# Patient Record
Sex: Female | Born: 1935 | Race: White | Hispanic: No | Marital: Married | Smoking: Never smoker
Health system: Southern US, Community
[De-identification: ages and names within clinical notes are randomized; demographics above are authoritative.]

## PROBLEM LIST (undated history)

## (undated) DIAGNOSIS — I4891 Unspecified atrial fibrillation: Secondary | ICD-10-CM

## (undated) DIAGNOSIS — H40059 Ocular hypertension, unspecified eye: Secondary | ICD-10-CM

## (undated) DIAGNOSIS — I809 Phlebitis and thrombophlebitis of unspecified site: Secondary | ICD-10-CM

## (undated) DIAGNOSIS — I82409 Acute embolism and thrombosis of unspecified deep veins of unspecified lower extremity: Secondary | ICD-10-CM

## (undated) DIAGNOSIS — K5790 Diverticulosis of intestine, part unspecified, without perforation or abscess without bleeding: Secondary | ICD-10-CM

## (undated) DIAGNOSIS — C4490 Unspecified malignant neoplasm of skin, unspecified: Secondary | ICD-10-CM

## (undated) DIAGNOSIS — K589 Irritable bowel syndrome without diarrhea: Secondary | ICD-10-CM

## (undated) DIAGNOSIS — S82892A Other fracture of left lower leg, initial encounter for closed fracture: Secondary | ICD-10-CM

## (undated) DIAGNOSIS — K2970 Gastritis, unspecified, without bleeding: Secondary | ICD-10-CM

## (undated) DIAGNOSIS — I10 Essential (primary) hypertension: Secondary | ICD-10-CM

## (undated) DIAGNOSIS — K219 Gastro-esophageal reflux disease without esophagitis: Secondary | ICD-10-CM

## (undated) DIAGNOSIS — K449 Diaphragmatic hernia without obstruction or gangrene: Secondary | ICD-10-CM

## (undated) DIAGNOSIS — R55 Syncope and collapse: Secondary | ICD-10-CM

## (undated) DIAGNOSIS — E785 Hyperlipidemia, unspecified: Secondary | ICD-10-CM

## (undated) DIAGNOSIS — D151 Benign neoplasm of heart: Secondary | ICD-10-CM

## (undated) DIAGNOSIS — I2510 Atherosclerotic heart disease of native coronary artery without angina pectoris: Secondary | ICD-10-CM

## (undated) DIAGNOSIS — K5792 Diverticulitis of intestine, part unspecified, without perforation or abscess without bleeding: Secondary | ICD-10-CM

## (undated) DIAGNOSIS — N390 Urinary tract infection, site not specified: Secondary | ICD-10-CM

## (undated) HISTORY — DX: Syncope and collapse: R55

## (undated) HISTORY — PX: COLONOSCOPY: SHX5424

## (undated) HISTORY — DX: Ocular hypertension, unspecified eye: H40.059

## (undated) HISTORY — DX: Essential (primary) hypertension: I10

## (undated) HISTORY — PX: CATARACT EXTRACTION W/ INTRAOCULAR LENS IMPLANT: SHX1309

## (undated) HISTORY — DX: Other fracture of left lower leg, initial encounter for closed fracture: S82.892A

## (undated) HISTORY — DX: Benign neoplasm of heart: D15.1

## (undated) HISTORY — DX: Unspecified atrial fibrillation: I48.91

## (undated) HISTORY — DX: Hyperlipidemia, unspecified: E78.5

## (undated) HISTORY — DX: Diaphragmatic hernia without obstruction or gangrene: K44.9

## (undated) HISTORY — DX: Diverticulosis of intestine, part unspecified, without perforation or abscess without bleeding: K57.90

## (undated) HISTORY — DX: Gastro-esophageal reflux disease without esophagitis: K21.9

## (undated) HISTORY — DX: Phlebitis and thrombophlebitis of unspecified site: I80.9

## (undated) HISTORY — DX: Gastritis, unspecified, without bleeding: K29.70

## (undated) HISTORY — PX: CATARACT EXTRACTION: SUR2

## (undated) HISTORY — DX: Unspecified malignant neoplasm of skin, unspecified: C44.90

## (undated) HISTORY — DX: Urinary tract infection, site not specified: N39.0

## (undated) HISTORY — PX: TONSILLECTOMY: SUR1361

## (undated) HISTORY — PX: VARICOSE VEIN SURGERY: SHX832

## (undated) HISTORY — DX: Atherosclerotic heart disease of native coronary artery without angina pectoris: I25.10

## (undated) HISTORY — DX: Diverticulitis of intestine, part unspecified, without perforation or abscess without bleeding: K57.92

## (undated) HISTORY — DX: Irritable bowel syndrome, unspecified: K58.9

---

## 1978-11-22 HISTORY — PX: TOTAL ABDOMINAL HYSTERECTOMY W/ BILATERAL SALPINGOOPHORECTOMY: SHX83

## 1998-09-22 ENCOUNTER — Encounter (INDEPENDENT_AMBULATORY_CARE_PROVIDER_SITE_OTHER): Payer: Self-pay | Admitting: *Deleted

## 1998-10-01 ENCOUNTER — Other Ambulatory Visit: Admission: RE | Admit: 1998-10-01 | Discharge: 1998-10-01 | Payer: Self-pay | Admitting: Obstetrics & Gynecology

## 1998-10-07 ENCOUNTER — Encounter: Payer: Self-pay | Admitting: Emergency Medicine

## 1998-10-07 ENCOUNTER — Emergency Department (HOSPITAL_COMMUNITY): Admission: EM | Admit: 1998-10-07 | Discharge: 1998-10-07 | Payer: Self-pay | Admitting: Emergency Medicine

## 1998-11-22 DIAGNOSIS — I82409 Acute embolism and thrombosis of unspecified deep veins of unspecified lower extremity: Secondary | ICD-10-CM

## 1998-11-22 HISTORY — DX: Acute embolism and thrombosis of unspecified deep veins of unspecified lower extremity: I82.409

## 1999-10-05 ENCOUNTER — Inpatient Hospital Stay (HOSPITAL_COMMUNITY): Admission: EM | Admit: 1999-10-05 | Discharge: 1999-10-08 | Payer: Self-pay | Admitting: Internal Medicine

## 1999-10-05 ENCOUNTER — Encounter: Payer: Self-pay | Admitting: Internal Medicine

## 1999-10-06 ENCOUNTER — Encounter: Payer: Self-pay | Admitting: Internal Medicine

## 1999-10-09 ENCOUNTER — Ambulatory Visit (HOSPITAL_COMMUNITY): Admission: RE | Admit: 1999-10-09 | Discharge: 1999-10-09 | Payer: Self-pay | Admitting: *Deleted

## 1999-10-09 ENCOUNTER — Encounter: Payer: Self-pay | Admitting: *Deleted

## 2000-08-08 ENCOUNTER — Other Ambulatory Visit: Admission: RE | Admit: 2000-08-08 | Discharge: 2000-08-08 | Payer: Self-pay | Admitting: Obstetrics and Gynecology

## 2000-11-22 DIAGNOSIS — K5792 Diverticulitis of intestine, part unspecified, without perforation or abscess without bleeding: Secondary | ICD-10-CM

## 2000-11-22 HISTORY — DX: Diverticulitis of intestine, part unspecified, without perforation or abscess without bleeding: K57.92

## 2001-01-09 ENCOUNTER — Ambulatory Visit (HOSPITAL_COMMUNITY): Admission: RE | Admit: 2001-01-09 | Discharge: 2001-01-09 | Payer: Self-pay | Admitting: Internal Medicine

## 2001-01-09 ENCOUNTER — Encounter: Payer: Self-pay | Admitting: Internal Medicine

## 2001-04-06 ENCOUNTER — Encounter: Payer: Self-pay | Admitting: Internal Medicine

## 2001-09-20 ENCOUNTER — Other Ambulatory Visit: Admission: RE | Admit: 2001-09-20 | Discharge: 2001-09-20 | Payer: Self-pay | Admitting: Obstetrics and Gynecology

## 2002-10-31 ENCOUNTER — Other Ambulatory Visit: Admission: RE | Admit: 2002-10-31 | Discharge: 2002-10-31 | Payer: Self-pay | Admitting: Obstetrics and Gynecology

## 2003-01-11 ENCOUNTER — Encounter: Admission: RE | Admit: 2003-01-11 | Discharge: 2003-01-11 | Payer: Self-pay | Admitting: Internal Medicine

## 2003-01-11 ENCOUNTER — Encounter: Payer: Self-pay | Admitting: Internal Medicine

## 2004-03-18 ENCOUNTER — Encounter: Admission: RE | Admit: 2004-03-18 | Discharge: 2004-03-18 | Payer: Self-pay | Admitting: Internal Medicine

## 2004-08-04 ENCOUNTER — Emergency Department (HOSPITAL_COMMUNITY): Admission: EM | Admit: 2004-08-04 | Discharge: 2004-08-04 | Payer: Self-pay | Admitting: Emergency Medicine

## 2004-10-02 ENCOUNTER — Ambulatory Visit: Payer: Self-pay | Admitting: Internal Medicine

## 2004-10-05 ENCOUNTER — Ambulatory Visit: Payer: Self-pay | Admitting: Cardiology

## 2004-11-02 ENCOUNTER — Ambulatory Visit: Payer: Self-pay | Admitting: Cardiovascular Disease

## 2004-11-22 DIAGNOSIS — K2970 Gastritis, unspecified, without bleeding: Secondary | ICD-10-CM

## 2004-11-22 HISTORY — DX: Gastritis, unspecified, without bleeding: K29.70

## 2004-11-24 ENCOUNTER — Ambulatory Visit: Payer: Self-pay | Admitting: Internal Medicine

## 2004-11-30 ENCOUNTER — Ambulatory Visit: Payer: Self-pay | Admitting: Cardiology

## 2004-11-30 ENCOUNTER — Ambulatory Visit: Payer: Self-pay | Admitting: Internal Medicine

## 2004-12-03 ENCOUNTER — Ambulatory Visit: Payer: Self-pay | Admitting: Internal Medicine

## 2004-12-30 ENCOUNTER — Ambulatory Visit: Payer: Self-pay | Admitting: Internal Medicine

## 2005-01-13 ENCOUNTER — Ambulatory Visit: Payer: Self-pay | Admitting: Internal Medicine

## 2005-01-14 ENCOUNTER — Ambulatory Visit: Payer: Self-pay | Admitting: Internal Medicine

## 2005-01-15 ENCOUNTER — Ambulatory Visit: Payer: Self-pay | Admitting: Internal Medicine

## 2005-01-27 ENCOUNTER — Ambulatory Visit: Payer: Self-pay | Admitting: Cardiology

## 2005-02-11 ENCOUNTER — Ambulatory Visit: Payer: Self-pay | Admitting: Internal Medicine

## 2005-02-24 ENCOUNTER — Ambulatory Visit: Payer: Self-pay | Admitting: Internal Medicine

## 2005-03-10 ENCOUNTER — Ambulatory Visit: Payer: Self-pay | Admitting: Internal Medicine

## 2005-04-07 ENCOUNTER — Ambulatory Visit: Payer: Self-pay | Admitting: Internal Medicine

## 2005-05-07 ENCOUNTER — Ambulatory Visit: Payer: Self-pay | Admitting: Internal Medicine

## 2005-05-31 ENCOUNTER — Ambulatory Visit: Payer: Self-pay | Admitting: Internal Medicine

## 2005-06-02 ENCOUNTER — Ambulatory Visit: Payer: Self-pay | Admitting: Internal Medicine

## 2005-06-30 ENCOUNTER — Ambulatory Visit: Payer: Self-pay | Admitting: Internal Medicine

## 2005-07-28 ENCOUNTER — Ambulatory Visit: Payer: Self-pay | Admitting: Internal Medicine

## 2005-08-25 ENCOUNTER — Ambulatory Visit: Payer: Self-pay | Admitting: Internal Medicine

## 2005-09-22 ENCOUNTER — Ambulatory Visit: Payer: Self-pay | Admitting: Internal Medicine

## 2005-10-06 ENCOUNTER — Ambulatory Visit: Payer: Self-pay | Admitting: Internal Medicine

## 2005-10-20 ENCOUNTER — Ambulatory Visit: Payer: Self-pay | Admitting: Internal Medicine

## 2005-11-17 ENCOUNTER — Ambulatory Visit: Payer: Self-pay | Admitting: Internal Medicine

## 2005-11-26 ENCOUNTER — Ambulatory Visit: Payer: Self-pay | Admitting: Internal Medicine

## 2005-12-15 ENCOUNTER — Ambulatory Visit: Payer: Self-pay | Admitting: Internal Medicine

## 2005-12-28 ENCOUNTER — Ambulatory Visit: Payer: Self-pay | Admitting: Internal Medicine

## 2006-01-25 ENCOUNTER — Ambulatory Visit: Payer: Self-pay | Admitting: Internal Medicine

## 2006-02-22 ENCOUNTER — Ambulatory Visit: Payer: Self-pay | Admitting: Internal Medicine

## 2006-03-18 ENCOUNTER — Ambulatory Visit: Payer: Self-pay | Admitting: Internal Medicine

## 2006-03-25 ENCOUNTER — Ambulatory Visit: Payer: Self-pay | Admitting: Internal Medicine

## 2006-04-11 ENCOUNTER — Ambulatory Visit: Payer: Self-pay | Admitting: Internal Medicine

## 2006-04-25 ENCOUNTER — Ambulatory Visit: Payer: Self-pay | Admitting: Internal Medicine

## 2006-04-27 ENCOUNTER — Ambulatory Visit: Payer: Self-pay | Admitting: Internal Medicine

## 2006-04-29 ENCOUNTER — Ambulatory Visit: Payer: Self-pay | Admitting: Internal Medicine

## 2006-05-05 ENCOUNTER — Ambulatory Visit: Payer: Self-pay | Admitting: Internal Medicine

## 2006-05-19 ENCOUNTER — Ambulatory Visit: Payer: Self-pay | Admitting: Internal Medicine

## 2006-06-17 ENCOUNTER — Ambulatory Visit: Payer: Self-pay | Admitting: Internal Medicine

## 2006-07-01 ENCOUNTER — Ambulatory Visit: Payer: Self-pay | Admitting: Internal Medicine

## 2006-08-01 ENCOUNTER — Ambulatory Visit: Payer: Self-pay | Admitting: Internal Medicine

## 2006-08-31 ENCOUNTER — Ambulatory Visit: Payer: Self-pay | Admitting: Internal Medicine

## 2006-09-14 ENCOUNTER — Ambulatory Visit: Payer: Self-pay | Admitting: Internal Medicine

## 2006-09-22 ENCOUNTER — Ambulatory Visit: Payer: Self-pay | Admitting: Internal Medicine

## 2006-09-30 ENCOUNTER — Ambulatory Visit: Payer: Self-pay | Admitting: Internal Medicine

## 2006-10-07 ENCOUNTER — Ambulatory Visit: Payer: Self-pay | Admitting: Internal Medicine

## 2006-10-21 ENCOUNTER — Ambulatory Visit: Payer: Self-pay | Admitting: Internal Medicine

## 2006-11-11 ENCOUNTER — Ambulatory Visit: Payer: Self-pay | Admitting: Internal Medicine

## 2006-12-01 ENCOUNTER — Ambulatory Visit: Payer: Self-pay | Admitting: Internal Medicine

## 2006-12-01 DIAGNOSIS — Z8719 Personal history of other diseases of the digestive system: Secondary | ICD-10-CM | POA: Insufficient documentation

## 2006-12-01 DIAGNOSIS — I8000 Phlebitis and thrombophlebitis of superficial vessels of unspecified lower extremity: Secondary | ICD-10-CM | POA: Insufficient documentation

## 2006-12-01 DIAGNOSIS — E7800 Pure hypercholesterolemia, unspecified: Secondary | ICD-10-CM | POA: Insufficient documentation

## 2006-12-01 DIAGNOSIS — I10 Essential (primary) hypertension: Secondary | ICD-10-CM | POA: Insufficient documentation

## 2006-12-01 DIAGNOSIS — Z86718 Personal history of other venous thrombosis and embolism: Secondary | ICD-10-CM | POA: Insufficient documentation

## 2006-12-01 DIAGNOSIS — E785 Hyperlipidemia, unspecified: Secondary | ICD-10-CM | POA: Insufficient documentation

## 2006-12-12 ENCOUNTER — Ambulatory Visit: Payer: Self-pay | Admitting: Internal Medicine

## 2006-12-28 ENCOUNTER — Ambulatory Visit: Payer: Self-pay | Admitting: Internal Medicine

## 2007-01-06 ENCOUNTER — Ambulatory Visit: Payer: Self-pay | Admitting: Internal Medicine

## 2007-01-20 ENCOUNTER — Ambulatory Visit: Payer: Self-pay | Admitting: Internal Medicine

## 2007-02-22 ENCOUNTER — Ambulatory Visit: Payer: Self-pay | Admitting: Internal Medicine

## 2007-02-23 ENCOUNTER — Ambulatory Visit: Payer: Self-pay | Admitting: Internal Medicine

## 2007-02-24 ENCOUNTER — Ambulatory Visit: Payer: Self-pay | Admitting: Internal Medicine

## 2007-02-27 ENCOUNTER — Ambulatory Visit: Payer: Self-pay | Admitting: Internal Medicine

## 2007-03-13 ENCOUNTER — Ambulatory Visit: Payer: Self-pay | Admitting: Internal Medicine

## 2007-04-14 ENCOUNTER — Ambulatory Visit: Payer: Self-pay | Admitting: Internal Medicine

## 2007-04-28 ENCOUNTER — Ambulatory Visit: Payer: Self-pay | Admitting: Internal Medicine

## 2007-04-28 LAB — CONVERTED CEMR LAB
INR range: 2
Prothrombin Time: 20.4 s

## 2007-05-22 ENCOUNTER — Ambulatory Visit: Payer: Self-pay | Admitting: Internal Medicine

## 2007-05-22 LAB — CONVERTED CEMR LAB
INR: 1.8
Prothrombin Time: 16.3 s

## 2007-06-05 ENCOUNTER — Ambulatory Visit: Payer: Self-pay | Admitting: Internal Medicine

## 2007-06-05 LAB — CONVERTED CEMR LAB: Prothrombin Time: 20.7 s

## 2007-06-08 ENCOUNTER — Encounter: Payer: Self-pay | Admitting: Internal Medicine

## 2007-06-13 ENCOUNTER — Ambulatory Visit: Payer: Self-pay | Admitting: Internal Medicine

## 2007-06-13 ENCOUNTER — Encounter: Payer: Self-pay | Admitting: Internal Medicine

## 2007-06-23 ENCOUNTER — Ambulatory Visit: Payer: Self-pay | Admitting: Internal Medicine

## 2007-07-03 ENCOUNTER — Encounter (INDEPENDENT_AMBULATORY_CARE_PROVIDER_SITE_OTHER): Payer: Self-pay | Admitting: *Deleted

## 2007-07-07 ENCOUNTER — Ambulatory Visit: Payer: Self-pay | Admitting: Internal Medicine

## 2007-07-07 LAB — CONVERTED CEMR LAB: Prothrombin Time: 18.5 s

## 2007-07-17 ENCOUNTER — Ambulatory Visit: Payer: Self-pay | Admitting: Internal Medicine

## 2007-07-19 ENCOUNTER — Ambulatory Visit: Payer: Self-pay | Admitting: Internal Medicine

## 2007-07-20 ENCOUNTER — Encounter (INDEPENDENT_AMBULATORY_CARE_PROVIDER_SITE_OTHER): Payer: Self-pay | Admitting: *Deleted

## 2007-07-27 ENCOUNTER — Ambulatory Visit: Payer: Self-pay | Admitting: Internal Medicine

## 2007-08-17 ENCOUNTER — Ambulatory Visit: Payer: Self-pay | Admitting: Internal Medicine

## 2007-08-31 ENCOUNTER — Ambulatory Visit: Payer: Self-pay | Admitting: Internal Medicine

## 2007-08-31 LAB — CONVERTED CEMR LAB: INR: 2.6

## 2007-09-08 ENCOUNTER — Ambulatory Visit: Payer: Self-pay | Admitting: Internal Medicine

## 2007-09-28 ENCOUNTER — Ambulatory Visit: Payer: Self-pay | Admitting: Internal Medicine

## 2007-10-26 ENCOUNTER — Encounter (INDEPENDENT_AMBULATORY_CARE_PROVIDER_SITE_OTHER): Payer: Self-pay | Admitting: *Deleted

## 2007-10-26 ENCOUNTER — Ambulatory Visit: Payer: Self-pay | Admitting: Internal Medicine

## 2007-11-21 ENCOUNTER — Ambulatory Visit: Payer: Self-pay | Admitting: Internal Medicine

## 2007-12-05 ENCOUNTER — Ambulatory Visit: Payer: Self-pay | Admitting: Internal Medicine

## 2007-12-19 ENCOUNTER — Ambulatory Visit: Payer: Self-pay | Admitting: Internal Medicine

## 2008-01-02 ENCOUNTER — Ambulatory Visit: Payer: Self-pay | Admitting: Internal Medicine

## 2008-01-02 LAB — CONVERTED CEMR LAB: INR: 3.4

## 2008-01-16 ENCOUNTER — Ambulatory Visit: Payer: Self-pay | Admitting: Internal Medicine

## 2008-02-02 ENCOUNTER — Ambulatory Visit: Payer: Self-pay | Admitting: Internal Medicine

## 2008-02-20 DIAGNOSIS — M1990 Unspecified osteoarthritis, unspecified site: Secondary | ICD-10-CM | POA: Insufficient documentation

## 2008-02-20 DIAGNOSIS — K219 Gastro-esophageal reflux disease without esophagitis: Secondary | ICD-10-CM | POA: Insufficient documentation

## 2008-02-20 DIAGNOSIS — K589 Irritable bowel syndrome without diarrhea: Secondary | ICD-10-CM | POA: Insufficient documentation

## 2008-02-28 ENCOUNTER — Ambulatory Visit: Payer: Self-pay | Admitting: Internal Medicine

## 2008-02-28 LAB — CONVERTED CEMR LAB: INR: 2.3

## 2008-03-05 ENCOUNTER — Telehealth (INDEPENDENT_AMBULATORY_CARE_PROVIDER_SITE_OTHER): Payer: Self-pay | Admitting: *Deleted

## 2008-03-13 ENCOUNTER — Ambulatory Visit: Payer: Self-pay | Admitting: Internal Medicine

## 2008-03-13 DIAGNOSIS — I8390 Asymptomatic varicose veins of unspecified lower extremity: Secondary | ICD-10-CM | POA: Insufficient documentation

## 2008-03-13 LAB — CONVERTED CEMR LAB: INR: 3.8

## 2008-04-04 ENCOUNTER — Ambulatory Visit: Payer: Self-pay | Admitting: Internal Medicine

## 2008-04-04 LAB — CONVERTED CEMR LAB
INR: 4.6
Prothrombin Time: 26.1 s

## 2008-04-09 ENCOUNTER — Encounter: Payer: Self-pay | Admitting: Internal Medicine

## 2008-04-09 ENCOUNTER — Ambulatory Visit: Payer: Self-pay | Admitting: Vascular Surgery

## 2008-04-17 ENCOUNTER — Ambulatory Visit: Payer: Self-pay | Admitting: Internal Medicine

## 2008-04-17 LAB — CONVERTED CEMR LAB
INR: 1.8
Prothrombin Time: 16.4 s

## 2008-05-15 ENCOUNTER — Ambulatory Visit: Payer: Self-pay | Admitting: Internal Medicine

## 2008-05-15 LAB — CONVERTED CEMR LAB: INR: 3.6

## 2008-06-05 ENCOUNTER — Ambulatory Visit: Payer: Self-pay | Admitting: Internal Medicine

## 2008-06-05 LAB — CONVERTED CEMR LAB: INR: 4.1

## 2008-06-14 ENCOUNTER — Ambulatory Visit: Payer: Self-pay | Admitting: Internal Medicine

## 2008-06-14 LAB — CONVERTED CEMR LAB
INR: 3.1
Rapid Strep: NEGATIVE

## 2008-06-25 ENCOUNTER — Ambulatory Visit: Payer: Self-pay | Admitting: Internal Medicine

## 2008-06-25 ENCOUNTER — Telehealth (INDEPENDENT_AMBULATORY_CARE_PROVIDER_SITE_OTHER): Payer: Self-pay | Admitting: *Deleted

## 2008-06-26 ENCOUNTER — Telehealth: Payer: Self-pay | Admitting: Internal Medicine

## 2008-06-27 ENCOUNTER — Encounter (INDEPENDENT_AMBULATORY_CARE_PROVIDER_SITE_OTHER): Payer: Self-pay | Admitting: *Deleted

## 2008-06-27 LAB — CONVERTED CEMR LAB
Basophils Relative: 0 % (ref 0.0–3.0)
Eosinophils Absolute: 0.1 10*3/uL (ref 0.0–0.7)
HCT: 43.3 % (ref 36.0–46.0)
Hemoglobin: 15.1 g/dL — ABNORMAL HIGH (ref 12.0–15.0)
Lymphocytes Relative: 8.5 % — ABNORMAL LOW (ref 12.0–46.0)
MCV: 87.9 fL (ref 78.0–100.0)
Monocytes Relative: 1.3 % — ABNORMAL LOW (ref 3.0–12.0)
Potassium: 3.9 meq/L (ref 3.5–5.1)
Prothrombin Time: 28.9 s — ABNORMAL HIGH (ref 10.9–13.3)
RBC: 4.93 M/uL (ref 3.87–5.11)

## 2008-07-02 ENCOUNTER — Telehealth: Payer: Self-pay | Admitting: Internal Medicine

## 2008-07-10 ENCOUNTER — Ambulatory Visit: Payer: Self-pay | Admitting: Internal Medicine

## 2008-07-10 LAB — CONVERTED CEMR LAB: INR: 2.9

## 2008-07-17 ENCOUNTER — Encounter: Payer: Self-pay | Admitting: Internal Medicine

## 2008-07-31 ENCOUNTER — Ambulatory Visit: Payer: Self-pay | Admitting: Internal Medicine

## 2008-07-31 LAB — CONVERTED CEMR LAB: INR: 2.7

## 2008-08-29 ENCOUNTER — Ambulatory Visit: Payer: Self-pay | Admitting: Internal Medicine

## 2008-08-29 LAB — CONVERTED CEMR LAB: INR: 2.3

## 2008-09-09 ENCOUNTER — Ambulatory Visit: Payer: Self-pay | Admitting: Internal Medicine

## 2008-09-24 ENCOUNTER — Ambulatory Visit: Payer: Self-pay | Admitting: Internal Medicine

## 2008-09-24 DIAGNOSIS — R011 Cardiac murmur, unspecified: Secondary | ICD-10-CM | POA: Insufficient documentation

## 2008-09-24 LAB — CONVERTED CEMR LAB
INR: 3.7
Prothrombin Time: 23.2 s

## 2008-09-27 ENCOUNTER — Ambulatory Visit: Payer: Self-pay | Admitting: Internal Medicine

## 2008-09-27 LAB — CONVERTED CEMR LAB: INR: 1.2

## 2008-09-30 ENCOUNTER — Telehealth (INDEPENDENT_AMBULATORY_CARE_PROVIDER_SITE_OTHER): Payer: Self-pay | Admitting: *Deleted

## 2008-09-30 ENCOUNTER — Ambulatory Visit: Payer: Self-pay | Admitting: Internal Medicine

## 2008-10-01 ENCOUNTER — Telehealth (INDEPENDENT_AMBULATORY_CARE_PROVIDER_SITE_OTHER): Payer: Self-pay | Admitting: *Deleted

## 2008-10-14 ENCOUNTER — Ambulatory Visit: Payer: Self-pay | Admitting: Vascular Surgery

## 2008-10-31 ENCOUNTER — Ambulatory Visit: Payer: Self-pay | Admitting: Internal Medicine

## 2008-10-31 DIAGNOSIS — K5730 Diverticulosis of large intestine without perforation or abscess without bleeding: Secondary | ICD-10-CM | POA: Insufficient documentation

## 2008-11-06 ENCOUNTER — Encounter (INDEPENDENT_AMBULATORY_CARE_PROVIDER_SITE_OTHER): Payer: Self-pay | Admitting: *Deleted

## 2008-11-25 ENCOUNTER — Ambulatory Visit: Payer: Self-pay | Admitting: Vascular Surgery

## 2008-12-02 ENCOUNTER — Ambulatory Visit: Payer: Self-pay | Admitting: Vascular Surgery

## 2008-12-09 ENCOUNTER — Ambulatory Visit: Payer: Self-pay | Admitting: Internal Medicine

## 2008-12-20 ENCOUNTER — Ambulatory Visit: Payer: Self-pay | Admitting: Family Medicine

## 2008-12-20 ENCOUNTER — Telehealth: Payer: Self-pay | Admitting: Family Medicine

## 2008-12-20 LAB — CONVERTED CEMR LAB: Rapid Strep: NEGATIVE

## 2008-12-24 ENCOUNTER — Telehealth: Payer: Self-pay | Admitting: Family Medicine

## 2009-01-06 ENCOUNTER — Ambulatory Visit: Payer: Self-pay | Admitting: Vascular Surgery

## 2009-01-13 ENCOUNTER — Ambulatory Visit: Payer: Self-pay | Admitting: Internal Medicine

## 2009-01-15 ENCOUNTER — Ambulatory Visit: Payer: Self-pay | Admitting: Vascular Surgery

## 2009-01-27 ENCOUNTER — Ambulatory Visit: Payer: Self-pay | Admitting: Vascular Surgery

## 2009-02-05 ENCOUNTER — Telehealth (INDEPENDENT_AMBULATORY_CARE_PROVIDER_SITE_OTHER): Payer: Self-pay | Admitting: *Deleted

## 2009-03-11 ENCOUNTER — Ambulatory Visit: Payer: Self-pay | Admitting: Internal Medicine

## 2009-03-11 LAB — CONVERTED CEMR LAB
Blood in Urine, dipstick: NEGATIVE
Glucose, Urine, Semiquant: NEGATIVE
Nitrite: POSITIVE
Specific Gravity, Urine: 1.01

## 2009-03-12 ENCOUNTER — Encounter: Payer: Self-pay | Admitting: Internal Medicine

## 2009-03-14 ENCOUNTER — Encounter (INDEPENDENT_AMBULATORY_CARE_PROVIDER_SITE_OTHER): Payer: Self-pay | Admitting: *Deleted

## 2009-04-28 ENCOUNTER — Ambulatory Visit: Payer: Self-pay | Admitting: Vascular Surgery

## 2009-05-27 ENCOUNTER — Ambulatory Visit: Payer: Self-pay | Admitting: Internal Medicine

## 2009-06-11 ENCOUNTER — Telehealth: Payer: Self-pay | Admitting: Internal Medicine

## 2009-06-12 ENCOUNTER — Ambulatory Visit: Payer: Self-pay | Admitting: Internal Medicine

## 2009-06-13 ENCOUNTER — Encounter (INDEPENDENT_AMBULATORY_CARE_PROVIDER_SITE_OTHER): Payer: Self-pay | Admitting: *Deleted

## 2009-08-06 ENCOUNTER — Ambulatory Visit: Payer: Self-pay | Admitting: Internal Medicine

## 2009-08-06 LAB — CONVERTED CEMR LAB
Specific Gravity, Urine: <1.005
pH: 7

## 2009-08-08 ENCOUNTER — Telehealth (INDEPENDENT_AMBULATORY_CARE_PROVIDER_SITE_OTHER): Payer: Self-pay | Admitting: *Deleted

## 2009-09-08 ENCOUNTER — Ambulatory Visit: Payer: Self-pay | Admitting: Internal Medicine

## 2009-09-18 ENCOUNTER — Encounter: Payer: Self-pay | Admitting: Internal Medicine

## 2009-09-18 ENCOUNTER — Telehealth (INDEPENDENT_AMBULATORY_CARE_PROVIDER_SITE_OTHER): Payer: Self-pay | Admitting: *Deleted

## 2009-09-26 ENCOUNTER — Encounter: Payer: Self-pay | Admitting: Internal Medicine

## 2009-09-26 ENCOUNTER — Ambulatory Visit: Payer: Self-pay | Admitting: Internal Medicine

## 2009-10-13 ENCOUNTER — Encounter (INDEPENDENT_AMBULATORY_CARE_PROVIDER_SITE_OTHER): Payer: Self-pay | Admitting: *Deleted

## 2009-11-12 ENCOUNTER — Ambulatory Visit: Payer: Self-pay | Admitting: Internal Medicine

## 2009-11-18 ENCOUNTER — Ambulatory Visit: Payer: Self-pay | Admitting: Internal Medicine

## 2009-11-18 DIAGNOSIS — Z85828 Personal history of other malignant neoplasm of skin: Secondary | ICD-10-CM | POA: Insufficient documentation

## 2009-11-19 ENCOUNTER — Telehealth (INDEPENDENT_AMBULATORY_CARE_PROVIDER_SITE_OTHER): Payer: Self-pay | Admitting: *Deleted

## 2009-11-19 LAB — CONVERTED CEMR LAB
ALT: 21 units/L (ref 0–35)
BUN: 22 mg/dL (ref 6–23)
Bilirubin, Direct: 0 mg/dL (ref 0.0–0.3)
CO2: 30 meq/L (ref 19–32)
Calcium: 10.2 mg/dL (ref 8.4–10.5)
Chloride: 105 meq/L (ref 96–112)
Creatinine, Ser: 0.9 mg/dL (ref 0.4–1.2)
Eosinophils Absolute: 0.1 10*3/uL (ref 0.0–0.7)
Glucose, Bld: 88 mg/dL (ref 70–99)
HCT: 41.7 % (ref 36.0–46.0)
LDL Cholesterol: 105 mg/dL — ABNORMAL HIGH (ref 0–99)
Lymphs Abs: 1.6 10*3/uL (ref 0.7–4.0)
MCV: 91.8 fL (ref 78.0–100.0)
Monocytes Relative: 7.7 % (ref 3.0–12.0)
Potassium: 5 meq/L (ref 3.5–5.1)
Sodium: 144 meq/L (ref 135–145)
TSH: 1.67 microintl units/mL (ref 0.35–5.50)
Total Bilirubin: 0.8 mg/dL (ref 0.3–1.2)
Triglycerides: 54 mg/dL (ref 0.0–149.0)

## 2009-11-20 ENCOUNTER — Encounter (INDEPENDENT_AMBULATORY_CARE_PROVIDER_SITE_OTHER): Payer: Self-pay | Admitting: *Deleted

## 2009-12-08 ENCOUNTER — Ambulatory Visit: Payer: Self-pay | Admitting: Internal Medicine

## 2009-12-11 ENCOUNTER — Ambulatory Visit: Payer: Self-pay | Admitting: Internal Medicine

## 2009-12-11 LAB — CONVERTED CEMR LAB
OCCULT 1: NEGATIVE
OCCULT 2: NEGATIVE

## 2009-12-12 ENCOUNTER — Encounter (INDEPENDENT_AMBULATORY_CARE_PROVIDER_SITE_OTHER): Payer: Self-pay | Admitting: *Deleted

## 2009-12-24 ENCOUNTER — Telehealth: Payer: Self-pay | Admitting: Internal Medicine

## 2009-12-26 ENCOUNTER — Encounter: Payer: Self-pay | Admitting: Internal Medicine

## 2010-02-13 ENCOUNTER — Ambulatory Visit: Payer: Self-pay | Admitting: Family Medicine

## 2010-02-23 ENCOUNTER — Ambulatory Visit: Payer: Self-pay | Admitting: Internal Medicine

## 2010-02-23 ENCOUNTER — Telehealth (INDEPENDENT_AMBULATORY_CARE_PROVIDER_SITE_OTHER): Payer: Self-pay | Admitting: *Deleted

## 2010-02-23 DIAGNOSIS — R490 Dysphonia: Secondary | ICD-10-CM | POA: Insufficient documentation

## 2010-03-02 LAB — CONVERTED CEMR LAB
Basophils Absolute: 0 10*3/uL (ref 0.0–0.1)
Eosinophils Absolute: 0 10*3/uL (ref 0.0–0.7)
Eosinophils Relative: 0.1 % (ref 0.0–5.0)
Hemoglobin: 13 g/dL (ref 12.0–15.0)
Monocytes Absolute: 0.2 10*3/uL (ref 0.1–1.0)
Monocytes Relative: 2.9 % — ABNORMAL LOW (ref 3.0–12.0)
Neutro Abs: 3.7 10*3/uL (ref 1.4–7.7)
RBC: 4.35 M/uL (ref 3.87–5.11)

## 2010-03-26 ENCOUNTER — Ambulatory Visit: Payer: Self-pay | Admitting: Internal Medicine

## 2010-03-26 ENCOUNTER — Encounter (INDEPENDENT_AMBULATORY_CARE_PROVIDER_SITE_OTHER): Payer: Self-pay | Admitting: *Deleted

## 2010-04-16 ENCOUNTER — Ambulatory Visit: Payer: Self-pay | Admitting: Internal Medicine

## 2010-04-17 ENCOUNTER — Encounter: Payer: Self-pay | Admitting: Internal Medicine

## 2010-06-04 ENCOUNTER — Telehealth (INDEPENDENT_AMBULATORY_CARE_PROVIDER_SITE_OTHER): Payer: Self-pay | Admitting: *Deleted

## 2010-06-19 ENCOUNTER — Telehealth (INDEPENDENT_AMBULATORY_CARE_PROVIDER_SITE_OTHER): Payer: Self-pay | Admitting: *Deleted

## 2010-08-14 ENCOUNTER — Ambulatory Visit: Payer: Self-pay | Admitting: Internal Medicine

## 2010-09-02 ENCOUNTER — Ambulatory Visit: Payer: Self-pay | Admitting: Internal Medicine

## 2010-09-02 DIAGNOSIS — M79609 Pain in unspecified limb: Secondary | ICD-10-CM | POA: Insufficient documentation

## 2010-09-04 LAB — CONVERTED CEMR LAB: Uric Acid, Serum: 5.6 mg/dL (ref 2.4–7.0)

## 2010-09-22 ENCOUNTER — Encounter: Payer: Self-pay | Admitting: Internal Medicine

## 2010-10-09 ENCOUNTER — Ambulatory Visit: Payer: Self-pay | Admitting: Internal Medicine

## 2010-10-09 DIAGNOSIS — L0390 Cellulitis, unspecified: Secondary | ICD-10-CM

## 2010-10-09 DIAGNOSIS — L0291 Cutaneous abscess, unspecified: Secondary | ICD-10-CM | POA: Insufficient documentation

## 2010-11-09 ENCOUNTER — Encounter: Payer: Self-pay | Admitting: Internal Medicine

## 2010-11-26 ENCOUNTER — Ambulatory Visit
Admission: RE | Admit: 2010-11-26 | Discharge: 2010-11-26 | Payer: Self-pay | Source: Home / Self Care | Attending: Internal Medicine | Admitting: Internal Medicine

## 2010-11-26 DIAGNOSIS — J069 Acute upper respiratory infection, unspecified: Secondary | ICD-10-CM | POA: Insufficient documentation

## 2010-11-26 LAB — CONVERTED CEMR LAB: Rapid Strep: NEGATIVE

## 2010-12-20 LAB — CONVERTED CEMR LAB
ALT: 20 units/L (ref 0–35)
Albumin: 4.2 g/dL (ref 3.5–5.2)
Alkaline Phosphatase: 62 units/L (ref 39–117)
BUN: 30 mg/dL — ABNORMAL HIGH (ref 6–23)
BUN: 31 mg/dL — ABNORMAL HIGH (ref 6–23)
Basophils Absolute: 0 10*3/uL (ref 0.0–0.1)
Basophils Relative: 0.2 % (ref 0.0–3.0)
CO2: 30 meq/L (ref 19–32)
CO2: 30 meq/L (ref 19–32)
Calcium: 9.6 mg/dL (ref 8.4–10.5)
Calcium: 9.8 mg/dL (ref 8.4–10.5)
Chloride: 100 meq/L (ref 96–112)
Cholesterol, target level: 200 mg/dL
Cholesterol: 228 mg/dL (ref 0–200)
Cholesterol: 233 mg/dL (ref 0–200)
Creatinine, Ser: 0.9 mg/dL (ref 0.4–1.2)
Creatinine, Ser: 1.1 mg/dL (ref 0.4–1.2)
Direct LDL: 122.2 mg/dL
Eosinophils Absolute: 0 10*3/uL (ref 0.0–0.6)
Eosinophils Relative: 1.8 % (ref 0.0–5.0)
GFR calc Af Amer: 80 mL/min
GFR calc non Af Amer: 66 mL/min
Glucose, Bld: 92 mg/dL (ref 70–99)
Glucose, Bld: 94 mg/dL (ref 70–99)
HCT: 40.6 % (ref 36.0–46.0)
LDL Goal: 150 mg/dL
Lymphocytes Relative: 24.2 % (ref 12.0–46.0)
Lymphocytes Relative: 32.1 % (ref 12.0–46.0)
MCHC: 34.4 g/dL (ref 30.0–36.0)
MCV: 88.3 fL (ref 78.0–100.0)
Monocytes Absolute: 0.4 10*3/uL (ref 0.1–1.0)
Monocytes Relative: 6.5 % (ref 3.0–11.0)
Monocytes Relative: 7.6 % (ref 3.0–12.0)
Neutro Abs: 3.2 10*3/uL (ref 1.4–7.7)
Neutro Abs: 3.7 10*3/uL (ref 1.4–7.7)
Neutrophils Relative %: 68.2 % (ref 43.0–77.0)
Platelets: 244 10*3/uL (ref 150–400)
Potassium: 3.5 meq/L (ref 3.5–5.1)
Potassium: 4.2 meq/L (ref 3.5–5.1)
RBC: 4.61 M/uL (ref 3.87–5.11)
Sodium: 139 meq/L (ref 135–145)
Sodium: 141 meq/L (ref 135–145)
TSH: 1.52 microintl units/mL (ref 0.35–5.50)
TSH: 2.18 microintl units/mL (ref 0.35–5.50)
Total Bilirubin: 0.9 mg/dL (ref 0.3–1.2)
Total Bilirubin: 1 mg/dL (ref 0.3–1.2)
Total Protein: 6.9 g/dL (ref 6.0–8.3)
Triglycerides: 66 mg/dL (ref 0–149)
Triglycerides: 75 mg/dL (ref 0–149)
VLDL: 13 mg/dL (ref 0–40)

## 2010-12-22 NOTE — Assessment & Plan Note (Signed)
Summary: head congestion//fd   Vital Signs:  Patient profile:   75 year old female Weight:      160.4 pounds Temp:     98.3 degrees F oral Pulse rate:   60 / minute Resp:     16 per minute BP sitting:   122 / 70  (left arm) Cuff size:   large  Vitals Entered By: Shonna Chock (December 08, 2009 3:29 PM) CC: Head cold off/ on x several weeks: cough,sore throat and drainage Comments REVIEWED MED LIST, PATIENT AGREED DOSE AND INSTRUCTION CORRECT    Primary Care Provider:  Marga Melnick, MD  CC:  Head cold off/ on x several weeks: cough and sore throat and drainage.  History of Present Illness: Chest congestion with N/P cough since generic Ceftin finished. Purulence resolved with that antibiotic. Rx: lozenges. Advair has helped cough in past with respiratory infections. RAD with infections.  Allergies: 1)  ! Fosamax 2)  ! * Altace 3)  ! Oxybutynin Chloride (Oxybutynin Chloride) 4)  ! Darvocet  Review of Systems General:  Denies fever and sweats. ENT:  Denies earache, nasal congestion, and sinus pressure; No facial pain, frontal headache, or purulence. Resp:  Complains of cough and wheezing; denies shortness of breath and sputum productive.  Physical Exam  General:  well-nourished,in no acute distress; alert,appropriate and cooperative throughout examination Ears:  External ear exam shows no significant lesions or deformities.  Otoscopic examination reveals clear canals, tympanic membranes are intact bilaterally without bulging, retraction, inflammation or discharge. Hearing is grossly normal bilaterally. Nose:  External nasal examination shows no deformity or inflammation. Nasal mucosa are pink and moist without lesions or exudates. Mouth:  Oral mucosa and oropharynx without lesions or exudates.  Teeth in good repair. Lungs:  Normal respiratory effort, chest expands symmetrically. Lungs : minor  wheezes. Cervical Nodes:  No lymphadenopathy noted Axillary Nodes:  No palpable  lymphadenopathy   Impression & Recommendations:  Problem # 1:  BRONCHITIS-ACUTE (ICD-466.0)  The following medications were removed from the medication list:    Cefuroxime Axetil 500 Mg Tabs (Cefuroxime axetil) .Marland Kitchen... 1 two times a day Her updated medication list for this problem includes:    Azithromycin 250 Mg Tabs (Azithromycin) .Marland Kitchen... As per pack  Complete Medication List: 1)  Triamterene-hctz 75-50 Mg Tabs (Triamterene-hctz) .... 1/2 once daily 2)  Travatan 0.004 % Soln (Travoprost) .Marland Kitchen.. 1 drop in each eye daily 3)  Fexofenadine Hcl 180 Mg Tabs (Fexofenadine hcl) .... Prn 4)  Omeprazole 20 Mg Tbec (Omeprazole) .Marland Kitchen.. 1 q am 5)  Bayer Aspirin 325 Mg Tabs (Aspirin) .Marland Kitchen.. 1 by mouth once daily 6)  Align Caps (Misc intestinal flora regulat) .Marland Kitchen.. 1 by mouth q d 7)  Levbid 0.375 Mg Xr12h-tab (Hyoscyamine sulfate) .Marland Kitchen.. 1 tablet by mouth two times a day as needed 8)  Losartan Potassium 100 Mg Tabs (Losartan potassium) .Marland Kitchen.. 1 once daily inplace of diovan 9)  Azithromycin 250 Mg Tabs (Azithromycin) .... As per pack  Patient Instructions: 1)  Advair inhalations every 12 hrs. 2)  Drink as much fluid as you can tolerate for the next few days. Prescriptions: AZITHROMYCIN 250 MG TABS (AZITHROMYCIN) as per pack  #1 x 0   Entered and Authorized by:   Marga Melnick MD   Signed by:   Marga Melnick MD on 12/08/2009   Method used:   Printed then faxed to ...       [facility] (retail)       [phone] [facility] [address]  [place], [place]  [address]       Ph: [phone]       Fax: [phone]   RxID:   [id number]

## 2010-12-22 NOTE — Progress Notes (Signed)
Summary: Triage: B/P med concerns-lm 7/14  Phone Note Call from Patient Call back at Home Phone [phone]   Caller: Patient Summary of Call: Message left on VM: Patient calling about B/P med, patient taking Losartan and not doing as well as she was when taking Diovan. Patient with fluid retention in her ankles. Patient is out of Losartan and wonders if Dr.Hopper would allow her to have samples of Diovan and see if it really makes a difference in her symptoms.  Dr.Hopper please advise, and forward to [facility] triage  [facility] CMA  June 04, 2010 11:45 AM   Follow-up for Phone Call        Give 2 weeks of Diovan 160 samples as trial Follow-up by: Marga Melnick MD,  June 04, 2010 1:24 PM  Additional Follow-up for Phone Call Additional follow up Details #1::        Left message on machine to call back to office. Lucious Groves CMA  June 04, 2010 2:27 PM     Additional Follow-up for Phone Call Additional follow up Details #2::    Spoke with patient, patient will pick up samples. Shonna Chock CMA  June 04, 2010 3:28 PM   New/Updated Medications: VITAMIN B-12 100 MCG TABS (CYANOCOBALAMIN) 1 by mouth once daily

## 2010-12-22 NOTE — Progress Notes (Signed)
Summary: med change   Phone Note Call from Patient Call back at Home Phone [phone]   Caller: Patient Call For: Dr. Marina Goodell Reason for Call: Talk to Nurse Summary of Call: Hyomax is no longer covered by pt's ins and she needs something else Initial call taken by: Vallarie Mare,  December 24, 2009 1:13 PM  Follow-up for Phone Call         Pt. will contact insurance to see what med. they will pay for and call back.    Teryl Lucy RN  December 25, 2009 9:27 AM  Med. Gilman Buttner. made by Dr.Perry and faxed to pharmacy per Timoteo Ace. Follow-up by: Teryl Lucy RN,  December 25, 2009 9:27 AM

## 2010-12-22 NOTE — Assessment & Plan Note (Signed)
Summary: bump near elbow/cbs   Vital Signs:  Patient profile:   75 year old female Weight:      174 pounds BMI:     29.06 Temp:     98.6 degrees F oral Pulse rate:   72 / minute BP sitting:   130 / 78  (left arm) Cuff size:   large  Vitals Entered By: Shonna Chock CMA (October 09, 2010 1:55 PM) CC: Red raised area on left elbow , Rash   Primary Care Provider:  Marga Melnick, MD  CC:  Red raised area on left elbow  and Rash.  History of Present Illness:      This is a 75 year old woman who presents with  a  pustule on the left forearm.  It began as a red area with increased temp 11/15. The patient denies the following symptoms: fever, chills or sweats.  The patient denies history of recent tick bite and other insect bite.  No PMH or FH of MRSA. Rx: none  Current Medications (verified): 1)  Latanoprost 0.005 % Soln (Latanoprost) .Marland Kitchen.. 1 Drop in Each Eye Daily 2)  Fexofenadine Hcl 180 Mg  Tabs (Fexofenadine Hcl) .... Prn 3)  Omeprazole 20 Mg Tbec (Omeprazole) .Marland Kitchen.. 1 Q Am 4)  Bayer Aspirin 325 Mg Tabs (Aspirin) .Marland Kitchen.. 1 By Mouth Once Daily 5)  Align  Caps (Misc Intestinal Flora Regulat) .Marland Kitchen.. 1 By Mouth Q D 6)  Losartan Potassium 100 Mg Tabs (Losartan Potassium) .Marland Kitchen.. 1 By Mouth Once Daily 7)  Dicyclomine Hcl 20 Mg Tabs (Dicyclomine Hcl) .Marland Kitchen.. 1 By Mouth Every 6 Hours As Needed 8)  Singulair 10 Mg Tabs (Montelukast Sodium) .Marland Kitchen.. 1 Once Daily 9)  Combivent 18-103 Mcg/act Aero (Ipratropium-Albuterol) .Marland Kitchen.. 1-2 Puffs Every 4-6 Hrs As Needed Cough/ Shortness of Breath 10)  Liquid Coq10 .... 2 Teaspoons Two Times A Day 11)  Furosemide 20 Mg Tabs (Furosemide) .Marland Kitchen.. 1 Once Daily As Needed Swelling  Allergies: 1)  ! Fosamax 2)  ! * Altace 3)  ! Oxybutynin Chloride (Oxybutynin Chloride) 4)  ! Darvocet  Review of Systems GI:  Denies abdominal pain, nausea, and vomiting. MS:  She still has pain @ base of R great toe in sole.  Physical Exam  General:  well-nourished,in no acute distress;  alert,appropriate and cooperative throughout examination Skin:  20 X 20 mm  cellulitis , 10X 7 papule with central minute pustule  distal to elbow Cervical Nodes:  No lymphadenopathy noted Axillary Nodes:  No palpable lymphadenopathy   Impression & Recommendations:  Problem # 1:  CELLULITIS AND ABSCESS OF UNSPECIFIED SITE (ICD-682.9)  Her updated medication list for this problem includes:    Cephalexin 500 Mg Caps (Cephalexin) .Marland Kitchen... 1 two times a day  Orders: Prescription Created Electronically [phone])  Problem # 2:  TOE PAIN (ICD-729.5)  Orders: Podiatry Referral (Podiatry)  Complete Medication List: 1)  Latanoprost 0.005 % Soln (Latanoprost) .Marland Kitchen.. 1 drop in each eye daily 2)  Fexofenadine Hcl 180 Mg Tabs (Fexofenadine hcl) .... Prn 3)  Omeprazole 20 Mg Tbec (Omeprazole) .Marland Kitchen.. 1 q am 4)  Bayer Aspirin 325 Mg Tabs (Aspirin) .Marland Kitchen.. 1 by mouth once daily 5)  Align Caps (Misc intestinal flora regulat) .Marland Kitchen.. 1 by mouth q d 6)  Losartan Potassium 100 Mg Tabs (Losartan potassium) .Marland Kitchen.. 1 by mouth once daily 7)  Dicyclomine Hcl 20 Mg Tabs (Dicyclomine hcl) .Marland Kitchen.. 1 by mouth every 6 hours as needed 8)  Singulair 10 Mg Tabs (Montelukast sodium) .Marland KitchenMarland KitchenMarland Kitchen 1  once daily 9)  Combivent 18-103 Mcg/act Aero (Ipratropium-albuterol) .Marland Kitchen.. 1-2 puffs every 4-6 hrs as needed cough/ shortness of breath 10)  Liquid Coq10  .... 2 teaspoons two times a day 11)  Furosemide 20 Mg Tabs (Furosemide) .Marland Kitchen.. 1 once daily as needed swelling 12)  Cephalexin 500 Mg Caps (Cephalexin) .Marland Kitchen.. 1 two times a day 13)  Mupirocin 2 % Oint (Mupirocin) .... Apply once daily - two times a day after cleansing  Patient Instructions: 1)  Report increasing pain , red streaks up arm & fever. Prescriptions: MUPIROCIN 2 % OINT (MUPIROCIN) apply once daily - two times a day after cleansing  #15 grams x 0   Entered and Authorized by:   Marga Melnick MD   Signed by:   Marga Melnick MD on 10/09/2010   Method used:   Faxed to ...       [facility] (retail)       [address]       [place], [place]  [address]       Ph: [phone]       Fax: [phone]   RxID:   [id number] CEPHALEXIN 500 MG CAPS (CEPHALEXIN) 1 two times a day  #14 x 0   Entered and Authorized by:   Marga Melnick MD   Signed by:   Marga Melnick MD on 10/09/2010   Method used:   Faxed to ...       [facility] (retail)       [address]       [place], [place]  [address]       Ph: [phone]       Fax: [phone]   RxID:   [id number]    Orders Added: 1)  Est. Patient Level III [[address]] 2)  Prescription Created Electronically [G8553] 3)  Podiatry Referral [Podiatry]

## 2010-12-22 NOTE — Assessment & Plan Note (Signed)
Summary: flu shot/cbs  Flu Vaccine Consent Questions     Do you have a history of severe allergic reactions to this vaccine? no    Any prior history of allergic reactions to egg and/or gelatin? no    Do you have a sensitivity to the preservative Thimersol? no    Do you have a past history of Guillan-Barre Syndrome? no    Do you currently have an acute febrile illness? no    Have you ever had a severe reaction to latex? no    Vaccine information given and explained to patient? yes    Are you currently pregnant? no    Lot Number:[id number]   Exp Date:05/22/2011   Site Given  Left Deltoid IM  Nurse Visit   Allergies: 1)  ! Fosamax 2)  ! * Altace 3)  ! Oxybutynin Chloride (Oxybutynin Chloride) 4)  ! Darvocet  Orders Added: 1)  Admin 1st Vaccine [90471] 2)  Flu Vaccine 35yrs + [[address]]

## 2010-12-22 NOTE — Progress Notes (Signed)
Summary: RX for Diovan  Phone Note Call from Patient Call back at Home Phone [phone]   Caller: Patient Summary of Call: Patient called to say she is seeing less swelling since switching back to Divoan(samples given), Patient would now like RX [facility]    New/Updated Medications: DIOVAN 160 MG TABS (VALSARTAN) 1 by mouth once daily Prescriptions: DIOVAN 160 MG TABS (VALSARTAN) 1 by mouth once daily  #90 x 2   Entered by:   Shonna Chock CMA   Authorized by:   Marga Melnick MD   Signed by:   Shonna Chock CMA on 06/19/2010   Method used:   Electronically to        [facility] ([facility]ail)       [address]       [place], [place]  [address]       Ph: [phone]       Fax: [phone]   RxID:   [id number]

## 2010-12-22 NOTE — Letter (Signed)
Summary: Primary Care Consult Scheduled Letter  [facility] [place]  [address] [place], [place] [address]   Phone: [phone]  Fax: [phone]      03/26/2010 MRN: [id number]  [facility] [address] [place], [place]  [address]    Dear Ms. Thurlow,    We have scheduled an appointment for you.  At the recommendation of Dr. Marga Melnick, we have scheduled you for Pulmonary Function Test (PFTs) with [facility] on 04-16-2010 at 11:00am.  Their address is [address], [address] floor, [place] [address]. The office phone number is [phone].  If this appointment day and time is not convenient for you, please feel free to call the office of the doctor you are being referred to at the number listed above and reschedule the appointment.    It is important for you to keep your scheduled appointments. We are here to make sure you are given good patient care.   Thank you,    Renee, Patient Care Coordinator  at [facility]

## 2010-12-22 NOTE — Progress Notes (Signed)
Summary: fyi forgot to mention at [facility] today  Phone Note Call from Patient   Caller: Patient Summary of Call: pt called left msg, was seen today and forgot to mention "just has had no energy since al of this is going on" Initial call taken by: Kandice Hams,  February 23, 2010 4:11 PM  Follow-up for Phone Call        thyroid & complete blood counts pending Follow-up by: Marga Melnick MD,  February 23, 2010 6:08 PM  Additional Follow-up for Phone Call Additional follow up Details #1::        Left msg for pt  Kandice Hams  February 24, 2010 5:02 PM

## 2010-12-22 NOTE — Assessment & Plan Note (Signed)
Summary: cough, productive/nta   Vital Signs:  Patient profile:   75 year old female Weight:      165 pounds Temp:     98.1 degrees F oral Pulse rate:   64 / minute Resp:     15 per minute BP sitting:   110 / 68  (left arm) Cuff size:   large  Vitals Entered By: Shonna Chock (Mar 26, 2010 12:13 PM) CC: Cough  Comments REVIEWED MED LIST, PATIENT AGREED DOSE AND INSTRUCTION CORRECT    Primary Care Provider:  Marga Melnick, MD  CC:  Cough .  History of Present Illness: Onset 1 week ago as ST followed by cough with green sputum. Her husband was ill first with similar symptoms.Rx: Neti pot  Allergies: 1)  ! Fosamax 2)  ! * Altace 3)  ! Oxybutynin Chloride (Oxybutynin Chloride) 4)  ! Darvocet  Review of Systems General:  Denies chills, fever, and sweats. ENT:  Denies nasal congestion and sinus pressure; No frontal headache, facial pain or purulence. Resp:  Complains of shortness of breath; denies chest pain with inspiration, coughing up blood, and wheezing. Allergy:  Denies itching eyes and sneezing.  Physical Exam  General:  in no acute distress; alert,appropriate and cooperative throughout examination Ears:  External ear exam shows no significant lesions or deformities.  Otoscopic examination reveals clear canals, tympanic membranes are intact bilaterally without bulging, retraction, inflammation or discharge. Hearing is grossly normal bilaterally. Nose:  External nasal examination shows no deformity or inflammation. Nasal mucosa are pink and moist without lesions or exudates. Mouth:  Oral mucosa and oropharynx without lesions or exudates.  Teeth in good repair. Lungs:  Normal respiratory effort, chest expands symmetrically. Lungs are clear to auscultation, no crackles or wheezes. Cervical Nodes:  No lymphadenopathy noted Axillary Nodes:  No palpable lymphadenopathy   Impression & Recommendations:  Problem # 1:  BRONCHITIS-ACUTE (ICD-466.0)  Her updated medication  list for this problem includes:    Singulair 10 Mg Tabs (Montelukast sodium) .Marland Kitchen... 1 once daily    Azithromycin 250 Mg Tabs (Azithromycin) .Marland Kitchen... As per pack    Benzonatate 200 Mg Caps (Benzonatate) .Marland Kitchen... 1 every 6-8  hrs as needed cough  Orders: Misc. Referral (Misc. Ref)  Complete Medication List: 1)  Triamterene-hctz 75-50 Mg Tabs (Triamterene-hctz) .... 1/2 once daily 2)  Travatan 0.004 % Soln (Travoprost) .Marland Kitchen.. 1 drop in each eye daily 3)  Fexofenadine Hcl 180 Mg Tabs (Fexofenadine hcl) .... Prn 4)  Omeprazole 20 Mg Tbec (Omeprazole) .Marland Kitchen.. 1 q am 5)  Bayer Aspirin 325 Mg Tabs (Aspirin) .Marland Kitchen.. 1 by mouth once daily 6)  Align Caps (Misc intestinal flora regulat) .Marland Kitchen.. 1 by mouth q d 7)  Losartan Potassium 100 Mg Tabs (Losartan potassium) .Marland Kitchen.. 1 once daily inplace of diovan 8)  Dicyclomine Hcl 20 Mg Tabs (Dicyclomine hcl) 9)  Singulair 10 Mg Tabs (Montelukast sodium) .Marland Kitchen.. 1 once daily 10)  Azithromycin 250 Mg Tabs (Azithromycin) .... As per pack 11)  Benzonatate 200 Mg Caps (Benzonatate) .Marland Kitchen.. 1 every 6-8  hrs as needed cough  Patient Instructions: 1)  Drink as much fluid as you can tolerate for the next few days. Prescriptions: BENZONATATE 200 MG CAPS (BENZONATATE) 1 every 6-8  hrs as needed cough  #15 x 0   Entered and Authorized by:   Marga Melnick MD   Signed by:   Marga Melnick MD on 03/26/2010   Method used:   Print then Give to Patient   RxID:  [id number] AZITHROMYCIN 250 MG TABS (AZITHROMYCIN) as per pack  #1 x 0   Entered and Authorized by:   Marga Melnick MD   Signed by:   Marga Melnick MD on 03/26/2010   Method used:   Print then Give to Patient   RxID:   [id number]

## 2010-12-22 NOTE — Assessment & Plan Note (Signed)
Summary: foot swollen & red/cbs   Vital Signs:  Patient profile:   75 year old female Weight:      168.6 pounds BMI:     28.16 Temp:     98.3 degrees F oral Pulse rate:   64 / minute Resp:     18 per minute BP sitting:   140 / 78  (left arm) Cuff size:   large  Vitals Entered By: Shonna Chock CMA (September 02, 2010 2:41 PM) CC: Redness and swelling with foot, Lower Extremity Joint pain   Primary Care Provider:  Marga Melnick, MD  CC:  Redness and swelling with foot and Lower Extremity Joint pain.  History of Present Illness: Lower Extremity Joint Pain      This is a 75 year old woman who presents with Lower Extremity Joint pain as of 08/31/2010.  The patient reports swelling and redness.  The pain is located in the right foot @ ball & great toe.  The pain began suddenly and with no injury.  The pain is described as ? and constant . It resolved with Aleve X1.  The patient denies the following symptoms: fever, rash, photosensitivity, eye symptoms, diarrhea, and dysuria.  She is on Triam/ HCTZ 1/2 -1 as neededfor edema. No PMH of gout , but her daughter had it. She has had some red wine recently.See BP; she has been undr stress recently  Current Medications (verified): 1)  Triamterene-Hctz 75-50 Mg Tabs (Triamterene-Hctz) .... 1/2 Once Daily 2)  Travatan 0.004 % Soln (Travoprost) .Marland Kitchen.. 1 Drop in Each Eye Daily 3)  Fexofenadine Hcl 180 Mg  Tabs (Fexofenadine Hcl) .... Prn 4)  Omeprazole 20 Mg Tbec (Omeprazole) .Marland Kitchen.. 1 Q Am 5)  Bayer Aspirin 325 Mg Tabs (Aspirin) .Marland Kitchen.. 1 By Mouth Once Daily 6)  Align  Caps (Misc Intestinal Flora Regulat) .Marland Kitchen.. 1 By Mouth Q D 7)  Losartan Potassium 100 Mg Tabs (Losartan Potassium) .Marland Kitchen.. 1 By Mouth Once Daily 8)  Dicyclomine Hcl 20 Mg Tabs (Dicyclomine Hcl) 9)  Singulair 10 Mg Tabs (Montelukast Sodium) .Marland Kitchen.. 1 Once Daily 10)  Combivent 18-103 Mcg/act Aero (Ipratropium-Albuterol) .Marland Kitchen.. 1-2 Puffs Every 4-6 Hrs As Needed Cough/ Shortness of Breath 11)  Liquid  Coq10 .... 2 Teaspoons Daily  Allergies: 1)  ! Fosamax 2)  ! * Altace 3)  ! Oxybutynin Chloride (Oxybutynin Chloride) 4)  ! Darvocet  Physical Exam  General:  in no acute distress; alert,appropriate and cooperative throughout examination Pulses:  R and L dorsalis pedis and posterior tibial pulses are full and equal bilaterally Extremities:  Lipedema ; varicose veins LLE > RLE. Toe with normal ROM & no tenderness. Skin:  Intact without suspicious lesions or rashes   Impression & Recommendations:  Problem # 1:  TOE PAIN (ICD-729.5)  HCTZ Rx; FH gout  Orders: Venipuncture ([address]) TLB-Uric Acid, Blood (84550-URIC)  Problem # 2:  HYPERTENSION (ICD-401.9)  The following medications were removed from the medication list:    Triamterene-hctz 75-50 Mg Tabs (Triamterene-hctz) .Marland Kitchen... 1/2 once daily Her updated medication list for this problem includes:    Losartan Potassium 100 Mg Tabs (Losartan potassium) .Marland Kitchen... 1 by mouth once daily    Furosemide 20 Mg Tabs (Furosemide) .Marland Kitchen... 1 once daily as needed swelling  Complete Medication List: 1)  Travatan 0.004 % Soln (Travoprost) .Marland Kitchen.. 1 drop in each eye daily 2)  Fexofenadine Hcl 180 Mg Tabs (Fexofenadine hcl) .... Prn 3)  Omeprazole 20 Mg Tbec (Omeprazole) .Marland Kitchen.. 1 q am 4)  Bayer  Aspirin 325 Mg Tabs (Aspirin) .Marland Kitchen.. 1 by mouth once daily 5)  Align Caps (Misc intestinal flora regulat) .Marland Kitchen.. 1 by mouth q d 6)  Losartan Potassium 100 Mg Tabs (Losartan potassium) .Marland Kitchen.. 1 by mouth once daily 7)  Dicyclomine Hcl 20 Mg Tabs (Dicyclomine hcl) 8)  Singulair 10 Mg Tabs (Montelukast sodium) .Marland Kitchen.. 1 once daily 9)  Combivent 18-103 Mcg/act Aero (Ipratropium-albuterol) .Marland Kitchen.. 1-2 puffs every 4-6 hrs as needed cough/ shortness of breath 10)  Liquid Coq10  .... 2 teaspoons daily 11)  Furosemide 20 Mg Tabs (Furosemide) .Marland Kitchen.. 1 once daily as needed swelling  Patient Instructions: 1)  Stop Triam/ HCTZ. 2)  Check your Blood Pressure regularly. If it is above:135/85  ON [place]AGE  you should make an appointment. Prescriptions: OMEPRAZOLE 20 MG TBEC (OMEPRAZOLE) 1 q am  #90 Capsule x 1   Entered and Authorized by:   Marga Melnick MD   Signed by:   Marga Melnick MD on 09/02/2010   Method used:   Faxed to ...       [facility]       [address]       [place], [place]  [address]       Ph: [phone]       Fax: [phone]   RxID:   [id number] FUROSEMIDE 20 MG TABS (FUROSEMIDE) 1 once daily as needed swelling  #90 x 1   Entered and Authorized by:   Marga Melnick MD   Signed by:   Marga Melnick MD on 09/02/2010   Method used:   Faxed to ...       [facility]retail)       [address]       [place], [place]  [address]       Ph: [phone]       Fax: [phone]   RxID:   [id number]   Appended Document: foot swollen & red/cbs

## 2010-12-22 NOTE — Letter (Signed)
Summary: Results Follow up Letter  [facility] [place]  [address] [place], [place] [address]   Phone: [phone]  Fax: [phone]    12/12/2009 MRN: [id number]  [facility] [address] [place], [place]  [address]  Dear Ms. Gapinski,  The following are the results of your recent test(s):  Test         Result    Pap Smear:        Normal _____  Not Normal _____ Comments: ______________________________________________________ Cholesterol: LDL(Bad cholesterol):         Your goal is less than:         HDL (Good cholesterol):       Your goal is more than: Comments:  ______________________________________________________ Mammogram:        Normal _____  Not Normal _____ Comments:  ___________________________________________________________________ Hemoccult:        Normal __X___  Not normal _______ Comments:    _____________________________________________________________________ Other Tests:    We routinely do not discuss normal results over the telephone.  If you desire a copy of the results, or you have any questions about this information we can discuss them at your next office visit.   Sincerely,

## 2010-12-22 NOTE — Miscellaneous (Signed)
Summary: Orders Update pft charges  Clinical Lists Changes  Orders: Added new Service order of Carbon Monoxide diffusing w/capacity (94720) - Signed Added new Service order of Lung Volumes (94240) - Signed Added new Service order of Spirometry (Pre & Post) (94060) - Signed 

## 2010-12-22 NOTE — Assessment & Plan Note (Signed)
Summary: cough/sore throat/kdc   Vital Signs:  Patient profile:   75 year old female Weight:      166 pounds Temp:     98.3 degrees F oral Pulse rate:   72 / minute Pulse rhythm:   regular BP sitting:   130 / 80  (left arm) Cuff size:   large  Vitals Entered By: Army Fossa CMA (February 13, 2010 11:42 AM) CC: Pt here c/o chest congestion, sore throat. , Cough   History of Present Illness:  Cough      This is a 75 year old woman who presents with Cough.  The symptoms began 1 week ago.  The patient reports productive cough, but denies non-productive cough, pleuritic chest pain, shortness of breath, wheezing, exertional dyspnea, fever, hemoptysis, and malaise.  Associated symtpoms include sore throat.  The patient denies the following symptoms: cold/URI symptoms, nasal congestion, chronic rhinitis, weight loss, acid reflux symptoms, and peripheral edema.  The cough is worse with activity.  Ineffective prior treatments have included OTC cough medication.    Current Medications (verified): 1)  Triamterene-Hctz 75-50 Mg Tabs (Triamterene-Hctz) .... 1/2 Once Daily 2)  Travatan 0.004 % Soln (Travoprost) .Marland Kitchen.. 1 Drop in Each Eye Daily 3)  Fexofenadine Hcl 180 Mg  Tabs (Fexofenadine Hcl) .... Prn 4)  Omeprazole 20 Mg Tbec (Omeprazole) .Marland Kitchen.. 1 Q Am 5)  Bayer Aspirin 325 Mg Tabs (Aspirin) .Marland Kitchen.. 1 By Mouth Once Daily 6)  Align  Caps (Misc Intestinal Flora Regulat) .Marland Kitchen.. 1 By Mouth Q D 7)  Losartan Potassium 100 Mg Tabs (Losartan Potassium) .Marland Kitchen.. 1 Once Daily Inplace of Diovan  Allergies: 1)  ! Fosamax 2)  ! * Altace 3)  ! Oxybutynin Chloride (Oxybutynin Chloride) 4)  ! Darvocet  Past History:  Past medical, surgical, family and social histories (including risk factors) reviewed for relevance to current acute and chronic problems.  Past Medical History: Reviewed history from 11/18/2009 and no changes required. Diverticulitis, hx of DVT, hx of 2000 Hyperlipidemia Hypertension Superficial  phlebitis X 2, 1999, 2000 Ocular Hypertension, Glaucoma suspect, Dr Dagoberto Ligas Diverticulosis, colon 2002 Skin cancer, hx of  Past Surgical History: Reviewed history from 11/18/2009 and no changes required. Hysterectomy &  BSO for dysfunctional menses, endometriosis 1980 Endoscopy  : gastritis Varicose Vein stripping 1960, 1965, & 2009 Tonsillectomy Cataract extraction & implant OS  Family History: Reviewed history from 11/18/2009 and no changes required. Father: MI @ 29 Mother: Alsheimer's Siblings:2 brothers high lipids, 1 bro HTN daughter : colectomy for diverticulitis; MGM  colon CA  Social History: Reviewed history from 11/18/2009 and no changes required. Retired Married Never Smoked Alcohol use-no Regular exercise-yes: CURVES 3X/ week  Review of Systems      See HPI  Physical Exam  General:  Well-developed,well-nourished,in no acute distress; alert,appropriate and cooperative throughout examination Ears:  External ear exam shows no significant lesions or deformities.  Otoscopic examination reveals clear canals, tympanic membranes are intact bilaterally without bulging, retraction, inflammation or discharge. Hearing is grossly normal bilaterally. Nose:  no external deformity, mucosal erythema, and mucosal edema.   Mouth:  postnasal drip.   Neck:  No deformities, masses, or tenderness noted. Lungs:  R wheezes and L wheezes.   Heart:  normal rate.   Psych:  Oriented X3 and normally interactive.     Impression & Recommendations:  Problem # 1:  BRONCHITIS- ACUTE (ICD-466.0)  The following medications were removed from the medication list:    Azithromycin 250 Mg Tabs (Azithromycin) .Marland Kitchen... As per  pack Her updated medication list for this problem includes:    Zithromax 1 Gm Pack (Azithromycin) .Marland Kitchen... As directed  Take antibiotics and other medications as directed. Encouraged to push clear liquids, get enough rest, and take acetaminophen as needed. To be seen in 5-7  days if no improvement, sooner if worse.  Complete Medication List: 1)  Triamterene-hctz 75-50 Mg Tabs (Triamterene-hctz) .... 1/2 once daily 2)  Travatan 0.004 % Soln (Travoprost) .Marland Kitchen.. 1 drop in each eye daily 3)  Fexofenadine Hcl 180 Mg Tabs (Fexofenadine hcl) .... Prn 4)  Omeprazole 20 Mg Tbec (Omeprazole) .Marland Kitchen.. 1 q am 5)  Bayer Aspirin 325 Mg Tabs (Aspirin) .Marland Kitchen.. 1 by mouth once daily 6)  Align Caps (Misc intestinal flora regulat) .Marland Kitchen.. 1 by mouth q d 7)  Losartan Potassium 100 Mg Tabs (Losartan potassium) .Marland Kitchen.. 1 once daily inplace of diovan 8)  Zithromax 1 Gm Pack (Azithromycin) .... As directed  Patient Instructions: 1)  use advair 1 inh two times a day 2)  finish antibiotics 3)  Please schedule a follow-up appointment as needed .  Prescriptions: ZITHROMAX 1 GM PACK (AZITHROMYCIN) as directed  #1 x 0   Entered and Authorized by:   Loreen Freud DO   Signed by:   Loreen Freud DO on 02/13/2010   Method used:   Electronically to        [facility]       [address]       [place], [place]  [address]       Ph: [phone]       Fax: [phone]   RxID:   [id number]

## 2010-12-22 NOTE — Medication Information (Signed)
Summary: [facility]  [facility]   Imported By: Lester  12/30/2009 08:38:11  _____________________________________________________________________  External Attachment:    Type:   Image     Comment:   External Document

## 2010-12-22 NOTE — Assessment & Plan Note (Signed)
Summary: Ongoing Symptoms-would like to see Dr.Suhaas Agena Only/scm   Vital Signs:  Patient profile:   75 year old female Weight:      156.6 pounds BMI:     26.15 O2 Sat:      99 % Temp:     98.1 degrees F oral Pulse rate:   60 / minute Resp:     15 per minute BP sitting:   140 / 78  (left arm) Cuff size:   regular  Vitals Entered By: Shonna Chock (February 23, 2010 2:15 PM) CC: 1.) Hoarse, no energy and SOB  2.) Refill Triamterene-HCTZ Comments REVIEWED MED LIST, PATIENT AGREED DOSE AND INSTRUCTION CORRECT    Primary Care Provider:  Marga Melnick, MD  CC:  1.) Hoarse and no energy and SOB  2.) Refill Triamterene-HCTZ.  History of Present Illness: Hoarseness recurs with each bronchitis; it usually it resolves but slight residual hoarseness since 02/13/2010 despite Zpack.  Allergies: 1)  ! Fosamax 2)  ! * Altace 3)  ! Oxybutynin Chloride (Oxybutynin Chloride) 4)  ! Darvocet  Review of Systems General:  Denies chills, fever, and sweats. ENT:  Denies difficulty swallowing. Resp:  Denies cough and sputum productive. Allergy:  Complains of itching eyes; denies sneezing; Itching eyes ?  from Blepharitis. Sneezes occasionally with meals.  Physical Exam  General:  well-nourished; alert,appropriate and cooperative throughout examination Ears:  External ear exam shows no significant lesions or deformities.  Otoscopic examination reveals clear canals, tympanic membranes are intact bilaterally without bulging, retraction, inflammation or discharge. Hearing is grossly normal bilaterally. Nose:  External nasal examination shows no deformity or inflammation. Nasal mucosa are pink and moist without lesions or exudates. Mouth:  Oral mucosa and oropharynx without lesions or exudates.  Teeth in good repair. Neck:  No deformities, masses, or tenderness noted. Lungs:  Normal respiratory effort, chest expands symmetrically. Lungs are clear to auscultation, no crackles or wheezes. Cervical Nodes:   No lymphadenopathy noted Axillary Nodes:  No palpable lymphadenopathy   Impression & Recommendations:  Problem # 1:  HOARSENESS (EAV-409.81)  in context of recurrent bronchitis, probable RAD  Orders: Venipuncture (19147) TLB-CBC Platelet - w/Differential (85025-CBCD) TLB-TSH (Thyroid Stimulating Hormone) (84443-TSH)  Complete Medication List: 1)  Triamterene-hctz 75-50 Mg Tabs (Triamterene-hctz) .... 1/2 once daily 2)  Travatan 0.004 % Soln (Travoprost) .Marland Kitchen.. 1 drop in each eye daily 3)  Fexofenadine Hcl 180 Mg Tabs (Fexofenadine hcl) .... Prn 4)  Omeprazole 20 Mg Tbec (Omeprazole) .Marland Kitchen.. 1 q am 5)  Bayer Aspirin 325 Mg Tabs (Aspirin) .Marland Kitchen.. 1 by mouth once daily 6)  Align Caps (Misc intestinal flora regulat) .Marland Kitchen.. 1 by mouth q d 7)  Losartan Potassium 100 Mg Tabs (Losartan potassium) .Marland Kitchen.. 1 once daily inplace of diovan 8)  Dicyclomine Hcl 20 Mg Tabs (Dicyclomine hcl) 9)  Singulair 10 Mg Tabs (Montelukast sodium) .Marland Kitchen.. 1 once daily  Patient Instructions: 1)  Gargle & spit after using Advair. Prescriptions: TRIAMTERENE-HCTZ 75-50 MG TABS (TRIAMTERENE-HCTZ) 1/2 once daily  #90 x 1   Entered and Authorized by:   Marga Melnick MD   Signed by:   Marga Melnick MD on 02/23/2010   Method used:   Print then Give to Patient   RxID:   [id number] SINGULAIR 10 MG TABS (MONTELUKAST SODIUM) 1 once daily  #30 x 5   Entered and Authorized by:   Marga Melnick MD   Signed by:   Marga Melnick MD on 02/23/2010   Method used:   Print then Give  to Patient   RxID:   [id number]

## 2010-12-24 NOTE — Consult Note (Signed)
Summary: [facility]   Imported By: Lanelle Bal 11/25/2010 11:10:36  _____________________________________________________________________  External Attachment:    Type:   Image     Comment:   External Document

## 2010-12-24 NOTE — Assessment & Plan Note (Signed)
Summary: SORE THROAT/CONGESTED/KN   Vital Signs:  Patient profile:   75 year old female Weight:      169.8 pounds BMI:     28.36 Temp:     98.0 degrees F oral Pulse rate:   64 / minute Resp:     15 per minute BP sitting:   130 / 80  (left arm) Cuff size:   large  Vitals Entered By: Shonna Chock CMA (November 26, 2010 4:10 PM) CC: Sore throat, congestion, sneezing, and cough since Monday , URI symptoms   Primary Care Provider:  Marga Melnick, MD  CC:  Sore throat, congestion, sneezing, and cough since Monday , and URI symptoms.  History of Present Illness:      This is a 75 year old woman who presents with URI symptoms.Onset 01/02 as ST.  The patient now reports nasal congestion,scant  purulent nasal discharge, and   productive cough, but denies sore throat and earache.  The patient denies fever, dyspnea, and wheezing.  The patient also reports headache.  The patient denies the following risk factors for Strep sinusitis:bilateral facial pain, tooth pain, and tender adenopathy.  Rx: Mucinex DM  Current Medications (verified): 1)  Latanoprost 0.005 % Soln (Latanoprost) .Marland Kitchen.. 1 Drop in Each Eye Daily 2)  Fexofenadine Hcl 180 Mg  Tabs (Fexofenadine Hcl) .... Prn 3)  Omeprazole 20 Mg Tbec (Omeprazole) .Marland Kitchen.. 1 Q Am 4)  Bayer Aspirin 325 Mg Tabs (Aspirin) .Marland Kitchen.. 1 By Mouth Once Daily 5)  Align  Caps (Misc Intestinal Flora Regulat) .Marland Kitchen.. 1 By Mouth Q D 6)  Losartan Potassium 100 Mg Tabs (Losartan Potassium) .Marland Kitchen.. 1 By Mouth Once Daily 7)  Dicyclomine Hcl 20 Mg Tabs (Dicyclomine Hcl) .Marland Kitchen.. 1 By Mouth Every 6 Hours As Needed 8)  Singulair 10 Mg Tabs (Montelukast Sodium) .Marland Kitchen.. 1 Once Daily 9)  Combivent 18-103 Mcg/act Aero (Ipratropium-Albuterol) .Marland Kitchen.. 1-2 Puffs Every 4-6 Hrs As Needed Cough/ Shortness of Breath 10)  Furosemide 20 Mg Tabs (Furosemide) .Marland Kitchen.. 1 Once Daily As Needed Swelling  Allergies: 1)  ! Fosamax 2)  ! * Altace 3)  ! Oxybutynin Chloride (Oxybutynin Chloride) 4)  !  Darvocet  Physical Exam  General:  in no acute distress; alert,appropriate and cooperative throughout examination Ears:  External ear exam shows no significant lesions or deformities.  Otoscopic examination reveals clear canals, tympanic membranes are intact bilaterally without bulging, retraction, inflammation or discharge. Hearing is grossly normal bilaterally. Nose:  External nasal examination shows no deformity or inflammation. Nasal mucosa are pink and moist without lesions or exudates. Slight hyponasal speech Mouth:  Oral mucosa and oropharynx without lesions or exudates.  Teeth in good repair. Lungs:  Normal respiratory effort, chest expands symmetrically. Lungs are clear to auscultation, no crackles or wheezes. Cervical Nodes:  No lymphadenopathy noted Axillary Nodes:  No palpable lymphadenopathy   Impression & Recommendations:  Problem # 1:  BRONCHITIS-ACUTE (ICD-466.0)  The following medications were removed from the medication list:    Cephalexin 500 Mg Caps (Cephalexin) .Marland Kitchen... 1 two times a day Her updated medication list for this problem includes:    Singulair 10 Mg Tabs (Montelukast sodium) .Marland Kitchen... 1 once daily    Combivent 18-103 Mcg/act Aero (Ipratropium-albuterol) .Marland Kitchen... 1-2 puffs every 4-6 hrs as needed cough/ shortness of breath    Azithromycin 250 Mg Tabs (Azithromycin) .Marland Kitchen... As per pack  Problem # 2:  URI (ICD-465.9)  Her updated medication list for this problem includes:    Fexofenadine Hcl 180 Mg Tabs (  Fexofenadine hcl) .Marland Kitchen... Prn    Bayer Aspirin 325 Mg Tabs (Aspirin) .Marland Kitchen... 1 by mouth once daily  Complete Medication List: 1)  Latanoprost 0.005 % Soln (Latanoprost) .Marland Kitchen.. 1 drop in each eye daily 2)  Fexofenadine Hcl 180 Mg Tabs (Fexofenadine hcl) .... Prn 3)  Omeprazole 20 Mg Tbec (Omeprazole) .Marland Kitchen.. 1 q am 4)  Bayer Aspirin 325 Mg Tabs (Aspirin) .Marland Kitchen.. 1 by mouth once daily 5)  Align Caps (Misc intestinal flora regulat) .Marland Kitchen.. 1 by mouth q d 6)  Losartan Potassium 100  Mg Tabs (Losartan potassium) .Marland Kitchen.. 1 by mouth once daily 7)  Dicyclomine Hcl 20 Mg Tabs (Dicyclomine hcl) .Marland Kitchen.. 1 by mouth every 6 hours as needed 8)  Singulair 10 Mg Tabs (Montelukast sodium) .Marland Kitchen.. 1 once daily 9)  Combivent 18-103 Mcg/act Aero (Ipratropium-albuterol) .Marland Kitchen.. 1-2 puffs every 4-6 hrs as needed cough/ shortness of breath 10)  Furosemide 20 Mg Tabs (Furosemide) .Marland Kitchen.. 1 once daily as needed swelling 11)  Azithromycin 250 Mg Tabs (Azithromycin) .... As per pack 12)  Fluticasone Propionate 50 Mcg/act Susp (Fluticasone propionate) .Marland Kitchen.. 1 spray two times a day as needed for congestion  Other Orders: Rapid Strep ([address]109)  Patient Instructions: 1)  Neti pot once daily - two times a day  as needed . 2)  Drink as much NON dairy  fluid as you can tolerate for the next few days. Prescriptions: FLUTICASONE PROPIONATE 50 MCG/ACT SUSP (FLUTICASONE PROPIONATE) 1 spray two times a day as needed for congestion  #1 x 5   Entered and Authorized by:   Marga Melnick MD   Signed by:   Marga Melnick MD on 11/26/2010   Method used:   Electronically to        [facility]       [address]       [place], [place]  [address]       Ph: [phone]       Fax: [phone]   RxID:   [id number] AZITHROMYCIN 250 MG TABS (AZITHROMYCIN) as per pack  #1 x 0   Entered and Authorized by:   Marga Melnick MD   Signed by:   Marga Melnick MD on 11/26/2010   Method used:   Electronically to        [facility]       [address]       [place], [place]  [address]       Ph: [phone]       Fax: [phone]   RxID:   [id number]    Orders Added: 1)  Rapid Strep [60630] 2)  Est. Patient Level III [16010]    Laboratory Results    Other Tests  Rapid Strep: negative

## 2011-02-02 ENCOUNTER — Encounter: Payer: Self-pay | Admitting: Internal Medicine

## 2011-02-10 ENCOUNTER — Other Ambulatory Visit: Payer: Self-pay | Admitting: Internal Medicine

## 2011-02-16 ENCOUNTER — Other Ambulatory Visit: Payer: Self-pay | Admitting: Internal Medicine

## 2011-02-16 ENCOUNTER — Telehealth: Payer: Self-pay

## 2011-02-16 MED ORDER — AMLODIPINE BESYLATE 2.5 MG PO TABS: 5.0000 mg | ORAL_TABLET | Freq: Every day | ORAL | Status: DC

## 2011-02-16 NOTE — Telephone Encounter (Signed)
See new Rx.

## 2011-02-16 NOTE — Telephone Encounter (Signed)
Patient left message on voicemail stating she is feeling very tired and is certain that her symptoms is related to Losartan and would like to know if Dr.Hopper would consider changing her medication. Patient is about to run out of Losartan and would prefer to not refill and just get rx for new med.  Dr.Hopper please advise (FYI-Pending CPX appointment 04/06/2011)

## 2011-02-16 NOTE — Telephone Encounter (Signed)
Spoke w/ pt aware of information but says she has a lot going on right now not able to come in for appt. Wanted to see if she could get change in meds and then f/u when she has her appt for CPX in May. Says that if Dr. Alwyn Ren doesn't want to change then we can just send refill and she will discuss when she comes in.

## 2011-02-16 NOTE — Telephone Encounter (Signed)
Left detailed msg new rx sent to pharmacy.

## 2011-02-16 NOTE — Telephone Encounter (Signed)
She needs to make appt with all meds; Arline Asp could see her this afternoon

## 2011-02-17 ENCOUNTER — Telehealth: Payer: Self-pay | Admitting: Internal Medicine

## 2011-02-17 NOTE — Telephone Encounter (Signed)
Left message on voicemail with Dr.Hopper's clarification: he indicated for patient to start out with 2.5mg  daily and monitor B/P, If B/P 135/85 or greater on average ok to increase to 2.5mg  twice daily.  Patient to call if any questions or concerns

## 2011-02-17 NOTE — Telephone Encounter (Signed)
Patient has a question about instruction for New rx  amlopidine

## 2011-02-18 ENCOUNTER — Other Ambulatory Visit: Payer: Self-pay | Admitting: Internal Medicine

## 2011-02-26 ENCOUNTER — Other Ambulatory Visit: Payer: Self-pay | Admitting: Internal Medicine

## 2011-03-17 ENCOUNTER — Encounter: Payer: Self-pay | Admitting: Internal Medicine

## 2011-03-17 ENCOUNTER — Ambulatory Visit (INDEPENDENT_AMBULATORY_CARE_PROVIDER_SITE_OTHER): Payer: Medicare Other | Admitting: Internal Medicine

## 2011-03-17 VITALS — BP 150/86 | HR 64 | Temp 98.6°F | Wt 173.0 lb

## 2011-03-17 DIAGNOSIS — I10 Essential (primary) hypertension: Secondary | ICD-10-CM

## 2011-03-17 MED ORDER — CARVEDILOL 12.5 MG PO TABS: 12.5000 mg | ORAL_TABLET | Freq: Two times a day (BID) | ORAL | Status: DC

## 2011-03-17 MED ORDER — FUROSEMIDE 20 MG PO TABS: 40.0000 mg | ORAL_TABLET | Freq: Every day | ORAL | Status: DC

## 2011-03-17 NOTE — Patient Instructions (Signed)
Discontinue amlodipine. Start carvedilol 12.5 mg  @ a dose of one half twice a day. After 5-7 days, if blood pressure is not less than 140/90, increase  dose to 12.5 mg twice a day. Take the furosemide 20 mg at a dose of 2 pills daily until completed. Then start 40 mg pill once daily.

## 2011-03-17 NOTE — Progress Notes (Signed)
  Subjective:    Patient ID: Valerie Rollins, female    DOB: 06-02-1936, 75 y.o.   MRN: [id number]  HPI  HYPERTENSION  Disease Monitoring  Blood pressure range: 145-165/75-90  Chest pain: no   Dyspnea: no   Claudication: no   Medication compliance: yes Medication Side Effects  Lightheadedness: no   Urinary frequency: no   Edema: yes, this began  after Amlodipine was initiated  Preventitive Healthcare:  Exercise: no   Salt Restriction: yes but eating out daily      Review of Systems     Objective:   Physical Exam Gen.: Healthy and well-nourished in appearance. Alert, appropriate and cooperative throughout exam. Neck: No deformities, masses, or tenderness noted.. Thyroid  Firm , small. Lungs: Normal respiratory effort; chest expands symmetrically. Lungs are clear to auscultation without rales, wheezes, or increased work of breathing. Heart: Normal rate and rhythm. Normal S1 and S2. No gallop, click, or rub. Grade 1.5-2 to & fro aortic  murmur. Abdomen: Bowel sounds normal; abdomen soft and nontender. No masses, organomegaly or hernias noted.No AAA Vascular: Carotid, radial artery, dorsalis pedis and dorsalis posterior tibial pulses are full and equal. No bruits present. Extremities:  No cyanosis  or deformity noted .Lipedema, varicosities of ankles.Neurologic: Alert and oriented x3. Skin: Intact without suspicious lesions or rashes. Lymph: No cervical, axillary lymphadenopathy present. Psych: Mood and affect are normal. Normally interactive                                                                                         Assessment & Plan:  #1 hypertension; suboptimal control  #2 edema on amlodipine  #3 aortic murmur.  Plan: #1 discontinue amlodipine  #2 increase furosemide to 40 mg daily  #3 titrate carvedilol to control blood pressure at less than 140/90 on average.

## 2011-03-31 ENCOUNTER — Encounter: Payer: Self-pay | Admitting: Internal Medicine

## 2011-04-03 ENCOUNTER — Encounter: Payer: Self-pay | Admitting: Internal Medicine

## 2011-04-06 ENCOUNTER — Encounter: Payer: Self-pay | Admitting: Internal Medicine

## 2011-04-06 NOTE — Procedures (Signed)
DUPLEX DEEP VENOUS EXAM - LOWER EXTREMITY   INDICATION:  Follow up left lesser saphenous vein ablation.   HISTORY:  Edema:  No.  Trauma/Surgery:  Yes.  Pain:  Mild.  PE:  No.  Previous DVT:  Yes.  Anticoagulants:  None.  Other:   DUPLEX EXAM:                CFV   SFV   PopV  PTV    GSV                R  L  R  L  R  L  R   L  R  L  Thrombosis    o  o     o     o      o     +  Spontaneous   +  +     +     +      +     +  Phasic        +  +     +     +      +     +  Augmentation  +  +     +     +      +     +  Compressible  +  +     +     +      +     +  Competent     +  +     +     +      +     +   Legend:  + - yes  o - no  p - partial  D - decreased   IMPRESSION:  1. No evidence of deep venous thrombosis noted in the left leg.  2. The left lesser saphenous vein appears ablated from the      saphenopopliteal junction to the distal calf.    _____________________________  Larina Earthly, M.D.   MG/MEDQ  D:  01/15/2009  T:  01/16/2009  Job:  [id number]

## 2011-04-06 NOTE — Consult Note (Signed)
VASCULAR SURGERY CONSULTATION   See, ASTRYD A  DOB:  [birth year]                                       04/09/2008  EAVWU#:[id number]   This patient is a 75 year old healthy female patient referred for  vascular consultation for venous insufficiency by Dr. Lona Kettle.  This  patient has a long history of symptomatic venous insufficiency having  undergone a procedure on her left leg in 1958 and on both lower  extremities in 1966 by Dr. Langston Masker in [place].  She thinks she has had  vein strippings performed in both legs and has had multiple varicosities  removed.  Since her procedure she has developed slowly recurrent  varicosities in both lower extremities, primarily in the right thigh and  calf and in the left leg primarily below the knee.  These have become  increasingly painful with heaviness, aching, burning, and itching with  prominent swelling in both ankles equal on the right and left sides.  She has a history of thrombophlebitis and one episode of deep venous  thrombosis in one of her legs in about 2000 and has been treated with  Coumadin since that time.  She has painful bulging in both legs which  affects her daily living and is not relieved by analgesics or elevation.  She has not been wearing elastic compression stockings.   PAST MEDICAL HISTORY:  1. Hypertension.  2. Negative for diabetes, coronary artery disease, hyperlipidemia,      COPD or stroke.   PAST SURGICAL HISTORY:  1. Hysterectomy.  2. Bilateral vein stripping with 2 procedures on in 1958 and 2 in      1966.   FAMILY HISTORY:  Positive for coronary artery disease in her father who  died of a myocardial infarction.  Negative for stroke and diabetes.   SOCIAL HISTORY:  She is married, has two children, is retired.  She does  not use tobacco or alcohol.   ALLERGIES OR ADVERSE REACTIONS:  To Fosamax, Altace, oxybutynin, and  some problem with Tropo-N-APAP 100/650 which caused her blood  become  dangerously thin.   MEDICATIONS:  Please see health history form.  She does take Coumadin  7.5 mg per day.   PHYSICAL EXAM:  Blood pressure is 110/64, heart rate 60, respirations  are 14.  Generally, she is a healthy-appearing female in no apparent  distress.  She is alert and oriented x3.  Neck:  Supple 3+ carotid  pulses palpable.  No bruits are audible.  Neurologic:  Normal.  No  palpable adenopathy in the neck.  Chest:  Clear to auscultation.  Cardiovascular:  Reveals a regular rhythm with no murmurs.  Abdomen is  soft, nontender with no masses.  She has 3+ femoral popliteal, dorsalis  pedis pulses palpable bilaterally.  Both feet have 1 to 2+ edema.  She  has diffuse severe varicose veins in both lower extremities in the right  leg beginning in the mid thigh extending down along the course the great  saphenous vein into the anterior and posterior calf region extending  down into the ankle with some hyperpigmentation but no active  ulceration.  On the left side, there are no varicosities noted in the  thigh but below the knee, she has severe bulging varicosities  particularly in the posterior calf up to the popliteal fossa with a 1 to  2+  edema and some early hyperpigmentation in reticular veins in the  ankle area.   Venous ultrasound study was performed today with some of the following  findings.  1. Left great saphenous vein is absent from the groin to the knee.  2. The left small saphenous vein has gross reflux feeding varicosities      in the left posterior calf.  3. The right lateral accessory great saphenous vein, which is a large      vessel, is opened from the saphenofemoral junction to the ankle      feeding varicosities in the right leg with gross reflux throughout.      There is no evidence of deep venous thrombosis in either lower      extremity.   She does have severe symptoms from these advanced varicose veins in both  legs.  We will initially treat her  with elastic compression stockings  which we fitted her for today (20-30 mm) as well as suggesting that she  try elevation and ibuprofen for pain relief.  She will return in 3  months for further evaluation.  If she has had no improvement, I think  she would be best treated by laser ablation of her right great saphenous  vein with multiple stab phlebectomies, which will require 2 sessions  because of the extensive number, and this should be followed by laser  ablation of her left small saphenous vein with stab phlebectomy in the  left posterior calf.   Quita Skye Hart Rochester, M.D.  Electronically Signed  JDL/MEDQ  D:  04/09/2008  T:  04/10/2008  Job:  [id number]   cc:   Titus Dubin. Alwyn Ren, MD,FACP,FCCP

## 2011-04-06 NOTE — Assessment & Plan Note (Signed)
[facility]                         GASTROENTEROLOGY OFFICE NOTE   Rollins, Valerie                         MRN:          [id number]  DATE:07/19/2007                            DOB:          26-Jun-1936    REFERRING PHYSICIAN:  Titus Dubin. Hopper, MD,FACP,FCCP   REASON FOR CONSULTATION:  Right-sided abdominal discomfort.  Question  the need for colonoscopy.   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  The patient also has  chronic irritable bowel syndrome as well as gastroesophageal reflux  disease, and minor intermittent fecal soilage due to pelvic floor  relaxation.  She was last evaluated in this office November 24, 2004.  For  her abdominal complaints, she has been using Imodium and Levbid, with  good results.  She did have an upper endoscopy which was negative.  For  reflux, she is maintained on AcipHex.  The patient was evaluated in Dr.  Frederik Pear office June 23, 2007.  At that time, she had mentioned a  several-month history of vague right lower quadrant abdominal  discomfort.  Blood work at that time revealed a normal CBC, with a  hemoglobin of 14.  Also, normal comprehensive metabolic panel, including  liver function tests.  Hemoccult cards were submitted and returned  negative x3.  Hemoccult studies were also negative earlier this year.  The patient's maternal grandmother has a history of colon cancer.  The  patient herself has had multiple prior colonoscopies in 1990, 1995, and  most recently in 2002.  She has only been noted to have left-sided  diverticulosis.  The patient reports that her right-sided abdominal  discomfort seemed to be positional in nature, not affected by meals or  defecation.  No fevers, bleeding, or other complaints.  She also9 tells  me that upon visiting her chiropractor  she has undergone realignment of  the pelvis which has resulted in complete resolution of her pain.  GI  review of systems is otherwise negative.   PAST MEDICAL HISTORY:  As above.   PAST SURGICAL HISTORY:  Hysterectomy.   ALLERGIES:  FOSAMAX.   CURRENT MEDICATIONS:  1. Diovan 160 mg daily.  2. Warfarin 7.5 mg daily.  3. Triamterene/hydrochlorothiazide 75/50 mg daily.  4. Fexofenadine 180 mg p.r.n.  5. AcipHex 20 mg daily.  6. Xalatan eye drops at night.   FAMILY HISTORY:  Maternal grandmother with colon cancer.  Daughter with  complicated diverticular disease, requiring segmental colectomy.   SOCIAL HISTORY:  The patient is married, with 2 children.  She is a  [employer] trader.  She does not smoke or use alcohol.   REVIEW OF SYSTEMS:  Noncontributory.   PHYSICAL EXAMINATION:  GENERAL:  Well-appearing female, in no acute  distress.  She is alert and oriented.  VITAL SIGNS:  Blood pressure is 120/68, heart rate 60, weight is 162.8  pounds.  HEENT:  Sclerae are anicteric.  Conjunctivae are pink.  Oral mucosa is  intact.  No adenopathy.  LUNGS:  Clear.  HEART:  Regular.  ABDOMEN:  Soft, without tenderness, mass, or hernia.  Good bowel sounds  heard.   IMPRESSION:  1. Transient right lower quadrant discomfort of uncertain etiology,      possibly musculoskeletal, now resolved.  In terms of follow-up      colonoscopy, I see no firm indication at this time.  The patient is      not anemic and has Hemoccult negative stool on mutiple occassions.      As well, she has had three negative colonoscopies in the previous      18 years, most recently in 2002.  2. History of irritable bowel.  3. Gastroesophageal reflux disease.   RECOMMENDATIONS:  1. Continue medical regimen for irritable bowel and reflux disease.  2. Keep planned followup for colonoscopy in 2012, sooner if clinically      indicated.  3. Resume general medical care with Dr. Alwyn Ren.     Wilhemina Bonito. Marina Goodell, MD   Electronically Signed    JNP/MedQ  DD: 07/19/2007  DT: 07/20/2007  Job #: [id number]   cc:   Titus Dubin. Alwyn Ren, MD,FACP,FCCP

## 2011-04-06 NOTE — Procedures (Signed)
DUPLEX DEEP VENOUS EXAM - LOWER EXTREMITY   INDICATION:  Postop greater saphenous vein ablation.   HISTORY:  Edema:  No.  Trauma/Surgery:  Right greater saphenous vein ablation on 11/25/08 by  Dr. Hart Rochester.  Pain:  No.  PE:  No.  Previous DVT:  No.  Anticoagulants:  No.  Other:   DUPLEX EXAM:                CFV   SFV   PopV  PTV    GSV                R  L  R  L  R  L  R   L  R  L  Thrombosis    o  o  o     o     o      +  Spontaneous   +  +  +     +     +      o  Phasic        +  +  +     +     +      o  Augmentation  +  +  +     +     +      o  Compressible  +  +  +     +     +      o  Competent     +  +  +     +     +   Legend:  + - yes  o - no  p - partial  D - decreased   IMPRESSION:  1. Sonographic evidence of ablated right greater saphenous vein.  2. No evidence of deep venous thrombosis.    _____________________________  Quita Skye Hart Rochester, M.D.   AC/MEDQ  D:  12/02/2008  T:  12/02/2008  Job:  [id number]

## 2011-04-06 NOTE — Procedures (Signed)
LOWER EXTREMITY VENOUS REFLUX EXAM   INDICATION:   EXAM:  Using color-flow imaging and pulse Doppler spectral analysis, the  right and left common femoral, superficial femoral, popliteal, posterior  tibial, greater and lesser saphenous veins are evaluated.  There is no  evidence suggesting deep venous insufficiency in the right and left  lower extremity.   The right saphenofemoral junction is not competent.  The right GSV is  not competent with the caliber as described below.   The left proximal short saphenous vein demonstrates incompetency.   GSV Diameter (used if found to be incompetent only)                                            Right    Left  Proximal Greater Saphenous Vein           1.04 cm  cm  Proximal-to-mid-thigh                     1.04 cm  cm  Mid thigh                                 0.67 cm  cm  Mid-distal thigh                          0.67 cm  cm  Distal thigh                              0.66 cm  cm  Knee                                      0.66 cm  cm    IMPRESSION:  1. Right greater saphenous vein reflux is identified with the caliber      ranging from 0.66 cm to 1.04 cm knee to groin.  2. The right greater saphenous vein is not aneurysmal.  3. The right greater saphenous vein is not tortuous.  4. The deep venous system is competent.  5. The left lesser saphenous vein is not competent.        ___________________________________________  Quita Skye. Hart Rochester, M.D.   PB/MEDQ  D:  04/10/2008  T:  04/10/2008  Job:  [id number]

## 2011-04-06 NOTE — Assessment & Plan Note (Signed)
OFFICE VISIT   Valerie Rollins, Valerie Rollins  DOB:  08/11/1936                                       10/14/2008  VWUJW#:[id number]   The patient returns today for further followup regarding her severe  symptomatic venous insufficiency of both lower extremities.  Her history  is well outlined from her visit in May of this year, having previously  had bilateral vein procedures in her great saphenous veins by Dr. Langston Masker  in 1966 and has followed with severe recurrent venous insufficiency and  diffuse varicosities in both thighs and calves.  She now has Rollins patent  great saphenous vein on the right with severe reflux at its junction and  throughout the right greater saphenous vein (I suspect she had Rollins  parallel system when her procedure was done in 1966 and only 1 of the  parallel trunks was removed).  She also has severe reflux in the left  small saphenous vein throughout and at the junction.  She has tried long  leg elastic compression stockings (20-30 mm graduated) for the last 6  months and has tried analgesics as well as elevation of the leg, but has  not obtained any relief of the discomfort, which she is suffering.  This  includes heaviness, aching, burning, itching, as well as increasing  swelling as the day progresses, despite wearing her stockings.  Her  Coumadin has been discontinued by Dr. Alwyn Ren and I do agree with this,  and she is being treated with daily aspirin.  Her symptoms are affecting  her daily living and I think she would best be treated with laser  ablation of the right greater saphenous vein with multiple stab  phlebectomies, and this will require 2 sessions.  She also needs laser  ablation of her left small saphenous vein with stab phlebectomies at the  same setting.  Hopefully, this will relieve this nice lady's symptoms of  venous insufficiency and we will proceed with precertification to have  that done in the near future.   Quita Skye Hart Rochester, M.D.  Electronically Signed   JDL/MEDQ  D:  10/14/2008  T:  10/15/2008  Job:  [id number]   cc:   Titus Dubin. Alwyn Ren, MD,FACP,FCCP

## 2011-04-06 NOTE — Assessment & Plan Note (Signed)
OFFICE VISIT   Valerie Rollins, Valerie Rollins  DOB:  [birth year]                                       01/15/2009  UVOZD#:[id number]   The patient presents today in followup of laser ablation of her left  small saphenous vein and stab phlebectomy of her left calf by Dr. Hart Rochester  on 01/06/2009.  She has done well since the procedure.  She has the  usual amount of post mild bruising and soreness.  She has been compliant  with her compression garments.  She underwent venous duplex today in our  office and this reveals no evidence of DVT in her left leg.  She does  have the small saphenous vein ablated from her saphenopopliteal junction  to the distal calf.  I have reassured the patient regarding this.  She  does have an appointment to see Dr. Hart Rochester on 03/08 for stab phlebectomy  under local in the office.   Larina Earthly, M.D.  Electronically Signed   TFE/MEDQ  D:  01/15/2009  T:  01/16/2009  Job:  [id number]

## 2011-04-06 NOTE — Assessment & Plan Note (Signed)
OFFICE VISIT   Rollins, Valerie A  DOB:  1936/03/21                                       12/02/2008  ZOXWR#:[id number]   The patient returns 1 week post laser ablation of her right great  saphenous vein with multiple stab phlebectomies in the distal thigh and  calf area.  She has done well following her procedure.  She had a mild  to moderate amount of aching in the thigh at the laser ablation site and  some mild to moderate ecchymosis in this area, but that has essentially  resolved.  She has had no tenderness, per se, at the stab phlebectomy  sites.  She still does have some bulging varicosities in the lower third  of the leg which will need further stab phlebectomies in the near  future.  She has no distal edema.  The venous duplex exam reveals total  closure of the right great saphenous vein with no evidence of deep  venous obstruction.   She was reassured regarding these findings and will be scheduled in the  near future for laser ablation of her left small saphenous vein with  stab phlebectomies to be followed by her final course of stab  phlebectomies of the right leg.   Valerie Rollins, M.D.  Electronically Signed   JDL/MEDQ  D:  12/02/2008  T:  12/03/2008  Job:  [id number]

## 2011-04-06 NOTE — Assessment & Plan Note (Signed)
OFFICE VISIT   Squillace, Anysha A.  DOB:  05/15/1936                                       04/28/2009  ZOXWR#:[id number]   Ms. Schuld returns for final followup for her treatment of severe venous  insufficiency both lower extremities.  Her most recent procedure was  March 8 when she had multiple stab phlebectomies of both right and left  calves, having previously undergone laser ablation of her left small  saphenous and parallel system of her right greater saphenous veins.  She  is very pleased with her result, stating that she is having decreased  swelling in both legs as well as decreased cramping and throbbing  discomfort.   She continues to have some hyperpigmentation particularly in the left  ankle which I explained to her would probably not completely resolve.  She has had no bleeding and her last venous duplex revealed closure of  the appropriate veins with no deep venous obstruction.   I reassured her regarding these findings.  She will return to see [place] on  a p.r.n. basis.   Quita Skye Hart Rochester, M.D.  Electronically Signed   JDL/MEDQ  D:  04/28/2009  T:  04/30/2009  Job:  [id number]

## 2011-04-09 NOTE — Assessment & Plan Note (Signed)
[facility]                        [place] OFFICE NOTE   EOLA, WALDREP                         MRN:          [id number]  DATE:01/20/2007                            DOB:          October 05, 1936    Mistee Soliman is a 75 year old white female who has degenerative joint  disease manifested by pain and a popping in the right knee. Initially  she noted it getting out of a car;but she has noted recurrent low  popping over several weeks. She has pain with certain movements. The  pain can last all day. She was concerned about a knot below the  patella anteriorly. When seen on February 15, Homans sign was negative;  she has profound varicose veins. She had crepitus of the knees greater  on the right than the left. There was subcutaneous lymphomatous tissue  in the right patellar area which transilluminated.   She was placed on glucosamine 1500 mg daily along with topical heat or  Zostrix, Arthritis-Strength Tylenol. Nonsteroidals were avoided because  of her Coumadin therapy for deep venous thrombosis.   PAST MEDICAL HISTORY:  Superficial phlebitis in 1995 &1999 and deep  venous thrombosis in 2000. She has had varicose vein stripping in 1960  and possibly bilaterally in 1965.   She returned 02/29 stating that the pain was getting worse despite these  interventions. Moderate relief is provided by Arthritis-Strength Tylenol  at bedtime.   No effusion was noted. Degenerative changes were suggested.   She will be referred to an orthopedist to evaluate the persistent knee  symptoms. Should orthoscopy or other intervention be necessary then  perioperative coverage with Lovenox or heparin would be necessary.   Stopping Coumadin is not an option because of the history of  coagulopathy as described.   SHE IS ALLERGIC OR INTOLERANT TO FOSAMAX, ALTACE, OXYBUTYNIN.   Presently she is on;  1. Coumadin.  2. Xalatan eye drops.  3. Vitamin D.  4.  Calcium.  5. Multivitamin.  6. Diovan 160.  7. AcipHex.  8. Hyoscyamine for irritable bowel.  9. Triamterene/hydrochlorothiazide.  10.Phexaphenadine as needed.   MEDICAL PROBLEMS:  Include hypertension, dyslipidemia, endometriosis,  diverticulosis at colonoscopy.     Titus Dubin. Alwyn Ren, MD,FACP,FCCP  Electronically Signed    WFH/MedQ  DD: 01/23/2007  DT: 01/23/2007  Job #: [id number]

## 2011-04-09 NOTE — Letter (Signed)
March 13, 2007    __________.   RE:  Valerie Rollins, Valerie Rollins  MRN:  [id number]  /  DOB:  May 27, 1936    Dear Rosanne Ashing,   Enzo Bi has had several weeks of pain in the anteromedial shin area.  She states that it improves overnight or with elevation but is worse  with ambulation. There is no radiation of the pain.   On exam, the most striking finding are the severe varicose veins. She  does have some tenderness over the upper tibia but her symptoms are  medial & somewhat superior to this area.   In reviewing the MRI you had performed February 06, 2007 there was  suggestion of a possible healing insufficiency fracture of the posterior  medial tibial plateau versus a cartilage defect.   Would you be kind enough to reevaluate Valerie Rollins in view of these new  symptoms and the MRI findings.    Sincerely,      Titus Dubin. Alwyn Ren, MD,FACP,FCCP  Electronically Signed    WFH/MedQ  DD: 03/13/2007  DT: 03/13/2007  Job #: [id number]

## 2011-05-01 ENCOUNTER — Encounter: Payer: Self-pay | Admitting: Internal Medicine

## 2011-05-05 ENCOUNTER — Encounter: Payer: Self-pay | Admitting: Internal Medicine

## 2011-05-05 ENCOUNTER — Ambulatory Visit (INDEPENDENT_AMBULATORY_CARE_PROVIDER_SITE_OTHER): Payer: Medicare Other | Admitting: Internal Medicine

## 2011-05-05 DIAGNOSIS — Z0000 Encounter for general adult medical examination without abnormal findings: Secondary | ICD-10-CM

## 2011-05-05 DIAGNOSIS — K5730 Diverticulosis of large intestine without perforation or abscess without bleeding: Secondary | ICD-10-CM

## 2011-05-05 DIAGNOSIS — E785 Hyperlipidemia, unspecified: Secondary | ICD-10-CM

## 2011-05-05 DIAGNOSIS — Z85828 Personal history of other malignant neoplasm of skin: Secondary | ICD-10-CM

## 2011-05-05 DIAGNOSIS — K589 Irritable bowel syndrome without diarrhea: Secondary | ICD-10-CM

## 2011-05-05 DIAGNOSIS — I10 Essential (primary) hypertension: Secondary | ICD-10-CM

## 2011-05-05 MED ORDER — IPRATROPIUM-ALBUTEROL 18-103 MCG/ACT IN AERO: 1.0000 | INHALATION_SPRAY | RESPIRATORY_TRACT | Status: DC

## 2011-05-05 NOTE — Progress Notes (Signed)
Subjective:    Patient ID: Valerie Rollins, female    DOB: 1935/12/27, 75 y.o.   MRN: [id number]  HPI Medicare Wellness Visit:  The following psychosocial & medical history were reviewed as required by Medicare.   Social history: caffeine: occasional , alcohol:  no ,  tobacco use : never  & exercise : walks 4-5X/ week for 30-60 min.   Home & personal  safety / fall risk: no issues, activities of daily living: no limitations , seatbelt use : yes , and smoke alarm employment : yes .  Power of Attorney/ Living Will status : in place  Vision ( as recorded per Nurse) & Hearing  evaluation :  Wall chart read @ 6 ft with lenses ; whisper heard @ 6 ft. Orientation :oriented x 3 , memory & recall :good , spelling or math testing: good,and mood & affect : normal . Depression / anxiety: denied Travel history : never , immunization status :Shingles needed , transfusion history:  no, and preventive health surveillance ( colonoscopies, BMD , etc as per protocol/ [facility]): she received recall for Colonoscopy from Dr Marina Goodell, Dental care:  Seen every 6 mos . Chart reviewed &  Updated. Active issues reviewed & addressed.       Review of Systems Patient reports no vision/ hearing  changes, adenopathy,fever, weight change,  persistant  hoarseness , swallowing issues, chest pain,palpitations,edema,persistant /recurrent cough, hemoptysis, dyspnea( rest/ exertional/paroxysmal nocturnal), gastrointestinal bleeding(melena, rectal bleeding),  significant heartburn, GU symptoms(dysuria, hematuria,pyuria, incontinence ), Gyn symptoms(abnormal  bleeding , pain),  syncope, focal weakness, memory loss,numbness & tingling, skin/hair /nail changes,abnormal bruising or bleeding.Intermittent pain & bowel changes  IBS treated by Dr Marina Goodell      Objective:   Physical Exam Gen.: Healthy and well-nourished in appearance. Alert, appropriate and cooperative throughout exam. Head: Normocephalic without obvious abnormalities Eyes: No corneal or  conjunctival inflammation noted. Pupils equal round reactive to light and accommodation. Fundal exam is benign without hemorrhages, exudate, papilledema. Extraocular motion intact.  Ears: External  ear exam reveals no significant lesions or deformities. Canals clear .TMs normal. Hearing is grossly normal bilaterally. Nose: External nasal exam reveals no deformity or inflammation. Nasal mucosa are pink and moist. No lesions or exudates noted. Mouth: Oral mucosa and oropharynx reveal no lesions or exudates. Teeth in good repair. Neck: No deformities, masses, or tenderness noted. Range of motion &. Thyroid normal. Lungs: Normal respiratory effort; chest expands symmetrically. Lungs are clear to auscultation without rales, wheezes, or increased work of breathing. Heart: Normal rate and rhythm. Normal S1 and S2. No gallop, click, or rub. Grade 1.5 / 6 Aortic  murmur. Abdomen: Bowel sounds normal; abdomen soft and nontender. No masses, organomegaly or hernias noted. Genitalia: S/P  TAH / BSO.                                                                                      Musculoskeletal/extremities: No deformity or scoliosis noted of  the thoracic or lumbar spine. No clubbing, cyanosis or deformity noted.Lipedema of ankles. Range of motion  normal .Tone & strength  normal.Joints normal. Nail health  good. Vascular: Carotid, radial artery, dorsalis pedis and dorsalis  posterior tibial pulses are full and equal. No bruits present. Neurologic: Alert and oriented x3. Deep tendon reflexes symmetrical and normal.          Skin: Intact without suspicious lesions or rashes. Stasis pigmentation LLE > RLE. VV LLE > RLE. Lymph: No cervical, axillary, or inguinal lymphadenopathy present. Psych: Mood and affect are normal. Normally interactive                                                                                         Assessment & Plan:  #1 Medicare Wellness Exam; criteria met ; data entered #2  Problem List reviewed ; Assessment/ Recommendations made Plan: see Orders

## 2011-05-05 NOTE — Patient Instructions (Signed)
Preventive Health Care: Exercise  30-45  minutes a day, 3-4 days a week. Walking is especially valuable in preventing Osteoporosis. Eat a low-fat diet with lots of fruits and vegetables, up to 7-9 servings per day. Consume less than 30 grams of sugar per day from foods & drinks with High Fructose Corn Syrup as #2,3 or #4 on label.  

## 2011-05-05 NOTE — Assessment & Plan Note (Signed)
LDL goal = < 160, ideally < 120

## 2011-05-05 NOTE — Assessment & Plan Note (Signed)
Dr Suan Halter

## 2011-05-06 LAB — CBC WITH DIFFERENTIAL/PLATELET
Basophils Absolute: 0 10*3/uL (ref 0.0–0.1)
Eosinophils Absolute: 0.1 10*3/uL (ref 0.0–0.7)
Hemoglobin: 14 g/dL (ref 12.0–15.0)
Lymphocytes Relative: 40 % (ref 12.0–46.0)
Lymphs Abs: 2 10*3/uL (ref 0.7–4.0)
MCHC: 34 g/dL (ref 30.0–36.0)
MCV: 88.2 fl (ref 78.0–100.0)
Monocytes Absolute: 0.3 10*3/uL (ref 0.1–1.0)
Neutro Abs: 2.5 10*3/uL (ref 1.4–7.7)
RDW: 13 % (ref 11.5–14.6)

## 2011-05-06 LAB — HEPATIC FUNCTION PANEL
ALT: 13 U/L (ref 0–35)
Albumin: 4.5 g/dL (ref 3.5–5.2)
Total Protein: 7.5 g/dL (ref 6.0–8.3)

## 2011-05-06 LAB — LIPID PANEL
Cholesterol: 250 mg/dL — ABNORMAL HIGH (ref 0–200)
HDL: 83.1 mg/dL (ref >39.00)
Total CHOL/HDL Ratio: 3
Triglycerides: 86 mg/dL (ref 0.0–149.0)
VLDL: 17.2 mg/dL (ref 0.0–40.0)

## 2011-05-06 LAB — BASIC METABOLIC PANEL
CO2: 31 mEq/L (ref 19–32)
Calcium: 9.5 mg/dL (ref 8.4–10.5)
Chloride: 105 mEq/L (ref 96–112)
Creatinine, Ser: 0.9 mg/dL (ref 0.4–1.2)
Glucose, Bld: 83 mg/dL (ref 70–99)
Sodium: 144 mEq/L (ref 135–145)

## 2011-05-06 LAB — LDL CHOLESTEROL, DIRECT: Direct LDL: 145.4 mg/dL

## 2011-05-09 ENCOUNTER — Encounter: Payer: Self-pay | Admitting: Internal Medicine

## 2011-05-27 ENCOUNTER — Telehealth: Payer: Self-pay | Admitting: Internal Medicine

## 2011-05-27 NOTE — Telephone Encounter (Signed)
Patient states that she has been having multiple loose incontinent stools daily.  She states that Dr Marina Goodell told her last year that she "can take as much imodium is needed to control it".  She took 4 imodium this am and 9 yesterday and still having symptoms.  She will come in and see Dr Marina Goodell tomorrow at 2:30

## 2011-05-28 ENCOUNTER — Encounter: Payer: Self-pay | Admitting: Internal Medicine

## 2011-05-28 ENCOUNTER — Ambulatory Visit (INDEPENDENT_AMBULATORY_CARE_PROVIDER_SITE_OTHER): Payer: Medicare Other | Admitting: Internal Medicine

## 2011-05-28 VITALS — BP 132/86 | HR 68 | Ht 64.75 in | Wt 164.0 lb

## 2011-05-28 DIAGNOSIS — K219 Gastro-esophageal reflux disease without esophagitis: Secondary | ICD-10-CM

## 2011-05-28 DIAGNOSIS — R1031 Right lower quadrant pain: Secondary | ICD-10-CM

## 2011-05-28 DIAGNOSIS — R159 Full incontinence of feces: Secondary | ICD-10-CM

## 2011-05-28 DIAGNOSIS — R197 Diarrhea, unspecified: Secondary | ICD-10-CM

## 2011-05-28 MED ORDER — COLESTIPOL HCL 1 G PO TABS: 1.0000 g | ORAL_TABLET | Freq: Two times a day (BID) | ORAL | Status: DC

## 2011-05-28 MED ORDER — PEG-KCL-NACL-NASULF-NA ASC-C 100 G PO SOLR: 1.0000 | Freq: Once | ORAL | Status: DC

## 2011-05-28 NOTE — Progress Notes (Signed)
HISTORY OF PRESENT ILLNESS:  Valerie Rollins is a 75 y.o. female with hypertension, prior DVT, GERD, IBS, and intermittent fecal soilage due to pelvic floor relaxation. She was last evaluated in February 2010 regarding change in bowel habits. She presents today regarding fecal incontinence and right-sided abdominal discomfort. She has had both of these problems previously. She reports occasional problems with fecal seepage sometime after her bowel movement. She takes antidiarrheals with variable success. Occasional constipation. She wears protective undergarments. No weight loss or rectal bleeding. Last colonoscopy 10 years ago. She recently received a recall letter and is scheduled for the first Friday in August. This week she has had worsening problems with incontinence. Several episodes per day. Somewhat better today. GI review of systems otherwise negative  REVIEW OF SYSTEMS:  All non-GI ROS negative except for ankle edema.  Past Medical History  Diagnosis Date  . Diverticulitis 2002    based on CT scan  . DVT (deep vein thrombosis) in pregnancy 2000  . Hyperlipidemia   . Hypertension   . Superficial phlebitis     x2, 1999, 2000  . Ocular hypertension     glaucoma suspect, Dr. Dagoberto Ligas  . Skin cancer   . Endometriosis   . Abnormal menses   . Gastritis 2006    @ Endo  . Hiatal hernia   . Diverticulosis   . GERD (gastroesophageal reflux disease)     Past Surgical History  Procedure Date  . Total abdominal hysterectomy w/ bilateral salpingoophorectomy 1980    dysfunctional menses, endometriosis  . Varicose vein surgery     1960, 1965, 2009  . Tonsillectomy   . Cataract extraction w/ intraocular lens implant     OS  . Colonoscopy 1995, 2002    diverticulosis    Social History Valerie Rollins  reports that she has never smoked. She has never used smokeless tobacco. She reports that she does not drink alcohol or use illicit drugs.  family history includes Alzheimer's disease  in her mother; Colon cancer in her maternal grandmother; Diverticulitis in her daughter; Heart attack (age of onset:80) in her father; Heart attack (age of onset:82) in her paternal grandmother; Hyperlipidemia in her brother; and Hypertension in her brother.  Allergies  Allergen Reactions  . Alendronate Sodium   . Oxybutynin Chloride   . Propoxyphene N-Acetaminophen     REACTION: made pt blood to thin  . Ramipril     REACTION: cough       PHYSICAL EXAMINATION: Vital signs: BP 132/86  Pulse 68  Ht 5' 4.75" (1.645 m)  Wt 164 lb (74.39 kg)  BMI 27.50 kg/m2  Constitutional: generally well-appearing, no acute distress Psychiatric: alert and oriented x3, cooperative Eyes: extraocular movements intact, anicteric, conjunctiva pink Mouth: oral pharynx moist, no lesions Neck: supple no lymphadenopathy Cardiovascular: heart regular rate and rhythm, no murmur Lungs: clear to auscultation bilaterally Abdomen: soft, nontender, nondistended, no obvious ascites, no peritoneal signs, normal bowel sounds, no organomegaly Rectal: Small external hemorrhoid. Nontender. Decreased rectal tone. Sensation intact. Hemoccult negative stool Extremities: no lower extremity edema bilaterally Skin: no lesions on visible extremities Neuro: No focal deficits.   ASSESSMENT:  #1. Intermittent fecal incontinence due to pelvic floor relaxation and decreased rectal tone #2. IBS #3. GERD #4. Screening colonoscopy. Due.   PLAN:  #1. Trial of Colestid 1 g twice a day. Do not take within 2 hours of other meds #2. Continue with protective undergarments #3. Colonoscopy as planned

## 2011-05-28 NOTE — Patient Instructions (Addendum)
Your prescription has been sent to your pharmacy. Follow-up as needed and call if symptoms do not improve. Pre-visit appointment was canceled and colonoscopy instructions given to you today for 06/25/11. Moviprep sent to your pharmacy. Colonoscopy brochure given to you for review.

## 2011-05-31 ENCOUNTER — Encounter: Payer: Self-pay | Admitting: Internal Medicine

## 2011-05-31 ENCOUNTER — Ambulatory Visit (INDEPENDENT_AMBULATORY_CARE_PROVIDER_SITE_OTHER): Payer: Medicare Other | Admitting: Internal Medicine

## 2011-05-31 VITALS — BP 140/86 | HR 54 | Temp 98.5°F | Wt 169.8 lb

## 2011-05-31 DIAGNOSIS — K5730 Diverticulosis of large intestine without perforation or abscess without bleeding: Secondary | ICD-10-CM

## 2011-05-31 DIAGNOSIS — N390 Urinary tract infection, site not specified: Secondary | ICD-10-CM

## 2011-05-31 DIAGNOSIS — R1031 Right lower quadrant pain: Secondary | ICD-10-CM

## 2011-05-31 DIAGNOSIS — K589 Irritable bowel syndrome without diarrhea: Secondary | ICD-10-CM

## 2011-05-31 LAB — POCT URINALYSIS DIPSTICK
Blood, UA: NEGATIVE
Ketones, UA: NEGATIVE
Spec Grav, UA: 1.015
Urobilinogen, UA: 0.2
pH, UA: 6

## 2011-05-31 MED ORDER — TRAMADOL HCL 50 MG PO TABS: 50.0000 mg | ORAL_TABLET | Freq: Four times a day (QID) | ORAL | Status: DC | PRN

## 2011-05-31 NOTE — Progress Notes (Signed)
  Subjective:    Patient ID: Valerie Rollins, female    DOB: [birth year], 75 y.o.   MRN: [id number]  HPI ABDOMINAL PAIN  Location: RLQ Onset: 7/3   Radiation: no  Severity: up to 5 Quality: "aggravating"  Duration: constant Progression: ? improving  Better with: position change  Worse with: no factor Symptoms: Nausea/Vomiting: no  Diarrhea: no, but leakage of loose stool;Dr Marina Goodell saw her 7/5 & Rxed  Colestipol  Constipation: no but  BM since  ? Melena/BRBPR: no  Anorexia: decreased appetite  Fever/Chills: no  Dysuria/hematuria/pyuria: no  Rash: no  Wt loss: no  Vaginal discharge/ bleeding: no  Past Surgeries: TAH & BSO for dysmenorrhea & Endometriosis; last colonoscopy 2002. F/U 08/12.PMH of Diverticulosis.  Dr. Atilano Median, her chiropractor states that she has a tilted pelvis and there is a discrepancy in the length of her legs of 1& 1/2 inches. FH: daughter :partial colectomy for diverticulitis       Review of Systems     Objective:   Physical Exam General appearance is one of good health and nourishment w/o distress.  Eyes: No conjunctival inflammation or scleral icterus is present.  Oral exam: Dental hygiene is good; lips and gums are healthy appearing.There is no oropharyngeal erythema or exudate noted.   Heart:  Normal rate and regular rhythm. S1 and S2 normal without gallop,  click, rub or other extra sounds  . Grade 1.5 / 6 systolic murmur   Lungs:Chest clear to auscultation; no wheezes, rhonchi,rales ,or rubs present.No increased work of breathing.   Abdomen: bowel sounds normal, soft and non-tender without masses, organomegaly or hernias noted.  No guarding or rebound   Skin:Warm & dry.  Intact without suspicious lesions or rashes ; no jaundice or tenting  Lymphatic: No lymphadenopathy is noted about the head, neck, axilla    Musculoskeletal: There is full range of motion at the right hip and negative straight leg raising . Deep tendon reflexes are equal in the  knees and strength of opposition is good              Assessment & Plan:  #1 right lower quadrant discomfort/pain. Clinically there is nothing to suggest appendicitis.  #2 abnormal urine (trace leukocytes)  #3 pelvic tilt and asymmetry in leg  length; clinically there is no evidence of radiculopathy  Plan: C&S of the urine. Tramadol as needed

## 2011-05-31 NOTE — Patient Instructions (Signed)
Stay on clear liquids for 48-72 hours or until bowels are normal.This would include  jello, sherbert (NOT ice cream), Lipton's chicken noodle soup(NOT cream based soups),Gatorade Lite, flat Ginger ale (without High Fructose Corn Syrup),dry toast or crackers, baked potato.No milk , dairy or grease until bowels are  normal

## 2011-06-04 ENCOUNTER — Other Ambulatory Visit: Payer: Self-pay

## 2011-06-04 MED ORDER — NITROFURANTOIN MACROCRYSTAL 100 MG PO CAPS: 100.0000 mg | ORAL_CAPSULE | Freq: Two times a day (BID) | ORAL | Status: DC

## 2011-06-04 NOTE — Telephone Encounter (Signed)
Spoke with patient's husband and informed him rx sent to pharmacy for UTI and patient is to pick up and begin today, information ok'd

## 2011-06-22 ENCOUNTER — Other Ambulatory Visit: Payer: Medicare Other | Admitting: Internal Medicine

## 2011-06-23 ENCOUNTER — Telehealth: Payer: Self-pay | Admitting: Internal Medicine

## 2011-06-23 NOTE — Telephone Encounter (Signed)
Pt. Advised it was not necessary to take antibiotic pro phylactically for colon.  Valerie Rollins

## 2011-06-23 NOTE — Telephone Encounter (Signed)
Patient is having colonoscopy Friday [id number] - she wanted to know if she would need to take amoxicillian before procedure  - she said she takes antibiotic  before a dental appt because she has been told she has a heart murmer - patient has 4 pills (amoxicillian)

## 2011-06-23 NOTE — Telephone Encounter (Signed)
pls advise

## 2011-06-23 NOTE — Telephone Encounter (Signed)
Left detailed message for pt that she did not need any ATBs before colonoscopy.

## 2011-06-23 NOTE — Telephone Encounter (Signed)
Not needed for colonoscopy

## 2011-06-24 ENCOUNTER — Encounter: Payer: Medicare Other | Admitting: Internal Medicine

## 2011-06-25 ENCOUNTER — Encounter: Payer: Self-pay | Admitting: Internal Medicine

## 2011-06-25 ENCOUNTER — Ambulatory Visit (AMBULATORY_SURGERY_CENTER): Payer: Medicare Other | Admitting: Internal Medicine

## 2011-06-25 ENCOUNTER — Other Ambulatory Visit: Payer: Medicare Other | Admitting: Internal Medicine

## 2011-06-25 VITALS — BP 148/73 | HR 50 | Temp 97.7°F | Resp 21 | Ht 64.0 in | Wt 160.0 lb

## 2011-06-25 DIAGNOSIS — Z1211 Encounter for screening for malignant neoplasm of colon: Secondary | ICD-10-CM

## 2011-06-25 DIAGNOSIS — R197 Diarrhea, unspecified: Secondary | ICD-10-CM

## 2011-06-25 DIAGNOSIS — R1031 Right lower quadrant pain: Secondary | ICD-10-CM

## 2011-06-25 DIAGNOSIS — K5730 Diverticulosis of large intestine without perforation or abscess without bleeding: Secondary | ICD-10-CM

## 2011-06-25 MED ORDER — SODIUM CHLORIDE 0.9 % IV SOLN: 500.0000 mL | Freq: Once | INTRAVENOUS | Status: DC

## 2011-06-25 NOTE — Progress Notes (Signed)
No complaints on discharge.  MAW 

## 2011-06-25 NOTE — Patient Instructions (Signed)
Refer to the picture page for the findings of the colonoscopy.  Please follow the green and blue discharge instruction sheets the rest of the day.  Resume your prior medications today.  Call if any questions or concerns.

## 2011-06-25 NOTE — Progress Notes (Signed)
PT IN SINUS BRADY WHEN ATTACHED TO MONITOR. AFTER TURNING ON LEFT SIDE PT IN SINUS BRADY WITH PVC'S. PT GOING IN AND OUT OF THESE TWO RHYTHMS AFTER SEDATION STARTED.  EWM

## 2011-06-28 ENCOUNTER — Telehealth: Payer: Self-pay | Admitting: *Deleted

## 2011-06-28 NOTE — Telephone Encounter (Signed)
No answer at number left and no identifiers on machine so no message left for pt. EWM

## 2011-07-14 ENCOUNTER — Other Ambulatory Visit: Payer: Self-pay | Admitting: Internal Medicine

## 2011-07-20 ENCOUNTER — Other Ambulatory Visit: Payer: Self-pay | Admitting: Internal Medicine

## 2011-08-23 ENCOUNTER — Encounter: Payer: Self-pay | Admitting: Internal Medicine

## 2011-08-23 ENCOUNTER — Ambulatory Visit (INDEPENDENT_AMBULATORY_CARE_PROVIDER_SITE_OTHER): Payer: Medicare Other | Admitting: Internal Medicine

## 2011-08-23 DIAGNOSIS — R300 Dysuria: Secondary | ICD-10-CM

## 2011-08-23 DIAGNOSIS — M549 Dorsalgia, unspecified: Secondary | ICD-10-CM

## 2011-08-23 LAB — POCT URINALYSIS DIPSTICK
Bilirubin, UA: NEGATIVE
Blood, UA: NEGATIVE
Ketones, UA: NEGATIVE
Nitrite, UA: NEGATIVE
pH, UA: 6

## 2011-08-23 MED ORDER — PHENAZOPYRIDINE HCL 200 MG PO TABS: 200.0000 mg | ORAL_TABLET | Freq: Three times a day (TID) | ORAL | Status: DC | PRN

## 2011-08-23 MED ORDER — MONTELUKAST SODIUM 10 MG PO TABS: 10.0000 mg | ORAL_TABLET | Freq: Every day | ORAL | Status: DC

## 2011-08-23 MED ORDER — OMEPRAZOLE 20 MG PO CPDR: 20.0000 mg | DELAYED_RELEASE_CAPSULE | Freq: Every day | ORAL | Status: DC

## 2011-08-23 NOTE — Progress Notes (Signed)
  Subjective:    Patient ID: Valerie Rollins, female    DOB: 26-Sep-1936, 75 y.o.   MRN: [id number]  HPI DYSURIA: Onset: several weeks    Worsening: stable  But intermittently worse w/o trigger  Symptoms Urgency: no  Frequency: yes,   Hesitancy: no  Hematuria: no  Flank Pain: no, Fever: no    Nausea/Vomiting: no  Discharge: no;no PMH of vaginitis Irritants: no Rash: no  Red Flags  : (Risk Factors for Complicated UTI) Recent Antibiotic Usage (last 30 days): no  Symptoms lasting more than seven (7) days: yes  More than 3 UTI's last 12 months: no  PMH of  1. DM: no 2. Renal Disease/Calculi: no 3. Urinary Tract Abnormality: no  4. Instrumentation/Trauma: yes, urethra stretched remotely @ least  once 5. Immunosuppression: no      Review of Systems   She is requesting refills for Singulair and omeprazole     Objective:   Physical Exam General appearance is one of good health and nourishment w/o distress.  Eyes: No conjunctival inflammation or scleral icterus is present.     Heart:  Normal rate and regular rhythm. S1 and S2 normal without gallop, murmur, click, rub .S 4 with slurring  Lungs:Chest clear to auscultation; no wheezes, rhonchi,rales ,or rubs present.No increased work of breathing.   Abdomen: bowel sounds normal, soft and non-tender without masses, organomegaly or hernias noted.  No guarding or rebound . No significant flank tenderness  Skin:Warm & dry.  Intact without suspicious lesions or rashes ; no jaundice or tenting  Lymphatic: No lymphadenopathy is noted about the head, neck, axilla  areas.              Assessment & Plan:  #1 dysuria, urinalysis is normal.  Plan: Pyridium pending return of the culture. If the culture is negative for urinary tract infection, urology follow up would be indicated.

## 2011-08-23 NOTE — Patient Instructions (Signed)
Force NON dairy fluids for next 48 hrs.  

## 2011-09-23 ENCOUNTER — Ambulatory Visit (INDEPENDENT_AMBULATORY_CARE_PROVIDER_SITE_OTHER): Payer: Medicare Other

## 2011-09-23 DIAGNOSIS — Z23 Encounter for immunization: Secondary | ICD-10-CM

## 2011-10-07 ENCOUNTER — Telehealth: Payer: Self-pay | Admitting: *Deleted

## 2011-10-07 ENCOUNTER — Emergency Department (HOSPITAL_COMMUNITY)
Admission: EM | Admit: 2011-10-07 | Discharge: 2011-10-07 | Disposition: A | Discharge status: Home / Self Care | Payer: Medicare Other | Source: Home / Self Care | Attending: Family Medicine | Admitting: Family Medicine

## 2011-10-07 ENCOUNTER — Encounter (HOSPITAL_COMMUNITY): Payer: Self-pay | Admitting: *Deleted

## 2011-10-07 DIAGNOSIS — K529 Noninfective gastroenteritis and colitis, unspecified: Secondary | ICD-10-CM

## 2011-10-07 DIAGNOSIS — K5289 Other specified noninfective gastroenteritis and colitis: Secondary | ICD-10-CM

## 2011-10-07 MED ORDER — ONDANSETRON 4 MG PO TBDP
8.0000 mg | ORAL_TABLET | Freq: Once | ORAL | Status: AC
  Administered 2011-10-07: 8 mg via ORAL

## 2011-10-07 MED ORDER — ONDANSETRON 4 MG PO TBDP
ORAL_TABLET | ORAL | Status: AC
  Filled 2011-10-07: qty 2

## 2011-10-07 MED ORDER — ONDANSETRON HCL 4 MG PO TABS: 4.0000 mg | ORAL_TABLET | Freq: Four times a day (QID) | ORAL | Status: DC

## 2011-10-07 NOTE — ED Provider Notes (Signed)
History     CSN: [id number] Arrival date & time: 10/07/2011  3:35 PM   First MD Initiated Contact with Patient 10/07/11 1544      Chief Complaint  Patient presents with  . Abdominal Pain    (Consider location/radiation/quality/duration/timing/severity/associated sxs/prior treatment) Patient is a 75 y.o. female presenting with abdominal pain. The history is provided by the patient and the spouse.  Abdominal Pain The primary symptoms of the illness include abdominal pain, nausea, vomiting and diarrhea. The current episode started 1 to 2 hours ago. The onset of the illness was sudden. The problem has been rapidly improving.  The patient states that she believes she is currently not pregnant. The patient has not had a change in bowel habit. Additional symptoms associated with the illness include diaphoresis. Symptoms associated with the illness do not include anorexia, constipation or urgency. Significant associated medical issues include diverticulitis.    Past Medical History  Diagnosis Date  . Diverticulitis 2002    based on CT scan  . DVT (deep vein thrombosis) in pregnancy 2000  . Hyperlipidemia   . Hypertension   . Superficial phlebitis     x2, 1999, 2000  . Ocular hypertension     glaucoma suspect, Dr. Dagoberto Ligas  . Skin cancer   . Endometriosis   . Abnormal menses   . Gastritis 2006    @ Endo  . Hiatal hernia   . Diverticulosis   . GERD (gastroesophageal reflux disease)     Past Surgical History  Procedure Date  . Total abdominal hysterectomy w/ bilateral salpingoophorectomy 1980    dysfunctional menses, endometriosis  . Varicose vein surgery     1960, 1965, 2009  . Tonsillectomy   . Cataract extraction w/ intraocular lens implant     OS  . Colonoscopy 1995, 2002    diverticulosis    Family History  Problem Relation Age of Onset  . Heart attack Father 4  . Heart disease Father 90    MI   . Alzheimer's disease Mother     TIAs  . Mental illness Mother      ALSHEIMERS  . Hyperlipidemia Brother   . Hypertension Brother   . Diverticulitis Daughter     S/P colectomy  . Colon cancer Maternal Grandmother   . Cancer Maternal Grandmother     COLON  . Heart attack Paternal Grandmother 42    History  Substance Use Topics  . Smoking status: Never Smoker   . Smokeless tobacco: Never Used  . Alcohol Use: No    OB History    Grav Para Term Preterm Abortions TAB SAB Ect Mult Living                  Review of Systems  Constitutional: Positive for diaphoresis.  Gastrointestinal: Positive for nausea, vomiting, abdominal pain and diarrhea. Negative for constipation and anorexia.  Genitourinary: Negative for urgency.    Allergies  Oxybutynin chloride; Propoxyphene n-acetaminophen; Alendronate sodium; and Ramipril  Home Medications   Current Outpatient Rx  Name Route Sig Dispense Refill  . IPRATROPIUM-ALBUTEROL 18-103 MCG/ACT IN AERO Inhalation Inhale 1 puff into the lungs as directed. 1-2 puffs every 4-6hrs prn cough/SOB 14.7 g 11  . ASPIRIN 325 MG PO TABS Oral Take 325 mg by mouth every other day.     Marland Kitchen CARVEDILOL 12.5 MG PO TABS  TAKE ONE TABLET BY MOUTH TWICE DAILY 60 tablet 5  . COLESTIPOL HCL 1 G PO TABS Oral Take 1 tablet (1 g total)  by mouth 2 (two) times daily. 60 tablet 3  . DICYCLOMINE HCL 10 MG PO CAPS Oral Take 10 mg by mouth every 6 (six) hours as needed.      Marland Kitchen FEXOFENADINE HCL 180 MG PO TABS Oral Take 180 mg by mouth as needed.      Marland Kitchen FLUTICASONE PROPIONATE 50 MCG/ACT NA SUSP Nasal Place 1 spray into the nose 2 (two) times daily as needed.     . FUROSEMIDE 20 MG PO TABS Oral Take 20 mg by mouth 2 (two) times daily.     Marland Kitchen LATANOPROST 0.005 % OP SOLN Both Eyes Place 1 drop into both eyes daily.      Marland Kitchen LOSARTAN POTASSIUM 100 MG PO TABS Oral Take 100 mg by mouth daily.      Marland Kitchen MONTELUKAST SODIUM 10 MG PO TABS Oral Take 1 tablet (10 mg total) by mouth at bedtime. 90 tablet 1  . OMEPRAZOLE 20 MG PO CPDR Oral Take 1 capsule (20  mg total) by mouth daily. 90 capsule 1  . PHENAZOPYRIDINE HCL 200 MG PO TABS Oral Take 1 tablet (200 mg total) by mouth 3 (three) times daily as needed for pain. 15 tablet 0  . TRAMADOL HCL 50 MG PO TABS Oral Take 1 tablet (50 mg total) by mouth every 6 (six) hours as needed for pain. 30 tablet 0    BP 137/68  Pulse 56  Temp(Src) 98 F (36.7 C) (Oral)  Resp 18  SpO2 100%  Physical Exam  Constitutional: She appears well-developed and well-nourished.  HENT:  Head: Normocephalic.  Right Ear: External ear normal.  Left Ear: External ear normal.  Eyes: Conjunctivae and EOM are normal. Pupils are equal, round, and reactive to light.  Neck: Normal range of motion. Neck supple.  Cardiovascular: Normal rate, regular rhythm, normal heart sounds and intact distal pulses.   Pulmonary/Chest: Effort normal and breath sounds normal.  Abdominal: Soft. Bowel sounds are normal. She exhibits no distension and no mass. There is no tenderness. There is no rebound and no guarding.  Skin: Skin is warm and dry.    ED Course  Procedures (including critical care time)  Labs Reviewed - No data to display No results found.   No diagnosis found.    MDM          Barkley Bruns, MD 10/07/11 [phone]

## 2011-10-07 NOTE — ED Notes (Signed)
PT  REPORTS  LOW  ABD  PAIN     WITH  SOME  NAUSEA     AND     VOMITED    X  3       DIARRHEA        AS  WELL           GOT   WEAK  AND  DIZZY  WHILE  AT  THE      HAIRDRESSORS      TODAY      GOT  PALE  AND  CLAMMY  TO  TOUCH      FEELS  BETTER

## 2011-10-07 NOTE — Telephone Encounter (Signed)
Received call from pt's daughter stating her mom just became sick with abdominal cramping, n/v, and weakness. Pt denies chest pain or difficulty breathing.  Advised pt's daughter that pt should be evaluated in the ER. She states she will call her dad and they will decide how to proceed.

## 2011-10-08 ENCOUNTER — Telehealth (HOSPITAL_COMMUNITY): Payer: Self-pay | Admitting: *Deleted

## 2011-10-08 NOTE — ED Notes (Signed)
Discussed pt.'s concerns with Dr. Artis Flock and he said to tell pt. It was probably a small tear. Continue clear liquids and Zofran.  F/U with Dr. Alwyn Ren on Mon. as planned.  Pt. given these instructions.  She said she does not have nausea now but dull constant pain in R and LLQ of abdomen 3/10.  Discussed with Dr. Artis Flock.  He said pt. can use Tylenol but no Ibuprofen.  Pt. should go to ED if any worsening symptoms.  Pt. Given these instructions. Not enough room to put this on my telephone encounter.  Valerie Rollins 10/08/2011

## 2011-10-11 ENCOUNTER — Encounter: Payer: Self-pay | Admitting: Internal Medicine

## 2011-10-11 ENCOUNTER — Ambulatory Visit (INDEPENDENT_AMBULATORY_CARE_PROVIDER_SITE_OTHER): Payer: Medicare Other | Admitting: Internal Medicine

## 2011-10-11 DIAGNOSIS — R102 Pelvic and perineal pain: Secondary | ICD-10-CM

## 2011-10-11 DIAGNOSIS — R110 Nausea: Secondary | ICD-10-CM

## 2011-10-11 DIAGNOSIS — R109 Unspecified abdominal pain: Secondary | ICD-10-CM

## 2011-10-11 DIAGNOSIS — K625 Hemorrhage of anus and rectum: Secondary | ICD-10-CM

## 2011-10-11 LAB — BASIC METABOLIC PANEL
Calcium: 9.2 mg/dL (ref 8.4–10.5)
GFR: 63.57 mL/min (ref >60.00)
Glucose, Bld: 92 mg/dL (ref 70–99)
Sodium: 140 mEq/L (ref 135–145)

## 2011-10-11 LAB — CBC WITH DIFFERENTIAL/PLATELET
Basophils Absolute: 0 10*3/uL (ref 0.0–0.1)
Hemoglobin: 13.6 g/dL (ref 12.0–15.0)
Lymphocytes Relative: 23.4 % (ref 12.0–46.0)
Monocytes Relative: 7.2 % (ref 3.0–12.0)
Neutro Abs: 5.1 10*3/uL (ref 1.4–7.7)
RBC: 4.48 Mil/uL (ref 3.87–5.11)
RDW: 13.1 % (ref 11.5–14.6)

## 2011-10-11 NOTE — Progress Notes (Signed)
  Subjective:    Patient ID: Valerie Rollins, female    DOB: 1935-11-28, 75 y.o.   MRN: [id number]  HPI ABDOMINAL PAIN: seen @ [facility] ; 11/15 records reviewed Location: suprapubic  Onset: 11/15   Radiation: no  Severity: up to 8; last 11/18 as "gas" Quality: sharp  Duration: seconds  Better with: time; nausea meds Rxed not taken Symptoms Nausea/Vomiting: yes, nausea w/o vomiting  Diarrhea: yes, watery X 1 on 11/15 & in UC  Constipation: no  Melena/BRBPR: yes, rectal bleeding with toilet  use since 11/15 ; no BM until 11/18 as small pellets. No blood X 2 days  Hematemesis: no  Anorexia: yes,   Fever/Chills: no but "couldn't get warm" Dysuria/ pyuria/ hematuria: no  Wt loss: yes, 7#  PMH : Diverticulitis 2002; colonoscopy 06/25/2011: negative except for "severe diverticulosis"  Family history: Her daughter had 16 inches of her bowel removed for severe diverticulitis; maternal grandmother had colon cancer     Review of Systems     Objective:   Physical Exam Gen.: Healthy and well-nourished in appearance. Alert, appropriate and cooperative throughout exam.  Eyes: No corneal or conjunctival inflammation noted. No scleral icterus Mouth: Oral mucosa and oropharynx reveal no lesions or exudates. Teeth in good repair. Tongue coated  Lungs: Normal respiratory effort; chest expands symmetrically. Lungs are clear to auscultation without rales, wheezes, or increased work of breathing. Heart: Normal rate and rhythm. Normal S1 and S2. No gallop, click, or rub. Grade 1/6  murmur. Abdomen: Bowel sounds normal; abdomen soft . Minimal epigastric tenderness. No masses, organomegaly or hernias noted.                                                            Musculoskeletal/extremities:  No clubbing, cyanosis, edema noted. Slight lipidedema .Joints normal. Nail health  good. Neurologic: Alert and oriented x3.  Skin: Intact without suspicious lesions or rashes. Some tenting  Lymph: No cervical, axillary  lymphadenopathy present. Psych: Mood and affect are normal. Normally interactive                                                                                         Assessment & Plan:  #1 suprapubic abdominal pain, essentially resolved  #2 rectal bleeding, resolved  #3 severe diverticulosis at colonoscopy 8/12  Plan: Labs will be checked; if symptoms recur or persist, definitive treatment for diverticulitis will be initiated

## 2011-10-11 NOTE — Patient Instructions (Signed)
Stay on clear liquids for 12 more  hours  or until bowels are normal.This would include  jello, sherbert (NOT ice cream), Lipton's chicken noodle soup(NOT cream based soups),Gatorade Lite, flat Ginger ale (without High Fructose Corn Syrup),dry toast or crackers, baked potato.No milk , dairy or grease until bowels are formed. Align , a [employer] , daily if stools are loose. Immodium AD for frankly watery stool. Report increasing pain, fever or rectal bleeding

## 2011-11-08 ENCOUNTER — Encounter: Payer: Self-pay | Admitting: Internal Medicine

## 2011-11-12 ENCOUNTER — Emergency Department (HOSPITAL_COMMUNITY): Payer: Medicare Other

## 2011-11-12 ENCOUNTER — Inpatient Hospital Stay (HOSPITAL_COMMUNITY)
Admission: EM | Admit: 2011-11-12 | Discharge: 2011-11-18 | DRG: 492 | Disposition: A | Discharge status: Skilled Nursing Facility | Payer: Medicare Other | Attending: Internal Medicine | Admitting: Internal Medicine

## 2011-11-12 ENCOUNTER — Other Ambulatory Visit: Payer: Self-pay

## 2011-11-12 ENCOUNTER — Encounter (HOSPITAL_COMMUNITY): Payer: Self-pay

## 2011-11-12 DIAGNOSIS — I10 Essential (primary) hypertension: Secondary | ICD-10-CM | POA: Diagnosis present

## 2011-11-12 DIAGNOSIS — J189 Pneumonia, unspecified organism: Secondary | ICD-10-CM

## 2011-11-12 DIAGNOSIS — K219 Gastro-esophageal reflux disease without esophagitis: Secondary | ICD-10-CM

## 2011-11-12 DIAGNOSIS — Z8719 Personal history of other diseases of the digestive system: Secondary | ICD-10-CM

## 2011-11-12 DIAGNOSIS — S82892A Other fracture of left lower leg, initial encounter for closed fracture: Secondary | ICD-10-CM

## 2011-11-12 DIAGNOSIS — R490 Dysphonia: Secondary | ICD-10-CM

## 2011-11-12 DIAGNOSIS — Y92009 Unspecified place in unspecified non-institutional (private) residence as the place of occurrence of the external cause: Secondary | ICD-10-CM

## 2011-11-12 DIAGNOSIS — I4510 Unspecified right bundle-branch block: Secondary | ICD-10-CM

## 2011-11-12 DIAGNOSIS — R197 Diarrhea, unspecified: Secondary | ICD-10-CM

## 2011-11-12 DIAGNOSIS — I4891 Unspecified atrial fibrillation: Secondary | ICD-10-CM

## 2011-11-12 DIAGNOSIS — S82853A Displaced trimalleolar fracture of unspecified lower leg, initial encounter for closed fracture: Principal | ICD-10-CM | POA: Diagnosis present

## 2011-11-12 DIAGNOSIS — I951 Orthostatic hypotension: Secondary | ICD-10-CM | POA: Diagnosis present

## 2011-11-12 DIAGNOSIS — R011 Cardiac murmur, unspecified: Secondary | ICD-10-CM

## 2011-11-12 DIAGNOSIS — K5730 Diverticulosis of large intestine without perforation or abscess without bleeding: Secondary | ICD-10-CM

## 2011-11-12 DIAGNOSIS — S82843A Displaced bimalleolar fracture of unspecified lower leg, initial encounter for closed fracture: Secondary | ICD-10-CM

## 2011-11-12 DIAGNOSIS — Z86718 Personal history of other venous thrombosis and embolism: Secondary | ICD-10-CM

## 2011-11-12 DIAGNOSIS — W010XXA Fall on same level from slipping, tripping and stumbling without subsequent striking against object, initial encounter: Secondary | ICD-10-CM | POA: Diagnosis present

## 2011-11-12 DIAGNOSIS — R55 Syncope and collapse: Secondary | ICD-10-CM

## 2011-11-12 DIAGNOSIS — Z85828 Personal history of other malignant neoplasm of skin: Secondary | ICD-10-CM

## 2011-11-12 DIAGNOSIS — D649 Anemia, unspecified: Secondary | ICD-10-CM | POA: Diagnosis present

## 2011-11-12 DIAGNOSIS — E876 Hypokalemia: Secondary | ICD-10-CM

## 2011-11-12 DIAGNOSIS — I8390 Asymptomatic varicose veins of unspecified lower extremity: Secondary | ICD-10-CM

## 2011-11-12 DIAGNOSIS — M1990 Unspecified osteoarthritis, unspecified site: Secondary | ICD-10-CM

## 2011-11-12 DIAGNOSIS — S93419A Sprain of calcaneofibular ligament of unspecified ankle, initial encounter: Secondary | ICD-10-CM | POA: Diagnosis present

## 2011-11-12 DIAGNOSIS — E785 Hyperlipidemia, unspecified: Secondary | ICD-10-CM

## 2011-11-12 DIAGNOSIS — K589 Irritable bowel syndrome without diarrhea: Secondary | ICD-10-CM

## 2011-11-12 DIAGNOSIS — I8000 Phlebitis and thrombophlebitis of superficial vessels of unspecified lower extremity: Secondary | ICD-10-CM

## 2011-11-12 HISTORY — DX: Other fracture of left lower leg, initial encounter for closed fracture: S82.892A

## 2011-11-12 HISTORY — DX: Syncope and collapse: R55

## 2011-11-12 HISTORY — DX: Acute embolism and thrombosis of unspecified deep veins of unspecified lower extremity: I82.409

## 2011-11-12 LAB — CBC
HCT: 36.2 % (ref 36.0–46.0)
Hemoglobin: 12.5 g/dL (ref 12.0–15.0)
MCHC: 34.5 g/dL (ref 30.0–36.0)

## 2011-11-12 LAB — COMPREHENSIVE METABOLIC PANEL
BUN: 12 mg/dL (ref 6–23)
CO2: 26 mEq/L (ref 19–32)
Chloride: 99 mEq/L (ref 96–112)
Creatinine, Ser: 0.73 mg/dL (ref 0.50–1.10)
GFR calc Af Amer: >90 mL/min (ref >90)
GFR calc non Af Amer: 83 mL/min — ABNORMAL LOW (ref >90)
Glucose, Bld: 127 mg/dL — ABNORMAL HIGH (ref 70–99)
Total Bilirubin: 0.7 mg/dL (ref 0.3–1.2)

## 2011-11-12 LAB — TROPONIN I: Troponin I: <0.3 ng/mL (ref <0.30)

## 2011-11-12 LAB — DIFFERENTIAL
Basophils Relative: 0 % (ref 0–1)
Lymphocytes Relative: 9 % — ABNORMAL LOW (ref 12–46)
Lymphs Abs: 1.1 10*3/uL (ref 0.7–4.0)
Monocytes Absolute: 0.8 10*3/uL (ref 0.1–1.0)
Monocytes Relative: 7 % (ref 3–12)
Neutro Abs: 10.5 10*3/uL — ABNORMAL HIGH (ref 1.7–7.7)

## 2011-11-12 LAB — CARDIAC PANEL(CRET KIN+CKTOT+MB+TROPI)
Relative Index: INVALID (ref 0.0–2.5)
Total CK: 59 U/L (ref 7–177)

## 2011-11-12 LAB — CK TOTAL AND CKMB (NOT AT ARMC): Total CK: 41 U/L (ref 7–177)

## 2011-11-12 LAB — PHOSPHORUS: Phosphorus: 2.5 mg/dL (ref 2.3–4.6)

## 2011-11-12 MED ORDER — COLESTIPOL HCL 1 G PO TABS
1.0000 g | ORAL_TABLET | Freq: Two times a day (BID) | ORAL | Status: DC
  Administered 2011-11-12 – 2011-11-18 (×10): 1 g via ORAL
  Filled 2011-11-12 (×14): qty 1

## 2011-11-12 MED ORDER — DOCUSATE SODIUM 100 MG PO CAPS
100.0000 mg | ORAL_CAPSULE | Freq: Two times a day (BID) | ORAL | Status: DC
  Administered 2011-11-12 – 2011-11-18 (×11): 100 mg via ORAL
  Filled 2011-11-12 (×13): qty 1

## 2011-11-12 MED ORDER — CARVEDILOL 12.5 MG PO TABS
12.5000 mg | ORAL_TABLET | Freq: Two times a day (BID) | ORAL | Status: DC
  Administered 2011-11-12 – 2011-11-15 (×6): 12.5 mg via ORAL
  Filled 2011-11-12 (×7): qty 1

## 2011-11-12 MED ORDER — HYDROMORPHONE HCL PF 1 MG/ML IJ SOLN
1.0000 mg | Freq: Once | INTRAMUSCULAR | Status: AC
  Administered 2011-11-12: 1 mg via INTRAVENOUS
  Filled 2011-11-12: qty 1

## 2011-11-12 MED ORDER — DEXTROSE 5 % IV SOLN
1.0000 g | INTRAVENOUS | Status: DC
  Administered 2011-11-13: 1 g via INTRAVENOUS
  Filled 2011-11-12 (×2): qty 10

## 2011-11-12 MED ORDER — LOSARTAN POTASSIUM 50 MG PO TABS
100.0000 mg | ORAL_TABLET | Freq: Every day | ORAL | Status: DC
  Administered 2011-11-12: 100 mg via ORAL
  Filled 2011-11-12 (×3): qty 2

## 2011-11-12 MED ORDER — OXYCODONE HCL 5 MG PO TABS
5.0000 mg | ORAL_TABLET | ORAL | Status: DC | PRN
  Administered 2011-11-13 – 2011-11-18 (×19): 5 mg via ORAL
  Filled 2011-11-12 (×20): qty 1

## 2011-11-12 MED ORDER — ZOLPIDEM TARTRATE 5 MG PO TABS: 5.0000 mg | ORAL_TABLET | Freq: Every evening | ORAL | Status: DC | PRN

## 2011-11-12 MED ORDER — ENOXAPARIN SODIUM 40 MG/0.4ML ~~LOC~~ SOLN
40.0000 mg | SUBCUTANEOUS | Status: DC
  Administered 2011-11-12: 40 mg via SUBCUTANEOUS
  Filled 2011-11-12 (×3): qty 0.4

## 2011-11-12 MED ORDER — SODIUM CHLORIDE 0.9 % IV BOLUS (SEPSIS)
500.0000 mL | Freq: Once | INTRAVENOUS | Status: AC
  Administered 2011-11-12: 1000 mL via INTRAVENOUS

## 2011-11-12 MED ORDER — HYDROMORPHONE HCL PF 1 MG/ML IJ SOLN
1.0000 mg | INTRAMUSCULAR | Status: DC | PRN
Start: 2011-11-12 — End: 2011-11-18
  Administered 2011-11-12 – 2011-11-17 (×17): 1 mg via INTRAVENOUS
  Filled 2011-11-12 (×17): qty 1

## 2011-11-12 MED ORDER — ONDANSETRON 4 MG PO TBDP
4.0000 mg | ORAL_TABLET | Freq: Once | ORAL | Status: AC
  Administered 2011-11-12: 4 mg via ORAL
  Filled 2011-11-12: qty 1

## 2011-11-12 MED ORDER — ALUM & MAG HYDROXIDE-SIMETH 200-200-20 MG/5ML PO SUSP: 30.0000 mL | Freq: Four times a day (QID) | ORAL | Status: DC | PRN

## 2011-11-12 MED ORDER — ONDANSETRON HCL 4 MG PO TABS: 4.0000 mg | ORAL_TABLET | Freq: Four times a day (QID) | ORAL | Status: DC | PRN

## 2011-11-12 MED ORDER — ADULT MULTIVITAMIN W/MINERALS CH
1.0000 | ORAL_TABLET | Freq: Every day | ORAL | Status: DC
  Administered 2011-11-12 – 2011-11-18 (×6): 1 via ORAL
  Filled 2011-11-12 (×7): qty 1

## 2011-11-12 MED ORDER — POTASSIUM CHLORIDE IN NACL 20-0.45 MEQ/L-% IV SOLN
INTRAVENOUS | Status: AC
  Administered 2011-11-12 – 2011-11-13 (×2): via INTRAVENOUS
  Filled 2011-11-12 (×4): qty 1000

## 2011-11-12 MED ORDER — DEXTROSE 5 % IV SOLN
1.0000 g | Freq: Once | INTRAVENOUS | Status: AC
  Administered 2011-11-12: 1 g via INTRAVENOUS
  Filled 2011-11-12: qty 10

## 2011-11-12 MED ORDER — SENNA 8.6 MG PO TABS
1.0000 | ORAL_TABLET | Freq: Two times a day (BID) | ORAL | Status: DC
  Administered 2011-11-12 – 2011-11-18 (×10): 8.6 mg via ORAL
  Filled 2011-11-12 (×8): qty 1

## 2011-11-12 MED ORDER — LORATADINE 10 MG PO TABS
10.0000 mg | ORAL_TABLET | Freq: Every day | ORAL | Status: DC
  Administered 2011-11-12 – 2011-11-18 (×5): 10 mg via ORAL
  Filled 2011-11-12 (×8): qty 1

## 2011-11-12 MED ORDER — IPRATROPIUM-ALBUTEROL 18-103 MCG/ACT IN AERO
1.0000 | INHALATION_SPRAY | RESPIRATORY_TRACT | Status: DC | PRN
  Filled 2011-11-12: qty 14.7

## 2011-11-12 MED ORDER — ETOMIDATE 2 MG/ML IV SOLN
INTRAVENOUS | Status: DC | PRN
  Administered 2011-11-12: 12 mg via INTRAVENOUS

## 2011-11-12 MED ORDER — PANTOPRAZOLE SODIUM 40 MG PO TBEC
40.0000 mg | DELAYED_RELEASE_TABLET | Freq: Every day | ORAL | Status: DC
  Administered 2011-11-12 – 2011-11-18 (×5): 40 mg via ORAL
  Filled 2011-11-12 (×7): qty 1

## 2011-11-12 MED ORDER — LATANOPROST 0.005 % OP SOLN
1.0000 [drp] | Freq: Every day | OPHTHALMIC | Status: DC
  Administered 2011-11-12 – 2011-11-17 (×6): 1 [drp] via OPHTHALMIC
  Filled 2011-11-12: qty 2.5

## 2011-11-12 MED ORDER — ASPIRIN EC 81 MG PO TBEC
81.0000 mg | DELAYED_RELEASE_TABLET | Freq: Every day | ORAL | Status: DC
  Administered 2011-11-12 – 2011-11-18 (×5): 81 mg via ORAL
  Filled 2011-11-12 (×7): qty 1

## 2011-11-12 MED ORDER — DICYCLOMINE HCL 10 MG PO CAPS
10.0000 mg | ORAL_CAPSULE | Freq: Four times a day (QID) | ORAL | Status: DC | PRN
  Filled 2011-11-12: qty 1

## 2011-11-12 MED ORDER — METOCLOPRAMIDE HCL 5 MG/ML IJ SOLN
10.0000 mg | Freq: Once | INTRAMUSCULAR | Status: AC
  Administered 2011-11-12: 10 mg via INTRAVENOUS
  Filled 2011-11-12: qty 2

## 2011-11-12 MED ORDER — GUAIFENESIN ER 600 MG PO TB12
1200.0000 mg | ORAL_TABLET | Freq: Two times a day (BID) | ORAL | Status: DC
  Administered 2011-11-12 – 2011-11-18 (×12): 1200 mg via ORAL
  Filled 2011-11-12 (×15): qty 2

## 2011-11-12 MED ORDER — DEXTROSE 5 % IV SOLN
500.0000 mg | Freq: Once | INTRAVENOUS | Status: AC
  Administered 2011-11-12: 500 mg via INTRAVENOUS
  Filled 2011-11-12: qty 500

## 2011-11-12 MED ORDER — DEXTROSE 5 % IV SOLN
500.0000 mg | INTRAVENOUS | Status: AC
  Administered 2011-11-13 – 2011-11-14 (×2): 500 mg via INTRAVENOUS
  Filled 2011-11-12 (×3): qty 500

## 2011-11-12 MED ORDER — ETOMIDATE 2 MG/ML IV SOLN
20.0000 mg | Freq: Once | INTRAVENOUS | Status: DC
  Filled 2011-11-12: qty 10

## 2011-11-12 MED ORDER — ONDANSETRON HCL 4 MG/2ML IJ SOLN
4.0000 mg | Freq: Four times a day (QID) | INTRAMUSCULAR | Status: DC | PRN
  Administered 2011-11-12 (×2): 4 mg via INTRAVENOUS
  Filled 2011-11-12 (×2): qty 2

## 2011-11-12 MED ORDER — FLUTICASONE PROPIONATE 50 MCG/ACT NA SUSP
1.0000 | Freq: Two times a day (BID) | NASAL | Status: DC | PRN
  Filled 2011-11-12: qty 16

## 2011-11-12 MED ORDER — POTASSIUM CHLORIDE CRYS ER 20 MEQ PO TBCR
40.0000 meq | EXTENDED_RELEASE_TABLET | Freq: Once | ORAL | Status: AC
  Administered 2011-11-12: 40 meq via ORAL
  Filled 2011-11-12: qty 2

## 2011-11-12 MED ORDER — MONTELUKAST SODIUM 10 MG PO TABS
10.0000 mg | ORAL_TABLET | Freq: Every day | ORAL | Status: DC
  Administered 2011-11-12 – 2011-11-17 (×6): 10 mg via ORAL
  Filled 2011-11-12 (×7): qty 1

## 2011-11-12 NOTE — H&P (Signed)
PCP:   Marga Melnick, MD, MD   Chief Complaint:  Passed out at home this morning.  HPI: Valerie Rollins is a pleasant 75 year old female who came in after she passed out at home. She remembers that she went to the bathroom and fell on her way back to the bedroom. Her husband found her on the floor about 40 minutes later, after she shouted for help. Patient does not recall how she ended up on the floor. Prior to today, she had had a cough productive of greenish colored phlegm for a week, followed by left sided chest discomfort, no hemoptysis. She also had diarrhea yesterday, no vomiting no dysuria. Her appetite has been poor for the last week. Patient was found to have Left mid lobe PNA, and to have left ankle fracture/dislocation. Orthopedics input appreciated. CT brain unremarkable, EKG RBBB. Patient has already been given ceftriaxone/zithromax/kcl, and referred to hospitalist service for admission.  Review of Systems:  Negative except as highlighted in HPI.  Past Medical History: Past Medical History  Diagnosis Date  . Diverticulitis 2002    based on CT scan  . Hyperlipidemia   . Hypertension   . Superficial phlebitis     x2, 1999, 2000  . Ocular hypertension     glaucoma suspect, Dr. Dagoberto Ligas  . Skin cancer   . Endometriosis   . Abnormal menses   . Gastritis 2006    @ Endo  . Hiatal hernia   . Diverticulosis   . GERD (gastroesophageal reflux disease)   . DVT (deep venous thrombosis)    Past Surgical History  Procedure Date  . Total abdominal hysterectomy w/ bilateral salpingoophorectomy 1980    dysfunctional menses, endometriosis  . Varicose vein surgery     1960, 1965, 2009  . Tonsillectomy   . Cataract extraction w/ intraocular lens implant     OS  . Colonoscopy 1995, 2002    diverticulosis    Medications: Prior to Admission medications   Medication Sig Start Date End Date Taking? Authorizing Provider  albuterol-ipratropium (COMBIVENT) 18-103 MCG/ACT inhaler Inhale  1 puff into the lungs as directed. 1-2 puffs every 4-6hrs prn cough/SOB 05/05/11  Yes Pecola Lawless, MD  carvedilol (COREG) 12.5 MG tablet TAKE ONE TABLET BY MOUTH TWICE DAILY 07/14/11  Yes Pecola Lawless, MD  colestipol (COLESTID) 1 G tablet Take 1 tablet (1 g total) by mouth 2 (two) times daily. 05/28/11 05/27/12 Yes Yancey Flemings, MD  dicyclomine (BENTYL) 10 MG capsule Take 10 mg by mouth every 6 (six) hours as needed.     Yes Historical Provider, MD  fexofenadine (ALLEGRA) 180 MG tablet Take 180 mg by mouth as needed.     Yes Historical Provider, MD  fluticasone (FLONASE) 50 MCG/ACT nasal spray Place 1 spray into the nose 2 (two) times daily as needed.    Yes Historical Provider, MD  latanoprost (XALATAN) 0.005 % ophthalmic solution Place 1 drop into both eyes daily.     Yes Historical Provider, MD  losartan (COZAAR) 100 MG tablet Take 100 mg by mouth daily.     Yes Historical Provider, MD  montelukast (SINGULAIR) 10 MG tablet Take 1 tablet (10 mg total) by mouth at bedtime. 08/23/11  Yes Pecola Lawless, MD  omeprazole (PRILOSEC) 20 MG capsule Take 1 capsule (20 mg total) by mouth daily. 08/23/11  Yes Pecola Lawless, MD  furosemide (LASIX) 20 MG tablet Take 20 mg by mouth 2 (two) times daily.     Historical  Provider, MD    Allergies:   Allergies  Allergen Reactions  . Oxybutynin Chloride     Tongue swelling; Rx from Dr Dannette Barbara  . Propoxyphene N-Acetaminophen     REACTION: made pt blood to thin  . Alendronate Sodium   . Ramipril     REACTION: cough    Social History:  reports that she has never smoked. She has never used smokeless tobacco. She reports that she does not drink alcohol or use illicit drugs.  Family History: Family History  Problem Relation Age of Onset  . Heart attack Father 66  . Heart disease Father 51    MI   . Alzheimer's disease Mother     TIAs  . Mental illness Mother     ALSHEIMERS  . Hyperlipidemia Brother   . Hypertension Brother   . Diverticulitis  Daughter     S/P colectomy  . Colon cancer Maternal Grandmother   . Cancer Maternal Grandmother     COLON  . Heart attack Paternal Grandmother 9    Physical Exam: Filed Vitals:   11/12/11 0650 11/12/11 0801 11/12/11 0826 11/12/11 0839  BP: 124/60 141/63 128/57 134/73  Pulse: 56 58 57 65  Temp: 99.3 F (37.4 C)     TempSrc: Oral     Resp: 20 20 16 19   Weight:      SpO2: 97% 99% 96% 98%   General appearance: alert and cooperative Eyes: conjunctivae/corneas clear. PERRL, EOM's intact. Fundi benign. Throat: lips, mucosa, and tongue normal; teeth and gums normal Resp: left sided coarse rhonchi. Cardio: regular rate and rhythm, S1, S2 normal, no murmur, click, rub or gallop GI: soft, non-tender; bowel sounds normal; no masses,  no organomegaly Extremities: extremities normal, atraumatic, no cyanosis or edema Pulses: 2+ and symmetric Neurologic: Grossly normal   Labs on Admission:   [facility] 11/12/11 0820  NA 136  K 2.9*  CL 99  CO2 26  GLUCOSE 127*  BUN 12  CREATININE 0.73  CALCIUM 9.1  MG --  PHOS --    Basename 11/12/11 0820  AST 20  ALT 11  ALKPHOS 74  BILITOT 0.7  PROT 6.8  ALBUMIN 3.5   No results found for this basename: LIPASE:2,AMYLASE:2 in the last 72 hours  Basename 11/12/11 0820  WBC 12.4*  NEUTROABS 10.5*  HGB 12.5  HCT 36.2  MCV 84.2  PLT 188    Basename 11/12/11 0820  CKTOTAL 41  CKMB 1.8  CKMBINDEX --  TROPONINI <0.30   No results found for this basename: TSH,T4TOTAL,FREET3,T3FREE,THYROIDAB in the last 72 hours No results found for this basename: VITAMINB12:2,FOLATE:2,FERRITIN:2,TIBC:2,IRON:2,RETICCTPCT:2 in the last 72 hours  Radiological Exams on Admission: Dg Ankle 2 Views Left  11/12/2011  *RADIOLOGY REPORT*  Clinical Data: Fall with pain and deformity  LEFT ANKLE - 2 VIEW  Comparison: None.  Findings: There is fracture dislocation of the ankle joint.  There is a transverse fracture of the medial malleolus.  There is an  oblique fracture of the distal fibula.  Talar dome is dislocated laterally and posterior relative to the tibial articular surface. There is medial and the anterior angulation of the fibular fracture.  No fracture of the talus or bones of the foot identified.  IMPRESSION: Fracture dislocation of the ankle joint as described.  Original Report Authenticated By: Thomasenia Sales, M.D.   Dg Ankle Complete Left  11/12/2011  *RADIOLOGY REPORT*  Clinical Data: Post reduction  LEFT ANKLE COMPLETE - 3+ VIEW  Comparison: 11/12/2011  Findings:  Fracture dislocation of the ankle with partial reduction. Alignment is significantly improved.  There is a fracture of the medial malleolus and distal fibula.  There remains widening of  the ankle joint anteriorly and medially.  Plaster has been placed obscuring bony detail.  IMPRESSION: Partial reduction of fracture dislocation of the ankle.  Original Report Authenticated By: Camelia Phenes, M.D.   Ct Head Wo Contrast  11/12/2011  *RADIOLOGY REPORT*  Clinical Data:  75 year old female with syncope.  Fall at 4:00 a.m. today.  CT HEAD WITHOUT CONTRAST CT CERVICAL SPINE WITHOUT CONTRAST  Technique:  Multidetector CT imaging of the head and cervical spine was performed following the standard protocol without intravenous contrast.  Multiplanar CT image reconstructions of the cervical spine were also generated.  Comparison:  None.  CT HEAD  Findings: Scattered paranasal sinus mucosal thickening.  No sinus fluid levels.  Mastoids are clear. No acute osseous abnormality identified.  Postoperative changes to the left globe.  Otherwise negative orbit and scalp soft tissues.  Cerebral volume is within normal limits for age.  No midline shift, ventriculomegaly, mass effect, evidence of mass lesion, intracranial hemorrhage or evidence of cortically based acute infarction.  Gray-white matter differentiation is within normal limits throughout the brain.  No suspicious intracranial vascular  hyperdensity. Calcified atherosclerosis at the skull base.  IMPRESSION:  Normal noncontrast CT appearance of the brain for age.  CT CERVICAL SPINE  Findings: Visualized skull base is intact.  No atlanto-occipital dissociation.  Straightening of cervical lordosis. Cervicothoracic junction alignment is within normal limits.  Bilateral posterior element alignment is within normal limits.  Multilevel advanced facet degeneration.  Less pronounced disc degeneration.  No acute cervical fracture. Visualized paraspinal soft tissues are within normal limits.  IMPRESSION: No acute fracture or listhesis identified in the cervical spine. Ligamentous injury is not excluded.  Advanced facet degeneration.  Original Report Authenticated By: Harley Hallmark, M.D.   Ct Cervical Spine Wo Contrast  11/12/2011  *RADIOLOGY REPORT*  Clinical Data:  75 year old female with syncope.  Fall at 4:00 a.m. today.  CT HEAD WITHOUT CONTRAST CT CERVICAL SPINE WITHOUT CONTRAST  Technique:  Multidetector CT imaging of the head and cervical spine was performed following the standard protocol without intravenous contrast.  Multiplanar CT image reconstructions of the cervical spine were also generated.  Comparison:  None.  CT HEAD  Findings: Scattered paranasal sinus mucosal thickening.  No sinus fluid levels.  Mastoids are clear. No acute osseous abnormality identified.  Postoperative changes to the left globe.  Otherwise negative orbit and scalp soft tissues.  Cerebral volume is within normal limits for age.  No midline shift, ventriculomegaly, mass effect, evidence of mass lesion, intracranial hemorrhage or evidence of cortically based acute infarction.  Gray-white matter differentiation is within normal limits throughout the brain.  No suspicious intracranial vascular hyperdensity. Calcified atherosclerosis at the skull base.  IMPRESSION:  Normal noncontrast CT appearance of the brain for age.  CT CERVICAL SPINE  Findings: Visualized skull base is  intact.  No atlanto-occipital dissociation.  Straightening of cervical lordosis. Cervicothoracic junction alignment is within normal limits.  Bilateral posterior element alignment is within normal limits.  Multilevel advanced facet degeneration.  Less pronounced disc degeneration.  No acute cervical fracture. Visualized paraspinal soft tissues are within normal limits.  IMPRESSION: No acute fracture or listhesis identified in the cervical spine. Ligamentous injury is not excluded.  Advanced facet degeneration.  Original Report Authenticated By: Harley Hallmark, M.D.   Dg Chest Port 1  View  11/12/2011  *RADIOLOGY REPORT*  Clinical Data: Patient fell today, cough, congestion  PORTABLE CHEST - 1 VIEW  Comparison: Chest x-ray of 06/25/2008  Findings: There is vague opacity in the left mid lung field. This may represent pneumonia, but a neoplasm cannot be excluded and follow-up chest x-ray is recommended to ensure clearing.  Otherwise the lungs are clear.  Mediastinal contours appear stable.  There is mild cardiomegaly present.  There are degenerative changes throughout the thoracic spine.  IMPRESSION:  1.  Opacity in the left midlung may represent pneumonia, but recommend follow-up chest x-ray to exclude neoplasm. 2.  Stable cardiomegaly.  Original Report Authenticated By: Juline Patch, M.D.   Dg Knee Complete 4 Views Left  11/12/2011  *RADIOLOGY REPORT*  Clinical Data: Fall  LEFT KNEE - COMPLETE 4+ VIEW  Comparison: None.  Findings: Normal alignment.  No fracture.  Mild degenerative change in the knee joint with spurring of the medial tibial spine and mild spurring of the patella.  No joint effusion.  IMPRESSION: Negative for fracture.  Original Report Authenticated By: Camelia Phenes, M.D.    Assessment  Left sided community acquired pneumonia triggering poor appetite/dehydration /diarrhea, hence hypokalemia. This is likely the cause of syncopal episode. Likely her BP was low. However, need to exclude  other possibilities. Has ankle fracture being evaluated by Dr Berton Lan.  Plan  .Pneumonia- admit for iv abx/ivf. Follow septic w/u. Marland KitchenSyncope- cardiac enzymes/tsh,telemetry monitoring for arrhythmias/2decho/carotid duplex. Consider mri/eeg if concern despite screening tests. .Hypokalemia- likely due to gi loss. Replenish. Check magnesium. .RBBB (right bundle branch block)- tele/cardiac enzymes, likely old. Marland KitchenHTN (hypertension), malignant- resume home meds. Marland KitchenGERD (gastroesophageal reflux disease)- on ppi. Marland KitchenAnkle fracture, left- defer mx to Ortho. Condition guarded. Discussed plan of care with husband.  Valerie Rollins 11/12/2011, 11:45 AM

## 2011-11-12 NOTE — Progress Notes (Addendum)
Orthopedic Tech Progress Note Patient Details:  Valerie Rollins [birth year] [id number]                     Patient ID: Valerie Rollins, female   DOB: 08-15-1936, 75 y.o.   MRN: [id number]   Valerie Rollins 11/12/2011, 9:10 AM Short leg posterior splint ordered prior to reduction of ankle.  Patient to have post reduction xrays taken then additional stirrup splint to be applied.  Posterior splint was applied after initial reduction.  Awaiting results prior to adding stirrup for stability.

## 2011-11-12 NOTE — ED Notes (Signed)
Left ankle propped with pillow, ice bag placed on ankle.

## 2011-11-12 NOTE — ED Notes (Signed)
Pt reports waking up at 0400 this am to get some water and was awakened by her husband approx 0445. Pt does not remember falling, hitting her head, or how long she was unconscious. Pt has obvious deformity to L ankle. Pt reports feeling sick w/nasal and chest congestion x1 wk, was scheduled to see her pcp today

## 2011-11-12 NOTE — Consult Note (Signed)
Reason for Consult: Ankle pain following syncopal episoode.  Referring Physician: Hospitalist Service  Consulting Physician:  Dr. Ollen Gross  Valerie Rollins is an 75 y.o. female.  HPI: Patient is a 75 year old female seen in [facility] ED for ankle injury sustained during a fall around 4 am earlier this morning.  She was at home and had gotten up to go to the bathroom when she blacked out and was found on the floor.  She was brought in for evaluation and is being admitted to the medicine service. She was found to have an obvious deformity to her left ankle.  X-rays showed that she had sustained a displaced bimalleolar ankle fracture.  It has been splinted and obtained a fair reduction compared to the initial films on admission to the ED.  She is in a posterior splint at this time.  She is accompanied by her family in the ED. Her history is significant for recent illness spanning the past two weeks.  She was seen approximately two weeks ago at [facility] for symptoms similar to a GI virus.  The staff at Urgent Care thought she made have consumed something that cause GI upset.  Shortly after this visit, she found bright red blood per rectum and was seen by Dr. Alwyn Ren.  She had just been diagnosed with colonic diverticular disease by colonscope earlier this year in August and she thinks it may have been a diverticular flare.  Over the past week or so since then, she has had continued symptoms including diarrhea, and low grade fever.  It initially started out with sneezing and a bad cough.  She has had very poor appetite over this time frame also. She will be admitted by Medicine and worked up. I have explained to the patient and family about the fracture and that it very well may require surgical intervention but does not have to be rushed off emergently and that it could wait until she has been evaluated and cleared following her syncopal episode.  She and her family understand.  They know Dr. Lequita Halt will be  over later today to follow up with her and finalized treatment plan based on her medical condition and workup.  Past Medical History  Diagnosis Date  . Diverticulitis 2002    based on CT scan  . Hyperlipidemia   . Hypertension   . Superficial phlebitis     x2, 1999, 2000  . Ocular hypertension     glaucoma suspect, Dr. Dagoberto Ligas  . Skin cancer   . Endometriosis   . Abnormal menses   . Gastritis 2006    @ Endo  . Hiatal hernia   . Diverticulosis   . GERD (gastroesophageal reflux disease)   . DVT (deep venous thrombosis)     Past Surgical History  Procedure Date  . Total abdominal hysterectomy w/ bilateral salpingoophorectomy 1980    dysfunctional menses, endometriosis  . Varicose vein surgery     1960, 1965, 2009  . Tonsillectomy   . Cataract extraction w/ intraocular lens implant     OS  . Colonoscopy 1995, 2002    diverticulosis    Family History  Problem Relation Age of Onset  . Heart attack Father 21  . Heart disease Father 40    MI   . Alzheimer's disease Mother     TIAs  . Mental illness Mother     ALSHEIMERS  . Hyperlipidemia Brother   . Hypertension Brother   . Diverticulitis Daughter  S/P colectomy  . Colon cancer Maternal Grandmother   . Cancer Maternal Grandmother     COLON  . Heart attack Paternal Grandmother 7    Social History:  reports that she has never smoked. She has never used smokeless tobacco. She reports that she does not drink alcohol or use illicit drugs.  Allergies:  Allergies  Allergen Reactions  . Oxybutynin Chloride     Tongue swelling; Rx from Dr Dannette Barbara  . Propoxyphene N-Acetaminophen     REACTION: made pt blood to thin  . Alendronate Sodium   . Ramipril     REACTION: cough   Medications:  Combivent, coreg, colestid, bentyl, allegra, flonase, xalatan, cozaar, singulair, prilosec, and lasix  Results for orders placed during the hospital encounter of 11/12/11 (from the past 48 hour(s))  CBC     Status: Abnormal    Collection Time   11/12/11  8:20 AM      Component Value Range Comment   WBC 12.4 (*) 4.0 - 10.5 (K/uL)    RBC 4.30  3.87 - 5.11 (MIL/uL)    Hemoglobin 12.5  12.0 - 15.0 (g/dL)    HCT 16.1  09.6 - 04.5 (%)    MCV 84.2  78.0 - 100.0 (fL)    MCH 29.1  26.0 - 34.0 (pg)    MCHC 34.5  30.0 - 36.0 (g/dL)    RDW 40.9  81.1 - 91.4 (%)    Platelets 188  150 - 400 (K/uL)   DIFFERENTIAL     Status: Abnormal   Collection Time   11/12/11  8:20 AM      Component Value Range Comment   Neutrophils Relative 84 (*) 43 - 77 (%)    Neutro Abs 10.5 (*) 1.7 - 7.7 (K/uL)    Lymphocytes Relative 9 (*) 12 - 46 (%)    Lymphs Abs 1.1  0.7 - 4.0 (K/uL)    Monocytes Relative 7  3 - 12 (%)    Monocytes Absolute 0.8  0.1 - 1.0 (K/uL)    Eosinophils Relative 0  0 - 5 (%)    Eosinophils Absolute 0.0  0.0 - 0.7 (K/uL)    Basophils Relative 0  0 - 1 (%)    Basophils Absolute 0.0  0.0 - 0.1 (K/uL)   COMPREHENSIVE METABOLIC PANEL     Status: Abnormal   Collection Time   11/12/11  8:20 AM      Component Value Range Comment   Sodium 136  135 - 145 (mEq/L)    Potassium 2.9 (*) 3.5 - 5.1 (mEq/L)    Chloride 99  96 - 112 (mEq/L)    CO2 26  19 - 32 (mEq/L)    Glucose, Bld 127 (*) 70 - 99 (mg/dL)    BUN 12  6 - 23 (mg/dL)    Creatinine, Ser 7.82  0.50 - 1.10 (mg/dL)    Calcium 9.1  8.4 - 10.5 (mg/dL)    Total Protein 6.8  6.0 - 8.3 (g/dL)    Albumin 3.5  3.5 - 5.2 (g/dL)    AST 20  0 - 37 (U/L)    ALT 11  0 - 35 (U/L)    Alkaline Phosphatase 74  39 - 117 (U/L)    Total Bilirubin 0.7  0.3 - 1.2 (mg/dL)    GFR calc non Af Amer 83 (*) >90 (mL/min)    GFR calc Af Amer >90  >90 (mL/min)   TROPONIN I     Status: Normal  Collection Time   11/12/11  8:20 AM      Component Value Range Comment   Troponin I <0.30  <0.30 (ng/mL)   CK TOTAL AND CKMB     Status: Normal   Collection Time   11/12/11  8:20 AM      Component Value Range Comment   Total CK 41  7 - 177 (U/L)    CK, MB 1.8  0.3 - 4.0 (ng/mL)    Relative  Index RELATIVE INDEX IS INVALID  0.0 - 2.5    PROTIME-INR     Status: Normal   Collection Time   11/12/11  8:20 AM      Component Value Range Comment   Prothrombin Time 14.4  11.6 - 15.2 (seconds)    INR 1.10  0.00 - 1.49    APTT     Status: Normal   Collection Time   11/12/11  8:20 AM      Component Value Range Comment   aPTT 30  24 - 37 (seconds)     Dg Ankle 2 Views Left  11/12/2011  *RADIOLOGY REPORT*  Clinical Data: Fall with pain and deformity  LEFT ANKLE - 2 VIEW  Comparison: None.  Findings: There is fracture dislocation of the ankle joint.  There is a transverse fracture of the medial malleolus.  There is an oblique fracture of the distal fibula.  Talar dome is dislocated laterally and posterior relative to the tibial articular surface. There is medial and the anterior angulation of the fibular fracture.  No fracture of the talus or bones of the foot identified.  IMPRESSION: Fracture dislocation of the ankle joint as described.  Original Report Authenticated By: Thomasenia Sales, M.D.   Dg Ankle Complete Left  11/12/2011  *RADIOLOGY REPORT*  Clinical Data: Post reduction  LEFT ANKLE COMPLETE - 3+ VIEW  Comparison: 11/12/2011  Findings: Fracture dislocation of the ankle with partial reduction. Alignment is significantly improved.  There is a fracture of the medial malleolus and distal fibula.  There remains widening of  the ankle joint anteriorly and medially.  Plaster has been placed obscuring bony detail.  IMPRESSION: Partial reduction of fracture dislocation of the ankle.  Original Report Authenticated By: Camelia Phenes, M.D.   Ct Head Wo Contrast  11/12/2011  *RADIOLOGY REPORT*  Clinical Data:  75 year old female with syncope.  Fall at 4:00 a.m. today.  CT HEAD WITHOUT CONTRAST CT CERVICAL SPINE WITHOUT CONTRAST  Technique:  Multidetector CT imaging of the head and cervical spine was performed following the standard protocol without intravenous contrast.  Multiplanar CT image  reconstructions of the cervical spine were also generated.  Comparison:  None.  CT HEAD  Findings: Scattered paranasal sinus mucosal thickening.  No sinus fluid levels.  Mastoids are clear. No acute osseous abnormality identified.  Postoperative changes to the left globe.  Otherwise negative orbit and scalp soft tissues.  Cerebral volume is within normal limits for age.  No midline shift, ventriculomegaly, mass effect, evidence of mass lesion, intracranial hemorrhage or evidence of cortically based acute infarction.  Gray-white matter differentiation is within normal limits throughout the brain.  No suspicious intracranial vascular hyperdensity. Calcified atherosclerosis at the skull base.  IMPRESSION:  Normal noncontrast CT appearance of the brain for age.  CT CERVICAL SPINE  Findings: Visualized skull base is intact.  No atlanto-occipital dissociation.  Straightening of cervical lordosis. Cervicothoracic junction alignment is within normal limits.  Bilateral posterior element alignment is within normal limits.  Multilevel advanced  facet degeneration.  Less pronounced disc degeneration.  No acute cervical fracture. Visualized paraspinal soft tissues are within normal limits.  IMPRESSION: No acute fracture or listhesis identified in the cervical spine. Ligamentous injury is not excluded.  Advanced facet degeneration.  Original Report Authenticated By: Harley Hallmark, M.D.   Ct Cervical Spine Wo Contrast  11/12/2011  *RADIOLOGY REPORT*  Clinical Data:  75 year old female with syncope.  Fall at 4:00 a.m. today.  CT HEAD WITHOUT CONTRAST CT CERVICAL SPINE WITHOUT CONTRAST  Technique:  Multidetector CT imaging of the head and cervical spine was performed following the standard protocol without intravenous contrast.  Multiplanar CT image reconstructions of the cervical spine were also generated.  Comparison:  None.  CT HEAD  Findings: Scattered paranasal sinus mucosal thickening.  No sinus fluid levels.  Mastoids are  clear. No acute osseous abnormality identified.  Postoperative changes to the left globe.  Otherwise negative orbit and scalp soft tissues.  Cerebral volume is within normal limits for age.  No midline shift, ventriculomegaly, mass effect, evidence of mass lesion, intracranial hemorrhage or evidence of cortically based acute infarction.  Gray-white matter differentiation is within normal limits throughout the brain.  No suspicious intracranial vascular hyperdensity. Calcified atherosclerosis at the skull base.  IMPRESSION:  Normal noncontrast CT appearance of the brain for age.  CT CERVICAL SPINE  Findings: Visualized skull base is intact.  No atlanto-occipital dissociation.  Straightening of cervical lordosis. Cervicothoracic junction alignment is within normal limits.  Bilateral posterior element alignment is within normal limits.  Multilevel advanced facet degeneration.  Less pronounced disc degeneration.  No acute cervical fracture. Visualized paraspinal soft tissues are within normal limits.  IMPRESSION: No acute fracture or listhesis identified in the cervical spine. Ligamentous injury is not excluded.  Advanced facet degeneration.  Original Report Authenticated By: Harley Hallmark, M.D.   Dg Chest Port 1 View  11/12/2011  *RADIOLOGY REPORT*  Clinical Data: Patient fell today, cough, congestion  PORTABLE CHEST - 1 VIEW  Comparison: Chest x-ray of 06/25/2008  Findings: There is vague opacity in the left mid lung field. This may represent pneumonia, but a neoplasm cannot be excluded and follow-up chest x-ray is recommended to ensure clearing.  Otherwise the lungs are clear.  Mediastinal contours appear stable.  There is mild cardiomegaly present.  There are degenerative changes throughout the thoracic spine.  IMPRESSION:  1.  Opacity in the left midlung may represent pneumonia, but recommend follow-up chest x-ray to exclude neoplasm. 2.  Stable cardiomegaly.  Original Report Authenticated By: Juline Patch,  M.D.   Dg Knee Complete 4 Views Left  11/12/2011  *RADIOLOGY REPORT*  Clinical Data: Fall  LEFT KNEE - COMPLETE 4+ VIEW  Comparison: None.  Findings: Normal alignment.  No fracture.  Mild degenerative change in the knee joint with spurring of the medial tibial spine and mild spurring of the patella.  No joint effusion.  IMPRESSION: Negative for fracture.  Original Report Authenticated By: Camelia Phenes, M.D.    Review of Systems  Constitutional: Positive for fever and malaise/fatigue.  Gastrointestinal: Positive for nausea, abdominal pain and blood in stool.  Neurological: Positive for loss of consciousness and weakness.   Blood pressure 134/73, pulse 65, temperature 99.3 F (37.4 C), temperature source Oral, resp. rate 19, weight 79.379 kg (175 lb), SpO2 98.00%. Physical Exam  Constitutional: She appears well-developed and well-nourished. She is cooperative.  HENT:  Head: Normocephalic and atraumatic.  Eyes: Pupils are equal, round, and reactive to light.  Neck: Normal range of motion. Neck supple.  GI: Soft.  Musculoskeletal:       Left ankle: tenderness. Lateral malleolus and medial malleolus tenderness found.       Legs:      Feet:  Neurological: She is alert.  Skin:       Assessment/Plan: Left Ankle Bimalleolar Fracture and Dislocation  Patient states she is fairly comfortable at this time. Family in room.  Continue bedrest with foot elevated on pillows.  Dr. Lequita Halt to evaluate patient later today.  I have explained to the patient and family about the fracture and that it very well may require surgical intervention but does not have to be rushed off emergently and that it could wait until she has been evaluated and cleared following her syncopal episode.  She and her family understand.   Patrica Duel 11/12/2011, 11:20 AM   I have seen and examined this patient and agree with above. She has a bimalleolar ankle fx/dislocation which has been reduced in the ED by  Dr. Rosalia Hammers and splinted. Reduction is acceptable but patient will require an ORIF given the nature of this injury. Discussed in detail with patient and family. She needs to be cleared medically prior to proceeding with surgery. I have contacted my partner on call this weekend and she will be treated surgically once cleared.  Gus Rankin Aamori Mcmasters, MD    11/12/2011, 4:47 PM

## 2011-11-12 NOTE — ED Notes (Signed)
Per [facility] pt from home, pt reports falling approx 0400 this am, unsure of mechanism of fall, obvious deformity to L ankle, good PMS, ankle splinted PTA, 18 g RAC, Fentanyl 150 mcg given en route

## 2011-11-12 NOTE — ED Notes (Signed)
Attempted to give report to floor.  Nurse requests 5 minutes and will return call.

## 2011-11-12 NOTE — ED Notes (Signed)
Have attempted to give report to Shanda Bumps, RN for the 2nd time and she remains unavailable. Have asked for the charge nurse and was placed on hold for more than 5 min.  Will inform my charge nurse.

## 2011-11-12 NOTE — Progress Notes (Signed)
Orthopedic Tech Progress Note Patient Details:  Valerie Rollins 1936-11-04 [id number]                     Patient ID: Valerie Rollins, female   DOB: September 20, 1936, 75 y.o.   MRN: [id number]   Valerie Rollins 11/12/2011, 9:58 AM Stirrup not to be applied, pending review of reduction films by Orthopedic Dr. Posterior splint in place at the time of the post reduction films.

## 2011-11-12 NOTE — ED Notes (Signed)
X-ray at bedside

## 2011-11-12 NOTE — ED Notes (Signed)
[id number]<AV> Expected date:11/12/11<BR> Expected time: 6:24 AM<BR> Means of arrival:Ambulance<BR> Comments:<BR> Fall, obvious deformity

## 2011-11-12 NOTE — ED Notes (Signed)
Returned from radiology. 

## 2011-11-12 NOTE — ED Provider Notes (Signed)
History     CSN: [id number]  Arrival date & time 11/12/11  7829   First MD Initiated Contact with Patient 11/12/11 0703      Chief Complaint  Patient presents with  . Ankle Pain    (Consider location/radiation/quality/duration/timing/severity/associated sxs/prior treatment) HPI Patient with uri symptoms for a week.  Patient had syncopal episode this a.m. And awoke on floor with pain in left ankle and left ankle deformity.  Patient has had temperature to 101.4 and started with sneezing f/b cough productive of small amount of green sputum.  No dyspnea pain under left breastbegan yesterday, constant, increases with laying on left side.  Decreased po intake.   Past Medical History  Diagnosis Date  . Diverticulitis 2002    based on CT scan  . DVT (deep vein thrombosis) in pregnancy 2000  . Hyperlipidemia   . Hypertension   . Superficial phlebitis     x2, 1999, 2000  . Ocular hypertension     glaucoma suspect, Dr. Dagoberto Ligas  . Skin cancer   . Endometriosis   . Abnormal menses   . Gastritis 2006    @ Endo  . Hiatal hernia   . Diverticulosis   . GERD (gastroesophageal reflux disease)     Past Surgical History  Procedure Date  . Total abdominal hysterectomy w/ bilateral salpingoophorectomy 1980    dysfunctional menses, endometriosis  . Varicose vein surgery     1960, 1965, 2009  . Tonsillectomy   . Cataract extraction w/ intraocular lens implant     OS  . Colonoscopy 1995, 2002    diverticulosis    Family History  Problem Relation Age of Onset  . Heart attack Father 55  . Heart disease Father 21    MI   . Alzheimer's disease Mother     TIAs  . Mental illness Mother     ALSHEIMERS  . Hyperlipidemia Brother   . Hypertension Brother   . Diverticulitis Daughter     S/P colectomy  . Colon cancer Maternal Grandmother   . Cancer Maternal Grandmother     COLON  . Heart attack Paternal Grandmother 70    History  Substance Use Topics  . Smoking status: Never  Smoker   . Smokeless tobacco: Never Used  . Alcohol Use: No    OB History    Grav Para Term Preterm Abortions TAB SAB Ect Mult Living                  Review of Systems  All other systems reviewed and are negative.    Allergies  Oxybutynin chloride; Propoxyphene n-acetaminophen; Alendronate sodium; and Ramipril  Home Medications   Current Outpatient Rx  Name Route Sig Dispense Refill  . IPRATROPIUM-ALBUTEROL 18-103 MCG/ACT IN AERO Inhalation Inhale 1 puff into the lungs as directed. 1-2 puffs every 4-6hrs prn cough/SOB 14.7 g 11  . CARVEDILOL 12.5 MG PO TABS  TAKE ONE TABLET BY MOUTH TWICE DAILY 60 tablet 5  . COLESTIPOL HCL 1 G PO TABS Oral Take 1 tablet (1 g total) by mouth 2 (two) times daily. 60 tablet 3  . DICYCLOMINE HCL 10 MG PO CAPS Oral Take 10 mg by mouth every 6 (six) hours as needed.      Marland Kitchen FEXOFENADINE HCL 180 MG PO TABS Oral Take 180 mg by mouth as needed.      Marland Kitchen FLUTICASONE PROPIONATE 50 MCG/ACT NA SUSP Nasal Place 1 spray into the nose 2 (two) times daily as needed.     Marland Kitchen  LATANOPROST 0.005 % OP SOLN Both Eyes Place 1 drop into both eyes daily.      Marland Kitchen LOSARTAN POTASSIUM 100 MG PO TABS Oral Take 100 mg by mouth daily.      Marland Kitchen MONTELUKAST SODIUM 10 MG PO TABS Oral Take 1 tablet (10 mg total) by mouth at bedtime. 90 tablet 1  . OMEPRAZOLE 20 MG PO CPDR Oral Take 1 capsule (20 mg total) by mouth daily. 90 capsule 1  . FUROSEMIDE 20 MG PO TABS Oral Take 20 mg by mouth 2 (two) times daily.       BP 124/60  Pulse 56  Temp(Src) 99.3 F (37.4 C) (Oral)  Resp 20  Wt 175 lb (79.379 kg)  SpO2 97%  Physical Exam  Nursing note and vitals reviewed. Constitutional: She appears well-developed and well-nourished.    ED Course  ORTHOPEDIC INJURY TREATMENT Date/Time: 11/12/2011 8:34 AM Performed by: Hilario Quarry Authorized by: Hilario Quarry Consent: Written consent obtained. Risks and benefits: risks, benefits and alternatives were discussed Consent given by:  patient and spouse Patient understanding: patient states understanding of the procedure being performed Patient consent: the patient's understanding of the procedure matches consent given Patient identity confirmed: verbally with patient, arm band and hospital-assigned identification number Injury location: ankle Location details: left ankle Injury type: fracture-dislocation Fracture type: bimalleolar Pre-procedure neurovascular assessment: neurovascularly intact Pre-procedure range of motion: reduced Local anesthesia used: no Patient sedated: yes Sedatives: etomidate Analgesia: hydromorphone Sedation start date/time: 11/12/2011 8:20 AM Sedation end date/time: 11/12/2011 8:40 AM Vitals: Vital signs were monitored during sedation. Manipulation performed: yes Immobilization: splint Splint type: short leg Supplies used: Ortho-Glass Post-procedure neurovascular assessment: post-procedure neurovascularly intact Post-procedure distal perfusion: normal Post-procedure neurological function: normal Post-procedure range of motion: unchanged Patient tolerance: Patient tolerated the procedure well with no immediate complications.  Procedural sedation Date/Time: 11/12/2011 8:40 AM Performed by: Hilario Quarry Authorized by: Hilario Quarry Consent: Written consent obtained. Risks and benefits: risks, benefits and alternatives were discussed Consent given by: patient and spouse Patient understanding: patient states understanding of the procedure being performed Patient consent: the patient's understanding of the procedure matches consent given Procedure consent: procedure consent matches procedure scheduled Patient identity confirmed: verbally with patient, arm band and hospital-assigned identification number Time out: Immediately prior to procedure a "time out" was called to verify the correct patient, procedure, equipment, support staff and site/side marked as required. Local anesthesia  used: no Patient sedated: yes Sedation type: moderate (conscious) sedation Sedatives: etomidate Analgesia: hydromorphone Sedation start date/time: 11/12/2011 8:20 AM Sedation end date/time: 11/12/2011 8:41 AM Vitals: Vital signs were monitored during sedation. Patient tolerance: Patient tolerated the procedure well with no immediate complications.   (including critical care time)  Labs Reviewed - No data to display No results found.   No diagnosis found.    MDM  Dg Ankle 2 Views Left  11/12/2011  *RADIOLOGY REPORT*  Clinical Data: Fall with pain and deformity  LEFT ANKLE - 2 VIEW  Comparison: None.  Findings: There is fracture dislocation of the ankle joint.  There is a transverse fracture of the medial malleolus.  There is an oblique fracture of the distal fibula.  Talar dome is dislocated laterally and posterior relative to the tibial articular surface. There is medial and the anterior angulation of the fibular fracture.  No fracture of the talus or bones of the foot identified.  IMPRESSION: Fracture dislocation of the ankle joint as described.  Original Report Authenticated By: Thomasenia Sales, M.D.  Dg Chest Port 1 View  11/12/2011  *RADIOLOGY REPORT*  Clinical Data: Patient fell today, cough, congestion  PORTABLE CHEST - 1 VIEW  Comparison: Chest x-[place] [facility] of 06/25/2008  Findings: There is vague opacity in the left mid lung field. This may represent pneumonia, but a neoplasm cannot be excluded and follow-up chest x-Mennie Spiller is recommended to ensure clearing.  Otherwise the lungs are clear.  Mediastinal contours appear stable.  There is mild cardiomegaly present.  There are degenerative changes throughout the thoracic spine.  IMPRESSION:  1.  Opacity in the left midlung may represent pneumonia, but recommend follow-up chest x-Kaylem Gidney to exclude neoplasm. 2.  Stable cardiomegaly.  Original Report Authenticated By: Juline Patch, M.D.    Date: 11/12/2011  Rate: 57  Rhythm: normal sinus rhythm   QRS Axis: normal  Intervals: normal  ST/T Wave abnormalities: nonspecific ST/T changes  Conduction Disutrbances:right bundle branch block  Narrative Interpretation:   Old EKG Reviewed: none available  Consulted Dr. Lequita Halt and he will see patient today.  Awaiting post reduction x-rays.  Results for orders placed during the hospital encounter of 11/12/11  CBC      Component Value Range   WBC 12.4 (*) 4.0 - 10.5 (K/uL)   RBC 4.30  3.87 - 5.11 (MIL/uL)   Hemoglobin 12.5  12.0 - 15.0 (g/dL)   HCT 16.1  09.6 - 04.5 (%)   MCV 84.2  78.0 - 100.0 (fL)   MCH 29.1  26.0 - 34.0 (pg)   MCHC 34.5  30.0 - 36.0 (g/dL)   RDW 40.9  81.1 - 91.4 (%)   Platelets 188  150 - 400 (K/uL)  DIFFERENTIAL      Component Value Range   Neutrophils Relative 84 (*) 43 - 77 (%)   Neutro Abs 10.5 (*) 1.7 - 7.7 (K/uL)   Lymphocytes Relative 9 (*) 12 - 46 (%)   Lymphs Abs 1.1  0.7 - 4.0 (K/uL)   Monocytes Relative 7  3 - 12 (%)   Monocytes Absolute 0.8  0.1 - 1.0 (K/uL)   Eosinophils Relative 0  0 - 5 (%)   Eosinophils Absolute 0.0  0.0 - 0.7 (K/uL)   Basophils Relative 0  0 - 1 (%)   Basophils Absolute 0.0  0.0 - 0.1 (K/uL)  COMPREHENSIVE METABOLIC PANEL      Component Value Range   Sodium 136  135 - 145 (mEq/L)   Potassium 2.9 (*) 3.5 - 5.1 (mEq/L)   Chloride 99  96 - 112 (mEq/L)   CO2 26  19 - 32 (mEq/L)   Glucose, Bld 127 (*) 70 - 99 (mg/dL)   BUN 12  6 - 23 (mg/dL)   Creatinine, Ser 7.82  0.50 - 1.10 (mg/dL)   Calcium 9.1  8.4 - 95.6 (mg/dL)   Total Protein 6.8  6.0 - 8.3 (g/dL)   Albumin 3.5  3.5 - 5.2 (g/dL)   AST 20  0 - 37 (U/L)   ALT 11  0 - 35 (U/L)   Alkaline Phosphatase 74  39 - 117 (U/L)   Total Bilirubin 0.7  0.3 - 1.2 (mg/dL)   GFR calc non Af Amer 83 (*) >90 (mL/min)   GFR calc Af Amer >90  >90 (mL/min)  TROPONIN I      Component Value Range   Troponin I <0.30  <0.30 (ng/mL)  CK TOTAL AND CKMB      Component Value Range   Total CK 41  7 - 177 (U/L)  CK, MB 1.8  0.3 - 4.0  (ng/mL)   Relative Index RELATIVE INDEX IS INVALID  0.0 - 2.5   PROTIME-INR      Component Value Range   Prothrombin Time 14.4  11.6 - 15.2 (seconds)   INR 1.10  0.00 - 1.49   APTT      Component Value Range   aPTT 30  24 - 37 (seconds)   Dg Ankle 2 Views Left  11/12/2011  *RADIOLOGY REPORT*  Clinical Data: Fall with pain and deformity  LEFT ANKLE - 2 VIEW  Comparison: None.  Findings: There is fracture dislocation of the ankle joint.  There is a transverse fracture of the medial malleolus.  There is an oblique fracture of the distal fibula.  Talar dome is dislocated laterally and posterior relative to the tibial articular surface. There is medial and the anterior angulation of the fibular fracture.  No fracture of the talus or bones of the foot identified.  IMPRESSION: Fracture dislocation of the ankle joint as described.  Original Report Authenticated By: Thomasenia Sales, M.D.   Dg Ankle Complete Left  11/12/2011  *RADIOLOGY REPORT*  Clinical Data: Post reduction  LEFT ANKLE COMPLETE - 3+ VIEW  Comparison: 11/12/2011  Findings: Fracture dislocation of the ankle with partial reduction. Alignment is significantly improved.  There is a fracture of the medial malleolus and distal fibula.  There remains widening of  the ankle joint anteriorly and medially.  Plaster has been placed obscuring bony detail.  IMPRESSION: Partial reduction of fracture dislocation of the ankle.  Original Report Authenticated By: Camelia Phenes, M.D.   Dg Chest Port 1 View  11/12/2011  *RADIOLOGY REPORT*  Clinical Data: Patient fell today, cough, congestion  PORTABLE CHEST - 1 VIEW  Comparison: Chest x-Antoneo Ghrist of 06/25/2008  Findings: There is vague opacity in the left mid lung field. This may represent pneumonia, but a neoplasm cannot be excluded and follow-up chest x-Treshaun Carrico is recommended to ensure clearing.  Otherwise the lungs are clear.  Mediastinal contours appear stable.  There is mild cardiomegaly present.  There are  degenerative changes throughout the thoracic spine.  IMPRESSION:  1.  Opacity in the left midlung may represent pneumonia, but recommend follow-up chest x-Carrina Schoenberger to exclude neoplasm. 2.  Stable cardiomegaly.  Original Report Authenticated By: Juline Patch, M.D.   Dg Knee Complete 4 Views Left  11/12/2011  *RADIOLOGY REPORT*  Clinical Data: Fall  LEFT KNEE - COMPLETE 4+ VIEW  Comparison: None.  Findings: Normal alignment.  No fracture.  Mild degenerative change in the knee joint with spurring of the medial tibial spine and mild spurring of the patella.  No joint effusion.  IMPRESSION: Negative for fracture.  Original Report Authenticated By: Camelia Phenes, M.D.    Hilario Quarry, MD 11/13/11 [phone]

## 2011-11-13 ENCOUNTER — Inpatient Hospital Stay (HOSPITAL_COMMUNITY): Payer: Medicare Other

## 2011-11-13 ENCOUNTER — Encounter (HOSPITAL_COMMUNITY): Payer: Self-pay | Admitting: Anesthesiology

## 2011-11-13 DIAGNOSIS — R55 Syncope and collapse: Secondary | ICD-10-CM

## 2011-11-13 LAB — COMPREHENSIVE METABOLIC PANEL
Alkaline Phosphatase: 59 U/L (ref 39–117)
BUN: 10 mg/dL (ref 6–23)
Calcium: 8.8 mg/dL (ref 8.4–10.5)
GFR calc Af Amer: >90 mL/min (ref >90)
Glucose, Bld: 93 mg/dL (ref 70–99)
Potassium: 3.3 mEq/L — ABNORMAL LOW (ref 3.5–5.1)
Total Protein: 5.7 g/dL — ABNORMAL LOW (ref 6.0–8.3)

## 2011-11-13 LAB — CARDIAC PANEL(CRET KIN+CKTOT+MB+TROPI)
CK, MB: 3.2 ng/mL (ref 0.3–4.0)
Relative Index: 3 — ABNORMAL HIGH (ref 0.0–2.5)
Total CK: 107 U/L (ref 7–177)
Troponin I: <0.3 ng/mL (ref <0.30)

## 2011-11-13 LAB — APTT: aPTT: 38 seconds — ABNORMAL HIGH (ref 24–37)

## 2011-11-13 LAB — CBC
HCT: 31.9 % — ABNORMAL LOW (ref 36.0–46.0)
Platelets: 154 10*3/uL (ref 150–400)
RDW: 12.7 % (ref 11.5–15.5)
WBC: 9.4 10*3/uL (ref 4.0–10.5)

## 2011-11-13 LAB — PROTIME-INR: INR: 1.17 (ref 0.00–1.49)

## 2011-11-13 LAB — TSH: TSH: 1.262 u[IU]/mL (ref 0.350–4.500)

## 2011-11-13 MED ORDER — HYDROMORPHONE HCL PF 1 MG/ML IJ SOLN
1.0000 mg | INTRAMUSCULAR | Status: AC
  Administered 2011-11-13: 1 mg via INTRAVENOUS
  Filled 2011-11-13: qty 1

## 2011-11-13 MED ORDER — ACETAMINOPHEN 325 MG PO TABS
650.0000 mg | ORAL_TABLET | ORAL | Status: DC | PRN
  Administered 2011-11-13 – 2011-11-16 (×8): 650 mg via ORAL
  Filled 2011-11-13 (×6): qty 2
  Filled 2011-11-13: qty 1
  Filled 2011-11-13: qty 2
  Filled 2011-11-13: qty 1

## 2011-11-13 MED ORDER — SODIUM CHLORIDE 0.9 % IV BOLUS (SEPSIS)
500.0000 mL | Freq: Once | INTRAVENOUS | Status: AC
  Administered 2011-11-13: 500 mL via INTRAVENOUS

## 2011-11-13 MED ORDER — MAGNESIUM SULFATE 40 MG/ML IJ SOLN
2.0000 g | Freq: Once | INTRAMUSCULAR | Status: AC
  Administered 2011-11-13: 2 g via INTRAVENOUS
  Filled 2011-11-13: qty 50

## 2011-11-13 MED ORDER — POTASSIUM CHLORIDE CRYS ER 20 MEQ PO TBCR
40.0000 meq | EXTENDED_RELEASE_TABLET | Freq: Two times a day (BID) | ORAL | Status: DC
  Administered 2011-11-13 – 2011-11-18 (×11): 40 meq via ORAL
  Filled 2011-11-13 (×12): qty 2

## 2011-11-13 NOTE — Progress Notes (Signed)
Physical Therapy Cancel/Discharge Note Patient currently under bedrest due to ankle fracture awaiting orthopedic intervention.  Please re-consult physical therapy after surgical fixation completed.  Thanks.  Kennedy, [place] [phone] 11/13/2011

## 2011-11-13 NOTE — Progress Notes (Signed)
Orthopedics input greatly appreciated.  Patient is a medium risk candidate for an average risk procedure. However, the risks outweigh the benefits of emergent surgical intervention. Patient likely had syncopal event due to orthostasis. She has not had an arrhythmias on telemetry and her cardiac work up so far is unremarkable. Would proceed with the needed acute surgical intervention. I have explained this to patient and her daughter who is at bedside.  SUBJECTIVE Feels ok, except for the unbearable pain with passive movement of leg.   1. Syncope   2. Community acquired pneumonia   3. Ankle fracture, bimalleolar, closed   4. Diarrhea     Past Medical History  Diagnosis Date  . Diverticulitis 2002    based on CT scan  . Hyperlipidemia   . Hypertension   . Superficial phlebitis     x2, 1999, 2000  . Ocular hypertension     glaucoma suspect, Dr. Dagoberto Ligas  . Skin cancer   . Endometriosis   . Abnormal menses   . Gastritis 2006    @ Endo  . Hiatal hernia   . Diverticulosis   . GERD (gastroesophageal reflux disease)   . DVT (deep venous thrombosis)    Current Facility-Administered Medications  Medication Dose Route Frequency Provider Last Rate Last Dose  . 0.45 % NaCl with KCl 20 mEq / L infusion   Intravenous Continuous Brennley Curtice 75 mL/hr at 11/13/11 0631    . acetaminophen (TYLENOL) tablet 650 mg  650 mg Oral Q4H PRN Rolan Lipa   650 mg at 11/13/11 0437  . albuterol-ipratropium (COMBIVENT) inhaler 1-2 puff  1-2 puff Inhalation Q4H PRN Odarius Dines      . alum & mag hydroxide-simeth (MAALOX/MYLANTA) 200-200-20 MG/5ML suspension 30 mL  30 mL Oral Q6H PRN Ammie Warrick      . aspirin EC tablet 81 mg  81 mg Oral Daily Delcia Spitzley   81 mg at 11/12/11 2152  . azithromycin (ZITHROMAX) 500 mg in dextrose 5 % 250 mL IVPB  500 mg Intravenous Q24H Jonise Weightman   500 mg at 11/13/11 1021  . carvedilol (COREG) tablet 12.5 mg  12.5 mg Oral BID WC Laylamarie Meuser   12.5 mg at  11/13/11 1703  . cefTRIAXone (ROCEPHIN) 1 g in dextrose 5 % 50 mL IVPB  1 g Intravenous Q24H Nicodemus Denk   1 g at 11/13/11 1305  . colestipol (COLESTID) tablet 1 g  1 g Oral BID Kelden Lavallee   1 g at 11/12/11 2348  . dicyclomine (BENTYL) capsule 10 mg  10 mg Oral Q6H PRN Carleen Rhue      . docusate sodium (COLACE) capsule 100 mg  100 mg Oral BID [place] Throckmorton   100 mg at 11/13/11 1000  . etomidate (AMIDATE) injection   Intravenous PRN Hilario Quarry, MD   12 mg at 11/12/11 1610  . fluticasone (FLONASE) 50 MCG/ACT nasal spray 1 spray  1 spray Each Nare BID PRN Beatric Fulop      . guaiFENesin (MUCINEX) 12 hr tablet 1,200 mg  1,200 mg Oral BID Burke Terry   1,200 mg at 11/13/11 1021  . HYDROmorphone (DILAUDID) injection 1 mg  1 mg Intravenous Q4H PRN Yulonda Wheeling   1 mg at 11/13/11 0744  . HYDROmorphone (DILAUDID) injection 1 mg  1 mg Intravenous NOW Almedia Balls Norris   1 mg at 11/13/11 1045  . latanoprost (XALATAN) 0.005 % ophthalmic solution 1 drop  1 drop Both Eyes QHS Estes Lehner  1 drop at 11/12/11 2310  . loratadine (CLARITIN) tablet 10 mg  10 mg Oral Daily Alexie Lanni   10 mg at 11/12/11 2151  . magnesium sulfate IVPB 2 g 50 mL  2 g Intravenous Once Alany Borman   2 g at 11/13/11 1529  . montelukast (SINGULAIR) tablet 10 mg  10 mg Oral QHS Lenore Moyano   10 mg at 11/12/11 2307  . mulitivitamin with minerals tablet 1 tablet  1 tablet Oral Daily Anaaya Fuster   1 tablet at 11/13/11 1021  . ondansetron (ZOFRAN) tablet 4 mg  4 mg Oral Q6H PRN Amairani Shuey       Or  . ondansetron (ZOFRAN) injection 4 mg  4 mg Intravenous Q6H PRN Angelina Venard   4 mg at 11/12/11 2306  . oxyCODONE (Oxy IR/ROXICODONE) immediate release tablet 5 mg  5 mg Oral Q4H PRN Bunyan Brier   5 mg at 11/13/11 1754  . pantoprazole (PROTONIX) EC tablet 40 mg  40 mg Oral Q1200 Ailyne Pawley   40 mg at 11/12/11 2151  . potassium chloride SA (K-DUR,KLOR-CON) CR tablet 40 mEq  40 mEq Oral BID Misha Vanoverbeke   40 mEq at  11/13/11 1528  . senna (SENOKOT) tablet 8.6 mg  1 tablet Oral BID Zion Lint   8.6 mg at 11/13/11 1021  . sodium chloride 0.9 % bolus 500 mL  500 mL Intravenous Once Ali Lowe Callahan   500 mL at 11/13/11 0438  . zolpidem (AMBIEN) tablet 5 mg  5 mg Oral QHS PRN Josy Peaden      . DISCONTD: enoxaparin (LOVENOX) injection 40 mg  40 mg Subcutaneous Q24H Levie Wages   40 mg at 11/12/11 2153  . DISCONTD: losartan (COZAAR) tablet 100 mg  100 mg Oral Daily Laylani Pudwill   100 mg at 11/12/11 2152   Allergies  Allergen Reactions  . Oxybutynin Chloride     Tongue swelling; Rx from Dr Dannette Barbara  . Propoxyphene N-Acetaminophen     REACTION: made pt blood to thin  . Alendronate Sodium   . Ramipril     REACTION: cough   Principal Problem:  *Pneumonia Active Problems:  Ankle fracture, left  Syncope  Hypokalemia  RBBB (right bundle branch block)  HTN (hypertension), malignant  GERD (gastroesophageal reflux disease)   Vital signs in last 24 hours: Temp:  [98.2 F (36.8 C)-100.8 F (38.2 C)] 98.2 F (36.8 C) (12/22 1315) Pulse Rate:  [64-73] 70  (12/22 1315) Resp:  [18-19] 19  (12/22 1315) BP: (94-136)/(57-73) 94/57 mmHg (12/22 1315) SpO2:  [94 %-96 %] 94 % (12/22 1315) Weight change: -2.879 kg (-6 lb 5.6 oz) Last BM Date: 11/12/11  Intake/Output from previous day: 12/21 0701 - 12/22 0700 In: 800 [I.V.:800] Out: 285 [Urine:285] Intake/Output this shift:    Lab Results:  Basename 11/13/11 0700 11/12/11 0820  WBC 9.4 12.4*  HGB 10.7* 12.5  HCT 31.9* 36.2  PLT 154 188   BMET  Basename 11/13/11 0700 11/12/11 0820  NA 135 136  K 3.3* 2.9*  CL 103 99  CO2 23 26  GLUCOSE 93 127*  BUN 10 12  CREATININE 0.69 0.73  CALCIUM 8.8 9.1    Studies/Results: Dg Ankle 2 Views Left  11/13/2011  *RADIOLOGY REPORT*  Clinical Data: Post reduction  LEFT ANKLE - 2 VIEW  Comparison: The left ankle films from 11/12/2011  Findings:  There is little change in the tibiotalar  subluxation with the talus lateral to the distal tibia  on the frontal view. The medial and lateral malleolar fractures appear better positioned.  IMPRESSION: Little change in tibiotalar subluxation with medial and lateral malleolar fractures in better position compared to the initial images.  Original Report Authenticated By: Juline Patch, M.D.   Dg Ankle 2 Views Left  11/12/2011  *RADIOLOGY REPORT*  Clinical Data: Fall with pain and deformity  LEFT ANKLE - 2 VIEW  Comparison: None.  Findings: There is fracture dislocation of the ankle joint.  There is a transverse fracture of the medial malleolus.  There is an oblique fracture of the distal fibula.  Talar dome is dislocated laterally and posterior relative to the tibial articular surface. There is medial and the anterior angulation of the fibular fracture.  No fracture of the talus or bones of the foot identified.  IMPRESSION: Fracture dislocation of the ankle joint as described.  Original Report Authenticated By: Thomasenia Sales, M.D.   Dg Ankle Complete Left  11/12/2011  *RADIOLOGY REPORT*  Clinical Data: Post reduction  LEFT ANKLE COMPLETE - 3+ VIEW  Comparison: 11/12/2011  Findings: Fracture dislocation of the ankle with partial reduction. Alignment is significantly improved.  There is a fracture of the medial malleolus and distal fibula.  There remains widening of  the ankle joint anteriorly and medially.  Plaster has been placed obscuring bony detail.  IMPRESSION: Partial reduction of fracture dislocation of the ankle.  Original Report Authenticated By: Camelia Phenes, M.D.   Ct Head Wo Contrast  11/12/2011  *RADIOLOGY REPORT*  Clinical Data:  75 year old female with syncope.  Fall at 4:00 a.m. today.  CT HEAD WITHOUT CONTRAST CT CERVICAL SPINE WITHOUT CONTRAST  Technique:  Multidetector CT imaging of the head and cervical spine was performed following the standard protocol without intravenous contrast.  Multiplanar CT image reconstructions of the  cervical spine were also generated.  Comparison:  None.  CT HEAD  Findings: Scattered paranasal sinus mucosal thickening.  No sinus fluid levels.  Mastoids are clear. No acute osseous abnormality identified.  Postoperative changes to the left globe.  Otherwise negative orbit and scalp soft tissues.  Cerebral volume is within normal limits for age.  No midline shift, ventriculomegaly, mass effect, evidence of mass lesion, intracranial hemorrhage or evidence of cortically based acute infarction.  Gray-white matter differentiation is within normal limits throughout the brain.  No suspicious intracranial vascular hyperdensity. Calcified atherosclerosis at the skull base.  IMPRESSION:  Normal noncontrast CT appearance of the brain for age.  CT CERVICAL SPINE  Findings: Visualized skull base is intact.  No atlanto-occipital dissociation.  Straightening of cervical lordosis. Cervicothoracic junction alignment is within normal limits.  Bilateral posterior element alignment is within normal limits.  Multilevel advanced facet degeneration.  Less pronounced disc degeneration.  No acute cervical fracture. Visualized paraspinal soft tissues are within normal limits.  IMPRESSION: No acute fracture or listhesis identified in the cervical spine. Ligamentous injury is not excluded.  Advanced facet degeneration.  Original Report Authenticated By: Harley Hallmark, M.D.   Ct Cervical Spine Wo Contrast  11/12/2011  *RADIOLOGY REPORT*  Clinical Data:  75 year old female with syncope.  Fall at 4:00 a.m. today.  CT HEAD WITHOUT CONTRAST CT CERVICAL SPINE WITHOUT CONTRAST  Technique:  Multidetector CT imaging of the head and cervical spine was performed following the standard protocol without intravenous contrast.  Multiplanar CT image reconstructions of the cervical spine were also generated.  Comparison:  None.  CT HEAD  Findings: Scattered paranasal sinus mucosal thickening.  No sinus  fluid levels.  Mastoids are clear. No acute  osseous abnormality identified.  Postoperative changes to the left globe.  Otherwise negative orbit and scalp soft tissues.  Cerebral volume is within normal limits for age.  No midline shift, ventriculomegaly, mass effect, evidence of mass lesion, intracranial hemorrhage or evidence of cortically based acute infarction.  Gray-white matter differentiation is within normal limits throughout the brain.  No suspicious intracranial vascular hyperdensity. Calcified atherosclerosis at the skull base.  IMPRESSION:  Normal noncontrast CT appearance of the brain for age.  CT CERVICAL SPINE  Findings: Visualized skull base is intact.  No atlanto-occipital dissociation.  Straightening of cervical lordosis. Cervicothoracic junction alignment is within normal limits.  Bilateral posterior element alignment is within normal limits.  Multilevel advanced facet degeneration.  Less pronounced disc degeneration.  No acute cervical fracture. Visualized paraspinal soft tissues are within normal limits.  IMPRESSION: No acute fracture or listhesis identified in the cervical spine. Ligamentous injury is not excluded.  Advanced facet degeneration.  Original Report Authenticated By: Harley Hallmark, M.D.   Dg Chest Port 1 View  11/12/2011  *RADIOLOGY REPORT*  Clinical Data: Patient fell today, cough, congestion  PORTABLE CHEST - 1 VIEW  Comparison: Chest x-ray of 06/25/2008  Findings: There is vague opacity in the left mid lung field. This may represent pneumonia, but a neoplasm cannot be excluded and follow-up chest x-ray is recommended to ensure clearing.  Otherwise the lungs are clear.  Mediastinal contours appear stable.  There is mild cardiomegaly present.  There are degenerative changes throughout the thoracic spine.  IMPRESSION:  1.  Opacity in the left midlung may represent pneumonia, but recommend follow-up chest x-ray to exclude neoplasm. 2.  Stable cardiomegaly.  Original Report Authenticated By: Juline Patch, M.D.   Dg Knee  Complete 4 Views Left  11/12/2011  *RADIOLOGY REPORT*  Clinical Data: Fall  LEFT KNEE - COMPLETE 4+ VIEW  Comparison: None.  Findings: Normal alignment.  No fracture.  Mild degenerative change in the knee joint with spurring of the medial tibial spine and mild spurring of the patella.  No joint effusion.  IMPRESSION: Negative for fracture.  Original Report Authenticated By: Camelia Phenes, M.D.    Medications: I have reviewed the patient's current medications.   Physical exam GENERAL- alert HEAD- normal atraumatic, no neck masses, normal thyroid, no jvd RESPIRATORY- appears well, vitals normal, no respiratory distress, acyanotic, normal RR, ear and throat exam is normal, neck free of mass or lymphadenopathy, chest clear, no wheezing, crepitations, rhonchi, normal symmetric air entry CVS- regular rate and rhythm, S1, S2 normal, no murmur, click, rub or gallop ABDOMEN- abdomen is soft without significant tenderness, masses, organomegaly or guarding NEURO- Grossly normal EXTREMITIES- left leg in cast.  Plan   .Pneumonia- better, wbc normal. Continue abx to complete 7 days. .Syncope- likely orthostasis. Follow 2decho. .Hypokalemia- likely due to gi loss. Replenish. Replenish magnesium. .RBBB (right bundle branch block)- tele/cardiac enzymes, likely old. Marland KitchenHTN (hypertension), malignant- BP low normal, hold arb in prep for surgery. Marland KitchenGERD (gastroesophageal reflux disease)- on ppi. Marland KitchenAnkle fracture, left- defer mx to Ortho.  Condition guarded. Discussed plan of care with husband.        Skyeler Smola 11/13/2011 7:31 PM Pager: [phone].

## 2011-11-13 NOTE — Progress Notes (Signed)
Pre med for procedure with Dr. Ranell Patrick to replace splint.

## 2011-11-13 NOTE — Progress Notes (Signed)
Orthopedics Progress Note  Subjective: Pt states the ankle is stable currently and pain is under control as long as she does not move the leg. Pt is currently being worked up for syncope and having treatment for pneumonia.   Objective:  Filed Vitals:   11/13/11 0400  BP: 124/73  Pulse: 64  Temp: 100.8 F (38.2 C)  Resp: 18    General: Awake and alert  Musculoskeletal: Left lower extremity nv intact distally in splint and stable currently Neurovascularly intact  Lab Results  Component Value Date   WBC 9.4 11/13/2011   HGB 10.7* 11/13/2011   HCT 31.9* 11/13/2011   MCV 85.5 11/13/2011   PLT 154 11/13/2011       Component Value Date/Time   NA 135 11/13/2011 0700   K 3.3* 11/13/2011 0700   CL 103 11/13/2011 0700   CO2 23 11/13/2011 0700   GLUCOSE 93 11/13/2011 0700   BUN 10 11/13/2011 0700   CREATININE 0.69 11/13/2011 0700   CALCIUM 8.8 11/13/2011 0700   GFRNONAA 84* 11/13/2011 0700   GFRAA >90 11/13/2011 0700    Lab Results  Component Value Date   INR 1.17 11/13/2011   INR 1.10 11/12/2011   INR 1.0 09/30/2008    Assessment/Plan: Left ankle fx/dislocation in the setting of syncope and pneumonia  Plan: Will await medical clearance before proceeding with any surgery for her ankle. Ankle is stable currently in the splint. Recommend non weight bearing to left lower leg  Almedia Balls. Ranell Patrick, MD 11/13/2011 8:59 AM

## 2011-11-13 NOTE — Progress Notes (Signed)
VASCULAR LAB PRELIMINARY  PRELIMINARY  PRELIMINARY  PRELIMINARY  Carotid duplex completed.    Preliminary report:  Bilateral:  No evidence of hemodynamically significant internal carotid artery stenosis.   Vertebral artery flow is antegrade.     Valerie Rollins, 11/13/2011, 8:36 AM

## 2011-11-14 ENCOUNTER — Encounter (HOSPITAL_COMMUNITY): Payer: Self-pay | Admitting: Anesthesiology

## 2011-11-14 ENCOUNTER — Encounter (HOSPITAL_COMMUNITY)
Admission: EM | Disposition: A | Discharge status: Skilled Nursing Facility | Payer: Self-pay | Source: Home / Self Care | Attending: Internal Medicine

## 2011-11-14 ENCOUNTER — Inpatient Hospital Stay (HOSPITAL_COMMUNITY): Payer: Medicare Other | Admitting: Anesthesiology

## 2011-11-14 DIAGNOSIS — I369 Nonrheumatic tricuspid valve disorder, unspecified: Secondary | ICD-10-CM

## 2011-11-14 HISTORY — PX: ORIF ANKLE FRACTURE: SHX5408

## 2011-11-14 LAB — CBC
Hemoglobin: 10.9 g/dL — ABNORMAL LOW (ref 12.0–15.0)
MCHC: 33.6 g/dL (ref 30.0–36.0)
RDW: 12.3 % (ref 11.5–15.5)
WBC: 8.7 10*3/uL (ref 4.0–10.5)

## 2011-11-14 LAB — URINE CULTURE
Colony Count: NO GROWTH
Culture  Setup Time: 201212221134

## 2011-11-14 LAB — BASIC METABOLIC PANEL
Chloride: 106 mEq/L (ref 96–112)
GFR calc Af Amer: >90 mL/min (ref >90)
GFR calc non Af Amer: 84 mL/min — ABNORMAL LOW (ref >90)
Glucose, Bld: 104 mg/dL — ABNORMAL HIGH (ref 70–99)
Potassium: 4.1 mEq/L (ref 3.5–5.1)
Sodium: 138 mEq/L (ref 135–145)

## 2011-11-14 SURGERY — OPEN REDUCTION INTERNAL FIXATION (ORIF) ANKLE FRACTURE
Anesthesia: General | Site: Ankle | Laterality: Left | Wound class: Clean

## 2011-11-14 MED ORDER — FENTANYL CITRATE 0.05 MG/ML IJ SOLN
INTRAMUSCULAR | Status: DC | PRN
  Administered 2011-11-14: 100 ug via INTRAVENOUS
  Administered 2011-11-14: 50 ug via INTRAVENOUS
  Administered 2011-11-14: 100 ug via INTRAVENOUS

## 2011-11-14 MED ORDER — EPHEDRINE SULFATE 50 MG/ML IJ SOLN
INTRAMUSCULAR | Status: DC | PRN
  Administered 2011-11-14: 10 mg via INTRAVENOUS

## 2011-11-14 MED ORDER — RIVAROXABAN 10 MG PO TABS
10.0000 mg | ORAL_TABLET | Freq: Every day | ORAL | Status: DC
  Administered 2011-11-15 – 2011-11-18 (×4): 10 mg via ORAL
  Filled 2011-11-14 (×5): qty 1

## 2011-11-14 MED ORDER — METHOCARBAMOL 500 MG PO TABS
500.0000 mg | ORAL_TABLET | Freq: Three times a day (TID) | ORAL | Status: DC | PRN
  Administered 2011-11-14 – 2011-11-16 (×5): 500 mg via ORAL
  Filled 2011-11-14 (×5): qty 1

## 2011-11-14 MED ORDER — MEPERIDINE HCL 25 MG/ML IJ SOLN: 6.2500 mg | INTRAMUSCULAR | Status: DC | PRN

## 2011-11-14 MED ORDER — FLUCONAZOLE 200 MG PO TABS
200.0000 mg | ORAL_TABLET | Freq: Once | ORAL | Status: DC
  Filled 2011-11-14: qty 1

## 2011-11-14 MED ORDER — PROMETHAZINE HCL 25 MG/ML IJ SOLN: 6.2500 mg | INTRAMUSCULAR | Status: DC | PRN

## 2011-11-14 MED ORDER — LEVOFLOXACIN IN D5W 750 MG/150ML IV SOLN
750.0000 mg | INTRAVENOUS | Status: DC
  Administered 2011-11-14 – 2011-11-17 (×4): 750 mg via INTRAVENOUS
  Filled 2011-11-14 (×5): qty 150

## 2011-11-14 MED ORDER — RIVAROXABAN 10 MG PO TABS: 10.0000 mg | ORAL_TABLET | Freq: Every day | ORAL | Status: DC

## 2011-11-14 MED ORDER — LACTATED RINGERS IV SOLN
INTRAVENOUS | Status: DC | PRN
  Administered 2011-11-14 (×2): via INTRAVENOUS

## 2011-11-14 MED ORDER — METHOCARBAMOL 100 MG/ML IJ SOLN
500.0000 mg | Freq: Three times a day (TID) | INTRAMUSCULAR | Status: DC | PRN
  Filled 2011-11-14 (×2): qty 5

## 2011-11-14 MED ORDER — SUCCINYLCHOLINE CHLORIDE 20 MG/ML IJ SOLN
INTRAMUSCULAR | Status: DC | PRN
  Administered 2011-11-14: 100 mg via INTRAVENOUS

## 2011-11-14 MED ORDER — HYDROMORPHONE HCL PF 1 MG/ML IJ SOLN: 0.2500 mg | INTRAMUSCULAR | Status: DC | PRN

## 2011-11-14 MED ORDER — PROPOFOL 10 MG/ML IV BOLUS
INTRAVENOUS | Status: DC | PRN
  Administered 2011-11-14: 50 mg via INTRAVENOUS
  Administered 2011-11-14: 150 mg via INTRAVENOUS

## 2011-11-14 MED ORDER — FLUCONAZOLE 100 MG PO TABS: 100.0000 mg | ORAL_TABLET | Freq: Every day | ORAL | Status: DC

## 2011-11-14 MED ORDER — 0.9 % SODIUM CHLORIDE (POUR BTL) OPTIME
TOPICAL | Status: DC | PRN
  Administered 2011-11-14: 1000 mL

## 2011-11-14 MED ORDER — LACTATED RINGERS IV SOLN
INTRAVENOUS | Status: DC
  Administered 2011-11-14: 13:00:00 via INTRAVENOUS

## 2011-11-14 SURGICAL SUPPLY — 53 items
BAG ZIPLOCK 12X15 (MISCELLANEOUS) ×2 IMPLANT
BANDAGE ACE 4 STERILE (GAUZE/BANDAGES/DRESSINGS) ×2 IMPLANT
BANDAGE ELASTIC 6 VELCRO ST LF (GAUZE/BANDAGES/DRESSINGS) ×2 IMPLANT
BIT DRILL 2.5X2.75 QC CALB (BIT) ×2 IMPLANT
BIT DRILL 3.5X5.5 QC CALB (BIT) ×2 IMPLANT
CLOTH BEACON ORANGE TIMEOUT ST (SAFETY) ×2 IMPLANT
CUFF TOURN SGL QUICK 34 (TOURNIQUET CUFF) ×1
CUFF TRNQT CYL 34X4X40X1 (TOURNIQUET CUFF) ×1 IMPLANT
DECANTER SPIKE VIAL GLASS SM (MISCELLANEOUS) ×2 IMPLANT
DRAPE C-ARM 42X72 X-RAY (DRAPES) ×2 IMPLANT
DRAPE U-SHAPE 47X51 STRL (DRAPES) IMPLANT
DRESSING XEROFORM 5X9 (GAUZE/BANDAGES/DRESSINGS) ×2 IMPLANT
DRSG PAD ABDOMINAL 8X10 ST (GAUZE/BANDAGES/DRESSINGS) ×4 IMPLANT
ELECT REM PT RETURN 9FT ADLT (ELECTROSURGICAL) ×2
ELECTRODE REM PT RTRN 9FT ADLT (ELECTROSURGICAL) ×1 IMPLANT
GAUZE SPONGE 4X4 12PLY STRL LF (GAUZE/BANDAGES/DRESSINGS) ×2 IMPLANT
GAUZE XEROFORM 1X8 LF (GAUZE/BANDAGES/DRESSINGS) IMPLANT
GLOVE BIOGEL PI IND STRL 8 (GLOVE) ×1 IMPLANT
GLOVE BIOGEL PI INDICATOR 8 (GLOVE) ×1
GLOVE ECLIPSE 8.0 STRL XLNG CF (GLOVE) ×2 IMPLANT
GLOVE SURG SS PI 8.0 STRL IVOR (GLOVE) ×2 IMPLANT
GOWN STRL REIN XL XLG (GOWN DISPOSABLE) ×2 IMPLANT
K-WIRE ACE 1.6X6 (WIRE) ×2
KWIRE ACE 1.6X6 (WIRE) ×1 IMPLANT
MANIFOLD NEPTUNE II (INSTRUMENTS) ×2 IMPLANT
NEEDLE HYPO 22GX1.5 SAFETY (NEEDLE) IMPLANT
PACK LOWER EXTREMITY WL (CUSTOM PROCEDURE TRAY) ×2 IMPLANT
PAD CAST 4YDX4 CTTN HI CHSV (CAST SUPPLIES) ×2 IMPLANT
PADDING CAST COTTON 4X4 STRL (CAST SUPPLIES) ×2
PADDING WEBRIL 4 STERILE (GAUZE/BANDAGES/DRESSINGS) ×2 IMPLANT
PLATE ACE 100DEG 6HOLE (Plate) ×2 IMPLANT
POSITIONER SURGICAL ARM (MISCELLANEOUS) ×2 IMPLANT
SCREW CORTICAL 3.5MM  20MM (Screw) ×2 IMPLANT
SCREW CORTICAL 3.5MM  28MM (Screw) ×1 IMPLANT
SCREW CORTICAL 3.5MM 14MM (Screw) ×4 IMPLANT
SCREW CORTICAL 3.5MM 18MM (Screw) ×2 IMPLANT
SCREW CORTICAL 3.5MM 20MM (Screw) ×2 IMPLANT
SCREW CORTICAL 3.5MM 22MM (Screw) ×2 IMPLANT
SCREW CORTICAL 3.5MM 28MM (Screw) ×1 IMPLANT
SCREW CORTICAL 3.5MM 38MM (Screw) ×2 IMPLANT
SCREW CORTICAL 3.5MM 40MM (Screw) ×2 IMPLANT
SOL PREP POV-IOD 16OZ 10% (MISCELLANEOUS) ×2 IMPLANT
SOL PREP PROV IODINE SCRUB 4OZ (MISCELLANEOUS) ×2 IMPLANT
SPLINT PLASTER CAST XFAST 5X30 (CAST SUPPLIES) ×1 IMPLANT
SPLINT PLASTER XFAST SET 5X30 (CAST SUPPLIES) ×1
SPONGE GAUZE 4X4 12PLY (GAUZE/BANDAGES/DRESSINGS) ×2 IMPLANT
SUT ETHILON 4 0 PS 2 18 (SUTURE) ×8 IMPLANT
SUT VIC AB 2-0 CT1 27 (SUTURE)
SUT VIC AB 2-0 CT1 TAPERPNT 27 (SUTURE) IMPLANT
SUT VIC AB 3-0 SH 27 (SUTURE) ×2
SUT VIC AB 3-0 SH 27X BRD (SUTURE) ×2 IMPLANT
SYR CONTROL 10ML LL (SYRINGE) IMPLANT
TOWEL OR 17X26 10 PK STRL BLUE (TOWEL DISPOSABLE) ×2 IMPLANT

## 2011-11-14 NOTE — Progress Notes (Signed)
Pt received from PACU. No change from AM assessment other than slightly sleepy. Alert and able to hold conversation.  Smiling.  Left leg knee distally to toes in splint covered with ace wrap.  Toes pink and warm.  VSS. Denies pain. Family at bedside.

## 2011-11-14 NOTE — Op Note (Signed)
NAMEKACIA, HALLEY NO.:  [id number]  MEDICAL RECORD NO.:  [id number]  LOCATION:  1443                         FACILITY:  [facility]  PHYSICIAN:  Leonides Grills, M.D.     DATE OF BIRTH:  [birth year]  DATE OF PROCEDURE:  11/14/2011 DATE OF DISCHARGE:                              OPERATIVE REPORT   PREOPERATIVE DIAGNOSIS:  Left trimalleolar ankle fracture.  POSTOPERATIVE DIAGNOSES: 1. Left trimalleolar ankle fracture. 2. Anterolateral ligament rupture ankle.  OPERATION: 1. Open reduction and fixation of left trimalleolar ankle fracture     without posterior malleolar fixation. 2. Left Brostrom procedure  SURGEON:  Leonides Grills, M.D.  ASSISTANT:  Richardean Canal, PA-C.  ANESTHESIA:  General.  ESTIMATED BLOOD LOSS:  Minimal.  TOURNIQUET TIME:  Approximately an hour and 50 minutes.  COMPLICATIONS:  None.  DISPOSITION:  Stable to PAR.  IMPLANTS:  Biomet small frag fixation titanium.  INDICATION:  This is a 75 year old female, who yesterday slipped and fell, sustained the above injury.  She was consented to the above procedure.  All risks of infection, vessel injury, nonunion, malunion, hardware irritation, hardware failure, persistent pain, worse pain, prolonged recovery, stiffness, arthritis, wound healing problems, DVT, and PE were all explained.  Questions were encouraged and answered.  OPERATION IN DETAIL:  The patient was brought to the operating room and placed in supine position.  After adequate general anesthesia was administered as well as Ancef 1 g IV piggyback.  The left lower extremity was then prepped and draped in sterile manner over proximally thigh tourniquet.  Limb was gravity exsanguinated.  Tourniquet was elevated to 290 mmHg.  A longitudinal incision over the left lateral malleolus was then made.  Dissection was carried down through skin. Hemostasis was obtained.  Fracture site was highly comminuted.  There was an avulsion fracture in  two separate areas.  There was a free fragment as well.  We removed the free fragment and irrigated the joint area and copiously irrigated with normal saline.  The anterolateral ligaments were also injured as well.  Once the area was copiously irrigated with normal saline, we assessed the ligament damage.  The entire anterolateral ligament complex was ruptured.  The accessory tib- fib ligament was also ruptured in one area and it was actually of free piece within the joint, this was excised.  We then reduced the lateral malleolus.  Once this was reduced with a two-point reduction clamp, we then placed a 3.5 mm fully-threaded cortical lag screw using a 3.5, 2.5 mm drill hole respectively.  This had excellent purchase and maintenance of the desired position.  We then placed a 3 bend 6 hole 1/3 tubular plate on the lateral portion of the lateral malleolus.  This was in an effort to capture the avulsed fracture fragment off the lateral malleolus that had still ligamentous attachment of the syndesmotic ligament.  First, we had provisionally fix the 2 separate bony fragments with K-wires.  Once this was done, we then applied the 6 hole prebent plate.  We then placed a 3 more proximal 3.5 mm fully-threaded cortical set screws using a 2.5 mm drill hole respectively.  This had good purchase.  Bone was osteopenic.  We then removed the K-wire and then placed a 3.5 mm fully-threaded cortical set screw using a 2.5 mm drill hole respectively.  This had excellent purchase and maintenance of the fragment in the desired position.  We then placed an independent screw to hold the free fragment that was more anterior distal.  Again, we maintained the attachments of the syndesmotic ligament and a portion of the lateral ligamentous complex.  Once this was provisionally fixed, two point reduction clamp was used to maintain the reduction.  A 3.5 mm fully-threaded cortical lag screw was in place using a 3.5, 2.5  mm drill hole respectively.  This had good purchase and maintenance of the desired position, and we placed one more 3.5 mm screw through second from the most distal screw hole.  Again, this was done under C-arm guidance to verify that this was obviously out of the joint and maintained the anatomic position.  X-rays were obtained in the AP, lateral, and oblique planes and showed no gross motion fixation, proposition, and excellent alignment as well.  We then reconstructed the anterolateral ligament complex with 2-0 Vicryl stitch by advancing this into a cup ligament that was maintained on the lateral malleolus.  This entire ligament complex was reconstructed and was nice and strong at the end of the procedure.  Once this was done, we then made a longitudinal incision over medial malleolus, dissection was carried down through the skin.  Hemostasis was obtained.  Fracture site was then encountered. Hematoma as well as periosteum within the fracture site was then removed.  We then opened the fracture site and irrigated the joint copiously, removed any free fragments or hematoma within this area.  We then anatomically reduced the fracture with a two-point reduction clamp and then placed two 3.5 mm fully-threaded cortical lag screw using a 3.5, 2.5 mm drill hole respectively.  This had excellent purchase and maintenance of the desired anatomic position.  We then obtained stress x- rays in AP, lateral, and mortise views showed no gross motion fixation, proposition, and excellent alignment as well.  The posterior malleolar fragment was maintained there was no posterior instability of the ankle whatsoever and because of this, we did not fix the posterior malleolar fragment.  Once this was done, the tourniquet was deflated.  Hemostasis was obtained.  There was no pulsatile bleeding.  Again, the area was copiously irrigated with normal.  Subcu was closed with 2-0 Vicryl. Skin was closed with 4-0  nylon over all wounds.  Sterile dressing was applied.  Modified Jones dressing was applied with the ankle in neutral dorsiflexion.  The patient was stable to the PAR.     Leonides Grills, M.D.     PB/MEDQ  D:  11/14/2011  T:  11/14/2011  Job:  [id number]

## 2011-11-14 NOTE — Progress Notes (Signed)
Transported to OR with family at side.  No ADN.

## 2011-11-14 NOTE — Progress Notes (Signed)
  Echocardiogram 2D Echocardiogram has been performed.  Valerie Rollins 11/14/2011, 9:37 AM

## 2011-11-14 NOTE — Brief Op Note (Signed)
11/12/2011 - 11/14/2011  12:18 PM  PATIENT:  Valerie Rollins  75 y.o. female  PRE-OPERATIVE DIAGNOSIS:  left trimalleolar ankle fracture  POST-OPERATIVE DIAGNOSIS:  left trimalleolar ankle fracture plus anterolateral ligament complex rupture  PROCEDURE:  Procedure(s): 1.  Left OPEN REDUCTION INTERNAL FIXATION (ORIF) Trimalleolar ANKLE FRACTURE without Posterior Malleolar Fixation 2.  Left Brostrom Procedure 3.  Stress xrays left ankle  SURGEON:  Surgeon(s): Sherri Rad, MD  PHYSICIAN ASSISTANT: Rexene Edison, PAC  ASSISTANTS: above   ANESTHESIA:   general  EBL:  Total I/O In: 0  Out: 400 [Urine:400]  BLOOD ADMINISTERED:none  DRAINS: none   LOCAL MEDICATIONS USED:  NONE  SPECIMEN:  No Specimen  DISPOSITION OF SPECIMEN:  N/A  COUNTS:  YES  TOURNIQUET:  * Missing tourniquet times found for documented tourniquets in log:  [id number] *  DICTATION: .Other Dictation: Dictation Number [id number]  PLAN OF CARE: Admit to inpatient   PATIENT DISPOSITION:  PACU - hemodynamically stable.   Delay start of Pharmacological VTE agent (>24hrs) due to surgical blood loss or risk of bleeding:  {YES/NO/NOT APPLICABLE:20182

## 2011-11-14 NOTE — Progress Notes (Signed)
Pt voiding frequently . States does not feel like she is emptying her bladder and c/o pain   Voided 450 ml.  Foley catheter placed per order with 1000 ml urine return.  16 Fr placed without difficulty.

## 2011-11-14 NOTE — Consult Note (Signed)
NAMEISELLA, SLATTEN NO.:  [id number]  MEDICAL RECORD NO.:  [id number]  LOCATION:  1443                         FACILITY:  [facility]  PHYSICIAN:  Leonides Grills, M.D.     DATE OF BIRTH:  09-20-1936  DATE OF CONSULTATION:  11/14/2011 DATE OF DISCHARGE:                                CONSULTATION   CHIEF COMPLAINT:  Left ankle pain since 21st.  HISTORY:  Ms. Eskridge is a 75 year old pleasant female who sustained a left ankle fracture on December 21.  She was initially evaluated by Dr. Lequita Halt who placed her in a posterior mold splint.  Dr. Ranell Patrick saw her yesterday and saw that the patient was in a lot of pain.  I looked at the x-rays and saw that the ankle was subluxed.  He then performed a closed reduction on the floor with posterior U splint application, and then I was called for further evaluation and treatment for surgical fixation.  She does not report any excessive pain.  She is actually comfortable in bed.  ALLERGIES:  OXYBUTYNIN, PROPOXYPHENE, ACETAMINOPHEN, RAMIPRIL, ALENDRONATE.  PAST MEDICAL HISTORY:  Significant for hypertension, GERD, pneumonia, DVT, right bundle-branch block, hyperlipidemia.  MEDICATIONS: 1. Tylenol. 2. Albuterol. 3. Aspirin. 4. Zithromax. 5. Coreg. 6. Rocephin. 7. Colestid. 8. Colace. 9. Bentyl. 10.Flonase. 11.Amidate. 12.Dilaudid. 13.Xalatan ophthalmic drops. 14.Protonix.  SOCIAL HISTORY:  Does not smoke.  No alcohol.  FAMILY HISTORY:  Noncontributory.  PHYSICAL EXAMINATION:  VITAL SIGNS:  She is 99.6, blood pressure is 146/81, pulse is 73, respirations 18. GENERAL:  Well-nourished, well-developed, in no apparent distress.  Very pleasant female. HEENT:  Normocephalic, atraumatic.  Extraocular motions are intact. CHEST:  Equal bilateral expansion and contraction. NEUROLOGIC:  Alert and oriented x3. EXTREMITIES:  Active and passive range of motion.  Toes did not elicit any pain.  Sensation intact to light touch over  her toes.  Compartments are soft in foot and leg through the splint.  No impinging areas of the splint.  Toes are warm.  X-RAYS:  Three views of her foot show a trimalleolar ankle fracture, left ankle, with posterolateral subluxation.  IMPRESSION:  Left trimalleolar ankle fracture, unstable.  PLAN:  I explained to Ms. Moisan as well as her family that due to the instability of her fracture and increased risk of osteoarthritis, I would recommend surgery.  She wished to proceed.  The surgical acquire is open reduction and internal fixation of her left trimalleolar ankle fracture.  We went over the risks which include infection, vessel injury, nonunion, malunion, hardware irritation, hardware failure, persistent pain, worsening pain, prolonged recovery, stiffness, arthritis, wound healing problems, DVT, PE were all explained. Questions were encouraged and answered.  We will place her on likely Xarelto postoperatively for DVT prophylaxis, while she is nonweightbearing which is for 1 month likely and then she will weight bear as tolerated in the CAM walker boot for the second month.  At 2 months' postop, she will likely go on AFO brace.  Elevation and active range of motion of toes are encouraged.  She is n.p.o. and we will take her back to the OR today.     Leonides Grills, M.D.  PB/MEDQ  D:  11/14/2011  T:  11/14/2011  Job:  [id number]

## 2011-11-14 NOTE — Progress Notes (Signed)
Patient ID: Valerie Rollins, female   DOB: May 13, 1936, 75 y.o.   MRN: [id number] Pt seen. Full consult done. Dictation [phone]

## 2011-11-14 NOTE — Progress Notes (Signed)
SUBJECTIVE Feels hot. Couldn't pee.   1. Syncope   2. Community acquired pneumonia   3. Ankle fracture, bimalleolar, closed   4. Diarrhea     Past Medical History  Diagnosis Date  . Diverticulitis 2002    based on CT scan  . Hyperlipidemia   . Hypertension   . Superficial phlebitis     x2, 1999, 2000  . Ocular hypertension     glaucoma suspect, Dr. Dagoberto Ligas  . Skin cancer   . Endometriosis   . Abnormal menses   . Gastritis 2006    @ Endo  . Hiatal hernia   . Diverticulosis   . GERD (gastroesophageal reflux disease)   . DVT (deep venous thrombosis)    Current Facility-Administered Medications  Medication Dose Route Frequency Provider Last Rate Last Dose  . 0.45 % NaCl with KCl 20 mEq / L infusion   Intravenous Continuous Jenesys Casseus 75 mL/hr at 11/13/11 0631    . acetaminophen (TYLENOL) tablet 650 mg  650 mg Oral Q4H PRN Rolan Lipa   650 mg at 11/14/11 1906  . albuterol-ipratropium (COMBIVENT) inhaler 1-2 puff  1-2 puff Inhalation Q4H PRN Mireille Lacombe      . alum & mag hydroxide-simeth (MAALOX/MYLANTA) 200-200-20 MG/5ML suspension 30 mL  30 mL Oral Q6H PRN Marylynne Keelin      . aspirin EC tablet 81 mg  81 mg Oral Daily Mal Asher   81 mg at 11/12/11 2152  . azithromycin (ZITHROMAX) 500 mg in dextrose 5 % 250 mL IVPB  500 mg Intravenous Q24H Riku Buttery   500 mg at 11/14/11 1102  . carvedilol (COREG) tablet 12.5 mg  12.5 mg Oral BID WC Bintou Lafata   12.5 mg at 11/14/11 1744  . colestipol (COLESTID) tablet 1 g  1 g Oral BID Soma Bachand   1 g at 11/13/11 2209  . dicyclomine (BENTYL) capsule 10 mg  10 mg Oral Q6H PRN Levii Hairfield      . docusate sodium (COLACE) capsule 100 mg  100 mg Oral BID Shawneequa Baldridge   100 mg at 11/13/11 2209  . etomidate (AMIDATE) injection   Intravenous PRN Hilario Quarry, MD   12 mg at 11/12/11 1610  . fluticasone (FLONASE) 50 MCG/ACT nasal spray 1 spray  1 spray Each Nare BID PRN Ricketta Colantonio      . guaiFENesin  (MUCINEX) 12 hr tablet 1,200 mg  1,200 mg Oral BID Achilles Neville   1,200 mg at 11/14/11 0940  . HYDROmorphone (DILAUDID) injection 1 mg  1 mg Intravenous Q4H PRN Ashleymarie Granderson   1 mg at 11/14/11 1819  . latanoprost (XALATAN) 0.005 % ophthalmic solution 1 drop  1 drop Both Eyes QHS Shawnya Mayor   1 drop at 11/13/11 2208  . Levofloxacin (LEVAQUIN) IVPB 750 mg  750 mg Intravenous Q24H Caterra Ostroff      . loratadine (CLARITIN) tablet 10 mg  10 mg Oral Daily Camrin Lapre   10 mg at 11/12/11 2151  . methocarbamol (ROBAXIN) 500 mg in dextrose 5 % 50 mL IVPB  500 mg Intravenous Q8H PRN Kirtland Bouchard, PA      . methocarbamol (ROBAXIN) tablet 500 mg  500 mg Oral Q8H PRN Kirtland Bouchard, PA   500 mg at 11/14/11 1818  . montelukast (SINGULAIR) tablet 10 mg  10 mg Oral QHS Nesa Distel   10 mg at 11/13/11 2209  . mulitivitamin with minerals tablet 1 tablet  1 tablet Oral Daily  Everest Hacking   1 tablet at 11/13/11 1021  . ondansetron (ZOFRAN) tablet 4 mg  4 mg Oral Q6H PRN Shawanna Zanders       Or  . ondansetron (ZOFRAN) injection 4 mg  4 mg Intravenous Q6H PRN Broly Hatfield   4 mg at 11/12/11 2306  . oxyCODONE (Oxy IR/ROXICODONE) immediate release tablet 5 mg  5 mg Oral Q4H PRN Lemont Sitzmann   5 mg at 11/14/11 4540  . pantoprazole (PROTONIX) EC tablet 40 mg  40 mg Oral Q1200 Keyari Kleeman   40 mg at 11/12/11 2151  . potassium chloride SA (K-DUR,KLOR-CON) CR tablet 40 mEq  40 mEq Oral BID Emmalyne Giacomo   40 mEq at 11/14/11 0940  . rivaroxaban (XARELTO) tablet 10 mg  10 mg Oral Daily Kirtland Bouchard, PA      . senna [facility]) tablet 8.6 mg  1 tablet Oral BID Symphoni Helbling   8.6 mg at 11/13/11 2209  . zolpidem (AMBIEN) tablet 5 mg  5 mg Oral QHS PRN Sinai Illingworth      . DISCONTD: 0.9 % irrigation (POUR BTL)    PRN Worthy Rancher Bednarz   1,000 mL at 11/14/11 1124  . DISCONTD: cefTRIAXone (ROCEPHIN) 1 g in dextrose 5 % 50 mL IVPB  1 g Intravenous Q24H Dinah Lupa   1 g at 11/13/11 1305  . DISCONTD: enoxaparin  (LOVENOX) injection 40 mg  40 mg Subcutaneous Q24H Jimy Gates   40 mg at 11/12/11 2153  . DISCONTD: fluconazole (DIFLUCAN) tablet 100 mg  100 mg Oral Daily Andrika Peraza      . DISCONTD: fluconazole (DIFLUCAN) tablet 200 mg  200 mg Oral Once Jamirra Curnow      . DISCONTD: HYDROmorphone (DILAUDID) injection 0.25-0.5 mg  0.25-0.5 mg Intravenous Q5 min PRN Phillips Grout, MD      . DISCONTD: lactated ringers infusion   Intravenous Continuous Phillips Grout, MD 125 mL/hr at 11/14/11 1317    . DISCONTD: losartan (COZAAR) tablet 100 mg  100 mg Oral Daily Cordai Rodrigue   100 mg at 11/12/11 2152  . DISCONTD: meperidine (DEMEROL) injection 6.25-12.5 mg  6.25-12.5 mg Intravenous Q5 min PRN Phillips Grout, MD      . DISCONTD: promethazine (PHENERGAN) injection 6.25-12.5 mg  6.25-12.5 mg Intravenous Q15 min PRN Phillips Grout, MD      . DISCONTD: rivaroxaban (XARELTO) tablet 10 mg  10 mg Oral Daily Kirtland Bouchard, PA       Facility-Administered Medications Ordered in Other Encounters  Medication Dose Route Frequency Provider Last Rate Last Dose  . DISCONTD: ePHEDrine injection    PRN Clydene Pugh [place]   10 mg at 11/14/11 1102  . DISCONTD: fentaNYL (SUBLIMAZE) injection    PRN Clydene Pugh [place]   50 mcg at 11/14/11 1200  . DISCONTD: lactated ringers infusion    Continuous PRN Clydene Pugh [place]      . DISCONTD: propofol (DIPRIVAN) 10 mg/mL bolus    PRN Clydene Pugh [place]   50 mg at 11/14/11 1146  . DISCONTD: succinylcholine (ANECTINE) injection    PRN Clydene Pugh [place]   100 mg at 11/14/11 1052   Allergies  Allergen Reactions  . Oxybutynin Chloride     Tongue swelling; Rx from Dr Dannette Barbara  . Propoxyphene N-Acetaminophen     REACTION: made pt blood to thin  . Alendronate Sodium   . Ramipril     REACTION: cough   Principal Problem:  *Pneumonia Active Problems:  Ankle fracture,  left  Syncope  Hypokalemia  RBBB (right bundle branch block)  HTN (hypertension), malignant  GERD  (gastroesophageal reflux disease)   Vital signs in last 24 hours: Temp:  [96.9 F (36.1 C)-102.8 F (39.3 C)] 102.8 F (39.3 C) (12/23 1900) Pulse Rate:  [64-86] 86  (12/23 1900) Resp:  [16-19] 19  (12/23 1900) BP: (90-166)/(53-88) 137/65 mmHg (12/23 1900) SpO2:  [92 %-100 %] 92 % (12/23 1900) Weight change:  Last BM Date: 11/12/11  Intake/Output from previous day: 12/22 0701 - 12/23 0700 In: 2380 [P.O.:1480; I.V.:900] Out: 2250 [Urine:2250] Intake/Output this shift:    Lab Results:  Basename 11/14/11 0540 11/13/11 0700  WBC 8.7 9.4  HGB 10.9* 10.7*  HCT 32.4* 31.9*  PLT 181 154   BMET  Basename 11/14/11 0540 11/13/11 0700  NA 138 135  K 4.1 3.3*  CL 106 103  CO2 26 23  GLUCOSE 104* 93  BUN 7 10  CREATININE 0.70 0.69  CALCIUM 9.1 8.8    Studies/Results: Dg Ankle 2 Views Left  11/13/2011  *RADIOLOGY REPORT*  Clinical Data: Post reduction  LEFT ANKLE - 2 VIEW  Comparison: The left ankle films from 11/12/2011  Findings:  There is little change in the tibiotalar subluxation with the talus lateral to the distal tibia on the frontal view. The medial and lateral malleolar fractures appear better positioned.  IMPRESSION: Little change in tibiotalar subluxation with medial and lateral malleolar fractures in better position compared to the initial images.  Original Report Authenticated By: Juline Patch, M.D.    Medications: I have reviewed the patient's current medications.   Physical exam GENERAL- alert HEAD- normal atraumatic, no neck masses, normal thyroid, no jvd RESPIRATORY- some cough. Now wheezing. CVS- regular rate and rhythm, S1, S2 normal, no murmur, click, rub or gallop ABDOMEN- abdomen is soft without significant tenderness, masses, organomegaly or guarding NEURO- Grossly normal EXTREMITIES- s/p left ankle surgery, no overt bleeding. Foot cold.  Plan   .Pneumonia- better, wbc normal, Not sure of cause of fever. Change abx to levaquin, repeat blood  cultures.Marland Kitchen Marland KitchenSyncope- likely orthostasis. 2decho unremarkable.  Marland KitchenRBBB (right bundle branch block)- tele/cardiac enzymes, likely old. Marland KitchenHTN (hypertension), malignant- BP ok. Continue coreg. Marland KitchenGERD (gastroesophageal reflux disease)- on ppi. Marland KitchenAnkle fracture, left-s/p orif. Appreciate Dr Lestine Box input. Fever- post op. Send blood cultures. If continues to spike/cxr/uc. Condition guarded. Discussed plan of care with daughter and husband.         Aquiles Ruffini 11/14/2011 7:13 PM Pager: [phone].

## 2011-11-14 NOTE — Transfer of Care (Signed)
Immediate Anesthesia Transfer of Care Note  Patient: Valerie Rollins  Procedure(s) Performed:  OPEN REDUCTION INTERNAL FIXATION (ORIF) ANKLE FRACTURE - open reduction internal fixation trimalleolar ankle fracture  Patient Location: PACU  Anesthesia Type: General  Level of Consciousness: awake and sedated  Airway & Oxygen Therapy: Patient Spontanous Breathing and Patient connected to face mask oxygen  Post-op Assessment: Report given to PACU RN and Post -op Vital signs reviewed and stable  Post vital signs: Reviewed and stable  Complications: No apparent anesthesia complications

## 2011-11-14 NOTE — Anesthesia Preprocedure Evaluation (Addendum)
Anesthesia Evaluation  Patient identified by MRN, date of birth, ID band Patient awake    Reviewed: Allergy & Precautions, H&P , NPO status , Patient's Chart, lab work & pertinent test results  Airway Mallampati: II TM Distance: >3 FB Neck ROM: Full    Dental No notable dental hx.    Pulmonary neg pulmonary ROS, asthma ,  clear to auscultation  Pulmonary exam normal       Cardiovascular hypertension, Pt. on medications neg cardio ROS - dysrhythmias - Valvular Problems/MurmursRegular Normal    Neuro/Psych Negative Neurological ROS  Negative Psych ROS   GI/Hepatic negative GI ROS, Neg liver ROS, hiatal hernia, GERD-  Medicated and Controlled,  Endo/Other  Negative Endocrine ROS  Renal/GU negative Renal ROS  Genitourinary negative   Musculoskeletal negative musculoskeletal ROS (+)   Abdominal   Peds negative pediatric ROS (+)  Hematology negative hematology ROS (+)   Anesthesia Other Findings   Reproductive/Obstetrics negative OB ROS                         Anesthesia Physical Anesthesia Plan  ASA: II  Anesthesia Plan: General   Post-op Pain Management:    Induction: Intravenous  Airway Management Planned: LMA  Additional Equipment:   Intra-op Plan:   Post-operative Plan: Extubation in OR  Informed Consent: I have reviewed the patients History and Physical, chart, labs and discussed the procedure including the risks, benefits and alternatives for the proposed anesthesia with the patient or authorized representative who has indicated his/her understanding and acceptance.   Dental advisory given  Plan Discussed with: CRNA  Anesthesia Plan Comments:        Anesthesia Quick Evaluation

## 2011-11-14 NOTE — Anesthesia Postprocedure Evaluation (Signed)
  Anesthesia Post-op Note  Patient: Valerie Rollins  Procedure(s) Performed:  OPEN REDUCTION INTERNAL FIXATION (ORIF) ANKLE FRACTURE - open reduction internal fixation trimalleolar ankle fracture  Patient Location: PACU  Anesthesia Type: General  Level of Consciousness: awake and alert   Airway and Oxygen Therapy: Patient Spontanous Breathing  Post-op Pain: mild  Post-op Assessment: Post-op Vital signs reviewed, Patient's Cardiovascular Status Stable, Respiratory Function Stable, Patent Airway and No signs of Nausea or vomiting  Post-op Vital Signs: stable  Complications: No apparent anesthesia complications

## 2011-11-14 NOTE — Anesthesia Procedure Notes (Signed)
Procedure Name: Intubation Date/Time: 11/14/2011 10:54 AM Performed by: [place], Aaryav Hopfensperger C Pre-anesthesia Checklist: Patient identified, Timeout performed, Emergency Drugs available, Suction available and Patient being monitored Patient Re-evaluated:Patient Re-evaluated prior to inductionOxygen Delivery Method: Circle System Utilized Preoxygenation: Pre-oxygenation with 100% oxygen Intubation Type: IV induction Ventilation: Mask ventilation without difficulty Laryngoscope Size: Mac and 3 Grade View: Grade I Tube type: Oral Tube size: 7.0 mm Number of attempts: 1 Airway Equipment and Method: stylet Placement Confirmation: ETT inserted through vocal cords under direct vision,  breath sounds checked- equal and bilateral,  positive ETCO2 and CO2 detector Secured at: 20 cm Tube secured with: Tape Dental Injury: Teeth and Oropharynx as per pre-operative assessment

## 2011-11-15 ENCOUNTER — Other Ambulatory Visit: Payer: Self-pay

## 2011-11-15 ENCOUNTER — Encounter (HOSPITAL_COMMUNITY): Payer: Self-pay | Admitting: Cardiology

## 2011-11-15 ENCOUNTER — Inpatient Hospital Stay (HOSPITAL_COMMUNITY): Payer: Medicare Other

## 2011-11-15 DIAGNOSIS — I4891 Unspecified atrial fibrillation: Secondary | ICD-10-CM

## 2011-11-15 LAB — COMPREHENSIVE METABOLIC PANEL
AST: 19 U/L (ref 0–37)
Albumin: 2.5 g/dL — ABNORMAL LOW (ref 3.5–5.2)
Calcium: 9.2 mg/dL (ref 8.4–10.5)
Creatinine, Ser: 0.7 mg/dL (ref 0.50–1.10)
GFR calc non Af Amer: 84 mL/min — ABNORMAL LOW (ref >90)
Total Protein: 6 g/dL (ref 6.0–8.3)

## 2011-11-15 LAB — CBC
MCH: 29 pg (ref 26.0–34.0)
MCHC: 34.3 g/dL (ref 30.0–36.0)
MCV: 84.7 fL (ref 78.0–100.0)
Platelets: 223 10*3/uL (ref 150–400)
RDW: 12.3 % (ref 11.5–15.5)

## 2011-11-15 LAB — CARDIAC PANEL(CRET KIN+CKTOT+MB+TROPI): Total CK: 128 U/L (ref 7–177)

## 2011-11-15 MED ORDER — DILTIAZEM HCL 25 MG/5ML IV SOLN
5.0000 mg | Freq: Once | INTRAVENOUS | Status: DC
  Filled 2011-11-15: qty 5

## 2011-11-15 MED ORDER — DILTIAZEM HCL 25 MG/5ML IV SOLN
5.0000 mg | Freq: Once | INTRAVENOUS | Status: AC
  Administered 2011-11-15: 5 mg via INTRAVENOUS
  Filled 2011-11-15: qty 5

## 2011-11-15 MED ORDER — ALPRAZOLAM 0.25 MG PO TABS
0.2500 mg | ORAL_TABLET | Freq: Two times a day (BID) | ORAL | Status: DC | PRN
  Administered 2011-11-15 – 2011-11-17 (×3): 0.25 mg via ORAL
  Filled 2011-11-15 (×3): qty 1

## 2011-11-15 MED ORDER — CARVEDILOL 6.25 MG PO TABS
18.7500 mg | ORAL_TABLET | Freq: Two times a day (BID) | ORAL | Status: DC
  Administered 2011-11-15 – 2011-11-18 (×6): 18.75 mg via ORAL
  Filled 2011-11-15 (×7): qty 1

## 2011-11-15 MED ORDER — PIPERACILLIN-TAZOBACTAM 3.375 G IVPB
3.3750 g | Freq: Three times a day (TID) | INTRAVENOUS | Status: DC
  Administered 2011-11-15 – 2011-11-18 (×10): 3.375 g via INTRAVENOUS
  Filled 2011-11-15 (×12): qty 50

## 2011-11-15 MED ORDER — SODIUM CHLORIDE 0.45 % IV SOLN
INTRAVENOUS | Status: DC
  Administered 2011-11-15: 13:00:00 via INTRAVENOUS

## 2011-11-15 MED ORDER — VANCOMYCIN HCL IN DEXTROSE 1-5 GM/200ML-% IV SOLN
1000.0000 mg | Freq: Two times a day (BID) | INTRAVENOUS | Status: DC
  Administered 2011-11-15 – 2011-11-16 (×3): 1000 mg via INTRAVENOUS
  Filled 2011-11-15 (×5): qty 200

## 2011-11-15 MED ORDER — SODIUM CHLORIDE 0.9 % IV SOLN
Freq: Once | INTRAVENOUS | Status: AC
  Administered 2011-11-15: 08:00:00 via INTRAVENOUS

## 2011-11-15 NOTE — Progress Notes (Signed)
ANTIBIOTIC CONSULT NOTE - INITIAL  Pharmacy Consult for Vancomycin Indication: Pneumonia  Allergies  Allergen Reactions  . Oxybutynin Chloride     Tongue swelling; Rx from Dr Dannette Barbara  . Propoxyphene N-Acetaminophen     REACTION: made pt blood to thin  . Alendronate Sodium   . Ramipril     REACTION: cough    Patient Measurements: Height: 5' 5.5" (166.4 cm) Weight: 168 lb 10.4 oz (76.5 kg) IBW/kg (Calculated) : 58.15   Vital Signs: Temp: 100 F (37.8 C) (12/24 0740) Temp src: Oral (12/24 0740) BP: 127/69 mmHg (12/24 0740) Pulse Rate: 154  (12/24 0740) Intake/Output from previous day: 12/23 0701 - 12/24 0700 In: 2870 [P.O.:720; I.V.:2000; IV Piggyback:150] Out: 3725 [Urine:3700; Blood:25] Intake/Output from this shift:    Labs:  Basename 11/15/11 0514 11/14/11 0540 11/13/11 0700  WBC 8.4 8.7 9.4  HGB 11.0* 10.9* 10.7*  PLT 223 181 154  LABCREA -- -- --  CREATININE 0.70 0.70 0.69   Estimated Creatinine Clearance: 64.8 ml/min (by C-G formula based on Cr of 0.7). No results found for this basename: VANCOTROUGH:2,VANCOPEAK:2,VANCORANDOM:2,GENTTROUGH:2,GENTPEAK:2,GENTRANDOM:2,TOBRATROUGH:2,TOBRAPEAK:2,TOBRARND:2,AMIKACINPEAK:2,AMIKACINTROU:2,AMIKACIN:2, in the last 72 hours   Microbiology: Recent Results (from the past 720 hour(s))  URINE CULTURE     Status: Normal   Collection Time   11/13/11  4:00 AM      Component Value Range Status Comment   Specimen Description URINE, CLEAN CATCH   Final    Special Requests NONE   Final    Setup Time [id number]   Final    Colony Count NO GROWTH   Final    Culture NO GROWTH   Final    Report Status 11/14/2011 FINAL   Final   CULTURE, BLOOD (ROUTINE X 2)     Status: Normal (Preliminary result)   Collection Time   11/13/11  7:00 AM      Component Value Range Status Comment   Specimen Description BLOOD LEFT ARM   Final    Special Requests Normal BOTTLES DRAWN AEROBIC AND ANAEROBIC 5CC   Final    Setup Time [id number]    Final    Culture     Final    Value:        BLOOD CULTURE RECEIVED NO GROWTH TO DATE CULTURE WILL BE HELD FOR 5 DAYS BEFORE ISSUING A FINAL NEGATIVE REPORT   Report Status PENDING   Incomplete   CULTURE, BLOOD (ROUTINE X 2)     Status: Normal (Preliminary result)   Collection Time   11/13/11  7:12 AM      Component Value Range Status Comment   Specimen Description BLOOD LEFT HAND   Final    Special Requests Normal BOTTLES DRAWN AEROBIC AND ANAEROBIC 5CC   Final    Setup Time [id number]   Final    Culture     Final    Value:        BLOOD CULTURE RECEIVED NO GROWTH TO DATE CULTURE WILL BE HELD FOR 5 DAYS BEFORE ISSUING A FINAL NEGATIVE REPORT   Report Status PENDING   Incomplete     Medical History: Past Medical History  Diagnosis Date  . Diverticulitis 2002    based on CT scan  . Hyperlipidemia   . Hypertension   . Superficial phlebitis     x2, 1999, 2000  . Ocular hypertension     glaucoma suspect, Dr. Dagoberto Ligas  . Skin cancer   . Endometriosis   . Abnormal menses   . Gastritis 2006    @  Endo  . Hiatal hernia   . Diverticulosis   . GERD (gastroesophageal reflux disease)   . DVT (deep venous thrombosis)     Medications:  Scheduled:    . sodium chloride   Intravenous Once  . aspirin EC  81 mg Oral Daily  . azithromycin  500 mg Intravenous Q24H  . carvedilol  12.5 mg Oral BID WC  . colestipol  1 g Oral BID  . diltiazem  5 mg Intravenous Once  . docusate sodium  100 mg Oral BID  . guaiFENesin  1,200 mg Oral BID  . latanoprost  1 drop Both Eyes QHS  . levofloxacin (LEVAQUIN) IV  750 mg Intravenous Q24H  . loratadine  10 mg Oral Daily  . montelukast  10 mg Oral QHS  . mulitivitamin with minerals  1 tablet Oral Daily  . pantoprazole  40 mg Oral Q1200  . potassium chloride  40 mEq Oral BID  . rivaroxaban  10 mg Oral Daily  . senna  1 tablet Oral BID  . DISCONTD: cefTRIAXone (ROCEPHIN)  IV  1 g Intravenous Q24H  . DISCONTD: fluconazole  100 mg Oral Daily  .  DISCONTD: fluconazole  200 mg Oral Once  . DISCONTD: rivaroxaban  10 mg Oral Daily   Infusions:    . 0.45 % NaCl with KCl 20 mEq / L 75 mL/hr at 11/13/11 0631  . DISCONTD: lactated ringers 125 mL/hr at 11/14/11 1317   PRN: acetaminophen, albuterol-ipratropium, alum & mag hydroxide-simeth, dicyclomine, etomidate, fluticasone, HYDROmorphone, methocarbamol(ROBAXIN) IV, methocarbamol, ondansetron (ZOFRAN) IV, ondansetron, oxyCODONE, zolpidem, DISCONTD: 0.9 % irrigation (POUR BTL), DISCONTD: HYDROmorphone, DISCONTD: meperidine, DISCONTD: promethazine  Assessment: 75 y/o F with CAP, was started on Ceftriaxone /Azithromycin but still spiking fevers.  Antibiotics were changed to Levaquin last night.  Vancomycin now being added empirically.  Goal of Therapy:  Vancomycin trough 15-20  Plan:  Begin Vancomycin 1000mg  IV q12h Check Vancomycin trough at steady-state Await culture results  Elie Goody, PharmD  [phone] 11/15/2011 8:06 AM

## 2011-11-15 NOTE — Progress Notes (Signed)
Orthopedics Progress Note  Subjective: Pt states she feels better today. Mild pain to left leg s/p surgery. Pt currently in a-fib which is being handled by the medical team  Objective:  Filed Vitals:   11/15/11 0740  BP: 127/69  Pulse: 154  Temp: 100 F (37.8 C)  Resp: 20    General: Awake and alert  Musculoskeletal: left lower leg in splint, nv intact distally, pt non weight bearing Neurovascularly intact  Lab Results  Component Value Date   WBC 8.4 11/15/2011   HGB 11.0* 11/15/2011   HCT 32.1* 11/15/2011   MCV 84.7 11/15/2011   PLT 223 11/15/2011       Component Value Date/Time   NA 135 11/15/2011 0514   K 4.1 11/15/2011 0514   CL 99 11/15/2011 0514   CO2 26 11/15/2011 0514   GLUCOSE 92 11/15/2011 0514   BUN 5* 11/15/2011 0514   CREATININE 0.70 11/15/2011 0514   CALCIUM 9.2 11/15/2011 0514   GFRNONAA 84* 11/15/2011 0514   GFRAA >90 11/15/2011 0514    Lab Results  Component Value Date   INR 1.17 11/13/2011   INR 1.10 11/12/2011   INR 1.0 09/30/2008    Assessment/Plan: POD #1 s/p Procedure(s): OPEN REDUCTION INTERNAL FIXATION (ORIF) ANKLE FRACTURE with a-fib and pneumonia   Plan: non weight bearing to left lower leg Pt continues to have work up and treatment for pneumonia and a-fib. Will continue to monitor progress PT/OT when medically stable to undergo  Almedia Balls. Ranell Patrick, MD 11/15/2011 8:33 AM

## 2011-11-15 NOTE — Progress Notes (Signed)
Appreciate cardiology and ortho input. Patient went into afib with rvr this morning, she is back in sinus. She also developed fever. Have broadened abx.  SUBJECTIVE Anxious.   1. Syncope   2. Community acquired pneumonia   3. Ankle fracture, bimalleolar, closed   4. Diarrhea   5. Atrial fibrillation     Past Medical History  Diagnosis Date  . Diverticulitis 2002    based on CT scan  . Hyperlipidemia   . Hypertension   . Superficial phlebitis     x2, 1999, 2000  . Ocular hypertension     glaucoma suspect, Dr. Dagoberto Ligas  . Skin cancer   . Endometriosis   . Gastritis 2006    @ [facility]  . Hiatal hernia   . Diverticulosis   . GERD (gastroesophageal reflux disease)   . DVT (deep venous thrombosis)    Current Facility-Administered Medications  Medication Dose Route Frequency Provider Last Rate Last Dose  . 0.45 % sodium chloride infusion   Intravenous Continuous Jazyiah Yiu 75 mL/hr at 11/15/11 1239    . 0.9 %  sodium chloride infusion   Intravenous Once Kalissa Grays 500 mL/hr at 11/15/11 0745    . acetaminophen (TYLENOL) tablet 650 mg  650 mg Oral Q4H PRN Rolan Lipa   650 mg at 11/15/11 0454  . albuterol-ipratropium (COMBIVENT) inhaler 1-2 puff  1-2 puff Inhalation Q4H PRN Harly Pipkins      . ALPRAZolam (XANAX) tablet 0.25 mg  0.25 mg Oral BID PRN Franciscojavier Wronski   0.25 mg at 11/15/11 1239  . alum & mag hydroxide-simeth (MAALOX/MYLANTA) 200-200-20 MG/5ML suspension 30 mL  30 mL Oral Q6H PRN Daveda Larock      . aspirin EC tablet 81 mg  81 mg Oral Daily Riyanshi Wahab   81 mg at 11/15/11 0948  . carvedilol (COREG) tablet 18.75 mg  18.75 mg Oral BID WC Lewayne Bunting, MD   18.75 mg at 11/15/11 1732  . colestipol (COLESTID) tablet 1 g  1 g Oral BID Goldie Dimmer   1 g at 11/15/11 0948  . dicyclomine (BENTYL) capsule 10 mg  10 mg Oral Q6H PRN Latonda Larrivee      . diltiazem (CARDIZEM) injection 5 mg  5 mg Intravenous Once Geral Tuch   5 mg at 11/15/11 0804  .  diltiazem (CARDIZEM) injection 5 mg  5 mg Intravenous Once Moishe Schellenberg      . docusate sodium (COLACE) capsule 100 mg  100 mg Oral BID Beckey Polkowski   100 mg at 11/15/11 0948  . etomidate (AMIDATE) injection   Intravenous PRN Hilario Quarry, MD   12 mg at 11/12/11 0981  . fluticasone (FLONASE) 50 MCG/ACT nasal spray 1 spray  1 spray Each Nare BID PRN Eurydice Calixto      . guaiFENesin (MUCINEX) 12 hr tablet 1,200 mg  1,200 mg Oral BID Rosanne Wohlfarth   1,200 mg at 11/15/11 0948  . HYDROmorphone (DILAUDID) injection 1 mg  1 mg Intravenous Q4H PRN Milik Gilreath   1 mg at 11/15/11 1124  . latanoprost (XALATAN) 0.005 % ophthalmic solution 1 drop  1 drop Both Eyes QHS Dalton Molesworth   1 drop at 11/14/11 2238  . Levofloxacin (LEVAQUIN) IVPB 750 mg  750 mg Intravenous Q24H Samiyah Stupka   750 mg at 11/14/11 2036  . loratadine (CLARITIN) tablet 10 mg  10 mg Oral Daily Chrisean Kloth   10 mg at 11/15/11 0948  . methocarbamol (ROBAXIN) 500 mg in  dextrose 5 % 50 mL IVPB  500 mg Intravenous Q8H PRN Kirtland Bouchard, PA      . methocarbamol (ROBAXIN) tablet 500 mg  500 mg Oral Q8H PRN Kirtland Bouchard, PA   500 mg at 11/15/11 1731  . montelukast (SINGULAIR) tablet 10 mg  10 mg Oral QHS Donterius Filley   10 mg at 11/14/11 2238  . mulitivitamin with minerals tablet 1 tablet  1 tablet Oral Daily Enrico Eaddy   1 tablet at 11/15/11 0947  . ondansetron (ZOFRAN) tablet 4 mg  4 mg Oral Q6H PRN Jaiceon Collister       Or  . ondansetron (ZOFRAN) injection 4 mg  4 mg Intravenous Q6H PRN Jahking Lesser   4 mg at 11/12/11 2306  . oxyCODONE (Oxy IR/ROXICODONE) immediate release tablet 5 mg  5 mg Oral Q4H PRN Darlean Warmoth   5 mg at 11/15/11 1732  . pantoprazole (PROTONIX) EC tablet 40 mg  40 mg Oral Q1200 Brance Dartt   40 mg at 11/15/11 1116  . piperacillin-tazobactam (ZOSYN) IVPB 3.375 g  3.375 g Intravenous Q8H Leonela Kivi   3.375 g at 11/15/11 1732  . potassium chloride SA (K-DUR,KLOR-CON) CR tablet 40 mEq  40 mEq Oral BID  Kelyn Ponciano   40 mEq at 11/15/11 0947  . rivaroxaban (XARELTO) tablet 10 mg  10 mg Oral Daily Kirtland Bouchard, PA   10 mg at 11/15/11 0948  . senna (SENOKOT) tablet 8.6 mg  1 tablet Oral BID Euell Schiff   8.6 mg at 11/14/11 2237  . vancomycin (VANCOCIN) IVPB 1000 mg/200 mL premix  1,000 mg Intravenous Q12H Randall K Absher, PHARMD   1,000 mg at 11/15/11 0948  . zolpidem (AMBIEN) tablet 5 mg  5 mg Oral QHS PRN Jaeden Westbay      . DISCONTD: carvedilol (COREG) tablet 12.5 mg  12.5 mg Oral BID WC Dann Galicia   12.5 mg at 11/15/11 0806  . DISCONTD: cefTRIAXone (ROCEPHIN) 1 g in dextrose 5 % 50 mL IVPB  1 g Intravenous Q24H Deiontae Rabel   1 g at 11/13/11 1305   Allergies  Allergen Reactions  . Oxybutynin Chloride     Tongue swelling; Rx from Dr Dannette Barbara  . Propoxyphene N-Acetaminophen     REACTION: made pt blood to thin  . Alendronate Sodium   . Ramipril     REACTION: cough   Principal Problem:  *Pneumonia Active Problems:  Ankle fracture, left  Syncope  Hypokalemia  RBBB (right bundle branch block)  HTN (hypertension), malignant  GERD (gastroesophageal reflux disease)  Atrial fibrillation   Vital signs in last 24 hours: Temp:  [98.4 F (36.9 C)-102.5 F (39.2 C)] 98.9 F (37.2 C) (12/24 1400) Pulse Rate:  [78-154] 78  (12/24 1400) Resp:  [19-20] 19  (12/24 1400) BP: (98-167)/(61-94) 98/61 mmHg (12/24 1400) SpO2:  [90 %-94 %] 93 % (12/24 1400) Weight change:  Last BM Date: 11/12/11  Intake/Output from previous day: 12/23 0701 - 12/24 0700 In: 2870 [P.O.:720; I.V.:2000; IV Piggyback:150] Out: 3725 [Urine:3700; Blood:25] Intake/Output this shift:    Lab Results:  Basename 11/15/11 0514 11/14/11 0540  WBC 8.4 8.7  HGB 11.0* 10.9*  HCT 32.1* 32.4*  PLT 223 181   BMET  Basename 11/15/11 0514 11/14/11 0540  NA 135 138  K 4.1 4.1  CL 99 106  CO2 26 26  GLUCOSE 92 104*  BUN 5* 7  CREATININE 0.70 0.70  CALCIUM 9.2 9.1    Studies/Results:  Dg Chest Port 1  View  11/15/2011  *RADIOLOGY REPORT*  Clinical Data: Cough, asthma, fever  PORTABLE CHEST - 1 VIEW  Comparison: 11/12/2011  Findings: Interval worsening of focal airspace consolidation in the left upper lobe.  Patchy infiltrate or atelectasis at the left lung base is now evident.  Right lung remains clear.  Heart size upper limits normal.  No definite effusion.  IMPRESSION:  1.  Worsening left lung airspace disease.  Original Report Authenticated By: Osa Craver, M.D.    Medications: I have reviewed the patient's current medications.   Physical exam GENERAL- alert  HEAD- normal atraumatic, no neck masses, normal thyroid, no jvd  RESPIRATORY- some cough. Now wheezing.  CVS- regular rate and rhythm, S1, S2 normal, no murmur, click, rub or gallop  ABDOMEN- abdomen is soft without significant tenderness, masses, organomegaly or guarding  NEURO- Grossly normal  EXTREMITIES- s/p left ankle surgery, no overt bleeding. Foot cold.      Plan   .Pneumonia- some worsening, may have aspirated. Broaden abx coverage fort now till cultures back. .AFIB with rvr, new- cardiology appreciated. Back in sinus. Marland KitchenHTN (hypertension), malignant- BP ok. Continue coreg. Marland KitchenGERD (gastroesophageal reflux disease)- on ppi. Marland KitchenAnkle fracture, left-s/p orif. Appreciate ortho input. For snf.  Condition guarded. Discussed plan of care with daughter and husband.     Matika Bartell 11/15/2011 7:06 PM Pager: [phone].

## 2011-11-15 NOTE — Progress Notes (Signed)
CSW spoke with patient's husband, Chrissie Noa (cell#: [phone] home#: [phone]) re: SNF. Patient had not been to SNF in the past but would really prefer to go to [facility]. CSW completed FL2, faxed out to [facility] and called Janie @ [facility] to give her a heads up. Anticipating discharge Wednesday or Thursday.   Unice Bailey, [place] [phone]  See shadow chart for yellow clinical social work assessment & placement note.

## 2011-11-15 NOTE — Consults (Signed)
HPI: 75 year old female for evaluation of atrial fibrillation. No prior cardiac history. Approximately 2 weeks ago the patient developed upper respiratory symptoms. She described sneezing, cough and then developed fevers and chills. One week ago after having poor by mouth intake and diarrhea she walked to the kitchen to get water. Her husband found her approximately 30-45 minutes later moaning on the ground. She has no recollection of the events. She is unclear if she had syncope. She had no preceding palpitations, chest pain or dyspnea. She did suffer an ankle fracture. She had open reduction and fixation of her left ankle fracture on December 23. The patient developed atrial fibrillation today and cardiology was asked to evaluate. She denies dyspnea, chest pain, palpitations. Her URI symptoms are improving.  Medications Prior to Admission  Medication Dose Route Frequency Provider Last Rate Last Dose  . 0.45 % NaCl with KCl 20 mEq / L infusion   Intravenous Continuous Simbiso Ranga 75 mL/hr at 11/13/11 0631    . 0.45 % sodium chloride infusion   Intravenous Continuous Simbiso Ranga 75 mL/hr at 11/15/11 1239    . 0.9 %  sodium chloride infusion   Intravenous Once Simbiso Ranga 500 mL/hr at 11/15/11 0745    . acetaminophen (TYLENOL) tablet 650 mg  650 mg Oral Q4H PRN Rolan Lipa   650 mg at 11/15/11 1610  . albuterol-ipratropium (COMBIVENT) inhaler 1-2 puff  1-2 puff Inhalation Q4H PRN Simbiso Ranga      . ALPRAZolam (XANAX) tablet 0.25 mg  0.25 mg Oral BID PRN Simbiso Ranga   0.25 mg at 11/15/11 1239  . alum & mag hydroxide-simeth (MAALOX/MYLANTA) 200-200-20 MG/5ML suspension 30 mL  30 mL Oral Q6H PRN Simbiso Ranga      . aspirin EC tablet 81 mg  81 mg Oral Daily Simbiso Ranga   81 mg at 11/15/11 0948  . azithromycin (ZITHROMAX) 500 mg in dextrose 5 % 250 mL IVPB  500 mg Intravenous Once Hilario Quarry, MD   500 mg at 11/12/11 1005  . azithromycin (ZITHROMAX) 500 mg in dextrose 5 % 250 mL  IVPB  500 mg Intravenous Q24H Simbiso Ranga   500 mg at 11/14/11 1102  . carvedilol (COREG) tablet 12.5 mg  12.5 mg Oral BID WC Simbiso Ranga   12.5 mg at 11/15/11 0806  . cefTRIAXone (ROCEPHIN) 1 g in dextrose 5 % 50 mL IVPB  1 g Intravenous Once Hilario Quarry, MD   1 g at 11/12/11 1342  . colestipol (COLESTID) tablet 1 g  1 g Oral BID Simbiso Ranga   1 g at 11/15/11 0948  . dicyclomine (BENTYL) capsule 10 mg  10 mg Oral Q6H PRN Simbiso Ranga      . diltiazem (CARDIZEM) injection 5 mg  5 mg Intravenous Once Simbiso Ranga   5 mg at 11/15/11 0804  . diltiazem (CARDIZEM) injection 5 mg  5 mg Intravenous Once Simbiso Ranga      . docusate sodium (COLACE) capsule 100 mg  100 mg Oral BID Simbiso Ranga   100 mg at 11/15/11 0948  . etomidate (AMIDATE) injection   Intravenous PRN Hilario Quarry, MD   12 mg at 11/12/11 9604  . fluticasone (FLONASE) 50 MCG/ACT nasal spray 1 spray  1 spray Each Nare BID PRN Simbiso Ranga      . guaiFENesin (MUCINEX) 12 hr tablet 1,200 mg  1,200 mg Oral BID Simbiso Ranga   1,200 mg at 11/15/11 0948  . HYDROmorphone (DILAUDID) injection 1  mg  1 mg Intravenous Once Hilario Quarry, MD   1 mg at 11/12/11 0757  . HYDROmorphone (DILAUDID) injection 1 mg  1 mg Intravenous Once Hilario Quarry, MD   1 mg at 11/12/11 1005  . HYDROmorphone (DILAUDID) injection 1 mg  1 mg Intravenous Q4H PRN Simbiso Ranga   1 mg at 11/15/11 1124  . HYDROmorphone (DILAUDID) injection 1 mg  1 mg Intravenous NOW Almedia Balls Norris   1 mg at 11/13/11 1045  . latanoprost (XALATAN) 0.005 % ophthalmic solution 1 drop  1 drop Both Eyes QHS Simbiso Ranga   1 drop at 11/14/11 2238  . Levofloxacin (LEVAQUIN) IVPB 750 mg  750 mg Intravenous Q24H Simbiso Ranga   750 mg at 11/14/11 2036  . loratadine (CLARITIN) tablet 10 mg  10 mg Oral Daily Simbiso Ranga   10 mg at 11/15/11 0948  . magnesium sulfate IVPB 2 g 50 mL  2 g Intravenous Once Simbiso Ranga   2 g at 11/13/11 1529  . methocarbamol (ROBAXIN) 500 mg in dextrose 5  % 50 mL IVPB  500 mg Intravenous Q8H PRN Kirtland Bouchard, PA      . methocarbamol (ROBAXIN) tablet 500 mg  500 mg Oral Q8H PRN Kirtland Bouchard, PA   500 mg at 11/15/11 0947  . metoCLOPramide (REGLAN) injection 10 mg  10 mg Intravenous Once Simbiso Ranga   10 mg at 11/12/11 1205  . montelukast (SINGULAIR) tablet 10 mg  10 mg Oral QHS Simbiso Ranga   10 mg at 11/14/11 2238  . mulitivitamin with minerals tablet 1 tablet  1 tablet Oral Daily Simbiso Ranga   1 tablet at 11/15/11 0947  . ondansetron (ZOFRAN) tablet 4 mg  4 mg Oral Q6H PRN Simbiso Ranga       Or  . ondansetron (ZOFRAN) injection 4 mg  4 mg Intravenous Q6H PRN Simbiso Ranga   4 mg at 11/12/11 2306  . ondansetron (ZOFRAN-ODT) disintegrating tablet 4 mg  4 mg Oral Once Hilario Quarry, MD   4 mg at 11/12/11 0755  . oxyCODONE (Oxy IR/ROXICODONE) immediate release tablet 5 mg  5 mg Oral Q4H PRN Simbiso Ranga   5 mg at 11/15/11 0948  . pantoprazole (PROTONIX) EC tablet 40 mg  40 mg Oral Q1200 Simbiso Ranga   40 mg at 11/15/11 1116  . piperacillin-tazobactam (ZOSYN) IVPB 3.375 g  3.375 g Intravenous Q8H Simbiso Ranga   3.375 g at 11/15/11 1115  . potassium chloride SA (K-DUR,KLOR-CON) CR tablet 40 mEq  40 mEq Oral Once Hilario Quarry, MD   40 mEq at 11/12/11 0951  . potassium chloride SA (K-DUR,KLOR-CON) CR tablet 40 mEq  40 mEq Oral BID Simbiso Ranga   40 mEq at 11/15/11 0947  . rivaroxaban (XARELTO) tablet 10 mg  10 mg Oral Daily Kirtland Bouchard, PA   10 mg at 11/15/11 0948  . senna (SENOKOT) tablet 8.6 mg  1 tablet Oral BID Simbiso Ranga   8.6 mg at 11/14/11 2237  . sodium chloride 0.9 % bolus 500 mL  500 mL Intravenous Once Hilario Quarry, MD   1,000 mL at 11/12/11 0757  . sodium chloride 0.9 % bolus 500 mL  500 mL Intravenous Once Ali Lowe Callahan   500 mL at 11/13/11 0438  . vancomycin (VANCOCIN) IVPB 1000 mg/200 mL premix  1,000 mg Intravenous Q12H Randall K Absher, PHARMD   1,000 mg at 11/15/11 0948  . zolpidem (AMBIEN) tablet  5 mg  5 mg Oral QHS PRN Simbiso Ranga      . DISCONTD: 0.9 % irrigation (POUR BTL)    PRN Worthy Rancher Bednarz   1,000 mL at 11/14/11 1124  . DISCONTD: cefTRIAXone (ROCEPHIN) 1 g in dextrose 5 % 50 mL IVPB  1 g Intravenous Q24H Simbiso Ranga   1 g at 11/13/11 1305  . DISCONTD: enoxaparin (LOVENOX) injection 40 mg  40 mg Subcutaneous Q24H Simbiso Ranga   40 mg at 11/12/11 2153  . DISCONTD: etomidate (AMIDATE) injection 20 mg  20 mg Intravenous Once Hilario Quarry, MD      . DISCONTD: fluconazole (DIFLUCAN) tablet 100 mg  100 mg Oral Daily Simbiso Ranga      . DISCONTD: fluconazole (DIFLUCAN) tablet 200 mg  200 mg Oral Once Simbiso Ranga      . DISCONTD: HYDROmorphone (DILAUDID) injection 0.25-0.5 mg  0.25-0.5 mg Intravenous Q5 min PRN Phillips Grout, MD      . DISCONTD: lactated ringers infusion   Intravenous Continuous Phillips Grout, MD 125 mL/hr at 11/14/11 1317    . DISCONTD: losartan (COZAAR) tablet 100 mg  100 mg Oral Daily Simbiso Ranga   100 mg at 11/12/11 2152  . DISCONTD: meperidine (DEMEROL) injection 6.25-12.5 mg  6.25-12.5 mg Intravenous Q5 min PRN Phillips Grout, MD      . DISCONTD: promethazine (PHENERGAN) injection 6.25-12.5 mg  6.25-12.5 mg Intravenous Q15 min PRN Phillips Grout, MD      . DISCONTD: rivaroxaban (XARELTO) tablet 10 mg  10 mg Oral Daily Kirtland Bouchard, PA       Medications Prior to Admission  Medication Sig Dispense Refill  . albuterol-ipratropium (COMBIVENT) 18-103 MCG/ACT inhaler Inhale 1 puff into the lungs as directed. 1-2 puffs every 4-6hrs prn cough/SOB  14.7 g  11  . carvedilol (COREG) 12.5 MG tablet TAKE ONE TABLET BY MOUTH TWICE DAILY  60 tablet  5  . colestipol (COLESTID) 1 G tablet Take 1 tablet (1 g total) by mouth 2 (two) times daily.  60 tablet  3  . dicyclomine (BENTYL) 10 MG capsule Take 10 mg by mouth every 6 (six) hours as needed.        . fexofenadine (ALLEGRA) 180 MG tablet Take 180 mg by mouth as needed.        . fluticasone (FLONASE) 50 MCG/ACT  nasal spray Place 1 spray into the nose 2 (two) times daily as needed.       . latanoprost (XALATAN) 0.005 % ophthalmic solution Place 1 drop into both eyes daily.        Marland Kitchen losartan (COZAAR) 100 MG tablet Take 100 mg by mouth daily.        . montelukast (SINGULAIR) 10 MG tablet Take 1 tablet (10 mg total) by mouth at bedtime.  90 tablet  1  . omeprazole (PRILOSEC) 20 MG capsule Take 1 capsule (20 mg total) by mouth daily.  90 capsule  1  . furosemide (LASIX) 20 MG tablet Take 20 mg by mouth 2 (two) times daily.         Allergies  Allergen Reactions  . Oxybutynin Chloride     Tongue swelling; Rx from Dr Dannette Barbara  . Propoxyphene N-Acetaminophen     REACTION: made pt blood to thin  . Alendronate Sodium   . Ramipril     REACTION: cough    Past Medical History  Diagnosis Date  . Diverticulitis 2002    based on CT scan  . Hyperlipidemia   .  Hypertension   . Superficial phlebitis     x2, 1999, 2000  . Ocular hypertension     glaucoma suspect, Dr. Dagoberto Ligas  . Skin cancer   . Endometriosis   . Gastritis 2006    @ [facility]  . Hiatal hernia   . Diverticulosis   . GERD (gastroesophageal reflux disease)   . DVT (deep venous thrombosis)     Past Surgical History  Procedure Date  . Total abdominal hysterectomy w/ bilateral salpingoophorectomy 1980    dysfunctional menses, endometriosis  . Varicose vein surgery     1960, 1965, 2009  . Tonsillectomy   . Cataract extraction w/ intraocular lens implant     OS  . Colonoscopy 1995, 2002    diverticulosis    History   Social History  . Marital Status: Married    Spouse Name: N/A    Number of Children: 2  . Years of Education: N/A   Occupational History  . Retired    Social History Main Topics  . Smoking status: Never Smoker   . Smokeless tobacco: Never Used  . Alcohol Use: No  . Drug Use: No  . Sexually Active: Not on file   Other Topics Concern  . Not on file   Social History Narrative   REG EXERCISEHAD COLONOSCOPY  AND ENDOSCOPY DONE DATES UNKNOWN    Family History  Problem Relation Age of Onset  . Heart attack Father 67  . Heart disease Father 33    MI   . Alzheimer's disease Mother     TIAs  . Mental illness Mother     ALSHEIMERS  . Hyperlipidemia Brother   . Hypertension Brother   . Diverticulitis Daughter     S/P colectomy  . Colon cancer Maternal Grandmother   . Cancer Maternal Grandmother     COLON  . Heart attack Paternal Grandmother 82    ROS:  URI symptoms and left ankle pain; recent hematocheezia that she attributes to diverticulosis; no hemoptysis, dysphasia, odynophagia, melena, dysuria, hematuria, rash, seizure activity, orthopnea, PND, pedal edema, claudication. Remaining systems are negative.  Physical Exam:   Blood pressure 127/69, pulse 154, temperature 100 F (37.8 C), temperature source Oral, resp. rate 20, height 5' 5.5" (1.664 m), weight 168 lb 10.4 oz (76.5 kg), SpO2 94.00%.  General:  Well developed/well nourished in NAD Skin warm/dry Patient not depressed No peripheral clubbing Back-normal HEENT-normal/normal eyelids Neck supple/normal carotid upstroke bilaterally; no bruits; no JVD; no thyromegaly chest - CTA/ normal expansion CV - RRR/normal S1 and S2; no rubs or gallops;  PMI nondisplaced; 2/6 systolic murmur apex Abdomen -NT/ND, no HSM, no mass, + bowel sounds, no bruit 2+ femoral pulses, no bruits Ext-no edema, chords, 2+ DP on the right; left lower ext in cast Neuro-grossly nonfocal  ECG Atrial flutter with RVR; RBBB  Echo - - Left ventricle: The cavity size was normal. Wall thickness was normal. Systolic function was normal. The estimated ejection fraction was in the range of 60% to 65%. Wall motion was normal; there were no regional wall motion abnormalities. Doppler parameters are consistent with abnormal left ventricular relaxation (grade 1 diastolic dysfunction). - Mitral valve: Calcified annulus. - Left atrium: The atrium was mildly  dilated. - Right ventricle: The cavity size was mildly dilated. - Right atrium: The atrium was mildly to moderately dilated. - Pulmonary arteries: Systolic pressure was moderately increased. PA peak pressure: 52mm Hg (S).    Results for orders placed during the hospital encounter of 11/12/11 (from  the past 48 hour(s))  CBC     Status: Abnormal   Collection Time   11/14/11  5:40 AM      Component Value Range Comment   WBC 8.7  4.0 - 10.5 (K/uL)    RBC 3.83 (*) 3.87 - 5.11 (MIL/uL)    Hemoglobin 10.9 (*) 12.0 - 15.0 (g/dL)    HCT 16.1 (*) 09.6 - 46.0 (%)    MCV 84.6  78.0 - 100.0 (fL)    MCH 28.5  26.0 - 34.0 (pg)    MCHC 33.6  30.0 - 36.0 (g/dL)    RDW 04.5  40.9 - 81.1 (%)    Platelets 181  150 - 400 (K/uL)   BASIC METABOLIC PANEL     Status: Abnormal   Collection Time   11/14/11  5:40 AM      Component Value Range Comment   Sodium 138  135 - 145 (mEq/L)    Potassium 4.1  3.5 - 5.1 (mEq/L)    Chloride 106  96 - 112 (mEq/L)    CO2 26  19 - 32 (mEq/L)    Glucose, Bld 104 (*) 70 - 99 (mg/dL)    BUN 7  6 - 23 (mg/dL)    Creatinine, Ser 9.14  0.50 - 1.10 (mg/dL)    Calcium 9.1  8.4 - 10.5 (mg/dL)    GFR calc non Af Amer 84 (*) >90 (mL/min)    GFR calc Af Amer >90  >90 (mL/min)   CBC     Status: Abnormal   Collection Time   11/15/11  5:14 AM      Component Value Range Comment   WBC 8.4  4.0 - 10.5 (K/uL)    RBC 3.79 (*) 3.87 - 5.11 (MIL/uL)    Hemoglobin 11.0 (*) 12.0 - 15.0 (g/dL)    HCT 78.2 (*) 95.6 - 46.0 (%)    MCV 84.7  78.0 - 100.0 (fL)    MCH 29.0  26.0 - 34.0 (pg)    MCHC 34.3  30.0 - 36.0 (g/dL)    RDW 21.3  08.6 - 57.8 (%)    Platelets 223  150 - 400 (K/uL)   COMPREHENSIVE METABOLIC PANEL     Status: Abnormal   Collection Time   11/15/11  5:14 AM      Component Value Range Comment   Sodium 135  135 - 145 (mEq/L)    Potassium 4.1  3.5 - 5.1 (mEq/L)    Chloride 99  96 - 112 (mEq/L)    CO2 26  19 - 32 (mEq/L)    Glucose, Bld 92  70 - 99 (mg/dL)    BUN 5 (*)  6 - 23 (mg/dL)    Creatinine, Ser 4.69  0.50 - 1.10 (mg/dL)    Calcium 9.2  8.4 - 10.5 (mg/dL)    Total Protein 6.0  6.0 - 8.3 (g/dL)    Albumin 2.5 (*) 3.5 - 5.2 (g/dL)    AST 19  0 - 37 (U/L)    ALT 12  0 - 35 (U/L)    Alkaline Phosphatase 80  39 - 117 (U/L)    Total Bilirubin 0.6  0.3 - 1.2 (mg/dL)    GFR calc non Af Amer 84 (*) >90 (mL/min)    GFR calc Af Amer >90  >90 (mL/min)   CARDIAC PANEL(CRET KIN+CKTOT+MB+TROPI)     Status: Normal   Collection Time   11/15/11  8:45 AM      Component Value Range Comment  Total CK 128  7 - 177 (U/L)    CK, MB 3.0  0.3 - 4.0 (ng/mL)    Troponin I <0.30  <0.30 (ng/mL)    Relative Index 2.3  0.0 - 2.5      Dg Chest Port 1 View  11/15/2011  *RADIOLOGY REPORT*  Clinical Data: Cough, asthma, fever  PORTABLE CHEST - 1 VIEW  Comparison: 11/12/2011  Findings: Interval worsening of focal airspace consolidation in the left upper lobe.  Patchy infiltrate or atelectasis at the left lung base is now evident.  Right lung remains clear.  Heart size upper limits normal.  No definite effusion.  IMPRESSION:  1.  Worsening left lung airspace disease.  Original Report Authenticated By: Osa Craver, M.D.    Assessment/Plan #1-atrial fibrillation-the patient most likely has postoperative atrial fibrillation from her recent repair of ankle fracture. She is back in sinus rhythm. Her echo shows normal LV function. Recent TSH normal. I will increase Coreg to 18.75 mg by mouth twice a day. She is on xeralto at DVT prophylaxis doses. I do not think she needs therapeutic doses at this point unless her atrial fibrillation becomes recurrent. After discharge I will see her back in 6-8 weeks. If she remains in sinus then she will not require further cardiac evaluation. #2-pneumonia-continue antibiotics per primary care. #3-status post repair of ankle fracture-management per orthopedics. #4-hypertension-continue to monitor and increase medications as needed.   Olga Millers MD 11/15/2011, 1:45 PM

## 2011-11-15 NOTE — Progress Notes (Signed)
UR completed 

## 2011-11-16 ENCOUNTER — Other Ambulatory Visit: Payer: Self-pay

## 2011-11-16 DIAGNOSIS — R55 Syncope and collapse: Secondary | ICD-10-CM

## 2011-11-16 LAB — URINE CULTURE
Colony Count: NO GROWTH
Culture  Setup Time: 201212241225

## 2011-11-16 NOTE — Progress Notes (Signed)
Subjective: 2 Days Post-Op Procedure(s) (LRB): OPEN REDUCTION INTERNAL FIXATION (ORIF) ANKLE FRACTURE (Left) Patient reports pain as mild.    Objective: Vital signs in last 24 hours: Temp:  [98.6 F (37 C)-99.5 F (37.5 C)] 98.6 F (37 C) (12/25 0502) Pulse Rate:  [73-78] 73  (12/25 0502) Resp:  [18-19] 18  (12/25 0502) BP: (98-110)/(61-67) 110/65 mmHg (12/25 0502) SpO2:  [92 %-93 %] 93 % (12/25 0502)  Intake/Output from previous day: 12/24 0701 - 12/25 0700 In: 1970 [P.O.:720; I.V.:750; IV Piggyback:500] Out: 3950 [Urine:3950] Intake/Output this shift:     Basename 11/15/11 0514 11/14/11 0540  HGB 11.0* 10.9*    Basename 11/15/11 0514 11/14/11 0540  WBC 8.4 8.7  RBC 3.79* 3.83*  HCT 32.1* 32.4*  PLT 223 181    Basename 11/15/11 0514 11/14/11 0540  NA 135 138  K 4.1 4.1  CL 99 106  CO2 26 26  BUN 5* 7  CREATININE 0.70 0.70  GLUCOSE 92 104*  CALCIUM 9.2 9.1   No results found for this basename: LABPT:2,INR:2 in the last 72 hours  Neurologically intact Dressing clean, dry, intact. Passive and active ROM toes no discomfort   Assessment/Plan: 2 Days Post-Op Procedure(s) (LRB): OPEN REDUCTION INTERNAL FIXATION (ORIF) ANKLE FRACTURE (Left) Discharge to SNF when medically stable and bed available F/U Dr Lestine Box in 2 weeks call [phone] for appointment Elevation above level of heart and wiggle toes periodically throughout the day Xarelto DVT prophylaxis x 6 weeks while non weight bearing Keep splint clean dry intact PT/OT Gait training and ADLs for non weight bearing Please call if any questions.  Valerie Rollins A 11/16/2011, 10:29 AM

## 2011-11-16 NOTE — Progress Notes (Signed)
Valerie Rollins is a pleasant  75 y.o. female admitted after she passed out in the night at home. Upon admission she was found to have left sided community acquired p[place] wbc [place] [address]. She was also found to have fracture dislocation of left ankle. Patient had workup for syncope, including ct brain/carotid duplex/2decho, which were unremarkable. She likely had orthostaitic hypotension leading to fall. Telemetry picked up afib on 11/14/11, prompting cardiology input. Appreciate ortho and cards input. Patient converted back to sinus and she is to continue coreg, long term anticoagulation not recommended by cardiology as afib felt to be transient and related to ongoing medical/surgical issues including fever. Patient had orif on 11/14/11 by Dr Lestine Box, and she should follow with him outpatient in 2 weeks. She developed post op fever, prompting broadening of abx coverage. CXR suggested worsening of left sided pna. Chances are patient aspirated as this happened same day after surgery. She has done ok on vanc/zosyn/levaquin, but will start deescalating abx today- d/c vanc.   SUBJECTIVE Feels ok, left ankle pain better, less cough.   1. Syncope   2. Community acquired pneumonia   3. Ankle fracture, bimalleolar, closed   4. Diarrhea   5. Atrial fibrillation     Past Medical History  Diagnosis Date  . Diverticulitis 2002    based on CT scan  . Hyperlipidemia   . Hypertension   . Superficial phlebitis     x2, 1999, 2000  . Ocular hypertension     glaucoma suspect, Dr. Dagoberto Ligas  . Skin cancer   . Endometriosis   . Gastritis 2006    @ End[facility]  . Hiatal hernia   . Diverticulosis   . GERD (gastroesophageal reflux disease)   . DVT (deep venous thrombosis)    Current Facility-Administered Medications  Medication Dose Route Frequency Provider Last Rate Last Dose  . acetaminophen (TYLENOL) tablet 650 mg  650 mg Oral Q4H PRN Rolan Lipa   650 mg at 11/16/11 1036  . albuterol-ipratropium  (COMBIVENT) inhaler 1-2 puff  1-2 puff Inhalation Q4H PRN Antanasia Kaczynski      . ALPRAZolam (XANAX) tablet 0.25 mg  0.25 mg Oral BID PRN Lainy Wrobleski   0.25 mg at 11/15/11 1239  . alum & mag hydroxide-simeth (MAALOX/MYLANTA) 200-200-20 MG/5ML suspension 30 mL  30 mL Oral Q6H PRN Tell Rozelle      . aspirin EC tablet 81 mg  81 mg Oral Daily Danner Paulding   81 mg at 11/16/11 1029  . carvedilol (COREG) tablet 18.75 mg  18.75 mg Oral BID WC Lewayne Bunting, MD   18.75 mg at 11/16/11 0847  . colestipol (COLESTID) tablet 1 g  1 g Oral BID Latrecia Capito   1 g at 11/16/11 1029  . dicyclomine (BENTYL) capsule 10 mg  10 mg Oral Q6H PRN Kessler Kopinski      . diltiazem (CARDIZEM) injection 5 mg  5 mg Intravenous Once Victorious Cosio      . docusate sodium (COLACE) capsule 100 mg  100 mg Oral BID Mauri Tolen   100 mg at 11/16/11 1026  . etomidate (AMIDATE) injection   Intravenous PRN Hilario Quarry, MD   12 mg at 11/12/11 1914  . fluticasone (FLONASE) 50 MCG/ACT nasal spray 1 spray  1 spray Each Nare BID PRN Adara Kittle      . guaiFENesin (MUCINEX) 12 hr tablet 1,200 mg  1,200 mg Oral BID Christpher Stogsdill   1,200 mg at 11/16/11 1027  .  HYDROmorphone (DILAUDID) injection 1 mg  1 mg Intravenous Q4H PRN Aunna Snooks   1 mg at 11/15/11 1124  . latanoprost (XALATAN) 0.005 % ophthalmic solution 1 drop  1 drop Both Eyes QHS Awais Cobarrubias   1 drop at 11/15/11 2147  . Levofloxacin (LEVAQUIN) IVPB 750 mg  750 mg Intravenous Q24H Lillyrose Reitan   750 mg at 11/15/11 2148  . loratadine (CLARITIN) tablet 10 mg  10 mg Oral Daily Kameo Bains   10 mg at 11/16/11 1030  . methocarbamol (ROBAXIN) 500 mg in dextrose 5 % 50 mL IVPB  500 mg Intravenous Q8H PRN Kirtland Bouchard, PA      . methocarbamol (ROBAXIN) tablet 500 mg  500 mg Oral Q8H PRN Kirtland Bouchard, PA   500 mg at 11/15/11 1731  . montelukast (SINGULAIR) tablet 10 mg  10 mg Oral QHS Stratton Villwock   10 mg at 11/15/11 2200  . mulitivitamin with minerals tablet 1 tablet   1 tablet Oral Daily Lewin Pellow   1 tablet at 11/16/11 1030  . ondansetron (ZOFRAN) tablet 4 mg  4 mg Oral Q6H PRN Michell Kader       Or  . ondansetron (ZOFRAN) injection 4 mg  4 mg Intravenous Q6H PRN Dontrelle Mazon   4 mg at 11/12/11 2306  . oxyCODONE (Oxy IR/ROXICODONE) immediate release tablet 5 mg  5 mg Oral Q4H PRN Yousaf Sainato   5 mg at 11/16/11 1244  . pantoprazole (PROTONIX) EC tablet 40 mg  40 mg Oral Q1200 Berenice Oehlert   40 mg at 11/16/11 1200  . piperacillin-tazobactam (ZOSYN) IVPB 3.375 g  3.375 g Intravenous Q8H Callie Bunyard   3.375 g at 11/16/11 0848  . potassium chloride SA (K-DUR,KLOR-CON) CR tablet 40 mEq  40 mEq Oral BID Michi Herrmann   40 mEq at 11/16/11 1025  . rivaroxaban (XARELTO) tablet 10 mg  10 mg Oral Daily Kirtland Bouchard, PA   10 mg at 11/16/11 1026  . senna (SENOKOT) tablet 8.6 mg  1 tablet Oral BID Atul Delucia   8.6 mg at 11/16/11 1036  . zolpidem (AMBIEN) tablet 5 mg  5 mg Oral QHS PRN Matie Dimaano      . DISCONTD: 0.45 % sodium chloride infusion   Intravenous Continuous Isacc Turney 75 mL/hr at 11/15/11 1239    . DISCONTD: carvedilol (COREG) tablet 12.5 mg  12.5 mg Oral BID WC Amarria Andreasen   12.5 mg at 11/15/11 0806  . DISCONTD: vancomycin (VANCOCIN) IVPB 1000 mg/200 mL premix  1,000 mg Intravenous Q12H Randall K Absher, PHARMD   1,000 mg at 11/16/11 1036   Allergies  Allergen Reactions  . Oxybutynin Chloride     Tongue swelling; Rx from Dr Dannette Barbara  . Propoxyphene N-Acetaminophen     REACTION: made pt blood to thin  . Alendronate Sodium   . Ramipril     REACTION: cough   Principal Problem:  *Pneumonia Active Problems:  Ankle fracture, left  Syncope  Hypokalemia  RBBB (right bundle branch block)  HTN (hypertension), malignant  GERD (gastroesophageal reflux disease)  Atrial fibrillation   Vital signs in last 24 hours: Temp:  [98.6 F (37 C)-99.5 F (37.5 C)] 98.6 F (37 C) (12/25 0502) Pulse Rate:  [73-78] 73  (12/25 0502) Resp:   [18-19] 18  (12/25 0502) BP: (98-110)/(61-67) 110/65 mmHg (12/25 0502) SpO2:  [92 %-93 %] 93 % (12/25 0502) Weight change:  Last BM Date: 11/12/11  Intake/Output from previous day: 12/24  0701 - 12/25 0700 In: 1970 [P.O.:720; I.V.:750; IV Piggyback:500] Out: 3950 [Urine:3950] Intake/Output this shift: Total I/O In: -  Out: 3200 [Urine:3200]  Lab Results:  [facility] 11/15/11 0514 11/14/11 0540  WBC 8.4 8.7  HGB 11.0* 10.9*  HCT 32.1* 32.4*  PLT 223 181   BMET  Basename 11/15/11 0514 11/14/11 0540  NA 135 138  K 4.1 4.1  CL 99 106  CO2 26 26  GLUCOSE 92 104*  BUN 5* 7  CREATININE 0.70 0.70  CALCIUM 9.2 9.1    Studies/Results: Dg Chest Port 1 View  11/15/2011  *RADIOLOGY REPORT*  Clinical Data: Cough, asthma, fever  PORTABLE CHEST - 1 VIEW  Comparison: 11/12/2011  Findings: Interval worsening of focal airspace consolidation in the left upper lobe.  Patchy infiltrate or atelectasis at the left lung base is now evident.  Right lung remains clear.  Heart size upper limits normal.  No definite effusion.  IMPRESSION:  1.  Worsening left lung airspace disease.  Original Report Authenticated By: Osa Craver, M.D.    Medications: I have reviewed the patient's current medications.   Physical exam GENERAL- alert  HEAD- normal atraumatic, no neck masses, normal thyroid, no jvd  RESPIRATORY- some cough. Now wheezing.  CVS- regular rate and rhythm, S1, S2 normal, no murmur, click, rub or gallop  ABDOMEN- abdomen is soft without significant tenderness, masses, organomegaly or guarding  NEURO- Grossly normal  EXTREMITIES- s/p left ankle surgery, no overt bleeding. Foot cold.    Plan   .Pneumonia- better today. Defervesced. Start deescalate abx, may have aspirated.  Marland KitchenAFIB with rvr, new- cardiology appreciated. Back in sinus.  Continue coreg. On Asa. No plans for long term anticoagulation. Marland KitchenHTN (hypertension), malignant- BP ok. Continue coreg. Marland KitchenGERD (gastroesophageal  reflux disease)- on ppi. Marland KitchenAnkle fracture, left-s/p orif. Appreciate ortho input. For snf.  Condition guarded. Discussed plan of care with daughter and husband at the bedside.          Reyhan Moronta 11/16/2011 1:35 PM Pager: [phone].

## 2011-11-16 NOTE — Progress Notes (Signed)
Subjective:  No new cardiac complaints.  Remaining in NSR. No dizziness or syncope.  Has not been out of bed yet. Still has nonproductive cough.  Objective:  Vital Signs in the last 24 hours: Temp:  [98.6 F (37 C)-99.5 F (37.5 C)] 98.6 F (37 C) (12/25 0502) Pulse Rate:  [73-78] 73  (12/25 0502) Resp:  [18-19] 18  (12/25 0502) BP: (98-110)/(61-67) 110/65 mmHg (12/25 0502) SpO2:  [92 %-93 %] 93 % (12/25 0502)  Intake/Output from previous day: 12/24 0701 - 12/25 0700 In: 1970 [P.O.:720; I.V.:750; IV Piggyback:500] Out: 3950 [Urine:3950] Intake/Output from this shift:       . aspirin EC  81 mg Oral Daily  . carvedilol  18.75 mg Oral BID WC  . colestipol  1 g Oral BID  . diltiazem  5 mg Intravenous Once  . docusate sodium  100 mg Oral BID  . guaiFENesin  1,200 mg Oral BID  . latanoprost  1 drop Both Eyes QHS  . levofloxacin (LEVAQUIN) IV  750 mg Intravenous Q24H  . loratadine  10 mg Oral Daily  . montelukast  10 mg Oral QHS  . mulitivitamin with minerals  1 tablet Oral Daily  . pantoprazole  40 mg Oral Q1200  . piperacillin-tazobactam (ZOSYN)  IV  3.375 g Intravenous Q8H  . potassium chloride  40 mEq Oral BID  . rivaroxaban  10 mg Oral Daily  . senna  1 tablet Oral BID  . vancomycin  1,000 mg Intravenous Q12H  . DISCONTD: carvedilol  12.5 mg Oral BID WC      . sodium chloride 75 mL/hr at 11/15/11 1239    Physical Exam: The patient appears to be in no distress.  Head and neck exam reveals that the pupils are equal and reactive.  The extraocular movements are full.  There is no scleral icterus.  Mouth and pharynx are benign.  No lymphadenopathy.  No carotid bruits.  The jugular venous pressure is normal.  Thyroid is not enlarged or tender.  Chest Few basilar rhonchi.  Heart reveals no abnormal lift or heave.  First and second heart sounds are normal.  There is soft systolic murmur at base. No gallop. No rub.  The abdomen is soft and nontender.  Bowel sounds are  normoactive.  There is no hepatosplenomegaly or mass.  There are no abdominal bruits.  Extremities: No phlebitis. Left ankle immobilized in boot.  Neurologic exam is normal strength and no lateralizing weakness.  No sensory deficits.  Integument reveals no rash  Lab Results:  [facility] 11/15/11 0514 11/14/11 0540  WBC 8.4 8.7  HGB 11.0* 10.9*  PLT 223 181    Basename 11/15/11 0514 11/14/11 0540  NA 135 138  K 4.1 4.1  CL 99 106  CO2 26 26  GLUCOSE 92 104*  BUN 5* 7  CREATININE 0.70 0.70    Basename 11/15/11 0845  TROPONINI <0.30   Hepatic Function Panel  Basename 11/15/11 0514  PROT 6.0  ALBUMIN 2.5*  AST 19  ALT 12  ALKPHOS 80  BILITOT 0.6  BILIDIR --  IBILI --   No results found for this basename: CHOL in the last 72 hours No results found for this basename: PROTIME in the last 72 hours  Imaging: [facility]* *[facility]* [address] [place], [place] [address] [phone]  ------------------------------------------------------------ Transthoracic Echocardiography  Patient: Valerie Rollins, Valerie Rollins MR #: [id number] Study Date: 11/14/2011 Gender: F Age: 75 Height: 165.1cm Weight: 76.2kg BSA:  1.9m^2 Pt. Status: Room: 1443  SONOGRAPHER Ellin Goodie, RDCS PERFORMING [place], [facility] ADMITTING Holcomb, Simbiso ATTENDING Ranga, Simbiso ORDERING Ranga, Simbiso REFERRING Ranga, Simbiso SONOGRAPHER Georgian Co, RDCS, CCT cc:  ------------------------------------------------------------ LV EF: 60% - 65%  ------------------------------------------------------------ Indications: 780.2 Syncope.  ------------------------------------------------------------ History: PMH: Right bundle branch block on electrocardiogram. Systolic heart murmur. Syncope which caused a fall and left ankle fracture. Pneumonia. Syncope and murmur. Risk factors: Hypertension.  Dyslipidemia.  ------------------------------------------------------------ Study Conclusions  - Left ventricle: The cavity size was normal. Wall thickness was normal. Systolic function was normal. The estimated ejection fraction was in the range of 60% to 65%. Wall motion was normal; there were no regional wall motion abnormalities. Doppler parameters are consistent with abnormal left ventricular relaxation (grade 1 diastolic dysfunction). - Mitral valve: Calcified annulus. - Left atrium: The atrium was mildly dilated. - Right ventricle: The cavity size was mildly dilated. - Right atrium: The atrium was mildly to moderately dilated. - Pulmonary arteries: Systolic pressure was moderately increased. PA peak pressure: 52mm Hg (S).    Cardiac Studies: Telemetry shows NSR. Assessment/Plan:  * Atrial fibrillation (11/15/2011)   Assessment: Remains in NSR   Plan: Continue present carvedilol.            EKG today.   LOS: 4 days    Cassell Clement 11/16/2011, 8:15 AM

## 2011-11-17 LAB — CBC
HCT: 34.1 % — ABNORMAL LOW (ref 36.0–46.0)
MCV: 86.3 fL (ref 78.0–100.0)
RBC: 3.95 MIL/uL (ref 3.87–5.11)
RDW: 12.4 % (ref 11.5–15.5)
WBC: 7.8 10*3/uL (ref 4.0–10.5)

## 2011-11-17 LAB — BASIC METABOLIC PANEL
BUN: 12 mg/dL (ref 6–23)
CO2: 23 mEq/L (ref 19–32)
Chloride: 102 mEq/L (ref 96–112)
Creatinine, Ser: 0.85 mg/dL (ref 0.50–1.10)

## 2011-11-17 NOTE — Progress Notes (Signed)
Summary:  Valerie Rollins is a pleasant 75 y.o. female with history of hypertension, hyperlipidemia who admitted after she passed out at home. Upon admission she was found to have left sided community acquired pna and fracture dislocation of left ankle. Patient had workup for syncope, including ct brain/carotid duplex/2decho, which were unremarkable. She likely had orthostaitic hypotension leading to fall. Telemetry picked up afib on 11/14/11, prompting cardiology input. Patient converted back to sinus and she is to continue coreg and long term anticoagulation was not recommended by cardiology as afib felt to be transient and related to ongoing medical/surgical issues including fever. Patient had orif on 11/14/11 by Dr Lestine Box, and she should follow with him outpatient in 2 weeks. She developed post op fever, prompting broadening of abx coverage. CXR suggested worsening of left sided pna. Chances are patient aspirated as this happened same day after surgery. She has done ok on vanc/zosyn/levaquin, but will start deescalating abx today- d/c vanc.     Subjective: Chart reviewed. Patient complains of minimal nonproductive cough but denies dyspnea or chest pain. She does not complain of significant left ankle pain. She indicates that overall she is improving.  Objective: Blood pressure 142/82, pulse 79, temperature 98.4 F (36.9 C), temperature source Oral, resp. rate 18, height 5' 5.5" (1.664 m), weight 76.5 kg (168 lb 10.4 oz), SpO2 95.00%.  Intake/Output Summary (Last 24 hours) at 11/17/11 1052 Last data filed at 11/17/11 0900  Gross per 24 hour  Intake   1760 ml  Output   4725 ml  Net  -2965 ml   General exam: Patient lying comfortably supine in bed and in no obvious distress. Respiratory system: Clear. No increased work of breathing. Cardiovascular system: First and second heart sounds heard, regular. No JVD or murmur. Patient is not on telemetry. Gastrointestinal system: Abdomen is  nondistended, soft and normal bowel sounds heard. Central nervous system: alert and oriented. No focal neurological deficits Extremities: Left leg is in splint. Patient able to wiggle toes and the color looked normal.  Lab Results: Basic Metabolic Panel:  Basename 11/17/11 0440 11/15/11 0514  NA 136 135  K 4.5 4.1  CL 102 99  CO2 23 26  GLUCOSE 86 92  BUN 12 5*  CREATININE 0.85 0.70  CALCIUM 9.5 9.2  MG -- --  PHOS -- --   Liver Function Tests:  [facility] 11/15/11 0514  AST 19  ALT 12  ALKPHOS 80  BILITOT 0.6  PROT 6.0  ALBUMIN 2.5*   No results found for this basename: LIPASE:2,AMYLASE:2 in the last 72 hours No results found for this basename: AMMONIA:2 in the last 72 hours CBC:  Basename 11/17/11 0440 11/15/11 0514  WBC 7.8 8.4  NEUTROABS -- --  HGB 11.5* 11.0*  HCT 34.1* 32.1*  MCV 86.3 84.7  PLT 291 223   Cardiac Enzymes:  Basename 11/15/11 0845  CKTOTAL 128  CKMB 3.0  CKMBINDEX --  TROPONINI <0.30    Micro Results: Recent Results (from the past 240 hour(s))  URINE CULTURE     Status: Normal   Collection Time   11/13/11  4:00 AM      Component Value Range Status Comment   Specimen Description URINE, CLEAN CATCH   Final    Special Requests NONE   Final    Setup Time [id number]   Final    Colony Count NO GROWTH   Final    Culture NO GROWTH   Final    Report Status 11/14/2011 FINAL  Final   CULTURE, BLOOD (ROUTINE X 2)     Status: Normal (Preliminary result)   Collection Time   11/13/11  7:00 AM      Component Value Range Status Comment   Specimen Description BLOOD LEFT ARM   Final    Special Requests Normal BOTTLES DRAWN AEROBIC AND ANAEROBIC 5CC   Final    Setup Time [id number]   Final    Culture     Final    Value:        BLOOD CULTURE RECEIVED NO GROWTH TO DATE CULTURE WILL BE HELD FOR 5 DAYS BEFORE ISSUING A FINAL NEGATIVE REPORT   Report Status PENDING   Incomplete   CULTURE, BLOOD (ROUTINE X 2)     Status: Normal (Preliminary result)    Collection Time   11/13/11  7:12 AM      Component Value Range Status Comment   Specimen Description BLOOD LEFT HAND   Final    Special Requests Normal BOTTLES DRAWN AEROBIC AND ANAEROBIC 5CC   Final    Setup Time [id number]   Final    Culture     Final    Value:        BLOOD CULTURE RECEIVED NO GROWTH TO DATE CULTURE WILL BE HELD FOR 5 DAYS BEFORE ISSUING A FINAL NEGATIVE REPORT   Report Status PENDING   Incomplete   CULTURE, BLOOD (ROUTINE X 2)     Status: Normal (Preliminary result)   Collection Time   11/14/11  7:35 PM      Component Value Range Status Comment   Specimen Description BLOOD LEFT AC   Final    Special Requests BOTTLES DRAWN AEROBIC AND ANAEROBIC 5 CC EACH   Final    Setup Time [id number]   Final    Culture     Final    Value:        BLOOD CULTURE RECEIVED NO GROWTH TO DATE CULTURE WILL BE HELD FOR 5 DAYS BEFORE ISSUING A FINAL NEGATIVE REPORT   Report Status PENDING   Incomplete   CULTURE, BLOOD (ROUTINE X 2)     Status: Normal (Preliminary result)   Collection Time   11/14/11  7:40 PM      Component Value Range Status Comment   Specimen Description BLOOD LEFT HAND   Final    Special Requests BOTTLES DRAWN AEROBIC AND ANAEROBIC 5 CC EACH   Final    Setup Time [id number]   Final    Culture     Final    Value:        BLOOD CULTURE RECEIVED NO GROWTH TO DATE CULTURE WILL BE HELD FOR 5 DAYS BEFORE ISSUING A FINAL NEGATIVE REPORT   Report Status PENDING   Incomplete   URINE CULTURE     Status: Normal   Collection Time   11/15/11  8:22 AM      Component Value Range Status Comment   Specimen Description URINE, CATHETERIZED   Final    Special Requests NONE   Final    Setup Time [id number]   Final    Colony Count NO GROWTH   Final    Culture NO GROWTH   Final    Report Status 11/16/2011 FINAL   Final     Studies/Results: Dg Ankle 2 Views Left  11/13/2011  *RADIOLOGY REPORT*  Clinical Data: Post reduction  LEFT ANKLE - 2 VIEW  Comparison: The left ankle  films from 11/12/2011  Findings:  There is little change  in the tibiotalar subluxation with the talus lateral to the distal tibia on the frontal view. The medial and lateral malleolar fractures appear better positioned.  IMPRESSION: Little change in tibiotalar subluxation with medial and lateral malleolar fractures in better position compared to the initial images.  Original Report Authenticated By: Juline Patch, M.D.   Dg Ankle 2 Views Left  11/12/2011  *RADIOLOGY REPORT*  Clinical Data: Fall with pain and deformity  LEFT ANKLE - 2 VIEW  Comparison: None.  Findings: There is fracture dislocation of the ankle joint.  There is a transverse fracture of the medial malleolus.  There is an oblique fracture of the distal fibula.  Talar dome is dislocated laterally and posterior relative to the tibial articular surface. There is medial and the anterior angulation of the fibular fracture.  No fracture of the talus or bones of the foot identified.  IMPRESSION: Fracture dislocation of the ankle joint as described.  Original Report Authenticated By: Thomasenia Sales, M.D.   Dg Ankle Complete Left  11/12/2011  *RADIOLOGY REPORT*  Clinical Data: Post reduction  LEFT ANKLE COMPLETE - 3+ VIEW  Comparison: 11/12/2011  Findings: Fracture dislocation of the ankle with partial reduction. Alignment is significantly improved.  There is a fracture of the medial malleolus and distal fibula.  There remains widening of  the ankle joint anteriorly and medially.  Plaster has been placed obscuring bony detail.  IMPRESSION: Partial reduction of fracture dislocation of the ankle.  Original Report Authenticated By: Camelia Phenes, M.D.   Ct Head Wo Contrast  11/12/2011  *RADIOLOGY REPORT*  Clinical Data:  75 year old female with syncope.  Fall at 4:00 a.m. today.  CT HEAD WITHOUT CONTRAST CT CERVICAL SPINE WITHOUT CONTRAST  Technique:  Multidetector CT imaging of the head and cervical spine was performed following the standard protocol  without intravenous contrast.  Multiplanar CT image reconstructions of the cervical spine were also generated.  Comparison:  None.  CT HEAD  Findings: Scattered paranasal sinus mucosal thickening.  No sinus fluid levels.  Mastoids are clear. No acute osseous abnormality identified.  Postoperative changes to the left globe.  Otherwise negative orbit and scalp soft tissues.  Cerebral volume is within normal limits for age.  No midline shift, ventriculomegaly, mass effect, evidence of mass lesion, intracranial hemorrhage or evidence of cortically based acute infarction.  Gray-white matter differentiation is within normal limits throughout the brain.  No suspicious intracranial vascular hyperdensity. Calcified atherosclerosis at the skull base.  IMPRESSION:  Normal noncontrast CT appearance of the brain for age.  CT CERVICAL SPINE  Findings: Visualized skull base is intact.  No atlanto-occipital dissociation.  Straightening of cervical lordosis. Cervicothoracic junction alignment is within normal limits.  Bilateral posterior element alignment is within normal limits.  Multilevel advanced facet degeneration.  Less pronounced disc degeneration.  No acute cervical fracture. Visualized paraspinal soft tissues are within normal limits.  IMPRESSION: No acute fracture or listhesis identified in the cervical spine. Ligamentous injury is not excluded.  Advanced facet degeneration.  Original Report Authenticated By: Harley Hallmark, M.D.   Ct Cervical Spine Wo Contrast  11/12/2011  *RADIOLOGY REPORT*  Clinical Data:  75 year old female with syncope.  Fall at 4:00 a.m. today.  CT HEAD WITHOUT CONTRAST CT CERVICAL SPINE WITHOUT CONTRAST  Technique:  Multidetector CT imaging of the head and cervical spine was performed following the standard protocol without intravenous contrast.  Multiplanar CT image reconstructions of the cervical spine were also generated.  Comparison:  None.  CT HEAD  Findings: Scattered paranasal sinus  mucosal thickening.  No sinus fluid levels.  Mastoids are clear. No acute osseous abnormality identified.  Postoperative changes to the left globe.  Otherwise negative orbit and scalp soft tissues.  Cerebral volume is within normal limits for age.  No midline shift, ventriculomegaly, mass effect, evidence of mass lesion, intracranial hemorrhage or evidence of cortically based acute infarction.  Gray-white matter differentiation is within normal limits throughout the brain.  No suspicious intracranial vascular hyperdensity. Calcified atherosclerosis at the skull base.  IMPRESSION:  Normal noncontrast CT appearance of the brain for age.  CT CERVICAL SPINE  Findings: Visualized skull base is intact.  No atlanto-occipital dissociation.  Straightening of cervical lordosis. Cervicothoracic junction alignment is within normal limits.  Bilateral posterior element alignment is within normal limits.  Multilevel advanced facet degeneration.  Less pronounced disc degeneration.  No acute cervical fracture. Visualized paraspinal soft tissues are within normal limits.  IMPRESSION: No acute fracture or listhesis identified in the cervical spine. Ligamentous injury is not excluded.  Advanced facet degeneration.  Original Report Authenticated By: Harley Hallmark, M.D.   Dg Chest Port 1 View  11/15/2011  *RADIOLOGY REPORT*  Clinical Data: Cough, asthma, fever  PORTABLE CHEST - 1 VIEW  Comparison: 11/12/2011  Findings: Interval worsening of focal airspace consolidation in the left upper lobe.  Patchy infiltrate or atelectasis at the left lung base is now evident.  Right lung remains clear.  Heart size upper limits normal.  No definite effusion.  IMPRESSION:  1.  Worsening left lung airspace disease.  Original Report Authenticated By: Osa Craver, M.D.   Dg Chest Port 1 View  11/12/2011  *RADIOLOGY REPORT*  Clinical Data: Patient fell today, cough, congestion  PORTABLE CHEST - 1 VIEW  Comparison: Chest x-ray of  06/25/2008  Findings: There is vague opacity in the left mid lung field. This may represent pneumonia, but a neoplasm cannot be excluded and follow-up chest x-ray is recommended to ensure clearing.  Otherwise the lungs are clear.  Mediastinal contours appear stable.  There is mild cardiomegaly present.  There are degenerative changes throughout the thoracic spine.  IMPRESSION:  1.  Opacity in the left midlung may represent pneumonia, but recommend follow-up chest x-ray to exclude neoplasm. 2.  Stable cardiomegaly.  Original Report Authenticated By: Juline Patch, M.D.   Dg Knee Complete 4 Views Left  11/12/2011  *RADIOLOGY REPORT*  Clinical Data: Fall  LEFT KNEE - COMPLETE 4+ VIEW  Comparison: None.  Findings: Normal alignment.  No fracture.  Mild degenerative change in the knee joint with spurring of the medial tibial spine and mild spurring of the patella.  No joint effusion.  IMPRESSION: Negative for fracture.  Original Report Authenticated By: Camelia Phenes, M.D.    Medications: Scheduled Meds:   . aspirin EC  81 mg Oral Daily  . carvedilol  18.75 mg Oral BID WC  . colestipol  1 g Oral BID  . diltiazem  5 mg Intravenous Once  . docusate sodium  100 mg Oral BID  . guaiFENesin  1,200 mg Oral BID  . latanoprost  1 drop Both Eyes QHS  . levofloxacin (LEVAQUIN) IV  750 mg Intravenous Q24H  . loratadine  10 mg Oral Daily  . montelukast  10 mg Oral QHS  . mulitivitamin with minerals  1 tablet Oral Daily  . pantoprazole  40 mg Oral Q1200  . piperacillin-tazobactam (ZOSYN)  IV  3.375 g Intravenous Q8H  .  potassium chloride  40 mEq Oral BID  . rivaroxaban  10 mg Oral Daily  . senna  1 tablet Oral BID  . DISCONTD: vancomycin  1,000 mg Intravenous Q12H   Continuous Infusions:   . DISCONTD: sodium chloride 75 mL/hr at 11/15/11 1239   PRN Meds:.acetaminophen, albuterol-ipratropium, ALPRAZolam, alum & mag hydroxide-simeth, dicyclomine, etomidate, fluticasone, HYDROmorphone,  methocarbamol(ROBAXIN) IV, methocarbamol, ondansetron (ZOFRAN) IV, ondansetron, oxyCODONE, zolpidem  Assessment/Plan: 1. Pneumonia: Improving. Continue intravenous antibiotics. Encouraged incentive spirometer use. 2. Transient A. fib: Reverted to sinus rhythm. Continue Coreg and aspirin. No anticoagulation as per cardiology. 3. Left ankle fracture, status post open reduction internal fixation, postoperative day #3: Outpatient followup with orthopedics. Continue Xarelto DVT prophylaxis for 6 weeks while nonweightbearing. 4. Anemia: Stable 5. Hypertension: Reasonably controlled  Disposition: Possible discharge in the next 24 hours, to skilled nursing facility when bed is available. Mobilize per physical therapy.     Sheamus Hasting 11/17/2011, 10:52 AM

## 2011-11-17 NOTE — Progress Notes (Signed)
Subjective:  Doing well this am. No chest pain or palpitations.  Cough is improved.  Objective:  Vital Signs in the last 24 hours: Temp:  [98.1 F (36.7 C)-98.5 F (36.9 C)] 98.4 F (36.9 C) (12/26 0640) Pulse Rate:  [66-79] 79  (12/26 0640) Resp:  [18-20] 18  (12/26 0640) BP: (112-142)/(66-82) 142/82 mmHg (12/26 0640) SpO2:  [93 %-95 %] 95 % (12/26 0640)  Intake/Output from previous day: 12/25 0701 - 12/26 0700 In: 1760 [P.O.:1560; IV Piggyback:200] Out: 4975 [Urine:4975] Intake/Output from this shift:       . aspirin EC  81 mg Oral Daily  . carvedilol  18.75 mg Oral BID WC  . colestipol  1 g Oral BID  . diltiazem  5 mg Intravenous Once  . docusate sodium  100 mg Oral BID  . guaiFENesin  1,200 mg Oral BID  . latanoprost  1 drop Both Eyes QHS  . levofloxacin (LEVAQUIN) IV  750 mg Intravenous Q24H  . loratadine  10 mg Oral Daily  . montelukast  10 mg Oral QHS  . mulitivitamin with minerals  1 tablet Oral Daily  . pantoprazole  40 mg Oral Q1200  . piperacillin-tazobactam (ZOSYN)  IV  3.375 g Intravenous Q8H  . potassium chloride  40 mEq Oral BID  . rivaroxaban  10 mg Oral Daily  . senna  1 tablet Oral BID  . DISCONTD: vancomycin  1,000 mg Intravenous Q12H      . DISCONTD: sodium chloride 75 mL/hr at 11/15/11 1239    Physical Exam: The patient appears to be in no distress.  Head and neck exam reveals that the pupils are equal and reactive.  The extraocular movements are full.  There is no scleral icterus.  Mouth and pharynx are benign.  No lymphadenopathy.  No carotid bruits.  The jugular venous pressure is normal.  Thyroid is not enlarged or tender.  Chest Few basilar rhonchi.  Heart reveals no abnormal lift or heave.  First and second heart sounds are normal.  There is soft systolic murmur at base. No gallop. No rub.  The abdomen is soft and nontender.  Bowel sounds are normoactive.  There is no hepatosplenomegaly or mass.  There are no abdominal  bruits.    Neurologic exam is normal strength and no lateralizing weakness.  No sensory deficits.  Integument reveals no rash  Lab Results:  Basename 11/17/11 0440 11/15/11 0514  WBC 7.8 8.4  HGB 11.5* 11.0*  PLT 291 223    Basename 11/17/11 0440 11/15/11 0514  NA 136 135  K 4.5 4.1  CL 102 99  CO2 23 26  GLUCOSE 86 92  BUN 12 5*  CREATININE 0.85 0.70    Basename 11/15/11 0845  TROPONINI <0.30   Hepatic Function Panel  Basename 11/15/11 0514  PROT 6.0  ALBUMIN 2.5*  AST 19  ALT 12  ALKPHOS 80  BILITOT 0.6  BILIDIR --  IBILI --   No results found for this basename: CHOL in the last 72 hours No results found for this basename: PROTIME in the last 72 hours  Imaging: [facility]* *[facility]* [address] [place], [place] [address] [phone]  ------------------------------------------------------------ Transthoracic Echocardiography  Patient: Valerie, Rollins MR #: [id number] Study Date: 11/14/2011 Gender: F Age: 75 Height: 165.1cm Weight: 76.2kg BSA: 1.91m^2 Pt. Status: Room: 1443  SONOGRAPHER Ellin Goodie, RDCS PERFORMING [facility] ADMITTING [place], [place] ATTENDING Ranga, Simbiso ORDERING Ranga, [place] REFERRING [place], [place] SONOGRAPHER Leotis Shames  Williams, RDCS, CCT cc:  ------------------------------------------------------------ LV EF: 60% - 65%  ------------------------------------------------------------ Indications: 780.2 Syncope.  ------------------------------------------------------------ History: PMH: Right bundle branch block on electrocardiogram. Systolic heart murmur. Syncope which caused a fall and left ankle fracture. Pneumonia. Syncope and murmur. Risk factors: Hypertension. Dyslipidemia.  ------------------------------------------------------------ Study Conclusions  - Left ventricle: The cavity size was normal. Wall thickness was normal. Systolic function was normal. The  estimated ejection fraction was in the range of 60% to 65%. Wall motion was normal; there were no regional wall motion abnormalities. Doppler parameters are consistent with abnormal left ventricular relaxation (grade 1 diastolic dysfunction). - Mitral valve: Calcified annulus. - Left atrium: The atrium was mildly dilated. - Right ventricle: The cavity size was mildly dilated. - Right atrium: The atrium was mildly to moderately dilated. - Pulmonary arteries: Systolic pressure was moderately increased. PA peak pressure: 52mm Hg (S).    Cardiac Studies: EKG yesterday NSR.  RBBB stable. Assessment/Plan:  * Atrial fibrillation (11/15/2011)   Assessment: Remains in NSR   Plan: Continue present carvedilol.             LOS: 5 days    Cassell Clement 11/17/2011, 7:25 AM

## 2011-11-18 ENCOUNTER — Encounter (HOSPITAL_COMMUNITY): Payer: Self-pay | Admitting: Orthopedic Surgery

## 2011-11-18 MED ORDER — SENNA 8.6 MG PO TABS: 2.0000 | ORAL_TABLET | Freq: Every day | ORAL | Status: DC

## 2011-11-18 MED ORDER — POLYETHYLENE GLYCOL 3350 17 G PO PACK: 17.0000 g | PACK | Freq: Every day | ORAL | Status: AC

## 2011-11-18 MED ORDER — ADULT MULTIVITAMIN W/MINERALS CH: 1.0000 | ORAL_TABLET | Freq: Every day | ORAL | Status: DC

## 2011-11-18 MED ORDER — ACETAMINOPHEN 325 MG PO TABS: 650.0000 mg | ORAL_TABLET | ORAL | Status: AC | PRN

## 2011-11-18 MED ORDER — LEVOFLOXACIN 750 MG PO TABS: 750.0000 mg | ORAL_TABLET | Freq: Every day | ORAL | Status: AC

## 2011-11-18 MED ORDER — RIVAROXABAN 10 MG PO TABS: 10.0000 mg | ORAL_TABLET | Freq: Every day | ORAL | Status: DC

## 2011-11-18 MED ORDER — GUAIFENESIN ER 600 MG PO TB12: 1200.0000 mg | ORAL_TABLET | Freq: Two times a day (BID) | ORAL | Status: DC

## 2011-11-18 MED ORDER — ASPIRIN 81 MG PO TBEC: 81.0000 mg | DELAYED_RELEASE_TABLET | Freq: Every day | ORAL | Status: AC

## 2011-11-18 MED ORDER — POTASSIUM CHLORIDE CRYS ER 20 MEQ PO TBCR: 40.0000 meq | EXTENDED_RELEASE_TABLET | Freq: Two times a day (BID) | ORAL | Status: DC

## 2011-11-18 MED ORDER — DSS 100 MG PO CAPS: 100.0000 mg | ORAL_CAPSULE | Freq: Two times a day (BID) | ORAL | Status: AC

## 2011-11-18 MED ORDER — CARVEDILOL 6.25 MG PO TABS: 18.7500 mg | ORAL_TABLET | Freq: Two times a day (BID) | ORAL | Status: DC

## 2011-11-18 MED ORDER — METHOCARBAMOL 500 MG PO TABS: 500.0000 mg | ORAL_TABLET | Freq: Three times a day (TID) | ORAL | Status: AC | PRN

## 2011-11-18 MED ORDER — OXYCODONE HCL 5 MG PO TABS: 5.0000 mg | ORAL_TABLET | ORAL | Status: AC | PRN

## 2011-11-18 NOTE — Progress Notes (Signed)
Physical Therapy Evaluation Patient Details Name: Valerie Rollins MRN: [id number] DOB: 16-Jan-1936 Today's Date: 11/18/2011 Time: 4098-1191 Charge: EVII  Problem List:  Patient Active Problem List  Diagnoses  . HYPERLIPIDEMIA  . HYPERTENSION  . SUPERFICIAL THROMBOPHLEBITIS  . VARICOSE VEINS, LOWER EXTREMITIES  . GERD  . DIVERTICULOSIS, COLON  . IBS  . OSTEOARTHRITIS  . HOARSENESS  . HEART MURMUR, SYSTOLIC  . SKIN CANCER, HX OF  . DVT, HX OF  . Personal History of Other Diseases of Digestive Disease  . Pneumonia  . Syncope  . Hypokalemia  . RBBB (right bundle branch block)  . HTN (hypertension), malignant  . GERD (gastroesophageal reflux disease)  . Ankle fracture, left  . Atrial fibrillation    Past Medical History:  Past Medical History  Diagnosis Date  . Diverticulitis 2002    based on CT scan  . Hyperlipidemia   . Hypertension   . Superficial phlebitis     x2, 1999, 2000  . Ocular hypertension     glaucoma suspect, Dr. Dagoberto Ligas  . Skin cancer   . Endometriosis   . Gastritis 2006    @ Endo  . Hiatal hernia   . Diverticulosis   . GERD (gastroesophageal reflux disease)   . DVT (deep venous thrombosis)    Past Surgical History:  Past Surgical History  Procedure Date  . Total abdominal hysterectomy w/ bilateral salpingoophorectomy 1980    dysfunctional menses, endometriosis  . Varicose vein surgery     1960, 1965, 2009  . Tonsillectomy   . Cataract extraction w/ intraocular lens implant     OS  . Colonoscopy 1995, 2002    diverticulosis    PT Assessment/Plan/Recommendation PT Assessment Clinical Impression Statement: Pt would benefit from skilled acute PT services in order to improve transfers and ambulation with NWB L LE to prepare for D/C to SNF for further rehab.  Pt did well with NWB status. PT Recommendation/Assessment: Patient will need skilled PT in the acute care venue PT Problem List: Decreased activity tolerance;Decreased  mobility;Decreased knowledge of use of DME;Pain PT Therapy Diagnosis : Difficulty walking;Acute pain PT Plan PT Frequency: Min 4X/week PT Treatment/Interventions: Gait training;DME instruction;Functional mobility training;Therapeutic activities;Therapeutic exercise;Balance training;Neuromuscular re-education;Patient/family education PT Recommendation Follow Up Recommendations: Skilled nursing facility Equipment Recommended: Defer to next venue PT Goals  Acute Rehab PT Goals PT Goal Formulation: With patient Time For Goal Achievement: 7 days Pt will go Supine/Side to Sit: with modified independence;with HOB 0 degrees PT Goal: Supine/Side to Sit - Progress: Not met Pt will go Sit to Stand: with supervision PT Goal: Sit to Stand - Progress: Not met Pt will go Stand to Sit: with supervision PT Goal: Stand to Sit - Progress: Not met Pt will Ambulate: 16 - 50 feet;with supervision;with rolling walker PT Goal: Ambulate - Progress: Not met  PT Evaluation Precautions/Restrictions  Precautions Precautions: Fall Restrictions Weight Bearing Restrictions: Yes LLE Weight Bearing: Non weight bearing Prior Functioning  Home Living Lives With: Spouse Type of Home: House Home Layout: One level Home Access: Stairs to enter Entrance Stairs-Rails: Right Entrance Stairs-Number of Steps: 6 Home Adaptive Equipment: Walker - standard Prior Function Level of Independence: Independent with basic ADLs;Independent with gait;Independent with transfers Cognition Cognition Arousal/Alertness: Awake/alert Overall Cognitive Status: Appears within functional limits for tasks assessed Sensation/Coordination   Extremity Assessment RLE Assessment RLE Assessment: Within Functional Limits (per functional observation) LLE Assessment LLE Assessment: Not tested (NT 2* ankle splint s/p ORIF, able to wiggle toes) Mobility (including Balance) Bed  Mobility Bed Mobility: Yes Supine to Sit: 5: Supervision;HOB  elevated (Comment degrees) Supine to Sit Details (indicate cue type and reason): increased time, verbal cues for technique Transfers Transfers: Yes Sit to Stand: 3: Mod assist;From bed;With upper extremity assist Sit to Stand Details (indicate cue type and reason): assist 2* weakness and NWB status, verbal cue for hand placement Stand to Sit: 3: Mod assist;To chair/3-in-1;With armrests Stand to Sit Details: verbal cues for armrests Stand Pivot Transfers: 3: Mod assist Stand Pivot Transfer Details (indicate cue type and reason): with RW, assist 2* weakness and NWB status, verbal cues to turn R heel 2* pt unable to hop today Ambulation/Gait Ambulation/Gait: No (pt unable today)    Exercise    End of Session PT - End of Session Equipment Utilized During Treatment: Gait belt Activity Tolerance: Patient limited by pain Patient left: in chair;with call bell in reach;with family/visitor present Nurse Communication: Mobility status for transfers;Need for lift equipment;Weight bearing status General Behavior During Session: [facility] for tasks performed Cognition: [facility] for tasks performed  Anjeli Casad,KATHrine E 11/18/2011, 11:33 AM Pager: [phone]

## 2011-11-18 NOTE — Discharge Summary (Signed)
Discharge Summary  Valerie Rollins MR#: [id number]  DOB:May 16, 1936  Date of Admission: 11/12/2011 Date of Discharge: 11/18/2011  Patient's PCP: Marga Melnick, MD, MD  Attending Physician:Hazley Dezeeuw  Consults: 1. Cardiology: Dr. Olga Millers 2. Orthopedics: Dr. Leonides Grills   Discharge Diagnoses: 1. Syncope 2. Left mid lobe Community acquired Pneumonia 3. Diarrhea, resolved 4. Left ankle fracture, status post open reduction and internal fixation 5. Hypertension 6. Transient atrial fibrillation 7. Hypokalemia 8. Right bundle branch block     Brief Admitting History and Physical Valerie Rollins is a pleasant 75 y.o. female with history of hypertension, hyperlipidemia who was admitted after she passed out at home. Upon admission she was found to have left sided community acquired pna and fracture dislocation of left ankle. Patient remembered that she went to the bathroom and fell on her way back to the bedroom. She had cough which is productive of green sputum for a week prior to admission. She also had diarrhea prior to admission and had not been eating much. Vital signs on admission were stable except a temperature of 99.3. She was hypokalemic with a potassium of 2.9. White blood cell was 12.4. CT of the head was normal. CT of the cervical spine showed no acute fracture.   Discharge Medications Current Discharge Medication List    START taking these medications   Details  acetaminophen (TYLENOL) 325 MG tablet Take 2 tablets (650 mg total) by mouth every 4 (four) hours as needed for pain or fever (Mild pain.).    aspirin EC 81 MG EC tablet Take 1 tablet (81 mg total) by mouth daily.    docusate sodium 100 MG CAPS Take 100 mg by mouth 2 (two) times daily.    guaiFENesin (MUCINEX) 600 MG 12 hr tablet Take 2 tablets (1,200 mg total) by mouth 2 (two) times daily.    levofloxacin (LEVAQUIN) 750 MG tablet Take 1 tablet (750 mg total) by mouth daily.   Comments: Discontinue  after 29th of December 2012 dose.    methocarbamol (ROBAXIN) 500 MG tablet Take 1 tablet (500 mg total) by mouth every 8 (eight) hours as needed (Muscle spasms.).    Multiple Vitamin (MULITIVITAMIN WITH MINERALS) TABS Take 1 tablet by mouth daily.    oxyCODONE (OXY IR/ROXICODONE) 5 MG immediate release tablet Take 1 tablet (5 mg total) by mouth every 4 (four) hours as needed. for moderate pain. Qty: 20 tablet, Refills: 0    polyethylene glycol (MIRALAX / GLYCOLAX) packet Take 17 g by mouth daily.    potassium chloride SA (K-DUR,KLOR-CON) 20 MEQ tablet Take 2 tablets (40 mEq total) by mouth 2 (two) times daily.    rivaroxaban (XARELTO) 10 MG TABS tablet Take 1 tablet (10 mg total) by mouth daily.    senna (SENOKOT) 8.6 MG TABS Take 2 tablets (17.2 mg total) by mouth at bedtime.      CONTINUE these medications which have CHANGED   Details  carvedilol (COREG) 6.25 MG tablet Take 3 tablets (18.75 mg total) by mouth 2 (two) times daily with a meal.      CONTINUE these medications which have NOT CHANGED   Details  albuterol-ipratropium (COMBIVENT) 18-103 MCG/ACT inhaler Inhale 1 puff into the lungs as directed. 1-2 puffs every 4-6hrs prn cough/SOB Qty: 14.7 g, Refills: 11    colestipol (COLESTID) 1 G tablet Take 1 tablet (1 g total) by mouth 2 (two) times daily. Qty: 60 tablet, Refills: 3   Associated Diagnoses: Diarrhea    dicyclomine (BENTYL) 10  MG capsule Take 10 mg by mouth every 6 (six) hours as needed.      fexofenadine (ALLEGRA) 180 MG tablet Take 180 mg by mouth as needed.      fluticasone (FLONASE) 50 MCG/ACT nasal spray Place 1 spray into the nose 2 (two) times daily as needed.     latanoprost (XALATAN) 0.005 % ophthalmic solution Place 1 drop into both eyes daily.      montelukast (SINGULAIR) 10 MG tablet Take 1 tablet (10 mg total) by mouth at bedtime. Qty: 90 tablet, Refills: 1    omeprazole (PRILOSEC) 20 MG capsule Take 1 capsule (20 mg total) by mouth daily. Qty:  90 capsule, Refills: 1    furosemide (LASIX) 20 MG tablet Take 20 mg by mouth 2 (two) times daily.       STOP taking these medications     losartan (COZAAR) 100 MG tablet Comments:  Reason for Stopping:          Hospital Course: 1. Syncope: This was likely secondary to pneumonia and diarrhea with associated poor oral intake resulting in generalized weakness and? Hypotension.Patient had workup for syncope, including ct brain/carotid duplex/2decho, which were unremarkable. 2. Community acquired left mid lobe pneumonia: Patient was treated with IV antibiotics. Postoperatively she seemed to spike temperature and may have had a aspirating episode intraoperative her antibiotic spectrum was broadened to include vancomycin and Zosyn. She has done well. She's been afebrile he did she has minimal dry cough but no dyspnea. Will complete a total one-week course of Levaquin. Recommend followup chest x-ray in a couple of weeks to insure resolution of chest x-ray findings. 3. Left ankle fracture, status post syncope and fall. Orthopedics was consulted. The performed open reduction and internal fixation on 11/14/2011. She has a splint on the left lower extremity. She is able to wiggle her left toes and the color looks normal. Orthopedic M.D. recommends followup with Dr. Lestine Box in 2 weeks from hospital discharge. Elevate left lower extremity above level of the heart and wiggle toes periodically throughout the day. Xarelto DVT prophylaxis for 6 weeks while non-weightbearing and then discontinue Xarelto. Keep splint clean, dry and intact. Nonweightbearing on the left lower extremity for 6 weeks postop. 4. Transient atrial fibrillation: Patient reverted back to sinus rhythm. Cardiology recommended continuing Coreg and long-term anticoagulation was not recommended by cardiology as they felt that the A. fib was transient and related to ongoing medical and surgical issues including fever. She is to followup with Dr.  Jens Som after she is discharged from the skilled nursing facility. 5. Constipation: Likely multifactorial. Will add MiraLAX to the Colace and Senokot. 6. Anemia: Stable 7. Hypertension: Reasonable control.   Day of Discharge BP 148/83  Pulse 71  Temp(Src) 97.5 F (36.4 C) (Oral)  Resp 20  Ht 5' 5.5" (1.664 m)  Wt 76.5 kg (168 lb 10.4 oz)  BMI 27.64 kg/m2  SpO2 94%  General exam: Patient lying comfortably supine in bed and in no obvious distress.  Respiratory system: Clear. No increased work of breathing.  Cardiovascular system: First and second heart sounds heard, regular. No JVD or murmur. Patient is not on telemetry.  Gastrointestinal system: Abdomen is nondistended, soft and normal bowel sounds heard.  Central nervous system: alert and oriented. No focal neurological deficits  Extremities: Left leg is in splint. Patient able to wiggle toes and the color looked normal Skin: Discrete a few reddish colored pinpoint rashes on the back, possibly fungal etiology.   Results for orders placed  during the hospital encounter of 11/12/11 (from the past 48 hour(s))  CBC     Status: Abnormal   Collection Time   11/17/11  4:40 AM      Component Value Range Comment   WBC 7.8  4.0 - 10.5 (K/uL)    RBC 3.95  3.87 - 5.11 (MIL/uL)    Hemoglobin 11.5 (*) 12.0 - 15.0 (g/dL)    HCT 11.9 (*) 14.7 - 46.0 (%)    MCV 86.3  78.0 - 100.0 (fL)    MCH 29.1  26.0 - 34.0 (pg)    MCHC 33.7  30.0 - 36.0 (g/dL)    RDW 82.9  56.2 - 13.0 (%)    Platelets 291  150 - 400 (K/uL)   BASIC METABOLIC PANEL     Status: Abnormal   Collection Time   11/17/11  4:40 AM      Component Value Range Comment   Sodium 136  135 - 145 (mEq/L)    Potassium 4.5  3.5 - 5.1 (mEq/L)    Chloride 102  96 - 112 (mEq/L)    CO2 23  19 - 32 (mEq/L)    Glucose, Bld 86  70 - 99 (mg/dL)    BUN 12  6 - 23 (mg/dL)    Creatinine, Ser 8.65  0.50 - 1.10 (mg/dL)    Calcium 9.5  8.4 - 10.5 (mg/dL)    GFR calc non Af Amer 66 (*) >90  (mL/min)    GFR calc Af Amer 77 (*) >90 (mL/min)    1. TSH normal 2. Cardiac panel: Cycled and normal 3. Urine culture: Negative 4. Blood cultures: Negative to date 5. 2-D echocardiogram: Showed left ventricle ejection fraction 60-65%. Wall motion was normal and there were no regional wall motion abnormalities. 6. Carotid Dopplers:Summary:No significant extracranial carotid artery stenosis demonstrated. Veterbrals are patent with antegrade flow.   Dg Ankle 2 Views Left  11/13/2011  *RADIOLOGY REPORT*  Clinical Data: Post reduction  LEFT ANKLE - 2 VIEW  Comparison: The left ankle films from 11/12/2011  IMPRESSION: Little change in tibiotalar subluxation with medial and lateral malleolar fractures in better position compared to the initial images.  Original Report Authenticated By: Juline Patch, M.D.   Dg Ankle 2 Views Left  11/12/2011  *RADIOLOGY REPORT*  Clinical Data: Fall with pain and deformity  LEFT ANKLE - 2 VIEW   IMPRESSION: Fracture dislocation of the ankle joint as described.  Original Report Authenticated By: Thomasenia Sales, M.D.   Dg Ankle Complete Left  11/12/2011  *RADIOLOGY REPORT*  Clinical Data: Post reduction  LEFT ANKLE COMPLETE - 3+ VIEW  IMPRESSION: Partial reduction of fracture dislocation of the ankle.  Original Report Authenticated By: Camelia Phenes, M.D.   Ct Head Wo Contrast  11/12/2011  *RADIOLOGY REPORT*  Clinical Data:  75 year old female with syncope.  Fall at 4:00 a.m. today.  CT HEAD WITHOUT CONTRAST CT CERVICAL SPINE WITHOUT CONTRAST  .CT HEAD  IMPRESSION:  Normal noncontrast CT appearance of the brain for age.  CT CERVICAL SPINE  IMPRESSION: No acute fracture or listhesis identified in the cervical spine. Ligamentous injury is not excluded.  Advanced facet degeneration.  Original Report Authenticated By: Harley Hallmark, M.D.    Dg Chest Port 1 View  11/15/2011  *RADIOLOGY REPORT*  Clinical Data: Cough, asthma, fever  PORTABLE CHEST - 1 VIEW   Comparison: 11/12/2011  IMPRESSION:  1.  Worsening left lung airspace disease.  Original Report Authenticated By: Thora Lance  III, M.D.   Dg Chest Port 1 View  11/12/2011  *RADIOLOGY REPORT*  Clinical Data: Patient fell today, cough, congestion  PORTABLE CHEST - IMPRESSION:  1.  Opacity in the left midlung may represent pneumonia, but recommend follow-up chest x-ray to exclude neoplasm. 2.  Stable cardiomegaly.  Original Report Authenticated By: Juline Patch, M.D.   Dg Knee Complete 4 Views Left  11/12/2011  *RADIOLOGY REPORT*  Clinical Data: Fall  LEFT KNEE - COMPLETE 4+ VIEW  Comparison: None.  IMPRESSION: Negative for fracture.  Original Report Authenticated By: Camelia Phenes, M.D.     Disposition: Discharge to skilled nursing facility in stable condition  Diet: Heart healthy  Activity: Nonweightbearing on left lower extremity for 6 weeks postop.  Follow-up Appts: Discharge Orders    Future Orders Please Complete By Expires   Diet - low sodium heart healthy      Increase activity slowly      Discharge instructions      Comments:   Please see orthopedic MDs note for left lower extremity weightbearing and activity status. Patient is supposed to be nonweightbearing on the left lower extremity for 6 weeks postop.   Call MD for:  temperature >100.4      Call MD for:  severe uncontrolled pain      Call MD for:  redness, tenderness, or signs of infection (pain, swelling, redness, odor or green/yellow discharge around incision site)      Call MD for:  difficulty breathing, headache or visual disturbances         TESTS THAT NEED FOLLOW-UP Chest x-ray in a few weeks, to insure resolution of left mid lobe pneumonia the  Time spent on discharge, talking to the patient, and coordinating care: 60 mins.   SignedMarcellus Scott, MD 11/18/2011, 11:59 AM

## 2011-11-18 NOTE — Progress Notes (Signed)
Patient set to discharge to [facility] (SNF) today. Facility & family aware. [facility] called for transportation.   Unice Bailey, LCSWA [phone]

## 2011-11-18 NOTE — Plan of Care (Signed)
Problem: Phase III Progression Outcomes Goal: Activity at appropriate level-compared to baseline (UP IN CHAIR FOR HEMODIALYSIS)  Outcome: Progressing Pt is NWB to LLE per MD orders. Pt will be d/ced to SNF for rehab.

## 2011-11-18 NOTE — Progress Notes (Signed)
Subjective:  Doing well this am. No chest pain or palpitations.  Cough is improved.  Objective:  Vital Signs in the last 24 hours: Temp:  [97.5 F (36.4 C)-99.5 F (37.5 C)] 97.5 F (36.4 C) (12/27 0625) Pulse Rate:  [67-72] 71  (12/27 0625) Resp:  [18-20] 20  (12/27 0625) BP: (96-148)/(69-83) 148/83 mmHg (12/27 0625) SpO2:  [94 %-97 %] 94 % (12/27 0625)  Intake/Output from previous day: 12/26 0701 - 12/27 0700 In: 650 [P.O.:600; IV Piggyback:50] Out: 3450 [Urine:3450] Intake/Output from this shift:       . aspirin EC  81 mg Oral Daily  . carvedilol  18.75 mg Oral BID WC  . colestipol  1 g Oral BID  . diltiazem  5 mg Intravenous Once  . docusate sodium  100 mg Oral BID  . guaiFENesin  1,200 mg Oral BID  . latanoprost  1 drop Both Eyes QHS  . levofloxacin (LEVAQUIN) IV  750 mg Intravenous Q24H  . loratadine  10 mg Oral Daily  . montelukast  10 mg Oral QHS  . mulitivitamin with minerals  1 tablet Oral Daily  . pantoprazole  40 mg Oral Q1200  . piperacillin-tazobactam (ZOSYN)  IV  3.375 g Intravenous Q8H  . potassium chloride  40 mEq Oral BID  . rivaroxaban  10 mg Oral Daily  . senna  1 tablet Oral BID      Physical Exam: The patient appears to be in no distress.  Head and neck exam reveals that the pupils are equal and reactive.  The extraocular movements are full.  There is no scleral icterus.  Mouth and pharynx are benign.  No lymphadenopathy.  No carotid bruits.  The jugular venous pressure is normal.  Thyroid is not enlarged or tender.  Chest Few basilar rhonchi.  Heart reveals no abnormal lift or heave.  First and second heart sounds are normal.  There is soft systolic murmur at base. No gallop. No rub.  The abdomen is soft and nontender.  Bowel sounds are normoactive.  There is no hepatosplenomegaly or mass.  There are no abdominal bruits.    Neurologic exam is normal strength and no lateralizing weakness.  No sensory deficits.  Integument reveals no  rash  Lab Results:  [facility] 11/17/11 0440  WBC 7.8  HGB 11.5*  PLT 291    Basename 11/17/11 0440  NA 136  K 4.5  CL 102  CO2 23  GLUCOSE 86  BUN 12  CREATININE 0.85    Basename 11/15/11 0845  TROPONINI <0.30   Hepatic Function Panel No results found for this basename: PROT,ALBUMIN,AST,ALT,ALKPHOS,BILITOT,BILIDIR,IBILI in the last 72 hours No results found for this basename: CHOL in the last 72 hours No results found for this basename: PROTIME in the last 72 hours  Imaging: [facility]* *[facility]* [address] [place], [place] [address] [phone]  ------------------------------------------------------------ Transthoracic Echocardiography  Patient: Valerie Rollins, Valerie Rollins MR #: [id number] Study Date: 11/14/2011 Gender: F Age: 75 Height: 165.1cm Weight: 76.2kg BSA: 1.79m^2 Pt. Status: Room: 1443  SONOGRAPHER Ellin Goodie, RDCS PERFORMING [facility] ADMITTING Bonnie, Simbiso ATTENDING Ranga, Simbiso ORDERING Ranga, Simbiso REFERRING Ranga, Simbiso SONOGRAPHER Georgian Co, RDCS, CCT cc:  ------------------------------------------------------------ LV EF: 60% - 65%  ------------------------------------------------------------ Indications: 780.2 Syncope.  ------------------------------------------------------------ History: PMH: Right bundle branch block on electrocardiogram. Systolic heart murmur. Syncope which caused a fall and left ankle fracture. Pneumonia. Syncope and murmur. Risk factors: Hypertension. Dyslipidemia.  ------------------------------------------------------------ Study Conclusions  - Left  ventricle: The cavity size was normal. Wall thickness was normal. Systolic function was normal. The estimated ejection fraction was in the range of 60% to 65%. Wall motion was normal; there were no regional wall motion abnormalities. Doppler parameters are consistent with abnormal left ventricular  relaxation (grade 1 diastolic dysfunction). - Mitral valve: Calcified annulus. - Left atrium: The atrium was mildly dilated. - Right ventricle: The cavity size was mildly dilated. - Right atrium: The atrium was mildly to moderately dilated. - Pulmonary arteries: Systolic pressure was moderately increased. PA peak pressure: 52mm Hg (S).    Cardiac Studies: No new studies. Assessment/Plan:  * Atrial fibrillation (11/15/2011)   Assessment: Remains in NSR   Plan: Continue present carvedilol.    Probable transfer to SNF today. Will sign off now.    She will followup with Dr. Jens Som for cardiolgy after she gets home from SNF.             LOS: 6 days    Cassell Clement 11/18/2011, 8:14 AM

## 2011-11-19 LAB — CULTURE, BLOOD (ROUTINE X 2)
Culture  Setup Time: 201212221133
Culture  Setup Time: 201212221133
Culture: NO GROWTH
Special Requests: NORMAL

## 2011-11-21 LAB — CULTURE, BLOOD (ROUTINE X 2): Culture  Setup Time: 201212240230

## 2011-12-02 ENCOUNTER — Telehealth: Payer: Self-pay | Admitting: Internal Medicine

## 2011-12-02 NOTE — Telephone Encounter (Signed)
Patients daughter Waynetta Sandy called and would like to know patients drug allergies? Daughter is with patient in case she is not on DPI.

## 2011-12-02 NOTE — Telephone Encounter (Signed)
LMOM to inform patient that I am mailing copy of drug Allergies and request call back if needing earlier.

## 2011-12-15 ENCOUNTER — Encounter: Payer: Self-pay | Admitting: Internal Medicine

## 2011-12-15 ENCOUNTER — Ambulatory Visit (INDEPENDENT_AMBULATORY_CARE_PROVIDER_SITE_OTHER): Payer: Medicare Other | Admitting: Internal Medicine

## 2011-12-15 VITALS — BP 116/70 | HR 58 | Temp 98.5°F

## 2011-12-15 DIAGNOSIS — R9389 Abnormal findings on diagnostic imaging of other specified body structures: Secondary | ICD-10-CM

## 2011-12-15 DIAGNOSIS — D649 Anemia, unspecified: Secondary | ICD-10-CM

## 2011-12-15 DIAGNOSIS — R931 Abnormal findings on diagnostic imaging of heart and coronary circulation: Secondary | ICD-10-CM

## 2011-12-15 DIAGNOSIS — R55 Syncope and collapse: Secondary | ICD-10-CM

## 2011-12-15 LAB — CBC WITH DIFFERENTIAL/PLATELET
Eosinophils Relative: 1.4 % (ref 0.0–5.0)
HCT: 40.7 % (ref 36.0–46.0)
Hemoglobin: 13.9 g/dL (ref 12.0–15.0)
Lymphs Abs: 1.5 10*3/uL (ref 0.7–4.0)
MCV: 89.5 fl (ref 78.0–100.0)
Monocytes Absolute: 0.4 10*3/uL (ref 0.1–1.0)
Neutro Abs: 4.1 10*3/uL (ref 1.4–7.7)
Platelets: 218 10*3/uL (ref 150.0–400.0)
RDW: 14.9 % — ABNORMAL HIGH (ref 11.5–14.6)
WBC: 6.1 10*3/uL (ref 4.5–10.5)

## 2011-12-15 LAB — BASIC METABOLIC PANEL
Calcium: 10 mg/dL (ref 8.4–10.5)
Creatinine, Ser: 0.7 mg/dL (ref 0.4–1.2)
GFR: 90.06 mL/min (ref >60.00)
Glucose, Bld: 104 mg/dL — ABNORMAL HIGH (ref 70–99)
Sodium: 143 mEq/L (ref 135–145)

## 2011-12-15 MED ORDER — CARVEDILOL 12.5 MG PO TABS: ORAL_TABLET | ORAL | Status: DC

## 2011-12-15 NOTE — Patient Instructions (Signed)
.  Share results with MDs seen

## 2011-12-15 NOTE — Progress Notes (Signed)
Subjective:    Patient ID: Valerie Rollins, female    DOB: 05/11/1936, 76 y.o.   MRN: [id number]  HPI Her complicated admission records from December were reviewed. She experienced syncope in the context of community acquired pneumonia with dehydration & poor intake due to anorexia. She had been coughing and had some diarrhea prior to the event. She denies any cardiac or neurologic prodrome prior to the syncope. She specifically denied chest pain or change in rhythm and rate. She also denied headache,asymmetric weakness, or numbness and tingling.  She also sustained  ankle fracture involving multiple bones.Surgical correction was completed 12/23 by  Dr. Lestine Box. Carotid Dopplers were negative. Echocardiogram suggested moderate increase in pulmonary artery pressures with left atrial mild enlargement & mild - mod RA dilation.Grade 1 diastolic dysfunction. At the time of discharge significant values included a GFR of 66 and hematocrit of 34.1. Serial cardiac enzymes had been negative.  She has a past medical history deep venous thrombosis in 2000 with no predisposition. She is presently on Xarelto to 10 mg daily    Review of Systems   She states she is asymptomatic at present; only symptoms relate to the ankle fractures with residual pain     Objective:   Physical Exam Gen.:  well-nourished in appearance. Alert, appropriate and cooperative throughout exam. Head: Normocephalic without obvious abnormalities  Eyes: No corneal or conjunctival inflammation noted.  Extraocular motion intact. Vision grossly normal. Neck: No deformities, masses, or tenderness noted. Range of motion  normal Lungs: Normal respiratory effort; chest expands symmetrically. Lungs are clear to auscultation without rales, wheezes, or increased work of breathing. Heart: Normal rate and rhythm. Normal S1 and S2. No gallop, click, or rub. Grade 1/6 systolic murmur R base. Abdomen: Bowel sounds normal; abdomen soft and nontender. No  masses, organomegaly or hernias noted.                                                                                   Musculoskeletal/extremities:  No clubbing, cyanosis, edema. LLE in walking boot.  Nail health  good. Vascular: Carotid, radial artery, R dorsalis pedis and  R posterior tibial pulses are full and equal. No bruits present. Neurologic: Alert and oriented x3. Deep tendon reflexes symmetrical and normal. LLE not tested        Skin: Intact without suspicious lesions or rashes. Lymph: No cervical, axillary lymphadenopathy present. Psych: Mood and affect are normal. Normally interactive                                                                                         Assessment & Plan:  #1 syncope in the context of community acquired pneumonia with dehydration and poor oral intake  #2 systolic murmur right base suggestive of aortic stenosis. This is not supported by the echocardiogram report. Left ventricular  function is normal. She does have grade 1 diastolic dysfunction. She has mild dilation of the left atrium; mild dilation of the right ventricle; mild to moderate dilation of the right atrium. There is moderate increase in the systolic pressure in the pulmonary arteries.  Plan: CBC and BMET will be checked. Followup with Dr. Jens Som will be scheduled to reassess the echocardiogram  findings.    A coagulopathy profile could be considered once she is off the Xarelto

## 2011-12-21 ENCOUNTER — Telehealth: Payer: Self-pay

## 2011-12-21 NOTE — Telephone Encounter (Signed)
Discuss with patient  

## 2011-12-21 NOTE — Telephone Encounter (Signed)
Stop the 50,000 unit dose. Take vitamin D 3 over-the-counter 1000 international units 2 pills every day. Check vitamin D level in 4 months (code 268.9)

## 2011-12-21 NOTE — Telephone Encounter (Signed)
Message left on voicemail: Patient would like to know if she is to continue Vit D 50,000 twice weekly as prescribed while she was in nursing home, if yes needs rx sent to local pharmacy  Patient would also like to know if she is to continue taking Robaxin   Dr.Hopper please advise

## 2011-12-21 NOTE — Telephone Encounter (Signed)
Please advise Patient would also like to know if she is to continue taking Robaxin

## 2011-12-22 NOTE — Telephone Encounter (Signed)
Discuss with patient  

## 2011-12-22 NOTE — Telephone Encounter (Signed)
Robaxin should be taken as infrequently as possible; it can affect alertness & balance

## 2011-12-31 ENCOUNTER — Institutional Professional Consult (permissible substitution): Payer: Medicare Other | Admitting: Cardiovascular Disease

## 2012-01-17 ENCOUNTER — Ambulatory Visit (INDEPENDENT_AMBULATORY_CARE_PROVIDER_SITE_OTHER): Payer: Medicare Other | Admitting: Cardiology

## 2012-01-17 ENCOUNTER — Encounter: Payer: Self-pay | Admitting: Cardiology

## 2012-01-17 DIAGNOSIS — R55 Syncope and collapse: Secondary | ICD-10-CM

## 2012-01-17 DIAGNOSIS — I4891 Unspecified atrial fibrillation: Secondary | ICD-10-CM

## 2012-01-17 DIAGNOSIS — I10 Essential (primary) hypertension: Secondary | ICD-10-CM

## 2012-01-17 DIAGNOSIS — R011 Cardiac murmur, unspecified: Secondary | ICD-10-CM

## 2012-01-17 NOTE — Progress Notes (Signed)
HPI: 76 year old female for followup of atrial fibrillation. Patient was admitted on December 21 after a syncopal episode in the setting of pneumonia. She suffered a fracture of her left ankle. She underwent repair on December 23. She developed postoperative atrial fibrillation on December 24 and we were consulted. An echocardiogram revealed normal LV function, grade 1 diastolic dysfunction, mild left atrial enlargement, mild right ventricular enlargement and mild to moderate right atrial enlargement. TSH normal. Patient has improved since discharge. She still has a cast on her left lower extremity. However she denies dyspnea, chest pain, palpitations or recurrent syncope.  Current Outpatient Prescriptions  Medication Sig Dispense Refill  . albuterol-ipratropium (COMBIVENT) 18-103 MCG/ACT inhaler Inhale 1 puff into the lungs as directed. 1-2 puffs every 4-6hrs prn cough/SOB  14.7 g  11  . aspirin EC 81 MG EC tablet Take 1 tablet (81 mg total) by mouth daily.      . carvedilol (COREG) 12.5 MG tablet 1& 1/2 bid  90 tablet  1  . colestipol (COLESTID) 1 G tablet Take 1 tablet (1 g total) by mouth 2 (two) times daily.  60 tablet  3  . dicyclomine (BENTYL) 10 MG capsule Take 10 mg by mouth every 6 (six) hours as needed.        . fexofenadine (ALLEGRA) 180 MG tablet Take 180 mg by mouth as needed.       . fluticasone (FLONASE) 50 MCG/ACT nasal spray Place 1 spray into the nose 2 (two) times daily as needed.       . furosemide (LASIX) 20 MG tablet Take 20 mg by mouth 2 (two) times daily.       Marland Kitchen latanoprost (XALATAN) 0.005 % ophthalmic solution Place 1 drop into both eyes daily.        . methocarbamol (ROBAXIN) 500 MG tablet Take 500 mg by mouth every 8 (eight) hours as needed.      . montelukast (SINGULAIR) 10 MG tablet Take 1 tablet (10 mg total) by mouth at bedtime.  90 tablet  1  . Multiple Vitamin (MULITIVITAMIN WITH MINERALS) TABS Take 1 tablet by mouth daily.      Marland Kitchen omeprazole (PRILOSEC) 20 MG  capsule Take 1 capsule (20 mg total) by mouth daily.  90 capsule  1  . oxyCODONE (OXY IR/ROXICODONE) 5 MG immediate release tablet Take 5 mg by mouth every 4 (four) hours as needed.      . Polyethylene Glycol 3350 (MIRALAX PO) Take by mouth as needed.      . potassium chloride SA (K-DUR,KLOR-CON) 20 MEQ tablet Take 2 tablets (40 mEq total) by mouth 2 (two) times daily.      . rivaroxaban (XARELTO) 10 MG TABS tablet Take 1 tablet (10 mg total) by mouth daily.      Marland Kitchen senna (SENOKOT) 8.6 MG TABS Take 2 tablets (17.2 mg total) by mouth at bedtime.      . Vitamin D, Ergocalciferol, (DRISDOL) 50000 UNITS CAPS Take 50,000 Units by mouth. 1 by mouth 2 x weekly until Dec 22, 2011, then 1 by mouth once weekly         Past Medical History  Diagnosis Date  . Diverticulitis 2002    based on CT scan  . Hyperlipidemia   . Hypertension   . Superficial phlebitis     x2, 1999, 2000  . Ocular hypertension     glaucoma suspect, Dr. Dagoberto Ligas  . Skin cancer   . Endometriosis   . Gastritis 2006    @  Endo  . Hiatal hernia   . Diverticulosis   . GERD (gastroesophageal reflux disease)   . DVT (deep venous thrombosis) 2000    no trigger  . Syncope 11/12/11    in context CAP . Carotid Doppler exam was negative. 2D echocardiogram revealed mild dilation of the left atrium and moderate increase in the systolic pressureof  the pulmonary artery  . Atrial fibrillation     Post op after repair of ankle fracture    Past Surgical History  Procedure Date  . Total abdominal hysterectomy w/ bilateral salpingoophorectomy 1980    dysfunctional menses, endometriosis  . Varicose vein surgery     1960, 1965, 2009  . Tonsillectomy   . Cataract extraction w/ intraocular lens implant     OS  . Colonoscopy 1995, 2002    diverticulosis  . Orif ankle fracture 11/14/2011    Procedure: OPEN REDUCTION INTERNAL FIXATION (ORIF) ANKLE FRACTURE;  Surgeon: Sherri Rad;  Location: [facility];  Service: Orthopedics;   Laterality: Left;  open reduction internal fixation trimalleolar ankle fracture    History   Social History  . Marital Status: Married    Spouse Name: N/A    Number of Children: 2  . Years of Education: N/A   Occupational History  . Retired    Social History Main Topics  . Smoking status: Never Smoker   . Smokeless tobacco: Never Used  . Alcohol Use: No  . Drug Use: No  . Sexually Active: Not on file   Other Topics Concern  . Not on file   Social History Narrative   REG EXERCISEHAD COLONOSCOPY AND ENDOSCOPY DONE DATES UNKNOWN    ROS: no fevers or chills, productive cough, hemoptysis, dysphasia, odynophagia, melena, hematochezia, dysuria, hematuria, rash, seizure activity, orthopnea, PND, pedal edema, claudication. Remaining systems are negative.  Physical Exam: Well-developed well-nourished in no acute distress.  Skin is warm and dry.  HEENT is normal.  Neck is supple. No thyromegaly.  Chest is clear to auscultation with normal expansion.  Cardiovascular exam is regular rate and rhythm. 2/6 systolic murmur left sternal border. S2 is not diminished. Abdominal exam nontender or distended. No masses palpated. Extremities show left lower extremity in cast neuro grossly intact  ECG sinus rhythm at a rate of 55. Occasional PAC. Right bundle branch block.

## 2012-01-17 NOTE — Assessment & Plan Note (Signed)
Recent echocardiogram showed no significant valve disease.

## 2012-01-17 NOTE — Assessment & Plan Note (Signed)
Blood pressure controlled. Continue present medications. 

## 2012-01-17 NOTE — Assessment & Plan Note (Signed)
This occurred in the setting of pneumonia and decreased by mouth intake. She has had no further episodes and LV function normal. Most likely secondary to dehydration.

## 2012-01-17 NOTE — Assessment & Plan Note (Signed)
Patient had post operative atrial fibrillation transiently. She converted spontaneously to sinus rhythm. Echocardiogram showed normal LV function and normal TSH. She remains in sinus. Continue present medications. I do not think she requires long-term Coumadin atrial fibrillation occurred postoperatively.

## 2012-01-24 NOTE — Progress Notes (Signed)
Addended by: Judithe Modest D on: 01/24/2012 10:08 AM   Modules accepted: Orders

## 2012-02-01 ENCOUNTER — Other Ambulatory Visit: Payer: Self-pay | Admitting: Internal Medicine

## 2012-02-01 NOTE — Telephone Encounter (Signed)
Prescription sent to pharmacy.

## 2012-02-24 ENCOUNTER — Other Ambulatory Visit: Payer: Self-pay | Admitting: Internal Medicine

## 2012-03-03 ENCOUNTER — Ambulatory Visit (INDEPENDENT_AMBULATORY_CARE_PROVIDER_SITE_OTHER): Payer: Medicare Other | Admitting: Internal Medicine

## 2012-03-03 ENCOUNTER — Encounter: Payer: Self-pay | Admitting: Internal Medicine

## 2012-03-03 VITALS — BP 148/86 | HR 59 | Temp 98.1°F | Wt 171.0 lb

## 2012-03-03 DIAGNOSIS — I10 Essential (primary) hypertension: Secondary | ICD-10-CM

## 2012-03-03 MED ORDER — CARVEDILOL 25 MG PO TABS: ORAL_TABLET | ORAL | Status: DC

## 2012-03-03 NOTE — Assessment & Plan Note (Signed)
Carvedilol will be titrated for blood pressure control. Maximum dose with 25 mg twice a day

## 2012-03-03 NOTE — Progress Notes (Signed)
  Subjective:    Patient ID: Valerie Rollins, female    DOB: 1936/11/11, 76 y.o.   MRN: [id number]  HPI Blood pressure was markedly elevated and her orthopedic appointment yesterday. Initial blood pressure was 167/73; recheck was 180/80.  Yesterday afternoon after resting at home her blood pressure was 114/68 with her cuff.  Her cuff was checked against ours here in the office and actually was slightly higher.    Review of Systems Disease Monitoring  Blood pressure range: not monitored  Chest pain: no  Dyspnea: no  Claudication: no  Medication compliance:yes  Medication Side Effects  Lightheadedness: no  Urinary frequency: from diuretic   Edema: Her orthopedist states that he expects edema the left lower extremity for up to 6 months  Preventitive Healthcare:  Exercise: minimal  Diet Pattern: no plan  Salt Restriction: modified but eating restaurant food       Objective:   Physical Exam She appears healthy and well-nourished; she is in no acute distress  No carotid bruits are present.  Heart rhythm and rate are normal with grade 1-1.5 R base systolic  significant murmurs . No gallops.  Chest is clear with no increased work of breathing  There is no evidence of aortic aneurysm or renal artery bruits  She has no clubbing . Trace edema on the right; 1/2+ on the left. Left pleural of the left ankle and foot. Some lipedema as well.   Pedal pulses are present but decreased   No ischemic skin changes are present         Assessment & Plan:

## 2012-03-03 NOTE — Patient Instructions (Signed)
Your BP goal = AVERAGE < 135/85. Avoid ingestion of  excess salt/sodium.Cook with pepper & other spices . Use the salt substitute "No Salt"(unless your potassium has been elevated) OR the Mrs Dash products to season food @ the table. Avoid foods which taste salty or "vinegary" as their sodium contentet will be high.  

## 2012-03-21 ENCOUNTER — Other Ambulatory Visit: Payer: Self-pay | Admitting: Internal Medicine

## 2012-03-21 NOTE — Telephone Encounter (Signed)
Refill done.  

## 2012-04-04 ENCOUNTER — Other Ambulatory Visit: Payer: Self-pay | Admitting: Internal Medicine

## 2012-04-19 ENCOUNTER — Other Ambulatory Visit: Payer: Self-pay | Admitting: Internal Medicine

## 2012-04-19 ENCOUNTER — Telehealth: Payer: Self-pay | Admitting: Internal Medicine

## 2012-04-19 DIAGNOSIS — M8580 Other specified disorders of bone density and structure, unspecified site: Secondary | ICD-10-CM

## 2012-04-19 NOTE — Telephone Encounter (Signed)
Patient states she is to have her Vitamin D levels monitored and is due to have it checked. Can you review and advise and put orders in & I will call patient back to schedule labs.  Patient ph# [phone]/cell [phone]

## 2012-04-19 NOTE — Telephone Encounter (Signed)
Patient scheduled for 10:15am 5.30.13

## 2012-04-19 NOTE — Telephone Encounter (Signed)
Vit D level; Osteopenia

## 2012-04-19 NOTE — Telephone Encounter (Signed)
Dr.Hopper please advise on DX

## 2012-04-20 ENCOUNTER — Other Ambulatory Visit: Payer: Medicare Other

## 2012-04-20 ENCOUNTER — Other Ambulatory Visit (INDEPENDENT_AMBULATORY_CARE_PROVIDER_SITE_OTHER): Payer: Medicare Other

## 2012-04-20 DIAGNOSIS — M949 Disorder of cartilage, unspecified: Secondary | ICD-10-CM

## 2012-04-20 DIAGNOSIS — M899 Disorder of bone, unspecified: Secondary | ICD-10-CM

## 2012-04-20 DIAGNOSIS — M8580 Other specified disorders of bone density and structure, unspecified site: Secondary | ICD-10-CM

## 2012-04-20 NOTE — Progress Notes (Signed)
Labs only

## 2012-05-01 ENCOUNTER — Encounter: Payer: Self-pay | Admitting: Internal Medicine

## 2012-05-01 ENCOUNTER — Ambulatory Visit (INDEPENDENT_AMBULATORY_CARE_PROVIDER_SITE_OTHER): Payer: Medicare Other | Admitting: Internal Medicine

## 2012-05-01 VITALS — BP 138/82 | HR 55 | Temp 98.6°F | Wt 174.0 lb

## 2012-05-01 DIAGNOSIS — M545 Low back pain, unspecified: Secondary | ICD-10-CM

## 2012-05-01 DIAGNOSIS — J45909 Unspecified asthma, uncomplicated: Secondary | ICD-10-CM

## 2012-05-01 MED ORDER — BECLOMETHASONE DIPROPIONATE 80 MCG/ACT IN AERS: 1.0000 | INHALATION_SPRAY | Freq: Two times a day (BID) | RESPIRATORY_TRACT | Status: DC

## 2012-05-01 MED ORDER — TRAMADOL HCL 50 MG PO TABS: 50.0000 mg | ORAL_TABLET | Freq: Four times a day (QID) | ORAL | Status: AC | PRN

## 2012-05-01 NOTE — Progress Notes (Signed)
Subjective:    Patient ID: Valerie Rollins, female    DOB: 1936-02-15, 76 y.o.   MRN: [id number]  HPI  Her back pain began approximately 2 weeks ago manifested as a sharp "catching" phenomena lasting seconds in the lower thoracic spine area bilaterally. There was no trauma or injury and she has no past history of significant low  back issues. She saw her chiropractor 3 times last week. Exercises and cold packs did not help.  Also of she states her daughter has noted increased wheezing over phone    Review of Systems Fecal/urinary incontinence: no Numbness/Weakness: no Fever/chills/sweats: sweaty this am Night pain: no,relieved after sleep  Unexplained weight loss: no h/o cancer/immunosuppression: only skin cancer PMH of osteoporosis or chronic steroid use: no  She is not had dysuria, hematuria, or pyuria   Cough:croupy , intermittent Sputum production:no Hemoptysis:no Dyspnea (rest/exertional/PND):no Chest pain, edema, palpitations:edema related to fracture Treatment/efficacy: Use of rescue inhaler:Combivent helps Use of maintenance inhaler:no Smoking:never Past medical history: Asthma:no but seasonal  Allergies Family history pulmonary disease: daughter  She is not had signs of sinusitis such as frontal sinus pain, facial pain, nasal purulence, persistent sore throat, earache or otic discharge.       Objective:   Physical Exam Gen.: Healthy and well-nourished in appearance. Alert, appropriate and cooperative throughout exam.  Eyes: No corneal or conjunctival inflammation noted.  Ears: External  ear exam reveals no significant lesions or deformities. Canals clear .TMs normal. Hearing is grossly normal bilaterally. Nose: External nasal exam reveals no deformity or inflammation. Nasal mucosa are pink and moist. No lesions or exudates noted.  Mouth: Oral mucosa and oropharynx reveal no lesions or exudates. Teeth in good repair. Neck: No deformities, masses, or tenderness noted.  Range of motion normal but some discomfort with left lateral neck rotation  Lungs: Normal respiratory effort; chest expands symmetrically. Lungs are clear to auscultation without rales, wheezes, or increased work of breathing. Heart: Normal rate and rhythm. Normal S1 and S2. No gallop, click, or rub. Grade 1/6 systolic murmur  Abdomen: Bowel sounds normal; abdomen soft and nontender. No masses, organomegaly or hernias noted. No flank discomfort to percussion                                                                                  Musculoskeletal/extremities: No deformity or scoliosis noted of  the thoracic or lumbar spine. No clubbing, cyanosis,  deformity noted. Range of motion  normal .Tone & strength  normal.Joints normal. Nail health  good. Asymmetric ankle edematous changes with varicose veins. Negative straight leg raising bilaterally. She lies back and sits up slowly without help Vascular: Carotid, radial artery, dorsalis pedis and  posterior tibial pulses are full and equal. No bruits present. Neurologic: Alert and oriented x3. Deep tendon reflexes symmetrical and normal.  She is limping on the left        Skin: Intact without suspicious lesions or rashes. Lymph: No cervical, axillary lymphadenopathy present. Psych: Mood and affect are normal. Normally interactive  Urinalysis was negative for neutrophils glucose or blood. The pH was  6; specific gravity 1.020.          Assessment & Plan:  #1 acute low back syndrome; no evidence of disc.  #2 subjective report of wheezing; no acute process this time. A prophylactic, maintenance agent would be indicated  Plan: See orders and recommendations

## 2012-05-01 NOTE — Patient Instructions (Addendum)
Use an anti-inflammatory cream such as Aspercreme or Zostrix cream twice a day to the back as needed. In lieu of this warm moist compresses or  hot water bottle can be used. Leave off  Ice. QVAR 1-2  inhalations every 12 hours; gargle and spit after use

## 2012-05-02 ENCOUNTER — Telehealth: Payer: Self-pay | Admitting: Internal Medicine

## 2012-05-02 MED ORDER — HYDROCODONE-ACETAMINOPHEN 5-500 MG PO TABS: 1.0000 | ORAL_TABLET | Freq: Four times a day (QID) | ORAL | Status: AC | PRN

## 2012-05-02 NOTE — Telephone Encounter (Signed)
Vicodin 5/500 mg 1 every 6 hrs prn #30

## 2012-05-02 NOTE — Telephone Encounter (Signed)
RX called to [facility] @ [facility].  Pt is notified.

## 2012-05-02 NOTE — Telephone Encounter (Signed)
Caller: Valerie Rollins/Patient; PCP: Marga Melnick; CB#: [phone];  Call regarding Dr. Alwyn Ren Prescribed  Tramadol HCL 50 Mg for lower R sided Back Pain and It Isn't Helping. Asking If Something Different Could Be Called in To [facility]- [place].Triage refused for back at this time d/t time constraints. She Has Two Appts Today.  Medication Questions Protocol.

## 2012-05-17 ENCOUNTER — Ambulatory Visit (INDEPENDENT_AMBULATORY_CARE_PROVIDER_SITE_OTHER)
Admission: RE | Admit: 2012-05-17 | Discharge: 2012-05-17 | Disposition: A | Discharge status: Home / Self Care | Payer: Medicare Other | Source: Ambulatory Visit | Attending: Internal Medicine | Admitting: Internal Medicine

## 2012-05-17 ENCOUNTER — Encounter: Payer: Self-pay | Admitting: Internal Medicine

## 2012-05-17 ENCOUNTER — Ambulatory Visit (INDEPENDENT_AMBULATORY_CARE_PROVIDER_SITE_OTHER): Payer: Medicare Other | Admitting: Internal Medicine

## 2012-05-17 VITALS — BP 130/84 | HR 56 | Temp 98.0°F | Wt 174.0 lb

## 2012-05-17 DIAGNOSIS — M545 Low back pain, unspecified: Secondary | ICD-10-CM

## 2012-05-17 MED ORDER — CYCLOBENZAPRINE HCL 5 MG PO TABS: ORAL_TABLET | ORAL | Status: DC

## 2012-05-17 NOTE — Patient Instructions (Addendum)
Order for x-rays entered into  the computer; these will be performed at [address] across from [facility]. No appointment is necessary. Please try to go on My Chart within the next 24 hours to allow me to release the results directly to you.  

## 2012-05-17 NOTE — Progress Notes (Signed)
  Subjective:    Patient ID: Valerie Rollins, female    DOB: Mar 25, 1936, 75 y.o.   MRN: [id number]  HPI Initially she was having sharp stabbing pain with movements; for this reason she called in for a stronger medicine than the tramadol. Since then that pain has improved and she is not having to stay on the Vicodin.  She continues to have localized pain in the right lumbosacral area with certain movements. It will radiate superiorly slightly. Fractured L ankle 11/11/12    Review of Systems Fecal/urinary incontinence: no Numbness/Weakness: no Fever/chills/sweats: no  Unexplained weight loss: no Relief with bedrest: yes   PMH of osteoporosis or chronic steroid use: no       Objective:   Physical Exam  She appears healthy and well-nourished in no acute distress  Deep tendon reflexes are all normal   Range of motion; stability; muscle strength; and  muscle tone are normal. Nail health is good. There are  no significant deformities of the digits. No cyanosis, clubbing or edema are present.  She is able to lie flat and sit up without help but she had some pain with sitting up. Straight leg raising is normal bilaterally to 90 degrees. Gait is normal including tiptoe and heel walking. She has some difficulty tiptoeing on the left foot; she has had a fractured left ankle         Assessment & Plan:  #1 low back syndrome, clinically improved. No neuromuscular deficit noted. There is no evidence of disc. Because of the persistence of symptoms; imaging will be pursued. At this time she should return to the chiropractor or consider physical therapy if  images are negative

## 2012-05-19 ENCOUNTER — Telehealth: Payer: Self-pay | Admitting: *Deleted

## 2012-05-19 DIAGNOSIS — M545 Low back pain, unspecified: Secondary | ICD-10-CM

## 2012-05-19 NOTE — Telephone Encounter (Signed)
Schedule MRI of LS spine

## 2012-05-19 NOTE — Telephone Encounter (Signed)
Pt states that the cyclobenzaprine (FLEXERIL) 5 MG  Is not helping with the pain. Pt notes that she has tried it with 1 tab and 2 tab and still has not had any relief from pain. Pt is also using a heating pad with little results..Please advise

## 2012-05-22 NOTE — Telephone Encounter (Signed)
Discuss with patient  

## 2012-05-22 NOTE — Telephone Encounter (Signed)
WO

## 2012-05-22 NOTE — Telephone Encounter (Signed)
Please advise with or without contrast?

## 2012-05-24 ENCOUNTER — Inpatient Hospital Stay (HOSPITAL_COMMUNITY): Admission: RE | Admit: 2012-05-24 | Payer: Medicare Other | Source: Ambulatory Visit

## 2012-05-24 ENCOUNTER — Ambulatory Visit (HOSPITAL_COMMUNITY)
Admission: RE | Admit: 2012-05-24 | Discharge: 2012-05-24 | Disposition: A | Discharge status: Home / Self Care | Payer: Medicare Other | Source: Ambulatory Visit | Attending: Internal Medicine | Admitting: Internal Medicine

## 2012-05-24 ENCOUNTER — Other Ambulatory Visit: Payer: Self-pay | Admitting: Internal Medicine

## 2012-05-24 DIAGNOSIS — M545 Low back pain, unspecified: Secondary | ICD-10-CM

## 2012-05-24 DIAGNOSIS — M25559 Pain in unspecified hip: Secondary | ICD-10-CM | POA: Insufficient documentation

## 2012-05-24 DIAGNOSIS — M48061 Spinal stenosis, lumbar region without neurogenic claudication: Secondary | ICD-10-CM | POA: Insufficient documentation

## 2012-05-24 NOTE — Addendum Note (Signed)
Addended byMarga Melnick F on: 05/24/2012 12:47 PM   Modules accepted: Orders

## 2012-05-24 NOTE — Telephone Encounter (Signed)
MRI facility called and indicated order to be re-entered with Dr.Hopper's electronic sig.

## 2012-05-24 NOTE — Addendum Note (Signed)
Addended by: Maurice Small on: 05/24/2012 12:10 PM   Modules accepted: Orders

## 2012-06-23 ENCOUNTER — Other Ambulatory Visit: Payer: Self-pay | Admitting: Internal Medicine

## 2012-07-20 ENCOUNTER — Encounter: Payer: Self-pay | Admitting: Internal Medicine

## 2012-07-20 ENCOUNTER — Ambulatory Visit (INDEPENDENT_AMBULATORY_CARE_PROVIDER_SITE_OTHER): Payer: Medicare Other | Admitting: Internal Medicine

## 2012-07-20 VITALS — BP 122/80 | HR 49 | Temp 98.5°F | Resp 12 | Ht 65.0 in | Wt 177.4 lb

## 2012-07-20 DIAGNOSIS — Z0000 Encounter for general adult medical examination without abnormal findings: Secondary | ICD-10-CM

## 2012-07-20 DIAGNOSIS — M5136 Other intervertebral disc degeneration, lumbar region: Secondary | ICD-10-CM

## 2012-07-20 DIAGNOSIS — E785 Hyperlipidemia, unspecified: Secondary | ICD-10-CM

## 2012-07-20 DIAGNOSIS — M5137 Other intervertebral disc degeneration, lumbosacral region: Secondary | ICD-10-CM

## 2012-07-20 DIAGNOSIS — I10 Essential (primary) hypertension: Secondary | ICD-10-CM

## 2012-07-20 LAB — HEPATIC FUNCTION PANEL
AST: 24 U/L (ref 0–37)
Albumin: 4.2 g/dL (ref 3.5–5.2)
Total Protein: 7.1 g/dL (ref 6.0–8.3)

## 2012-07-20 LAB — LIPID PANEL
Cholesterol: 211 mg/dL — ABNORMAL HIGH (ref 0–200)
HDL: 77.3 mg/dL (ref >39.00)
Total CHOL/HDL Ratio: 3
VLDL: 34 mg/dL (ref 0.0–40.0)

## 2012-07-20 MED ORDER — MONTELUKAST SODIUM 10 MG PO TABS: ORAL_TABLET | ORAL | Status: DC

## 2012-07-20 MED ORDER — FUROSEMIDE 20 MG PO TABS: ORAL_TABLET | ORAL | Status: DC

## 2012-07-20 NOTE — Patient Instructions (Addendum)
Review and correct the record as indicated. Please share record with all medical staff seen.  

## 2012-07-20 NOTE — Progress Notes (Signed)
Subjective:    Patient ID: Valerie Rollins, female    DOB: Apr 24, 1936, 76 y.o.   MRN: [id number]  HPI Medicare Wellness Visit:  The following psychosocial & medical history were reviewed as required by Medicare.   Social history: caffeine: minimal , alcohol: no ,  tobacco use : never  & exercise : in therapy for back   Home & personal  safety / fall risk: no issues; activities of daily living: no limitation , seatbelt use : yes , and smoke alarm employment :yes.  Power of Attorney/Living Will status :in place  Vision ( as recorded per Nurse) & Hearing  evaluation : Appt with Dr Dagoberto Ligas pending. No hearing evaluation. Orientation :oriented X 3 , memory & recall :good,  math testing: good,and mood & affect : normal . Depression / anxiety: denied Travel history : never , immunization status : Shingles needed , transfusion history:  no, and preventive health surveillance ( colonoscopies, BMD , etc as per protocol/ SOC): no colonoscopy needed in future, Dental care:  Every 6 mos . Chart reviewed &  Updated. Active issues reviewed & addressed.       Review of Systems Colestipol was prescribed for bowel leakage. This has controlled the situation. She has a history of dyslipidemia but no current  Lipids or TSH on record Dr. Ethelene Hal is treating her for degenerative disc disease at multiple levels associated with severe spinal stenosis. His notes were reviewed     Objective:   Physical Exam Gen.: Healthy and well-nourished in appearance. Alert, appropriate and cooperative throughout exam. Head: Normocephalic without obvious abnormalities; hair fine Eyes: No corneal or conjunctival inflammation noted. Pupils equal round reactive to light and accommodation.  Extraocular motion intact. Vision grossly normal with lenses. Ears: External  ear exam reveals no significant lesions or deformities. Canals clear .TMs normal. Hearing is grossly normal bilaterally. Nose: External nasal exam reveals no deformity or  inflammation. Nasal mucosa are pink and moist. No lesions or exudates noted.  Mouth: Oral mucosa and oropharynx reveal no lesions or exudates. Teeth in good repair. Neck: No deformities, masses, or tenderness noted. Range of motion &  Thyroid normal Lungs: Normal respiratory effort; chest expands symmetrically. Lungs are clear to auscultation without rales, wheezes, or increased work of breathing.  Breast exam: Mild fibrocystic changes; no significant nodules Heart: Normal rate and rhythm. Normal S1 and S2. No gallop, click, or rub. Grade 1.5-2 /6 systolic murmur  R base Abdomen: Bowel sounds normal; abdomen soft and nontender. No masses, organomegaly or hernias noted. Genitalia: Dr Senaida Ores Musculoskeletal/extremities: Minimal lordosis  noted of  the thoracic  spine.There is some asymmetry of the posterior thoracic musculature suggesting occult scoliosis.  No clubbing or cyanosis.Fusiform L ankle deformity noted. Tone & strength  normal.Joints normal. Nail health  good. Vascular: Carotid, radial artery,  and  posterior tibial pulses are full and equal. No bruits present.DPP slightly decreased. Tortuous varicose veins left lower extremity greater than right. These empty completely when supine. Neurologic: Alert and oriented x3. Deep tendon reflexes symmetrical and normal.          Skin: Intact without suspicious lesions or rashes. Lymph: No cervical, axillary lymphadenopathy present. Psych: Mood and affect are normal. Normally interactive  Assessment & Plan:  #1 Medicare Wellness Exam; criteria met ; data entered #2 Problem List reviewed ; Assessment/ Recommendations made Plan: see Orders

## 2012-08-31 ENCOUNTER — Ambulatory Visit (INDEPENDENT_AMBULATORY_CARE_PROVIDER_SITE_OTHER): Payer: Medicare Other

## 2012-08-31 ENCOUNTER — Other Ambulatory Visit: Payer: Self-pay

## 2012-08-31 DIAGNOSIS — Z23 Encounter for immunization: Secondary | ICD-10-CM

## 2012-08-31 MED ORDER — ZOSTER VACCINE LIVE 19400 UNT/0.65ML ~~LOC~~ SOLR: 0.6500 mL | Freq: Once | SUBCUTANEOUS | Status: DC

## 2012-09-07 ENCOUNTER — Other Ambulatory Visit: Payer: Self-pay | Admitting: Internal Medicine

## 2012-09-15 ENCOUNTER — Other Ambulatory Visit: Payer: Self-pay | Admitting: Internal Medicine

## 2012-11-23 ENCOUNTER — Encounter: Payer: Self-pay | Admitting: Internal Medicine

## 2012-12-21 ENCOUNTER — Other Ambulatory Visit: Payer: Self-pay | Admitting: Internal Medicine

## 2012-12-29 ENCOUNTER — Other Ambulatory Visit: Payer: Self-pay | Admitting: Internal Medicine

## 2013-01-01 ENCOUNTER — Telehealth: Payer: Self-pay | Admitting: Internal Medicine

## 2013-01-01 MED ORDER — IPRATROPIUM-ALBUTEROL 20-100 MCG/ACT IN AERS: 1.0000 | INHALATION_SPRAY | Freq: Four times a day (QID) | RESPIRATORY_TRACT | Status: DC

## 2013-01-01 NOTE — Telephone Encounter (Signed)
per hand wrt note from Pharmacy - can they get a new rx for Respimat & new signature?

## 2013-01-01 NOTE — Telephone Encounter (Signed)
Hopp please advise on requested rx (Respimat), this is not on patient's medication list

## 2013-01-01 NOTE — Telephone Encounter (Signed)
This replaces Combivent ; OK as per package insert

## 2013-01-20 DIAGNOSIS — N390 Urinary tract infection, site not specified: Secondary | ICD-10-CM

## 2013-01-20 HISTORY — DX: Urinary tract infection, site not specified: N39.0

## 2013-01-25 ENCOUNTER — Other Ambulatory Visit: Payer: Self-pay | Admitting: Internal Medicine

## 2013-02-15 ENCOUNTER — Encounter: Payer: Self-pay | Admitting: Internal Medicine

## 2013-02-15 ENCOUNTER — Ambulatory Visit (INDEPENDENT_AMBULATORY_CARE_PROVIDER_SITE_OTHER): Payer: Medicare Other | Admitting: Internal Medicine

## 2013-02-15 VITALS — BP 130/84 | HR 62 | Temp 97.8°F | Wt 182.6 lb

## 2013-02-15 DIAGNOSIS — R300 Dysuria: Secondary | ICD-10-CM

## 2013-02-15 DIAGNOSIS — N390 Urinary tract infection, site not specified: Secondary | ICD-10-CM

## 2013-02-15 DIAGNOSIS — R82998 Other abnormal findings in urine: Secondary | ICD-10-CM

## 2013-02-15 DIAGNOSIS — R8290 Unspecified abnormal findings in urine: Secondary | ICD-10-CM

## 2013-02-15 LAB — POCT URINALYSIS DIPSTICK
Bilirubin, UA: NEGATIVE
Blood, UA: NEGATIVE
Ketones, UA: NEGATIVE
Spec Grav, UA: 1.015
pH, UA: 6

## 2013-02-15 MED ORDER — PHENAZOPYRIDINE HCL 200 MG PO TABS: 200.0000 mg | ORAL_TABLET | Freq: Three times a day (TID) | ORAL | Status: DC | PRN

## 2013-02-15 NOTE — Progress Notes (Signed)
  Subjective:    Patient ID: Valerie Rollins, female    DOB: [birth year], 77 y.o.   MRN: [id number]  HPI Symptoms began 2 weeks ago with dysuria and progressed to bilateral flank pain.  Patient reports occasional odor to urine.  No complaints of flank pain today   Past history is negative for diabetes or immunosuppression.  Patient never smoked          Review of Systems There was no associated  fever, chills, sweats, nausea/vomiting,rash, radicular pain.  No treatment to date.  No antibiotic administration in the past 30 day.; No history of recurrent urinary tract infections in the past year.  There is no past history of genitourinary disease such as renal calculi; urinary tract abnormality; instrumentation or trauma       Objective:   Physical Exam Eyes: No conjunctival inflammation or scleral icterus is present.  Oral exam: Dental hygiene is good; lips and gums are healthy appearing.There is no oropharyngeal erythema or exudate noted.   Heart:  Normal rate and regular rhythm. S1 and S2 normal without gallop,  click, rub or other extra sounds  .Grade 1/6 systolic murmur   Lungs:Chest clear to auscultation; no wheezes, rhonchi,rales ,or rubs present.No increased work of breathing.   Abdomen: bowel sounds normal, soft and non-tender without masses, organomegaly or hernias noted.  No guarding or rebound   Musculoskeletal/Extremities: Lipidema of the ankles. She is able to lie flat and sit up without help. There is no pain to percussion of the flanks. There is negative straight leg raising to 90  Skin:Warm & dry.  Intact without suspicious lesions or rashes ; no jaundice or tenting  Lymphatic: No lymphadenopathy is noted about the head, neck, axilla         Assessment & Plan:  #1 bladder symptoms manifested as some dysuria, malodorous urine, and flank pain. The symptoms have improved subjectively.  Plan: Pyridium prn pending urine culture. She may need evaluation for  vaginal dryness if  symptoms persist & culture negative

## 2013-02-15 NOTE — Patient Instructions (Addendum)
Drink as much nondairy fluids as possible. Avoid spicy foods or alcohol as  these may aggravate the bladder. Do not take decongestants. Avoid narcotics if possible. 

## 2013-02-19 ENCOUNTER — Encounter: Payer: Self-pay | Admitting: Internal Medicine

## 2013-02-19 ENCOUNTER — Other Ambulatory Visit: Payer: Self-pay | Admitting: Internal Medicine

## 2013-02-19 DIAGNOSIS — N390 Urinary tract infection, site not specified: Secondary | ICD-10-CM

## 2013-02-19 LAB — URINE CULTURE: Colony Count: >=100000

## 2013-02-19 MED ORDER — AMOXICILLIN 500 MG PO CAPS: 500.0000 mg | ORAL_CAPSULE | Freq: Three times a day (TID) | ORAL | Status: DC

## 2013-03-10 ENCOUNTER — Other Ambulatory Visit: Payer: Self-pay | Admitting: Internal Medicine

## 2013-06-07 ENCOUNTER — Ambulatory Visit (INDEPENDENT_AMBULATORY_CARE_PROVIDER_SITE_OTHER): Payer: Medicare Other | Admitting: Internal Medicine

## 2013-06-07 ENCOUNTER — Encounter: Payer: Self-pay | Admitting: Internal Medicine

## 2013-06-07 VITALS — BP 120/70 | HR 56 | Temp 97.7°F | Wt 175.0 lb

## 2013-06-07 DIAGNOSIS — I10 Essential (primary) hypertension: Secondary | ICD-10-CM

## 2013-06-07 DIAGNOSIS — R42 Dizziness and giddiness: Secondary | ICD-10-CM

## 2013-06-07 DIAGNOSIS — R55 Syncope and collapse: Secondary | ICD-10-CM

## 2013-06-07 DIAGNOSIS — I951 Orthostatic hypotension: Secondary | ICD-10-CM

## 2013-06-07 NOTE — Progress Notes (Signed)
Subjective:    Patient ID: Valerie Rollins, female    DOB: December 16, 1935, 77 y.o.   MRN: [id number]  HPI  She has had dizziness or lightheadedness for one week without specific trigger. It has been persistent whenever she is sitting or standing. It does seem to mitigate when in bed. Her symptoms have been definitely worse when arising from bed or chair.  She denies frank vertigo or presyncope except today.   Today she felt as if she might faint. Her husband states that she exhibited pallor. Her blood pressure was recorded as low as 93/73 at home.  She did extensive frontal headache approximately 1 week ago in the context of starting prednisone for tendinitis. As the prednisone dose has decreased her symptoms have improved.   The symptoms are not related to position change in bed; neck rotation or extension; straining;or  pain.  She denies any cardiac prodrome of palpitations; regular heart rate; or rhythm change. She also has no neurologic prodrome for the  lightheadedness other than the isolated headache which is improved. She had no associated weakness or numbness or tingling in her extremities.  There is no definite mitigating factor other than being supine for the lightheadedness.  She does have history of frank syncope in the setting of community acquired pneumonia in December 2000 well. It was that time she sustained an ankle fracture following falling. No PMH seizures         Review of Systems   She denies symptoms of fever, chills, or weight change. She denies frank diaphoresis but feels "sticky".  She has had no frontal or facial sinus pressure or nasal purulence.  She has no hearing loss or ringing in the ears. There's been no associated earache or discharge from ears.  She did have some blurred vision today but otherwise no double vision or loss of vision  There's been no associated shortness of breath. She has had an intermittent dry cough.  2-3 days ago she had some  nonexertional upper chest discomfort which was self-limited.  She denies any significant anxiety, panic, or depression.     Objective:   Physical Exam Gen.:  well-nourished in appearance. Alert, appropriate and cooperative throughout exam.  Head: Normocephalic without obvious abnormalities Eyes: No corneal or conjunctival inflammation noted.  Extraocular motion intact. Vision grossly normal with lenses. No nystagmus present. Field of vision normal Ears: External  ear exam reveals no significant lesions or deformities. Canals clear .TMs normal. Hearing is grossly normal bilaterally. Tuning fork exam normal Nose: External nasal exam reveals no deformity or inflammation. Nasal mucosa are pink and moist. No lesions or exudates noted.  Mouth: Oral mucosa and oropharynx reveal no lesions or exudates. Teeth in good repair. Neck: No deformities, masses, or tenderness noted. Range of motion excellent Lungs: Normal respiratory effort; chest expands symmetrically. Lungs are clear to auscultation without rales, wheezes, or increased work of breathing. Heart: Normal rate and rhythm. Normal S1 and S2. No gallop, click, or rub. S4 ; grade 1 systolic murmur right base. Abdomen: Bowel sounds normal; abdomen soft and nontender. No masses, organomegaly or hernias noted.                                 Musculoskeletal/extremities: No clubbing, cyanosis, edema, or significant extremity  deformity noted. Range of motion normal .Tone & strength  Normal. Joints normal. Nail health good. Able to lie down & sit up w/o help.  Vascular: Carotid, radial artery pulses are full and equal. Pedal pulses decreased but intact. No bruits present. Some varicosities of the lower extremities Neurologic: Alert and oriented x3. Deep tendon reflexes symmetrical and normal.  Gait normal ; heel and toe walking could not be tested because of her history of ankle fracture. Romberg testing and finger to nose testing normal. No cranial  nerve deficit        Skin: Intact without suspicious lesions or rashes. Lymph: No cervical, axillary lymphadenopathy present. Psych: Mood and affect are normal. Normally interactive            Supine blood pressure 140/90; standing blood pressure 118/78. The latter was associated with slight symptoms of lightheadedness                                                                              Assessment & Plan:  #1 postural lightheadedness with documented blood pressure drop #2 headache resolved #3 intermittent cough #4 atypical chest pain resolved See Orders: CBC and  BMET. Support hose would be indicated when upright. Isometrics prior to standing

## 2013-06-07 NOTE — Patient Instructions (Addendum)
Please wear the over the calf support hose when standing or ambulating. Repeat the isometric exercises discussed 4- 5 times prior to standing if you've been seated for a period of time.

## 2013-06-08 ENCOUNTER — Telehealth: Payer: Self-pay | Admitting: Internal Medicine

## 2013-06-08 LAB — CBC WITH DIFFERENTIAL/PLATELET
Basophils Absolute: 0 10*3/uL (ref 0.0–0.1)
Eosinophils Absolute: 0.2 10*3/uL (ref 0.0–0.7)
Hemoglobin: 15.4 g/dL — ABNORMAL HIGH (ref 12.0–15.0)
Lymphs Abs: 2.4 10*3/uL (ref 0.7–4.0)
Monocytes Absolute: 0.7 10*3/uL (ref 0.1–1.0)
RBC: 5.07 Mil/uL (ref 3.87–5.11)

## 2013-06-08 LAB — BASIC METABOLIC PANEL
Calcium: 9.8 mg/dL (ref 8.4–10.5)
GFR: 51.49 mL/min — ABNORMAL LOW (ref >60.00)
Glucose, Bld: 86 mg/dL (ref 70–99)
Potassium: 4 mEq/L (ref 3.5–5.1)
Sodium: 139 mEq/L (ref 135–145)

## 2013-06-08 NOTE — Telephone Encounter (Signed)
Patient says she was seen yesterday and was told to call back with her Shingles Vaccine information. She had the injection on 09/18/2012. Manufacturer was [employer].

## 2013-06-08 NOTE — Telephone Encounter (Signed)
Noted under historical immunizations

## 2013-06-19 ENCOUNTER — Other Ambulatory Visit: Payer: Self-pay | Admitting: Internal Medicine

## 2013-06-27 ENCOUNTER — Other Ambulatory Visit: Payer: Self-pay

## 2013-07-31 ENCOUNTER — Ambulatory Visit (INDEPENDENT_AMBULATORY_CARE_PROVIDER_SITE_OTHER): Payer: Medicare Other | Admitting: Internal Medicine

## 2013-07-31 ENCOUNTER — Encounter: Payer: Self-pay | Admitting: Internal Medicine

## 2013-07-31 VITALS — BP 110/67 | HR 64 | Temp 98.5°F | Ht 65.25 in | Wt 172.6 lb

## 2013-07-31 DIAGNOSIS — Z0000 Encounter for general adult medical examination without abnormal findings: Secondary | ICD-10-CM

## 2013-07-31 DIAGNOSIS — R609 Edema, unspecified: Secondary | ICD-10-CM

## 2013-07-31 DIAGNOSIS — I10 Essential (primary) hypertension: Secondary | ICD-10-CM

## 2013-07-31 DIAGNOSIS — E785 Hyperlipidemia, unspecified: Secondary | ICD-10-CM

## 2013-07-31 LAB — TSH: TSH: 3.28 u[IU]/mL (ref 0.35–5.50)

## 2013-07-31 LAB — LDL CHOLESTEROL, DIRECT: Direct LDL: 152.6 mg/dL

## 2013-07-31 LAB — HEPATIC FUNCTION PANEL
Alkaline Phosphatase: 65 U/L (ref 39–117)
Bilirubin, Direct: 0 mg/dL (ref 0.0–0.3)
Total Protein: 7.4 g/dL (ref 6.0–8.3)

## 2013-07-31 LAB — LIPID PANEL
HDL: 70.7 mg/dL (ref >39.00)
Triglycerides: 154 mg/dL — ABNORMAL HIGH (ref 0.0–149.0)
VLDL: 30.8 mg/dL (ref 0.0–40.0)

## 2013-07-31 MED ORDER — FUROSEMIDE 20 MG PO TABS: 20.0000 mg | ORAL_TABLET | Freq: Every day | ORAL | Status: DC

## 2013-07-31 MED ORDER — CARVEDILOL 25 MG PO TABS: 25.0000 mg | ORAL_TABLET | Freq: Two times a day (BID) | ORAL | Status: DC

## 2013-07-31 MED ORDER — MONTELUKAST SODIUM 10 MG PO TABS: ORAL_TABLET | ORAL | Status: DC

## 2013-07-31 NOTE — Patient Instructions (Addendum)

## 2013-07-31 NOTE — Progress Notes (Signed)
Subjective:    Patient ID: [name], female    DOB: 03-Jul-1936, 77 y.o.   MRN: [id number]  HPI Medicare Wellness Visit:  Psychosocial & medical history were reviewed as required by Medicare (abuse,antisocial behavioral risks,firearm risk).  Social history: caffeine: 4 glasses tea/ day , alcohol:no   ,  tobacco use: no   Exercise : see below  Home & personal  safety / fall risk:no Limitations of activities of daily living:no Seatbelt  and smoke alarm use:yes Power of Attorney/Living Will status : in place Ophthalmology exam status :every 6 mos Hearing evaluation status:not  current Orientation :oriented X 3 Memory & recall :good Math testing:good Active depression / anxiety:denied Foreign travel history : never Immunization status for Shingles /Flu/ PNA/ tetanus :current Transfusion history:  no Preventive health surveillance status of colonoscopy, BMD , mammograms,PAP as per protocol/ SOC:Gyn F/U due Dental care:  Every 6 mos Chart reviewed &  Updated. Active issues reviewed & addressed.      Review of Systems CHRONIC HYPERTENSION follow-up:  Home blood pressure range/ average not monitored  Patient is compliant with medications  No adverse effects noted from medication  No exercise program due to tendonitis in R foot  On low-salt diet  No chest pain, palpitations, dyspnea, claudication, or paroxysmal nocturnal dyspnea described. Edema is chronic bilaterally  No significant lightheadedness, headache, epistaxis, or syncope        Objective:   Physical Exam  Gen.: Healthy and well-nourished in appearance. Alert, appropriate and cooperative throughout exam.Appears younger than stated age  Head: Normocephalic without obvious abnormalities Eyes: No corneal or conjunctival inflammation noted. Pupils equal round reactive to light and accommodation. Fundal exam is benign without hemorrhages, exudate, papilledema. Extraocular motion intact. Vision grossly normal with  lenses Ears: External  ear exam reveals no significant lesions or deformities. Canals clear .TMs normal. Hearing is grossly normal bilaterally. Nose: External nasal exam reveals no deformity or inflammation. Nasal mucosa are pink and moist. No lesions or exudates noted.   Mouth: Oral mucosa and oropharynx reveal no lesions or exudates. Teeth in good repair. Neck: No deformities, masses, or tenderness noted. Range of motion decreased. Thyroid normal. Lungs: Normal respiratory effort; chest expands symmetrically. Lungs are clear to auscultation without rales, wheezes, or increased work of breathing. Heart: Normal rate and rhythm. Normal S1 and S2. No gallop, click, or rub. Grade 1/6 systolic murmur. Abdomen: Bowel sounds normal; abdomen soft and nontender. No masses, organomegaly or hernias noted. Genitalia: As per Gyn                                  Musculoskeletal/extremities: No deformity or scoliosis noted of  the thoracic or lumbar spine.   No clubbing, cyanosis,  or significant extremity  deformity noted. Range of motion normal .Tone & strength  Normal. Lipedema Joints normal . Nail health good. Able to lie down & sit up w/o help. Negative SLR bilaterally Vascular: Carotid, radial artery, dorsalis pedis and  posterior tibial pulses are  Equal. Decreased DPP. No bruits present. Neurologic: Alert and oriented x3. Deep tendon reflexes symmetrical and normal.         Skin: Intact without suspicious lesions or rashes. Lymph: No cervical, axillary lymphadenopathy present. Psych: Mood and affect are normal. Normally interactive  Assessment & Plan:  #1 Medicare Wellness Exam; criteria met ; data entered #2 Problem List/Diagnoses reviewed Plan:  Assessments made/ Orders entered  

## 2013-08-14 ENCOUNTER — Other Ambulatory Visit: Payer: Self-pay | Admitting: Internal Medicine

## 2013-09-14 ENCOUNTER — Telehealth: Payer: Self-pay | Admitting: Internal Medicine

## 2013-09-14 DIAGNOSIS — E2839 Other primary ovarian failure: Secondary | ICD-10-CM

## 2013-09-14 NOTE — Telephone Encounter (Signed)
Can you place order for bone density please? Patient is already scheduled (she was due in 2012)

## 2013-09-14 NOTE — Telephone Encounter (Signed)
Orders placed.

## 2013-09-20 ENCOUNTER — Ambulatory Visit (INDEPENDENT_AMBULATORY_CARE_PROVIDER_SITE_OTHER): Payer: Medicare Other

## 2013-09-20 DIAGNOSIS — Z23 Encounter for immunization: Secondary | ICD-10-CM

## 2013-09-25 ENCOUNTER — Ambulatory Visit (INDEPENDENT_AMBULATORY_CARE_PROVIDER_SITE_OTHER)
Admission: RE | Admit: 2013-09-25 | Discharge: 2013-09-25 | Disposition: A | Discharge status: Home / Self Care | Payer: Medicare Other | Source: Ambulatory Visit | Attending: Internal Medicine | Admitting: Internal Medicine

## 2013-09-25 DIAGNOSIS — E2839 Other primary ovarian failure: Secondary | ICD-10-CM

## 2013-10-25 ENCOUNTER — Telehealth: Payer: Self-pay | Admitting: Internal Medicine

## 2013-10-25 ENCOUNTER — Telehealth: Payer: Self-pay | Admitting: *Deleted

## 2013-10-25 NOTE — Telephone Encounter (Signed)
This would not be necessary

## 2013-10-25 NOTE — Telephone Encounter (Signed)
Called and left message for patient to inform her that taking the amoxicillin is ok and that I am sending her a copy of the bone density report in the mail. JG//CMA

## 2013-10-25 NOTE — Telephone Encounter (Signed)
Patient called and wanted a copay of her bone density report sent to her house. Also patient had a question regarding her dentist prescribe her amoxicillin and she is about to schedule an apt to have some skin tags removed, wanted to make sure it was okay to take the medication.

## 2013-10-25 NOTE — Telephone Encounter (Signed)
Patient would like to know if she should take amoxicillin before her appt with dermatology to have skin tags removed. She stated that she was given a card stating she should take it before procedures. Please advise.

## 2013-10-26 NOTE — Telephone Encounter (Signed)
Called and left message for patient that taking amoxicillin before her dermatology procedure is not necessary per Dr. Alwyn Ren.

## 2013-11-27 ENCOUNTER — Ambulatory Visit: Payer: Medicare Other | Admitting: Internal Medicine

## 2013-11-30 ENCOUNTER — Ambulatory Visit (INDEPENDENT_AMBULATORY_CARE_PROVIDER_SITE_OTHER): Payer: Managed Care, Other (non HMO) | Admitting: Internal Medicine

## 2013-11-30 ENCOUNTER — Encounter: Payer: Self-pay | Admitting: Internal Medicine

## 2013-11-30 VITALS — BP 134/76 | HR 58 | Ht 65.25 in | Wt 179.0 lb

## 2013-11-30 DIAGNOSIS — K219 Gastro-esophageal reflux disease without esophagitis: Secondary | ICD-10-CM

## 2013-11-30 DIAGNOSIS — K589 Irritable bowel syndrome without diarrhea: Secondary | ICD-10-CM

## 2013-11-30 DIAGNOSIS — R159 Full incontinence of feces: Secondary | ICD-10-CM

## 2013-11-30 MED ORDER — DICYCLOMINE HCL 20 MG PO TABS: 20.0000 mg | ORAL_TABLET | Freq: Four times a day (QID) | ORAL | Status: DC | PRN

## 2013-11-30 MED ORDER — COLESTIPOL HCL 1 G PO TABS: 1.0000 g | ORAL_TABLET | Freq: Two times a day (BID) | ORAL | Status: DC

## 2013-11-30 NOTE — Patient Instructions (Signed)
We have sent the following medications to your pharmacy for you to pick up at your convenience:  Colestipol, Bentyl

## 2013-11-30 NOTE — Progress Notes (Signed)
HISTORY OF PRESENT ILLNESS:  Valerie Rollins is a 78 y.o. female with hypertension, prior DVT, IBS, GERD, and intermittent fecal soilage due to pelvic floor relaxation. She was last evaluated 05/28/2011 regarding her gastrointestinal conditions and repeat screening colonoscopy. Colonoscopy was indeed performed 06/25/2011. The examination revealed severe diverticulosis with sigmoid stenosis. Otherwise normal. She presents today for routine followup regarding management of her chronic GI disorders. Also, medication refill. For GERD she takes omeprazole 20 mg daily. No reflux symptoms on medication. No dysphagia. For her IBS and incontinence she uses a combination of Colestid and dicyclomine. The Colestid has firmed up her bowels and resulted in less incontinent episodes. The dicyclomine as used on demand for abdominal pain. This works nicely. No new complaints. Overall general health has been good.  REVIEW OF SYSTEMS:  All non-GI ROS negative except for back pain, sinus and allergy troubles  Past Medical History  Diagnosis Date  . Diverticulitis 2002    based on CT scan  . Hyperlipidemia   . Hypertension   . Superficial phlebitis      X 2, 1999, 2000  . Ocular hypertension     glaucoma suspect, Dr. Kathrin Penner  . Skin cancer   . Endometriosis   . Gastritis 2006    @ Endo  . Hiatal hernia   . Diverticulosis     Dr Henrene Pastor  . GERD (gastroesophageal reflux disease)   . DVT (deep venous thrombosis) 2000    no trigger  . Syncope 11/12/11    in context CAP . Carotid Doppler exam was negative. 2D echocardiogram revealed mild dilation of the left atrium and moderate increase in the systolic pressureof  the pulmonary artery  . Atrial fibrillation     Post op after repair of ankle fracture  . UTI (lower urinary tract infection) 01/2013    Strep bovis ; PCN sensitive  . IBS (irritable bowel syndrome)     Past Surgical History  Procedure Laterality Date  . Total abdominal hysterectomy w/ bilateral  salpingoophorectomy  1980    dysfunctional menses, endometriosis  . Varicose vein surgery      1960, 1965, 2009  . Tonsillectomy    . Cataract extraction w/ intraocular lens implant      [facility]; Dr Kathrin Penner  . Colonoscopy  1995, 2002, 2012    diverticulosis; Dr Henrene Pastor  . Orif ankle fracture  11/14/2011    Procedure: OPEN REDUCTION INTERNAL FIXATION (ORIF) ANKLE FRACTURE;  Surgeon: Colin Rhein;  Location: [facility];  Service: Orthopedics;  Laterality: Left;  open reduction internal fixation trimalleolar ankle fracture    Social History Valerie Rollins  reports that she has never smoked. She has never used smokeless tobacco. She reports that she does not drink alcohol or use illicit drugs.  family history includes Alzheimer's disease in her mother; Colon cancer in her maternal grandmother; Diverticulitis in her daughter; Heart attack (age of onset: 31) in her father; Heart attack (age of onset: 65) in her paternal grandmother; Hyperlipidemia in her brother; Hypertension in her brother. There is no history of Diabetes or Stroke.  Allergies  Allergen Reactions  . Oxybutynin Chloride     Tongue swelling; Rx from Dr Hessie Diener  . Propoxyphene N-Acetaminophen     REACTION: made pt blood to thin  . Alendronate Sodium     ? reaction  . Ramipril     REACTION: cough       PHYSICAL EXAMINATION: Vital signs: BP 134/76  Pulse 58  Ht 5' 5.25" (  1.657 m)  Wt 179 lb (81.194 kg)  BMI 29.57 kg/m2 General: Well-developed, well-nourished, no acute distress HEENT: Sclerae are anicteric, conjunctiva pink. Oral mucosa intact Lungs: Clear Heart: Regular Abdomen: soft, nontender, nondistended, no obvious ascites, no peritoneal signs, normal bowel sounds. No organomegaly. Extremities: No edema Psychiatric: alert and oriented x3. Cooperative     ASSESSMENT:  #1. GERD. Symptoms controlled with PPI #2. IBS. Dicyclomine is helping the pain #3. Intermittent incontinence. Improved with firmer bowels with  Colestid. Currently taking once daily #4. Colonoscopy 2012 with diverticulosis only    PLAN:  #1. Refill dicyclomine #2. Refill Colestid #3. Continue omeprazole and reflux precautions #4. Routine GI followup in 2 years. Sooner if needed. Ongoing general medical care with Dr. Linna Darner

## 2013-12-16 ENCOUNTER — Other Ambulatory Visit: Payer: Self-pay | Admitting: Internal Medicine

## 2013-12-17 NOTE — Telephone Encounter (Signed)
Omeprazole refilled per protocol. JG//CMA 

## 2013-12-20 ENCOUNTER — Encounter: Payer: Self-pay | Admitting: Internal Medicine

## 2013-12-20 ENCOUNTER — Ambulatory Visit (INDEPENDENT_AMBULATORY_CARE_PROVIDER_SITE_OTHER): Payer: Managed Care, Other (non HMO) | Admitting: Internal Medicine

## 2013-12-20 ENCOUNTER — Ambulatory Visit (INDEPENDENT_AMBULATORY_CARE_PROVIDER_SITE_OTHER)
Admission: RE | Admit: 2013-12-20 | Discharge: 2013-12-20 | Disposition: A | Discharge status: Home / Self Care | Payer: Managed Care, Other (non HMO) | Source: Ambulatory Visit | Attending: Internal Medicine | Admitting: Internal Medicine

## 2013-12-20 VITALS — BP 125/78 | HR 63 | Temp 99.0°F | Ht 65.0 in | Wt 177.6 lb

## 2013-12-20 DIAGNOSIS — J209 Acute bronchitis, unspecified: Secondary | ICD-10-CM

## 2013-12-20 DIAGNOSIS — J029 Acute pharyngitis, unspecified: Secondary | ICD-10-CM

## 2013-12-20 MED ORDER — AZITHROMYCIN 250 MG PO TABS: ORAL_TABLET | ORAL | Status: DC

## 2013-12-20 MED ORDER — HYDROCODONE-HOMATROPINE 5-1.5 MG/5ML PO SYRP: 5.0000 mL | ORAL_SOLUTION | Freq: Four times a day (QID) | ORAL | Status: DC | PRN

## 2013-12-20 NOTE — Progress Notes (Signed)
   Subjective:    Patient ID: Valerie Rollins, female    DOB: 1936-11-09, 78 y.o.   MRN: [id number]  HPI  Since yesterday she's had sore throat as well as nonproductive cough and clear  rhinitis  Present had similar illness last week. She's been using Zicam for the sore throat.  She's also had some pressure above her eyes.  When she brushed her ankle she was found to have community-acquired pneumonia on preop x-ray. She was unaware of this at that time   Review of Systems  Absent or facial pain, dental pain, otic pain, or otic discharge  She has no fever, chills, or sweats.     Objective:   Physical Exam General appearance:good health ;well nourished; no acute distress or increased work of breathing is present.  No  lymphadenopathy about the head, neck, or axilla noted.   Eyes: No conjunctival inflammation or lid edema is present.  Ears:  External ear exam shows no significant lesions or deformities.  Otoscopic examination reveals clear canals, tympanic membranes are intact bilaterally without bulging, retraction, inflammation or discharge.  Nose:  External nasal examination shows no deformity or inflammation. Nasal mucosa are pink and moist without lesions or exudates. No septal dislocation or deviation.No obstruction to airflow.   Oral exam: Dental hygiene is good; lips and gums are healthy appearing.There is minimal pharyngeal erythema ;no exudate noted.   Neck:  No deformities,  masses, or tenderness noted.    Heart:  Normal rate and regular rhythm. S1 and S2 normal without gallop,  click, rub or other extra sounds. Grade 6.2-2 /6  holosystolic murmur  Lungs: no wheezes but juicy rales  LL>RL lung fiels  Extremities:  No cyanosis, edema, or clubbing  noted    Skin: Warm & dry          Assessment & Plan:  #1 pharyngitis. The Centor criteria for pharyngitis which include fever, pharyngeal exudate, tender cervical  lymphadenopathy , and absence of cough were  assessed. #2 cough with abnormal breath sounds. Past history of asymptomatic community acquired pneumonia  See orders

## 2013-12-20 NOTE — Patient Instructions (Signed)
Order for x-rays entered into  the computer; these will be performed at [place]. across from [facility]. No appointment is necessary.

## 2013-12-20 NOTE — Progress Notes (Signed)
Pre visit review using our clinic review tool, if applicable. No additional management support is needed unless otherwise documented below in the visit note. 

## 2013-12-21 ENCOUNTER — Telehealth: Payer: Self-pay | Admitting: *Deleted

## 2013-12-21 NOTE — Telephone Encounter (Signed)
Called and informed patient that her CXR results were normal per Dr. Linna Darner. JG//CMA

## 2013-12-24 ENCOUNTER — Encounter: Payer: Self-pay | Admitting: *Deleted

## 2014-02-21 ENCOUNTER — Encounter: Payer: Self-pay | Admitting: Internal Medicine

## 2014-06-07 ENCOUNTER — Other Ambulatory Visit: Payer: Self-pay

## 2014-06-07 MED ORDER — OMEPRAZOLE 20 MG PO CPDR: DELAYED_RELEASE_CAPSULE | ORAL | Status: DC

## 2014-08-23 ENCOUNTER — Other Ambulatory Visit: Payer: Self-pay

## 2014-08-23 DIAGNOSIS — I10 Essential (primary) hypertension: Secondary | ICD-10-CM

## 2014-08-23 MED ORDER — CARVEDILOL 25 MG PO TABS: 25.0000 mg | ORAL_TABLET | Freq: Two times a day (BID) | ORAL | Status: DC

## 2014-09-03 ENCOUNTER — Other Ambulatory Visit: Payer: Self-pay

## 2014-09-03 MED ORDER — IPRATROPIUM-ALBUTEROL 20-100 MCG/ACT IN AERS: 1.0000 | INHALATION_SPRAY | Freq: Four times a day (QID) | RESPIRATORY_TRACT | Status: DC

## 2014-09-13 ENCOUNTER — Ambulatory Visit (INDEPENDENT_AMBULATORY_CARE_PROVIDER_SITE_OTHER): Payer: Managed Care, Other (non HMO)

## 2014-09-13 DIAGNOSIS — Z23 Encounter for immunization: Secondary | ICD-10-CM

## 2014-09-13 MED ORDER — PNEUMOCOCCAL 13-VAL CONJ VACC IM SUSP: 0.5000 mL | INTRAMUSCULAR | Status: DC

## 2014-09-13 NOTE — Addendum Note (Signed)
Addended by: Kelly Splinter on: 09/13/2014 03:13 PM   Modules accepted: Orders

## 2014-10-04 ENCOUNTER — Telehealth: Payer: Self-pay | Admitting: Internal Medicine

## 2014-10-04 NOTE — Telephone Encounter (Signed)
OK X 3 mos 

## 2014-10-04 NOTE — Telephone Encounter (Signed)
Pt calling for refill / [facility] - will run out before Tuesday 11/17 - montelukast (SINGULAIR).

## 2014-10-07 MED ORDER — MONTELUKAST SODIUM 10 MG PO TABS: ORAL_TABLET | ORAL | Status: DC

## 2014-10-07 NOTE — Telephone Encounter (Signed)
Prescription has been sent.

## 2014-11-13 ENCOUNTER — Encounter: Payer: Self-pay | Admitting: Internal Medicine

## 2014-11-13 ENCOUNTER — Ambulatory Visit (INDEPENDENT_AMBULATORY_CARE_PROVIDER_SITE_OTHER): Payer: Managed Care, Other (non HMO) | Admitting: Internal Medicine

## 2014-11-13 VITALS — BP 148/86 | HR 60 | Temp 98.7°F | Wt 175.5 lb

## 2014-11-13 DIAGNOSIS — K589 Irritable bowel syndrome without diarrhea: Secondary | ICD-10-CM

## 2014-11-13 DIAGNOSIS — R0600 Dyspnea, unspecified: Secondary | ICD-10-CM

## 2014-11-13 MED ORDER — PREDNISONE 20 MG PO TABS: 20.0000 mg | ORAL_TABLET | Freq: Two times a day (BID) | ORAL | Status: DC

## 2014-11-13 NOTE — Progress Notes (Signed)
Pre visit review using our clinic review tool, if applicable. No additional management support is needed unless otherwise documented below in the visit note. 

## 2014-11-13 NOTE — Patient Instructions (Signed)
Fill the  prescription for Prednisone it there is not  improvement of the shortness of breath in the next 48 hours. Please take a probiotic , Florastor OR Align, every day if the bowels are loose. This will replace the normal bacteria which  are necessary for formation of normal stool and processing of food.

## 2014-11-13 NOTE — Progress Notes (Signed)
   Subjective:    Patient ID: Valerie Rollins, female    DOB: Aug 28, 1936, 78 y.o.   MRN: [id number]  HPI  She has had chronic intermittent loose stool for 16 years. She's had exacerbation the last week without any specific trigger. The stool is loose but not diarrheal.  She did have two formed bowel movements yesterday and today.  She had started turmeric a month ago as a natural supplement after listening to Dr Irena Cords on tv. She stopped this a week ago does not feel this has effected the bowels.   She's also has had some shortness of breath and has been using the Combivent twice a day for the last week with some benefit. There was no specific trigger for this. She denies any signs of respiratory tract infections. She has no extrinsic symptoms either. She is concerned as she had similar symptoms prior to CAP episode.  Review of Systems Frontal headache, facial pain , nasal purulence, dental pain, sore throat , otic pain or otic discharge denied. No fever , chills or sweats.Extrinsic symptoms of itchy, watery eyes, sneezing, or angioedema are denied. There is no significant cough, sputum production, wheezing,or  paroxysmal nocturnal dyspnea.     Objective:   Physical Exam  Pertinent positive findings include: She has a grade 1/2 systolic murmur Pedal pulses are decreased. She has trace edema She has chronic stasis changes of skin of the lower extremities related to venous insufficiency.  General appearance:good health ;well nourished; no acute distress or increased work of breathing is present.  No  lymphadenopathy about the head, neck, or axilla noted.  Eyes: No conjunctival inflammation or lid edema is present. There is no scleral icterus. Ears:  External ear exam shows no significant lesions or deformities.  Otoscopic examination reveals clear canals, tympanic membranes are intact bilaterally without bulging, retraction, inflammation or discharge. Nose:  External nasal examination shows no  deformity or inflammation. Nasal mucosa are pink and moist without lesions or exudates. No septal dislocation or deviation.No obstruction to airflow.  Oral exam: Dental hygiene is good; lips and gums are healthy appearing.There is no oropharyngeal erythema or exudate noted.  Neck:  No deformities, thyromegaly, masses, or tenderness noted.   Supple with full range of motion without pain.  Heart:  Normal rate and regular rhythm. S1 and S2 normal without gallop,  click, rub or other extra sounds.  Lungs:Chest clear to auscultation; no wheezes, rhonchi,rales ,or rubs present.No increased work of breathing.   Extremities:  No cyanosis or clubbing  noted  Skin: Warm & dry w/o jaundice or tenting.       Assessment & Plan:  #1 IBS  #2 dyspnea w/o specific trigger or significant findings on exam  Plan: See orders & recommendations

## 2014-11-20 ENCOUNTER — Other Ambulatory Visit: Payer: Self-pay | Admitting: Internal Medicine

## 2014-11-27 ENCOUNTER — Encounter: Payer: Managed Care, Other (non HMO) | Admitting: Internal Medicine

## 2015-01-03 ENCOUNTER — Other Ambulatory Visit: Payer: Self-pay

## 2015-01-03 MED ORDER — MONTELUKAST SODIUM 10 MG PO TABS: ORAL_TABLET | ORAL | Status: DC

## 2015-01-31 ENCOUNTER — Other Ambulatory Visit: Payer: Self-pay | Admitting: *Deleted

## 2015-01-31 MED ORDER — FUROSEMIDE 20 MG PO TABS: 20.0000 mg | ORAL_TABLET | Freq: Every day | ORAL | Status: DC

## 2015-02-13 ENCOUNTER — Ambulatory Visit (INDEPENDENT_AMBULATORY_CARE_PROVIDER_SITE_OTHER): Payer: PPO | Admitting: Internal Medicine

## 2015-02-13 ENCOUNTER — Encounter: Payer: Self-pay | Admitting: Internal Medicine

## 2015-02-13 ENCOUNTER — Ambulatory Visit (INDEPENDENT_AMBULATORY_CARE_PROVIDER_SITE_OTHER)
Admission: RE | Admit: 2015-02-13 | Discharge: 2015-02-13 | Disposition: A | Discharge status: Home / Self Care | Payer: PPO | Source: Ambulatory Visit | Attending: Internal Medicine | Admitting: Internal Medicine

## 2015-02-13 VITALS — BP 142/84 | HR 61 | Temp 98.8°F | Ht 65.0 in | Wt 180.0 lb

## 2015-02-13 DIAGNOSIS — J4521 Mild intermittent asthma with (acute) exacerbation: Secondary | ICD-10-CM

## 2015-02-13 MED ORDER — PREDNISONE 20 MG PO TABS: 20.0000 mg | ORAL_TABLET | Freq: Two times a day (BID) | ORAL | Status: DC

## 2015-02-13 MED ORDER — AMOXICILLIN 500 MG PO CAPS: 500.0000 mg | ORAL_CAPSULE | Freq: Three times a day (TID) | ORAL | Status: DC

## 2015-02-13 NOTE — Progress Notes (Signed)
Pre visit review using our clinic review tool, if applicable. No additional management support is needed unless otherwise documented below in the visit note. 

## 2015-02-13 NOTE — Progress Notes (Signed)
   Subjective:    Patient ID: Valerie Rollins, female    DOB: Aug 22, 1936, 79 y.o.   MRN: [id number]  HPI  Her symptoms began 02/09/15 as a sore throat associated with some frontal headache/congestion. She also developed a cough which has been productive of light green sputum. Her husband had been ill with respiratory tract infection & was recently treated. She's been using her dual agent inhaler as well as Zicam and Cepacol with only partial response  Her temperatures has been as high as 100.2. All nasal secretions clear. She has noted some wheezing with the cough.  She denies a history of asthma although she has had evidence of reactive airways disease in the past. Her daughter does have asthma.  Review of Systems  She denies any itchy, watery eyes. The fever is not associated with chills or sweats. She denies any nasal purulence. She also has no facial pain, dental pain, otic pain, otic discharge. Despite the wheezing she's not had shortness of breath.      Objective:   Physical Exam Pertinent or positive findings include: There is erythema of the left nasal septum.  She has a grade 9-1.9 systolic murmur.  She has diffuse low-grade rhonchi and wheezing in all lung fields. This tends to be homogenous.  General appearance:Adequately nourished; no acute distress or increased work of breathing is present.   Lymphatic: No  lymphadenopathy about the head, neck, or axilla . Eyes: No conjunctival inflammation or lid edema is present. There is no scleral icterus. Ears:  External ear exam shows no significant lesions or deformities.  Otoscopic examination reveals clear canals, tympanic membranes are intact bilaterally without bulging, retraction, inflammation or discharge. Nose:  External nasal examination shows no deformity or inflammation. No septal dislocation or deviation.No obstruction to airflow.  Oral exam: Dental hygiene is good; lips and gums are healthy appearing.There is no oropharyngeal  erythema or exudate . Neck:  No deformities, thyromegaly, masses, or tenderness noted.   Supple with full range of motion without pain.  Heart:  Normal rate and regular rhythm. S1 and S2 normal without gallop, click, rub or other extra sounds.  Extremities:  No cyanosis, edema, or clubbing  noted  Skin: Warm & dry w/o tenting or jaundice. No significant lesions or rash.        Assessment & Plan:  #1 asthmatic bronchitis; rule out community-acquired pneumonia  Plan: See orders and after visit summary.

## 2015-02-13 NOTE — Patient Instructions (Signed)
Breo one inhalation daily; gargle and spit after use. Lot #:[id number];PGF:84/21  Plain Mucinex (NOT D) for thick secretions ;force NON dairy fluids .   Nasal cleansing in the shower as discussed with lather of mild shampoo.After 10 seconds wash off lather while  exhaling through nostrils. Make sure that all residual soap is removed to prevent irritation.  Flonase OR Nasacort AQ 1 spray in each nostril twice a day as needed. Use the "crossover" technique into opposite nostril spraying toward opposite ear @ 45 degree angle, not straight up into nostril.  Plain Allegra (NOT D )  160 daily , Loratidine 10 mg , OR Zyrtec 10 mg @ bedtime  as needed for itchy eyes & sneezing. Your next office appointment will be determined based upon review of your pending labs 7/or xrays  Those instructions will be transmitted to you by  mail for your records.  Critical results will be called.   Followup as needed for any active or acute issue. Please report any significant change in your symptoms.

## 2015-02-19 ENCOUNTER — Telehealth: Payer: Self-pay | Admitting: Internal Medicine

## 2015-02-20 ENCOUNTER — Ambulatory Visit (INDEPENDENT_AMBULATORY_CARE_PROVIDER_SITE_OTHER): Payer: PPO | Admitting: Internal Medicine

## 2015-02-20 ENCOUNTER — Encounter: Payer: Self-pay | Admitting: Internal Medicine

## 2015-02-20 VITALS — BP 138/80 | HR 58 | Temp 98.1°F | Ht 65.0 in | Wt 177.0 lb

## 2015-02-20 DIAGNOSIS — I517 Cardiomegaly: Secondary | ICD-10-CM | POA: Diagnosis not present

## 2015-02-20 DIAGNOSIS — I519 Heart disease, unspecified: Secondary | ICD-10-CM

## 2015-02-20 DIAGNOSIS — J209 Acute bronchitis, unspecified: Secondary | ICD-10-CM

## 2015-02-20 DIAGNOSIS — I5189 Other ill-defined heart diseases: Secondary | ICD-10-CM

## 2015-02-20 MED ORDER — LEVOFLOXACIN 500 MG PO TABS: 500.0000 mg | ORAL_TABLET | Freq: Every day | ORAL | Status: DC

## 2015-02-20 NOTE — Progress Notes (Signed)
   Subjective:    Patient ID: Valerie Rollins, female    DOB: 03/23/1936, 79 y.o.   MRN: [id number]  HPI Her cough persists and is productive of light green sputum. She has been sleeping in a recliner at night. She has completed the prednisone and is currently completing a course of amoxicillin. She takes montelukast at night but is unsure whether this has been of benefit. She does have Breo, an maintenance bronchodilator.  She has no significant active upper respiratory tract infection symptoms at this time. She does have some fatigue, frontal headache, & some hoarseness.  The 02/13/15 chest x-ray revealed significant cardiomegaly. Compared to a film 12/20/13 there's been suggestion of progressive cardiac enlargement. Films were reviewed with her. She was evaluated in February 2013 by Dr Phineas Semen for postoperative A fibrillation. Grade 1 diastolic dysfunction was diagnosed. At that time she had normal left ventricular function. She had mild left atrial enlargement; mild right ventricular enlargement; and mild to moderate right atrial enlargement.  Review of Systems Facial pain , nasal purulence, dental pain, sore throat , otic pain or otic discharge denied. No fever , chills or sweats.     Objective:   Physical Exam  Pertinent or positive findings include: She has a grade 1 systolic murmur at the right base.  She has no neck vein distention at 10.  Her chest is clear with no wheezing or rhonchi.  This is a dramatic improvement from 3/24.  She has varicose veins of the lower extremities particularly the left lower extremity.  The left ankle is deformed from previous trauma.  Pedal pulses are decreased.  Homans sign is negative.  General appearance :adequately nourished; in no distress. Eyes: No conjunctival inflammation or scleral icterus is present. Oral exam:  Lips and gums are healthy appearing.There is no oropharyngeal erythema or exudate noted. Dental hygiene is  good. Heart:  Normal rate and regular rhythm. S1 and S2 normal without gallop, click, rub or other extra sounds   Lungs:No increased work of breathing.  Abdomen: bowel sounds normal, soft and non-tender without masses, organomegaly or hernias noted.  No guarding or rebound. No HJR. Vascular : all pulses equal ; no bruits present. Skin:Warm & dry.  Intact without suspicious lesions or rashes ; no tenting or jaundice  Lymphatic: No lymphadenopathy is noted about the head, neck, axilla Neuro: Strength, tone & DTRs normal.        Assessment & Plan:  #1 asthmatic bronchitis; dramatic improvement clinically  #2 continuing bronchitis symptoms. The amoxicillin will be changed to Levaquin.  #3 progressive cardiac enlargement suggested on serial x-rays. Echocardiogram will be performed.  Cardiology evaluation for possible diastolic dysfunction will most likely be needed.

## 2015-02-20 NOTE — Progress Notes (Signed)
Pre visit review using our clinic review tool, if applicable. No additional management support is needed unless otherwise documented below in the visit note. 

## 2015-02-20 NOTE — Patient Instructions (Signed)
The referral for the ECHO will be scheduled and you'll be notified of the time.Please call the Referral Co-Ordinator @ [phone] if you have not been notified of appointment time within 7-10 days.

## 2015-02-26 ENCOUNTER — Ambulatory Visit (INDEPENDENT_AMBULATORY_CARE_PROVIDER_SITE_OTHER): Payer: PPO | Admitting: Internal Medicine

## 2015-02-26 ENCOUNTER — Other Ambulatory Visit: Payer: Self-pay | Admitting: Internal Medicine

## 2015-02-26 ENCOUNTER — Other Ambulatory Visit (INDEPENDENT_AMBULATORY_CARE_PROVIDER_SITE_OTHER): Payer: PPO

## 2015-02-26 ENCOUNTER — Encounter: Payer: Self-pay | Admitting: Internal Medicine

## 2015-02-26 VITALS — BP 120/90 | HR 59 | Temp 97.7°F | Resp 15 | Ht 65.0 in | Wt 177.0 lb

## 2015-02-26 DIAGNOSIS — K5730 Diverticulosis of large intestine without perforation or abscess without bleeding: Secondary | ICD-10-CM | POA: Diagnosis not present

## 2015-02-26 DIAGNOSIS — I10 Essential (primary) hypertension: Secondary | ICD-10-CM

## 2015-02-26 DIAGNOSIS — I517 Cardiomegaly: Secondary | ICD-10-CM

## 2015-02-26 DIAGNOSIS — E785 Hyperlipidemia, unspecified: Secondary | ICD-10-CM | POA: Diagnosis not present

## 2015-02-26 LAB — CBC WITH DIFFERENTIAL/PLATELET
BASOS PCT: 0.4 % (ref 0.0–3.0)
Basophils Absolute: 0 10*3/uL (ref 0.0–0.1)
EOS ABS: 0.1 10*3/uL (ref 0.0–0.7)
Eosinophils Relative: 1.3 % (ref 0.0–5.0)
HEMATOCRIT: 39.4 % (ref 36.0–46.0)
Hemoglobin: 13.4 g/dL (ref 12.0–15.0)
LYMPHS ABS: 1.8 10*3/uL (ref 0.7–4.0)
LYMPHS PCT: 20 % (ref 12.0–46.0)
MCHC: 34 g/dL (ref 30.0–36.0)
MCV: 84.3 fl (ref 78.0–100.0)
Monocytes Absolute: 0.6 10*3/uL (ref 0.1–1.0)
Monocytes Relative: 6.7 % (ref 3.0–12.0)
NEUTROS ABS: 6.3 10*3/uL (ref 1.4–7.7)
Neutrophils Relative %: 71.6 % (ref 43.0–77.0)
PLATELETS: 268 10*3/uL (ref 150.0–400.0)
RBC: 4.68 Mil/uL (ref 3.87–5.11)
RDW: 13.3 % (ref 11.5–15.5)
WBC: 8.8 10*3/uL (ref 4.0–10.5)

## 2015-02-26 LAB — HEPATIC FUNCTION PANEL
ALT: 15 U/L (ref 0–35)
AST: 17 U/L (ref 0–37)
Albumin: 3.9 g/dL (ref 3.5–5.2)
Alkaline Phosphatase: 77 U/L (ref 39–117)
BILIRUBIN DIRECT: 0.1 mg/dL (ref 0.0–0.3)
BILIRUBIN TOTAL: 0.6 mg/dL (ref 0.2–1.2)
Total Protein: 7.1 g/dL (ref 6.0–8.3)

## 2015-02-26 LAB — BASIC METABOLIC PANEL
BUN: 23 mg/dL (ref 6–23)
CHLORIDE: 104 meq/L (ref 96–112)
CO2: 29 meq/L (ref 19–32)
Calcium: 9.8 mg/dL (ref 8.4–10.5)
Creatinine, Ser: 1.04 mg/dL (ref 0.40–1.20)
GFR: 54.68 mL/min — ABNORMAL LOW (ref >60.00)
Glucose, Bld: 91 mg/dL (ref 70–99)
Potassium: 4.3 mEq/L (ref 3.5–5.1)
SODIUM: 140 meq/L (ref 135–145)

## 2015-02-26 LAB — TSH: TSH: 5.32 u[IU]/mL — ABNORMAL HIGH (ref 0.35–4.50)

## 2015-02-26 MED ORDER — CARVEDILOL 25 MG PO TABS: 25.0000 mg | ORAL_TABLET | Freq: Two times a day (BID) | ORAL | Status: DC

## 2015-02-26 MED ORDER — FUROSEMIDE 20 MG PO TABS: 20.0000 mg | ORAL_TABLET | Freq: Every day | ORAL | Status: DC

## 2015-02-26 NOTE — Progress Notes (Signed)
Pre visit review using our clinic review tool, if applicable. No additional management support is needed unless otherwise documented below in the visit note. 

## 2015-02-26 NOTE — Patient Instructions (Signed)
  Your next office appointment will be determined based upon review of your pending labs Those instructions will be transmitted to you by mail for your records.  Critical results will be called.   Followup as needed for any active or acute issue. Please report any significant change in your symptoms. 

## 2015-02-26 NOTE — Assessment & Plan Note (Signed)
Blood pressure goals reviewed. BMET 

## 2015-02-26 NOTE — Assessment & Plan Note (Signed)
NMR Lipoprofile, LFT, TSH

## 2015-02-26 NOTE — Assessment & Plan Note (Signed)
2 D ECHO pending

## 2015-02-26 NOTE — Assessment & Plan Note (Signed)
CBC

## 2015-02-26 NOTE — Progress Notes (Signed)
Subjective:    Patient ID: Valerie Rollins, female    DOB: [birth year], 79 y.o.   MRN: [id number]  HPI The patient is here to assess status of active health conditions.  PMH, FH, & Social History reviewed & updated.  She's been compliant with her medications without adverse effects.  She has not been monitoring her blood pressures. She does have some chronic ankle edema. She has not received confirmation of the scheduling of the echocardiogram to assess cardiomegaly as yet  She had elevated LDLs with protective HDLs in the past. Prior advanced testing suggests that her LDL goal is less than 160. She's not been on a statin. Her father had a heart attack before the age 82 but was a smoker. Paternal grandmother had a heart attack in her 16s.    She has 2 pills of Levaquin left as treatment of her asthmatic bronchitis. She has some residual hoarseness. She has minimal white sputum at this time without other pulmonary symptoms.  According to her gastrologist she has aged out of colonoscopies. She has no active GI symptoms.    Review of Systems  There is no significant cough, sputum production,hemoptysis, wheezing,or  paroxysmal nocturnal dyspnea. Unexplained weight loss, abdominal pain, significant dyspepsia, dysphagia, melena, rectal bleeding, or persistently small caliber stools are not present. Dysuria, pyuria, hematuria, frequency, nocturia or polyuria are denied.     Objective:   Physical Exam Pertinent or positive findings include: She has a grade 6-8.1 systolic murmur @ the right base.  She has minimal flexion contraction of the right fifth digit. Lipedema is noted of the ankles.  There is trace left ankle edema.  She has varicosities of the lower extremities mainly of the left ankle and foot.  She has crepitus of her knees.  Gen.: Adequately nourished in appearance. Alert, appropriate and cooperative throughout exam. Appears younger than stated age  Head: Normocephalic without  obvious abnormalities  Eyes: No corneal or conjunctival inflammation noted. Pupils equal round reactive to light and accommodation. Extraocular motion intact.  Ears: External  ear exam reveals no significant lesions or deformities. Canals clear .TMs normal. Hearing is grossly normal bilaterally. Nose: External nasal exam reveals no deformity or inflammation. Nasal mucosa are pink and moist. No lesions or exudates noted.   Mouth: Oral mucosa and oropharynx reveal no lesions or exudates. Teeth in good repair. Neck: No deformities, masses, or tenderness noted. Range of motion & Thyroid normal. Lungs: Normal respiratory effort; chest expands symmetrically. Lungs are clear to auscultation without rales, wheezes, or increased work of breathing. Heart: Normal rate and rhythm. Normal S1 and S2. No gallop, click, or rub.  Abdomen: Bowel sounds normal; abdomen soft and nontender. No masses, organomegaly or hernias noted. Genitalia: as per Gyn                                  Musculoskeletal/extremities: No deformity or scoliosis noted of  the thoracic or lumbar spine.  No clubbing, cyanosis,  or significant extremity  deformity noted.  Range of motion normal . Tone & strength normal. Fingernail  health good. Able to lie down & sit up w/o help.  Negative SLR bilaterally Vascular: Carotid, radial artery, dorsalis pedis and  posterior tibial pulses are full and equal. No bruits present. Neurologic: Alert and oriented x3. Deep tendon reflexes symmetrical and normal.  Gait normal      Skin: Intact without suspicious lesions or rashes.  Lymph: No cervical, axillary lymphadenopathy present. Psych: Mood and affect are normal. Normally interactive                                                                                       Assessment & Plan:  See Current Assessment & Plan in Problem List under specific Diagnosis

## 2015-02-28 LAB — NMR LIPOPROFILE WITH LIPIDS
Cholesterol, Total: 205 mg/dL — ABNORMAL HIGH (ref 100–199)
HDL PARTICLE NUMBER: 38 umol/L (ref >=30.5)
HDL SIZE: 9.5 nm (ref >=9.2)
HDL-C: 65 mg/dL (ref >39)
LDL (calc): 120 mg/dL — ABNORMAL HIGH (ref 0–99)
LDL PARTICLE NUMBER: 1172 nmol/L — AB (ref <1000)
LDL Size: 21.7 nm (ref >=20.8)
LP-IR Score: 34 (ref <=45)
Large HDL-P: 9.9 umol/L (ref >=4.8)
Large VLDL-P: 3.8 nmol/L — ABNORMAL HIGH (ref <=2.7)
SMALL LDL PARTICLE NUMBER: 240 nmol/L (ref <=527)
Triglycerides: 102 mg/dL (ref 0–149)
VLDL Size: 46.5 nm (ref <=46.6)

## 2015-03-02 ENCOUNTER — Other Ambulatory Visit: Payer: Self-pay | Admitting: Internal Medicine

## 2015-03-02 DIAGNOSIS — R7989 Other specified abnormal findings of blood chemistry: Secondary | ICD-10-CM

## 2015-03-03 NOTE — Progress Notes (Signed)
Mailed results to pt.

## 2015-03-17 ENCOUNTER — Telehealth: Payer: Self-pay | Admitting: Internal Medicine

## 2015-03-17 NOTE — Telephone Encounter (Signed)
Error

## 2015-03-20 ENCOUNTER — Ambulatory Visit (HOSPITAL_COMMUNITY): Payer: PPO | Attending: Internal Medicine | Admitting: Radiology

## 2015-03-20 DIAGNOSIS — I517 Cardiomegaly: Secondary | ICD-10-CM | POA: Diagnosis not present

## 2015-03-20 NOTE — Progress Notes (Signed)
Echocardiogram performed.  

## 2015-03-21 ENCOUNTER — Telehealth: Payer: Self-pay | Admitting: Cardiology

## 2015-03-21 ENCOUNTER — Other Ambulatory Visit: Payer: Self-pay | Admitting: Internal Medicine

## 2015-03-21 DIAGNOSIS — D151 Benign neoplasm of heart: Secondary | ICD-10-CM

## 2015-03-21 NOTE — Telephone Encounter (Signed)
Unable to reach pt or leave a message  

## 2015-03-21 NOTE — Telephone Encounter (Signed)
Pt is calling in stating that Dr. Linna Darner is wanting her to follow up with Dr. Stanford Breed as soon as possible because she now has an enlarged heart and she had an abnormal echo. Please f/u with her   Thanks

## 2015-03-21 NOTE — Telephone Encounter (Signed)
Spoke with pt, Follow up scheduled  

## 2015-03-22 NOTE — Progress Notes (Signed)
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 previously seen in this office but not since 2/13. Had postoperative atrial fibrillation 12/12. An echocardiogram revealed normal LV function, grade 1 diastolic dysfunction, mild left atrial enlargement, mild right ventricular enlargement and mild to moderate right atrial enlargement. Had repeat echo 4/16 orderded by primary care for cardiomegaly; this revealed normal LV function, left atrial mass; mild MR. Patient has mild dyspnea with more extreme activities but no orthopnea, PND, palpitations, syncope or exertional chest pain. No fevers or chills. Chronic mild pedal edema.  Current Outpatient Prescriptions  Medication Sig Dispense Refill  . aspirin EC 81 MG tablet Take 81 mg by mouth daily.    . calcium citrate-vitamin D (CITRACAL+D) 315-200 MG-UNIT per tablet Take 2 tablets by mouth 2 (two) times daily.    . carvedilol (COREG) 25 MG tablet Take 1 tablet (25 mg total) by mouth 2 (two) times daily with a meal. 180 tablet 3  . Cholecalciferol (VITAMIN D3) 1000 UNITS CAPS Take 1,000 Units by mouth daily.     . colestipol (MICRONIZED COLESTIPOL HCL) 1 G tablet Take 1 tablet (1 g total) by mouth 2 (two) times daily. 60 tablet 11  . dicyclomine (BENTYL) 20 MG tablet Take 1 tablet (20 mg total) by mouth every 6 (six) hours as needed for spasms. 180 tablet 3  . fexofenadine (ALLEGRA) 180 MG tablet Take 180 mg by mouth as needed.     . furosemide (LASIX) 20 MG tablet Take 1 tablet (20 mg total) by mouth daily. 90 tablet 3  . Ipratropium-Albuterol (COMBIVENT RESPIMAT) 20-100 MCG/ACT AERS respimat Inhale 1 puff into the lungs every 6 (six) hours. 4 g 5  . latanoprost (XALATAN) 0.005 % ophthalmic solution Place 1 drop into both eyes daily.      Marland Kitchen levofloxacin (LEVAQUIN) 500 MG tablet Take 1 tablet (500 mg total) by mouth daily. 7 tablet 0  . montelukast (SINGULAIR) 10 MG tablet TAKE  ONE TABLET BY MOUTH NIGHTLY AT BEDTIME 90 tablet 1  . Multiple Vitamin  (MULITIVITAMIN WITH MINERALS) TABS Take 1 tablet by mouth daily.    Marland Kitchen omeprazole (PRILOSEC) 20 MG capsule TAKE ONE CAPSULE BY MOUTH ONE TIME DAILY 90 capsule 3  . traMADol (ULTRAM) 50 MG tablet Take 50 mg by mouth every 6 (six) hours as needed for pain. Dr.Ramos     Current Facility-Administered Medications  Medication Dose Route Frequency Provider Last Rate Last Dose  . pneumococcal 13-valent conjugate vaccine (PREVNAR 13) injection 0.5 mL  0.5 mL Intramuscular Tomorrow-1000 Hendricks Limes, MD        Allergies  Allergen Reactions  . Oxybutynin Chloride     Tongue swelling; Rx from Dr Hessie Diener  . Propoxyphene N-Acetaminophen     REACTION: made pt blood to thin  . Alendronate Sodium     ? reaction  . Ramipril     REACTION: cough    Past Medical History  Diagnosis Date  . Diverticulitis 2002    based on CT scan  . Hyperlipidemia   . Hypertension   . Superficial phlebitis      X 2, 1999, 2000  . Ocular hypertension     glaucoma suspect, Dr. Kathrin Penner  . Skin cancer   . Endometriosis   . Gastritis 2006    @ Endo  . Hiatal hernia   . Diverticulosis     Dr Henrene Pastor  . GERD (gastroesophageal reflux disease)   . DVT (deep venous thrombosis) 2000    no  trigger  . Syncope 11/12/11    in context CAP . Carotid Doppler exam was negative. 2D echocardiogram revealed mild dilation of the left atrium and moderate increase in the systolic pressureof  the pulmonary artery  . Atrial fibrillation     Post op after repair of ankle fracture  . UTI (lower urinary tract infection) 01/2013    Strep bovis ; PCN sensitive  . IBS (irritable bowel syndrome)     Past Surgical History  Procedure Laterality Date  . Total abdominal hysterectomy w/ bilateral salpingoophorectomy  1980    dysfunctional menses, endometriosis  . Varicose vein surgery      1960, 1965, 2009  . Tonsillectomy    . Cataract extraction w/ intraocular lens implant      [facility]; Dr Kathrin Penner  . Colonoscopy  1995, 2002, 2012      diverticulosis; Dr Henrene Pastor  . Orif ankle fracture  11/14/2011    Procedure: OPEN REDUCTION INTERNAL FIXATION (ORIF) ANKLE FRACTURE;  Surgeon: Colin Rhein;  Location: [facility];  Service: Orthopedics;  Laterality: Left;  open reduction internal fixation trimalleolar ankle fracture    History   Social History  . Marital Status: Married    Spouse Name: N/A  . Number of Children: 2  . Years of Education: N/A   Occupational History  . Retired    Social History Main Topics  . Smoking status: Never Smoker   . Smokeless tobacco: Never Used  . Alcohol Use: No  . Drug Use: No  . Sexual Activity: Not on file   Other Topics Concern  . Not on file   Social History Narrative   REG EXERCISE   HAD COLONOSCOPY AND ENDOSCOPY DONE DATES UNKNOWN    Family History  Problem Relation Age of Onset  . Heart attack Father 48  . Alzheimer's disease Mother     TIAs  . Hyperlipidemia Brother   . Hypertension Brother   . Diverticulitis Daughter     S/P colectomy  . Colon cancer Maternal Grandmother   . Heart attack Paternal Grandmother 82  . Diabetes Neg Hx   . Stroke Neg Hx     ROS: no fevers or chills, productive cough, hemoptysis, dysphasia, odynophagia, melena, hematochezia, dysuria, hematuria, rash, seizure activity, orthopnea, PND, claudication. Remaining systems are negative.  Physical Exam:   There were no vitals taken for this visit.  General:  Well developed/well nourished in NAD Skin warm/dry Patient not depressed No peripheral clubbing Back-normal HEENT-normal/normal eyelids Neck supple/normal carotid upstroke bilaterally; no bruits; no JVD; no thyromegaly chest - CTA/ normal expansion CV - RRR/normal S1 and S2; no rubs or gallops;  PMI nondisplaced, 2/6 systolic murmur left sternal border. Abdomen -NT/ND, no HSM, no mass, + bowel sounds, no bruit 2+ femoral pulses, no bruits Ext-trace edema, no chords, chronic varicosities Neuro-grossly nonfocal  ECG sinus  bradycardia at a rate of 55. Right bundle branch block.

## 2015-03-23 DIAGNOSIS — I2510 Atherosclerotic heart disease of native coronary artery without angina pectoris: Secondary | ICD-10-CM

## 2015-03-23 DIAGNOSIS — D151 Benign neoplasm of heart: Secondary | ICD-10-CM

## 2015-03-23 HISTORY — DX: Benign neoplasm of heart: D15.1

## 2015-03-23 HISTORY — DX: Atherosclerotic heart disease of native coronary artery without angina pectoris: I25.10

## 2015-03-25 ENCOUNTER — Encounter: Payer: Self-pay | Admitting: *Deleted

## 2015-03-25 ENCOUNTER — Ambulatory Visit (INDEPENDENT_AMBULATORY_CARE_PROVIDER_SITE_OTHER): Payer: PPO | Admitting: Cardiology

## 2015-03-25 ENCOUNTER — Other Ambulatory Visit: Payer: Self-pay | Admitting: Cardiology

## 2015-03-25 ENCOUNTER — Encounter: Payer: Self-pay | Admitting: Cardiology

## 2015-03-25 VITALS — BP 152/84 | HR 55 | Ht 65.0 in | Wt 178.7 lb

## 2015-03-25 DIAGNOSIS — I10 Essential (primary) hypertension: Secondary | ICD-10-CM | POA: Diagnosis not present

## 2015-03-25 DIAGNOSIS — D151 Benign neoplasm of heart: Secondary | ICD-10-CM

## 2015-03-25 DIAGNOSIS — D219 Benign neoplasm of connective and other soft tissue, unspecified: Secondary | ICD-10-CM

## 2015-03-25 DIAGNOSIS — I480 Paroxysmal atrial fibrillation: Secondary | ICD-10-CM | POA: Diagnosis not present

## 2015-03-25 NOTE — Assessment & Plan Note (Signed)
I have reviewed the patient's echocardiogram. There is a mass in the left atrium attached to the atrial septum. 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 I discussed this in detail with she and her family. I explained the risk of embolic event including stroke. I also explained that progressive growth could lead to hemodynamic compromise. I explained that this will need to be surgically removed. We will plan to proceed with cardiac catheterization preoperatively on Friday of this week. This has been scheduled with Dr Haroldine Laws. The risks and benefits were discussed and the patient agrees to proceed. I will also arrange for the patient to be seen by cardiothoracic surgery this week. Hopefully she can then be scheduled for resection early next week.

## 2015-03-25 NOTE — Patient Instructions (Addendum)
Your physician has requested that you have a cardiac catheterization. Cardiac catheterization is used to diagnose and/or treat various heart conditions. Doctors may recommend this procedure for a number of different reasons. The most common reason is to evaluate chest pain. Chest pain can be a symptom of coronary artery disease (CAD), and cardiac catheterization can show whether plaque is narrowing or blocking your heart's arteries. This procedure is also used to evaluate the valves, as well as measure the blood flow and oxygen levels in different parts of your heart. For further information please visit [email or web address]. Please follow instruction sheet, as given.   REFERRAL TO [facility] ASAP FOR MYXOMA

## 2015-03-25 NOTE — Assessment & Plan Note (Signed)
Patient had one episode that occurred following surgery for ankle fracture. No recurrences to date.

## 2015-03-25 NOTE — Assessment & Plan Note (Signed)
Blood pressure mildly elevated. Continue carvedilol and follow. Adjust medications based on follow-up readings.

## 2015-03-26 LAB — BASIC METABOLIC PANEL WITH GFR
BUN: 19 mg/dL (ref 6–23)
CO2: 27 mEq/L (ref 19–32)
Calcium: 9.7 mg/dL (ref 8.4–10.5)
Chloride: 102 mEq/L (ref 96–112)
Creat: 0.82 mg/dL (ref 0.50–1.10)
GFR, EST AFRICAN AMERICAN: 80 mL/min
GFR, EST NON AFRICAN AMERICAN: 70 mL/min
Glucose, Bld: 85 mg/dL (ref 70–99)
POTASSIUM: 3.8 meq/L (ref 3.5–5.3)
SODIUM: 140 meq/L (ref 135–145)

## 2015-03-26 LAB — CBC
HCT: 41 % (ref 36.0–46.0)
Hemoglobin: 13.7 g/dL (ref 12.0–15.0)
MCH: 28.2 pg (ref 26.0–34.0)
MCHC: 33.4 g/dL (ref 30.0–36.0)
MCV: 84.4 fL (ref 78.0–100.0)
MPV: 9.9 fL (ref 8.6–12.4)
PLATELETS: 328 10*3/uL (ref 150–400)
RBC: 4.86 MIL/uL (ref 3.87–5.11)
RDW: 13.7 % (ref 11.5–15.5)
WBC: 7 10*3/uL (ref 4.0–10.5)

## 2015-03-26 LAB — PROTIME-INR
INR: 1.11 (ref <1.50)
Prothrombin Time: 14.3 seconds (ref 11.6–15.2)

## 2015-03-28 ENCOUNTER — Encounter (HOSPITAL_COMMUNITY): Payer: Self-pay | Admitting: Internal Medicine

## 2015-03-28 ENCOUNTER — Encounter (HOSPITAL_COMMUNITY)
Admission: RE | Disposition: A | Discharge status: Home / Self Care | Payer: PPO | Source: Ambulatory Visit | Attending: Internal Medicine

## 2015-03-28 ENCOUNTER — Institutional Professional Consult (permissible substitution) (INDEPENDENT_AMBULATORY_CARE_PROVIDER_SITE_OTHER): Payer: PPO | Admitting: Cardiothoracic Surgery

## 2015-03-28 ENCOUNTER — Ambulatory Visit (HOSPITAL_COMMUNITY)
Admission: RE | Admit: 2015-03-28 | Discharge: 2015-03-28 | Disposition: A | Discharge status: Home / Self Care | Payer: PPO | Source: Ambulatory Visit | Attending: Internal Medicine | Admitting: Internal Medicine

## 2015-03-28 ENCOUNTER — Other Ambulatory Visit: Payer: Self-pay | Admitting: *Deleted

## 2015-03-28 VITALS — BP 132/82 | HR 86 | Resp 20 | Ht 65.5 in | Wt 178.0 lb

## 2015-03-28 DIAGNOSIS — D219 Benign neoplasm of connective and other soft tissue, unspecified: Secondary | ICD-10-CM

## 2015-03-28 DIAGNOSIS — I2510 Atherosclerotic heart disease of native coronary artery without angina pectoris: Secondary | ICD-10-CM | POA: Diagnosis not present

## 2015-03-28 DIAGNOSIS — Z7982 Long term (current) use of aspirin: Secondary | ICD-10-CM | POA: Insufficient documentation

## 2015-03-28 DIAGNOSIS — D151 Benign neoplasm of heart: Secondary | ICD-10-CM

## 2015-03-28 DIAGNOSIS — I10 Essential (primary) hypertension: Secondary | ICD-10-CM | POA: Insufficient documentation

## 2015-03-28 DIAGNOSIS — E785 Hyperlipidemia, unspecified: Secondary | ICD-10-CM | POA: Insufficient documentation

## 2015-03-28 DIAGNOSIS — Z23 Encounter for immunization: Secondary | ICD-10-CM | POA: Diagnosis not present

## 2015-03-28 HISTORY — PX: CARDIAC CATHETERIZATION: SHX172

## 2015-03-28 SURGERY — LEFT HEART CATH AND CORONARY ANGIOGRAPHY
Anesthesia: LOCAL

## 2015-03-28 MED ORDER — HEPARIN SODIUM (PORCINE) 1000 UNIT/ML IJ SOLN
INTRAMUSCULAR | Status: DC | PRN
  Administered 2015-03-28: 4000 [IU] via INTRAVENOUS

## 2015-03-28 MED ORDER — SODIUM CHLORIDE 0.9 % IJ SOLN: 3.0000 mL | Freq: Two times a day (BID) | INTRAMUSCULAR | Status: DC

## 2015-03-28 MED ORDER — SODIUM CHLORIDE 0.9 % IJ SOLN: 3.0000 mL | INTRAMUSCULAR | Status: DC | PRN

## 2015-03-28 MED ORDER — VERAPAMIL HCL 2.5 MG/ML IV SOLN
INTRAVENOUS | Status: AC
  Filled 2015-03-28: qty 2

## 2015-03-28 MED ORDER — HEPARIN SODIUM (PORCINE) 1000 UNIT/ML IJ SOLN
INTRAMUSCULAR | Status: AC
  Filled 2015-03-28: qty 1

## 2015-03-28 MED ORDER — MIDAZOLAM HCL 2 MG/2ML IJ SOLN
INTRAMUSCULAR | Status: AC
  Filled 2015-03-28: qty 2

## 2015-03-28 MED ORDER — ACETAMINOPHEN 325 MG PO TABS: 650.0000 mg | ORAL_TABLET | ORAL | Status: DC | PRN

## 2015-03-28 MED ORDER — SODIUM CHLORIDE 0.9 % IV SOLN
INTRAVENOUS | Status: DC | PRN
  Administered 2015-03-28: 250 mL via INTRAVENOUS

## 2015-03-28 MED ORDER — NITROGLYCERIN 1 MG/10 ML FOR IR/CATH LAB
INTRA_ARTERIAL | Status: AC
  Filled 2015-03-28: qty 10

## 2015-03-28 MED ORDER — SODIUM CHLORIDE 0.9 % IV SOLN: 250.0000 mL | INTRAVENOUS | Status: DC | PRN

## 2015-03-28 MED ORDER — ONDANSETRON HCL 4 MG/2ML IJ SOLN: 4.0000 mg | Freq: Four times a day (QID) | INTRAMUSCULAR | Status: DC | PRN

## 2015-03-28 MED ORDER — SODIUM CHLORIDE 0.9 % WEIGHT BASED INFUSION
3.0000 mL/kg/h | INTRAVENOUS | Status: DC
  Administered 2015-03-28: 3 mL/kg/h via INTRAVENOUS

## 2015-03-28 MED ORDER — HEPARIN (PORCINE) IN NACL 2-0.9 UNIT/ML-% IJ SOLN
INTRAMUSCULAR | Status: AC
  Filled 2015-03-28: qty 1000

## 2015-03-28 MED ORDER — ASPIRIN 81 MG PO CHEW: 81.0000 mg | CHEWABLE_TABLET | ORAL | Status: DC

## 2015-03-28 MED ORDER — SODIUM CHLORIDE 0.9 % IV SOLN: INTRAVENOUS | Status: DC

## 2015-03-28 MED ORDER — HEPARIN (PORCINE) IN NACL 2-0.9 UNIT/ML-% IJ SOLN
INTRAMUSCULAR | Status: AC
  Filled 2015-03-28: qty 500

## 2015-03-28 MED ORDER — FENTANYL CITRATE (PF) 100 MCG/2ML IJ SOLN
INTRAMUSCULAR | Status: AC
  Filled 2015-03-28: qty 2

## 2015-03-28 MED ORDER — MIDAZOLAM HCL 2 MG/2ML IJ SOLN
INTRAMUSCULAR | Status: DC | PRN
  Administered 2015-03-28: 2 mg via INTRAVENOUS

## 2015-03-28 MED ORDER — LIDOCAINE HCL (PF) 1 % IJ SOLN
INTRAMUSCULAR | Status: AC
  Filled 2015-03-28: qty 30

## 2015-03-28 MED ORDER — VERAPAMIL HCL 2.5 MG/ML IV SOLN
INTRAVENOUS | Status: DC | PRN
  Administered 2015-03-28: 10:00:00 via INTRA_ARTERIAL

## 2015-03-28 MED ORDER — FENTANYL CITRATE (PF) 100 MCG/2ML IJ SOLN
INTRAMUSCULAR | Status: DC | PRN
  Administered 2015-03-28: 25 ug via INTRAVENOUS

## 2015-03-28 MED ORDER — SODIUM CHLORIDE 0.9 % WEIGHT BASED INFUSION: 1.0000 mL/kg/h | INTRAVENOUS | Status: DC

## 2015-03-28 MED ORDER — IOHEXOL 350 MG/ML SOLN
INTRAVENOUS | Status: DC | PRN
  Administered 2015-03-28: 100 mL via INTRAVENOUS

## 2015-03-28 SURGICAL SUPPLY — 18 items
CATH INFINITI 5 FR AR2 MOD (CATHETERS) ×2 IMPLANT
CATH INFINITI 5 FR JL3.5 (CATHETERS) ×2 IMPLANT
CATH INFINITI 5FR AL1 (CATHETERS) ×2 IMPLANT
CATH INFINITI 5FR ANG PIGTAIL (CATHETERS) ×2 IMPLANT
CATH INFINITI 5FR MULTPACK ANG (CATHETERS) IMPLANT
CATH INFINITI JR4 5F (CATHETERS) ×2 IMPLANT
CATH SITESEER 5F NTR (CATHETERS) ×2 IMPLANT
DEVICE RAD COMP TR BAND LRG (VASCULAR PRODUCTS) ×2 IMPLANT
GLIDESHEATH SLEND SS 6F .021 (SHEATH) ×2 IMPLANT
KIT HEART LEFT (KITS) ×2 IMPLANT
PACK CARDIAC CATHETERIZATION (CUSTOM PROCEDURE TRAY) ×2 IMPLANT
SHEATH PINNACLE 5F 10CM (SHEATH) IMPLANT
SYR MEDRAD MARK V 150ML (SYRINGE) ×2 IMPLANT
TRANSDUCER W/STOPCOCK (MISCELLANEOUS) ×2 IMPLANT
TUBING CIL FLEX 10 FLL-RA (TUBING) ×2 IMPLANT
WIRE EMERALD 3MM-J .035X150CM (WIRE) IMPLANT
WIRE HI TORQ VERSACORE-J 145CM (WIRE) ×2 IMPLANT
WIRE SAFE-T 1.5MM-J .035X260CM (WIRE) ×2 IMPLANT

## 2015-03-28 NOTE — Discharge Instructions (Signed)
Radial Site Care °Refer to this sheet in the next few weeks. These instructions provide you with information on caring for yourself after your procedure. Your caregiver may also give you more specific instructions. Your treatment has been planned according to current medical practices, but problems sometimes occur. Call your caregiver if you have any problems or questions after your procedure. °HOME CARE INSTRUCTIONS °· You may shower the day after the procedure. Remove the bandage (dressing) and gently wash the site with plain soap and water. Gently pat the site dry. °· Do not apply powder or lotion to the site. °· Do not submerge the affected site in water for 3 to 5 days. °· Inspect the site at least twice daily. °· Do not flex or bend the affected arm for 24 hours. °· No lifting over 5 pounds (2.3 kg) for 5 days after your procedure. °· Do not drive home if you are discharged the same day of the procedure. Have someone else drive you. °· You may drive 24 hours after the procedure unless otherwise instructed by your caregiver. °· Do not operate machinery or power tools for 24 hours. °· A responsible adult should be with you for the first 24 hours after you arrive home. °What to expect: °· Any bruising will usually fade within 1 to 2 weeks. °· Blood that collects in the tissue (hematoma) may be painful to the touch. It should usually decrease in size and tenderness within 1 to 2 weeks. °SEEK IMMEDIATE MEDICAL CARE IF: °· You have unusual pain at the radial site. °· You have redness, warmth, swelling, or pain at the radial site. °· You have drainage (other than a small amount of blood on the dressing). °· You have chills. °· You have a fever or persistent symptoms for more than 72 hours. °· You have a fever and your symptoms suddenly get worse. °· Your arm becomes pale, cool, tingly, or numb. °· You have heavy bleeding from the site. Hold pressure on the site. °Document Released: 12/11/2010 Document Revised:  01/31/2012 Document Reviewed: 12/11/2010 °[employer]® Patient Information ©2015 [employer]. This information is not intended to replace advice given to you by your health care provider. Make sure you discuss any questions you have with your health care provider. ° °

## 2015-03-28 NOTE — H&P (View-Only) (Signed)
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 previously seen in this office but not since 2/13. Had postoperative atrial fibrillation 12/12. An echocardiogram revealed normal LV function, grade 1 diastolic dysfunction, mild left atrial enlargement, mild right ventricular enlargement and mild to moderate right atrial enlargement. Had repeat echo 4/16 orderded by primary care for cardiomegaly; this revealed normal LV function, left atrial mass; mild MR. Patient has mild dyspnea with more extreme activities but no orthopnea, PND, palpitations, syncope or exertional chest pain. No fevers or chills. Chronic mild pedal edema.  Current Outpatient Prescriptions  Medication Sig Dispense Refill  . aspirin EC 81 MG tablet Take 81 mg by mouth daily.    . calcium citrate-vitamin D (CITRACAL+D) 315-200 MG-UNIT per tablet Take 2 tablets by mouth 2 (two) times daily.    . carvedilol (COREG) 25 MG tablet Take 1 tablet (25 mg total) by mouth 2 (two) times daily with a meal. 180 tablet 3  . Cholecalciferol (VITAMIN D3) 1000 UNITS CAPS Take 1,000 Units by mouth daily.     . colestipol (MICRONIZED COLESTIPOL HCL) 1 G tablet Take 1 tablet (1 g total) by mouth 2 (two) times daily. 60 tablet 11  . dicyclomine (BENTYL) 20 MG tablet Take 1 tablet (20 mg total) by mouth every 6 (six) hours as needed for spasms. 180 tablet 3  . fexofenadine (ALLEGRA) 180 MG tablet Take 180 mg by mouth as needed.     . furosemide (LASIX) 20 MG tablet Take 1 tablet (20 mg total) by mouth daily. 90 tablet 3  . Ipratropium-Albuterol (COMBIVENT RESPIMAT) 20-100 MCG/ACT AERS respimat Inhale 1 puff into the lungs every 6 (six) hours. 4 g 5  . latanoprost (XALATAN) 0.005 % ophthalmic solution Place 1 drop into both eyes daily.      Marland Kitchen levofloxacin (LEVAQUIN) 500 MG tablet Take 1 tablet (500 mg total) by mouth daily. 7 tablet 0  . montelukast (SINGULAIR) 10 MG tablet TAKE  ONE TABLET BY MOUTH NIGHTLY AT BEDTIME 90 tablet 1  . Multiple Vitamin  (MULITIVITAMIN WITH MINERALS) TABS Take 1 tablet by mouth daily.    Marland Kitchen omeprazole (PRILOSEC) 20 MG capsule TAKE ONE CAPSULE BY MOUTH ONE TIME DAILY 90 capsule 3  . traMADol (ULTRAM) 50 MG tablet Take 50 mg by mouth every 6 (six) hours as needed for pain. Dr.Ramos     Current Facility-Administered Medications  Medication Dose Route Frequency Provider Last Rate Last Dose  . pneumococcal 13-valent conjugate vaccine (PREVNAR 13) injection 0.5 mL  0.5 mL Intramuscular Tomorrow-1000 Hendricks Limes, MD        Allergies  Allergen Reactions  . Oxybutynin Chloride     Tongue swelling; Rx from Dr Hessie Diener  . Propoxyphene N-Acetaminophen     REACTION: made pt blood to thin  . Alendronate Sodium     ? reaction  . Ramipril     REACTION: cough    Past Medical History  Diagnosis Date  . Diverticulitis 2002    based on CT scan  . Hyperlipidemia   . Hypertension   . Superficial phlebitis      X 2, 1999, 2000  . Ocular hypertension     glaucoma suspect, Dr. Kathrin Penner  . Skin cancer   . Endometriosis   . Gastritis 2006    @ Endo  . Hiatal hernia   . Diverticulosis     Dr Henrene Pastor  . GERD (gastroesophageal reflux disease)   . DVT (deep venous thrombosis) 2000    no  trigger  . Syncope 11/12/11    in context CAP . Carotid Doppler exam was negative. 2D echocardiogram revealed mild dilation of the left atrium and moderate increase in the systolic pressureof  the pulmonary artery  . Atrial fibrillation     Post op after repair of ankle fracture  . UTI (lower urinary tract infection) 01/2013    Strep bovis ; PCN sensitive  . IBS (irritable bowel syndrome)     Past Surgical History  Procedure Laterality Date  . Total abdominal hysterectomy w/ bilateral salpingoophorectomy  1980    dysfunctional menses, endometriosis  . Varicose vein surgery      1960, 1965, 2009  . Tonsillectomy    . Cataract extraction w/ intraocular lens implant      OS; Dr Kathrin Penner  . Colonoscopy  1995, 2002, 2012      diverticulosis; Dr Henrene Pastor  . Orif ankle fracture  11/14/2011    Procedure: OPEN REDUCTION INTERNAL FIXATION (ORIF) ANKLE FRACTURE;  Surgeon: Colin Rhein;  Location: [facility];  Service: Orthopedics;  Laterality: Left;  open reduction internal fixation trimalleolar ankle fracture    History   Social History  . Marital Status: Married    Spouse Name: N/A  . Number of Children: 2  . Years of Education: N/A   Occupational History  . Retired    Social History Main Topics  . Smoking status: Never Smoker   . Smokeless tobacco: Never Used  . Alcohol Use: No  . Drug Use: No  . Sexual Activity: Not on file   Other Topics Concern  . Not on file   Social History Narrative   REG EXERCISE   HAD COLONOSCOPY AND ENDOSCOPY DONE DATES UNKNOWN    Family History  Problem Relation Age of Onset  . Heart attack Father 64  . Alzheimer's disease Mother     TIAs  . Hyperlipidemia Brother   . Hypertension Brother   . Diverticulitis Daughter     S/P colectomy  . Colon cancer Maternal Grandmother   . Heart attack Paternal Grandmother 82  . Diabetes Neg Hx   . Stroke Neg Hx     ROS: no fevers or chills, productive cough, hemoptysis, dysphasia, odynophagia, melena, hematochezia, dysuria, hematuria, rash, seizure activity, orthopnea, PND, claudication. Remaining systems are negative.  Physical Exam:   There were no vitals taken for this visit.  General:  Well developed/well nourished in NAD Skin warm/dry Patient not depressed No peripheral clubbing Back-normal HEENT-normal/normal eyelids Neck supple/normal carotid upstroke bilaterally; no bruits; no JVD; no thyromegaly chest - CTA/ normal expansion CV - RRR/normal S1 and S2; no rubs or gallops;  PMI nondisplaced, 2/6 systolic murmur left sternal border. Abdomen -NT/ND, no HSM, no mass, + bowel sounds, no bruit 2+ femoral pulses, no bruits Ext-trace edema, no chords, chronic varicosities Neuro-grossly nonfocal  ECG sinus  bradycardia at a rate of 55. Right bundle branch block.

## 2015-03-28 NOTE — Interval H&P Note (Signed)
History and Physical Interval Note:  03/28/2015 10:18 AM  Valerie Rollins  has presented today for surgery, with the diagnosis of myxoma  The various methods of treatment have been discussed with the patient and family. After consideration of risks, benefits and other options for treatment, the patient has consented to  Procedure(s): Left Heart Cath and Coronary Angiography (N/A) as a surgical intervention .  The patient's history has been reviewed, patient examined, no change in status, stable for surgery.  I have reviewed the patient's chart and labs.  Questions were answered to the patient's satisfaction.     Audre Cenci, Quillian Quince

## 2015-03-28 NOTE — Progress Notes (Signed)
PCP is Unice Cobble, MD Referring Provider is Lelon Perla, MD  Chief Complaint  Patient presents with  . Coronary Artery Disease    discuss CABG scheduled for 04/01/15, Cardiac Cath 5/6/216, 2D ECHO 03/20/15  patient examined, 2-D echocardiogram and coronary angiogram personally reviewed  HPI: 79 year old Caucasian female recently diagnosed with a 3 cm left atrial myxoma. This was asymptomatic but echocardiogram was performed because of increased cardiac size on routine chest x-ray. LV function is normal. There is mild MR. 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 Patient with history of atrial fibrillation after surgery for left ankle fracture 2014. Patient has history of cardiac murmur for several years. Patient underwent left heart cath today demonstrating 80% proximal tubular LAD stenosis without significant other coronary disease.  Patient has been asymptomatic for chest pain, presyncope, dyspnea exertion, orthopnea, or PND. No family history of atrial myxoma.  Patient has history of severe bilateral varicose veins and status post vein stripping several years ago. Patient had atrial fibrillation following surgery for a left ankle fracture 2 years ago and took Noac for a few months but is now off. Patient has no symptoms of TIA or stroke. Carotid duplex in the past has been negative.  Past Medical History  Diagnosis Date  . Diverticulitis 2002    based on CT scan  . Hyperlipidemia   . Hypertension   . Superficial phlebitis      X 2, 1999, 2000  . Ocular hypertension     glaucoma suspect, Dr. Kathrin Penner  . Skin cancer   . Endometriosis   . Gastritis 2006    @ Endo  . Hiatal hernia   . Diverticulosis     Dr Henrene Pastor  . GERD (gastroesophageal reflux disease)   . DVT (deep venous thrombosis) 2000    no trigger  . Syncope 11/12/11    in context CAP . Carotid Doppler exam was negative. 2D echocardiogram revealed mild dilation of the left atrium and moderate  increase in the systolic pressureof  the pulmonary artery  . Atrial fibrillation     Post op after repair of ankle fracture  . UTI (lower urinary tract infection) 01/2013    Strep bovis ; PCN sensitive  . IBS (irritable bowel syndrome)     Past Surgical History  Procedure Laterality Date  . Total abdominal hysterectomy w/ bilateral salpingoophorectomy  1980    dysfunctional menses, endometriosis  . Varicose vein surgery      1960, 1965, 2009  . Tonsillectomy    . Cataract extraction w/ intraocular lens implant      OS; Dr Kathrin Penner  . Colonoscopy  1995, 2002, 2012    diverticulosis; Dr Henrene Pastor  . Orif ankle fracture  11/14/2011    Procedure: OPEN REDUCTION INTERNAL FIXATION (ORIF) ANKLE FRACTURE;  Surgeon: Colin Rhein;  Location: [facility];  Service: Orthopedics;  Laterality: Left;  open reduction internal fixation trimalleolar ankle fracture  . Cardiac catheterization N/A 03/28/2015    Procedure: Left Heart Cath and Coronary Angiography;  Surgeon: Jolaine Artist, MD;  Location: [facility];  Service: Cardiovascular;  Laterality: N/A;    Family History  Problem Relation Age of Onset  . Heart attack Father 79  . Alzheimer's disease Mother     TIAs  . Hyperlipidemia Brother   . Hypertension Brother   . Diverticulitis Daughter     S/P colectomy  . Colon cancer Maternal Grandmother   . Heart attack Paternal Grandmother 82  .  Diabetes Neg Hx   . Stroke Neg Hx     Social History History  Substance Use Topics  . Smoking status: Never Smoker   . Smokeless tobacco: Never Used  . Alcohol Use: No    Current Outpatient Prescriptions  Medication Sig Dispense Refill  . aspirin EC 81 MG tablet Take 81 mg by mouth daily.    . calcium citrate-vitamin D (CITRACAL+D) 315-200 MG-UNIT per tablet Take 2 tablets by mouth 2 (two) times daily.    . carvedilol (COREG) 25 MG tablet Take 1 tablet (25 mg total) by mouth 2 (two) times daily with a meal. 180 tablet 3  .  Cholecalciferol (VITAMIN D3) 1000 UNITS CAPS Take 1,000 Units by mouth daily.     . colestipol (MICRONIZED COLESTIPOL HCL) 1 G tablet Take 1 tablet (1 g total) by mouth 2 (two) times daily. (Patient taking differently: Take 1 g by mouth as needed. ) 60 tablet 11  . dicyclomine (BENTYL) 20 MG tablet Take 1 tablet (20 mg total) by mouth every 6 (six) hours as needed for spasms. 180 tablet 3  . fexofenadine (ALLEGRA) 180 MG tablet Take 180 mg by mouth as needed.     . furosemide (LASIX) 20 MG tablet Take 1 tablet (20 mg total) by mouth daily. 90 tablet 3  . Ipratropium-Albuterol (COMBIVENT RESPIMAT) 20-100 MCG/ACT AERS respimat Inhale 1 puff into the lungs every 6 (six) hours. 4 g 5  . latanoprost (XALATAN) 0.005 % ophthalmic solution Place 1 drop into both eyes daily.      . montelukast (SINGULAIR) 10 MG tablet TAKE  ONE TABLET BY MOUTH NIGHTLY AT BEDTIME 90 tablet 1  . Multiple Vitamin (MULITIVITAMIN WITH MINERALS) TABS Take 1 tablet by mouth daily.    Marland Kitchen omeprazole (PRILOSEC) 20 MG capsule TAKE ONE CAPSULE BY MOUTH ONE TIME DAILY 90 capsule 3  . traMADol (ULTRAM) 50 MG tablet Take 50 mg by mouth every 6 (six) hours as needed for pain. Dr.Ramos     Current Facility-Administered Medications  Medication Dose Route Frequency Provider Last Rate Last Dose  . pneumococcal 13-valent conjugate vaccine (PREVNAR 13) injection 0.5 mL  0.5 mL Intramuscular Tomorrow-1000 Hendricks Limes, MD        Allergies  Allergen Reactions  . Oxybutynin Chloride     Tongue swelling; Rx from Dr Hessie Diener  . Propoxyphene N-Acetaminophen     REACTION: made pt blood to thin  . Alendronate Sodium     ? reaction  . Ramipril     REACTION: cough    Review of Systems    Review of Systems       General:  No weight loss   no fever   no decreased energy  no night sweats Cardiac: - Chest pain with exertion - resting chest pain  - SOB with exertion   -Orthopnea                  -  +PND  ankle edema   -syncope Pulmonary:  no dyspnea,no cough, no productive cough no home oxygen no hemoptysis GI: no difficulty swallowing  no GERD no jaundice  no melena  no hematemesis no        abdominal pain GU:  no dysuria  no hematuria  no frequent UTI no BPH Vascular:  no claudication  No TIA +aricose veins + DVT Neuro:  no sroke no seizures no TIA no head trauma no vision changes Musculoskeletal:  normal mobility no arthritis  no gout  no joint swelling Skin: no rash  no skin ulceration  no skin cancer Endocrine: diabetes  no thyroid didease Hematologic: no easy bruising  no blood transfusions  no frequent epistaxis ENT : no painful teeth no dentures no loose teeth Psych : no anxiety  no depression  o psych hospitalizations     The patient has no problems with general anesthesia or bleeding diathesis with surgery.   BP 132/82 mmHg  Pulse 86  Resp 20  Ht 5' 5.5" (1.664 m)  Wt 178 lb (80.74 kg)  BMI 29.16 kg/m2  SpO2 98% Physical Exam      Physical Exam  General: Pleasant overweight 78 row Caucasian female no acute distress. HEENT: Normocephalic pupils equal , dentition adequate Neck: Supple without JVD, adenopathy, or bruit Chest: Clear to auscultation, symmetrical breath sounds, no rhonchi, no tenderness             or deformity Cardiovascular: Regular rate and rhythm, + holosystolic murmur, no gallop, peripheral pulses             palpable in all extremities, severe varicose veins in both lower extremities Abdomen:  Soft, nontender, no palpable mass or organomegaly Extremities: Warm, well-perfused, no clubbing cyanosis  2+ edema bilateral              2+ venous stasis changes of the legs Rectal/GU: Deferred Neuro: Grossly non--focal and symmetrical throughout Skin: Clean and dry without rash or ulceration   Diagnostic Tests: Echocardiogram shows 3 cm round atrial myxoma the left atrium attached interatrial septum Coronary angiograms demonstrated 80% proximal tubular LAD  stenosis Normal LV systolic function History of atrial fibrillation now off anticoagulation  Impression: Patient we prepared for surgery next week for excision of left atrial myxoma with combined CABG--left IMA to LAD. Will plan for oversewn left atrial appendage at the time of surgery because of a history of atrial fibrillation.  Plan:resection of left atrial myxoma with combined CABG scheduled at [facility] hospital May 10. Procedure indications benefits alternatives and risks discussed in detail the patient and family. She demonstrates understanding and agreed to proceed with surgery.   Len Childs, MD [facility] [phone]

## 2015-03-31 ENCOUNTER — Ambulatory Visit (HOSPITAL_COMMUNITY)
Admission: RE | Admit: 2015-03-31 | Discharge: 2015-03-31 | Disposition: A | Discharge status: Home / Self Care | Payer: PPO | Source: Ambulatory Visit | Attending: Cardiothoracic Surgery | Admitting: Cardiothoracic Surgery

## 2015-03-31 ENCOUNTER — Encounter (HOSPITAL_COMMUNITY)
Admission: RE | Admit: 2015-03-31 | Discharge: 2015-03-31 | Disposition: A | Discharge status: Home / Self Care | Payer: PPO | Source: Ambulatory Visit | Attending: Cardiothoracic Surgery | Admitting: Cardiothoracic Surgery

## 2015-03-31 VITALS — BP 164/77 | HR 57 | Temp 98.3°F | Resp 18 | Ht 65.5 in | Wt 178.1 lb

## 2015-03-31 DIAGNOSIS — I10 Essential (primary) hypertension: Secondary | ICD-10-CM | POA: Insufficient documentation

## 2015-03-31 DIAGNOSIS — Z79899 Other long term (current) drug therapy: Secondary | ICD-10-CM

## 2015-03-31 DIAGNOSIS — I6523 Occlusion and stenosis of bilateral carotid arteries: Secondary | ICD-10-CM

## 2015-03-31 DIAGNOSIS — D151 Benign neoplasm of heart: Secondary | ICD-10-CM

## 2015-03-31 DIAGNOSIS — I2510 Atherosclerotic heart disease of native coronary artery without angina pectoris: Secondary | ICD-10-CM

## 2015-03-31 DIAGNOSIS — K219 Gastro-esophageal reflux disease without esophagitis: Secondary | ICD-10-CM | POA: Insufficient documentation

## 2015-03-31 DIAGNOSIS — Z86718 Personal history of other venous thrombosis and embolism: Secondary | ICD-10-CM

## 2015-03-31 DIAGNOSIS — E785 Hyperlipidemia, unspecified: Secondary | ICD-10-CM | POA: Insufficient documentation

## 2015-03-31 DIAGNOSIS — Z0183 Encounter for blood typing: Secondary | ICD-10-CM | POA: Insufficient documentation

## 2015-03-31 DIAGNOSIS — I5189 Other ill-defined heart diseases: Secondary | ICD-10-CM | POA: Diagnosis not present

## 2015-03-31 DIAGNOSIS — Z01812 Encounter for preprocedural laboratory examination: Secondary | ICD-10-CM | POA: Insufficient documentation

## 2015-03-31 DIAGNOSIS — Z01818 Encounter for other preprocedural examination: Secondary | ICD-10-CM | POA: Insufficient documentation

## 2015-03-31 DIAGNOSIS — Z7982 Long term (current) use of aspirin: Secondary | ICD-10-CM | POA: Insufficient documentation

## 2015-03-31 LAB — PULMONARY FUNCTION TEST
DL/VA % pred: 85 %
DL/VA: 4.23 ml/min/mmHg/L
DLCO unc % pred: 57 %
DLCO unc: 14.8 ml/min/mmHg
FEF 25-75 Pre: 1.08 L/sec
FEF2575-%Pred-Pre: 65 %
FEV1-%Pred-Pre: 72 %
FEV1-Pre: 1.56 L
FEV1FVC-%Pred-Pre: 98 %
FEV6-%Pred-Pre: 77 %
FEV6-Pre: 2.1 L
FEV6FVC-%Pred-Pre: 104 %
FVC-%Pred-Pre: 73 %
FVC-Pre: 2.12 L
Pre FEV1/FVC ratio: 74 %
Pre FEV6/FVC Ratio: 99 %
RV % pred: 94 %
RV: 2.24 L
TLC % pred: 84 %
TLC: 4.4 L

## 2015-03-31 LAB — BLOOD GAS, ARTERIAL
Acid-Base Excess: 2.8 mmol/L — ABNORMAL HIGH (ref 0.0–2.0)
Bicarbonate: 26.7 mEq/L — ABNORMAL HIGH (ref 20.0–24.0)
Drawn by: 1350
FIO2: 0.21 %
O2 Saturation: 96.7 %
Patient temperature: 98.6
TCO2: 27.9 mmol/L (ref 0–100)
pCO2 arterial: 39.9 mmHg (ref 35.0–45.0)
pH, Arterial: 7.44 (ref 7.350–7.450)
pO2, Arterial: 78.5 mmHg — ABNORMAL LOW (ref 80.0–100.0)

## 2015-03-31 LAB — COMPREHENSIVE METABOLIC PANEL
ALT: 17 U/L (ref 14–54)
AST: 23 U/L (ref 15–41)
Albumin: 3.7 g/dL (ref 3.5–5.0)
Alkaline Phosphatase: 73 U/L (ref 38–126)
Anion gap: 10 (ref 5–15)
BUN: 19 mg/dL (ref 6–20)
CO2: 23 mmol/L (ref 22–32)
Calcium: 9.5 mg/dL (ref 8.9–10.3)
Chloride: 105 mmol/L (ref 101–111)
Creatinine, Ser: 0.85 mg/dL (ref 0.44–1.00)
GFR calc Af Amer: >60 mL/min (ref >60)
GFR calc non Af Amer: >60 mL/min (ref >60)
Glucose, Bld: 100 mg/dL — ABNORMAL HIGH (ref 70–99)
Potassium: 4.1 mmol/L (ref 3.5–5.1)
Sodium: 138 mmol/L (ref 135–145)
Total Bilirubin: 0.6 mg/dL (ref 0.3–1.2)
Total Protein: 6.5 g/dL (ref 6.5–8.1)

## 2015-03-31 LAB — PROTIME-INR
INR: 1.07 (ref 0.00–1.49)
Prothrombin Time: 14 seconds (ref 11.6–15.2)

## 2015-03-31 LAB — CBC
HCT: 39 % (ref 36.0–46.0)
Hemoglobin: 13 g/dL (ref 12.0–15.0)
MCH: 28.4 pg (ref 26.0–34.0)
MCHC: 33.3 g/dL (ref 30.0–36.0)
MCV: 85.2 fL (ref 78.0–100.0)
Platelets: 246 10*3/uL (ref 150–400)
RBC: 4.58 MIL/uL (ref 3.87–5.11)
RDW: 13.2 % (ref 11.5–15.5)
WBC: 6.5 10*3/uL (ref 4.0–10.5)

## 2015-03-31 LAB — URINALYSIS, ROUTINE W REFLEX MICROSCOPIC
Bilirubin Urine: NEGATIVE
Glucose, UA: NEGATIVE mg/dL
Hgb urine dipstick: NEGATIVE
Ketones, ur: NEGATIVE mg/dL
Leukocytes, UA: NEGATIVE
Nitrite: NEGATIVE
Protein, ur: NEGATIVE mg/dL
Specific Gravity, Urine: 1.02 (ref 1.005–1.030)
Urobilinogen, UA: 1 mg/dL (ref 0.0–1.0)
pH: 6.5 (ref 5.0–8.0)

## 2015-03-31 LAB — ABO/RH: ABO/RH(D): B NEG

## 2015-03-31 LAB — APTT: aPTT: 30 seconds (ref 24–37)

## 2015-03-31 LAB — TYPE AND SCREEN
ABO/RH(D): B NEG
Antibody Screen: NEGATIVE

## 2015-03-31 LAB — SURGICAL PCR SCREEN
MRSA, PCR: NEGATIVE
Staphylococcus aureus: NEGATIVE

## 2015-03-31 MED ORDER — CEFUROXIME SODIUM 1.5 G IJ SOLR
1.5000 g | INTRAMUSCULAR | Status: AC
  Administered 2015-04-01: 1.5 g via INTRAVENOUS
  Administered 2015-04-01: .75 g via INTRAVENOUS
  Filled 2015-03-31: qty 1.5

## 2015-03-31 MED ORDER — AMINOCAPROIC ACID 250 MG/ML IV SOLN
INTRAVENOUS | Status: AC
  Administered 2015-04-01: 70 mL/h via INTRAVENOUS
  Filled 2015-03-31: qty 40

## 2015-03-31 MED ORDER — CHLORHEXIDINE GLUCONATE 4 % EX LIQD: 30.0000 mL | CUTANEOUS | Status: DC

## 2015-03-31 MED ORDER — METOPROLOL TARTRATE 12.5 MG HALF TABLET: 12.5000 mg | ORAL_TABLET | ORAL | Status: DC

## 2015-03-31 MED ORDER — PAPAVERINE HCL 30 MG/ML IJ SOLN
INTRAMUSCULAR | Status: AC
  Administered 2015-04-01: 500 mL
  Filled 2015-03-31: qty 2.5

## 2015-03-31 MED ORDER — CEFUROXIME SODIUM 750 MG IJ SOLR
750.0000 mg | INTRAMUSCULAR | Status: DC
  Filled 2015-03-31: qty 750

## 2015-03-31 MED ORDER — POTASSIUM CHLORIDE 2 MEQ/ML IV SOLN
80.0000 meq | INTRAVENOUS | Status: DC
  Filled 2015-03-31: qty 40

## 2015-03-31 MED ORDER — DOPAMINE-DEXTROSE 3.2-5 MG/ML-% IV SOLN
0.0000 ug/kg/min | INTRAVENOUS | Status: AC
  Administered 2015-04-01: 3 ug/kg/min via INTRAVENOUS
  Filled 2015-03-31: qty 250

## 2015-03-31 MED ORDER — VANCOMYCIN HCL 10 G IV SOLR
1250.0000 mg | INTRAVENOUS | Status: AC
  Administered 2015-04-01: 1250 mg via INTRAVENOUS
  Filled 2015-03-31: qty 1250

## 2015-03-31 MED ORDER — SODIUM CHLORIDE 0.9 % IV SOLN
INTRAVENOUS | Status: AC
  Administered 2015-04-01: 1.1 [IU]/h via INTRAVENOUS
  Filled 2015-03-31: qty 2.5

## 2015-03-31 MED ORDER — NITROGLYCERIN IN D5W 200-5 MCG/ML-% IV SOLN
2.0000 ug/min | INTRAVENOUS | Status: AC
  Administered 2015-04-01: 10 ug/min via INTRAVENOUS
  Filled 2015-03-31: qty 250

## 2015-03-31 MED ORDER — SODIUM CHLORIDE 0.9 % IV SOLN
INTRAVENOUS | Status: DC
  Filled 2015-03-31: qty 30

## 2015-03-31 MED ORDER — MAGNESIUM SULFATE 50 % IJ SOLN
40.0000 meq | INTRAMUSCULAR | Status: DC
  Filled 2015-03-31: qty 10

## 2015-03-31 MED ORDER — PHENYLEPHRINE HCL 10 MG/ML IJ SOLN
30.0000 ug/min | INTRAVENOUS | Status: AC
  Administered 2015-04-01: 25 ug/min via INTRAVENOUS
  Filled 2015-03-31: qty 2

## 2015-03-31 MED ORDER — EPINEPHRINE HCL 1 MG/ML IJ SOLN
0.0000 ug/min | INTRAVENOUS | Status: DC
  Filled 2015-03-31: qty 4

## 2015-03-31 MED ORDER — DEXMEDETOMIDINE HCL IN NACL 400 MCG/100ML IV SOLN
0.1000 ug/kg/h | INTRAVENOUS | Status: AC
  Administered 2015-04-01: .3 ug/kg/h via INTRAVENOUS
  Filled 2015-03-31: qty 100

## 2015-03-31 MED FILL — Heparin Sodium (Porcine) 2 Unit/ML in Sodium Chloride 0.9%: INTRAMUSCULAR | Qty: 1500 | Status: AC

## 2015-03-31 MED FILL — Lidocaine HCl Local Preservative Free (PF) Inj 1%: INTRAMUSCULAR | Qty: 30 | Status: AC

## 2015-03-31 NOTE — Progress Notes (Signed)
VASCULAR LAB PRELIMINARY  PRELIMINARY  PRELIMINARY  PRELIMINARY  Pre-op Cardiac Surgery  Carotid Findings:  Bilateral:  1-39% ICA stenosis.  Vertebral artery flow is antegrade.      Upper Extremity Right Left  Brachial Pressures 155 147  Radial Waveforms Triphasic  Triphasic   Ulnar Waveforms Triphasic  Triphasic   Palmar Arch (Allen's Test) Within normal limits Obliterates with radial compression, normal with ulnar compression.      Valerie Rollins, RVT 03/31/2015, 4:49 PM

## 2015-03-31 NOTE — Pre-Procedure Instructions (Addendum)
[facility]  03/31/2015   Your procedure is scheduled on:  Apr 01, 2015  Report to [facility] Admitting at 5:30 AM.  Call this number if you have problems the morning of surgery: [phone]   Remember:   Do not eat food or drink liquids after midnight.   Take these medicines the morning of surgery with A SIP OF WATER: carvedilol (COREG),  omeprazole  (PRILOSEC),Ipratropium-Albuterol (COMBIVENT RESPIMAT)    IF NEEDED: traMADol (ULTRAM)     STOP NSAIDS (ADVIL, ALEVE, IBUPROFEN), VITAMINS   Do not wear jewelry, make-up or nail polish.  Do not wear lotions, powders, or perfumes. You may wear deodorant.  Do not shave 48 hours prior to surgery.   Do not bring valuables to the hospital.  [facility] is not responsible      for any belongings or valuables.               Contacts, dentures or bridgework may not be worn into surgery.  Leave suitcase in the car. After surgery it may be brought to your room.  For patients admitted to the hospital, discharge time is determined by your treatment team.               Patients discharged the day of surgery will not be allowed to drive  home.  Name and phone number of your driver: FAMILY/FRIEND  Special Instructions: "PREPARING FOR SURGERY"   Please read over the following fact sheets that you were given: Pain Booklet, Coughing and Deep Breathing, Blood Transfusion Information and Surgical Site Infection Prevention

## 2015-04-01 ENCOUNTER — Inpatient Hospital Stay (HOSPITAL_COMMUNITY): Payer: PPO

## 2015-04-01 ENCOUNTER — Encounter (HOSPITAL_COMMUNITY)
Admission: RE | Disposition: A | Discharge status: Home Health | Payer: PPO | Source: Ambulatory Visit | Attending: Cardiothoracic Surgery

## 2015-04-01 ENCOUNTER — Inpatient Hospital Stay (HOSPITAL_COMMUNITY): Payer: PPO | Admitting: Anesthesiology

## 2015-04-01 ENCOUNTER — Inpatient Hospital Stay (HOSPITAL_COMMUNITY)
Admission: RE | Admit: 2015-04-01 | Discharge: 2015-04-10 | DRG: 228 | Disposition: A | Discharge status: Home Health | Payer: PPO | Source: Ambulatory Visit | Attending: Cardiothoracic Surgery | Admitting: Cardiothoracic Surgery

## 2015-04-01 ENCOUNTER — Encounter (HOSPITAL_COMMUNITY): Payer: Self-pay | Admitting: *Deleted

## 2015-04-01 DIAGNOSIS — R001 Bradycardia, unspecified: Secondary | ICD-10-CM | POA: Diagnosis not present

## 2015-04-01 DIAGNOSIS — K219 Gastro-esophageal reflux disease without esophagitis: Secondary | ICD-10-CM | POA: Diagnosis present

## 2015-04-01 DIAGNOSIS — Z419 Encounter for procedure for purposes other than remedying health state, unspecified: Secondary | ICD-10-CM

## 2015-04-01 DIAGNOSIS — Z79899 Other long term (current) drug therapy: Secondary | ICD-10-CM

## 2015-04-01 DIAGNOSIS — Z7982 Long term (current) use of aspirin: Secondary | ICD-10-CM | POA: Diagnosis not present

## 2015-04-01 DIAGNOSIS — I2510 Atherosclerotic heart disease of native coronary artery without angina pectoris: Secondary | ICD-10-CM | POA: Diagnosis present

## 2015-04-01 DIAGNOSIS — Z86718 Personal history of other venous thrombosis and embolism: Secondary | ICD-10-CM | POA: Diagnosis not present

## 2015-04-01 DIAGNOSIS — Z951 Presence of aortocoronary bypass graft: Secondary | ICD-10-CM | POA: Diagnosis not present

## 2015-04-01 DIAGNOSIS — L03119 Cellulitis of unspecified part of limb: Secondary | ICD-10-CM | POA: Diagnosis not present

## 2015-04-01 DIAGNOSIS — D151 Benign neoplasm of heart: Secondary | ICD-10-CM | POA: Diagnosis present

## 2015-04-01 DIAGNOSIS — Z8249 Family history of ischemic heart disease and other diseases of the circulatory system: Secondary | ICD-10-CM

## 2015-04-01 DIAGNOSIS — I5189 Other ill-defined heart diseases: Secondary | ICD-10-CM | POA: Diagnosis present

## 2015-04-01 DIAGNOSIS — Z7901 Long term (current) use of anticoagulants: Secondary | ICD-10-CM | POA: Diagnosis not present

## 2015-04-01 DIAGNOSIS — I5033 Acute on chronic diastolic (congestive) heart failure: Secondary | ICD-10-CM | POA: Diagnosis not present

## 2015-04-01 DIAGNOSIS — T462X5A Adverse effect of other antidysrhythmic drugs, initial encounter: Secondary | ICD-10-CM | POA: Diagnosis not present

## 2015-04-01 DIAGNOSIS — K589 Irritable bowel syndrome without diarrhea: Secondary | ICD-10-CM | POA: Diagnosis present

## 2015-04-01 DIAGNOSIS — I480 Paroxysmal atrial fibrillation: Secondary | ICD-10-CM | POA: Diagnosis present

## 2015-04-01 DIAGNOSIS — E876 Hypokalemia: Secondary | ICD-10-CM | POA: Diagnosis not present

## 2015-04-01 DIAGNOSIS — Z820 Family history of epilepsy and other diseases of the nervous system: Secondary | ICD-10-CM

## 2015-04-01 DIAGNOSIS — D62 Acute posthemorrhagic anemia: Secondary | ICD-10-CM | POA: Diagnosis not present

## 2015-04-01 DIAGNOSIS — I10 Essential (primary) hypertension: Secondary | ICD-10-CM | POA: Diagnosis present

## 2015-04-01 DIAGNOSIS — E785 Hyperlipidemia, unspecified: Secondary | ICD-10-CM | POA: Diagnosis present

## 2015-04-01 DIAGNOSIS — I4891 Unspecified atrial fibrillation: Secondary | ICD-10-CM | POA: Diagnosis present

## 2015-04-01 HISTORY — PX: EXCISION OF ATRIAL MYXOMA: SHX5821

## 2015-04-01 HISTORY — PX: TEE WITHOUT CARDIOVERSION: SHX5443

## 2015-04-01 HISTORY — PX: CORONARY ARTERY BYPASS GRAFT: SHX141

## 2015-04-01 LAB — POCT I-STAT, CHEM 8
BUN: 17 mg/dL (ref 6–20)
BUN: 15 mg/dL (ref 6–20)
BUN: 15 mg/dL (ref 6–20)
BUN: 13 mg/dL (ref 6–20)
BUN: 9 mg/dL (ref 6–20)
Calcium, Ion: 1.25 mmol/L (ref 1.13–1.30)
Calcium, Ion: 1.25 mmol/L (ref 1.13–1.30)
Calcium, Ion: 1.03 mmol/L — ABNORMAL LOW (ref 1.13–1.30)
Calcium, Ion: 1.18 mmol/L (ref 1.13–1.30)
Calcium, Ion: 1.2 mmol/L (ref 1.13–1.30)
Chloride: 104 mmol/L (ref 101–111)
Chloride: 104 mmol/L (ref 101–111)
Chloride: 103 mmol/L (ref 101–111)
Chloride: 105 mmol/L (ref 101–111)
Chloride: 105 mmol/L (ref 101–111)
Creatinine, Ser: 0.7 mg/dL (ref 0.44–1.00)
Creatinine, Ser: 0.6 mg/dL (ref 0.44–1.00)
Creatinine, Ser: 0.6 mg/dL (ref 0.44–1.00)
Creatinine, Ser: 0.5 mg/dL (ref 0.44–1.00)
Creatinine, Ser: 0.6 mg/dL (ref 0.44–1.00)
Glucose, Bld: 115 mg/dL — ABNORMAL HIGH (ref 70–99)
Glucose, Bld: 104 mg/dL — ABNORMAL HIGH (ref 70–99)
Glucose, Bld: 134 mg/dL — ABNORMAL HIGH (ref 70–99)
Glucose, Bld: 139 mg/dL — ABNORMAL HIGH (ref 70–99)
Glucose, Bld: 133 mg/dL — ABNORMAL HIGH (ref 70–99)
HCT: 31 % — ABNORMAL LOW (ref 36.0–46.0)
HCT: 29 % — ABNORMAL LOW (ref 36.0–46.0)
HCT: 23 % — ABNORMAL LOW (ref 36.0–46.0)
HCT: 23 % — ABNORMAL LOW (ref 36.0–46.0)
HCT: 33 % — ABNORMAL LOW (ref 36.0–46.0)
Hemoglobin: 10.5 g/dL — ABNORMAL LOW (ref 12.0–15.0)
Hemoglobin: 9.9 g/dL — ABNORMAL LOW (ref 12.0–15.0)
Hemoglobin: 7.8 g/dL — ABNORMAL LOW (ref 12.0–15.0)
Hemoglobin: 7.8 g/dL — ABNORMAL LOW (ref 12.0–15.0)
Hemoglobin: 11.2 g/dL — ABNORMAL LOW (ref 12.0–15.0)
Potassium: 3.4 mmol/L — ABNORMAL LOW (ref 3.5–5.1)
Potassium: 3.3 mmol/L — ABNORMAL LOW (ref 3.5–5.1)
Potassium: 4 mmol/L (ref 3.5–5.1)
Potassium: 3.4 mmol/L — ABNORMAL LOW (ref 3.5–5.1)
Potassium: 4 mmol/L (ref 3.5–5.1)
Sodium: 140 mmol/L (ref 135–145)
Sodium: 140 mmol/L (ref 135–145)
Sodium: 140 mmol/L (ref 135–145)
Sodium: 140 mmol/L (ref 135–145)
Sodium: 141 mmol/L (ref 135–145)
TCO2: 23 mmol/L (ref 0–100)
TCO2: 23 mmol/L (ref 0–100)
TCO2: 24 mmol/L (ref 0–100)
TCO2: 22 mmol/L (ref 0–100)
TCO2: 19 mmol/L (ref 0–100)

## 2015-04-01 LAB — CARBOXYHEMOGLOBIN
Carboxyhemoglobin: 0.9 % (ref 0.5–1.5)
Methemoglobin: 1.2 % (ref 0.0–1.5)
O2 Saturation: 54.9 %
Total hemoglobin: 11.9 g/dL — ABNORMAL LOW (ref 12.0–16.0)

## 2015-04-01 LAB — CBC
HCT: 33 % — ABNORMAL LOW (ref 36.0–46.0)
HCT: 33.5 % — ABNORMAL LOW (ref 36.0–46.0)
HCT: 33.3 % — ABNORMAL LOW (ref 36.0–46.0)
HEMOGLOBIN: 11.3 g/dL — AB (ref 12.0–15.0)
Hemoglobin: 11.5 g/dL — ABNORMAL LOW (ref 12.0–15.0)
Hemoglobin: 11.3 g/dL — ABNORMAL LOW (ref 12.0–15.0)
MCH: 29 pg (ref 26.0–34.0)
MCH: 28.5 pg (ref 26.0–34.0)
MCH: 28.3 pg (ref 26.0–34.0)
MCHC: 34.2 g/dL (ref 30.0–36.0)
MCHC: 34.3 g/dL (ref 30.0–36.0)
MCHC: 33.9 g/dL (ref 30.0–36.0)
MCV: 84.8 fL (ref 78.0–100.0)
MCV: 82.9 fL (ref 78.0–100.0)
MCV: 83.5 fL (ref 78.0–100.0)
Platelets: 135 10*3/uL — ABNORMAL LOW (ref 150–400)
Platelets: 142 10*3/uL — ABNORMAL LOW (ref 150–400)
Platelets: 153 10*3/uL (ref 150–400)
RBC: 3.89 MIL/uL (ref 3.87–5.11)
RBC: 4.04 MIL/uL (ref 3.87–5.11)
RBC: 3.99 MIL/uL (ref 3.87–5.11)
RDW: 13 % (ref 11.5–15.5)
RDW: 13 % (ref 11.5–15.5)
RDW: 12.9 % (ref 11.5–15.5)
WBC: 8.8 10*3/uL (ref 4.0–10.5)
WBC: 9.5 10*3/uL (ref 4.0–10.5)
WBC: 9.6 10*3/uL (ref 4.0–10.5)

## 2015-04-01 LAB — POCT I-STAT 4, (NA,K, GLUC, HGB,HCT)
Glucose, Bld: 145 mg/dL — ABNORMAL HIGH (ref 70–99)
HCT: 33 % — ABNORMAL LOW (ref 36.0–46.0)
Hemoglobin: 11.2 g/dL — ABNORMAL LOW (ref 12.0–15.0)
Potassium: 3.1 mmol/L — ABNORMAL LOW (ref 3.5–5.1)
Sodium: 139 mmol/L (ref 135–145)

## 2015-04-01 LAB — POCT I-STAT 3, ART BLOOD GAS (G3+)
Acid-Base Excess: 3 mmol/L — ABNORMAL HIGH (ref 0.0–2.0)
Acid-Base Excess: 1 mmol/L (ref 0.0–2.0)
Acid-base deficit: 2 mmol/L (ref 0.0–2.0)
Bicarbonate: 26.7 mEq/L — ABNORMAL HIGH (ref 20.0–24.0)
Bicarbonate: 24.9 mEq/L — ABNORMAL HIGH (ref 20.0–24.0)
Bicarbonate: 22 mEq/L (ref 20.0–24.0)
O2 Saturation: 100 %
O2 Saturation: 100 %
O2 Saturation: 96 %
Patient temperature: 35.8
TCO2: 28 mmol/L (ref 0–100)
TCO2: 26 mmol/L (ref 0–100)
TCO2: 23 mmol/L (ref 0–100)
pCO2 arterial: 34.8 mmHg — ABNORMAL LOW (ref 35.0–45.0)
pCO2 arterial: 36.6 mmHg (ref 35.0–45.0)
pCO2 arterial: 33.2 mmHg — ABNORMAL LOW (ref 35.0–45.0)
pH, Arterial: 7.493 — ABNORMAL HIGH (ref 7.350–7.450)
pH, Arterial: 7.441 (ref 7.350–7.450)
pH, Arterial: 7.425 (ref 7.350–7.450)
pO2, Arterial: 330 mmHg — ABNORMAL HIGH (ref 80.0–100.0)
pO2, Arterial: 264 mmHg — ABNORMAL HIGH (ref 80.0–100.0)
pO2, Arterial: 75 mmHg — ABNORMAL LOW (ref 80.0–100.0)

## 2015-04-01 LAB — GLUCOSE, CAPILLARY
Glucose-Capillary: 125 mg/dL — ABNORMAL HIGH (ref 70–99)
Glucose-Capillary: 109 mg/dL — ABNORMAL HIGH (ref 70–99)
Glucose-Capillary: 120 mg/dL — ABNORMAL HIGH (ref 70–99)
Glucose-Capillary: 139 mg/dL — ABNORMAL HIGH (ref 70–99)
Glucose-Capillary: 124 mg/dL — ABNORMAL HIGH (ref 70–99)

## 2015-04-01 LAB — HEMOGLOBIN AND HEMATOCRIT, BLOOD
HCT: 23.6 % — ABNORMAL LOW (ref 36.0–46.0)
Hemoglobin: 8 g/dL — ABNORMAL LOW (ref 12.0–15.0)

## 2015-04-01 LAB — POCT I-STAT GLUCOSE
Glucose, Bld: 104 mg/dL — ABNORMAL HIGH (ref 70–99)
Operator id: 3406

## 2015-04-01 LAB — MAGNESIUM: Magnesium: 2.8 mg/dL — ABNORMAL HIGH (ref 1.7–2.4)

## 2015-04-01 LAB — CREATININE, SERUM
Creatinine, Ser: 0.64 mg/dL (ref 0.44–1.00)
GFR calc Af Amer: >60 mL/min (ref >60)
GFR calc non Af Amer: >60 mL/min (ref >60)

## 2015-04-01 LAB — HEMOGLOBIN A1C
Hgb A1c MFr Bld: 5.8 % — ABNORMAL HIGH (ref 4.8–5.6)
Mean Plasma Glucose: 120 mg/dL

## 2015-04-01 LAB — APTT: aPTT: 37 seconds (ref 24–37)

## 2015-04-01 LAB — PLATELET COUNT: Platelets: 135 10*3/uL — ABNORMAL LOW (ref 150–400)

## 2015-04-01 LAB — PROTIME-INR
INR: 1.47 (ref 0.00–1.49)
PROTHROMBIN TIME: 18 s — AB (ref 11.6–15.2)

## 2015-04-01 SURGERY — CORONARY ARTERY BYPASS GRAFTING (CABG)
Anesthesia: General | Site: Chest

## 2015-04-01 MED ORDER — ALBUMIN HUMAN 5 % IV SOLN
250.0000 mL | INTRAVENOUS | Status: AC | PRN
  Administered 2015-04-01 (×4): 250 mL via INTRAVENOUS
  Filled 2015-04-01 (×2): qty 250

## 2015-04-01 MED ORDER — INSULIN REGULAR BOLUS VIA INFUSION
0.0000 [IU] | Freq: Three times a day (TID) | INTRAVENOUS | Status: DC
  Filled 2015-04-01: qty 10

## 2015-04-01 MED ORDER — SODIUM CHLORIDE 0.9 % IV SOLN: 250.0000 mL | INTRAVENOUS | Status: DC

## 2015-04-01 MED ORDER — METOCLOPRAMIDE HCL 5 MG/ML IJ SOLN
10.0000 mg | Freq: Four times a day (QID) | INTRAMUSCULAR | Status: AC
  Administered 2015-04-01 – 2015-04-02 (×4): 10 mg via INTRAVENOUS
  Filled 2015-04-01 (×3): qty 2

## 2015-04-01 MED ORDER — FENTANYL CITRATE (PF) 250 MCG/5ML IJ SOLN
INTRAMUSCULAR | Status: AC
  Filled 2015-04-01: qty 5

## 2015-04-01 MED ORDER — ARTIFICIAL TEARS OP OINT
TOPICAL_OINTMENT | OPHTHALMIC | Status: AC
  Filled 2015-04-01: qty 3.5

## 2015-04-01 MED ORDER — GLYCOPYRROLATE 0.2 MG/ML IJ SOLN
INTRAMUSCULAR | Status: AC
  Filled 2015-04-01: qty 2

## 2015-04-01 MED ORDER — LATANOPROST 0.005 % OP SOLN
1.0000 [drp] | Freq: Every day | OPHTHALMIC | Status: DC
  Administered 2015-04-03 – 2015-04-09 (×7): 1 [drp] via OPHTHALMIC
  Filled 2015-04-01 (×2): qty 2.5

## 2015-04-01 MED ORDER — PROTAMINE SULFATE 10 MG/ML IV SOLN
INTRAVENOUS | Status: DC | PRN
  Administered 2015-04-01: 210 mg via INTRAVENOUS

## 2015-04-01 MED ORDER — PROTAMINE SULFATE 10 MG/ML IV SOLN
INTRAVENOUS | Status: AC
  Filled 2015-04-01: qty 25

## 2015-04-01 MED ORDER — BISACODYL 10 MG RE SUPP: 10.0000 mg | Freq: Every day | RECTAL | Status: DC

## 2015-04-01 MED ORDER — VITAMIN D3 25 MCG (1000 UNIT) PO TABS
1000.0000 [IU] | ORAL_TABLET | Freq: Every day | ORAL | Status: DC
  Administered 2015-04-03 – 2015-04-10 (×8): 1000 [IU] via ORAL
  Filled 2015-04-01 (×10): qty 1

## 2015-04-01 MED ORDER — SODIUM CHLORIDE 0.9 % IV SOLN
INTRAVENOUS | Status: DC
  Administered 2015-04-01: 20 mL/h via INTRAVENOUS

## 2015-04-01 MED ORDER — PROPOFOL 10 MG/ML IV BOLUS
INTRAVENOUS | Status: AC
  Filled 2015-04-01: qty 20

## 2015-04-01 MED ORDER — ROCURONIUM BROMIDE 100 MG/10ML IV SOLN
INTRAVENOUS | Status: DC | PRN
  Administered 2015-04-01: 50 mg via INTRAVENOUS

## 2015-04-01 MED ORDER — ADULT MULTIVITAMIN W/MINERALS CH
1.0000 | ORAL_TABLET | Freq: Every day | ORAL | Status: DC
  Administered 2015-04-03 – 2015-04-10 (×8): 1 via ORAL
  Filled 2015-04-01 (×10): qty 1

## 2015-04-01 MED ORDER — NITROGLYCERIN IN D5W 200-5 MCG/ML-% IV SOLN: 0.0000 ug/min | INTRAVENOUS | Status: DC

## 2015-04-01 MED ORDER — POTASSIUM CHLORIDE 10 MEQ/50ML IV SOLN
10.0000 meq | Freq: Once | INTRAVENOUS | Status: AC
  Administered 2015-04-01: 10 meq via INTRAVENOUS

## 2015-04-01 MED ORDER — DOPAMINE-DEXTROSE 3.2-5 MG/ML-% IV SOLN: 3.0000 ug/kg/min | INTRAVENOUS | Status: DC

## 2015-04-01 MED ORDER — LACTATED RINGERS IV SOLN: INTRAVENOUS | Status: DC

## 2015-04-01 MED ORDER — MIDAZOLAM HCL 2 MG/2ML IJ SOLN: 2.0000 mg | INTRAMUSCULAR | Status: DC | PRN

## 2015-04-01 MED ORDER — HEPARIN SODIUM (PORCINE) 1000 UNIT/ML IJ SOLN
INTRAMUSCULAR | Status: AC
  Filled 2015-04-01: qty 1

## 2015-04-01 MED ORDER — METOPROLOL TARTRATE 25 MG/10 ML ORAL SUSPENSION
12.5000 mg | Freq: Two times a day (BID) | ORAL | Status: DC
  Filled 2015-04-01 (×7): qty 5

## 2015-04-01 MED ORDER — CETYLPYRIDINIUM CHLORIDE 0.05 % MT LIQD
7.0000 mL | Freq: Four times a day (QID) | OROMUCOSAL | Status: DC
  Administered 2015-04-01 – 2015-04-02 (×3): 7 mL via OROMUCOSAL

## 2015-04-01 MED ORDER — ASPIRIN EC 325 MG PO TBEC
325.0000 mg | DELAYED_RELEASE_TABLET | Freq: Every day | ORAL | Status: DC
  Administered 2015-04-02 – 2015-04-09 (×8): 325 mg via ORAL
  Filled 2015-04-01 (×9): qty 1

## 2015-04-01 MED ORDER — ACETAMINOPHEN 650 MG RE SUPP
650.0000 mg | Freq: Once | RECTAL | Status: AC
  Administered 2015-04-01: 650 mg via RECTAL

## 2015-04-01 MED ORDER — POTASSIUM CHLORIDE 10 MEQ/50ML IV SOLN
10.0000 meq | INTRAVENOUS | Status: AC
  Administered 2015-04-01 (×3): 10 meq via INTRAVENOUS

## 2015-04-01 MED ORDER — LIDOCAINE HCL (CARDIAC) 20 MG/ML IV SOLN
INTRAVENOUS | Status: DC | PRN
  Administered 2015-04-01: 70 mg via INTRAVENOUS

## 2015-04-01 MED ORDER — AMIODARONE HCL IN DEXTROSE 360-4.14 MG/200ML-% IV SOLN
60.0000 mg/h | INTRAVENOUS | Status: AC
  Administered 2015-04-01: 60 mg/h via INTRAVENOUS
  Filled 2015-04-01: qty 200

## 2015-04-01 MED ORDER — IPRATROPIUM-ALBUTEROL 0.5-2.5 (3) MG/3ML IN SOLN
3.0000 mL | Freq: Four times a day (QID) | RESPIRATORY_TRACT | Status: DC
  Administered 2015-04-01 – 2015-04-03 (×9): 3 mL via RESPIRATORY_TRACT
  Filled 2015-04-01 (×10): qty 3

## 2015-04-01 MED ORDER — MAGNESIUM SULFATE 4 GM/100ML IV SOLN
4.0000 g | Freq: Once | INTRAVENOUS | Status: AC
Start: 2015-04-01 — End: 2015-04-01
  Administered 2015-04-01: 4 g via INTRAVENOUS
  Filled 2015-04-01: qty 100

## 2015-04-01 MED ORDER — LORATADINE 10 MG PO TABS
10.0000 mg | ORAL_TABLET | Freq: Every day | ORAL | Status: DC
  Administered 2015-04-03 – 2015-04-10 (×8): 10 mg via ORAL
  Filled 2015-04-01 (×10): qty 1

## 2015-04-01 MED ORDER — OXYCODONE HCL 5 MG PO TABS
5.0000 mg | ORAL_TABLET | ORAL | Status: DC | PRN
  Administered 2015-04-02: 5 mg via ORAL
  Filled 2015-04-01: qty 1

## 2015-04-01 MED ORDER — VECURONIUM BROMIDE 10 MG IV SOLR
INTRAVENOUS | Status: AC
  Filled 2015-04-01: qty 10

## 2015-04-01 MED ORDER — ARTIFICIAL TEARS OP OINT
TOPICAL_OINTMENT | OPHTHALMIC | Status: DC | PRN
Start: 2015-04-01 — End: 2015-04-01
  Administered 2015-04-01: 1 via OPHTHALMIC
  Administered 2015-04-01: 4 via OPHTHALMIC

## 2015-04-01 MED ORDER — FENTANYL CITRATE (PF) 250 MCG/5ML IJ SOLN: INTRAMUSCULAR | Status: DC | PRN

## 2015-04-01 MED ORDER — PROPOFOL 10 MG/ML IV BOLUS
INTRAVENOUS | Status: DC | PRN
  Administered 2015-04-01: 50 mg via INTRAVENOUS

## 2015-04-01 MED ORDER — EPHEDRINE SULFATE 50 MG/ML IJ SOLN
INTRAMUSCULAR | Status: AC
  Filled 2015-04-01: qty 1

## 2015-04-01 MED ORDER — VANCOMYCIN HCL IN DEXTROSE 1-5 GM/200ML-% IV SOLN
1000.0000 mg | Freq: Once | INTRAVENOUS | Status: AC
  Administered 2015-04-01: 1000 mg via INTRAVENOUS
  Filled 2015-04-01: qty 200

## 2015-04-01 MED ORDER — MIDAZOLAM HCL 10 MG/2ML IJ SOLN
INTRAMUSCULAR | Status: AC
  Filled 2015-04-01: qty 2

## 2015-04-01 MED ORDER — VECURONIUM BROMIDE 10 MG IV SOLR
INTRAVENOUS | Status: DC | PRN
  Administered 2015-04-01 (×4): 5 mg via INTRAVENOUS

## 2015-04-01 MED ORDER — GLUTARALDEHYDE 0.625% SOAKING SOLUTION
TOPICAL | Status: AC
  Administered 2015-04-01: 1 via TOPICAL
  Filled 2015-04-01: qty 50

## 2015-04-01 MED ORDER — DEXMEDETOMIDINE HCL IN NACL 400 MCG/100ML IV SOLN
0.4000 ug/kg/h | INTRAVENOUS | Status: DC
  Filled 2015-04-01: qty 100

## 2015-04-01 MED ORDER — MORPHINE SULFATE 2 MG/ML IJ SOLN
2.0000 mg | INTRAMUSCULAR | Status: DC | PRN
  Administered 2015-04-02: 2 mg via INTRAVENOUS
  Filled 2015-04-01 (×2): qty 1

## 2015-04-01 MED ORDER — GLYCOPYRROLATE 0.2 MG/ML IJ SOLN
INTRAMUSCULAR | Status: DC | PRN
  Administered 2015-04-01: 0.4 mg via INTRAVENOUS

## 2015-04-01 MED ORDER — METOPROLOL TARTRATE 1 MG/ML IV SOLN
2.5000 mg | INTRAVENOUS | Status: DC | PRN
Start: 2015-04-01 — End: 2015-04-10
  Administered 2015-04-05: 2.5 mg via INTRAVENOUS
  Administered 2015-04-06 – 2015-04-07 (×4): 5 mg via INTRAVENOUS
  Filled 2015-04-01 (×5): qty 5

## 2015-04-01 MED ORDER — SODIUM CHLORIDE 0.9 % IJ SOLN
INTRAMUSCULAR | Status: AC
  Filled 2015-04-01: qty 20

## 2015-04-01 MED ORDER — PANTOPRAZOLE SODIUM 40 MG PO TBEC
40.0000 mg | DELAYED_RELEASE_TABLET | Freq: Every day | ORAL | Status: DC
Start: 2015-04-03 — End: 2015-04-10
  Administered 2015-04-03 – 2015-04-10 (×8): 40 mg via ORAL
  Filled 2015-04-01 (×8): qty 1

## 2015-04-01 MED ORDER — ONDANSETRON HCL 4 MG/2ML IJ SOLN: 4.0000 mg | Freq: Four times a day (QID) | INTRAMUSCULAR | Status: DC | PRN

## 2015-04-01 MED ORDER — ACETAMINOPHEN 500 MG PO TABS
1000.0000 mg | ORAL_TABLET | Freq: Four times a day (QID) | ORAL | Status: AC
  Administered 2015-04-01 – 2015-04-06 (×14): 1000 mg via ORAL
  Filled 2015-04-01 (×20): qty 2

## 2015-04-01 MED ORDER — ASPIRIN 81 MG PO CHEW
324.0000 mg | CHEWABLE_TABLET | Freq: Every day | ORAL | Status: DC
  Filled 2015-04-01 (×2): qty 4

## 2015-04-01 MED ORDER — FAMOTIDINE IN NACL 20-0.9 MG/50ML-% IV SOLN
20.0000 mg | Freq: Two times a day (BID) | INTRAVENOUS | Status: AC
  Administered 2015-04-01 (×2): 20 mg via INTRAVENOUS
  Filled 2015-04-01: qty 50

## 2015-04-01 MED ORDER — HEMOSTATIC AGENTS (NO CHARGE) OPTIME
TOPICAL | Status: DC | PRN
  Administered 2015-04-01 (×2): 1 via TOPICAL

## 2015-04-01 MED ORDER — SODIUM CHLORIDE 0.9 % IJ SOLN: 3.0000 mL | INTRAMUSCULAR | Status: DC | PRN

## 2015-04-01 MED ORDER — MIDAZOLAM HCL 2 MG/2ML IJ SOLN
INTRAMUSCULAR | Status: AC
  Administered 2015-04-01: 2 mg via INTRAVENOUS
  Administered 2015-04-01: 6 mg via INTRAVENOUS
  Administered 2015-04-01: 4 mg via INTRAVENOUS
  Administered 2015-04-01 (×2): 1 mg via INTRAVENOUS
  Administered 2015-04-01: 3 mg via INTRAVENOUS
  Administered 2015-04-01: 2 mg via INTRAVENOUS
  Administered 2015-04-01: 3 mg via INTRAVENOUS
  Filled 2015-04-01: qty 2

## 2015-04-01 MED ORDER — LACTATED RINGERS IV SOLN: 500.0000 mL | Freq: Once | INTRAVENOUS | Status: AC | PRN

## 2015-04-01 MED ORDER — SODIUM CHLORIDE 0.9 % IV SOLN
0.5000 g/h | Freq: Once | INTRAVENOUS | Status: DC
  Filled 2015-04-01: qty 20

## 2015-04-01 MED ORDER — ACETAMINOPHEN 160 MG/5ML PO SOLN
1000.0000 mg | Freq: Four times a day (QID) | ORAL | Status: AC
  Administered 2015-04-02 – 2015-04-03 (×4): 1000 mg
  Filled 2015-04-01 (×5): qty 40.6

## 2015-04-01 MED ORDER — PHENYLEPHRINE 40 MCG/ML (10ML) SYRINGE FOR IV PUSH (FOR BLOOD PRESSURE SUPPORT)
PREFILLED_SYRINGE | INTRAVENOUS | Status: AC
  Filled 2015-04-01: qty 10

## 2015-04-01 MED ORDER — LACTATED RINGERS IV SOLN
INTRAVENOUS | Status: DC | PRN
  Administered 2015-04-01: 06:00:00 via INTRAVENOUS

## 2015-04-01 MED ORDER — SODIUM CHLORIDE 0.9 % IJ SOLN
OROMUCOSAL | Status: DC | PRN
  Administered 2015-04-01 (×3): 4 mL via TOPICAL

## 2015-04-01 MED ORDER — PHENYLEPHRINE HCL 10 MG/ML IJ SOLN
0.0000 ug/min | INTRAVENOUS | Status: DC
  Administered 2015-04-01: 25 ug/min via INTRAVENOUS
  Administered 2015-04-01: 20 ug/min via INTRAVENOUS
  Filled 2015-04-01 (×2): qty 2

## 2015-04-01 MED ORDER — PANTOPRAZOLE SODIUM 40 MG PO TBEC: 40.0000 mg | DELAYED_RELEASE_TABLET | Freq: Every day | ORAL | Status: DC

## 2015-04-01 MED ORDER — MONTELUKAST SODIUM 10 MG PO TABS
10.0000 mg | ORAL_TABLET | Freq: Every day | ORAL | Status: DC
  Administered 2015-04-02 – 2015-04-09 (×8): 10 mg via ORAL
  Filled 2015-04-01 (×10): qty 1

## 2015-04-01 MED ORDER — CEFUROXIME SODIUM 1.5 G IJ SOLR
1.5000 g | Freq: Two times a day (BID) | INTRAMUSCULAR | Status: AC
  Administered 2015-04-01 – 2015-04-03 (×4): 1.5 g via INTRAVENOUS
  Filled 2015-04-01 (×4): qty 1.5

## 2015-04-01 MED ORDER — SODIUM CHLORIDE 0.9 % IJ SOLN
3.0000 mL | Freq: Two times a day (BID) | INTRAMUSCULAR | Status: DC
  Administered 2015-04-02 – 2015-04-08 (×10): 3 mL via INTRAVENOUS

## 2015-04-01 MED ORDER — 0.9 % SODIUM CHLORIDE (POUR BTL) OPTIME
TOPICAL | Status: DC | PRN
  Administered 2015-04-01: 6000 mL

## 2015-04-01 MED ORDER — ALBUMIN HUMAN 5 % IV SOLN
INTRAVENOUS | Status: DC | PRN
  Administered 2015-04-01 (×2): via INTRAVENOUS

## 2015-04-01 MED ORDER — DOCUSATE SODIUM 100 MG PO CAPS
200.0000 mg | ORAL_CAPSULE | Freq: Every day | ORAL | Status: DC
  Administered 2015-04-02 – 2015-04-10 (×8): 200 mg via ORAL
  Filled 2015-04-01 (×9): qty 2

## 2015-04-01 MED ORDER — TRAMADOL HCL 50 MG PO TABS
50.0000 mg | ORAL_TABLET | ORAL | Status: DC | PRN
  Administered 2015-04-02 (×2): 100 mg via ORAL
  Administered 2015-04-02: 50 mg via ORAL
  Administered 2015-04-04 – 2015-04-09 (×3): 100 mg via ORAL
  Filled 2015-04-01 (×6): qty 2
  Filled 2015-04-01: qty 1

## 2015-04-01 MED ORDER — PHENYLEPHRINE HCL 10 MG/ML IJ SOLN
INTRAMUSCULAR | Status: DC | PRN
  Administered 2015-04-01: 160 ug via INTRAVENOUS
  Administered 2015-04-01: 80 ug via INTRAVENOUS

## 2015-04-01 MED ORDER — BISACODYL 5 MG PO TBEC
10.0000 mg | DELAYED_RELEASE_TABLET | Freq: Every day | ORAL | Status: DC
  Administered 2015-04-02 – 2015-04-04 (×3): 10 mg via ORAL
  Filled 2015-04-01 (×5): qty 2

## 2015-04-01 MED ORDER — DEXMEDETOMIDINE HCL IN NACL 200 MCG/50ML IV SOLN
0.0000 ug/kg/h | INTRAVENOUS | Status: DC
Start: 2015-04-01 — End: 2015-04-02

## 2015-04-01 MED ORDER — METOPROLOL TARTRATE 12.5 MG HALF TABLET
12.5000 mg | ORAL_TABLET | Freq: Two times a day (BID) | ORAL | Status: DC
  Administered 2015-04-02 – 2015-04-04 (×4): 12.5 mg via ORAL
  Filled 2015-04-01 (×7): qty 1

## 2015-04-01 MED ORDER — CALCIUM CHLORIDE 10 % IV SOLN
INTRAVENOUS | Status: DC | PRN
  Administered 2015-04-01: 700 mg via INTRAVENOUS

## 2015-04-01 MED ORDER — LIDOCAINE HCL (CARDIAC) 20 MG/ML IV SOLN
INTRAVENOUS | Status: AC
  Filled 2015-04-01: qty 5

## 2015-04-01 MED ORDER — CHLORHEXIDINE GLUCONATE 0.12 % MT SOLN
15.0000 mL | Freq: Two times a day (BID) | OROMUCOSAL | Status: DC
  Administered 2015-04-01 – 2015-04-02 (×2): 15 mL via OROMUCOSAL
  Filled 2015-04-01 (×2): qty 15

## 2015-04-01 MED ORDER — ROCURONIUM BROMIDE 50 MG/5ML IV SOLN
INTRAVENOUS | Status: AC
  Filled 2015-04-01: qty 1

## 2015-04-01 MED ORDER — MORPHINE SULFATE 2 MG/ML IJ SOLN
1.0000 mg | INTRAMUSCULAR | Status: DC | PRN
  Administered 2015-04-01 – 2015-04-02 (×2): 2 mg via INTRAVENOUS
  Filled 2015-04-01: qty 1

## 2015-04-01 MED ORDER — ACETAMINOPHEN 160 MG/5ML PO SOLN: 650.0000 mg | Freq: Once | ORAL | Status: AC

## 2015-04-01 MED ORDER — AMIODARONE HCL IN DEXTROSE 360-4.14 MG/200ML-% IV SOLN
30.0000 mg/h | INTRAVENOUS | Status: DC
  Administered 2015-04-01 – 2015-04-03 (×3): 30 mg/h via INTRAVENOUS
  Filled 2015-04-01 (×7): qty 200

## 2015-04-01 MED ORDER — SODIUM CHLORIDE 0.9 % IV SOLN
INTRAVENOUS | Status: DC
  Administered 2015-04-01 (×2): 1.6 [IU]/h via INTRAVENOUS
  Filled 2015-04-01 (×2): qty 2.5

## 2015-04-01 MED ORDER — LACTATED RINGERS IV SOLN
INTRAVENOUS | Status: DC
  Administered 2015-04-01: 20 mL/h via INTRAVENOUS

## 2015-04-01 MED ORDER — STERILE WATER FOR INJECTION IJ SOLN
INTRAMUSCULAR | Status: AC
  Filled 2015-04-01: qty 10

## 2015-04-01 MED ORDER — HEPARIN SODIUM (PORCINE) 1000 UNIT/ML IJ SOLN
INTRAMUSCULAR | Status: DC | PRN
  Administered 2015-04-01: 21000 [IU] via INTRAVENOUS
  Administered 2015-04-01: 4000 [IU] via INTRAVENOUS

## 2015-04-01 MED ORDER — FENTANYL CITRATE (PF) 100 MCG/2ML IJ SOLN
INTRAMUSCULAR | Status: AC
Start: 2015-04-01 — End: 2015-04-01
  Administered 2015-04-01: 50 ug via INTRAVENOUS
  Administered 2015-04-01 (×3): 250 ug via INTRAVENOUS
  Administered 2015-04-01: 150 ug via INTRAVENOUS
  Administered 2015-04-01: 50 ug via INTRAVENOUS
  Administered 2015-04-01 (×2): 250 ug via INTRAVENOUS
  Administered 2015-04-01: 200 ug via INTRAVENOUS
  Administered 2015-04-01: 350 ug via INTRAVENOUS
  Administered 2015-04-01: 200 ug via INTRAVENOUS
  Filled 2015-04-01: qty 2

## 2015-04-01 MED ORDER — SODIUM CHLORIDE 0.45 % IV SOLN
INTRAVENOUS | Status: DC | PRN
  Administered 2015-04-01: 20 mL/h via INTRAVENOUS

## 2015-04-01 MED ORDER — SODIUM CHLORIDE 0.9 % IV SOLN
1.0000 g | Freq: Once | INTRAVENOUS | Status: AC
  Administered 2015-04-01: 1 g via INTRAVENOUS
  Filled 2015-04-01: qty 10

## 2015-04-01 MED FILL — Electrolyte-R (PH 7.4) Solution: INTRAVENOUS | Qty: 5000 | Status: AC

## 2015-04-01 MED FILL — Sodium Bicarbonate IV Soln 8.4%: INTRAVENOUS | Qty: 50 | Status: AC

## 2015-04-01 MED FILL — Sodium Chloride IV Soln 0.9%: INTRAVENOUS | Qty: 2000 | Status: AC

## 2015-04-01 MED FILL — Heparin Sodium (Porcine) Inj 1000 Unit/ML: INTRAMUSCULAR | Qty: 10 | Status: AC

## 2015-04-01 MED FILL — Lidocaine HCl IV Inj 20 MG/ML: INTRAVENOUS | Qty: 5 | Status: AC

## 2015-04-01 MED FILL — Mannitol IV Soln 20%: INTRAVENOUS | Qty: 500 | Status: AC

## 2015-04-01 SURGICAL SUPPLY — 97 items
ADAPTER CARDIO PERF ANTE/RETRO (ADAPTER) ×4 IMPLANT
BAG DECANTER FOR FLEXI CONT (MISCELLANEOUS) ×4 IMPLANT
BANDAGE ELASTIC 4 VELCRO ST LF (GAUZE/BANDAGES/DRESSINGS) ×4 IMPLANT
BANDAGE ELASTIC 6 VELCRO ST LF (GAUZE/BANDAGES/DRESSINGS) ×4 IMPLANT
BASKET HEART  (ORDER IN 25'S) (MISCELLANEOUS) ×1
BASKET HEART (ORDER IN 25'S) (MISCELLANEOUS) ×1
BASKET HEART (ORDER IN 25S) (MISCELLANEOUS) ×2 IMPLANT
BLADE STERNUM SYSTEM 6 (BLADE) ×4 IMPLANT
BLADE SURG 12 STRL SS (BLADE) ×4 IMPLANT
BLADE SURG 15 STRL LF DISP TIS (BLADE) ×2 IMPLANT
BLADE SURG 15 STRL SS (BLADE) ×2
BLADE SURG ROTATE 9660 (MISCELLANEOUS) IMPLANT
BNDG GAUZE ELAST 4 BULKY (GAUZE/BANDAGES/DRESSINGS) ×4 IMPLANT
CANISTER SUCTION 2500CC (MISCELLANEOUS) ×4 IMPLANT
CANN PRFSN 3/8XCNCT ST RT ANG (MISCELLANEOUS) ×2
CANN PRFSN 3/8XRT ANG TPR 14 (MISCELLANEOUS) ×2
CANNULA GUNDRY RCSP 15FR (MISCELLANEOUS) ×4 IMPLANT
CANNULA PRFSN 3/8XCNCT RT ANG (MISCELLANEOUS) ×2 IMPLANT
CANNULA PRFSN 3/8XRT ANG TPR14 (MISCELLANEOUS) ×2 IMPLANT
CANNULA SUMP PERICARDIAL (CANNULA) ×4 IMPLANT
CANNULA VEN MTL TIP RT (MISCELLANEOUS) ×4
CATH CPB KIT VANTRIGT (MISCELLANEOUS) ×4 IMPLANT
CATH ROBINSON RED A/P 18FR (CATHETERS) ×20 IMPLANT
CATH THORACIC 36FR RT ANG (CATHETERS) ×4 IMPLANT
CONT SPEC 4OZ CLIKSEAL STRL BL (MISCELLANEOUS) ×8 IMPLANT
COVER SURGICAL LIGHT HANDLE (MISCELLANEOUS) ×4 IMPLANT
CRADLE DONUT ADULT HEAD (MISCELLANEOUS) ×4 IMPLANT
DRAIN CHANNEL 32F RND 10.7 FF (WOUND CARE) ×4 IMPLANT
DRAPE CARDIOVASCULAR INCISE (DRAPES) ×2
DRAPE SLUSH/WARMER DISC (DRAPES) ×4 IMPLANT
DRAPE SRG 135X102X78XABS (DRAPES) ×2 IMPLANT
DRSG AQUACEL AG ADV 3.5X14 (GAUZE/BANDAGES/DRESSINGS) ×4 IMPLANT
ELECT BLADE 4.0 EZ CLEAN MEGAD (MISCELLANEOUS) ×4
ELECT BLADE 6.5 EXT (BLADE) ×4 IMPLANT
ELECT CAUTERY BLADE 6.4 (BLADE) ×4 IMPLANT
ELECT REM PT RETURN 9FT ADLT (ELECTROSURGICAL) ×8
ELECTRODE BLDE 4.0 EZ CLN MEGD (MISCELLANEOUS) ×2 IMPLANT
ELECTRODE REM PT RTRN 9FT ADLT (ELECTROSURGICAL) ×4 IMPLANT
GAUZE SPONGE 4X4 12PLY STRL (GAUZE/BANDAGES/DRESSINGS) ×4 IMPLANT
GLOVE BIO SURGEON STRL SZ7.5 (GLOVE) ×12 IMPLANT
GOWN STRL REUS W/ TWL LRG LVL3 (GOWN DISPOSABLE) ×8 IMPLANT
GOWN STRL REUS W/TWL LRG LVL3 (GOWN DISPOSABLE) ×8
HEMOSTAT POWDER SURGIFOAM 1G (HEMOSTASIS) ×12 IMPLANT
HEMOSTAT SURGICEL 2X14 (HEMOSTASIS) ×4 IMPLANT
INSERT FOGARTY XLG (MISCELLANEOUS) IMPLANT
KIT BASIN OR (CUSTOM PROCEDURE TRAY) ×4 IMPLANT
KIT ROOM TURNOVER OR (KITS) ×4 IMPLANT
KIT SUCTION CATH 14FR (SUCTIONS) ×4 IMPLANT
KIT VASOVIEW W/TROCAR VH 2000 (KITS) ×4 IMPLANT
LEAD PACING MYOCARDI (MISCELLANEOUS) ×4 IMPLANT
LINE VENT (MISCELLANEOUS) ×4 IMPLANT
LOOP VESSEL SUPERMAXI WHITE (MISCELLANEOUS) ×4 IMPLANT
MARKER GRAFT CORONARY BYPASS (MISCELLANEOUS) ×12 IMPLANT
NS IRRIG 1000ML POUR BTL (IV SOLUTION) ×20 IMPLANT
PACK OPEN HEART (CUSTOM PROCEDURE TRAY) ×4 IMPLANT
PAD ARMBOARD 7.5X6 YLW CONV (MISCELLANEOUS) ×8 IMPLANT
PAD ELECT DEFIB RADIOL ZOLL (MISCELLANEOUS) ×4 IMPLANT
PENCIL BUTTON HOLSTER BLD 10FT (ELECTRODE) ×4 IMPLANT
PUNCH AORTIC ROTATE 4.0MM (MISCELLANEOUS) IMPLANT
PUNCH AORTIC ROTATE 4.5MM 8IN (MISCELLANEOUS) IMPLANT
PUNCH AORTIC ROTATE 5MM 8IN (MISCELLANEOUS) IMPLANT
SET CARDIOPLEGIA MPS 5001102 (MISCELLANEOUS) ×4 IMPLANT
SURGIFLO W/THROMBIN 8M KIT (HEMOSTASIS) ×8 IMPLANT
SUT BONE WAX W31G (SUTURE) ×4 IMPLANT
SUT MNCRL AB 4-0 PS2 18 (SUTURE) IMPLANT
SUT PROLENE 3 0 SH DA (SUTURE) ×4 IMPLANT
SUT PROLENE 3 0 SH1 36 (SUTURE) ×8 IMPLANT
SUT PROLENE 4 0 RB 1 (SUTURE) ×12
SUT PROLENE 4 0 SH DA (SUTURE) ×16 IMPLANT
SUT PROLENE 4-0 RB1 .5 CRCL 36 (SUTURE) ×12 IMPLANT
SUT PROLENE 5 0 C 1 36 (SUTURE) ×28 IMPLANT
SUT PROLENE 6 0 C 1 30 (SUTURE) ×4 IMPLANT
SUT PROLENE 6 0 CC (SUTURE) ×12 IMPLANT
SUT PROLENE 8 0 BV175 6 (SUTURE) IMPLANT
SUT PROLENE BLUE 7 0 (SUTURE) ×4 IMPLANT
SUT SILK  1 MH (SUTURE)
SUT SILK 1 MH (SUTURE) IMPLANT
SUT SILK 2 0 SH CR/8 (SUTURE) ×4 IMPLANT
SUT SILK 3 0 SH CR/8 (SUTURE) IMPLANT
SUT STEEL 6MS V (SUTURE) ×8 IMPLANT
SUT STEEL SZ 6 DBL 3X14 BALL (SUTURE) ×4 IMPLANT
SUT VIC AB 1 CTX 36 (SUTURE) ×4
SUT VIC AB 1 CTX36XBRD ANBCTR (SUTURE) ×4 IMPLANT
SUT VIC AB 2-0 CT1 27 (SUTURE)
SUT VIC AB 2-0 CT1 TAPERPNT 27 (SUTURE) IMPLANT
SUT VIC AB 2-0 CTX 27 (SUTURE) IMPLANT
SUT VIC AB 3-0 X1 27 (SUTURE) IMPLANT
SUTURE E-PAK OPEN HEART (SUTURE) ×4 IMPLANT
SYSTEM SAHARA CHEST DRAIN ATS (WOUND CARE) ×4 IMPLANT
TAPE CLOTH SURG 4X10 WHT LF (GAUZE/BANDAGES/DRESSINGS) ×4 IMPLANT
TOWEL OR 17X24 6PK STRL BLUE (TOWEL DISPOSABLE) ×8 IMPLANT
TOWEL OR 17X26 10 PK STRL BLUE (TOWEL DISPOSABLE) ×8 IMPLANT
TRAY FOLEY IC TEMP SENS 16FR (CATHETERS) ×4 IMPLANT
TUBING INSUFFLATION (TUBING) ×4 IMPLANT
UNDERPAD 30X30 INCONTINENT (UNDERPADS AND DIAPERS) ×4 IMPLANT
WATER STERILE IRR 1000ML POUR (IV SOLUTION) ×8 IMPLANT
YANKAUER SUCT BULB TIP NO VENT (SUCTIONS) ×4 IMPLANT

## 2015-04-01 NOTE — Brief Op Note (Signed)
04/01/2015  11:40 AM  PATIENT:  Valerie Rollins  79 y.o. female  PRE-OPERATIVE DIAGNOSIS:  CAD LEFT ATRIAL MYXOMA  POST-OPERATIVE DIAGNOSIS:  CAD LEFT ATRIAL MYXOMA  PROCEDURE:  Procedure(s):  CORONARY ARTERY BYPASS GRAFTING x 1 -LIMA to LAD  EXCISION OF ATRIAL MYXOMA  -Oversew of Left Atrial Appendage  TRANSESOPHAGEAL ECHOCARDIOGRAM (TEE) (N/A)  SURGEON:  Surgeon(s) and Role:    * Ivin Poot, MD - Primary  PHYSICIAN ASSISTANT: Ellwood Handler PA-C  ANESTHESIA:   none  EBL:  Total I/O In: 1000 [I.V.:1000] Out: 800 [Urine:800]  BLOOD ADMINISTERED: CELLSAVER  DRAINS: Left Pleural Chest Tubes, Mediastinal Chest drains   LOCAL MEDICATIONS USED:  NONE  SPECIMEN:  Source of Specimen:  Atrial Myxoma, Atrial Septum  DISPOSITION OF SPECIMEN:  PATHOLOGY  COUNTS:  YES  TOURNIQUET:  * No tourniquets in log *  DICTATION: .Dragon Dictation  PLAN OF CARE: Admit to inpatient   PATIENT DISPOSITION:  ICU - intubated and hemodynamically stable.   Delay start of Pharmacological VTE agent (>24hrs) due to surgical blood loss or risk of bleeding: yes

## 2015-04-01 NOTE — Anesthesia Preprocedure Evaluation (Addendum)
Anesthesia Evaluation  Patient identified by MRN, date of birth, ID band Patient awake    Reviewed: Allergy & Precautions, NPO status , Patient's Chart, lab work & pertinent test results  Airway Mallampati: I       Dental   Pulmonary    Pulmonary exam normal       Cardiovascular hypertension, + CAD and + Peripheral Vascular Disease + dysrhythmias Atrial Fibrillation Rhythm:Irregular Rate:Tachycardia     Neuro/Psych    GI/Hepatic hiatal hernia, GERD-  ,  Endo/Other    Renal/GU      Musculoskeletal  (+) Arthritis -,   Abdominal   Peds  Hematology   Anesthesia Other Findings   Reproductive/Obstetrics                            Anesthesia Physical Anesthesia Plan  ASA: III  Anesthesia Plan: General   Post-op Pain Management:    Induction: Intravenous  Airway Management Planned: Oral ETT  Additional Equipment: Arterial line, CVP and PA Cath  Intra-op Plan:   Post-operative Plan: Post-operative intubation/ventilation  Informed Consent: I have reviewed the patients History and Physical, chart, labs and discussed the procedure including the risks, benefits and alternatives for the proposed anesthesia with the patient or authorized representative who has indicated his/her understanding and acceptance.     Plan Discussed with: CRNA, Anesthesiologist and Surgeon  Anesthesia Plan Comments:         Anesthesia Quick Evaluation

## 2015-04-01 NOTE — Progress Notes (Signed)
      [address]       [place] [address]             [phone]      Still intubated  Slow to wake up  BP 93/67 mmHg  Pulse 90  Temp(Src) 97.5 F (36.4 C) (Core (Comment))  Resp 13  Ht 5' 5.5" (1.664 m)  Wt 178 lb (80.74 kg)  BMI 29.16 kg/m2  SpO2 100%  PA 18/12 CO= 4.03   Intake/Output Summary (Last 24 hours) at 04/01/15 1837 Last data filed at 04/01/15 1800  Gross per 24 hour  Intake 5680.93 ml  Output   7690 ml  Net -2009.07 ml   Minimal CT output  Doing well early postop- wean vent as tolerated  Remo Lipps C. Roxan Hockey, MD [facility] [phone]

## 2015-04-01 NOTE — Anesthesia Procedure Notes (Addendum)
Procedure Name: Intubation Date/Time: 04/01/2015 7:49 AM Performed by: Jacquiline Doe A Pre-anesthesia Checklist: Patient identified, Timeout performed, Emergency Drugs available, Suction available and Patient being monitored Patient Re-evaluated:Patient Re-evaluated prior to inductionOxygen Delivery Method: Circle system utilized Preoxygenation: Pre-oxygenation with 100% oxygen Intubation Type: IV induction and Cricoid Pressure applied Ventilation: Mask ventilation without difficulty Laryngoscope Size: Mac and 4 Grade View: Grade I Tube type: Oral Tube size: 8.0 mm Number of attempts: 1 Airway Equipment and Method: Stylet Placement Confirmation: ETT inserted through vocal cords under direct vision,  breath sounds checked- equal and bilateral and positive ETCO2 Secured at: 21 cm Tube secured with: Tape Dental Injury: Teeth and Oropharynx as per pre-operative assessment

## 2015-04-01 NOTE — Anesthesia Postprocedure Evaluation (Signed)
  Anesthesia Post-op Note  Patient: Valerie Rollins  Procedure(s) Performed: Procedure(s): CORONARY ARTERY BYPASS GRAFTING (CABG), ON PUMP, TIMES ONE, USING LEFT INTERNAL MAMMARY ARTERY (N/A) EXCISION OF ATRIAL MYXOMA (N/A) TRANSESOPHAGEAL ECHOCARDIOGRAM (TEE) (N/A)  Patient Location: SICU  Anesthesia Type:General  Level of Consciousness: sedated and Patient remains intubated per anesthesia plan  Airway and Oxygen Therapy: Patient remains intubated per anesthesia plan and Patient placed on Ventilator (see vital sign flow sheet for setting)  Post-op Pain: none  Post-op Assessment: Post-op Vital signs reviewed, Patient's Cardiovascular Status Stable, Respiratory Function Stable, Patent Airway, No signs of Nausea or vomiting and Pain level controlled  Post-op Vital Signs: stable  Last Vitals:  Filed Vitals:   04/01/15 0545  BP: 173/69  Pulse: 59  Temp: 36.7 C  Resp: 18    Complications: No apparent anesthesia complications

## 2015-04-01 NOTE — Progress Notes (Signed)
UR completed.  A CM will be following patient through hospitalization to determine any d/c needs.   Sandi Mariscal, RN BSN [facility] Trauma/Neuro ICU Case Manager [phone]

## 2015-04-01 NOTE — Progress Notes (Signed)
The patient was examined and preop studies reviewed. There has been no change from the prior exam and the patient is ready for surgery. Plan CABG and removal of Myxoma on [employer]

## 2015-04-01 NOTE — Procedures (Signed)
Extubation Procedure Note  Patient Details:   Name: Valerie Rollins DOB: 05-Nov-1936 MRN: [id number]   Airway Documentation:     Evaluation  O2 sats: stable throughout Complications: No apparent complications Patient did tolerate procedure well. Bilateral Breath Sounds: Clear, Diminished Suctioning: Oral, Airway Yes   Patient performed NIF -20, VC 560 mL and IS 500 mL.  Patient had a positive cuff leak and was extubated to a 4 L [place].   Carrington Clamp A 04/01/2015, 10:58 PM

## 2015-04-01 NOTE — Transfer of Care (Signed)
Immediate Anesthesia Transfer of Care Note  Patient: Valerie Rollins  Procedure(s) Performed: Procedure(s): CORONARY ARTERY BYPASS GRAFTING (CABG), ON PUMP, TIMES ONE, USING LEFT INTERNAL MAMMARY ARTERY (N/A) EXCISION OF ATRIAL MYXOMA (N/A) TRANSESOPHAGEAL ECHOCARDIOGRAM (TEE) (N/A)  Patient Location: SICU  Anesthesia Type:General  Level of Consciousness: sedated and Patient remains intubated per anesthesia plan  Airway & Oxygen Therapy: Patient remains intubated per anesthesia plan and Patient placed on Ventilator (see vital sign flow sheet for setting)  Post-op Assessment: Report given to RN and Post -op Vital signs reviewed and stable  Post vital signs: Reviewed and stable  Last Vitals:  Filed Vitals:   04/01/15 1404  BP: 101/52  Pulse:   Temp:   Resp: 12    Complications: No apparent anesthesia complications

## 2015-04-01 NOTE — Progress Notes (Signed)
Pt switched back to full support for pain meds and to awake a little more.  Pt had low VT on cpap made.  Rt will continue to monitor.

## 2015-04-01 NOTE — Progress Notes (Signed)
Echocardiogram Echocardiogram Transesophageal has been performed.  Joelene Millin 04/01/2015, 8:39 AM

## 2015-04-01 NOTE — OR Nursing (Signed)
Second call to SICU at 1323.

## 2015-04-01 NOTE — OR Nursing (Signed)
First call to SICU at 1220.

## 2015-04-02 ENCOUNTER — Inpatient Hospital Stay (HOSPITAL_COMMUNITY): Payer: PPO

## 2015-04-02 ENCOUNTER — Encounter (HOSPITAL_COMMUNITY): Payer: Self-pay | Admitting: Cardiothoracic Surgery

## 2015-04-02 ENCOUNTER — Telehealth: Payer: Self-pay | Admitting: Internal Medicine

## 2015-04-02 ENCOUNTER — Telehealth (HOSPITAL_COMMUNITY): Payer: Self-pay | Admitting: *Deleted

## 2015-04-02 LAB — POCT I-STAT, CHEM 8
BUN: 9 mg/dL (ref 6–20)
Calcium, Ion: 1.2 mmol/L (ref 1.13–1.30)
Chloride: 97 mmol/L — ABNORMAL LOW (ref 101–111)
Creatinine, Ser: 0.8 mg/dL (ref 0.44–1.00)
Glucose, Bld: 141 mg/dL — ABNORMAL HIGH (ref 70–99)
HCT: 29 % — ABNORMAL LOW (ref 36.0–46.0)
Hemoglobin: 9.9 g/dL — ABNORMAL LOW (ref 12.0–15.0)
Potassium: 3.8 mmol/L (ref 3.5–5.1)
Sodium: 134 mmol/L — ABNORMAL LOW (ref 135–145)
TCO2: 20 mmol/L (ref 0–100)

## 2015-04-02 LAB — POCT I-STAT 3, ART BLOOD GAS (G3+)
Acid-base deficit: 4 mmol/L — ABNORMAL HIGH (ref 0.0–2.0)
Acid-base deficit: 4 mmol/L — ABNORMAL HIGH (ref 0.0–2.0)
Bicarbonate: 20.8 mEq/L (ref 20.0–24.0)
Bicarbonate: 22.4 mEq/L (ref 20.0–24.0)
O2 Saturation: 94 %
O2 Saturation: 95 %
Patient temperature: 36.9
Patient temperature: 36.9
TCO2: 22 mmol/L (ref 0–100)
TCO2: 24 mmol/L (ref 0–100)
pCO2 arterial: 37.8 mmHg (ref 35.0–45.0)
pCO2 arterial: 44.8 mmHg (ref 35.0–45.0)
pH, Arterial: 7.307 — ABNORMAL LOW (ref 7.350–7.450)
pH, Arterial: 7.347 — ABNORMAL LOW (ref 7.350–7.450)
pO2, Arterial: 79 mmHg — ABNORMAL LOW (ref 80.0–100.0)
pO2, Arterial: 79 mmHg — ABNORMAL LOW (ref 80.0–100.0)

## 2015-04-02 LAB — BASIC METABOLIC PANEL
Anion gap: 8 (ref 5–15)
BUN: 7 mg/dL (ref 6–20)
CO2: 23 mmol/L (ref 22–32)
CREATININE: 0.72 mg/dL (ref 0.44–1.00)
Calcium: 8.1 mg/dL — ABNORMAL LOW (ref 8.9–10.3)
Chloride: 106 mmol/L (ref 101–111)
GFR calc non Af Amer: >60 mL/min (ref >60)
Glucose, Bld: 136 mg/dL — ABNORMAL HIGH (ref 70–99)
Potassium: 3.5 mmol/L (ref 3.5–5.1)
Sodium: 137 mmol/L (ref 135–145)

## 2015-04-02 LAB — GLUCOSE, CAPILLARY
Glucose-Capillary: 100 mg/dL — ABNORMAL HIGH (ref 70–99)
Glucose-Capillary: 102 mg/dL — ABNORMAL HIGH (ref 70–99)
Glucose-Capillary: 115 mg/dL — ABNORMAL HIGH (ref 70–99)
Glucose-Capillary: 115 mg/dL — ABNORMAL HIGH (ref 70–99)
Glucose-Capillary: 127 mg/dL — ABNORMAL HIGH (ref 70–99)
Glucose-Capillary: 137 mg/dL — ABNORMAL HIGH (ref 70–99)
Glucose-Capillary: 137 mg/dL — ABNORMAL HIGH (ref 70–99)
Glucose-Capillary: 86 mg/dL (ref 70–99)

## 2015-04-02 LAB — CBC
HCT: 30.8 % — ABNORMAL LOW (ref 36.0–46.0)
HCT: 29.9 % — ABNORMAL LOW (ref 36.0–46.0)
Hemoglobin: 10.4 g/dL — ABNORMAL LOW (ref 12.0–15.0)
Hemoglobin: 10 g/dL — ABNORMAL LOW (ref 12.0–15.0)
MCH: 28.4 pg (ref 26.0–34.0)
MCH: 28.1 pg (ref 26.0–34.0)
MCHC: 33.8 g/dL (ref 30.0–36.0)
MCHC: 33.4 g/dL (ref 30.0–36.0)
MCV: 84.2 fL (ref 78.0–100.0)
MCV: 84 fL (ref 78.0–100.0)
PLATELETS: 143 10*3/uL — AB (ref 150–400)
PLATELETS: 144 10*3/uL — AB (ref 150–400)
RBC: 3.66 MIL/uL — ABNORMAL LOW (ref 3.87–5.11)
RBC: 3.56 MIL/uL — ABNORMAL LOW (ref 3.87–5.11)
RDW: 13.3 % (ref 11.5–15.5)
RDW: 13.3 % (ref 11.5–15.5)
WBC: 8.9 10*3/uL (ref 4.0–10.5)
WBC: 10.6 10*3/uL — AB (ref 4.0–10.5)

## 2015-04-02 LAB — CREATININE, SERUM
CREATININE: 0.89 mg/dL (ref 0.44–1.00)
GFR calc Af Amer: >60 mL/min (ref >60)

## 2015-04-02 LAB — MAGNESIUM
Magnesium: 2.2 mg/dL (ref 1.7–2.4)
Magnesium: 2 mg/dL (ref 1.7–2.4)

## 2015-04-02 MED ORDER — INSULIN ASPART 100 UNIT/ML ~~LOC~~ SOLN: 0.0000 [IU] | SUBCUTANEOUS | Status: DC

## 2015-04-02 MED ORDER — ENOXAPARIN SODIUM 30 MG/0.3ML ~~LOC~~ SOLN
30.0000 mg | SUBCUTANEOUS | Status: DC
  Administered 2015-04-03 – 2015-04-05 (×3): 30 mg via SUBCUTANEOUS
  Filled 2015-04-02 (×4): qty 0.3

## 2015-04-02 MED ORDER — INSULIN ASPART 100 UNIT/ML ~~LOC~~ SOLN
0.0000 [IU] | SUBCUTANEOUS | Status: DC
  Administered 2015-04-02: 2 [IU] via SUBCUTANEOUS

## 2015-04-02 MED ORDER — POTASSIUM CHLORIDE 10 MEQ/50ML IV SOLN
10.0000 meq | INTRAVENOUS | Status: AC
  Administered 2015-04-02 (×3): 10 meq via INTRAVENOUS
  Filled 2015-04-02: qty 50

## 2015-04-02 MED ORDER — LEVALBUTEROL HCL 0.63 MG/3ML IN NEBU
INHALATION_SOLUTION | RESPIRATORY_TRACT | Status: AC
  Filled 2015-04-02: qty 3

## 2015-04-02 MED ORDER — FUROSEMIDE 10 MG/ML IJ SOLN
20.0000 mg | Freq: Once | INTRAMUSCULAR | Status: AC
  Administered 2015-04-02: 20 mg via INTRAVENOUS

## 2015-04-02 MED ORDER — INSULIN ASPART 100 UNIT/ML ~~LOC~~ SOLN
0.0000 [IU] | SUBCUTANEOUS | Status: DC
  Administered 2015-04-02 – 2015-04-03 (×3): 2 [IU] via SUBCUTANEOUS

## 2015-04-02 MED ORDER — OXYCODONE HCL 5 MG PO TABS: 5.0000 mg | ORAL_TABLET | Freq: Four times a day (QID) | ORAL | Status: DC | PRN

## 2015-04-02 NOTE — Telephone Encounter (Signed)
Tylenol OK as long as recommended daily dose not exceeded. Issue was with Darvocet, no longer on market

## 2015-04-02 NOTE — Telephone Encounter (Signed)
She just got out of open heart surgery less than 24 hrs ago and the doctors wanted to give her tylenol and she was coherent enough to remember that Dr. Linna Darner told her at one time not to take tylenol but they don't know why. Can you call Valerie Rollins back and let her know.

## 2015-04-02 NOTE — Progress Notes (Signed)
1 Day Post-Op Procedure(s) (LRB): CORONARY ARTERY BYPASS GRAFTING (CABG), ON PUMP, TIMES ONE, USING LEFT INTERNAL MAMMARY ARTERY (N/A) EXCISION OF ATRIAL MYXOMA (N/A) TRANSESOPHAGEAL ECHOCARDIOGRAM (TEE) (N/A) Subjective: Extubated A-pacing Neuro intact  Objective: Vital signs in last 24 hours: Temp:  [96.1 F (35.6 C)-98.8 F (37.1 C)] 98.1 F (36.7 C) (05/11 0800) Pulse Rate:  [76-118] 90 (05/11 0800) Cardiac Rhythm:  [-] Atrial paced (05/11 0645) Resp:  [12-29] 18 (05/11 0800) BP: (81-121)/(36-83) 108/72 mmHg (05/11 0800) SpO2:  [97 %-100 %] 99 % (05/11 0800) Arterial Line BP: (90-141)/(44-74) 135/60 mmHg (05/11 0800) FiO2 (%):  [40 %-50 %] 40 % (05/10 2225) Weight:  [185 lb 11.2 oz (84.233 kg)] 185 lb 11.2 oz (84.233 kg) (05/11 0556)  Hemodynamic parameters for last 24 hours: PAP: (15-35)/(7-23) 20/15 mmHg CO:  [2.5 L/min-5.2 L/min] 4.2 L/min CI:  [1.3 L/min/m2-2.8 L/min/m2] 2.3 L/min/m2  Intake/Output from previous day: 05/10 0701 - 05/11 0700 In: 7451.2 [I.V.:4221.2; Blood:830; IV Piggyback:2400] Out: 9510 [Urine:7345; Blood:1785; Chest Tube:380] Intake/Output this shift: Total I/O In: 40 [I.V.:40] Out: -   Lungs clear extrem warm  Lab Results:  Recent Labs  04/01/15 2030 04/02/15 0405  WBC 9.6 8.9  HGB 11.3* 10.4*  HCT 33.3* 30.8*  PLT 153 143*   BMET:  Recent Labs  03/31/15 1333  04/01/15 2027 04/01/15 2030 04/02/15 0405  NA 138  < > 141  --  137  K 4.1  < > 4.0  --  3.5  CL 105  < > 105  --  106  CO2 23  --   --   --  23  GLUCOSE 100*  < > 133*  --  136*  BUN 19  < > 9  --  7  CREATININE 0.85  < > 0.60 0.64 0.72  CALCIUM 9.5  --   --   --  8.1*  < > = values in this interval not displayed.  PT/INR:  Recent Labs  04/01/15 1410  LABPROT 18.0*  INR 1.47   ABG    Component Value Date/Time   PHART 7.347* 04/02/2015 0006   HCO3 20.8 04/02/2015 0006   TCO2 22 04/02/2015 0006   ACIDBASEDEF 4.0* 04/02/2015 0006   O2SAT 95.0 04/02/2015  0006   CBG (last 3)   Recent Labs  04/01/15 2201 04/01/15 2259 04/02/15 0004  GLUCAP 115* 86 100*    Assessment/Plan: S/P Procedure(s) (LRB): CORONARY ARTERY BYPASS GRAFTING (CABG), ON PUMP, TIMES ONE, USING LEFT INTERNAL MAMMARY ARTERY (N/A) EXCISION OF ATRIAL MYXOMA (N/A) TRANSESOPHAGEAL ECHOCARDIOGRAM (TEE) (N/A) Mobilize Diuresis Diabetes control d/c tubes/lines See progression orders   LOS: 1 day    Tharon Aquas Trigt III 04/02/2015

## 2015-04-02 NOTE — Op Note (Signed)
Valerie Rollins, BERKLAND NO.:  [id number]  MEDICAL RECORD NO.:  [id number]  LOCATION:  2S06C                        FACILITY:  [facility]  PHYSICIAN:  Ivin Poot, M.D.  DATE OF BIRTH:  06/26/1936  DATE OF PROCEDURE:  04/01/2015 DATE OF DISCHARGE:                              OPERATIVE REPORT   OPERATION: 1. Resection of left atrial myxoma and resection of a portion of the     interatrial septum. 2. Ligation of left atrial appendage.  SURGEON:  Ivin Poot, M.D.  ASSISTANT:  Ellwood Handler, PA-C  PREOPERATIVE DIAGNOSIS:  Large left atrial myxoma and history of atrial fibrillation.  POSTOPERATIVE DIAGNOSIS:  Large left atrial myxoma and history of atrial fibrillation.  ANESTHESIA:  General by Dr. Kate Sable.  INDICATIONS:  The patient is a 79 year old Caucasian female with symptoms of upper respiratory infection, who had a chest x-ray.  Her physician noted her heart to be enlarged and an echocardiogram was performed.  This showed a large over 4 cm left atrial myxoma with mild mitral regurgitation and good LV function.  She subsequently underwent left heart cath, which showed 80% proximal LAD stenosis with good LV function.  She was felt to be candidate for resection of the left atrial myxoma with combined CABG x1.  I examined the patient in the office in consultation and reviewed results of her echocardiogram and coronary angiogram.  I discussed the indications and expected benefits of combined CABG with resection of left atrial myxoma.  The patient understood the major details of surgery including the use of general anesthesia and cardiopulmonary bypass, the location of the surgical incisions, the expected postoperative recovery, and the potential risks including the risks of MI, stroke, bleeding, blood transfusion requirement, postoperative pacemaker requirement, postoperative pulmonary problems including pleural effusions, infection, and death.   After reviewing these issues, she demonstrated her understanding and agreed to proceed with surgery under what I felt was an informed consent.  OPERATIVE FINDINGS: 1. Large left atrial myxoma filling much of left atrial chamber, but     far from the mitral valve. 2. Successful resection of the myxoma with a portion of the     interatrial septum achieved by a biatrial approach. 3. Successful over-sewing of left atrial appendage. 4. No blood products required for this operation.  OPERATIVE PROCEDURE:  The patient was brought to the operating room and placed supine on the table, where general anesthesia was induced under invasive hemodynamic monitoring.  The transesophageal echo probe was placed by the anesthesiologist.  A proper time-out was performed and the patient was prepped and draped using sterile field.  A sternal incision was made.  The left internal mammary artery was harvested as a pedicle graft from its origin at the subclavian vessels.  It was 1.5-mm vessel with good flow.  The sternal retractor was placed and the pericardium was opened and suspended.  The ascending aorta was minimally enlarged. Pursestrings were placed in the ascending aorta and the superior vena cava, and heparin was administered.  When the ACT was documented as being therapeutic, the patient was cannulated through the pursestring in the aorta and SVC and placed on cardiopulmonary  bypass.  A second pursestring was placed low on the right atrium and a second cannula was placed for bicaval drainage.  Caval loops were placed around the SVC and IVC cannula.  The interatrial groove was dissected out.  The LAD was dissected for grafting.  Cardioplegia cannulas were placed for both antegrade aortic and retrograde coronary sinus catheters.  The patient was cooled to 32 degrees.  The aortic crossclamp was applied.  One liter of cold blood cardioplegia was delivered in split doses between the antegrade aortic and  retrograde coronary sinus catheters.  There was good cardioplegic arrest and septal temperature dropped less than 14 degrees. Cardioplegia was delivered every 20 minutes or less while the crossclamp was in place.  The distal coronary anastomosis was performed.  The LAD was open.  It was 1.5-mm vessel with proximal 80% stenosis.  The left IMA pedicle was brought through an opening in the left lateral pericardium, it was brought down onto the LAD and sewn end-to-side with running 8-0 Prolene. There was good flow through the anastomosis after briefly releasing the pedicle bulldog in the mammary artery.  The bulldog was reapplied and the pedicle was secured at the epicardium.  Cardioplegia was redosed.  Attention was then directed to the interatrial groove.  A left atriotomy incision was performed.  The atrial retractors were positioned.  There was a large myxoma medially noted arising from the roof of the left atrium from the interatrial septum.  It was very large compared to the volume of the left atrium, and it was carefully dissected and a subendothelial plane was developed to remove the tumor out of left atrial incision.  It was intact.  The atrium was irrigated.  After removing the myxoma, the origin of atrial appendage was identified and this was oversewn with running 3-0 Prolene in 2 layers.  The mitral valve was tested with saline and found to be competent and structurally intact.  The incision was then made in the right atrium after the caval tapes were tightened.  The fossa ovalis was identified.  There was no evidence of tumor on the right atrial side, but a margin of the atrial septum was excised and centered on the fossa ovalis in order to remove any subendothelial component of the myxoma on the left atrial size.  This left a large defect in the atrial septum.  This was closed using a pericardial patch, which was then treated with glutaraldehyde and rinsed in saline for 30  minutes in different containers.  The atrial septal patch was sewn with a running 5-0 Prolene.  The right atriotomy was closed with running 4-0 Prolene.  The left atriotomy was closed in 2 layers using a running 3-0 Prolene.  At this point, the bulldog on the mammary artery was released and a dose of retrograde warm blood cardioplegia was delivered to remove any air from the coronaries and left side of the heart.  The crossclamp was then removed with the patient in a steep Trendelenburg position.  The heart resumed a spontaneous rhythm.  The retrograde coronary sinus cardioplegia cannula was removed.  The aortic vent was left on.  Distal coronary anastomosis was checked and found to be hemostatic.  The atriotomy  incisions were checked and found to be hemostatic.  Temporary pacing wires were applied.  When the patient was rewarmed and reperfused, lungs were re-expanded.  The lower IVC cannula was removed. The patient was weaned off cardiopulmonary bypass without inotropes. Cardiac output was stable.  Echo  showed good LV function.  There was no evidence of ASD.  The venous cannula was removed.  Protamine was administered without adverse reaction.  The mediastinum was irrigated. The aortic cannula was removed after the patient had been stabilized for several minutes after protamine.  The superior pericardial fat was closed over the aorta.  An anterior mediastinal left pleural chest tube was placed and brought through separate incisions.  The sternum was closed with interrupted wire.  The pectoralis fascia was closed in running #1 Vicryl.  The subcutaneous and skin layers were closed in running Vicryl.  Sterile dressings were applied.  Total cardiopulmonary bypass time was 134 minutes.     Ivin Poot, M.D.     PV/MEDQ  D:  04/01/2015  T:  04/02/2015  Job:  [id number]  cc:   Darrick Penna. Linna Darner, MD,FACP,FCCP [place] [facility] Breed, MD, [facility]

## 2015-04-02 NOTE — Progress Notes (Signed)
TCTS DAILY ICU PROGRESS NOTE                   [address]            [place] [address]          [phone]   1 Day Post-Op Procedure(s) (LRB): CORONARY ARTERY BYPASS GRAFTING (CABG), ON PUMP, TIMES ONE, USING LEFT INTERNAL MAMMARY ARTERY (N/A) EXCISION OF ATRIAL MYXOMA (N/A) TRANSESOPHAGEAL ECHOCARDIOGRAM (TEE) (N/A)  Total Length of Stay:  LOS: 1 day   Subjective: Awake and alert.  No complaints except drowsy.    Objective: Vital signs in last 24 hours: Temp:  [96.1 F (35.6 C)-98.8 F (37.1 C)] 98.1 F (36.7 C) (05/11 0700) Pulse Rate:  [76-118] 90 (05/11 0700) Cardiac Rhythm:  [-] Atrial paced (05/11 0645) Resp:  [12-29] 16 (05/11 0700) BP: (81-121)/(36-83) 97/69 mmHg (05/11 0700) SpO2:  [97 %-100 %] 99 % (05/11 0700) Arterial Line BP: (90-141)/(44-74) 126/57 mmHg (05/11 0700) FiO2 (%):  [40 %-50 %] 40 % (05/10 2225) Weight:  [185 lb 11.2 oz (84.233 kg)] 185 lb 11.2 oz (84.233 kg) (05/11 0556)  Filed Weights   04/01/15 0545 04/01/15 0552 04/02/15 0556  Weight: 178 lb (80.74 kg) 178 lb (80.74 kg) 185 lb 11.2 oz (84.233 kg)    Weight change: 7 lb 11.2 oz (3.493 kg)   Hemodynamic parameters for last 24 hours: PAP: (15-35)/(7-23) 21/12 mmHg CO:  [2.5 L/min-5.2 L/min] 5.2 L/min CI:  [1.3 L/min/m2-2.8 L/min/m2] 2.8 L/min/m2  Intake/Output from previous day: 05/10 0701 - 05/11 0700 In: 7411.2 [I.V.:4181.2; Blood:830; IV Piggyback:2400] Out: 9510 [Urine:7345; Blood:1785; Chest Tube:380]  CBGs 109-120-139-124-137-86-100-136    Current Meds: Scheduled Meds: . acetaminophen  1,000 mg Oral 4 times per day   Or  . acetaminophen (TYLENOL) oral liquid 160 mg/5 mL  1,000 mg Per Tube 4 times per day  . antiseptic oral rinse  7 mL Mouth Rinse QID  . aspirin EC  325 mg Oral Daily   Or  . aspirin  324 mg Per Tube Daily  . bisacodyl  10 mg Oral Daily   Or  . bisacodyl  10 mg Rectal Daily  . cefUROXime (ZINACEF)  IV  1.5 g Intravenous Q12H  .  chlorhexidine  15 mL Mouth Rinse BID  . cholecalciferol  1,000 Units Oral Daily  . docusate sodium  200 mg Oral Daily  . insulin aspart  0-24 Units Subcutaneous 6 times per day  . ipratropium-albuterol  3 mL Inhalation Q6H  . latanoprost  1 drop Both Eyes Daily  . loratadine  10 mg Oral Daily  . metoprolol tartrate  12.5 mg Oral BID   Or  . metoprolol tartrate  12.5 mg Per Tube BID  . montelukast  10 mg Oral QHS  . multivitamin with minerals  1 tablet Oral Daily  . [START ON 04/03/2015] pantoprazole  40 mg Oral Daily  . potassium chloride  10 mEq Intravenous Q1 Hr x 3  . sodium chloride  3 mL Intravenous Q12H   Continuous Infusions: . sodium chloride 20 mL/hr at 04/01/15 1900  . sodium chloride    . sodium chloride 20 mL/hr at 04/01/15 1900  . amiodarone 30 mg/hr (04/02/15 0125)  . dexmedetomidine Stopped (04/01/15 1600)  . DOPamine 3 mcg/kg/min (04/01/15 1900)  . lactated ringers 20 mL/hr at 04/01/15 1900  . lactated ringers    . nitroGLYCERIN Stopped (04/01/15 1400)  . phenylephrine (NEO-SYNEPHRINE) Adult infusion Stopped (04/02/15 0645)  PRN Meds:.sodium chloride, metoprolol, midazolam, morphine injection, ondansetron (ZOFRAN) IV, oxyCODONE, sodium chloride, traMADol   Physical Exam: General appearance: alert, cooperative and no distress Heart: atrial paced at 90 Lungs: diminished breath sounds bibasilar Abdomen: soft, non-tender; bowel sounds normal; no masses,  no organomegaly Extremities: Mild LE edema Wound: Dressed and dry  Lab Results: CBC: Recent Labs  04/01/15 2030 04/02/15 0405  WBC 9.6 8.9  HGB 11.3* 10.4*  HCT 33.3* 30.8*  PLT 153 143*   BMET:  Recent Labs  03/31/15 1333  04/01/15 2027 04/01/15 2030 04/02/15 0405  NA 138  < > 141  --  137  K 4.1  < > 4.0  --  3.5  CL 105  < > 105  --  106  CO2 23  --   --   --  23  GLUCOSE 100*  < > 133*  --  136*  BUN 19  < > 9  --  7  CREATININE 0.85  < > 0.60 0.64 0.72  CALCIUM 9.5  --   --   --  8.1*    < > = values in this interval not displayed.  PT/INR:  Recent Labs  04/01/15 1410  LABPROT 18.0*  INR 1.47   Radiology: Dg Chest Port 1 View  04/01/2015   CLINICAL DATA:  One day status post coronary artery bypass grafting. Hypoxia.  EXAM: PORTABLE CHEST - 1 VIEW  COMPARISON:  Study obtained earlier in the day  FINDINGS: Endotracheal tube tip is 3.7 cm above the carina. Swan-Ganz catheter tip is now in the proximal most aspect of the right interlobar pulmonary artery. There is a left chest tube and a mediastinal drain. Nasogastric tube tip and side port are in the stomach. No pneumothorax. There is mild bibasilar atelectasis. Lungs are otherwise clear. Heart is upper normal in size with pulmonary vascularity within normal limits.  IMPRESSION: Tube and catheter positions as described without pneumothorax. Mild bibasilar atelectatic change.   Electronically Signed   By: Lowella Grip III M.D.   On: 04/01/2015 14:18   Dg Chest Portable 1 View  04/01/2015   CLINICAL DATA:  Incorrect surgical count.  EXAM: PORTABLE CHEST - 1 VIEW  COMPARISON:  Mar 31, 2015.  FINDINGS: Stable cardiomediastinal silhouette. Left-sided chest tube is noted without pneumothorax. Right internal jugular Swan-Ganz catheter is noted with distal tip overlying expected position of main pulmonary artery. Endotracheal tube is in grossly good position with distal tip 3.4 cm above the carina. Probable in this Cope is seen projected over the spine. No other radiopaque foreign body is noted.  IMPRESSION: Left-sided chest tube is noted without pneumothorax. Endotracheal tube in grossly good position.   Electronically Signed   By: Marijo Conception, M.D.   On: 04/01/2015 13:12     Assessment/Plan: S/P Procedure(s) (LRB): CORONARY ARTERY BYPASS GRAFTING (CABG), ON PUMP, TIMES ONE, USING LEFT INTERNAL MAMMARY ARTERY (N/A) EXCISION OF ATRIAL MYXOMA (N/A) TRANSESOPHAGEAL ECHOCARDIOGRAM (TEE) (N/A)  CV- atrial paced at 90, BPs low  overnight. Gtts:Dopamine, Amio, Neo.  Wean gtts as tolerated today and leave paced for now.  Pulm- IS/pulm toilet.  Expected postop blood loss anemia- H/H generally stable.  Hypokalemia- replace K+.  Mobilize, d/c lines and tubes as able, routine POD#1 progression.  Lynn Recendiz H 04/02/2015 7:58 AM

## 2015-04-02 NOTE — Progress Notes (Signed)
Patient ID: Valerie Rollins, female   DOB: October 04, 1936, 79 y.o.   MRN: [id number]  SICU Evening Rounds:  Hemodynamically stable  Urine output good  CBC    Component Value Date/Time   WBC 10.6* 04/02/2015 1649   RBC 3.56* 04/02/2015 1649   HGB 9.9* 04/02/2015 1651   HCT 29.0* 04/02/2015 1651   PLT 144* 04/02/2015 1649   MCV 84.0 04/02/2015 1649   MCH 28.1 04/02/2015 1649   MCHC 33.4 04/02/2015 1649   RDW 13.3 04/02/2015 1649   LYMPHSABS 1.8 02/26/2015 1123   MONOABS 0.6 02/26/2015 1123   EOSABS 0.1 02/26/2015 1123   BASOSABS 0.0 02/26/2015 1123    BMET    Component Value Date/Time   NA 134* 04/02/2015 1651   K 3.8 04/02/2015 1651   CL 97* 04/02/2015 1651   CO2 23 04/02/2015 0405   GLUCOSE 141* 04/02/2015 1651   BUN 9 04/02/2015 1651   CREATININE 0.80 04/02/2015 1651   CREATININE 0.82 03/25/2015 1640   CALCIUM 8.1* 04/02/2015 0405   GFRNONAA >60 04/02/2015 1649   GFRNONAA 70 03/25/2015 1640   GFRAA >60 04/02/2015 1649   GFRAA 80 03/25/2015 1640    A/P: stable postop course

## 2015-04-02 NOTE — Telephone Encounter (Signed)
Beth Allred has been notified.

## 2015-04-03 ENCOUNTER — Inpatient Hospital Stay (HOSPITAL_COMMUNITY): Payer: PPO

## 2015-04-03 LAB — BASIC METABOLIC PANEL
Anion gap: 9 (ref 5–15)
Anion gap: 9 (ref 5–15)
BUN: 10 mg/dL (ref 6–20)
BUN: 11 mg/dL (ref 6–20)
CO2: 25 mmol/L (ref 22–32)
CO2: 26 mmol/L (ref 22–32)
Calcium: 8.4 mg/dL — ABNORMAL LOW (ref 8.9–10.3)
Calcium: 8.4 mg/dL — ABNORMAL LOW (ref 8.9–10.3)
Chloride: 100 mmol/L — ABNORMAL LOW (ref 101–111)
Chloride: 99 mmol/L — ABNORMAL LOW (ref 101–111)
Creatinine, Ser: 0.91 mg/dL (ref 0.44–1.00)
Creatinine, Ser: 1.03 mg/dL — ABNORMAL HIGH (ref 0.44–1.00)
GFR calc Af Amer: >60 mL/min (ref >60)
GFR calc Af Amer: 60 mL/min — ABNORMAL LOW (ref >60)
GFR calc non Af Amer: 60 mL/min — ABNORMAL LOW (ref >60)
GFR calc non Af Amer: 51 mL/min — ABNORMAL LOW (ref >60)
Glucose, Bld: 115 mg/dL — ABNORMAL HIGH (ref 65–99)
Glucose, Bld: 214 mg/dL — ABNORMAL HIGH (ref 65–99)
Potassium: 3.5 mmol/L (ref 3.5–5.1)
Potassium: 5.8 mmol/L — ABNORMAL HIGH (ref 3.5–5.1)
Sodium: 134 mmol/L — ABNORMAL LOW (ref 135–145)
Sodium: 134 mmol/L — ABNORMAL LOW (ref 135–145)

## 2015-04-03 LAB — CBC
HCT: 29.7 % — ABNORMAL LOW (ref 36.0–46.0)
Hemoglobin: 10 g/dL — ABNORMAL LOW (ref 12.0–15.0)
MCH: 28.2 pg (ref 26.0–34.0)
MCHC: 33.7 g/dL (ref 30.0–36.0)
MCV: 83.7 fL (ref 78.0–100.0)
Platelets: 153 10*3/uL (ref 150–400)
RBC: 3.55 MIL/uL — ABNORMAL LOW (ref 3.87–5.11)
RDW: 13.4 % (ref 11.5–15.5)
WBC: 9.8 10*3/uL (ref 4.0–10.5)

## 2015-04-03 LAB — GLUCOSE, CAPILLARY
Glucose-Capillary: 144 mg/dL — ABNORMAL HIGH (ref 65–99)
Glucose-Capillary: 112 mg/dL — ABNORMAL HIGH (ref 65–99)
Glucose-Capillary: 124 mg/dL — ABNORMAL HIGH (ref 65–99)
Glucose-Capillary: 98 mg/dL (ref 65–99)
Glucose-Capillary: 112 mg/dL — ABNORMAL HIGH (ref 65–99)
Glucose-Capillary: 125 mg/dL — ABNORMAL HIGH (ref 65–99)

## 2015-04-03 MED ORDER — SODIUM CHLORIDE 0.9 % IJ SOLN: 3.0000 mL | INTRAMUSCULAR | Status: DC | PRN

## 2015-04-03 MED ORDER — INSULIN ASPART 100 UNIT/ML ~~LOC~~ SOLN
0.0000 [IU] | Freq: Three times a day (TID) | SUBCUTANEOUS | Status: DC
  Administered 2015-04-03: 2 [IU] via SUBCUTANEOUS

## 2015-04-03 MED ORDER — DIGOXIN 0.25 MG/ML IJ SOLN
0.2500 mg | Freq: Once | INTRAMUSCULAR | Status: AC
  Administered 2015-04-03: 0.25 mg via INTRAVENOUS
  Filled 2015-04-03: qty 1

## 2015-04-03 MED ORDER — GUAIFENESIN ER 600 MG PO TB12
600.0000 mg | ORAL_TABLET | Freq: Two times a day (BID) | ORAL | Status: DC
  Administered 2015-04-03 – 2015-04-10 (×14): 600 mg via ORAL
  Filled 2015-04-03 (×15): qty 1

## 2015-04-03 MED ORDER — MOVING RIGHT ALONG BOOK
Freq: Once | Status: AC
  Administered 2015-04-03: 09:00:00
  Filled 2015-04-03: qty 1

## 2015-04-03 MED ORDER — SODIUM CHLORIDE 0.9 % IV SOLN
250.0000 mL | INTRAVENOUS | Status: DC | PRN
  Administered 2015-04-08: 250 mL via INTRAVENOUS

## 2015-04-03 MED ORDER — FUROSEMIDE 10 MG/ML IJ SOLN
40.0000 mg | Freq: Once | INTRAMUSCULAR | Status: AC
  Administered 2015-04-03: 40 mg via INTRAVENOUS
  Filled 2015-04-03: qty 4

## 2015-04-03 MED ORDER — AMIODARONE HCL IN DEXTROSE 360-4.14 MG/200ML-% IV SOLN
INTRAVENOUS | Status: AC
  Filled 2015-04-03: qty 200

## 2015-04-03 MED ORDER — ALBUTEROL SULFATE (2.5 MG/3ML) 0.083% IN NEBU
2.5000 mg | INHALATION_SOLUTION | RESPIRATORY_TRACT | Status: DC | PRN
  Administered 2015-04-05: 2.5 mg via RESPIRATORY_TRACT
  Filled 2015-04-03: qty 3

## 2015-04-03 MED ORDER — IPRATROPIUM-ALBUTEROL 0.5-2.5 (3) MG/3ML IN SOLN
3.0000 mL | Freq: Three times a day (TID) | RESPIRATORY_TRACT | Status: DC
  Administered 2015-04-03 – 2015-04-10 (×19): 3 mL via RESPIRATORY_TRACT
  Filled 2015-04-03 (×22): qty 3

## 2015-04-03 MED ORDER — DIGOXIN 0.25 MG/ML IJ SOLN: 0.2500 mg | Freq: Every day | INTRAMUSCULAR | Status: DC

## 2015-04-03 MED ORDER — DIGOXIN 125 MCG PO TABS
0.1250 mg | ORAL_TABLET | Freq: Every day | ORAL | Status: DC
  Filled 2015-04-03: qty 1

## 2015-04-03 MED ORDER — AMIODARONE HCL 200 MG PO TABS
200.0000 mg | ORAL_TABLET | Freq: Every day | ORAL | Status: DC
  Administered 2015-04-03: 200 mg via ORAL
  Filled 2015-04-03: qty 1

## 2015-04-03 MED ORDER — SODIUM CHLORIDE 0.9 % IJ SOLN
3.0000 mL | Freq: Two times a day (BID) | INTRAMUSCULAR | Status: DC
  Administered 2015-04-03 – 2015-04-10 (×8): 3 mL via INTRAVENOUS

## 2015-04-03 MED ORDER — POTASSIUM CHLORIDE 10 MEQ/50ML IV SOLN
10.0000 meq | INTRAVENOUS | Status: AC
  Administered 2015-04-03 (×3): 10 meq via INTRAVENOUS
  Filled 2015-04-03 (×3): qty 50

## 2015-04-03 MED ORDER — AMIODARONE HCL IN DEXTROSE 360-4.14 MG/200ML-% IV SOLN
30.0000 mg/h | INTRAVENOUS | Status: DC
  Administered 2015-04-03 – 2015-04-04 (×2): 30 mg/h via INTRAVENOUS
  Filled 2015-04-03 (×3): qty 200

## 2015-04-03 MED ORDER — POTASSIUM CHLORIDE CRYS ER 10 MEQ PO TBCR
10.0000 meq | EXTENDED_RELEASE_TABLET | Freq: Every day | ORAL | Status: DC
  Administered 2015-04-03: 10 meq via ORAL
  Filled 2015-04-03 (×2): qty 1

## 2015-04-03 MED ORDER — MAGNESIUM HYDROXIDE 400 MG/5ML PO SUSP: 30.0000 mL | Freq: Every day | ORAL | Status: DC | PRN

## 2015-04-03 MED ORDER — AMIODARONE HCL 200 MG PO TABS
200.0000 mg | ORAL_TABLET | Freq: Two times a day (BID) | ORAL | Status: DC
  Administered 2015-04-03: 200 mg via ORAL
  Filled 2015-04-03 (×3): qty 1

## 2015-04-03 NOTE — Evaluation (Signed)
Physical Therapy Evaluation Patient Details Name: Valerie Rollins MRN: [id number] DOB: 1935-12-12 Today's Date: 04/03/2015   History of Present Illness  Adm 5/10 for CABG with excision or atrial myxoma PMHx- ankle ORIF 2012 (fell due to syncope)  Clinical Impression  Patient is s/p above surgery resulting in functional limitations due to the deficits listed below (see PT Problem List). Pt with supportive family and was mentally much clearer during PT eval than earlier today. Anticipate good progress. Patient will benefit from skilled PT to increase their independence and safety with mobility to allow discharge to the venue listed below.       Follow Up Recommendations Home health PT;Supervision for mobility/OOB    Equipment Recommendations  None recommended by PT    Recommendations for Other Services OT consult     Precautions / Restrictions Precautions Precautions: Sternal Precaution Comments: educated on precautions and rationale      Mobility  Bed Mobility                  Transfers Overall transfer level: Needs assistance Equipment used: Rolling walker (2 wheeled) Transfers: Sit to/from Stand Sit to Stand: Mod assist;From elevated surface         General transfer comment: pillow in recliner; assist with forward and upward translation  Ambulation/Gait Ambulation/Gait assistance: Min assist Ambulation Distance (Feet): 200 Feet Assistive device: Rolling walker (2 wheeled) Gait Pattern/deviations: Step-through pattern;Decreased stride length;Trunk flexed     General Gait Details: vc for safe use of RW; assist to steer RW; vc for upright posture  Stairs            Wheelchair Mobility    Modified Rankin (Stroke Patients Only)       Balance Overall balance assessment: No apparent balance deficits (not formally assessed)                                           Pertinent Vitals/Pain HR 91 throughout On room air; SaO2 92% or  greater  Pain Assessment: No/denies pain    Home Living Family/patient expects to be discharged to:: Private residence Living Arrangements: Spouse/significant other Available Help at Discharge: Family;Available 24 hours/day Type of Home: House Home Access: Ramped entrance     Home Layout: One level Home Equipment: Walker - 2 wheels;Cane - single point;Bedside commode;Shower seat;Tub bench;Grab bars - tub/shower (tub/shower)      Prior Function Level of Independence: Independent               Hand Dominance        Extremity/Trunk Assessment   Upper Extremity Assessment: Overall WFL for tasks assessed (within her sternal precautions)           Lower Extremity Assessment: Overall WFL for tasks assessed      Cervical / Trunk Assessment: Normal  Communication   Communication: No difficulties  Cognition Arousal/Alertness: Awake/alert Behavior During Therapy: WFL for tasks assessed/performed Overall Cognitive Status: Within Functional Limits for tasks assessed                      General Comments General comments (skin integrity, edema, etc.): husband and daughter present; confirmed home information correct    Exercises        Assessment/Plan    PT Assessment Patient needs continued PT services  PT Diagnosis Difficulty walking   PT Problem List Decreased activity tolerance;Decreased  mobility;Decreased knowledge of use of DME;Decreased knowledge of precautions;Pain  PT Treatment Interventions DME instruction;Gait training;Functional mobility training;Therapeutic activities;Patient/family education   PT Goals (Current goals can be found in the Care Plan section) Acute Rehab PT Goals Patient Stated Goal: return home with family assist PT Goal Formulation: With patient/family Time For Goal Achievement: 04/10/15 Potential to Achieve Goals: Good    Frequency Min 3X/week   Barriers to discharge        Co-evaluation               End of  Session Equipment Utilized During Treatment: Gait belt Activity Tolerance: Patient tolerated treatment well Patient left: in chair;with call bell/phone within reach;with family/visitor present Nurse Communication: Mobility status         Time: 1610-9604 PT Time Calculation (min) (ACUTE ONLY): 29 min   Charges:   PT Evaluation $Initial PT Evaluation Tier I: 1 Procedure PT Treatments $Gait Training: 8-22 mins   PT G Codes:        Marise Knapper 04/18/2015, 11:50 AM Pager [phone]

## 2015-04-03 NOTE — Progress Notes (Signed)
[facility] DAILY ICU PROGRESS NOTE                   [address]            [place] [address]          [phone]   2 Days Post-Op Procedure(s) (LRB): CORONARY ARTERY BYPASS GRAFTING (CABG), ON PUMP, TIMES ONE, USING LEFT INTERNAL MAMMARY ARTERY (N/A) EXCISION OF ATRIAL MYXOMA (N/A) TRANSESOPHAGEAL ECHOCARDIOGRAM (TEE) (N/A)  Total Length of Stay:  LOS: 2 days   Subjective: Had some confusion overnight which was thought to be related to pain med. She is oriented now and has no complaints. Walked already this am.   Objective: Vital signs in last 24 hours: Temp:  [97.5 F (36.4 C)-98.9 F (37.2 C)] 98.6 F (37 C) (05/12 0352) Pulse Rate:  [59-105] 91 (05/12 0700) Cardiac Rhythm:  [-] A-V Sequential paced (05/12 0400) Resp:  [15-26] 23 (05/12 0700) BP: (90-139)/(50-95) 117/70 mmHg (05/12 0700) SpO2:  [92 %-100 %] 97 % (05/12 0700) Arterial Line BP: (77-164)/(53-75) 126/59 mmHg (05/11 1230) Weight:  [187 lb 13.3 oz (85.2 kg)] 187 lb 13.3 oz (85.2 kg) (05/12 0500)  Filed Weights   04/01/15 0552 04/02/15 0556 04/03/15 0500  Weight: 178 lb (80.74 kg) 185 lb 11.2 oz (84.233 kg) 187 lb 13.3 oz (85.2 kg)    Weight change: 2 lb 2.1 oz (0.967 kg)   Hemodynamic parameters for last 24 hours: PAP: (21-22)/(12-14) 22/12 mmHg  Intake/Output from previous day: 05/11 0701 - 05/12 0700 In: 1639.1 [P.O.:540; I.V.:899.1; IV Piggyback:200] Out: 1435 [Urine:1415; Chest Tube:20]  CBGs 144-137-115-112-115    Current Meds: Scheduled Meds: . acetaminophen  1,000 mg Oral 4 times per day   Or  . acetaminophen (TYLENOL) oral liquid 160 mg/5 mL  1,000 mg Per Tube 4 times per day  . amiodarone  200 mg Oral Daily  . aspirin EC  325 mg Oral Daily   Or  . aspirin  324 mg Per Tube Daily  . bisacodyl  10 mg Oral Daily   Or  . bisacodyl  10 mg Rectal Daily  . cholecalciferol  1,000 Units Oral Daily  . docusate sodium  200 mg Oral Daily  . enoxaparin (LOVENOX) injection  30 mg  Subcutaneous Q24H  . furosemide  40 mg Intravenous Once  . insulin aspart  0-24 Units Subcutaneous 6 times per day  . ipratropium-albuterol  3 mL Inhalation Q6H  . latanoprost  1 drop Both Eyes Daily  . loratadine  10 mg Oral Daily  . metoprolol tartrate  12.5 mg Oral BID   Or  . metoprolol tartrate  12.5 mg Per Tube BID  . montelukast  10 mg Oral QHS  . multivitamin with minerals  1 tablet Oral Daily  . pantoprazole  40 mg Oral Daily  . potassium chloride  10 mEq Intravenous Q1 Hr x 3  . sodium chloride  3 mL Intravenous Q12H   Continuous Infusions: . sodium chloride Stopped (04/02/15 0900)  . sodium chloride    . sodium chloride 20 mL/hr at 04/01/15 1900  . lactated ringers 20 mL/hr at 04/01/15 1900  . lactated ringers    . phenylephrine (NEO-SYNEPHRINE) Adult infusion Stopped (04/02/15 0645)   PRN Meds:.sodium chloride, metoprolol, ondansetron (ZOFRAN) IV, sodium chloride, traMADol  Physical Exam: General appearance: alert, cooperative and no distress Heart: regular rate and rhythm Lungs: diminished breath sounds bibasilar Extremities: Mild LE edema Wound: Dressed and dry  Lab Results:  CBC: Recent Labs  04/02/15 1649 04/02/15 1651 04/03/15 0420  WBC 10.6*  --  9.8  HGB 10.0* 9.9* 10.0*  HCT 29.9* 29.0* 29.7*  PLT 144*  --  153   BMET:  Recent Labs  04/02/15 0405  04/02/15 1651 04/03/15 0420  NA 137  --  134* 134*  K 3.5  --  3.8 3.5  CL 106  --  97* 100*  CO2 23  --   --  25  GLUCOSE 136*  --  141* 115*  BUN 7  --  9 10  CREATININE 0.72  < > 0.80 0.91  CALCIUM 8.1*  --   --  8.4*  < > = values in this interval not displayed.  PT/INR:  Recent Labs  04/01/15 1410  LABPROT 18.0*  INR 1.47   Radiology:  FINDINGS: Interim removal of Swan-Ganz catheter, mediastinal drainage catheter, and left chest tube. Cardiomegaly with normal pulmonary vascularity. Low lung volumes with mild basilar atelectasis and/or infiltrates. Mild infiltrate right upper  lobe cannot be excluded. No pleural effusion or pneumothorax .  IMPRESSION: 1. Interim removal of Swan-Ganz catheter, mediastinal drainage catheter, left chest tube. Right IJ sheath in good anatomic position. No pneumothorax.  2. Low lung volumes with bibasilar atelectasis and/or infiltrates. A mild infiltrate in the right upper lobe cannot be excluded on today's exam. Attention to this region on subsequent chest x-rays suggested.  3. Prior CABG. Stable cardiomegaly. No pulmonary venous congestion.   Assessment/Plan: S/P Procedure(s) (LRB): CORONARY ARTERY BYPASS GRAFTING (CABG), ON PUMP, TIMES ONE, USING LEFT INTERNAL MAMMARY ARTERY (N/A) EXCISION OF ATRIAL MYXOMA (N/A) TRANSESOPHAGEAL ECHOCARDIOGRAM (TEE) (N/A)  CV- BPs stable, off all gtts. A-paced. Hopefully can d/c pacer, convert Amio from IV to po.  Pulm- IS/pulm toilet.  Vol overload- diurese.  Confusion overnight, likely related to narcotics.  She takes Ultram regularly at home, so will d/c Oxycodone and continue prn Ultram.  Expected postop blood loss anemia- H/H stable.  Hypokalemia- replace K+.  Hopefully can tx stepdown today.  Valerie Rollins H 04/03/2015 8:10 AM

## 2015-04-03 NOTE — Progress Notes (Signed)
Upon entering the patient's room she became extremely paranoid.  She accused me of "watching her", and refused medications and her morning blood draw.  Patient had previously been alert and oriented.  Charge nurse called to bedside, together we engaged in a long conversation with the patient including several attempts to re-orient the patient.  Eventually the patient was agreeable to take her medications and allow [place] to obtain blood for her lab work.  At the end of our conversation the patient was again completely oriented, will continue to monitor.

## 2015-04-03 NOTE — Progress Notes (Signed)
CT surgery p.m. Rounds  Patient developed atrial fibrillation-back on IV drip of amiodarone in addition to twice a day oral dosing  Ready for transfer to stepdown

## 2015-04-03 NOTE — Progress Notes (Signed)
Pt transferred to 2W20 via wheelchair on telemetry monitoring. VSS and pt tolerated transfer well. Pt assisted to bed and received by 2W RN.

## 2015-04-03 NOTE — Progress Notes (Signed)
2 Days Post-Op Procedure(s) (LRB): CORONARY ARTERY BYPASS GRAFTING (CABG), ON PUMP, TIMES ONE, USING LEFT INTERNAL MAMMARY ARTERY (N/A) EXCISION OF ATRIAL MYXOMA (N/A) TRANSESOPHAGEAL ECHOCARDIOGRAM (TEE) (N/A) Subjective: Slow junctional rhythm- AV pacing Confusion early am resolved Long standing LBP but walking in hall tx to stepdown  Objective: Vital signs in last 24 hours: Temp:  [97.5 F (36.4 C)-98.9 F (37.2 C)] 98.3 F (36.8 C) (05/12 0700) Pulse Rate:  [59-105] 91 (05/12 0800) Cardiac Rhythm:  [-] A-V Sequential paced (05/12 0800) Resp:  [15-26] 21 (05/12 0800) BP: (90-139)/(50-95) 119/72 mmHg (05/12 0800) SpO2:  [92 %-99 %] 97 % (05/12 0843) Arterial Line BP: (77-164)/(53-75) 126/59 mmHg (05/11 1230) Weight:  [187 lb 13.3 oz (85.2 kg)] 187 lb 13.3 oz (85.2 kg) (05/12 0500)  Hemodynamic parameters for last 24 hours: PAP: (22)/(12) 22/12 mmHg  Intake/Output from previous day: 05/11 0701 - 05/12 0700 In: 1639.1 [P.O.:540; I.V.:899.1; IV Piggyback:200] Out: 1435 [Urine:1415; Chest Tube:20] Intake/Output this shift: Total I/O In: 131.8 [I.V.:81.8; IV Piggyback:50] Out: 200 [Urine:200]  Lungs clear Neuro intact  Lab Results:  Recent Labs  04/02/15 1649 04/02/15 1651 04/03/15 0420  WBC 10.6*  --  9.8  HGB 10.0* 9.9* 10.0*  HCT 29.9* 29.0* 29.7*  PLT 144*  --  153   BMET:  Recent Labs  04/02/15 0405  04/02/15 1651 04/03/15 0420  NA 137  --  134* 134*  K 3.5  --  3.8 3.5  CL 106  --  97* 100*  CO2 23  --   --  25  GLUCOSE 136*  --  141* 115*  BUN 7  --  9 10  CREATININE 0.72  < > 0.80 0.91  CALCIUM 8.1*  --   --  8.4*  < > = values in this interval not displayed.  PT/INR:  Recent Labs  04/01/15 1410  LABPROT 18.0*  INR 1.47   ABG    Component Value Date/Time   PHART 7.347* 04/02/2015 0006   HCO3 20.8 04/02/2015 0006   TCO2 20 04/02/2015 1651   ACIDBASEDEF 4.0* 04/02/2015 0006   O2SAT 95.0 04/02/2015 0006   CBG (last 3)   Recent  Labs  04/02/15 1910 04/02/15 2329 04/03/15 0343  GLUCAP 137* 115* 112*    Assessment/Plan: S/P Procedure(s) (LRB): CORONARY ARTERY BYPASS GRAFTING (CABG), ON PUMP, TIMES ONE, USING LEFT INTERNAL MAMMARY ARTERY (N/A) EXCISION OF ATRIAL MYXOMA (N/A) TRANSESOPHAGEAL ECHOCARDIOGRAM (TEE) (N/A) Mobilize Diuresis Plan for transfer to step-down: see transfer orders   LOS: 2 days    Tharon Aquas Trigt III 04/03/2015

## 2015-04-04 ENCOUNTER — Inpatient Hospital Stay (HOSPITAL_COMMUNITY): Payer: PPO

## 2015-04-04 LAB — CBC
HCT: 30.6 % — ABNORMAL LOW (ref 36.0–46.0)
Hemoglobin: 10.3 g/dL — ABNORMAL LOW (ref 12.0–15.0)
MCH: 28.1 pg (ref 26.0–34.0)
MCHC: 33.7 g/dL (ref 30.0–36.0)
MCV: 83.6 fL (ref 78.0–100.0)
Platelets: 165 10*3/uL (ref 150–400)
RBC: 3.66 MIL/uL — ABNORMAL LOW (ref 3.87–5.11)
RDW: 13.6 % (ref 11.5–15.5)
WBC: 8.6 10*3/uL (ref 4.0–10.5)

## 2015-04-04 LAB — BASIC METABOLIC PANEL
Anion gap: 10 (ref 5–15)
BUN: 12 mg/dL (ref 6–20)
CO2: 24 mmol/L (ref 22–32)
Calcium: 8.7 mg/dL — ABNORMAL LOW (ref 8.9–10.3)
Chloride: 103 mmol/L (ref 101–111)
Creatinine, Ser: 0.95 mg/dL (ref 0.44–1.00)
GFR calc Af Amer: >60 mL/min (ref >60)
GFR calc non Af Amer: 57 mL/min — ABNORMAL LOW (ref >60)
Glucose, Bld: 123 mg/dL — ABNORMAL HIGH (ref 65–99)
Potassium: 3.8 mmol/L (ref 3.5–5.1)
Sodium: 137 mmol/L (ref 135–145)

## 2015-04-04 MED ORDER — AMIODARONE HCL 200 MG PO TABS
200.0000 mg | ORAL_TABLET | Freq: Two times a day (BID) | ORAL | Status: DC
  Administered 2015-04-04 – 2015-04-05 (×3): 200 mg via ORAL
  Filled 2015-04-04 (×5): qty 1

## 2015-04-04 MED ORDER — POTASSIUM CHLORIDE CRYS ER 20 MEQ PO TBCR
20.0000 meq | EXTENDED_RELEASE_TABLET | Freq: Every day | ORAL | Status: DC
  Administered 2015-04-04 – 2015-04-10 (×7): 20 meq via ORAL
  Filled 2015-04-04 (×7): qty 1

## 2015-04-04 MED ORDER — FUROSEMIDE 40 MG PO TABS
40.0000 mg | ORAL_TABLET | Freq: Every day | ORAL | Status: DC
  Administered 2015-04-04: 40 mg via ORAL
  Filled 2015-04-04 (×3): qty 1

## 2015-04-04 MED ORDER — METOPROLOL TARTRATE 12.5 MG HALF TABLET
12.5000 mg | ORAL_TABLET | Freq: Once | ORAL | Status: AC
  Administered 2015-04-04: 12.5 mg via ORAL
  Filled 2015-04-04: qty 1

## 2015-04-04 MED ORDER — METOPROLOL TARTRATE 25 MG PO TABS
25.0000 mg | ORAL_TABLET | Freq: Two times a day (BID) | ORAL | Status: DC
  Administered 2015-04-04 – 2015-04-06 (×5): 25 mg via ORAL
  Filled 2015-04-04 (×7): qty 1

## 2015-04-04 MED ORDER — AMIODARONE HCL IN DEXTROSE 360-4.14 MG/200ML-% IV SOLN
30.0000 mg/h | INTRAVENOUS | Status: DC
  Administered 2015-04-04: 30 mg/h via INTRAVENOUS
  Filled 2015-04-04 (×5): qty 200

## 2015-04-04 MED ORDER — POTASSIUM CHLORIDE CRYS ER 20 MEQ PO TBCR
20.0000 meq | EXTENDED_RELEASE_TABLET | Freq: Once | ORAL | Status: AC
  Administered 2015-04-04: 20 meq via ORAL
  Filled 2015-04-04: qty 1

## 2015-04-04 MED ORDER — METOPROLOL TARTRATE 25 MG/10 ML ORAL SUSPENSION: 12.5000 mg | Freq: Two times a day (BID) | ORAL | Status: DC

## 2015-04-04 MED ORDER — AMIODARONE HCL IN DEXTROSE 360-4.14 MG/200ML-% IV SOLN: 30.0000 mg/h | INTRAVENOUS | Status: DC

## 2015-04-04 MED ORDER — AMIODARONE LOAD VIA INFUSION
150.0000 mg | Freq: Once | INTRAVENOUS | Status: AC
  Administered 2015-04-04: 150 mg via INTRAVENOUS
  Filled 2015-04-04: qty 83.34

## 2015-04-04 MED ORDER — AMIODARONE HCL 200 MG PO TABS
200.0000 mg | ORAL_TABLET | Freq: Two times a day (BID) | ORAL | Status: DC
  Filled 2015-04-04 (×2): qty 1

## 2015-04-04 MED ORDER — AMIODARONE LOAD VIA INFUSION: 150.0000 mg | Freq: Once | INTRAVENOUS | Status: DC

## 2015-04-04 MED FILL — Magnesium Sulfate Inj 50%: INTRAMUSCULAR | Qty: 10 | Status: AC

## 2015-04-04 MED FILL — Potassium Chloride Inj 2 mEq/ML: INTRAVENOUS | Qty: 40 | Status: AC

## 2015-04-04 MED FILL — Heparin Sodium (Porcine) Inj 1000 Unit/ML: INTRAMUSCULAR | Qty: 30 | Status: AC

## 2015-04-04 NOTE — Progress Notes (Signed)
Utilization review completed.  

## 2015-04-04 NOTE — Progress Notes (Signed)
Do not start Eliquis per Dr. Prescott Gum.  Rowe Pavy, RN

## 2015-04-04 NOTE — Progress Notes (Signed)
Notified MD of 2 separate occurences of pauses and HR fluctuating from 40-50s to 110-120s; MD ordered for VVI 50 sensitivity at 2, will continue to monitor and assess.  Rowe Pavy, RN

## 2015-04-04 NOTE — Progress Notes (Signed)
Nurse just informed me patient's HR increased into the 110=120's and is in a fib. External pacer "all over the place" despite turning down sensitivity. I instructed nurse to turn off pace but leave connected, increase Lopressor to 25 mg bid, and will restart low dose oral Amiodarone. Cardiology to evaluate.

## 2015-04-04 NOTE — Consult Note (Signed)
CARDIOLOGY CONSULT NOTE  Patient ID: Valerie Rollins MRN: [id number] DOB/AGE: 1936-01-13 79 y.o.  Admit date: 04/01/2015 Primary Cardiologist: Dr Stanford Breed Reason for Consultation: Post-operative atrial fibrillation  HPI: 79 yo with history of HTN, prior DVT, and post-op atrial fibrillation after ankle surgery was found on echo (ordered because of enlarged heart on CXR) to have LA mass.  This was suspected to be a myxoma.  She then had cardiac cath showing serial 70% and 80% LAD stenoses.  She saw Dr Prescott Gum and had cardiac surgery on 04/01/15 with removal of LA myxoma, oversewing of LA appendage, and LIMA-LAD.  Post-op, she developed atrial fibrillation with RVR (last night) and was started on amiodarone gtt.  HR dipped into the 50s, so amiodarone gtt was stopped.  This morning, she is back in atrial fibrillation with RVR, HR in 110s-120s.  She does not feel the atrial fibrillation.  She is hemodynamically stable.   Review of systems complete and found to be negative unless listed above in HPI  Past Medical History: 1. HTN 2. Prior DVT 3. Atrial fibrillation: Paroxysmal.  Had post-op atrial fibrillation after 3/14 ankle surgery.  Now with post-op atrial fibrillation after myxoma removal.  She had LA appendage ligation at time of LA myxoma removal.  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  5. IBS 6. TAH/BSO 7. CAD: LHC (5/16) with serial 70% and 80% LAD stenoses, now s/p LIMA-LAD at time of LA myxoma removal.  8. Echo (4/16) with EF 55-60%, LA mass concerning for myxoma.   Family History  Problem Relation Age of Onset  . Heart attack Father 76  . Alzheimer's disease Mother     TIAs  . Hyperlipidemia Brother   . Hypertension Brother   . Diverticulitis Daughter     S/P colectomy  . Colon cancer Maternal Grandmother   . Heart attack Paternal Grandmother 82  . Diabetes Neg Hx   . Stroke Neg Hx     History   Social History  . Marital Status: Married    Spouse Name: N/A  . Number of Children:  2  . Years of Education: N/A   Occupational History  . Retired    Social History Main Topics  . Smoking status: Never Smoker   . Smokeless tobacco: Never Used  . Alcohol Use: No  . Drug Use: No  . Sexual Activity: Not on file   Other Topics Concern  . Not on file   Social History Narrative   REG EXERCISE   HAD COLONOSCOPY AND ENDOSCOPY DONE DATES UNKNOWN     Facility-administered medications prior to admission  Medication Dose Route Frequency Provider Last Rate Last Dose  . pneumococcal 13-valent conjugate vaccine (PREVNAR 13) injection 0.5 mL  0.5 mL Intramuscular Tomorrow-1000 Hendricks Limes, MD       Prescriptions prior to admission  Medication Sig Dispense Refill Last Dose  . aspirin EC 81 MG tablet Take 81 mg by mouth daily.   04/01/2015 at 0230  . calcium citrate-vitamin D (CITRACAL+D) 315-200 MG-UNIT per tablet Take 2 tablets by mouth 2 (two) times daily.   03/31/2015 at Unknown time  . carvedilol (COREG) 25 MG tablet Take 1 tablet (25 mg total) by mouth 2 (two) times daily with a meal. 180 tablet 3 04/01/2015 at 0230  . Cholecalciferol (VITAMIN D3) 1000 UNITS CAPS Take 1,000 Units by mouth daily.    03/31/2015 at Unknown time  . fexofenadine (ALLEGRA) 180 MG tablet Take 180 mg by mouth as needed.  03/31/2015 at Unknown time  . furosemide (LASIX) 20 MG tablet Take 1 tablet (20 mg total) by mouth daily. 90 tablet 3 Past Week at Unknown time  . Ipratropium-Albuterol (COMBIVENT RESPIMAT) 20-100 MCG/ACT AERS respimat Inhale 1 puff into the lungs every 6 (six) hours. 4 g 5 04/01/2015 at Unknown time  . latanoprost (XALATAN) 0.005 % ophthalmic solution Place 1 drop into both eyes daily.     04/01/2015 at 0230  . montelukast (SINGULAIR) 10 MG tablet TAKE  ONE TABLET BY MOUTH NIGHTLY AT BEDTIME 90 tablet 1 03/31/2015 at Unknown time  . omeprazole (PRILOSEC) 20 MG capsule TAKE ONE CAPSULE BY MOUTH ONE TIME DAILY 90 capsule 3 04/01/2015 at 0230  . traMADol (ULTRAM) 50 MG tablet Take 50 mg by  mouth every 6 (six) hours as needed for pain. Dr.Ramos   03/31/2015 at Unknown time  . colestipol (MICRONIZED COLESTIPOL HCL) 1 G tablet Take 1 tablet (1 g total) by mouth 2 (two) times daily. (Patient taking differently: Take 1 g by mouth as needed. ) 60 tablet 11 More than a month at Unknown time  . dicyclomine (BENTYL) 20 MG tablet Take 1 tablet (20 mg total) by mouth every 6 (six) hours as needed for spasms. 180 tablet 3 More than a month at Unknown time  . Multiple Vitamin (MULITIVITAMIN WITH MINERALS) TABS Take 1 tablet by mouth daily.   More than a month at Unknown time   Current Scheduled Meds: . acetaminophen  1,000 mg Oral 4 times per day   Or  . acetaminophen (TYLENOL) oral liquid 160 mg/5 mL  1,000 mg Per Tube 4 times per day  . amiodarone  200 mg Oral BID  . aspirin EC  325 mg Oral Daily   Or  . aspirin  324 mg Per Tube Daily  . bisacodyl  10 mg Oral Daily   Or  . bisacodyl  10 mg Rectal Daily  . cholecalciferol  1,000 Units Oral Daily  . docusate sodium  200 mg Oral Daily  . enoxaparin (LOVENOX) injection  30 mg Subcutaneous Q24H  . furosemide  40 mg Oral Daily  . guaiFENesin  600 mg Oral BID  . ipratropium-albuterol  3 mL Inhalation TID  . latanoprost  1 drop Both Eyes Daily  . loratadine  10 mg Oral Daily  . metoprolol tartrate  12.5 mg Oral Once  . metoprolol tartrate  25 mg Oral BID  . montelukast  10 mg Oral QHS  . multivitamin with minerals  1 tablet Oral Daily  . pantoprazole  40 mg Oral Daily  . potassium chloride  20 mEq Oral Daily  . sodium chloride  3 mL Intravenous Q12H  . sodium chloride  3 mL Intravenous Q12H   Continuous Infusions: . sodium chloride Stopped (04/02/15 0900)  . lactated ringers Stopped (04/03/15 1300)   PRN Meds:.sodium chloride, sodium chloride, albuterol, magnesium hydroxide, metoprolol, ondansetron (ZOFRAN) IV, sodium chloride, sodium chloride, traMADol   Physical exam Blood pressure 137/91, pulse 114, temperature 97.9 F (36.6  C), temperature source Oral, resp. rate 18, height 5' 5.5" (1.664 m), weight 183 lb 9.6 oz (83.28 kg), SpO2 95 %. General: NAD Neck: JVP 8-9 cm, no thyromegaly or thyroid nodule.  Lungs: Decreased breath sounds at bases bilaterally.  CV: Nondisplaced PMI.  Heart tachy, irregular S1/S2, no S3/S4, no murmur.  Trace ankle edema.   Abdomen: Soft, nontender, no hepatosplenomegaly, no distention.  Skin: Healing sternotomy incision Neurologic: Alert and oriented x 3.  Psych: Normal  affect. Extremities: No clubbing or cyanosis.  HEENT: Normal.   Labs:   Lab Results  Component Value Date   WBC 8.6 04/04/2015   HGB 10.3* 04/04/2015   HCT 30.6* 04/04/2015   MCV 83.6 04/04/2015   PLT 165 04/04/2015    Recent Labs Lab 03/31/15 1333  04/04/15 0350  NA 138  < > 137  K 4.1  < > 3.8  CL 105  < > 103  CO2 23  < > 24  BUN 19  < > 12  CREATININE 0.85  < > 0.95  CALCIUM 9.5  < > 8.7*  PROT 6.5  --   --   BILITOT 0.6  --   --   ALKPHOS 73  --   --   ALT 17  --   --   AST 23  --   --   GLUCOSE 100*  < > 123*  < > = values in this interval not displayed. Lab Results  Component Value Date   CKTOTAL 128 11/15/2011   CKMB 3.0 11/15/2011   TROPONINI <0.30 11/15/2011      EKG: Atrial fibrillation with RVR  ASSESSMENT AND PLAN: 79 yo s/p cardiac surgery with myxoma resection and LIMA-LAD developed post-op atrial fibrillation.  1. Atrial fibrillation: Paroxysmal.  She had initial episode in 3/14 post-op ankle surgery.  She was not on anticoagulation prior to her current surgery.  She has again developed atrial fibrillation with RVR.  - Rebolus IV amiodarone and restart drip to control rate.   - Given recurrent atrial fibrillation, would recommend starting her on Eliquis 5 mg bid.  She has had oversewing of the LA appendage, but no definitive evidence that this is as effective as anticoagulation and she has no contraindication to anticoagulation. She can start Eliquis when deemed stable by  surgery.  - If she remains in atrial fibrillation despite anticoagulation, can have TEE-guided cardioversion after 5 doses of Eliquis or if HR is well-controlled, could continue Eliquis x 1 month then cardiovert without TEE.  2. Acute on chronic diastolic CHF: Mild volume overload post-op.  She was started today on po Lasix.  Follow I/Os and weight.  3. CAD: s/p LIMA-LAD at time of myxoma resection.  Would add statin to her regimen prior to discharge.   Loralie Champagne 04/04/2015 10:58 AM

## 2015-04-04 NOTE — Evaluation (Addendum)
Clinical/Bedside Swallow Evaluation Patient Details  Name: SHAHED YEOMAN MRN: [id number] Date of Birth: [birth year]  Today's Date: 04/04/2015 Time: SLP Start Time (ACUTE ONLY): 37 SLP Stop Time (ACUTE ONLY): 1058 SLP Time Calculation (min) (ACUTE ONLY): 18 min  Past Medical History:  Past Medical History  Diagnosis Date  . Diverticulitis 2002    based on CT scan  . Hyperlipidemia   . Hypertension   . Superficial phlebitis      X 2, 1999, 2000  . Ocular hypertension     glaucoma suspect, Dr. Kathrin Penner  . Skin cancer   . Endometriosis   . Gastritis 2006    @ Endo  . Hiatal hernia   . Diverticulosis     Dr Henrene Pastor  . GERD (gastroesophageal reflux disease)   . DVT (deep venous thrombosis) 2000    no trigger  . Syncope 11/12/11    in context CAP . Carotid Doppler exam was negative. 2D echocardiogram revealed mild dilation of the left atrium and moderate increase in the systolic pressureof  the pulmonary artery  . Atrial fibrillation     Post op after repair of ankle fracture  . UTI (lower urinary tract infection) 01/2013    Strep bovis ; PCN sensitive  . IBS (irritable bowel syndrome)    Past Surgical History:  Past Surgical History  Procedure Laterality Date  . Total abdominal hysterectomy w/ bilateral salpingoophorectomy  1980    dysfunctional menses, endometriosis  . Varicose vein surgery      1960, 1965, 2009  . Tonsillectomy    . Cataract extraction w/ intraocular lens implant      OS; Dr Kathrin Penner  . Colonoscopy  1995, 2002, 2012    diverticulosis; Dr Henrene Pastor  . Orif ankle fracture  11/14/2011    Procedure: OPEN REDUCTION INTERNAL FIXATION (ORIF) ANKLE FRACTURE;  Surgeon: Colin Rhein;  Location: [facility];  Service: Orthopedics;  Laterality: Left;  open reduction internal fixation trimalleolar ankle fracture  . Cardiac catheterization N/A 03/28/2015    Procedure: Left Heart Cath and Coronary Angiography;  Surgeon: Jolaine Artist, MD;  Location: [facility] INVASIVE CV LAB  CUPID;  Service: Cardiovascular;  Laterality: N/A;  . Coronary artery bypass graft N/A 04/01/2015    Procedure: CORONARY ARTERY BYPASS GRAFTING (CABG), ON PUMP, TIMES ONE, USING LEFT INTERNAL MAMMARY ARTERY;  Surgeon: Ivin Poot, MD;  Location: [facility];  Service: Open Heart Surgery;  Laterality: N/A;  . Excision of atrial myxoma N/A 04/01/2015    Procedure: EXCISION OF ATRIAL MYXOMA;  Surgeon: Ivin Poot, MD;  Location: [facility];  Service: Open Heart Surgery;  Laterality: N/A;  . Tee without cardioversion N/A 04/01/2015    Procedure: TRANSESOPHAGEAL ECHOCARDIOGRAM (TEE);  Surgeon: Ivin Poot, MD;  Location: [facility];  Service: Open Heart Surgery;  Laterality: N/A;   HPI:  Adm 5/10 for CABG with excision or atrial myxoma. PMHx- ankle ORIF 2012, HTN, gastritis, A-fib, syncope, GERD, hiatial hernia. CXR 5/12 Low lung volumes with bibasilar atelectasis and/or infiltrates and repeat CXR 5/13 no evidence of infiltrate noted in the right upper lobe on today's exam.   Assessment / Plan / Recommendation Clinical Impression  Pt reports intermittent pharyngeal globus sensation with history of GERD and takes Omeprazole at home. Globus sensation was prior to CABG and she does not report worsening post CABG. Oral and pharyngeal phases of swallow appear within functional limits. Educated pt on reflux strategies. Recommend she continue regular texture diet and thin liquids, no further  ST needed.    Aspiration Risk  Mild    Diet Recommendation Thin (regular texture)   Medication Administration: Whole meds with liquid Compensations: Slow rate;Small sips/bites;Follow solids with liquid    Other  Recommendations Oral Care Recommendations: Oral care BID   Follow Up Recommendations       Frequency and Duration        Pertinent Vitals/Pain none         Swallow Study       Oral/Motor/Sensory Function Overall Oral Motor/Sensory Function: Appears within functional limits for tasks assessed   Ice  Chips Ice chips: Not tested   Thin Liquid Thin Liquid: Within functional limits Presentation: Cup;Straw    Nectar Thick Nectar Thick Liquid: Not tested   Honey Thick Honey Thick Liquid: Not tested   Puree Puree: Within functional limits   Solid   GO    Solid: Within functional limits       Houston Siren 04/04/2015,12:23 PM  Orbie Pyo Colvin Caroli.[employer] [phone]

## 2015-04-04 NOTE — Care Management Note (Signed)
Case Management Note  Patient Details  Name: Valerie Rollins MRN: [id number] Date of Birth: 1935-11-26  Subjective/Objective:   Pt admitted s/p CABG x1              Action/Plan: PTA pt lived at home with spouse- plan to return home - per pt she has needed DME at home that includes- RW, tub bench, BSC, and ramp. Do not anticipate any HH or DME needs for discharge  Expected Discharge Date:                  Expected Discharge Plan:  Home/Self Care  In-House Referral:     Discharge planning Services  CM Consult  Post Acute Care Choice:    Choice offered to:     DME Arranged:    DME Agency:     HH Arranged:    HH Agency:     Status of Service:  In process, will continue to follow  Medicare Important Message Given:  Yes Date Medicare IM Given:  04/04/15 Medicare IM give by:  Marvetta Gibbons Date Additional Medicare IM Given:    Additional Medicare Important Message give by:     If discussed at [place] of Stay Meetings, dates discussed:    Additional Comments:  Dawayne Patricia, RN 04/04/2015, 10:41 AM

## 2015-04-04 NOTE — Progress Notes (Signed)
Following the Amio bolus ordered the patient had a 3.24 second pause and HR dropped to the 30s-40s, Amio was turned off and patient is now being paced at 80, BP 111/68, notified Dr. Aundra Dubin, Dr. Aundra Dubin ordered for PO Amio to be restarted.  Rowe Pavy, RN

## 2015-04-04 NOTE — Progress Notes (Signed)
PT Cancellation Note  Patient Details Name: Valerie Rollins MRN: [id number] DOB: Mar 15, 1936   Cancelled Treatment:    Reason Eval/Treat Not Completed: Medical issues which prohibited therapy   Noted events of today re: afib, pacing turned off and now back on. Pacing again not sensing HR 106-118 and is delivering paced beats at rate of 80 per minute. Discussed with RN and agree to hold incr activity at this time. RN stated she will help pt walk later today if her rhythm allows.   [name] 04/04/2015, 2:23 PM Pager [phone]

## 2015-04-04 NOTE — Progress Notes (Addendum)
      [address]       [place] [address]             [phone]        3 Days Post-Op Procedure(s) (LRB): CORONARY ARTERY BYPASS GRAFTING (CABG), ON PUMP, TIMES ONE, USING LEFT INTERNAL MAMMARY ARTERY (N/A) EXCISION OF ATRIAL MYXOMA (N/A) TRANSESOPHAGEAL ECHOCARDIOGRAM (TEE) (N/A)  Subjective: Patient without specific complaints this am.  Objective: Vital signs in last 24 hours: Temp:  [97.9 F (36.6 C)-99.6 F (37.6 C)] 97.9 F (36.6 C) (05/13 0345) Pulse Rate:  [57-121] 116 (05/13 0345) Cardiac Rhythm:  [-] Atrial fibrillation (05/12 2100) Resp:  [14-27] 18 (05/13 0345) BP: (102-136)/(63-87) 135/87 mmHg (05/13 0345) SpO2:  [93 %-100 %] 97 % (05/13 0345) Weight:  [183 lb 9.6 oz (83.28 kg)] 183 lb 9.6 oz (83.28 kg) (05/13 0345)  Pre op weight 81 kg Current Weight  04/04/15 183 lb 9.6 oz (83.28 kg)      Intake/Output from previous day: 05/12 0701 - 05/13 0700 In: 504.1 [P.O.:200; I.V.:254.1; IV Piggyback:50] Out: 2400 [Urine:2400]   Physical Exam:  Cardiovascular: IRRR Pulmonary: Slightly diminished at bases; no rales, wheezes, or rhonchi. Abdomen: Soft, non tender, bowel sounds present. Extremities: Mild bilateral lower extremity edema. Wounds: Aquacel dressing removed and wound is clean and dry.  No erythema or signs of infection.  Lab Results: CBC: Recent Labs  04/03/15 0420 04/04/15 0350  WBC 9.8 8.6  HGB 10.0* 10.3*  HCT 29.7* 30.6*  PLT 153 165   BMET:  Recent Labs  04/03/15 1608 04/04/15 0350  NA 134* 137  K 5.8* 3.8  CL 99* 103  CO2 26 24  GLUCOSE 214* 123*  BUN 11 12  CREATININE 1.03* 0.95  CALCIUM 8.4* 8.7*    PT/INR:  Lab Results  Component Value Date   INR 1.47 04/01/2015   INR 1.07 03/31/2015   INR 1.11 03/25/2015   ABG:  INR: Will add last result for INR, ABG once components are confirmed Will add last 4 CBG results once components are confirmed  Assessment/Plan:  1. CV - Previous a fib with  RVR. On Amiodarone drip, Lopressor 12.5 mg bid, and Amiodarone 200 mg bid, Digoxin 0.125 mg daily. She was bradycardic in the 50's earlier this am so she is now paced. Amiodarone drip stopped and will stop Digoxin and oral Amiodarone for now. Will ask cardiology to see. Cardiology recommends starting Eliquis. Will defer to Dr. Prescott Gum. 2.  Pulmonary - On room air. CXR this am shows no pneumothorax, low lung volumes, bibasilar atelectasis, small pleural effusions, and stable cardiomegaly. Encourage incentive spiormeter and flutter valve 3. Volume Overload - Will give Lasix 40 mg 4.  Acute blood loss anemia - H and H stable at 10.3 and 30.6 5. Supplement potassium 6.LOC in am if no bowel movement 7. CBGs  98/112/125. On SS PRN. Pre op HGA1C 5.8. Will stop accu checks and SS PRN  ZIMMERMAN,DONIELLE MPA-C 04/04/2015,8:31 AM  The patient is not a candidate for Eliqus  following cardiac surgery. If anticoagulation is felt to be necessary short-term Coumadin would be recommended. The left atrial appendage has been oversewn.  patient examined and medical record reviewed,agree with above note. Tharon Aquas Trigt III 04/04/2015

## 2015-04-04 NOTE — Progress Notes (Signed)
[id number] Came to walk with pt. Rhythm atrial fib up to 128 but pacing spikes not sensing and before,in and after the QRS.  . RN addressed this with PA.  Not to walk until rhythm stable. Discussed with pt that IS needs to be used hourly 10x. PT to see later. BP 137/91, heart rate to 120s. Graylon Good RN BSN 04/04/2015 10:19 AM

## 2015-04-05 DIAGNOSIS — I480 Paroxysmal atrial fibrillation: Secondary | ICD-10-CM

## 2015-04-05 DIAGNOSIS — Z951 Presence of aortocoronary bypass graft: Secondary | ICD-10-CM

## 2015-04-05 DIAGNOSIS — I5033 Acute on chronic diastolic (congestive) heart failure: Secondary | ICD-10-CM

## 2015-04-05 MED ORDER — FUROSEMIDE 40 MG PO TABS
40.0000 mg | ORAL_TABLET | Freq: Two times a day (BID) | ORAL | Status: DC
  Administered 2015-04-05 (×2): 40 mg via ORAL
  Filled 2015-04-05 (×5): qty 1

## 2015-04-05 MED ORDER — SALINE SPRAY 0.65 % NA SOLN
1.0000 | NASAL | Status: DC | PRN
  Filled 2015-04-05: qty 44

## 2015-04-05 MED ORDER — SENNA 8.6 MG PO TABS
1.0000 | ORAL_TABLET | Freq: Every day | ORAL | Status: DC | PRN
  Administered 2015-04-05: 8.6 mg via ORAL
  Filled 2015-04-05 (×2): qty 1

## 2015-04-05 MED ORDER — DOCUSATE SODIUM 100 MG PO CAPS
100.0000 mg | ORAL_CAPSULE | Freq: Every day | ORAL | Status: DC | PRN
  Administered 2015-04-05: 100 mg via ORAL
  Filled 2015-04-05: qty 1

## 2015-04-05 NOTE — Progress Notes (Signed)
Patient was unable to ambulate in hallway around 2200 because she developed a minor nose bleed and opt to rest the remainder of the evening. [place]

## 2015-04-05 NOTE — Progress Notes (Signed)
Patient was able to walk this morning with a front wheel walker and accompanied with Lyra Alaimo and Financial planner. 6:05 AM Shirley Muscat

## 2015-04-05 NOTE — Progress Notes (Addendum)
       [address]       [place] [address]             [phone]          4 Days Post-Op Procedure(s) (LRB): CORONARY ARTERY BYPASS GRAFTING (CABG), ON PUMP, TIMES ONE, USING LEFT INTERNAL MAMMARY ARTERY (N/A) EXCISION OF ATRIAL MYXOMA (N/A) TRANSESOPHAGEAL ECHOCARDIOGRAM (TEE) (N/A)  Subjective: Feels well this am.  Had some dyspnea overnight and while walking, but had a breathing tx this am and now breathing stable.   Objective: Vital signs in last 24 hours: Patient Vitals for the past 24 hrs:  BP Temp Temp src Pulse Resp SpO2 Weight  04/05/15 0911 - - - - - 98 % -  04/05/15 0607 - - - - - 98 % -  04/05/15 0456 105/60 mmHg 97.7 F (36.5 C) Oral (!) 50 17 97 % 183 lb 11.2 oz (83.326 kg)  04/04/15 2143 133/73 mmHg - - (!) 56 - - -  04/04/15 2050 - - - - - 97 % -  04/04/15 2029 (!) 133/57 mmHg 97.9 F (36.6 C) Oral (!) 56 18 98 % -  04/04/15 1403 122/65 mmHg 97.6 F (36.4 C) Oral 100 18 96 % -  04/04/15 1401 - - - - - 96 % -   Current Weight  04/05/15 183 lb 11.2 oz (83.326 kg)  Pre-op weight = 84 kg   Intake/Output from previous day: 05/13 0701 - 05/14 0700 In: 1190 [P.O.:1190] Out: 650 [Urine:650]    PHYSICAL EXAM:  Heart: RRR Lungs: Clear Wound: Clean and dry Extremities: Mild LE edema    Lab Results: CBC: Recent Labs  04/03/15 0420 04/04/15 0350  WBC 9.8 8.6  HGB 10.0* 10.3*  HCT 29.7* 30.6*  PLT 153 165   BMET:  Recent Labs  04/03/15 1608 04/04/15 0350  NA 134* 137  K 5.8* 3.8  CL 99* 103  CO2 26 24  GLUCOSE 214* 123*  BUN 11 12  CREATININE 1.03* 0.95  CALCIUM 8.4* 8.7*    PT/INR: No results for input(s): LABPROT, INR in the last 72 hours.    Assessment/Plan: S/P Procedure(s) (LRB): CORONARY ARTERY BYPASS GRAFTING (CABG), ON PUMP, TIMES ONE, USING LEFT INTERNAL MAMMARY ARTERY (N/A) EXCISION OF ATRIAL MYXOMA (N/A) TRANSESOPHAGEAL ECHOCARDIOGRAM (TEE) (N/A)  CV- PAF, now SB/SR.  Dig d/c'ed due to  bradycardia. Continue po Amiodarone, Lopressor and watch.  If AF recurs and she needs anticoagulation, Dr. Prescott Gum recommends Coumadin rather than Eliquis.  Pulm- continue pulm toilet, IS.  Vol overload- diurese.  CRPI.  Home once rhythm issues resolved.   LOS: 4 days    COLLINS,GINA H 04/05/2015   Chart reviewed, patient examined, agree with above. Dr. Darcey Nora does not want to use Eliquis in this patient and feels that coumadin would be safer.

## 2015-04-05 NOTE — Progress Notes (Signed)
CARDIAC REHAB PHASE I   PRE:  Rate/Rhythm: 60 junctional  BP:  Sitting: 130/76      SaO2: 94 RA  MODE:  Ambulation: 300 ft   POST:  Rate/Rhythm: 61 junctional back to 122 a fib  BP:  Sitting: 140/96     SaO2: 97 RA  1355-1420 Patient ambulated in hallway x 1 assist with RW. Steady gait noted. Standing rest break for SOB x 1. Pursed lip breathing encouraged. Patient verbalized relief from SOB with breathing technique. Denied all other complaints. Post ambulation patient assisted to bedside potty. Daughter at bedside. Call bell in reach. Patient back in afib while on bedside potty. Katharine Look, RN notified of patients rhythm and BP.  Santina Evans, BSN 04/05/2015 2:28 PM

## 2015-04-05 NOTE — Progress Notes (Signed)
Patient Name: Valerie Rollins Date of Encounter: 04/05/2015  Active Problems:   S/P CABG x 1   Length of Stay: 4  SUBJECTIVE  The patient tried to walk today and became significantly SOB, that was relieved by a breathing treatment. Feeling well now, denies chest pain or palpitations.   CURRENT MEDS . acetaminophen  1,000 mg Oral 4 times per day   Or  . acetaminophen (TYLENOL) oral liquid 160 mg/5 mL  1,000 mg Per Tube 4 times per day  . amiodarone  200 mg Oral BID  . aspirin EC  325 mg Oral Daily   Or  . aspirin  324 mg Per Tube Daily  . bisacodyl  10 mg Oral Daily   Or  . bisacodyl  10 mg Rectal Daily  . cholecalciferol  1,000 Units Oral Daily  . docusate sodium  200 mg Oral Daily  . enoxaparin (LOVENOX) injection  30 mg Subcutaneous Q24H  . furosemide  40 mg Oral Daily  . guaiFENesin  600 mg Oral BID  . ipratropium-albuterol  3 mL Inhalation TID  . latanoprost  1 drop Both Eyes Daily  . loratadine  10 mg Oral Daily  . metoprolol tartrate  25 mg Oral BID  . montelukast  10 mg Oral QHS  . multivitamin with minerals  1 tablet Oral Daily  . pantoprazole  40 mg Oral Daily  . potassium chloride  20 mEq Oral Daily  . sodium chloride  3 mL Intravenous Q12H  . sodium chloride  3 mL Intravenous Q12H    OBJECTIVE  Filed Vitals:   04/04/15 2050 04/04/15 2143 04/05/15 0456 04/05/15 0607  BP:  133/73 105/60   Pulse:  56 50   Temp:   97.7 F (36.5 C)   TempSrc:   Oral   Resp:   17   Height:      Weight:   183 lb 11.2 oz (83.326 kg)   SpO2: 97%  97% 98%    Intake/Output Summary (Last 24 hours) at 04/05/15 0819 Last data filed at 04/05/15 0128  Gross per 24 hour  Intake    710 ml  Output    650 ml  Net     60 ml   Filed Weights   04/03/15 0500 04/04/15 0345 04/05/15 0456  Weight: 187 lb 13.3 oz (85.2 kg) 183 lb 9.6 oz (83.28 kg) 183 lb 11.2 oz (83.326 kg)    PHYSICAL EXAM  General: Pleasant, NAD. Neuro: Alert and oriented X 3. Moves all extremities  spontaneously. Psych: Normal affect. HEENT:  Normal  Neck: Supple without bruits or JVD. Lungs:  Resp regular and unlabored, rales at the basis Heart: RRR no s3, s4, or murmurs. Abdomen: Soft, non-tender, non-distended, BS + x 4.  Extremities: No clubbing, cyanosis or mild B/L edema. DP/PT/Radials 2+ and equal bilaterally.  Accessory Clinical Findings  CBC  Recent Labs  04/03/15 0420 04/04/15 0350  WBC 9.8 8.6  HGB 10.0* 10.3*  HCT 29.7* 30.6*  MCV 83.7 83.6  PLT 153 166   Basic Metabolic Panel  Recent Labs  04/02/15 1649  04/03/15 1608 04/04/15 0350  NA  --   < > 134* 137  K  --   < > 5.8* 3.8  CL  --   < > 99* 103  CO2  --   < > 26 24  GLUCOSE  --   < > 214* 123*  BUN  --   < > 11 12  CREATININE  0.89  < > 1.03* 0.95  CALCIUM  --   < > 8.4* 8.7*  MG 2.0  --   --   --   < > = values in this interval not displayed.  Radiology/Studies  Dg Chest 2 View  04/04/2015   CLINICAL DATA:  CABG.  EXAM: CHEST  2 VIEW  COMPARISON:  04/03/2015 .  FINDINGS: Mediastinum hilar structures normal. Prior median sternotomy. Stable cardiomegaly. Normal pulmonary vascularity. Low lung volumes with mild bibasilar atelectasis and/or infiltrates. Small pleural effusions cannot be excluded. No significant interim change. No pneumothorax. No acute osseous abnormality.  IMPRESSION: 1. Interim removal of right IJ sheath. 2. Low lung volumes with stable bibasilar atelectasis and/or infiltrates. Small pleural effusions again noted. 3. No evidence of infiltrate noted in the right upper lobe on today's exam. 4. Prior CABG. Cardiomegaly is stable. No pulmonary venous congestion .   Electronically Signed   By: Marcello Moores  Register   On: 04/04/2015 07:34   TELE: SR, intermittent pacing    ASSESSMENT AND PLAN:   79 yo s/p cardiac surgery with myxoma resection and LIMA-LAD developed post-op atrial fibrillation.   1. Atrial fibrillation: Paroxysmal. She had initial episode in 3/14 post-op ankle surgery.  She was not on anticoagulation prior to her current surgery. She has again developed atrial fibrillation with RVR.  Now in SR. Completed IV amiodarone and restart drip to control rate, now on Amiodarone 200 mg po BID.   - Given recurrent atrial fibrillation, would recommend starting her on Eliquis 5 mg bid. She has had oversewing of the LA appendage, but no definitive evidence that this is as effective as anticoagulation and she has no contraindication to anticoagulation. She can start Eliquis when deemed stable by surgery.   2. Acute on chronic diastolic CHF: Mild volume overload post-op. She was started today on po Lasix, I woulkd increase lasix to 40 mg po BID. Follow I/Os and weight.  3. CAD: s/p LIMA-LAD at time of myxoma resection. Would add statin to her regimen prior to discharge.    Signed, Dorothy Spark MD, [facility] 04/05/2015

## 2015-04-06 MED ORDER — ATORVASTATIN CALCIUM 10 MG PO TABS
10.0000 mg | ORAL_TABLET | Freq: Every day | ORAL | Status: DC
  Administered 2015-04-06 – 2015-04-09 (×4): 10 mg via ORAL
  Filled 2015-04-06 (×5): qty 1

## 2015-04-06 MED ORDER — COUMADIN BOOK
Freq: Once | Status: AC
  Administered 2015-04-06: 09:00:00
  Filled 2015-04-06: qty 1

## 2015-04-06 MED ORDER — HEPARIN (PORCINE) IN NACL 100-0.45 UNIT/ML-% IJ SOLN
1100.0000 [IU]/h | INTRAMUSCULAR | Status: DC
  Administered 2015-04-06: 1100 [IU]/h via INTRAVENOUS
  Filled 2015-04-06: qty 250

## 2015-04-06 MED ORDER — WARFARIN SODIUM 5 MG PO TABS
5.0000 mg | ORAL_TABLET | Freq: Once | ORAL | Status: AC
  Administered 2015-04-06: 5 mg via ORAL
  Filled 2015-04-06: qty 1

## 2015-04-06 MED ORDER — FUROSEMIDE 40 MG PO TABS
40.0000 mg | ORAL_TABLET | Freq: Every day | ORAL | Status: DC
  Administered 2015-04-07 – 2015-04-10 (×4): 40 mg via ORAL
  Filled 2015-04-06 (×4): qty 1

## 2015-04-06 MED ORDER — AMIODARONE HCL IN DEXTROSE 360-4.14 MG/200ML-% IV SOLN
60.0000 mg/h | INTRAVENOUS | Status: AC
  Administered 2015-04-06: 60 mg/h via INTRAVENOUS
  Filled 2015-04-06 (×4): qty 200

## 2015-04-06 MED ORDER — AMIODARONE HCL IN DEXTROSE 360-4.14 MG/200ML-% IV SOLN
30.0000 mg/h | INTRAVENOUS | Status: DC
  Administered 2015-04-07 (×2): 30 mg/h via INTRAVENOUS
  Filled 2015-04-06 (×8): qty 200

## 2015-04-06 MED ORDER — WARFARIN VIDEO
Freq: Once | Status: AC
  Administered 2015-04-06: 09:00:00

## 2015-04-06 MED ORDER — AMIODARONE LOAD VIA INFUSION
150.0000 mg | Freq: Once | INTRAVENOUS | Status: AC
Start: 2015-04-06 — End: 2015-04-06
  Administered 2015-04-06: 150 mg via INTRAVENOUS
  Filled 2015-04-06: qty 83.34

## 2015-04-06 MED ORDER — WARFARIN - PHARMACIST DOSING INPATIENT
Freq: Every day | Status: DC
  Administered 2015-04-08 – 2015-04-09 (×2)

## 2015-04-06 MED ORDER — ZOLPIDEM TARTRATE 5 MG PO TABS
5.0000 mg | ORAL_TABLET | Freq: Every evening | ORAL | Status: DC | PRN
  Administered 2015-04-06 – 2015-04-09 (×4): 5 mg via ORAL
  Filled 2015-04-06 (×4): qty 1

## 2015-04-06 NOTE — Progress Notes (Signed)
Late note:  Pt converted to afib approximately 14:30 on 04/05/15 after ambulation.  Morning dose of metoprolol held due to sinus bradicardia.  PO dose of 25 mg metoprolol given at 15:01 due to sustained atrial fib in 110's-120's.  Called PA Suzzanne Cloud at 1700 to discuss options as rate did not meet criteria for IV metoprolol and too early to give PO metoprolol again.  Decided not to give PO amiodarone early or PO metoprolol.  Gave 2.5 mg metoprolol IV at 17:35.  Pt rate/rhythm continued to be 110'-120's atrial.  RN Darryl made aware of parameters and rhythm. Will continue to monitor. Payton Emerald, RN

## 2015-04-06 NOTE — Progress Notes (Addendum)
Patient Name: Valerie Rollins Date of Encounter: 04/06/2015  Active Problems:   S/P CABG x 1   Length of Stay: 5  SUBJECTIVE  The patient is back in a-fib that started yesterday at 3 pm, her SOB has improved overnight as she diuresed > 2 L of fluid however feels restless.  CURRENT MEDS . acetaminophen  1,000 mg Oral 4 times per day   Or  . acetaminophen (TYLENOL) oral liquid 160 mg/5 mL  1,000 mg Per Tube 4 times per day  . amiodarone  200 mg Oral BID  . aspirin EC  325 mg Oral Daily   Or  . aspirin  324 mg Per Tube Daily  . bisacodyl  10 mg Oral Daily   Or  . bisacodyl  10 mg Rectal Daily  . cholecalciferol  1,000 Units Oral Daily  . docusate sodium  200 mg Oral Daily  . enoxaparin (LOVENOX) injection  30 mg Subcutaneous Q24H  . furosemide  40 mg Oral BID  . guaiFENesin  600 mg Oral BID  . ipratropium-albuterol  3 mL Inhalation TID  . latanoprost  1 drop Both Eyes Daily  . loratadine  10 mg Oral Daily  . metoprolol tartrate  25 mg Oral BID  . montelukast  10 mg Oral QHS  . multivitamin with minerals  1 tablet Oral Daily  . pantoprazole  40 mg Oral Daily  . potassium chloride  20 mEq Oral Daily  . sodium chloride  3 mL Intravenous Q12H  . sodium chloride  3 mL Intravenous Q12H   . sodium chloride Stopped (04/02/15 0900)  . amiodarone Stopped (04/04/15 1230)  . lactated ringers Stopped (04/03/15 1300)    OBJECTIVE  Filed Vitals:   04/05/15 1501 04/05/15 2008 04/05/15 2046 04/06/15 0449  BP:  116/94  113/79  Pulse: 126 130  119  Temp:  98.3 F (36.8 C)  98.1 F (36.7 C)  TempSrc:  Oral  Oral  Resp:  16  16  Height:      Weight:    179 lb 0.2 oz (81.2 kg)  SpO2:  96% 96% 97%    Intake/Output Summary (Last 24 hours) at 04/06/15 0738 Last data filed at 04/06/15 0453  Gross per 24 hour  Intake    240 ml  Output   2851 ml  Net  -2611 ml   Filed Weights   04/04/15 0345 04/05/15 0456 04/06/15 0449  Weight: 183 lb 9.6 oz (83.28 kg) 183 lb 11.2 oz (83.326  kg) 179 lb 0.2 oz (81.2 kg)    PHYSICAL EXAM  General: Pleasant, NAD. Neuro: Alert and oriented X 3. Moves all extremities spontaneously. Psych: Normal affect. HEENT:  Normal  Neck: Supple without bruits or JVD. Lungs:  Resp regular and unlabored, rales at the basis Heart: RRR no s3, s4, or murmurs. Abdomen: Soft, non-tender, non-distended, BS + x 4.  Extremities: No clubbing, cyanosis or mild B/L edema. DP/PT/Radials 2+ and equal bilaterally.  Accessory Clinical Findings  CBC  Recent Labs  04/04/15 0350  WBC 8.6  HGB 10.3*  HCT 30.6*  MCV 83.6  PLT 833   Basic Metabolic Panel  Recent Labs  04/03/15 1608 04/04/15 0350  NA 134* 137  K 5.8* 3.8  CL 99* 103  CO2 26 24  GLUCOSE 214* 123*  BUN 11 12  CREATININE 1.03* 0.95  CALCIUM 8.4* 8.7*    Radiology/Studies  Dg Chest 2 View  04/04/2015   CLINICAL DATA:  CABG.  EXAM: CHEST  2 VIEW  COMPARISON:  04/03/2015 .  FINDINGS: Mediastinum hilar structures normal. Prior median sternotomy. Stable cardiomegaly. Normal pulmonary vascularity. Low lung volumes with mild bibasilar atelectasis and/or infiltrates. Small pleural effusions cannot be excluded. No significant interim change. No pneumothorax. No acute osseous abnormality.  IMPRESSION: 1. Interim removal of right IJ sheath. 2. Low lung volumes with stable bibasilar atelectasis and/or infiltrates. Small pleural effusions again noted. 3. No evidence of infiltrate noted in the right upper lobe on today's exam. 4. Prior CABG. Cardiomegaly is stable. No pulmonary venous congestion .   Electronically Signed   By: Marcello Moores  Register   On: 04/04/2015 07:34   TELE: SR, intermittent pacing    ASSESSMENT AND PLAN:   79 yo s/p cardiac surgery with myxoma resection and LIMA-LAD developed post-op atrial fibrillation.   1. Atrial fibrillation: Paroxysmal. She had initial episode in 3/14 post-op ankle surgery. She was not on anticoagulation prior to her current surgery. She has  again developed atrial fibrillation with RVR.  She is back in a-fib that started at 3 pm yesterday - rebolus iv amiodarone, then follow with Amiodarone 200 mg po BID.   - Given recurrent atrial fibrillation, would recommend starting her on warfarin and bridging with heparin. She has had oversewing of the LA appendage, but no definitive evidence that this is as effective as anticoagulation and she has no contraindication to anticoagulation. She can start Eliquis when deemed stable by surgery.    2. Acute on chronic diastolic CHF: Mild volume overload post-op. She was started today on po Lasix, great response to lasix to 40 mg po BID with > 2 L diuresis in 24 hours, I would decrease lasix to 40 mg po daily. Follow I/Os and weight.   3. CAD: s/p LIMA-LAD at time of myxoma resection. Would add statin to her regimen prior to discharge.    Signed, Dorothy Spark MD, [facility] 04/06/2015

## 2015-04-06 NOTE — Progress Notes (Signed)
Patient delayed in  getting amiodarone bolus and start of drip.  Due to work list for rate change at 13:45 based on original start time, rate changed to 30 mg/hr at 14:00.  Rate should have been changed at 15:40.  I called pharmacy and discussed returning rate to 60 mg/hr for additional two hours and then resume 30 mg/hr.  Unable to document on MAR to reflect this change. Rate changed to 60 mg/hr at  17:20.  Pt resting with call bell within reach.  Will continue to monitor.  Payton Emerald, RN

## 2015-04-06 NOTE — Progress Notes (Signed)
Patient ambulated approximately 300 feet in hallway with front wheel walker. No SOB or stopping for rest. Patient returned to room and went to bed for nap. Pt resting with call bell within reach.  Will continue to monitor. Payton Emerald, RN

## 2015-04-06 NOTE — Discharge Instructions (Addendum)
Information on my medicine - Coumadin   (Warfarin)  This medication education was reviewed with me or my healthcare representative as part of my discharge preparation.  The pharmacist that spoke with me during my hospital stay was:  Dareen Piano, [facility]  Why was Coumadin prescribed for you? Coumadin was prescribed for you because you have a blood clot or a medical condition that can cause an increased risk of forming blood clots. Blood clots can cause serious health problems by blocking the flow of blood to the heart, lung, or brain. Coumadin can prevent harmful blood clots from forming. As a reminder your indication for Coumadin is:   Stroke Prevention Because Of Atrial Fibrillation  What test will check on my response to Coumadin? While on Coumadin (warfarin) you will need to have an INR test regularly to ensure that your dose is keeping you in the desired range. The INR (international normalized ratio) number is calculated from the result of the laboratory test called prothrombin time (PT).  If an INR APPOINTMENT HAS NOT ALREADY BEEN MADE FOR YOU please schedule an appointment to have this lab work done by your health care provider within 7 days. Your INR goal is usually a number between:  2 to 3 or your provider may give you a more narrow range like 2-2.5.  Ask your health care provider during an office visit what your goal INR is.  What  do you need to  know  About  COUMADIN? Take Coumadin (warfarin) exactly as prescribed by your healthcare provider about the same time each day.  DO NOT stop taking without talking to the doctor who prescribed the medication.  Stopping without other blood clot prevention medication to take the place of Coumadin may increase your risk of developing a new clot or stroke.  Get refills before you run out.  What do you do if you miss a dose? If you miss a dose, take it as soon as you remember on the same day then continue your regularly scheduled regimen the  next day.  Do not take two doses of Coumadin at the same time.  Important Safety Information A possible side effect of Coumadin (Warfarin) is an increased risk of bleeding. You should call your healthcare provider right away if you experience any of the following: ? Bleeding from an injury or your nose that does not stop. ? Unusual colored urine (red or dark brown) or unusual colored stools (red or black). ? Unusual bruising for unknown reasons. ? A serious fall or if you hit your head (even if there is no bleeding).  Some foods or medicines interact with Coumadin (warfarin) and might alter your response to warfarin. To help avoid this: ? Eat a balanced diet, maintaining a consistent amount of Vitamin K. ? Notify your provider about major diet changes you plan to make. ? Avoid alcohol or limit your intake to 1 drink for women and 2 drinks for men per day. (1 drink is 5 oz. wine, 12 oz. beer, or 1.5 oz. liquor.)  Make sure that ANY health care provider who prescribes medication for you knows that you are taking Coumadin (warfarin).  Also make sure the healthcare provider who is monitoring your Coumadin knows when you have started a new medication including herbals and non-prescription products.  Coumadin (Warfarin)  Major Drug Interactions  Increased Warfarin Effect Decreased Warfarin Effect  Alcohol (large quantities) Antibiotics (esp. Septra/Bactrim, Flagyl, Cipro) Amiodarone (Cordarone) Aspirin (ASA) Cimetidine (Tagamet) Megestrol (Megace) NSAIDs (ibuprofen,  naproxen, etc.) Piroxicam (Feldene) Propafenone (Rythmol SR) Propranolol (Inderal) Isoniazid (INH) Posaconazole (Noxafil) Barbiturates (Phenobarbital) Carbamazepine (Tegretol) Chlordiazepoxide (Librium) Cholestyramine (Questran) Griseofulvin Oral Contraceptives Rifampin Sucralfate (Carafate) Vitamin K   Coumadin (Warfarin) Major Herbal Interactions  Increased Warfarin Effect Decreased Warfarin Effect   Garlic Ginseng Ginkgo biloba Coenzyme Q10 Green tea St. Johns wort    Coumadin (Warfarin) FOOD Interactions  Eat a consistent number of servings per week of foods HIGH in Vitamin K (1 serving =  cup)  Collards (cooked, or boiled & drained) Kale (cooked, or boiled & drained) Mustard greens (cooked, or boiled & drained) Parsley *serving size only =  cup Spinach (cooked, or boiled & drained) Swiss chard (cooked, or boiled & drained) Turnip greens (cooked, or boiled & drained)  Eat a consistent number of servings per week of foods MEDIUM-HIGH in Vitamin K (1 serving = 1 cup)  Asparagus (cooked, or boiled & drained) Broccoli (cooked, boiled & drained, or raw & chopped) Brussel sprouts (cooked, or boiled & drained) *serving size only =  cup Lettuce, raw (green leaf, endive, romaine) Spinach, raw Turnip greens, raw & chopped   These websites have more information on Coumadin (warfarin):  FailFactory.se; VeganReport.com.au;   Activity: 1.May walk up steps                2.No lifting more than ten pounds for four weeks.                 3.No driving for four weeks.                4.Stop any activity that causes chest pain, shortness of breath, dizziness,  sweating or excessive weakness.                5.Avoid straining.                6.Continue with your breathing exercises daily.  Diet: Diabetic diet and Low fat, Low salt diet  Wound Care: May shower.  Clean wounds with mild soap and water daily. Contact the office at [phone] if any problems arise.

## 2015-04-06 NOTE — Progress Notes (Addendum)
       [address]       [place] [address]             [phone]          5 Days Post-Op Procedure(s) (LRB): CORONARY ARTERY BYPASS GRAFTING (CABG), ON PUMP, TIMES ONE, USING LEFT INTERNAL MAMMARY ARTERY (N/A) EXCISION OF ATRIAL MYXOMA (N/A) TRANSESOPHAGEAL ECHOCARDIOGRAM (TEE) (N/A)  Subjective: "Out of breath" this am.  Recurrent AF yesterday, still tachy around 130 this am.   Objective: Vital signs in last 24 hours: Patient Vitals for the past 24 hrs:  BP Temp Temp src Pulse Resp SpO2 Weight  04/06/15 0449 113/79 mmHg 98.1 F (36.7 C) Oral (!) 119 16 97 % 179 lb 0.2 oz (81.2 kg)  04/05/15 2046 - - - - - 96 % -  04/05/15 2008 (!) 116/94 mmHg 98.3 F (36.8 C) Oral (!) 130 16 96 % -  04/05/15 1501 - - - (!) 126 - - -  04/05/15 1349 - - - - - 94 % -  04/05/15 1308 (!) 162/71 mmHg 98.4 F (36.9 C) Oral (!) 59 16 98 % -  04/05/15 1000 - - - (!) 59 - - -  04/05/15 0911 - - - - - 98 % -   Current Weight  04/06/15 179 lb 0.2 oz (81.2 kg)  Pre-op weight = 84 kg   Intake/Output from previous day: 05/14 0701 - 05/15 0700 In: 240 [P.O.:240] Out: 2851 [Urine:2850; Stool:1]    PHYSICAL EXAM:  Heart: Irr irr Lungs: Diminished BS bilateral bases Wound: Clean and dry Extremities: Mild LE edema    Lab Results: CBC: Recent Labs  04/04/15 0350  WBC 8.6  HGB 10.3*  HCT 30.6*  PLT 165   BMET:  Recent Labs  04/03/15 1608 04/04/15 0350  NA 134* 137  K 5.8* 3.8  CL 99* 103  CO2 26 24  GLUCOSE 214* 123*  BUN 11 12  CREATININE 1.03* 0.95  CALCIUM 8.4* 8.7*    PT/INR: No results for input(s): LABPROT, INR in the last 72 hours.    Assessment/Plan: S/P Procedure(s) (LRB): CORONARY ARTERY BYPASS GRAFTING (CABG), ON PUMP, TIMES ONE, USING LEFT INTERNAL MAMMARY ARTERY (N/A) EXCISION OF ATRIAL MYXOMA (N/A) TRANSESOPHAGEAL ECHOCARDIOGRAM (TEE) (N/A)  CV- Recurrent AF.  Back on IV Amio per cardiology, continue po Lopressor.  Since she has  had recurrent AF, will plan to start anticoagulation, and Dr. Prescott Gum recommends Coumadin rather than Eliquis.  Pulm- continue pulm toilet, IS.  Vol overload- Continue diuresis. Lasix switched to po today.  CRPI.   LOS: 5 days    COLLINS,GINA H 04/06/2015   Chart reviewed, patient examined, agree with above. Started on IV heparin this am by cardiology and coumadin. She is POD 5 and it is too soon to start IV heparin due to the risk of bleeding. I was not called about starting heparin. Coumadin to start tonight.

## 2015-04-06 NOTE — Consult Note (Signed)
ANTICOAGULATION CONSULT NOTE - Initial Consult  Pharmacy Consult for Heparin and Coumadin Indication: atrial fibrillation  Allergies  Allergen Reactions  . Oxybutynin Chloride     Tongue swelling; Rx from Dr Hessie Diener  . Propoxyphene N-Acetaminophen     REACTION: made pt blood to thin  . Alendronate Sodium     ? reaction  . Ramipril     REACTION: cough    Patient Measurements: Height: 5' 5.5" (166.4 cm) Weight: 179 lb 0.2 oz (81.2 kg) IBW/kg (Calculated) : 58.15 Heparin Dosing Weight: 75kg  Vital Signs: Temp: 98.1 F (36.7 C) (05/15 0449) Temp Source: Oral (05/15 0449) BP: 113/79 mmHg (05/15 0449) Pulse Rate: 119 (05/15 0449)  Labs:  Recent Labs  04/03/15 1608 04/04/15 0350  HGB  --  10.3*  HCT  --  30.6*  PLT  --  165  CREATININE 1.03* 0.95    Estimated Creatinine Clearance: 53.6 mL/min (by C-G formula based on Cr of 0.95).   Medical History: Past Medical History  Diagnosis Date  . Diverticulitis 2002    based on CT scan  . Hyperlipidemia   . Hypertension   . Superficial phlebitis      X 2, 1999, 2000  . Ocular hypertension     glaucoma suspect, Dr. Kathrin Penner  . Skin cancer   . Endometriosis   . Gastritis 2006    @ Endo  . Hiatal hernia   . Diverticulosis     Dr Henrene Pastor  . GERD (gastroesophageal reflux disease)   . DVT (deep venous thrombosis) 2000    no trigger  . Syncope 11/12/11    in context CAP . Carotid Doppler exam was negative. 2D echocardiogram revealed mild dilation of the left atrium and moderate increase in the systolic pressureof  the pulmonary artery  . Atrial fibrillation     Post op after repair of ankle fracture  . UTI (lower urinary tract infection) 01/2013    Strep bovis ; PCN sensitive  . IBS (irritable bowel syndrome)    Assessment: 78yof POD#5 CABG x1 and excision of atrial myoma now with recurrent afib to begin coumadin with heparin bridge. Last dose of prophylactic lovenox 30mg  given 5/14 at 1049. CBC is stable.  Goal  of Therapy:  Heparin level 0.3-0.7 units/ml Monitor platelets by anticoagulation protocol: Yes   Plan:  1) No bolus given recent surgery 2) Begin heparin at 1100 units/hr 3) Check 8 hour heparin level 4) Coumadin 5mg  x 1 5) Daily INR, heparin level, CBC 6) Coumadin education - book and video  Deboraha Sprang 04/06/2015,8:28 AM

## 2015-04-07 LAB — CBC
HCT: 30.1 % — ABNORMAL LOW (ref 36.0–46.0)
Hemoglobin: 10 g/dL — ABNORMAL LOW (ref 12.0–15.0)
MCH: 28.4 pg (ref 26.0–34.0)
MCHC: 33.2 g/dL (ref 30.0–36.0)
MCV: 85.5 fL (ref 78.0–100.0)
Platelets: 297 10*3/uL (ref 150–400)
RBC: 3.52 MIL/uL — ABNORMAL LOW (ref 3.87–5.11)
RDW: 13.9 % (ref 11.5–15.5)
WBC: 7.4 10*3/uL (ref 4.0–10.5)

## 2015-04-07 LAB — BASIC METABOLIC PANEL
ANION GAP: 9 (ref 5–15)
BUN: 16 mg/dL (ref 6–20)
CHLORIDE: 105 mmol/L (ref 101–111)
CO2: 25 mmol/L (ref 22–32)
Calcium: 9.1 mg/dL (ref 8.9–10.3)
Creatinine, Ser: 0.89 mg/dL (ref 0.44–1.00)
GFR calc non Af Amer: >60 mL/min (ref >60)
Glucose, Bld: 90 mg/dL (ref 65–99)
POTASSIUM: 4.4 mmol/L (ref 3.5–5.1)
SODIUM: 139 mmol/L (ref 135–145)

## 2015-04-07 LAB — PROTIME-INR
INR: 1.13 (ref 0.00–1.49)
Prothrombin Time: 14.7 seconds (ref 11.6–15.2)

## 2015-04-07 MED ORDER — WARFARIN SODIUM 5 MG PO TABS
5.0000 mg | ORAL_TABLET | Freq: Once | ORAL | Status: AC
  Administered 2015-04-07: 5 mg via ORAL
  Filled 2015-04-07: qty 1

## 2015-04-07 MED ORDER — METOPROLOL TARTRATE 25 MG PO TABS
37.5000 mg | ORAL_TABLET | Freq: Two times a day (BID) | ORAL | Status: DC
  Administered 2015-04-07 – 2015-04-10 (×7): 37.5 mg via ORAL
  Filled 2015-04-07 (×8): qty 1

## 2015-04-07 NOTE — Consult Note (Signed)
ANTICOAGULATION CONSULT NOTE - Initial Consult  Pharmacy Consult for Heparin and Coumadin Indication: atrial fibrillation  Allergies  Allergen Reactions  . Oxybutynin Chloride     Tongue swelling; Rx from Dr Hessie Diener  . Propoxyphene N-Acetaminophen     REACTION: made pt blood to thin  . Alendronate Sodium     ? reaction  . Ramipril     REACTION: cough    Patient Measurements: Height: 5' 5.5" (166.4 cm) Weight: 179 lb 11.2 oz (81.511 kg) IBW/kg (Calculated) : 58.15 Heparin Dosing Weight: 75kg  Vital Signs: Temp: 97.7 F (36.5 C) (05/16 0441) Temp Source: Oral (05/16 0441) BP: 126/83 mmHg (05/16 0441) Pulse Rate: 124 (05/16 0441)  Labs:  Recent Labs  04/07/15 0320  HGB 10.0*  HCT 30.1*  PLT 297  LABPROT 14.7  INR 1.13  CREATININE 0.89    Estimated Creatinine Clearance: 57.3 mL/min (by C-G formula based on Cr of 0.89).   Medical History: Past Medical History  Diagnosis Date  . Diverticulitis 2002    based on CT scan  . Hyperlipidemia   . Hypertension   . Superficial phlebitis      X 2, 1999, 2000  . Ocular hypertension     glaucoma suspect, Dr. Kathrin Penner  . Skin cancer   . Endometriosis   . Gastritis 2006    @ Endo  . Hiatal hernia   . Diverticulosis     Dr Henrene Pastor  . GERD (gastroesophageal reflux disease)   . DVT (deep venous thrombosis) 2000    no trigger  . Syncope 11/12/11    in context CAP . Carotid Doppler exam was negative. 2D echocardiogram revealed mild dilation of the left atrium and moderate increase in the systolic pressureof  the pulmonary artery  . Atrial fibrillation     Post op after repair of ankle fracture  . UTI (lower urinary tract infection) 01/2013    Strep bovis ; PCN sensitive  . IBS (irritable bowel syndrome)    Assessment: Valerie Rollins POD#6 CABG x1 and excision of atrial myoma now with recurrent afib, started coumadin last night. INR 1.13 this morning after one dose of 5mg . CBC is stable. Remains on IV amiodarone.   Goal of  Therapy:  INR = 2 - 3 Monitor platelets by anticoagulation protocol: Yes   Plan:  - Coumadin 5mg  x 1 - Daily INR, heparin level, CBC  Maryanna Shape, PharmD, BCPS  Clinical Pharmacist  Pager: [phone]   04/07/2015,10:46 AM

## 2015-04-07 NOTE — Progress Notes (Signed)
       Patient Name: Valerie Rollins Date of Encounter: 04/07/2015    SUBJECTIVE: Still tachycardic and with some dyspnea. "not breathing as well as I'd like".  TELEMETRY:  AF with RVR Filed Vitals:   04/06/15 1212 04/06/15 1645 04/06/15 2052 04/07/15 0441  BP:  131/86  126/83  Pulse: 120 100 110 124  Temp:  98.4 F (36.9 C)  97.7 F (36.5 C)  TempSrc:  Oral  Oral  Resp:  16 18 18   Height:      Weight:    179 lb 11.2 oz (81.511 kg)  SpO2:  97% 96% 96%    Intake/Output Summary (Last 24 hours) at 04/07/15 0853 Last data filed at 04/07/15 0730  Gross per 24 hour  Intake    720 ml  Output   1150 ml  Net   -430 ml   LABS: Basic Metabolic Panel:  Recent Labs  04/07/15 0320  NA 139  K 4.4  CL 105  CO2 25  GLUCOSE 90  BUN 16  CREATININE 0.89  CALCIUM 9.1   CBC:  Recent Labs  04/07/15 0320  WBC 7.4  HGB 10.0*  HCT 30.1*  MCV 85.5  PLT 297     Radiology/Studies:  No new.  Physical Exam: Blood pressure 126/83, pulse 124, temperature 97.7 F (36.5 C), temperature source Oral, resp. rate 18, height 5' 5.5" (1.664 m), weight 179 lb 11.2 oz (81.511 kg), SpO2 96 %. Weight change: 11 oz (0.311 kg)  Wt Readings from Last 3 Encounters:  04/07/15 179 lb 11.2 oz (81.511 kg)  03/31/15 178 lb 2.1 oz (80.8 kg)  03/28/15 178 lb (80.74 kg)  Chest with decreased basilar rales and breath sounds Cardiac with no rub or gallop No edema  ASSESSMENT:  1. S/P LA myxoma removal and CABG 2. PAF with persisting AF for 36 hours and poor rate control.  Plan:  1. IV amio 2. Coumadin as ordered. 3. Slightly up-titrate the metoprolol.  Demetrios Isaacs 04/07/2015, 8:53 AM

## 2015-04-07 NOTE — Progress Notes (Addendum)
      [address]       [place] [address]             [phone]      6 Days Post-Op Procedure(s) (LRB): CORONARY ARTERY BYPASS GRAFTING (CABG), ON PUMP, TIMES ONE, USING LEFT INTERNAL MAMMARY ARTERY (N/A) EXCISION OF ATRIAL MYXOMA (N/A) TRANSESOPHAGEAL ECHOCARDIOGRAM (TEE) (N/A)   Subjective:  Ms. Groom states she feels pretty good.  She does have some shortness of breath when she ambulates in the hallway.  + BM  Objective: Vital signs in last 24 hours: Temp:  [97.7 F (36.5 C)-98.4 F (36.9 C)] 97.7 F (36.5 C) (05/16 0441) Pulse Rate:  [100-124] 124 (05/16 0441) Cardiac Rhythm:  [-] Atrial fibrillation (05/15 2023) Resp:  [16-18] 18 (05/16 0441) BP: (126-131)/(83-86) 126/83 mmHg (05/16 0441) SpO2:  [96 %-97 %] 96 % (05/16 0441) Weight:  [179 lb 11.2 oz (81.511 kg)] 179 lb 11.2 oz (81.511 kg) (05/16 0441)  Intake/Output from previous day: 05/15 0701 - 05/16 0700 In: 480 [P.O.:480] Out: 750 [Urine:750]  General appearance: alert, cooperative and no distress Heart: irregularly irregular rhythm Lungs: clear to auscultation bilaterally Abdomen: soft, non-tender; bowel sounds normal; no masses,  no organomegaly Extremities: edema trace Wound: clean and dry  Lab Results:  Recent Labs  04/07/15 0320  WBC 7.4  HGB 10.0*  HCT 30.1*  PLT 297   BMET:  Recent Labs  04/07/15 0320  NA 139  K 4.4  CL 105  CO2 25  GLUCOSE 90  BUN 16  CREATININE 0.89  CALCIUM 9.1    PT/INR:  Recent Labs  04/07/15 0320  LABPROT 14.7  INR 1.13   ABG    Component Value Date/Time   PHART 7.347* 04/02/2015 0006   HCO3 20.8 04/02/2015 0006   TCO2 20 04/02/2015 1651   ACIDBASEDEF 4.0* 04/02/2015 0006   O2SAT 95.0 04/02/2015 0006   CBG (last 3)  No results for input(s): GLUCAP in the last 72 hours.  Assessment/Plan: S/P Procedure(s) (LRB): CORONARY ARTERY BYPASS GRAFTING (CABG), ON PUMP, TIMES ONE, USING LEFT INTERNAL MAMMARY ARTERY (N/A) EXCISION OF  ATRIAL MYXOMA (N/A) TRANSESOPHAGEAL ECHOCARDIOGRAM (TEE) (N/A)  1. Cv- Atrial fibrillation- on Amiodarone, Lopressor 2. INR 1.13, continue Coumadin 3. Pulm- off oxygen, continue IS 4. Renal-creatinine WNL, weight is at baseline, continue Lasix 5. Dispo- patient stable, continue Coumadin for A. Fib, EPW out tomorrow, likely home middle to end of the week   LOS: 6 days    Ahmed Prima, ERIN 04/07/2015  Agree with above assessment and plan Plan on discharge home on Coumadin and amiodarone and beta blocker

## 2015-04-07 NOTE — Progress Notes (Signed)
Physical Therapy Treatment Patient Details Name: Valerie Rollins MRN: [id number] DOB: 1936/11/04 Today's Date: Apr 22, 2015    History of Present Illness Adm 5/10 for CABG with excision or atrial myxoma PMHx- ankle ORIF 2012 (fell due to syncope)    PT Comments    Pt eager to walk this pm (and HR, dyspnea much improved). Did better with adhering to sternal precautions during transitional movements. Continued to require cues (and occasional physical assist) for proper use of RW.    Follow Up Recommendations  Home health PT;Supervision for mobility/OOB     Equipment Recommendations  None recommended by PT    Recommendations for Other Services OT consult     Precautions / Restrictions Precautions Precautions: Sternal Precaution Comments: no cues required to adhere to precautions    Mobility  Bed Mobility                  Transfers Overall transfer level: Needs assistance Equipment used: Rolling walker (2 wheeled) Transfers: Sit to/from Stand Sit to Stand: From elevated surface;Min assist (pillow in seat of recliner; )         General transfer comment: very slight assist to shift forward over her feet and elevate to stand  Ambulation/Gait Ambulation/Gait assistance: Min assist Ambulation Distance (Feet): 450 Feet Assistive device: Rolling walker (2 wheeled) Gait Pattern/deviations: Step-through pattern;Decreased stride length;Trunk flexed   Gait velocity interpretation: at or above normal speed for age/gender General Gait Details: vc and tactile cues to stay on straight path (drifting to her Rt repeatedly)   Stairs            Wheelchair Mobility    Modified Rankin (Stroke Patients Only)       Balance                                    Cognition Arousal/Alertness: Awake/alert Behavior During Therapy: WFL for tasks assessed/performed Overall Cognitive Status: Within Functional Limits for tasks assessed                       Exercises General Exercises - Lower Extremity Ankle Circles/Pumps: AROM;Both;10 reps    General Comments        Pertinent Vitals/Pain HR 64-71 SaO2 95% on room air (and pt able to talk while walking with mild dyspnea)  Pain Assessment: No/denies pain    Home Living                      Prior Function            PT Goals (current goals can now be found in the care plan section) Acute Rehab PT Goals Patient Stated Goal: return home with family assist Time For Goal Achievement: 04/10/15 Progress towards PT goals: Progressing toward goals    Frequency  Min 3X/week    PT Plan Current plan remains appropriate    Co-evaluation             End of Session   Activity Tolerance: Patient tolerated treatment well Patient left: in chair;with call bell/phone within reach     Time: 6803-2122 PT Time Calculation (min) (ACUTE ONLY): 24 min  Charges:  $Gait Training: 23-37 mins                    G Codes:      Britanie Harshman 2015-04-22, 4:01 PM Pager [phone]

## 2015-04-07 NOTE — Progress Notes (Signed)
Pt without complaints despite HR sustaining 125-140's.  5mg  IV lopressor given per PRN order.  External pacer DC'd and wires rolled and taped per verbal order from Dr Prescott Gum.  Husband at bedside, all questions answered, will con't plan of care.

## 2015-04-07 NOTE — Progress Notes (Signed)
Pt with 1 sec pause followed by reduction in baseline HR, now in the 60's tho still irregular.  BP stable, Pt states she "cant feel it yet."  Resting comfortably in bed husband at side.  Will con't plan of care.

## 2015-04-07 NOTE — Progress Notes (Signed)
Physical Therapy Treatment Patient Details Name: Valerie Rollins MRN: [id number] DOB: 05-Apr-1936 Today's Date: 04/07/2015    History of Present Illness Adm 5/10 for CABG with excision or atrial myxoma PMHx- ankle ORIF 2012 (fell due to syncope)    PT Comments    Pt remains in afib with HR 106-137 at rest with dyspnea. Pt requesting assist to bathroom. Reviewed sternal precautions with relation to transfers and pt required cues again when repeated (tends to reach back for armrests with stand to sit). Gait slow, steady to/from bathroom. Further gait deferred due to elevated HR and dyspnea (although SaO2 96% on RA). Will continue to follow for discharge needs and education re: sternal precautions with mobility.    Follow Up Recommendations  Home health PT;Supervision for mobility/OOB     Equipment Recommendations  None recommended by PT    Recommendations for Other Services OT consult     Precautions / Restrictions Precautions Precautions: Sternal Precaution Comments: vc when stand to sit to not use hands on armrests (required both times) Restrictions Weight Bearing Restrictions: No    Mobility  Bed Mobility                  Transfers Overall transfer level: Needs assistance Equipment used: None Transfers: Sit to/from Stand Sit to Stand: From elevated surface;Min assist (pillow in seat of recliner; BSC over toilet)         General transfer comment: pt not using UEs to stand, however required cues to not use UEs on armrests when sitting; pillow in recliner; assist with forward and upward translation  Ambulation/Gait Ambulation/Gait assistance: Min guard Ambulation Distance (Feet): 12 Feet (to toilet, 5 ft to sink) Assistive device: None (one hand on IV pole (due to small room;bathroom)) Gait Pattern/deviations: Step-through pattern;Decreased stride length Gait velocity: very slow due to incr HR and dyspnea       Stairs            Wheelchair Mobility     Modified Rankin (Stroke Patients Only)       Balance Overall balance assessment: No apparent balance deficits (not formally assessed)                                  Cognition Arousal/Alertness: Awake/alert Behavior During Therapy: WFL for tasks assessed/performed Overall Cognitive Status: Within Functional Limits for tasks assessed                      Exercises      General Comments        Pertinent Vitals/Pain Pain Assessment: No/denies pain    Home Living                      Prior Function            PT Goals (current goals can now be found in the care plan section) Acute Rehab PT Goals Patient Stated Goal: return home with family assist Time For Goal Achievement: 04/10/15 Progress towards PT goals: Progressing toward goals    Frequency  Min 3X/week    PT Plan Current plan remains appropriate    Co-evaluation             End of Session   Activity Tolerance: Patient limited by fatigue Patient left: in chair;with nursing/sitter in room (nurse tech in to assist with bath at sink; pt seated)     Time:  2130-8657 PT Time Calculation (min) (ACUTE ONLY): 18 min  Charges:  $Therapeutic Activity: 8-22 mins                    G Codes:      Kahner Yanik 05-May-2015, 9:09 AM Pager [phone]

## 2015-04-07 NOTE — Care Management (Signed)
Medicare Important Message given? Yes (If Response is "NO", the following Medicare IM given date fields will be blank) Date Medicare IM given: 04/07/15 Medicare IM given by: Yoana Staib 

## 2015-04-07 NOTE — Progress Notes (Deleted)
Pt to DC home with husband.  All instructions and prescriptions given and reviewed, all questions answered.

## 2015-04-07 NOTE — Progress Notes (Signed)
CARDIAC REHAB PHASE I   PRE:  Rate/Rhythm: 116-130s afib    BP: sitting 121/82    SaO2: 95 RA  MODE:  Ambulation: 700 ft   POST:  Rate/Rhythm: 140 afib    BP: sitting 140/89     SaO2: 96 RA  Pt is tired from many activities this am. Willing to walk. Steady with rollator (used in case she needed to sit). No major c/o besides being tired from no rest. HR 120s to 140. To bed after walk to access IV. Will f/u, encouraged x2 more walks after she rests today. [id number]   Josephina Shih Whitmer CES, ACSM 04/07/2015 11:13 AM

## 2015-04-08 DIAGNOSIS — D151 Benign neoplasm of heart: Principal | ICD-10-CM

## 2015-04-08 LAB — PROTIME-INR
INR: 1.18 (ref 0.00–1.49)
Prothrombin Time: 15.1 seconds (ref 11.6–15.2)

## 2015-04-08 MED ORDER — AMIODARONE HCL 200 MG PO TABS
400.0000 mg | ORAL_TABLET | Freq: Two times a day (BID) | ORAL | Status: DC
  Administered 2015-04-08 – 2015-04-09 (×4): 400 mg via ORAL
  Filled 2015-04-08 (×6): qty 2

## 2015-04-08 MED ORDER — LOSARTAN POTASSIUM 25 MG PO TABS
25.0000 mg | ORAL_TABLET | Freq: Every day | ORAL | Status: DC
  Administered 2015-04-08 – 2015-04-10 (×3): 25 mg via ORAL
  Filled 2015-04-08 (×3): qty 1

## 2015-04-08 MED ORDER — WARFARIN SODIUM 7.5 MG PO TABS
7.5000 mg | ORAL_TABLET | Freq: Once | ORAL | Status: AC
Start: 2015-04-08 — End: 2015-04-08
  Administered 2015-04-08: 7.5 mg via ORAL
  Filled 2015-04-08: qty 1

## 2015-04-08 NOTE — Progress Notes (Signed)
UR Completed. Gerardine Peltz, RN, BSN.  [phone] 

## 2015-04-08 NOTE — Consult Note (Signed)
ANTICOAGULATION CONSULT NOTE - Initial Consult  Pharmacy Consult for Heparin and Coumadin Indication: atrial fibrillation  Allergies  Allergen Reactions  . Oxybutynin Chloride     Tongue swelling; Rx from Dr Hessie Diener  . Propoxyphene N-Acetaminophen     REACTION: made pt blood to thin  . Alendronate Sodium     ? reaction  . Ramipril     REACTION: cough    Patient Measurements: Height: 5' 5.5" (166.4 cm) Weight: 180 lb 1.9 oz (81.7 kg) IBW/kg (Calculated) : 58.15 Heparin Dosing Weight: 75kg  Vital Signs: Temp: 97.9 F (36.6 C) (05/17 0343) Temp Source: Oral (05/17 0343) BP: 150/71 mmHg (05/17 0343) Pulse Rate: 68 (05/17 0343)  Labs:  Recent Labs  04/07/15 0320 04/08/15 0330  HGB 10.0*  --   HCT 30.1*  --   PLT 297  --   LABPROT 14.7 15.1  INR 1.13 1.18  CREATININE 0.89  --     Estimated Creatinine Clearance: 57.4 mL/min (by C-G formula based on Cr of 0.89).   Assessment: 78yof POD#7 CABG x1 and excision of atrial myoma now with post op afib, on coumadin. INR 1.18 this morning after 2 doses of 5mg . Amiodarone changed to po.   Goal of Therapy:  INR = 2 - 3 Monitor platelets by anticoagulation protocol: Yes   Plan:  - Coumadin 7.5mg  x 1 - Daily INR  Maryanna Shape, PharmD, BCPS  Clinical Pharmacist  Pager: [phone]   04/08/2015,9:37 AM

## 2015-04-08 NOTE — Progress Notes (Signed)
       Patient Name: Valerie Rollins Date of Encounter: 04/08/2015    SUBJECTIVE: No much sleep.  TELEMETRY:  Controlled rate but no definite P waves Filed Vitals:   04/07/15 1451 04/07/15 2034 04/07/15 2344 04/08/15 0343  BP:  127/61 124/66 150/71  Pulse:  65 62 68  Temp:  98.4 F (36.9 C)  97.9 F (36.6 C)  TempSrc:  Oral  Oral  Resp:    18  Height:      Weight:    180 lb 1.9 oz (81.7 kg)  SpO2: 93% 92%  98%    Intake/Output Summary (Last 24 hours) at 04/08/15 0829 Last data filed at 04/08/15 0700  Gross per 24 hour  Intake    720 ml  Output   2401 ml  Net  -1681 ml   LABS: Basic Metabolic Panel:  Recent Labs  04/07/15 0320  NA 139  K 4.4  CL 105  CO2 25  GLUCOSE 90  BUN 16  CREATININE 0.89  CALCIUM 9.1   CBC:  Recent Labs  04/07/15 0320  WBC 7.4  HGB 10.0*  HCT 30.1*  MCV 85.5  PLT 297     Radiology/Studies:  No new  Physical Exam: Blood pressure 150/71, pulse 68, temperature 97.9 F (36.6 C), temperature source Oral, resp. rate 18, height 5' 5.5" (1.664 m), weight 180 lb 1.9 oz (81.7 kg), SpO2 98 %. Weight change: 6.7 oz (0.189 kg)  Wt Readings from Last 3 Encounters:  04/08/15 180 lb 1.9 oz (81.7 kg)  03/31/15 178 lb 2.1 oz (80.8 kg)  03/28/15 178 lb (80.74 kg)    Chest is clear Cardiac without rub or gallop.  ASSESSMENT:  1. S/p CABG and resection LA myxoma 2. Post op AF with rate control.  Plan:  Convert amio to oral dosing.  Demetrios Isaacs 04/08/2015, 8:29 AM

## 2015-04-08 NOTE — Progress Notes (Addendum)
       [address]       [place] [address]             [phone]          7 Days Post-Op Procedure(s) (LRB): CORONARY ARTERY BYPASS GRAFTING (CABG), ON PUMP, TIMES ONE, USING LEFT INTERNAL MAMMARY ARTERY (N/A) EXCISION OF ATRIAL MYXOMA (N/A) TRANSESOPHAGEAL ECHOCARDIOGRAM (TEE) (N/A)  Subjective: Stable overnight, no complaints. Walked 3 times yesterday. Appetite good. Breathing stable.   Objective: Vital signs in last 24 hours: Patient Vitals for the past 24 hrs:  BP Temp Temp src Pulse Resp SpO2 Weight  04/08/15 0343 (!) 150/71 mmHg 97.9 F (36.6 C) Oral 68 18 98 % 180 lb 1.9 oz (81.7 kg)  04/07/15 2344 124/66 mmHg - - 62 - - -  04/07/15 2034 127/61 mmHg 98.4 F (36.9 C) Oral 65 - 92 % -  04/07/15 1451 - - - - - 93 % -  04/07/15 1422 134/68 mmHg 97.9 F (36.6 C) Oral 64 18 98 % -  04/07/15 1345 - - - (!) 56 - - -  04/07/15 0909 - - - - - 95 % -   Current Weight  04/08/15 180 lb 1.9 oz (81.7 kg)     Intake/Output from previous day: 05/16 0701 - 05/17 0700 In: 44 [P.O.:960] Out: 2801 [Urine:2800; Stool:1]    PHYSICAL EXAM:  Heart: RRR Lungs: Clear Wound: Clean and dry Extremities: Mild LE edema    Lab Results: CBC: Recent Labs  04/07/15 0320  WBC 7.4  HGB 10.0*  HCT 30.1*  PLT 297   BMET:  Recent Labs  04/07/15 0320  NA 139  K 4.4  CL 105  CO2 25  GLUCOSE 90  BUN 16  CREATININE 0.89  CALCIUM 9.1    PT/INR:  Recent Labs  04/08/15 0330  LABPROT 15.1  INR 1.18      Assessment/Plan: S/P Procedure(s) (LRB): CORONARY ARTERY BYPASS GRAFTING (CABG), ON PUMP, TIMES ONE, USING LEFT INTERNAL MAMMARY ARTERY (N/A) EXCISION OF ATRIAL MYXOMA (N/A) TRANSESOPHAGEAL ECHOCARDIOGRAM (TEE) (N/A)  CV- Rate controlled AF/ SR for the most part. BPs stable but trending up, may need to increase ARB. Will convert IV Amio to po, continue Metoprolol, Coumadin.   Vol overload- diurese.  CRPI, pulm toilet.  Hopefully home  soon if rhythm remains stable.   LOS: 7 days    COLLINS,GINA H 04/08/2015   Chart reviewed, patient examined, agree with above. INR has not bumped yet.

## 2015-04-08 NOTE — Progress Notes (Signed)
CARDIAC REHAB PHASE I   PRE:  Rate/Rhythm: 60 ?a.fib/SR  BP:  Sitting: 117/58        SaO2: 94 RA  MODE:  Ambulation: 450 ft   POST:  Rate/Rhythm: 71 ?a.fib/SR  BP:  Sitting: 121/59         SaO2: 98 RA  Pt ambulated 450 ft on RA, assist x1, rollator, IV, steady gait, tolerated well. Pt c/o mild DOE, improved since yesterday. Pt states she used a rolling walker with PT yesterday since that is the type of walker she has at home, but states she prefers the rollator. Pt HR controlled during ambulation. Pt to recliner after walk, feet elevated, call bell within reach, husband at bedside. Encouraged pt to ambulate as tolerated.   [phone]  [name], RN, BSN 04/08/2015 2:25 PM

## 2015-04-08 NOTE — Consult Note (Signed)
   [facility] CM Inpatient Consult   04/08/2015  Valerie Rollins September 15, 1936 [id number] Referral received to assess for care management services. Explained that [place] [facility] is a covered benefit of insurance to the patient and her husband regarding the [facility] services. Review information about [facility] Care Management.  Explained that [place] [facility] does not interfere with or replace any services arranged by the inpatient care management staff.  Patient declined services with [place] [facility] at this time.   She states, "We have two daughters that live within a mile of [place] but, of coarse, they work; I just think we will see how I do after I get home.  Right now, I am just so worn out with the therapy and I just need to see how I get along."  Husband request the brochure and contact information. Brochure given and encouraged to call anytime.  For questions, please contact: Natividad Brood, RN BSN [facility] Liaison  [phone] business mobile phone

## 2015-04-08 NOTE — Progress Notes (Signed)
Pt ambulated 328ft using walker with RN. Pt denied SOB or dizziness. Pt tolerated well and returned to chair in room with call bell within reach. RN will cont to monitor.

## 2015-04-09 LAB — PROTIME-INR
INR: 1.44 (ref 0.00–1.49)
Prothrombin Time: 17.7 seconds — ABNORMAL HIGH (ref 11.6–15.2)

## 2015-04-09 MED ORDER — WARFARIN SODIUM 7.5 MG PO TABS
7.5000 mg | ORAL_TABLET | Freq: Once | ORAL | Status: AC
  Administered 2015-04-09: 7.5 mg via ORAL
  Filled 2015-04-09: qty 1

## 2015-04-09 NOTE — Progress Notes (Signed)
Physical Therapy Treatment Patient Details Name: DESTANEY SARKIS MRN: [id number] DOB: 1936/03/31 Today's Date: 16-Apr-2015    History of Present Illness Adm 5/10 for CABG with excision or atrial myxoma PMHx- ankle ORIF 2012 (fell due to syncope)    PT Comments    Pt doing well with mobility. Not sure she wants to get another piece of equipment (rollator). She does fine with the rolling walker and will use this at home. If she decides she wants a rollator she will pursue on her own.  Follow Up Recommendations  Home health PT;Supervision for mobility/OOB     Equipment Recommendations  None recommended by PT    Recommendations for Other Services OT consult     Precautions / Restrictions Precautions Precautions: Sternal Precaution Comments: no cues required to adhere to precautions Restrictions Weight Bearing Restrictions: No    Mobility  Bed Mobility                  Transfers Overall transfer level: Needs assistance Equipment used: Rolling walker (2 wheeled) Transfers: Sit to/from Stand Sit to Stand: Min guard (pillow in seat of recliner; )         General transfer comment: No physical assistance needed to complete.  Ambulation/Gait Ambulation/Gait assistance: Supervision;Min guard Ambulation Distance (Feet): 400 Feet Assistive device: Rolling walker (2 wheeled);None Gait Pattern/deviations: Step-through pattern;Decreased stride length Gait velocity: slow Gait velocity interpretation: Below normal speed for age/gender General Gait Details: Steady gait with walker. Amb some without assistive device and needed min guard in hall and supervision in room.   Stairs            Wheelchair Mobility    Modified Rankin (Stroke Patients Only)       Balance Overall balance assessment: No apparent balance deficits (not formally assessed)                                  Cognition Arousal/Alertness: Awake/alert Behavior During Therapy: WFL for  tasks assessed/performed Overall Cognitive Status: Within Functional Limits for tasks assessed                      Exercises General Exercises - Lower Extremity Ankle Circles/Pumps: AROM;Both;10 reps    General Comments        Pertinent Vitals/Pain      Home Living                      Prior Function            PT Goals (current goals can now be found in the care plan section) Acute Rehab PT Goals Patient Stated Goal: return home with family assist Time For Goal Achievement: 04/10/15 Progress towards PT goals: Progressing toward goals    Frequency  Min 3X/week    PT Plan Current plan remains appropriate    Co-evaluation             End of Session   Activity Tolerance: Patient tolerated treatment well Patient left: in chair;with call bell/phone within reach;with family/visitor present     Time:  -     Charges:  $Gait Training: 8-22 mins                    G Codes:      Owais Pruett 04/16/2015, 9:56 AM  [facility] PT [phone]

## 2015-04-09 NOTE — Progress Notes (Addendum)
      [address]       [place] [address]             [phone]      8 Days Post-Op Procedure(s) (LRB): CORONARY ARTERY BYPASS GRAFTING (CABG), ON PUMP, TIMES ONE, USING LEFT INTERNAL MAMMARY ARTERY (N/A) EXCISION OF ATRIAL MYXOMA (N/A) TRANSESOPHAGEAL ECHOCARDIOGRAM (TEE) (N/A)   Subjective:  Valerie Rollins has no complaints this morning.  She is doing well.  + ambulation + BM  Objective: Vital signs in last 24 hours: Temp:  [97.4 F (36.3 C)-98.5 F (36.9 C)] 98.5 F (36.9 C) (05/18 0556) Pulse Rate:  [61-69] 61 (05/18 0556) Cardiac Rhythm:  [-] Other (Comment);Normal sinus rhythm (05/18 0755) Resp:  [18-20] 18 (05/18 0556) BP: (110-125)/(56-75) 118/56 mmHg (05/18 0556) SpO2:  [93 %-99 %] 94 % (05/18 0843) Weight:  [181 lb 3.5 oz (82.2 kg)] 181 lb 3.5 oz (82.2 kg) (05/18 0556)  Intake/Output from previous day: 05/17 0701 - 05/18 0700 In: 269 [P.O.:240; I.V.:214] Out: 2050 [Urine:2050]  General appearance: alert, cooperative and no distress Heart: regular rate and rhythm Lungs: clear to auscultation bilaterally Abdomen: soft, non-tender; bowel sounds normal; no masses,  no organomegaly Extremities: edema trace Wound: clean and dry  Lab Results:  Recent Labs  04/07/15 0320  WBC 7.4  HGB 10.0*  HCT 30.1*  PLT 297   BMET:  Recent Labs  04/07/15 0320  NA 139  K 4.4  CL 105  CO2 25  GLUCOSE 90  BUN 16  CREATININE 0.89  CALCIUM 9.1    PT/INR:  Recent Labs  04/09/15 0430  LABPROT 17.7*  INR 1.44   ABG    Component Value Date/Time   PHART 7.347* 04/02/2015 0006   HCO3 20.8 04/02/2015 0006   TCO2 20 04/02/2015 1651   ACIDBASEDEF 4.0* 04/02/2015 0006   O2SAT 95.0 04/02/2015 0006   CBG (last 3)  No results for input(s): GLUCAP in the last 72 hours.  Assessment/Plan: S/P Procedure(s) (LRB): CORONARY ARTERY BYPASS GRAFTING (CABG), ON PUMP, TIMES ONE, USING LEFT INTERNAL MAMMARY ARTERY (N/A) EXCISION OF ATRIAL MYXOMA  (N/A) TRANSESOPHAGEAL ECHOCARDIOGRAM (TEE) (N/A)  1. CV- previous A. Fib, currently NSR rate in the 60s- will continue Lopressor, Amiodarone 2. INR 1.44, will repeat coumadin at 7.5 mg daily 3. Renal- weight is almost at baseline, continue Lasix for now 4. Dispo- patient doing well, will d/c EPW today, redose Coumadin at 7.5 tonight, likely home in AM   LOS: 8 days    BARRETT, ERIN 04/09/2015  I have seen and examined the patient and agree with the assessment and plan as outlined.  Rexene Alberts 04/09/2015 5:35 PM

## 2015-04-09 NOTE — Consult Note (Signed)
ANTICOAGULATION CONSULT NOTE - Initial Consult  Pharmacy Consult for Heparin and Coumadin Indication: atrial fibrillation  Allergies  Allergen Reactions  . Oxybutynin Chloride     Tongue swelling; Rx from Dr Hessie Diener  . Propoxyphene N-Acetaminophen     REACTION: made pt blood to thin  . Alendronate Sodium     ? reaction  . Ramipril     REACTION: cough    Patient Measurements: Height: 5' 5.5" (166.4 cm) Weight: 181 lb 3.5 oz (82.2 kg) IBW/kg (Calculated) : 58.15 Heparin Dosing Weight: 75kg  Vital Signs: Temp: 98.5 F (36.9 C) (05/18 0556) Temp Source: Oral (05/18 0556) BP: 118/56 mmHg (05/18 0556) Pulse Rate: 61 (05/18 0556)  Labs:  Recent Labs  04/07/15 0320 04/08/15 0330 04/09/15 0430  HGB 10.0*  --   --   HCT 30.1*  --   --   PLT 297  --   --   LABPROT 14.7 15.1 17.7*  INR 1.13 1.18 1.44  CREATININE 0.89  --   --     Estimated Creatinine Clearance: 57.6 mL/min (by C-G formula based on Cr of 0.89).   Assessment: 78yof POD#8 CABG x1 and excision of atrial myoma now with post op afib, on coumadin. INR 1.44 this morning. Amiodarone changed to po.   Goal of Therapy:  INR = 2 - 3 Monitor platelets by anticoagulation protocol: Yes   Plan:  - Coumadin 7.5mg  x 1 - Daily INR  Maryanna Shape, PharmD, BCPS  Clinical Pharmacist  Pager: [phone]   04/09/2015,8:57 AM

## 2015-04-09 NOTE — Progress Notes (Signed)
04/09/2015 1100 EPW D/C'd per order and per protocol.  Ends intact. Pt. Tolerated well.  Advised bedrest x1hr.  Call bell in reach.  Vital signs collected per protocol. [place]ney [place]ers

## 2015-04-09 NOTE — Progress Notes (Signed)
CARDIAC REHAB PHASE I   PRE:  Rate/Rhythm: 63SR  BP:  Supine:   Sitting: 105/60  Standing:    SaO2: 92% RA  MODE:  Ambulation: 690 ft   POST:  Rate/Rhythm: 72 SR  BP:  Supine:   Sitting: 109/62  Standing:    SaO2: 96% RA Pt. Walked 690 feet with rolattor. Pt did take 3 sitting rest break for 2-3 minutes. Pt. C/o of SOB. Pt. SpO2 remained over 90% and VSS. Pt. Returned to recliner with LE elevated. Pt practiced IS, 750-135mL. Pt. Has a rolattor at home for use.  [place] 04/09/2015 2:02 PM

## 2015-04-09 NOTE — Care Management (Signed)
Medicare Important Message given? Yes (If Response is "NO", the following Medicare IM given date fields will be blank) Date Medicare IM given:  04/09/15 Medicare IM given by: Rowe Warman 

## 2015-04-09 NOTE — Progress Notes (Signed)
       Patient Name: Valerie Rollins Date of Encounter: 04/09/2015    SUBJECTIVE:Up walking. Denies dyspnea.   TELEMETRY:  A fib with rate 55-65 bpm and not much movement with activity Filed Vitals:   04/08/15 1646 04/08/15 2042 04/09/15 0556 04/09/15 0843  BP: 110/57 116/75 118/56   Pulse: 65 69 61   Temp: 97.4 F (36.3 C) 98.1 F (36.7 C) 98.5 F (36.9 C)   TempSrc: Oral Oral Oral   Resp: 18 20 18    Height:      Weight:   181 lb 3.5 oz (82.2 kg)   SpO2: 97% 99% 93% 94%    Intake/Output Summary (Last 24 hours) at 04/09/15 0947 Last data filed at 04/09/15 0439  Gross per 24 hour  Intake    454 ml  Output   1800 ml  Net  -1346 ml   LABS: Basic Metabolic Panel:  Recent Labs  04/07/15 0320  NA 139  K 4.4  CL 105  CO2 25  GLUCOSE 90  BUN 16  CREATININE 0.89  CALCIUM 9.1   CBC:  Recent Labs  04/07/15 0320  WBC 7.4  HGB 10.0*  HCT 30.1*  MCV 85.5  PLT 297   INR 1.44  Radiology/Studies:  No new  Physical Exam: Blood pressure 118/56, pulse 61, temperature 98.5 F (36.9 C), temperature source Oral, resp. rate 18, height 5' 5.5" (1.664 m), weight 181 lb 3.5 oz (82.2 kg), SpO2 94 %. Weight change: 1 lb 1.6 oz (0.5 kg)  Wt Readings from Last 3 Encounters:  04/09/15 181 lb 3.5 oz (82.2 kg)  03/31/15 178 lb 2.1 oz (80.8 kg)  03/28/15 178 lb (80.74 kg)    Chest is clear No pericardial rub  ASSESSMENT:  1. Post Operative AF with controlled rate and very little variation with activity  Plan:  1. Decrease the amiodarone to 200 mg BID 2. Ambulate 3. If does not convert over next 2-3 weeks as OP will need OP cardioversion by her primary cardiologist. 4. Anticoagulation.  Demetrios Isaacs 04/09/2015, 9:47 AM

## 2015-04-09 NOTE — Discharge Summary (Signed)
Physician Discharge Summary       [address]       [place] [address]             [phone]    Patient ID: Valerie Rollins MRN: [id number] DOB/AGE: 77-May-1937 77 y.o.  Admit date: 04/01/2015 Discharge date: 04/10/2015  Admission Diagnoses: 1. Left atrial myxoma 2. Atrial fibrillation 3. Single vessel CAD 4. History of hyperlipidemia 5. History of hypertension 6. History of GERD 7. History of DVT 8. History of IBS 9. History of skin cancer 10. History of ocular hypertension 11. History of diverticulosis/diverticulitis   Discharge Diagnoses:  1. Left atrial mysoma 2. Atrial fibrillation 3. Single vessel CAD 4. History of hyperlipidemia 5. History of hypertension 6. History of GERD 7. History of DVT 8. History of IBS 9. History of skin cancer 10. History of ocular hypertension 11. History of diverticulosis/diverticulitis  12. ABL anemia 13. Cellulitis of upper extremity (secondary to Amiodarone)  Consults: Cardiology (Dr. Aundra Dubin) and pharmacy  Procedure: 1. CABG x 1 (LIMA to LAD) 2. Resection of left atrial myxoma and resection of a portion of the interatrial septum. 3. Ligation of left atrial appendage by Dr. Prescott Gum on 04/01/2015  Pathology: 1. Heart, resection of tumor, left atrial myxoma - ATRIAL MYXOMA WITH DYSTROPHIC OSTEOID FORMATION, 5.3 CM. 2. Heart, biopsy, atrial septum - CARDIAC MUSCLE AND ADIPOSE TISSUE.  History of Presenting Illness: This is a 79 year old Caucasian female recently diagnosed with a 3 cm left atrial myxoma. She was asymptomatic but echocardiogram was performed because of increased cardiac size on routine chest x-ray. Results of echo showed LV function is normal,  mild MR, 3.0 cm atrial myxoma attached to the interatrial septum. She then developed atrial fibrillation (and was on a NOAC) after surgery for left ankle fracture 2014. She has a history of cardiac murmur for several years. She underwent left heart  catheterization on  03/28/2015 and this demonstrated 80% proximal tubular LAD stenosis without significant other coronary disease.  Patient has been asymptomatic for chest pain, presyncope, dyspnea exertion, orthopnea, or PND. She has no family history of atrial myxoma. She has no fevers or chills. Chronic mild pedal edema.  Patient has history of severe bilateral varicose veins and status post vein stripping several years ago. She has no symptoms of TIA or stroke. Carotid duplex showed no significant internal carotid artery stenosis bilaterally.  Dr. Prescott Gum was consulted for the consideration of coronary artery bypass grafting surgery, resection of LA myxoma, and ligation of LAA. Potential risks, benefits, and complications were discussed with the patient and she agreed to proceed with surgery. She underwent the aforementioned surgery on 04/01/2015.  Brief Hospital Course:  The patient was extubated late the evening of surgery without difficulty. She remained afebrile and hemodynamically stable. She was weaned off of Dopamine, Neo synephrine, and Amiodarone drips. Gordy Councilman, a line, chest tubes, and foley were removed early in the post operative course. Her rhythm was initially junctional and she required AV pacing. She then back into a fib. She was restarted on IV Amiodarone. Lopressor was started and titrated accordingly,  once conduction system recovered. She then developed bradycardia followed by a fib with RVR. Cardiology was consulted. She was started on a heparin drip, which was stopped because of increased risk of bleeding as was only post operative day 5. Per Dr. Prescott Gum, he did not feel she was a NOAC  Candidate, but could be on Coumadin, short term, if needed. She  was then started on Coumadin, however, and her PT and INR were monitored daily. Her last INR was up to 2.08. She has been give Coumadin 7.5 mg the last 2 days. Will send home on Coumadin 5 mg. We will ask to have a home health RN  to draw PT and INR on Monday 05/23. INR goal is 2-2.5. PT consult was obtained to assist with mobility. She was volume over loaded and diuresed. A swallow study was done on 05/13. Recommendations were followed accordingly. She had ABL anemia. She did not require a post op transfusion. Her last H and H was 10 and 30.1. She was weaned off the insulin drip. The patient's glucose remained well controlled. The patient's HGA1C pre op was 5.8. She will need further surveillance of her HGA1C by her medical doctor after discharge. The patient was felt surgically stable for transfer from the ICU to PCTU for further convalescence on 04/03/2015. She continues to progress with cardiac rehab adn PT.She was ambulating on room air. She has been tolerating a diet and has had a bowel movement. Epicardial pacing wires were removed yesterday. Chest tube sutures will be removed prior to discharge. Per Dr. Prescott Gum, she has cellulitis on her upper extremity secondary to infiltration (was on Amiodarone IV). She will be put on Keflex 500 mg tid for one week. The patient is felt surgically stable for discharge today.  Latest Vital Signs: Blood pressure 118/59, pulse 65, temperature 97.7 F (36.5 C), temperature source Oral, resp. rate 18, height 5' 5.5" (1.664 m), weight 177 lb 6.4 oz (80.468 kg), SpO2 96 %.  Physical Exam: General appearance: alert, cooperative and no distress Heart: regular rate and rhythm Lungs: clear to auscultation bilaterally Abdomen: soft, non-tender; bowel sounds normal; no masses, no organomegaly Extremities: edema trace Wound: clean and dry  Discharge Condition:Stable and discharged to home  Recent laboratory studies:  Lab Results  Component Value Date   WBC 7.4 04/07/2015   HGB 10.0* 04/07/2015   HCT 30.1* 04/07/2015   MCV 85.5 04/07/2015   PLT 297 04/07/2015   Lab Results  Component Value Date   NA 139 04/07/2015   K 4.4 04/07/2015   CL 105 04/07/2015   CO2 25 04/07/2015    CREATININE 0.89 04/07/2015   GLUCOSE 90 04/07/2015   Diagnostic Studies: Dg Chest 2 View  04/04/2015   CLINICAL DATA:  CABG.  EXAM: CHEST  2 VIEW  COMPARISON:  04/03/2015 .  FINDINGS: Mediastinum hilar structures normal. Prior median sternotomy. Stable cardiomegaly. Normal pulmonary vascularity. Low lung volumes with mild bibasilar atelectasis and/or infiltrates. Small pleural effusions cannot be excluded. No significant interim change. No pneumothorax. No acute osseous abnormality.  IMPRESSION: 1. Interim removal of right IJ sheath. 2. Low lung volumes with stable bibasilar atelectasis and/or infiltrates. Small pleural effusions again noted. 3. No evidence of infiltrate noted in the right upper lobe on today's exam. 4. Prior CABG. Cardiomegaly is stable. No pulmonary venous congestion .   Electronically Signed   By: Marcello Moores  Register   On: 04/04/2015 07:34    Discharge Medications:   Medication List    STOP taking these medications        carvedilol 25 MG tablet  Commonly known as:  COREG     colestipol 1 G tablet  Commonly known as:  MICRONIZED COLESTIPOL HCL      TAKE these medications        amiodarone 200 MG tablet  Commonly known as:  PACERONE  Take  1 tablet (200 mg total) by mouth 2 (two) times daily. For 5 days then take Amiodarone 200 mg daily thereafter.     aspirin EC 81 MG tablet  Take 81 mg by mouth daily.     atorvastatin 10 MG tablet  Commonly known as:  LIPITOR  Take 1 tablet (10 mg total) by mouth daily at 6 PM.     calcium citrate-vitamin D 315-200 MG-UNIT per tablet  Commonly known as:  CITRACAL+D  Take 2 tablets by mouth 2 (two) times daily.     cephALEXin 500 MG capsule  Commonly known as:  KEFLEX  Take 1 capsule (500 mg total) by mouth 3 (three) times daily. For one week then stop.     dicyclomine 20 MG tablet  Commonly known as:  BENTYL  Take 1 tablet (20 mg total) by mouth every 6 (six) hours as needed for spasms.     fexofenadine 180 MG tablet    Commonly known as:  ALLEGRA  Take 180 mg by mouth as needed.     furosemide 40 MG tablet  Commonly known as:  LASIX  Take 1 tablet (40 mg total) by mouth daily. For 4 days then stop.     Ipratropium-Albuterol 20-100 MCG/ACT Aers respimat  Commonly known as:  COMBIVENT RESPIMAT  Inhale 1 puff into the lungs every 6 (six) hours.     latanoprost 0.005 % ophthalmic solution  Commonly known as:  XALATAN  Place 1 drop into both eyes daily.     losartan 25 MG tablet  Commonly known as:  COZAAR  Take 1 tablet (25 mg total) by mouth daily.     Metoprolol Tartrate 37.5 MG Tabs  Take 37.5 mg by mouth 2 (two) times daily.     montelukast 10 MG tablet  Commonly known as:  SINGULAIR  TAKE  ONE TABLET BY MOUTH NIGHTLY AT BEDTIME     multivitamin with minerals Tabs tablet  Take 1 tablet by mouth daily.     omeprazole 20 MG capsule  Commonly known as:  PRILOSEC  TAKE ONE CAPSULE BY MOUTH ONE TIME DAILY     potassium chloride SA 20 MEQ tablet  Commonly known as:  K-DUR,KLOR-CON  Take 1 tablet (20 mEq total) by mouth daily. For 4 days then stop.     traMADol 50 MG tablet  Commonly known as:  ULTRAM  Take 1 tablet (50 mg total) by mouth every 6 (six) hours as needed. Dr.Ramos     Vitamin D3 1000 UNITS Caps  Take 1,000 Units by mouth daily.     warfarin 5 MG tablet  Commonly known as:  COUMADIN  Take 1 tablet (5 mg total) by mouth daily. Or as directed.        The patient has been discharged on:   1.Beta Blocker:  Yes [  x ]                              No   [   ]                              If No, reason:  2.Ace Inhibitor/ARB: Yes [ x  ]  No  [    ]                                     If No, reason:  3.Statin:   Yes [ x  ]                  No  [   ]                  If No, reason:  4.Ecasa:  Yes  [ x  ]                  No   [   ]                  If No, reason:  Follow Up Appointments: Follow-up Information    Follow up with  Home Health.   Why:  Please draw PT and INR (as is on Coumadin) drawn for Monday 04/14/2015. INR to be between 2-2.5. Call or fax results to Dr. Jacalyn Lefevre office      Follow up with Len Childs, MD On 05/21/2015.   Specialty:  Cardiothoracic Surgery   Why:  PA/LAT CXR (to be taken at [facility] which is in the same building as Dr. Lucianne Lei Trigt's office) on at 06/29.2016 at 1:30 pm ;Appointment time is at 2:30 pm   Contact information:   [place] [address] [phone]       Follow up with Unice Cobble, MD.   Specialty:  Internal Medicine   Why:  Call for a follow up appointment for further surveillance of HGA1C 5.8 (pre diabetes)   Contact information:   [address] [address] [phone]       Follow up with Kirk Ruths, MD.   Specialty:  Cardiology   Why:  Call for a follow up appointment for 2 weeks   Contact information:   [place]  [address] [phone]       Signed: Lars Pinks MPA-C 04/10/2015, 10:09 AM

## 2015-04-10 ENCOUNTER — Telehealth: Payer: Self-pay | Admitting: *Deleted

## 2015-04-10 LAB — PROTIME-INR
INR: 2.08 — ABNORMAL HIGH (ref 0.00–1.49)
Prothrombin Time: 23.6 seconds — ABNORMAL HIGH (ref 11.6–15.2)

## 2015-04-10 MED ORDER — FUROSEMIDE 40 MG PO TABS: 40.0000 mg | ORAL_TABLET | Freq: Every day | ORAL | Status: DC

## 2015-04-10 MED ORDER — LOSARTAN POTASSIUM 25 MG PO TABS: 25.0000 mg | ORAL_TABLET | Freq: Every day | ORAL | Status: DC

## 2015-04-10 MED ORDER — AMIODARONE HCL 200 MG PO TABS: 200.0000 mg | ORAL_TABLET | Freq: Two times a day (BID) | ORAL | Status: DC

## 2015-04-10 MED ORDER — TRAMADOL HCL 50 MG PO TABS: 50.0000 mg | ORAL_TABLET | Freq: Four times a day (QID) | ORAL | Status: AC | PRN

## 2015-04-10 MED ORDER — ASPIRIN EC 81 MG PO TBEC
81.0000 mg | DELAYED_RELEASE_TABLET | Freq: Every day | ORAL | Status: DC
  Administered 2015-04-10: 81 mg via ORAL
  Filled 2015-04-10: qty 1

## 2015-04-10 MED ORDER — ATORVASTATIN CALCIUM 10 MG PO TABS: 10.0000 mg | ORAL_TABLET | Freq: Every day | ORAL | Status: DC

## 2015-04-10 MED ORDER — METOPROLOL TARTRATE 37.5 MG PO TABS: 37.5000 mg | ORAL_TABLET | Freq: Two times a day (BID) | ORAL | Status: DC

## 2015-04-10 MED ORDER — CEPHALEXIN 500 MG PO CAPS: 500.0000 mg | ORAL_CAPSULE | Freq: Three times a day (TID) | ORAL | Status: DC

## 2015-04-10 MED ORDER — AMIODARONE HCL 200 MG PO TABS
200.0000 mg | ORAL_TABLET | Freq: Two times a day (BID) | ORAL | Status: DC
  Administered 2015-04-10: 200 mg via ORAL
  Filled 2015-04-10 (×2): qty 1

## 2015-04-10 MED ORDER — POTASSIUM CHLORIDE CRYS ER 20 MEQ PO TBCR: 20.0000 meq | EXTENDED_RELEASE_TABLET | Freq: Every day | ORAL | Status: DC

## 2015-04-10 MED ORDER — WARFARIN SODIUM 5 MG PO TABS: 5.0000 mg | ORAL_TABLET | Freq: Every day | ORAL | Status: DC

## 2015-04-10 NOTE — Progress Notes (Signed)
[id number] Education completed with pt and husband who voiced understanding. Encouraged IS. Discussed CRP 2 and permission given to refer to [facility] program. Wrote down how to watch post op video as they will watch prior to discharge. Graylon Good RN BSN 04/10/2015 11:07 AM

## 2015-04-10 NOTE — Consult Note (Signed)
ANTICOAGULATION CONSULT NOTE - Initial Consult  Pharmacy Consult for Heparin and Coumadin Indication: atrial fibrillation  Allergies  Allergen Reactions  . Oxybutynin Chloride     Tongue swelling; Rx from Dr Hessie Diener  . Propoxyphene N-Acetaminophen     REACTION: made pt blood to thin  . Alendronate Sodium     ? reaction  . Ramipril     REACTION: cough    Patient Measurements: Height: 5' 5.5" (166.4 cm) Weight: 177 lb 6.4 oz (80.468 kg) IBW/kg (Calculated) : 58.15 Heparin Dosing Weight: 75kg  Vital Signs: Temp: 97.7 F (36.5 C) (05/19 0428) Temp Source: Oral (05/19 0428) BP: 131/73 mmHg (05/19 0428) Pulse Rate: 65 (05/19 0428)  Labs:  Recent Labs  04/08/15 0330 04/09/15 0430 04/10/15 0500  LABPROT 15.1 17.7* 23.6*  INR 1.18 1.44 2.08*    Estimated Creatinine Clearance: 57 mL/min (by C-G formula based on Cr of 0.89).   Assessment: 78yof POD#9 CABG x1 and excision of atrial myoma now with post op afib, on coumadin. INR 1.44 > 2.08 this morning. On PO Amiodarone  Goal of Therapy:  INR = 2 - 2.5 per CVTS Monitor platelets by anticoagulation protocol: Yes   Plan:  - Hold coumadin today - If goes home, recommend 5mg  daily from tomorrow. - Daily INR  Maryanna Shape, PharmD, BCPS  Clinical Pharmacist  Pager: [phone]   04/10/2015,9:33 AM

## 2015-04-10 NOTE — Progress Notes (Signed)
04/10/2015 2:31 PM Discharge AVS meds taken today and those due this evening reviewed.  Follow-up appointments and when to call md reviewed.  D/C IV and TELE.  Questions and concerns addressed.   D/C home per orders. Carney Corners

## 2015-04-10 NOTE — Progress Notes (Signed)
       Patient Name: Valerie Rollins Date of Encounter: 04/10/2015    SUBJECTIVE: She had a good night sleep. Heart rate is between 60 and 80 bpm. She is ambulated and felt well.  TELEMETRY:  Regular rhythm with either junctional or sinus (low voltage P waves). Heart rate is increased with.what medication adjustment. Filed Vitals:   04/09/15 2015 04/09/15 2100 04/09/15 2127 04/10/15 0428  BP:  131/72  131/73  Pulse: 71 58 61 65  Temp:  98.6 F (37 C)  97.7 F (36.5 C)  TempSrc:  Oral  Oral  Resp: 18 17  18   Height:      Weight:    177 lb 6.4 oz (80.468 kg)  SpO2: 96% 96%  96%    Intake/Output Summary (Last 24 hours) at 04/10/15 0935 Last data filed at 04/10/15 0436  Gross per 24 hour  Intake    123 ml  Output   3651 ml  Net  -3528 ml     Radiology/Studies:  No new data  Physical Exam: Blood pressure 131/73, pulse 65, temperature 97.7 F (36.5 C), temperature source Oral, resp. rate 18, height 5' 5.5" (1.664 m), weight 177 lb 6.4 oz (80.468 kg), SpO2 96 %. Weight change: -3 lb 13.1 oz (-1.732 kg)  Wt Readings from Last 3 Encounters:  04/10/15 177 lb 6.4 oz (80.468 kg)  03/31/15 178 lb 2.1 oz (80.8 kg)  03/28/15 178 lb (80.74 kg)    Chest is clear Extremities no edema Cardiac exam no rub  ASSESSMENT:  1. Paroxysmal atrial fibrillation. Currently in a regular rhythm, possibly sinus versus junctional. Agree with reduction in amiodarone dose.  Plan:  1. As amiodarone loads as outpatient, beta blocker dose may need to be decreased or discontinued.   Demetrios Isaacs 04/10/2015, 9:35 AM

## 2015-04-10 NOTE — Progress Notes (Addendum)
      [address]       [place] [address]             [phone]        9 Days Post-Op Procedure(s) (LRB): CORONARY ARTERY BYPASS GRAFTING (CABG), ON PUMP, TIMES ONE, USING LEFT INTERNAL MAMMARY ARTERY (N/A) EXCISION OF ATRIAL MYXOMA (N/A) TRANSESOPHAGEAL ECHOCARDIOGRAM (TEE) (N/A)  Subjective: Patient wants to go home.  Objective: Vital signs in last 24 hours: Temp:  [97.7 F (36.5 C)-98.6 F (37 C)] 97.7 F (36.5 C) (05/19 0428) Pulse Rate:  [58-71] 65 (05/19 0428) Cardiac Rhythm:  [-] Normal sinus rhythm (05/19 0730) Resp:  [17-18] 18 (05/19 0428) BP: (105-133)/(60-82) 131/73 mmHg (05/19 0428) SpO2:  [94 %-97 %] 96 % (05/19 0428) Weight:  [177 lb 6.4 oz (80.468 kg)] 177 lb 6.4 oz (80.468 kg) (05/19 0428)  Pre op weight 80.74 kg Current Weight  04/10/15 177 lb 6.4 oz (80.468 kg)      Intake/Output from previous day: 05/18 0701 - 05/19 0700 In: 123 [P.O.:120; I.V.:3] Out: 3651 [Urine:3650; Stool:1]   Physical Exam:  Cardiovascular: RRR Pulmonary: Clear to auscultation bilaterally; no rales, wheezes, or rhonchi. Abdomen: Soft, non tender, bowel sounds present. Extremities: Mild bilateral lower extremity edema. Wounds: Clean and dry.  No erythema or signs of infection.  Lab Results: CBC:No results for input(s): WBC, HGB, HCT, PLT in the last 72 hours. BMET: No results for input(s): NA, K, CL, CO2, GLUCOSE, BUN, CREATININE, CALCIUM in the last 72 hours.  PT/INR:  Lab Results  Component Value Date   INR 2.08* 04/10/2015   INR 1.44 04/09/2015   INR 1.18 04/08/2015   ABG:  INR: Will add last result for INR, ABG once components are confirmed Will add last 4 CBG results once components are confirmed  Assessment/Plan:  1. CV - Previous a fib. Maintaining SR in the 60's. On Amiodarone 400 mg bid, Lopressor 37.5 mg bid, Cozaar 25 mg daily, and Coumadin. INR increased from 1.44 to 2.08 from Couamdin 7.5 mg. Will likely require alternating  doses of 5 and 7.5. Will send home on 5 mg of Coumadin. Also, will decrease EC aspirin to 81 daily. 2.  Pulmonary - On room air. Encourage incentive spirometer 3. Volume Overload - On Lasix 40 mg daily 4.  Acute blood loss anemia - Last H and H stable at 10 and 30.1 5. Remove sutures 6. Likely discharge after seen by Dr. Prescott Gum   [facility] MPA-C 04/10/2015,8:23 AM  agree with above assessment and plan Coumadin 5 mg DAILY with goal  INR2.0-2.5 Home with [facility] for INR draw and restorative care, Home PT  patient examined and medical record reviewed,agree with above note. Tharon Aquas Trigt III 04/10/2015

## 2015-04-10 NOTE — Progress Notes (Signed)
04/10/2015 1330 CTS x2 removed.  Steri strips applied.  Pt tolerated well. Carney Corners

## 2015-04-10 NOTE — Care Management Note (Addendum)
Case Management Note  Patient Details  Name: YATZARI JONSSON MRN: [id number] Date of Birth: 1936/08/14  Subjective/Objective:   Pt admitted with planned CABG            Action/Plan:  CM will continue to monitor for disposition needs   Expected Discharge Date:                  Expected Discharge Plan:  [place]  In-House Referral:     Discharge planning Services  CM Consult, [facility] Consult  Post Acute Care Choice:    Choice offered to:  Patient  DME Arranged:    DME Agency:     HH Arranged:  RN, PT [place] Agency:  [facility]  Status of Service:  Completed, signed off  Medicare Important Message Given:  Yes Date Medicare IM Given:  04/04/15 Medicare IM give by:  Marvetta Gibbons Date Additional Medicare IM Given:  04/07/15 Additional Medicare Important Message give by:  Elenor Quinones  If discussed at [place] Length of Stay Meetings, dates discussed:  04/10/15   Additional Comments: 04/10/15 Elenor Quinones, RN, BSN [phone].  [facility] is aware of consult.  CM offered pt choice for [facility] services ordered.  Pt chose [facility].  CM verified pts address and phone number in [place].  CM was informed by pt that she has all the equipment that she needs. PT did not recommend DME.  CM contacted [place] informed of referral, MD request documented on comment section of Consult to Care Management Order  (Needs [facility] RN to draw a PT and INR on Monday 04/14/15 and call or fax results to Dr. Jacalyn Lefevre office) , referral accepted.  Per pt and husband; husband will provide supervision for mobility as recommended by PT.  No additional CM needs at this time.    Maryclare Labrador, RN 04/10/2015, 10:56 AM

## 2015-04-10 NOTE — Telephone Encounter (Signed)
Transition Care Management Follow-up Telephone Call D/C 04/10/15  How have you been since you were released from the hospital? Pt states she is feeling ok   Do you understand why you were in the hospital? YES   Do you understand the discharge instrcutions? YES  Items Reviewed:  Medications reviewed: YES  Allergies reviewed: YES  Dietary changes reviewed: NO  Referrals reviewed: {No referral needed   Functional Questionnaire:   Activities of Daily Living (ADLs):   She states she are independent in the following: ambulation, bathing and hygiene, feeding, continence, grooming, toileting and dressing States they doesn't require assistance   Any transportation issues/concerns?: NO   Any patient concerns? NO   Confirmed importance and date/time of follow-up visits scheduled: YES, made appt for 04/22/15 w/Dr. Plotnikov   Confirmed with patient if condition begins to worsen call PCP or go to the ER.

## 2015-04-11 ENCOUNTER — Other Ambulatory Visit: Payer: Self-pay | Admitting: *Deleted

## 2015-04-11 DIAGNOSIS — G4700 Insomnia, unspecified: Secondary | ICD-10-CM

## 2015-04-11 MED ORDER — TEMAZEPAM 7.5 MG PO CAPS: 15.0000 mg | ORAL_CAPSULE | Freq: Every evening | ORAL | Status: DC | PRN

## 2015-04-14 ENCOUNTER — Telehealth: Payer: Self-pay | Admitting: Internal Medicine

## 2015-04-14 ENCOUNTER — Ambulatory Visit (INDEPENDENT_AMBULATORY_CARE_PROVIDER_SITE_OTHER): Payer: PPO | Admitting: Pharmacist Clinician (PhC)/ Clinical Pharmacy Specialist

## 2015-04-14 ENCOUNTER — Telehealth: Payer: Self-pay | Admitting: Cardiology

## 2015-04-14 DIAGNOSIS — Z7901 Long term (current) use of anticoagulants: Secondary | ICD-10-CM

## 2015-04-14 DIAGNOSIS — I480 Paroxysmal atrial fibrillation: Secondary | ICD-10-CM

## 2015-04-14 DIAGNOSIS — Z483 Aftercare following surgery for neoplasm: Secondary | ICD-10-CM | POA: Diagnosis not present

## 2015-04-14 LAB — POCT INR: INR: 3.6

## 2015-04-14 NOTE — Telephone Encounter (Signed)
Received a call from [place] regarding her mother's coumadin dosing.  States [facility] RN checked INR today and was 3.6 or 3.7, first time checked since discharge and notified office to see what dose adjustment was needed but had not heard back.  She received 5 mg po x 2 on 5/15 and 5/16 followed by 7.5 mg x 2 on 5/17 and 5/18 followed by 5 mg daily since.  Of note, INR 5/19 was 2.08 after the 4 doses mentioned.  She has now had another 4 doses of 5 mg daily and is supratherapeutic.  I advised that she take 1/2 tablet (2.5 mg) today and call office again tomorrow.  I suspect she will need 5 mg most days of the week with 2.5 mg a couple of days but will defer to day team.

## 2015-04-14 NOTE — Telephone Encounter (Signed)
Pt/inr 3.6/43.7  [facility] [phone]

## 2015-04-14 NOTE — Telephone Encounter (Signed)
Valerie Rollins is calling in to report some INR results for the pt

## 2015-04-15 NOTE — Telephone Encounter (Signed)
Left message for daughter to call.

## 2015-04-15 NOTE — Telephone Encounter (Signed)
Please arrange fu in coumadin clinic with adjustments for tomorrow. Kirk Ruths

## 2015-04-15 NOTE — Telephone Encounter (Signed)
jenelle called and gave inr of 3.6 yesterday, I sent it to [place]. I called Kelle Darting today and received instructions to have pt. Take 2.5 mg today and have [facility] check inr again tomorrow . Pt was told by physcian on call yesterday to only take 2.5 mg and call office today

## 2015-04-16 ENCOUNTER — Telehealth: Payer: Self-pay | Admitting: *Deleted

## 2015-04-16 ENCOUNTER — Ambulatory Visit (INDEPENDENT_AMBULATORY_CARE_PROVIDER_SITE_OTHER): Payer: PPO | Admitting: Pharmacist Clinician (PhC)/ Clinical Pharmacy Specialist

## 2015-04-16 DIAGNOSIS — Z7901 Long term (current) use of anticoagulants: Secondary | ICD-10-CM

## 2015-04-16 DIAGNOSIS — I480 Paroxysmal atrial fibrillation: Secondary | ICD-10-CM

## 2015-04-16 LAB — POCT INR: INR: 2.7

## 2015-04-16 NOTE — Telephone Encounter (Signed)
SIGNED ORDER FOR PHASE II CARDIAC REHAB FAXED.

## 2015-04-18 ENCOUNTER — Telehealth: Payer: Self-pay | Admitting: *Deleted

## 2015-04-18 NOTE — Telephone Encounter (Signed)
Signed order for S/N for INR check faxed.

## 2015-04-22 ENCOUNTER — Encounter: Payer: Self-pay | Admitting: Internal Medicine

## 2015-04-22 ENCOUNTER — Ambulatory Visit (INDEPENDENT_AMBULATORY_CARE_PROVIDER_SITE_OTHER): Payer: PPO | Admitting: Internal Medicine

## 2015-04-22 VITALS — BP 152/108 | HR 63 | Temp 98.6°F | Wt 178.8 lb

## 2015-04-22 DIAGNOSIS — I10 Essential (primary) hypertension: Secondary | ICD-10-CM | POA: Diagnosis not present

## 2015-04-22 DIAGNOSIS — D151 Benign neoplasm of heart: Secondary | ICD-10-CM | POA: Diagnosis not present

## 2015-04-22 NOTE — Progress Notes (Signed)
Pre visit review using our clinic review tool, if applicable. No additional management support is needed unless otherwise documented below in the visit note. 

## 2015-04-22 NOTE — Progress Notes (Signed)
   Subjective:    Patient ID: Valerie Rollins, female    DOB: 02/07/1936, 79 y.o.   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 The myxoma was the size of a lemon @ surgical pathology. Hospital course was surprisingly uncomplicated & she is now home with home health nursing, physical therapy, and occupational therapy. They are monitoring her blood pressure. BP has been averaging 130/70s. An INR is to be done tomorrow 6/1.  She surprisingly asymptomatic since discharge. She denies any significant cardio pulmonary symptoms. She also has no constitutional symptoms. She has been sleeping in a recliner mainly for mobility reasons. She specifically denies paroxysmal nocturnal dyspnea.   She remains on amiodarone. TSH was 5.32 on 02/26/15.   Review of Systems  Chest pain, palpitations, tachycardia, exertional dyspnea, paroxysmal nocturnal dyspnea, claudication or edema are absent.  She has no fever, chills, sweats. There's been no purulent drainage from the operative sites.        Objective:   Physical Exam  Pertinent or positive findings include: Grade 1/2 systolic murmur suggested at the right base.  She has lipedema/ nonpitting edema of the feet. Pedal pulses are decreased, especially the dorsalis pedis pulses.  General appearance :adequately nourished; in no distress. Eyes: No conjunctival inflammation or scleral icterus is present. Oral exam:  Lips and gums are healthy appearing.There is no oropharyngeal erythema or exudate noted. Dental hygiene is good. Heart:  Normal rate and regular rhythm. S1 and S2 normal without gallop, click, rub or other extra sounds   Lungs:Chest clear to auscultation; no wheezes, rhonchi,rales ,or rubs present.No increased work of breathing. Abdomen: bowel sounds normal, soft and non-tender without masses, organomegaly or hernias noted.  No guarding or rebound.  Vascular : all pulses equal ; no bruits present. Skin:Warm &  dry.  Intact without suspicious lesions or rashes ; no tenting or jaundice. Wounds are well-healed without signs of cellulitis or purulence. Lymphatic: No lymphadenopathy is noted about the head, neck, axilla Neuro: Strength slightly reduced generally        Assessment & Plan:  #1 status post left atrial myxoma resection; no complications suggested @ this time. #2 atrial fib, resolved.  Plan: Blood pressure will be continued to be monitored. No change in medicines unless blood pressure exceeds 140/90.

## 2015-04-22 NOTE — Patient Instructions (Signed)

## 2015-04-23 ENCOUNTER — Ambulatory Visit (INDEPENDENT_AMBULATORY_CARE_PROVIDER_SITE_OTHER): Payer: PPO | Admitting: Pharmacist Clinician (PhC)/ Clinical Pharmacy Specialist

## 2015-04-23 DIAGNOSIS — Z7901 Long term (current) use of anticoagulants: Secondary | ICD-10-CM

## 2015-04-23 DIAGNOSIS — I480 Paroxysmal atrial fibrillation: Secondary | ICD-10-CM

## 2015-04-23 LAB — POCT INR: INR: 2.9

## 2015-04-24 NOTE — Patient Outreach (Signed)
[facility]) Care Management  04/24/2015  Valerie Rollins [birth year] [id number]   Referral received from [place], assigned Sherrin Daisy, RN and Nat Christen, LCSW.  Ronnell Freshwater. [place], [place] Management [place] Assistant Phone: [phone] Fax: [phone]

## 2015-04-29 ENCOUNTER — Encounter: Payer: Self-pay | Admitting: Nurse Practitioner

## 2015-04-29 ENCOUNTER — Ambulatory Visit (INDEPENDENT_AMBULATORY_CARE_PROVIDER_SITE_OTHER): Payer: PPO | Admitting: Nurse Practitioner

## 2015-04-29 ENCOUNTER — Ambulatory Visit (INDEPENDENT_AMBULATORY_CARE_PROVIDER_SITE_OTHER): Payer: PPO | Admitting: *Deleted

## 2015-04-29 VITALS — BP 150/90 | HR 64 | Ht 65.5 in | Wt 177.4 lb

## 2015-04-29 DIAGNOSIS — I480 Paroxysmal atrial fibrillation: Secondary | ICD-10-CM | POA: Diagnosis not present

## 2015-04-29 DIAGNOSIS — Z7901 Long term (current) use of anticoagulants: Secondary | ICD-10-CM

## 2015-04-29 DIAGNOSIS — Z951 Presence of aortocoronary bypass graft: Secondary | ICD-10-CM

## 2015-04-29 DIAGNOSIS — Z79899 Other long term (current) drug therapy: Secondary | ICD-10-CM

## 2015-04-29 DIAGNOSIS — D151 Benign neoplasm of heart: Secondary | ICD-10-CM

## 2015-04-29 LAB — HEPATIC FUNCTION PANEL
ALT: 82 U/L — ABNORMAL HIGH (ref 0–35)
AST: 62 U/L — ABNORMAL HIGH (ref 0–37)
Albumin: 4.2 g/dL (ref 3.5–5.2)
Alkaline Phosphatase: 143 U/L — ABNORMAL HIGH (ref 39–117)
Bilirubin, Direct: 0.1 mg/dL (ref 0.0–0.3)
Total Bilirubin: 0.3 mg/dL (ref 0.2–1.2)
Total Protein: 6.8 g/dL (ref 6.0–8.3)

## 2015-04-29 LAB — BASIC METABOLIC PANEL
BUN: 20 mg/dL (ref 6–23)
CO2: 30 mEq/L (ref 19–32)
Calcium: 9.5 mg/dL (ref 8.4–10.5)
Chloride: 104 mEq/L (ref 96–112)
Creatinine, Ser: 1.04 mg/dL (ref 0.40–1.20)
GFR: 54.65 mL/min — ABNORMAL LOW (ref >60.00)
Glucose, Bld: 108 mg/dL — ABNORMAL HIGH (ref 70–99)
Potassium: 4 mEq/L (ref 3.5–5.1)
Sodium: 141 mEq/L (ref 135–145)

## 2015-04-29 LAB — CBC
HCT: 35.6 % — ABNORMAL LOW (ref 36.0–46.0)
Hemoglobin: 11.5 g/dL — ABNORMAL LOW (ref 12.0–15.0)
MCHC: 32.3 g/dL (ref 30.0–36.0)
MCV: 86.8 fl (ref 78.0–100.0)
Platelets: 293 10*3/uL (ref 150.0–400.0)
RBC: 4.1 Mil/uL (ref 3.87–5.11)
RDW: 15.1 % (ref 11.5–15.5)
WBC: 6.5 10*3/uL (ref 4.0–10.5)

## 2015-04-29 LAB — TSH: TSH: 3.9 u[IU]/mL (ref 0.35–4.50)

## 2015-04-29 LAB — POCT INR: INR: 3.3

## 2015-04-29 NOTE — Progress Notes (Signed)
CARDIOLOGY OFFICE NOTE  Date:  04/29/2015    Valerie Rollins Date of Birth: 1936-05-17 Medical Record #[id number]  PCP:  Unice Cobble, MD  Cardiologist:  Stanford Breed    Chief Complaint  Patient presents with  . Post CABG/myxoma removal    Post hospital visit - seen for Dr. Stanford Breed.     History of Present Illness: Valerie Rollins is a 79 y.o. female who presents today for a post hospital visit. Seen for Dr. Stanford Breed. She has had postoperative atrial fibrillation 12/12 after ankle surgery.  She underwent left heart catheterization on 03/28/2015 and this demonstrated 80% proximal tubular LAD stenosis without significant other coronary disease. She has had a left atrial mass - myxoma - subsequently underwent surgical removal and CABG x 1 with LIMA to LAD per Dr. Prescott Gum. Now on amiodarone and coumadin. It was noted in her record that Dr. Prescott Gum preferred coumadin rather than NOAC.  Comes in today. Here with her husband. She has been home a few weeks. Doing ok. Walking. Has restarted her lasix for swelling - was on this prior to surgery for her BP. Has had issues with swelling in the past from varicose veins/DVT - does not like support stockings.  Some chest soreness. No fever or cough. BP better at home. No palpitations. No problem with her coumadin - has been on in the past - notes it was hard to regulate - would prefer to have checked with PCP due to cost. Would like to go to rehab once her therapy at home is complete.    Past Medical History: 1. HTN 2. Prior DVT 3. Atrial fibrillation: Paroxysmal. Had post-op atrial fibrillation after 3/14 ankle surgery. Now with post-op atrial fibrillation after myxoma removal. She had LA appendage ligation at time of LA myxoma removal.  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  5. IBS 6. TAH/BSO 7. CAD: LHC (5/16) with serial 70% and 80% LAD stenoses, now s/p LIMA-LAD at time of LA myxoma removal.  8. Echo (4/16) with EF 55-60%, LA mass concerning for  myxoma.    Past Medical History  Diagnosis Date  . Diverticulitis 2002    based on CT scan  . Hyperlipidemia   . Hypertension   . Superficial phlebitis      X 2, 1999, 2000  . Ocular hypertension     glaucoma suspect, Dr. Kathrin Penner  . Skin cancer   . Endometriosis   . Gastritis 2006    @ Endo  . Hiatal hernia   . Diverticulosis     Dr Henrene Pastor  . GERD (gastroesophageal reflux disease)   . DVT (deep venous thrombosis) 2000    no trigger  . Syncope 11/12/11    in context CAP . Carotid Doppler exam was negative. 2D echocardiogram revealed mild dilation of the left atrium and moderate increase in the systolic pressureof  the pulmonary artery  . Atrial fibrillation     Post op after repair of ankle fracture  . UTI (lower urinary tract infection) 01/2013    Strep bovis ; PCN sensitive  . IBS (irritable bowel syndrome)     Past Surgical History  Procedure Laterality Date  . Total abdominal hysterectomy w/ bilateral salpingoophorectomy  1980    dysfunctional menses, endometriosis  . Varicose vein surgery      1960, 1965, 2009  . Tonsillectomy    . Cataract extraction w/ intraocular lens implant      OS; Dr Kathrin Penner  . Colonoscopy  1995,  2002, 2012    diverticulosis; Dr Henrene Pastor  . Orif ankle fracture  11/14/2011    Procedure: OPEN REDUCTION INTERNAL FIXATION (ORIF) ANKLE FRACTURE;  Surgeon: Valerie Rhein;  Location: [facility];  Service: Orthopedics;  Laterality: Left;  open reduction internal fixation trimalleolar ankle fracture  . Cardiac catheterization N/A 03/28/2015    Procedure: Left Heart Cath and Coronary Angiography;  Surgeon: Jolaine Artist, MD;  Location: [facility] INVASIVE CV LAB CUPID;  Service: Cardiovascular;  Laterality: N/A;  . Coronary artery bypass graft N/A 04/01/2015    Procedure: CORONARY ARTERY BYPASS GRAFTING (CABG), ON PUMP, TIMES ONE, USING LEFT INTERNAL MAMMARY ARTERY;  Surgeon: Ivin Poot, MD;  Location: [facility];  Service: Open Heart Surgery;   Laterality: N/A;  . Excision of atrial myxoma N/A 04/01/2015    Procedure: EXCISION OF ATRIAL MYXOMA;  Surgeon: Ivin Poot, MD;  Location: [facility];  Service: Open Heart Surgery;  Laterality: N/A;  . Tee without cardioversion N/A 04/01/2015    Procedure: TRANSESOPHAGEAL ECHOCARDIOGRAM (TEE);  Surgeon: Ivin Poot, MD;  Location: [facility];  Service: Open Heart Surgery;  Laterality: N/A;     Medications: Current Outpatient Prescriptions  Medication Sig Dispense Refill  . amiodarone (PACERONE) 200 MG tablet Take 1 tablet (200 mg total) by mouth 2 (two) times daily. For 5 days then take Amiodarone 200 mg daily thereafter. (Patient taking differently: Take 200 mg by mouth daily. ) 30 tablet 1  . aspirin EC 81 MG tablet Take 81 mg by mouth daily.    Marland Kitchen atorvastatin (LIPITOR) 10 MG tablet Take 1 tablet (10 mg total) by mouth daily at 6 PM. 30 tablet 1  . calcium citrate-vitamin D (CITRACAL+D) 315-200 MG-UNIT per tablet Take 2 tablets by mouth 2 (two) times daily.    . Cholecalciferol (VITAMIN D3) 1000 UNITS CAPS Take 1,000 Units by mouth daily.     Marland Kitchen docusate sodium (COLACE) 100 MG capsule Take 100 mg by mouth daily.    . fexofenadine (ALLEGRA) 180 MG tablet Take 180 mg by mouth as needed.     . furosemide (LASIX) 40 MG tablet Take 1 tablet (40 mg total) by mouth daily. For 4 days then stop. (Patient taking differently: Take 20 mg by mouth daily. ) 4 tablet 0  . latanoprost (XALATAN) 0.005 % ophthalmic solution Place 1 drop into both eyes daily.      Marland Kitchen losartan (COZAAR) 25 MG tablet Take 1 tablet (25 mg total) by mouth daily. 30 tablet 1  . Metoprolol Tartrate 37.5 MG TABS Take 37.5 mg by mouth 2 (two) times daily. 60 tablet 1  . montelukast (SINGULAIR) 10 MG tablet Take 10 mg by mouth at bedtime.  1  . Multiple Vitamin (MULITIVITAMIN WITH MINERALS) TABS Take 1 tablet by mouth daily.    Marland Kitchen omeprazole (PRILOSEC) 20 MG capsule TAKE ONE CAPSULE BY MOUTH ONE TIME DAILY 90 capsule 3  . temazepam  (RESTORIL) 7.5 MG capsule Take 2 capsules (15 mg total) by mouth at bedtime as needed for sleep. 40 capsule 0  . traMADol (ULTRAM) 50 MG tablet Take 1 tablet (50 mg total) by mouth every 6 (six) hours as needed. Dr.Ramos 30 tablet 0  . warfarin (COUMADIN) 5 MG tablet Take 1 tablet (5 mg total) by mouth daily. Or as directed. 30 tablet 1   Current Facility-Administered Medications  Medication Dose Route Frequency Provider Last Rate Last Dose  . pneumococcal 13-valent conjugate vaccine (PREVNAR 13) injection 0.5 mL  0.5 mL  Intramuscular Tomorrow-1000 Hendricks Limes, MD        Allergies: Allergies  Allergen Reactions  . Oxybutynin Chloride     Tongue swelling; Rx from Dr Hessie Diener  . Propoxyphene N-Acetaminophen     REACTION: made pt blood to thin  . Alendronate Sodium     ? reaction  . Ramipril     REACTION: cough    Social History: The patient  reports that she has never smoked. She has never used smokeless tobacco. She reports that she does not drink alcohol or use illicit drugs.   Family History: The patient's family history includes Alzheimer's disease in her mother; Colon cancer in her maternal grandmother; Diverticulitis in her daughter; Heart attack (age of onset: 4) in her father; Heart attack (age of onset: 72) in her paternal grandmother; Hyperlipidemia in her brother; Hypertension in her brother. There is no history of Diabetes or Stroke.   Review of Systems: Please see the history of present illness.   Otherwise, the review of systems is positive for none.   All other systems are reviewed and negative.   Physical Exam: VS:  BP 150/90 mmHg  Pulse 64  Ht 5' 5.5" (1.664 m)  Wt 177 lb 6.4 oz (80.468 kg)  BMI 29.06 kg/m2 .  BMI Body mass index is 29.06 kg/(m^2).  Wt Readings from Last 3 Encounters:  04/29/15 177 lb 6.4 oz (80.468 kg)  04/22/15 178 lb 12 oz (81.08 kg)  03/31/15 178 lb 2.1 oz (80.8 kg)    General: Pleasant. Well developed, well nourished and in no acute  distress.  HEENT: Normal. Neck: Supple, no JVD, carotid bruits, or masses noted.  Cardiac: Regular rate and rhythm. Very soft outflow murmur. Legs are full with multiple varicosities.  Respiratory:  Lungs are clear to auscultation bilaterally with normal work of breathing.  GI: Soft and nontender.  MS: No deformity or atrophy. Gait and ROM intact. Skin: Warm and dry. Color is normal.  Neuro:  Strength and sensation are intact and no gross focal deficits noted.  Psych: Alert, appropriate and with normal affect.   LABORATORY DATA:  EKG:  EKG is ordered today. This demonstrates NSR with RBBB.  Lab Results  Component Value Date   WBC 7.4 04/07/2015   HGB 10.0* 04/07/2015   HCT 30.1* 04/07/2015   PLT 297 04/07/2015   GLUCOSE 90 04/07/2015   CHOL 205* 02/26/2015   TRIG 102 02/26/2015   HDL 65 02/26/2015   LDLDIRECT 152.6 07/31/2013   LDLCALC 120* 02/26/2015   ALT 17 03/31/2015   AST 23 03/31/2015   NA 139 04/07/2015   K 4.4 04/07/2015   CL 105 04/07/2015   CREATININE 0.89 04/07/2015   BUN 16 04/07/2015   CO2 25 04/07/2015   TSH 5.32* 02/26/2015   INR 2.9 04/23/2015   HGBA1C 5.8* 03/31/2015    BNP (last 3 results) No results for input(s): BNP in the last 8760 hours.  ProBNP (last 3 results) No results for input(s): PROBNP in the last 8760 hours.   Other Studies Reviewed Today:   Assessment/Plan: 1. Post op myxoma removal with CABG x 1 - doing well clinically.  2. HTN - better control at home - will continue to monitor.  3. PAF - on amiodarone - I do not think this will be long term. Needs follow up lab today.  4. Chronic anticoagulation - on Coumadin - checking today. Would prefer to have PCP check which is ok with [place]. She tells me she  has had difficulty regulating in the past - not sure when or if she would be a candidate for a NOAC. I suspect she will need life long anticoagulation - CHADSVASC is 5 with a 6.7% annual risk of stroke (female, age, HTN, vascular dx).    Current medicines are reviewed with the patient today.  The patient does not have concerns regarding medicines other than what has been noted above.  The following changes have been made:  See above.  Labs/ tests ordered today include:    Orders Placed This Encounter  Procedures  . Basic metabolic panel  . CBC  . Hepatic function panel  . TSH  . EKG 12-Lead     Disposition:   FU with me in 4 weeks with EKG and Dr. Stanford Breed in 3 months.   Patient is agreeable to this plan and will call if any problems develop in the interim.   Signed: Burtis Junes, RN, ANP-C 04/29/2015 3:59 PM  [facility] [address] [place], [place]  [address] Phone: [phone] Fax: [phone]

## 2015-04-29 NOTE — Patient Instructions (Signed)
We will be checking the following labs today - BMET, CBC, HPF and TSH  Ok to let Dr. Linna Darner monitor your coumadin.    Medication Instructions:    Continue with your current medicines.     Testing/Procedures To Be Arranged:  N/A  Follow-Up:   See me in 4 weeks and Dr. Stanford Breed in 3 months   Other Special Instructions:   N/A  Call the [place] office at [phone] if you have any questions, problems or concerns.

## 2015-04-30 ENCOUNTER — Other Ambulatory Visit: Payer: Self-pay | Admitting: *Deleted

## 2015-04-30 ENCOUNTER — Telehealth: Payer: Self-pay | Admitting: *Deleted

## 2015-04-30 DIAGNOSIS — R945 Abnormal results of liver function studies: Principal | ICD-10-CM

## 2015-04-30 DIAGNOSIS — R7989 Other specified abnormal findings of blood chemistry: Secondary | ICD-10-CM

## 2015-04-30 MED ORDER — AMIODARONE HCL 200 MG PO TABS
100.0000 mg | ORAL_TABLET | Freq: Every day | ORAL | Status: DC
Start: 2015-04-30 — End: 2015-05-30

## 2015-04-30 NOTE — Telephone Encounter (Signed)
Forgot to discuss dental procedure in lab results.Cecille Rubin advised pt to hold off on teeth cleaning for at least 2 months and pt will need antibiotics like previously for at least for dental cleaning. The antibiotic for dental procedures is not prescribed by our office.

## 2015-05-07 ENCOUNTER — Ambulatory Visit (INDEPENDENT_AMBULATORY_CARE_PROVIDER_SITE_OTHER): Payer: PPO | Admitting: *Deleted

## 2015-05-07 ENCOUNTER — Other Ambulatory Visit (INDEPENDENT_AMBULATORY_CARE_PROVIDER_SITE_OTHER): Payer: PPO | Admitting: *Deleted

## 2015-05-07 DIAGNOSIS — Z7901 Long term (current) use of anticoagulants: Secondary | ICD-10-CM

## 2015-05-07 DIAGNOSIS — R945 Abnormal results of liver function studies: Principal | ICD-10-CM

## 2015-05-07 DIAGNOSIS — I480 Paroxysmal atrial fibrillation: Secondary | ICD-10-CM | POA: Diagnosis not present

## 2015-05-07 DIAGNOSIS — R7989 Other specified abnormal findings of blood chemistry: Secondary | ICD-10-CM | POA: Diagnosis not present

## 2015-05-07 LAB — HEPATIC FUNCTION PANEL
ALT: 55 U/L — ABNORMAL HIGH (ref 0–35)
AST: 40 U/L — ABNORMAL HIGH (ref 0–37)
Albumin: 4.4 g/dL (ref 3.5–5.2)
Alkaline Phosphatase: 117 U/L (ref 39–117)
Bilirubin, Direct: 0.1 mg/dL (ref 0.0–0.3)
Total Bilirubin: 0.4 mg/dL (ref 0.2–1.2)
Total Protein: 7.1 g/dL (ref 6.0–8.3)

## 2015-05-07 LAB — POCT INR: INR: 3

## 2015-05-07 LAB — CARDIAC PANEL
CK-MB: 2.2 ng/mL (ref 0.3–4.0)
Relative Index: 3.2 calc — ABNORMAL HIGH (ref 0.0–2.5)
Total CK: 68 U/L (ref 7–177)

## 2015-05-07 NOTE — Addendum Note (Signed)
Addended by: Eulis Foster on: 05/07/2015 01:57 PM   Modules accepted: Orders

## 2015-05-07 NOTE — Addendum Note (Signed)
Addended by: Eulis Foster on: 05/07/2015 01:58 PM   Modules accepted: Orders

## 2015-05-07 NOTE — Addendum Note (Signed)
Addended by: Eulis Foster on: 05/07/2015 02:06 PM   Modules accepted: Orders

## 2015-05-07 NOTE — Addendum Note (Signed)
Addended by: Eulis Foster on: 05/07/2015 02:08 PM   Modules accepted: Orders

## 2015-05-07 NOTE — Addendum Note (Signed)
Addended by: Eulis Foster on: 05/07/2015 02:07 PM   Modules accepted: Orders

## 2015-05-09 ENCOUNTER — Other Ambulatory Visit: Payer: Self-pay | Admitting: *Deleted

## 2015-05-09 DIAGNOSIS — R7989 Other specified abnormal findings of blood chemistry: Secondary | ICD-10-CM

## 2015-05-09 DIAGNOSIS — R945 Abnormal results of liver function studies: Principal | ICD-10-CM

## 2015-05-09 NOTE — Patient Outreach (Signed)
[facility]) Care Management  05/09/2015  Valerie Rollins Oct 15, 1936 [id number]  CSW received a new referral on patient from Lelon Frohlich, Case Manager with the [facility], encouraging CSW to assist patient with payment of her co-pays to physician appointments, as well as specialist appointments.  CSW explained to Lelon Frohlich that CSW is unable to provide financial assistance to patient's with paying their co-pays, as this would be illegal, unethical and a conflict of interest.  However, CSW agreed to contact patient to offer a list of community agencies and resources that may be able to assist with payment of monthly expenses, to try and offset patient's co-pay expenses.   CSW made an initial attempt to try and contact patient today to perform phone assessment, as well as assess and assist with social work needs and services, without success.  A HIPAA compliant message was left for patient on voicemail.  CSW is currently awaiting a return call.  Nat Christen, BSW, MSW, [place] [facility] [place], [place], [place] [address] Di [email or web address] [phone]

## 2015-05-12 ENCOUNTER — Telehealth: Payer: Self-pay | Admitting: *Deleted

## 2015-05-12 ENCOUNTER — Telehealth: Payer: Self-pay | Admitting: Cardiology

## 2015-05-12 NOTE — Telephone Encounter (Signed)
Spoke with Dr. Claiborne Billings who advised PT check - patient is scheduled for INR 6/22 at PCP office and she will call with the results. She will contact our office is symptoms persist or worsen.

## 2015-05-12 NOTE — Telephone Encounter (Signed)
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  She has a new complaint of a 4 out of 10 pain with deep inspiration in her posterior left lower rib cage area.  She is concerned with a possible pulmonary embolism due to her recent surgery/history.  She is presently on Coumadin.  I said she has been seen by cardiology/P.A. and I felt the appropriate route was to consult with them regarding this new symptom. If it was a pleura effusion, medical management may be indicated and Dr. Prescott Gum will see her next week as scheduled with a cxr.  She agreed.

## 2015-05-12 NOTE — Telephone Encounter (Signed)
Spoke with Belenda Cruise, PT with [facility]. She is with patient today and patient complains of new onset left lateral thoracic chest pain that increases with activity. She denies swelling and does NOT report increased SOB. Belenda Cruise, PT is concerned as patient has h/o DVT (is on aspirin & warfarin).   Will route message to DOD, Dr. Claiborne Billings to review and advise

## 2015-05-14 ENCOUNTER — Ambulatory Visit (INDEPENDENT_AMBULATORY_CARE_PROVIDER_SITE_OTHER): Payer: PPO | Admitting: General Practice

## 2015-05-14 DIAGNOSIS — Z7901 Long term (current) use of anticoagulants: Secondary | ICD-10-CM | POA: Diagnosis not present

## 2015-05-14 DIAGNOSIS — Z5181 Encounter for therapeutic drug level monitoring: Secondary | ICD-10-CM | POA: Diagnosis not present

## 2015-05-14 DIAGNOSIS — I4891 Unspecified atrial fibrillation: Secondary | ICD-10-CM | POA: Diagnosis not present

## 2015-05-14 DIAGNOSIS — I480 Paroxysmal atrial fibrillation: Secondary | ICD-10-CM | POA: Diagnosis not present

## 2015-05-14 LAB — POCT INR: INR: 1.8

## 2015-05-14 NOTE — Progress Notes (Signed)
I have reviewed and agree with the plan. 

## 2015-05-14 NOTE — Progress Notes (Signed)
Pre visit review using our clinic review tool, if applicable. No additional management support is needed unless otherwise documented below in the visit note. 

## 2015-05-15 ENCOUNTER — Other Ambulatory Visit: Payer: Self-pay | Admitting: *Deleted

## 2015-05-15 NOTE — Patient Outreach (Signed)
[facility]) Care Management  05/15/2015  Valerie Rollins 19-Feb-1936 [id number]   Phone call to patient to assess for social work needs.  Per patient and spouse, they are able to maintain their household bills, however the co-pays for patient's doctor's appointments are starting to add up.  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  Per patient's spouse, he was just inquiring if there were any programs that exist to assist patients with co-pays.    This Education officer, museum discussed not being aware of any type of programs that would cover co-pay cost, however offered community resources that may be able to offer financial assistance for other monthly expenses. Patient and spouse declined need for community resource referrals again stating that they are able to maintain their household expenses.  Patient and spouse verbalized no further social work needs at this time.   Sheralyn Boatman [facility] Care Management [phone]

## 2015-05-19 ENCOUNTER — Other Ambulatory Visit: Payer: Self-pay | Admitting: Cardiothoracic Surgery

## 2015-05-19 DIAGNOSIS — Z951 Presence of aortocoronary bypass graft: Secondary | ICD-10-CM

## 2015-05-20 ENCOUNTER — Other Ambulatory Visit: Payer: Self-pay | Admitting: *Deleted

## 2015-05-20 ENCOUNTER — Encounter: Payer: Self-pay | Admitting: *Deleted

## 2015-05-20 NOTE — Patient Outreach (Signed)
[facility]) Care Management  [facility] Social Work  05/20/2015  Valerie Rollins [birth year] [id number]  Current Medications:  Current Outpatient Prescriptions  Medication Sig Dispense Refill  . amiodarone (PACERONE) 200 MG tablet Take 0.5 tablets (100 mg total) by mouth daily. 30 tablet 6  . aspirin EC 81 MG tablet Take 81 mg by mouth daily.    Marland Kitchen atorvastatin (LIPITOR) 10 MG tablet Take 1 tablet (10 mg total) by mouth daily at 6 PM. 30 tablet 1  . calcium citrate-vitamin D (CITRACAL+D) 315-200 MG-UNIT per tablet Take 2 tablets by mouth 2 (two) times daily.    . Cholecalciferol (VITAMIN D3) 1000 UNITS CAPS Take 1,000 Units by mouth daily.     Marland Kitchen docusate sodium (COLACE) 100 MG capsule Take 100 mg by mouth daily.    . fexofenadine (ALLEGRA) 180 MG tablet Take 180 mg by mouth as needed.     . furosemide (LASIX) 40 MG tablet Take 1 tablet (40 mg total) by mouth daily. For 4 days then stop. (Patient taking differently: Take 20 mg by mouth daily. ) 4 tablet 0  . latanoprost (XALATAN) 0.005 % ophthalmic solution Place 1 drop into both eyes daily.      Marland Kitchen losartan (COZAAR) 25 MG tablet Take 1 tablet (25 mg total) by mouth daily. 30 tablet 1  . Metoprolol Tartrate 37.5 MG TABS Take 37.5 mg by mouth 2 (two) times daily. 60 tablet 1  . montelukast (SINGULAIR) 10 MG tablet Take 10 mg by mouth at bedtime.  1  . Multiple Vitamin (MULITIVITAMIN WITH MINERALS) TABS Take 1 tablet by mouth daily.    Marland Kitchen omeprazole (PRILOSEC) 20 MG capsule TAKE ONE CAPSULE BY MOUTH ONE TIME DAILY 90 capsule 3  . temazepam (RESTORIL) 7.5 MG capsule Take 2 capsules (15 mg total) by mouth at bedtime as needed for sleep. 40 capsule 0  . traMADol (ULTRAM) 50 MG tablet Take 1 tablet (50 mg total) by mouth every 6 (six) hours as needed. Dr.Ramos 30 tablet 0  . warfarin (COUMADIN) 5 MG tablet Take 1 tablet (5 mg total) by mouth daily. Or as directed. 30 tablet 1   Current Facility-Administered Medications  Medication Dose  Route Frequency Provider Last Rate Last Dose  . pneumococcal 13-valent conjugate vaccine (PREVNAR 13) injection 0.5 mL  0.5 mL Intramuscular Tomorrow-1000 Hendricks Limes, MD        Functional Status:  In your present state of health, do you have any difficulty performing the following activities: 04/06/2015 04/01/2015  Hearing? - N  Vision? - N  Difficulty concentrating or making decisions? - N  Walking or climbing stairs? - N  Dressing or bathing? - N  Doing errands, shopping? N -    Fall/Depression Screening:  PHQ 2/9 Scores 11/13/2014 07/31/2013  PHQ - 2 Score 0 0    Assessment:   CSW was able to make initial contact with patient today to assess need for continued social work involvement, as all social work needs appear to have been addressed by [place] colleague, [employer] while CSW was on vacation. CSW first obtained two HIPAA compliant identifiers from patient, which included patient's name and date of birth.  CSW then proceeded to introduce self, explain role and types of services provided through [employer].  CSW also explained to patient that CSW works with Mrs. Jenita Seashore, wanting to follow-up with patient upon CSW's return to the office to ensure that no additional social work needs have been identified  [facility]) Care Management  [facility] Social Work  05/20/2015  Valerie Rollins [birth year] [id number]  Current Medications:  Current Outpatient Prescriptions  Medication Sig Dispense Refill  . amiodarone (PACERONE) 200 MG tablet Take 0.5 tablets (100 mg total) by mouth daily. 30 tablet 6  . aspirin EC 81 MG tablet Take 81 mg by mouth daily.    Marland Kitchen atorvastatin (LIPITOR) 10 MG tablet Take 1 tablet (10 mg total) by mouth daily at 6 PM. 30 tablet 1  . calcium citrate-vitamin D (CITRACAL+D) 315-200 MG-UNIT per tablet Take 2 tablets by mouth 2 (two) times daily.    . Cholecalciferol (VITAMIN D3) 1000 UNITS CAPS Take 1,000 Units by mouth daily.     Marland Kitchen docusate sodium (COLACE) 100 MG capsule Take 100 mg by mouth daily.    . fexofenadine (ALLEGRA) 180 MG tablet Take 180 mg by mouth as needed.     . furosemide (LASIX) 40 MG tablet Take 1 tablet (40 mg total) by mouth daily. For 4 days then stop. (Patient taking differently: Take 20 mg by mouth daily. ) 4 tablet 0  . latanoprost (XALATAN) 0.005 % ophthalmic solution Place 1 drop into both eyes daily.      Marland Kitchen losartan (COZAAR) 25 MG tablet Take 1 tablet (25 mg total) by mouth daily. 30 tablet 1  . Metoprolol Tartrate 37.5 MG TABS Take 37.5 mg by mouth 2 (two) times daily. 60 tablet 1  . montelukast (SINGULAIR) 10 MG tablet Take 10 mg by mouth at bedtime.  1  . Multiple Vitamin (MULITIVITAMIN WITH MINERALS) TABS Take 1 tablet by mouth daily.    Marland Kitchen omeprazole (PRILOSEC) 20 MG capsule TAKE ONE CAPSULE BY MOUTH ONE TIME DAILY 90 capsule 3  . temazepam (RESTORIL) 7.5 MG capsule Take 2 capsules (15 mg total) by mouth at bedtime as needed for sleep. 40 capsule 0  . traMADol (ULTRAM) 50 MG tablet Take 1 tablet (50 mg total) by mouth every 6 (six) hours as needed. Dr.Ramos 30 tablet 0  . warfarin (COUMADIN) 5 MG tablet Take 1 tablet (5 mg total) by mouth daily. Or as directed. 30 tablet 1   Current Facility-Administered Medications  Medication Dose  Route Frequency Provider Last Rate Last Dose  . pneumococcal 13-valent conjugate vaccine (PREVNAR 13) injection 0.5 mL  0.5 mL Intramuscular Tomorrow-1000 Hendricks Limes, MD        Functional Status:  In your present state of health, do you have any difficulty performing the following activities: 04/06/2015 04/01/2015  Hearing? - N  Vision? - N  Difficulty concentrating or making decisions? - N  Walking or climbing stairs? - N  Dressing or bathing? - N  Doing errands, shopping? N -    Fall/Depression Screening:  PHQ 2/9 Scores 11/13/2014 07/31/2013  PHQ - 2 Score 0 0    Assessment:   CSW was able to make initial contact with patient today to assess need for continued social work involvement, as all social work needs appear to have been addressed by [place] colleague, [employer] while CSW was on vacation. CSW first obtained two HIPAA compliant identifiers from patient, which included patient's name and date of birth.  CSW then proceeded to introduce self, explain role and types of services provided through [employer].  CSW also explained to patient that CSW works with Mrs. Jenita Seashore, wanting to follow-up with patient upon CSW's return to the office to ensure that no additional social work needs have been identified

## 2015-05-21 ENCOUNTER — Other Ambulatory Visit: Payer: PPO

## 2015-05-21 ENCOUNTER — Ambulatory Visit (INDEPENDENT_AMBULATORY_CARE_PROVIDER_SITE_OTHER): Payer: Self-pay | Admitting: Cardiothoracic Surgery

## 2015-05-21 ENCOUNTER — Ambulatory Visit (HOSPITAL_COMMUNITY)
Admission: RE | Admit: 2015-05-21 | Discharge: 2015-05-21 | Disposition: A | Discharge status: Home / Self Care | Payer: PPO | Source: Ambulatory Visit | Attending: Cardiothoracic Surgery | Admitting: Cardiothoracic Surgery

## 2015-05-21 ENCOUNTER — Other Ambulatory Visit: Payer: Self-pay | Admitting: *Deleted

## 2015-05-21 ENCOUNTER — Encounter (HOSPITAL_COMMUNITY): Payer: Self-pay

## 2015-05-21 ENCOUNTER — Encounter: Payer: Self-pay | Admitting: Cardiothoracic Surgery

## 2015-05-21 ENCOUNTER — Ambulatory Visit
Admission: RE | Admit: 2015-05-21 | Discharge: 2015-05-21 | Disposition: A | Discharge status: Home / Self Care | Payer: PPO | Source: Ambulatory Visit | Attending: Cardiothoracic Surgery | Admitting: Cardiothoracic Surgery

## 2015-05-21 VITALS — BP 133/80 | HR 65 | Resp 20 | Ht 65.5 in | Wt 182.0 lb

## 2015-05-21 DIAGNOSIS — R109 Unspecified abdominal pain: Secondary | ICD-10-CM | POA: Insufficient documentation

## 2015-05-21 DIAGNOSIS — Z951 Presence of aortocoronary bypass graft: Secondary | ICD-10-CM

## 2015-05-21 DIAGNOSIS — J90 Pleural effusion, not elsewhere classified: Secondary | ICD-10-CM | POA: Diagnosis not present

## 2015-05-21 DIAGNOSIS — D219 Benign neoplasm of connective and other soft tissue, unspecified: Secondary | ICD-10-CM

## 2015-05-21 DIAGNOSIS — I2510 Atherosclerotic heart disease of native coronary artery without angina pectoris: Secondary | ICD-10-CM

## 2015-05-21 DIAGNOSIS — I2699 Other pulmonary embolism without acute cor pulmonale: Secondary | ICD-10-CM

## 2015-05-21 MED ORDER — IOHEXOL 350 MG/ML SOLN
100.0000 mL | Freq: Once | INTRAVENOUS | Status: AC | PRN
  Administered 2015-05-21: 100 mL via INTRAVENOUS

## 2015-05-22 ENCOUNTER — Other Ambulatory Visit (INDEPENDENT_AMBULATORY_CARE_PROVIDER_SITE_OTHER): Payer: PPO | Admitting: *Deleted

## 2015-05-22 ENCOUNTER — Ambulatory Visit (HOSPITAL_COMMUNITY): Payer: PPO

## 2015-05-22 ENCOUNTER — Other Ambulatory Visit: Payer: Self-pay | Admitting: Physician Assistant

## 2015-05-22 DIAGNOSIS — R7989 Other specified abnormal findings of blood chemistry: Secondary | ICD-10-CM

## 2015-05-22 DIAGNOSIS — R945 Abnormal results of liver function studies: Principal | ICD-10-CM

## 2015-05-22 LAB — HEPATIC FUNCTION PANEL
ALT: 71 U/L — ABNORMAL HIGH (ref 0–35)
AST: 48 U/L — ABNORMAL HIGH (ref 0–37)
Albumin: 3.9 g/dL (ref 3.5–5.2)
Alkaline Phosphatase: 116 U/L (ref 39–117)
Bilirubin, Direct: 0.1 mg/dL (ref 0.0–0.3)
Total Bilirubin: 0.4 mg/dL (ref 0.2–1.2)
Total Protein: 6.9 g/dL (ref 6.0–8.3)

## 2015-05-22 NOTE — Addendum Note (Signed)
Addended by: Eulis Foster on: 05/22/2015 03:09 PM   Modules accepted: Orders

## 2015-05-23 NOTE — Progress Notes (Signed)
PCP is Unice Cobble, MD Referring Provider is Lelon Perla, MD  Chief Complaint  Patient presents with  . Routine Post Op    f/u from surgery with CXR, s/p CABG x 1, Resection of left atrial myxoma and resection of a portion of the interatrial septum on 04/01/15     HPI: Doing well after resection of large left atrial myxoma-no more syncope Maintaining sinus rhythm Surgical incisions well-healed Only complaint is some left pleuritic chest pain around the left flank and cough. Chest x-ray shows some mild interstitial congestion in the left mid lung zone. Oxygen saturation drops slightly with exercise 97%-95% Patient had chest CT scan performed which ruled out pulmonary embolus and showed no evidence of pneumonia or pleural effusion. She'll complete a course of oral azithromycin return for follow-up chest x-ray in a month  Past Medical History  Diagnosis Date  . Diverticulitis 2002    based on CT scan  . Hyperlipidemia   . Hypertension   . Superficial phlebitis      X 2, 1999, 2000  . Ocular hypertension     glaucoma suspect, Dr. Kathrin Penner  . Skin cancer   . Endometriosis   . Gastritis 2006    @ Endo  . Hiatal hernia   . Diverticulosis     Dr Henrene Pastor  . GERD (gastroesophageal reflux disease)   . DVT (deep venous thrombosis) 2000    no trigger  . Syncope 11/12/11    in context CAP . Carotid Doppler exam was negative. 2D echocardiogram revealed mild dilation of the left atrium and moderate increase in the systolic pressureof  the pulmonary artery  . Atrial fibrillation     Post op after repair of ankle fracture  . UTI (lower urinary tract infection) 01/2013    Strep bovis ; PCN sensitive  . IBS (irritable bowel syndrome)     Past Surgical History  Procedure Laterality Date  . Total abdominal hysterectomy w/ bilateral salpingoophorectomy  1980    dysfunctional menses, endometriosis  . Varicose vein surgery      1960, 1965, 2009  . Tonsillectomy    . Cataract  extraction w/ intraocular lens implant      OS; Dr Kathrin Penner  . Colonoscopy  1995, 2002, 2012    diverticulosis; Dr Henrene Pastor  . Orif ankle fracture  11/14/2011    Procedure: OPEN REDUCTION INTERNAL FIXATION (ORIF) ANKLE FRACTURE;  Surgeon: Colin Rhein;  Location: [facility];  Service: Orthopedics;  Laterality: Left;  open reduction internal fixation trimalleolar ankle fracture  . Cardiac catheterization N/A 03/28/2015    Procedure: Left Heart Cath and Coronary Angiography;  Surgeon: Jolaine Artist, MD;  Location: [facility] INVASIVE CV LAB CUPID;  Service: Cardiovascular;  Laterality: N/A;  . Coronary artery bypass graft N/A 04/01/2015    Procedure: CORONARY ARTERY BYPASS GRAFTING (CABG), ON PUMP, TIMES ONE, USING LEFT INTERNAL MAMMARY ARTERY;  Surgeon: Ivin Poot, MD;  Location: [place];  Service: Open Heart Surgery;  Laterality: N/A;  . Excision of atrial myxoma N/A 04/01/2015    Procedure: EXCISION OF ATRIAL MYXOMA;  Surgeon: Ivin Poot, MD;  Location: [place];  Service: Open Heart Surgery;  Laterality: N/A;  . Tee without cardioversion N/A 04/01/2015    Procedure: TRANSESOPHAGEAL ECHOCARDIOGRAM (TEE);  Surgeon: Ivin Poot, MD;  Location: [place];  Service: Open Heart Surgery;  Laterality: N/A;    Family History  Problem Relation Age of Onset  . Heart attack Father 21  . Alzheimer's disease  Mother     TIAs  . Hyperlipidemia Brother   . Hypertension Brother   . Diverticulitis Daughter     S/P colectomy  . Colon cancer Maternal Grandmother   . Heart attack Paternal Grandmother 82  . Diabetes Neg Hx   . Stroke Neg Hx     Social History History  Substance Use Topics  . Smoking status: Never Smoker   . Smokeless tobacco: Never Used  . Alcohol Use: No    Current Outpatient Prescriptions  Medication Sig Dispense Refill  . amiodarone (PACERONE) 200 MG tablet Take 0.5 tablets (100 mg total) by mouth daily. 30 tablet 6  . aspirin EC 81 MG tablet Take 81 mg by mouth daily.    Marland Kitchen  atorvastatin (LIPITOR) 10 MG tablet Take 1 tablet (10 mg total) by mouth daily at 6 PM. 30 tablet 1  . calcium citrate-vitamin D (CITRACAL+D) 315-200 MG-UNIT per tablet Take 2 tablets by mouth 2 (two) times daily.    . Cholecalciferol (VITAMIN D3) 1000 UNITS CAPS Take 1,000 Units by mouth daily.     Marland Kitchen docusate sodium (COLACE) 100 MG capsule Take 100 mg by mouth daily.    . fexofenadine (ALLEGRA) 180 MG tablet Take 180 mg by mouth as needed.     . furosemide (LASIX) 40 MG tablet Take 1 tablet (40 mg total) by mouth daily. For 4 days then stop. (Patient taking differently: Take 20 mg by mouth daily. ) 4 tablet 0  . latanoprost (XALATAN) 0.005 % ophthalmic solution Place 1 drop into both eyes daily.      Marland Kitchen losartan (COZAAR) 25 MG tablet Take 1 tablet (25 mg total) by mouth daily. 30 tablet 1  . Metoprolol Tartrate 37.5 MG TABS Take 37.5 mg by mouth 2 (two) times daily. 60 tablet 1  . montelukast (SINGULAIR) 10 MG tablet Take 10 mg by mouth at bedtime.  1  . Multiple Vitamin (MULITIVITAMIN WITH MINERALS) TABS Take 1 tablet by mouth daily.    Marland Kitchen omeprazole (PRILOSEC) 20 MG capsule TAKE ONE CAPSULE BY MOUTH ONE TIME DAILY 90 capsule 3  . traMADol (ULTRAM) 50 MG tablet Take 1 tablet (50 mg total) by mouth every 6 (six) hours as needed. Dr.Ramos 30 tablet 0  . warfarin (COUMADIN) 5 MG tablet Take 1 tablet (5 mg total) by mouth daily. Or as directed. 30 tablet 1   Current Facility-Administered Medications  Medication Dose Route Frequency Provider Last Rate Last Dose  . pneumococcal 13-valent conjugate vaccine (PREVNAR 13) injection 0.5 mL  0.5 mL Intramuscular Tomorrow-1000 Hendricks Limes, MD        Allergies  Allergen Reactions  . Oxybutynin Chloride     Tongue swelling; Rx from Dr Hessie Diener  . Propoxyphene N-Acetaminophen     REACTION: made pt blood to thin  . Alendronate Sodium     ? reaction  . Ramipril     REACTION: cough    Review of Systems   No fever Improved strength and appetite No  significant surgical pain  BP 133/80 mmHg  Pulse 65  Resp 20  Ht 5' 5.5" (1.664 m)  Wt 182 lb (82.555 kg)  BMI 29.82 kg/m2  SpO2 94% Physical Exam Alert and comfortable Breath sounds slightly diminished on the left No murmur Sternal incision well-healed No edema  Diagnostic Tests: Chest x-ray, chest CT scan performed results as discussed above  Impression: Postop pleuritic chest pain probably related to left IMA harvest.  Plan: Continue with increased activities, driving, and  ready to start cardiac rehabilitation. I called the patient and told her that the CT scan did not show pulmonary embolus.  Len Childs, MD [facility] [phone]

## 2015-05-27 ENCOUNTER — Other Ambulatory Visit: Payer: Self-pay | Admitting: Emergency Medicine

## 2015-05-27 MED ORDER — OMEPRAZOLE 20 MG PO CPDR: DELAYED_RELEASE_CAPSULE | ORAL | Status: DC

## 2015-05-28 ENCOUNTER — Encounter (HOSPITAL_COMMUNITY): Payer: PPO

## 2015-05-28 NOTE — Patient Outreach (Signed)
[facility]) Care Management  05/20/2015   Valerie Rollins 1936-03-04 [id number]   Notification from [facility], LCSW to close case as no SW needs identified.  Ronnell Freshwater. [place] [place] Management [place] Assistant Phone: [phone] Fax: [phone]

## 2015-05-29 ENCOUNTER — Ambulatory Visit (INDEPENDENT_AMBULATORY_CARE_PROVIDER_SITE_OTHER): Payer: PPO | Admitting: General Practice

## 2015-05-29 DIAGNOSIS — Z5181 Encounter for therapeutic drug level monitoring: Secondary | ICD-10-CM

## 2015-05-29 DIAGNOSIS — I480 Paroxysmal atrial fibrillation: Secondary | ICD-10-CM

## 2015-05-29 DIAGNOSIS — Z7901 Long term (current) use of anticoagulants: Secondary | ICD-10-CM

## 2015-05-29 DIAGNOSIS — I4891 Unspecified atrial fibrillation: Secondary | ICD-10-CM

## 2015-05-29 LAB — POCT INR: INR: 1.8

## 2015-05-29 NOTE — Progress Notes (Signed)
Pre visit review using our clinic review tool, if applicable. No additional management support is needed unless otherwise documented below in the visit note. 

## 2015-05-30 ENCOUNTER — Ambulatory Visit (INDEPENDENT_AMBULATORY_CARE_PROVIDER_SITE_OTHER): Payer: PPO | Admitting: Nurse Practitioner

## 2015-05-30 ENCOUNTER — Encounter: Payer: Self-pay | Admitting: Nurse Practitioner

## 2015-05-30 ENCOUNTER — Encounter (HOSPITAL_COMMUNITY): Payer: PPO

## 2015-05-30 VITALS — BP 160/100 | HR 57 | Ht 65.5 in | Wt 180.1 lb

## 2015-05-30 DIAGNOSIS — I4891 Unspecified atrial fibrillation: Secondary | ICD-10-CM

## 2015-05-30 LAB — HEPATIC FUNCTION PANEL
ALT: 42 U/L — ABNORMAL HIGH (ref 0–35)
AST: 41 U/L — ABNORMAL HIGH (ref 0–37)
Albumin: 4.1 g/dL (ref 3.5–5.2)
Alkaline Phosphatase: 102 U/L (ref 39–117)
Bilirubin, Direct: 0.1 mg/dL (ref 0.0–0.3)
Total Bilirubin: 0.4 mg/dL (ref 0.2–1.2)
Total Protein: 7.3 g/dL (ref 6.0–8.3)

## 2015-05-30 NOTE — Patient Instructions (Addendum)
We will be checking the following labs today - LFTs   Medication Instructions:    Continue with your current medicines but  I am stopping the amiodarone   Testing/Procedures To Be Arranged:  Echocardiogram  Follow-Up:   See Dr. Stanford Breed as planned in August    Other Special Instructions:   N/A  Call the [place] office at [phone] if you have any questions, problems or concerns.

## 2015-05-30 NOTE — Progress Notes (Addendum)
CARDIOLOGY OFFICE NOTE  Date:  05/30/2015    Valerie Rollins Date of Birth: 05-18-1936 Medical Record #[id number]  PCP:  Valerie Cobble, MD  Cardiologist:  Valerie Rollins    Chief Complaint  Patient presents with  . Coronary Artery Disease    1 month check - seen for Dr. Stanford Rollins    History of Present Illness: Valerie Rollins is a 79 y.o. female who presents today for a one month check. Seen for Dr. Stanford Rollins. She has had postoperative atrial fibrillation 12/12 after ankle surgery. She underwent left heart catheterization on05/04/2015 and this demonstrated 80% proximal tubular LAD stenosis without significant other coronary disease. She has had a left atrial mass - myxoma - subsequently underwent surgical removal and CABG x 1 with LIMA to LAD per Dr. Prescott Gum in May of 2016. Now on amiodarone and coumadin. It was noted in her hospital record that Dr. Prescott Gum preferred coumadin rather than NOAC.  I saw her a month ago post op. She was doing ok. Had to restart her Lasix for swelling - this had been an issue even prior to her heart surgery.   Comes in today. Here with her husband. BP pretty high. Eats out most meals. Had "[employer]" for the 4th. Getting way too much salt. BP better at home. Feels pretty good. Breathing ok. Still with some pleuritic discomfort - had a negative CT scan. No palpitations. No chest pain. Some bruising.   Past Medical History  Diagnosis Date  . Diverticulitis 2002    based on CT scan  . Hyperlipidemia   . Hypertension   . Superficial phlebitis      X 2, 1999, 2000  . Ocular hypertension     glaucoma suspect, Dr. Kathrin Rollins  . Skin cancer   . Endometriosis   . Gastritis 2006    @ [facility]  . Hiatal hernia   . Diverticulosis     Dr Henrene Pastor  . GERD (gastroesophageal reflux disease)   . DVT (deep venous thrombosis) 2000    no trigger  . Syncope 11/12/11    in context CAP . Carotid Doppler exam was negative. 2D echocardiogram revealed mild dilation of the left  atrium and moderate increase in the systolic pressureof  the pulmonary artery  . Atrial fibrillation     Post op after repair of ankle fracture  . UTI (lower urinary tract infection) 01/2013    Strep bovis ; PCN sensitive  . IBS (irritable bowel syndrome)     Past Surgical History  Procedure Laterality Date  . Total abdominal hysterectomy w/ bilateral salpingoophorectomy  1980    dysfunctional menses, endometriosis  . Varicose vein surgery      1960, 1965, 2009  . Tonsillectomy    . Cataract extraction w/ intraocular lens implant      OS; Dr Valerie Rollins  . Colonoscopy  1995, 2002, 2012    diverticulosis; Dr Henrene Pastor  . Orif ankle fracture  11/14/2011    Procedure: OPEN REDUCTION INTERNAL FIXATION (ORIF) ANKLE FRACTURE;  Surgeon: Valerie Rhein;  Location: [facility];  Service: Orthopedics;  Laterality: Left;  open reduction internal fixation trimalleolar ankle fracture  . Cardiac catheterization N/A 03/28/2015    Procedure: Left Heart Cath and Coronary Angiography;  Surgeon: Valerie Artist, MD;  Location: [facility] INVASIVE CV LAB CUPID;  Service: Cardiovascular;  Laterality: N/A;  . Coronary artery bypass graft N/A 04/01/2015    Procedure: CORONARY ARTERY BYPASS GRAFTING (CABG), ON PUMP, TIMES ONE, USING LEFT  INTERNAL MAMMARY ARTERY;  Surgeon: Valerie Poot, MD;  Location: [place];  Service: Open Heart Surgery;  Laterality: N/A;  . Excision of atrial myxoma N/A 04/01/2015    Procedure: EXCISION OF ATRIAL MYXOMA;  Surgeon: Valerie Poot, MD;  Location: [place];  Service: Open Heart Surgery;  Laterality: N/A;  . Tee without cardioversion N/A 04/01/2015    Procedure: TRANSESOPHAGEAL ECHOCARDIOGRAM (TEE);  Surgeon: Valerie Poot, MD;  Location: [place];  Service: Open Heart Surgery;  Laterality: N/A;     Medications: Current Outpatient Prescriptions  Medication Sig Dispense Refill  . aspirin EC 81 MG tablet Take 81 mg by mouth daily.    Marland Kitchen atorvastatin (LIPITOR) 10 MG tablet Take 1 tablet (10 mg  total) by mouth daily at 6 PM. 30 tablet 1  . calcium citrate-vitamin D (CITRACAL+D) 315-200 MG-UNIT per tablet Take 2 tablets by mouth 2 (two) times daily.    . Cholecalciferol (VITAMIN D3) 1000 UNITS CAPS Take 1,000 Units by mouth daily.     Marland Kitchen docusate sodium (COLACE) 100 MG capsule Take 100 mg by mouth daily.    . fexofenadine (ALLEGRA) 180 MG tablet Take 180 mg by mouth as needed.     . furosemide (LASIX) 40 MG tablet Take 1 tablet (40 mg total) by mouth daily. For 4 days then stop. (Patient taking differently: Take 20 mg by mouth daily. ) 4 tablet 0  . latanoprost (XALATAN) 0.005 % ophthalmic solution Place 1 drop into both eyes daily.      Marland Kitchen losartan (COZAAR) 25 MG tablet Take 1 tablet (25 mg total) by mouth daily. 30 tablet 1  . Metoprolol Tartrate 37.5 MG TABS Take 37.5 mg by mouth 2 (two) times daily. 60 tablet 1  . montelukast (SINGULAIR) 10 MG tablet Take 10 mg by mouth at bedtime.  1  . Multiple Vitamin (MULITIVITAMIN WITH MINERALS) TABS Take 1 tablet by mouth daily.    Marland Kitchen omeprazole (PRILOSEC) 20 MG capsule TAKE ONE CAPSULE BY MOUTH ONE TIME DAILY 90 capsule 3  . traMADol (ULTRAM) 50 MG tablet Take 1 tablet (50 mg total) by mouth every 6 (six) hours as needed. ValerieRamos 30 tablet 0  . warfarin (COUMADIN) 5 MG tablet Take 1 tablet (5 mg total) by mouth daily. Or as directed. 30 tablet 1   Current Facility-Administered Medications  Medication Dose Route Frequency Provider Last Rate Last Dose  . pneumococcal 13-valent conjugate vaccine (PREVNAR 13) injection 0.5 mL  0.5 mL Intramuscular Tomorrow-1000 Hendricks Limes, MD        Allergies: Allergies  Allergen Reactions  . Oxybutynin Chloride     Tongue swelling; Rx from Dr Hessie Rollins  . Propoxyphene N-Acetaminophen     REACTION: made pt blood to thin  . Alendronate Sodium     ? reaction  . Ramipril     REACTION: cough    Social History: The patient  reports that she has never smoked. She has never used smokeless tobacco. She  reports that she does not drink alcohol or use illicit drugs.   Family History: The patient's family history includes Alzheimer's disease in her mother; Colon cancer in her maternal grandmother; Diverticulitis in her daughter; Heart attack (age of onset: 63) in her father; Heart attack (age of onset: 63) in her paternal grandmother; Hyperlipidemia in her brother; Hypertension in her brother. There is no history of Diabetes or Stroke.   Review of Systems: Please see the history of present illness.   Otherwise, the review  of systems is positive for none.   All other systems are reviewed and negative.   Physical Exam: VS:  BP 160/100 mmHg  Pulse 57  Ht 5' 5.5" (1.664 m)  Wt 180 lb 1.9 oz (81.702 kg)  BMI 29.51 kg/m2 .  BMI Body mass index is 29.51 kg/(m^2).  Wt Readings from Last 3 Encounters:  05/30/15 180 lb 1.9 oz (81.702 kg)  05/21/15 182 lb (82.555 kg)  04/29/15 177 lb 6.4 oz (80.468 kg)    General: Pleasant. Well developed, well nourished and in no acute distress.  HEENT: Normal. Neck: Supple, no JVD, carotid bruits, or masses noted.  Cardiac: Regular rate and rhythm. No murmurs, rubs, or gallops. No edema.  Respiratory:  Lungs are clear to auscultation bilaterally with normal work of breathing.  GI: Soft and nontender.  MS: No deformity or atrophy. Gait and ROM intact. Skin: Warm and dry. Color is normal.  Neuro:  Strength and sensation are intact and no gross focal deficits noted.  Psych: Alert, appropriate and with normal affect.   LABORATORY DATA:  EKG:  EKG is ordered today. This demonstrates sinus brady with RBBB.  Lab Results  Component Value Date   WBC 6.5 04/29/2015   HGB 11.5* 04/29/2015   HCT 35.6* 04/29/2015   PLT 293.0 04/29/2015   GLUCOSE 108* 04/29/2015   CHOL 205* 02/26/2015   TRIG 102 02/26/2015   HDL 65 02/26/2015   LDLDIRECT 152.6 07/31/2013   LDLCALC 120* 02/26/2015   ALT 71* 05/22/2015   AST 48* 05/22/2015   NA 141 04/29/2015   K 4.0  04/29/2015   CL 104 04/29/2015   CREATININE 1.04 04/29/2015   BUN 20 04/29/2015   CO2 30 04/29/2015   TSH 3.90 04/29/2015   INR 1.8 05/29/2015   HGBA1C 5.8* 03/31/2015    Lab Results  Component Value Date   INR 1.8 05/29/2015   INR 1.8 05/14/2015   INR 3.0 05/07/2015     BNP (last 3 results) No results for input(s): BNP in the last 8760 hours.  ProBNP (last 3 results) No results for input(s): PROBNP in the last 8760 hours.   Other Studies Reviewed Today:   Assessment/Plan: 1. Post op myxoma removal with CABG x 1 - doing well clinically. Will recheck Echo.   2. HTN - better control at home - will continue to monitor. Repeat by me is down to 120/80 - I have left her on her current regimen.   3. PAF - on amiodarone - stopping today - has had some elevation in LFT's. If she were to have recurrence would favor different AAD.  4. Chronic anticoagulation - on Coumadin. She is felt to need life long anticoagulation - CHADSVASC is 5 with a 6.7% annual risk of stroke (female, age, HTN, vascular dx). Coumadin checked by PCP. Would consider changing to NOAC if ok with Dr. Darcey Nora.   5. Elevated LFTs - rechecking today. Stopping amiodarone today.   6. HLD - on statin therapy   Current medicines are reviewed with the patient today.  The patient does not have concerns regarding medicines other than what has been noted above.  The following changes have been made:  See above.  Labs/ tests ordered today include:    Orders Placed This Encounter  Procedures  . Hepatic function panel  . EKG 12-Lead  . ECHOCARDIOGRAM COMPLETE     Disposition:   FU with Dr. Stanford Rollins in August as planned.    Patient is agreeable to this  plan and will call if any problems develop in the interim.   Signed: Burtis Junes, RN, ANP-C 05/30/2015 10:33 AM  [facility] [address] [place], [place]  [address] Phone: [phone] Fax: [phone]         Addendum from Dr. Prescott Gum - 06/06/2015  She looks good     RV dilatation and TR should improve with relief of mitral valve obstruction by large myxoma    Safe to use NOAC now instaed of coumadin if she needs longterm anticoagulation    PVT

## 2015-06-01 ENCOUNTER — Other Ambulatory Visit: Payer: Self-pay | Admitting: Physician Assistant

## 2015-06-02 ENCOUNTER — Other Ambulatory Visit: Payer: Self-pay | Admitting: Cardiology

## 2015-06-02 ENCOUNTER — Encounter (HOSPITAL_COMMUNITY): Payer: PPO

## 2015-06-02 ENCOUNTER — Other Ambulatory Visit: Payer: Self-pay

## 2015-06-02 ENCOUNTER — Telehealth: Payer: Self-pay | Admitting: General Practice

## 2015-06-02 MED ORDER — ATORVASTATIN CALCIUM 10 MG PO TABS: 10.0000 mg | ORAL_TABLET | Freq: Every day | ORAL | Status: DC

## 2015-06-02 NOTE — Telephone Encounter (Signed)
Spoke with patient and scheduled appointment for pt/inr next Monday 7/18.  Patient stopped amiodarone on 7/7.

## 2015-06-03 ENCOUNTER — Other Ambulatory Visit: Payer: Self-pay | Admitting: Cardiothoracic Surgery

## 2015-06-03 ENCOUNTER — Other Ambulatory Visit (HOSPITAL_COMMUNITY): Payer: PPO

## 2015-06-03 ENCOUNTER — Ambulatory Visit (HOSPITAL_COMMUNITY)
Admission: RE | Admit: 2015-06-03 | Discharge: 2015-06-03 | Disposition: A | Discharge status: Home / Self Care | Payer: PPO | Source: Ambulatory Visit | Attending: Nurse Practitioner | Admitting: Nurse Practitioner

## 2015-06-03 DIAGNOSIS — I340 Nonrheumatic mitral (valve) insufficiency: Secondary | ICD-10-CM | POA: Diagnosis not present

## 2015-06-03 DIAGNOSIS — I2510 Atherosclerotic heart disease of native coronary artery without angina pectoris: Secondary | ICD-10-CM | POA: Diagnosis present

## 2015-06-03 DIAGNOSIS — I4891 Unspecified atrial fibrillation: Secondary | ICD-10-CM

## 2015-06-03 DIAGNOSIS — I517 Cardiomegaly: Secondary | ICD-10-CM | POA: Diagnosis not present

## 2015-06-03 DIAGNOSIS — I071 Rheumatic tricuspid insufficiency: Secondary | ICD-10-CM | POA: Diagnosis not present

## 2015-06-03 DIAGNOSIS — Z951 Presence of aortocoronary bypass graft: Secondary | ICD-10-CM

## 2015-06-03 NOTE — Progress Notes (Signed)
  Echocardiogram 2D Echocardiogram has been performed.  Darlina Sicilian M 06/03/2015, 3:21 PM

## 2015-06-04 ENCOUNTER — Encounter (HOSPITAL_COMMUNITY): Payer: PPO

## 2015-06-04 ENCOUNTER — Ambulatory Visit (INDEPENDENT_AMBULATORY_CARE_PROVIDER_SITE_OTHER): Payer: Self-pay | Admitting: Cardiothoracic Surgery

## 2015-06-04 ENCOUNTER — Encounter: Payer: Self-pay | Admitting: Cardiothoracic Surgery

## 2015-06-04 ENCOUNTER — Ambulatory Visit
Admission: RE | Admit: 2015-06-04 | Discharge: 2015-06-04 | Disposition: A | Discharge status: Home / Self Care | Payer: PPO | Source: Ambulatory Visit | Attending: Cardiothoracic Surgery | Admitting: Cardiothoracic Surgery

## 2015-06-04 VITALS — BP 121/75 | HR 65 | Resp 16 | Ht 65.5 in | Wt 177.4 lb

## 2015-06-04 DIAGNOSIS — I2510 Atherosclerotic heart disease of native coronary artery without angina pectoris: Secondary | ICD-10-CM

## 2015-06-04 DIAGNOSIS — Z951 Presence of aortocoronary bypass graft: Secondary | ICD-10-CM

## 2015-06-04 DIAGNOSIS — D219 Benign neoplasm of connective and other soft tissue, unspecified: Secondary | ICD-10-CM

## 2015-06-04 NOTE — Progress Notes (Signed)
PCP is Unice Cobble, MD Referring Provider is Lelon Perla, MD  Chief Complaint  Patient presents with  . 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 She is currently on Coumadin dose by clinical pharmacist. She currently is in a sinus rhythm with improving strength and exercise tolerance. She'll be starting cardiac rehabilitation in approximately one-2 weeks. She has no orthopnea or PND. She does have some bilateral ankle swelling related to her chronic venous insufficiency. The surgical incision is well-healed. The patient had a chest x-ray today which shows some left-sided mild atelectasis but no significant effusion. She's had a previous CT scan of the chest after her previous visit because of some pleuritic left-sided chest pain which was negative for PE and showed normal healing of the sternotomy and no pericardial effusion.  Yesterday the patient had a echocardiogram ordered by the cardiology office which showed normal LV function, 1+ mitral regurgitation, no evidence of atrial myxoma and no pericardial effusion. The patient has moderate RV dilatation from probable pulmonary hypertension related to the atrial myxoma obstruction of the mitral valve and mitral stenosis type physiology. There is mild-moderate tricuspid regurgitation is also functional and should improve with time after relief of . the mitral valve obstruction from atrial myxoma.   Past Medical History  Diagnosis Date  . Diverticulitis 2002    based on CT scan  . Hyperlipidemia   . Hypertension   . Superficial phlebitis      X 2, 1999, 2000  . Ocular hypertension     glaucoma suspect, Dr. Kathrin Penner  . Skin cancer   . Endometriosis   . Gastritis 2006    @ [facility]  . Hiatal hernia   . Diverticulosis     Dr Henrene Pastor  . GERD  (gastroesophageal reflux disease)   . DVT (deep venous thrombosis) 2000    no trigger  . Syncope 11/12/11    in context CAP . Carotid Doppler exam was negative. 2D echocardiogram revealed mild dilation of the left atrium and moderate increase in the systolic pressureof  the pulmonary artery  . Atrial fibrillation     Post op after repair of ankle fracture  . UTI (lower urinary tract infection) 01/2013    Strep bovis ; PCN sensitive  . IBS (irritable bowel syndrome)     Past Surgical History  Procedure Laterality Date  . Total abdominal hysterectomy w/ bilateral salpingoophorectomy  1980    dysfunctional menses, endometriosis  . Varicose vein surgery      1960, 1965, 2009  . Tonsillectomy    . Cataract extraction w/ intraocular lens implant      OS; Dr Kathrin Penner  . Colonoscopy  1995, 2002, 2012    diverticulosis; Dr Henrene Pastor  . Orif ankle fracture  11/14/2011    Procedure: OPEN REDUCTION INTERNAL FIXATION (ORIF) ANKLE FRACTURE;  Surgeon: Colin Rhein;  Location: [facility];  Service: Orthopedics;  Laterality: Left;  open reduction internal fixation trimalleolar ankle fracture  . Cardiac catheterization N/A 03/28/2015    Procedure: Left Heart Cath and Coronary Angiography;  Surgeon: Jolaine Artist, MD;  Location: [facility] INVASIVE CV LAB CUPID;  Service: Cardiovascular;  Laterality: N/A;  . Coronary artery bypass graft N/A 04/01/2015    Procedure: CORONARY ARTERY BYPASS GRAFTING (CABG), ON PUMP, TIMES  ONE, USING LEFT INTERNAL MAMMARY ARTERY;  Surgeon: Ivin Poot, MD;  Location: [place];  Service: Open Heart Surgery;  Laterality: N/A;  . Excision of atrial myxoma N/A 04/01/2015    Procedure: EXCISION OF ATRIAL MYXOMA;  Surgeon: Ivin Poot, MD;  Location: [place];  Service: Open Heart Surgery;  Laterality: N/A;  . Tee without cardioversion N/A 04/01/2015    Procedure: TRANSESOPHAGEAL ECHOCARDIOGRAM (TEE);  Surgeon: Ivin Poot, MD;  Location: [place];  Service: Open Heart Surgery;   Laterality: N/A;    Family History  Problem Relation Age of Onset  . Heart attack Father 104  . Alzheimer's disease Mother     TIAs  . Hyperlipidemia Brother   . Hypertension Brother   . Diverticulitis Daughter     S/P colectomy  . Colon cancer Maternal Grandmother   . Heart attack Paternal Grandmother 82  . Diabetes Neg Hx   . Stroke Neg Hx     Social History History  Substance Use Topics  . Smoking status: Never Smoker   . Smokeless tobacco: Never Used  . Alcohol Use: No    Current Outpatient Prescriptions  Medication Sig Dispense Refill  . aspirin EC 81 MG tablet Take 81 mg by mouth daily.    Marland Kitchen atorvastatin (LIPITOR) 10 MG tablet Take 1 tablet (10 mg total) by mouth daily at 6 PM. 30 tablet 11  . calcium citrate-vitamin D (CITRACAL+D) 315-200 MG-UNIT per tablet Take 2 tablets by mouth 2 (two) times daily.    . Cholecalciferol (VITAMIN D3) 1000 UNITS CAPS Take 1,000 Units by mouth daily.     Marland Kitchen docusate sodium (COLACE) 100 MG capsule Take 100 mg by mouth daily.    . furosemide (LASIX) 20 MG tablet Take 20 mg by mouth daily.    Marland Kitchen latanoprost (XALATAN) 0.005 % ophthalmic solution Place 1 drop into both eyes daily.      Marland Kitchen losartan (COZAAR) 25 MG tablet Take 1 tablet (25 mg total) by mouth daily. 30 tablet 1  . Metoprolol Tartrate 37.5 MG TABS Take 37.5 mg by mouth 2 (two) times daily. 60 tablet 1  . montelukast (SINGULAIR) 10 MG tablet Take 10 mg by mouth at bedtime.  1  . Multiple Vitamin (MULITIVITAMIN WITH MINERALS) TABS Take 1 tablet by mouth daily.    . traMADol (ULTRAM) 50 MG tablet Take 1 tablet (50 mg total) by mouth every 6 (six) hours as needed. Dr.Ramos 30 tablet 0  . warfarin (COUMADIN) 5 MG tablet Take 1 tablet (5 mg total) by mouth daily. Or as directed. 30 tablet 1  . fexofenadine (ALLEGRA) 180 MG tablet Take 180 mg by mouth as needed.     Marland Kitchen omeprazole (PRILOSEC) 20 MG capsule TAKE ONE CAPSULE BY MOUTH ONE TIME DAILY 90 capsule 3   Current Facility-Administered  Medications  Medication Dose Route Frequency Provider Last Rate Last Dose  . pneumococcal 13-valent conjugate vaccine (PREVNAR 13) injection 0.5 mL  0.5 mL Intramuscular Tomorrow-1000 Hendricks Limes, MD        Allergies  Allergen Reactions  . Oxybutynin Chloride     Tongue swelling; Rx from Dr Hessie Diener  . Propoxyphene N-Acetaminophen     REACTION: made pt blood to thin  . Alendronate Sodium     ? reaction  . Ramipril     REACTION: cough    Review of Systems   No bleeding complications from the Coumadin Sternal incision healing well Improved left flank pleuritic pain BP 121/75 mmHg  Pulse 65  Resp 16  Ht 5' 5.5" (1.664 m)  Wt 177 lb 6.4 oz (80.468 kg)  BMI 29.06 kg/m2  SpO2 97% Physical Exam Alert and comfortable Lungs clear Soft 2/6 holosystolic murmur from probable TR Heart rate regular without gallop Sternum stable well-healed 2+ ankle edema bilaterally with evidence of venous insufficiency  Diagnostic Tests: Chest x-ray today fairly clear with some mild mid left lung healed atelectatic changes  Impression: Doing well after resection of atrial myxoma Continue Coumadin. She was given refill prescriptions for metoprolol and Coumadin and losartan  Plan:she'll be following up with Dr. Stanford Breed her primary cardiologist next month. Shewill return here as needed.   Len Childs, MD [facility] [phone]

## 2015-06-06 ENCOUNTER — Encounter (HOSPITAL_COMMUNITY): Payer: PPO

## 2015-06-06 ENCOUNTER — Telehealth: Payer: Self-pay | Admitting: *Deleted

## 2015-06-06 NOTE — Telephone Encounter (Signed)
Just tell her to wait til she sees Dr. Stanford Breed.

## 2015-06-06 NOTE — Telephone Encounter (Signed)
-----   Message from Burtis Junes, NP sent at 06/06/2015  9:53 AM EDT ----- Can you call her and let her know that I heard from Dr. Darcey Nora - he has said that it would be ok for her to switch her Coumadin to NOAC (Eliquis, Xarelto or Pradaxa)  She can discuss this with the coumadin clinic on Monday and proceed with changing or wait til her follow up with Dr. Stanford Breed.  Valerie Rollins

## 2015-06-06 NOTE — Telephone Encounter (Signed)
Pt stated that is not what Dr. Nils Pyle told pt.  Stated to stay on coumadin till pt see's Dr. Stanford Breed, will call back to let pt know. Will send to Truitt Merle, NP to [facility].

## 2015-06-06 NOTE — Telephone Encounter (Signed)
Pt aware and agreeable to Cecille Rubin will wait till pt see's Dr.Crenshaw

## 2015-06-07 ENCOUNTER — Other Ambulatory Visit: Payer: Self-pay | Admitting: Physician Assistant

## 2015-06-09 ENCOUNTER — Other Ambulatory Visit: Payer: Self-pay | Admitting: Cardiology

## 2015-06-09 ENCOUNTER — Encounter (HOSPITAL_COMMUNITY): Payer: PPO

## 2015-06-09 ENCOUNTER — Ambulatory Visit (INDEPENDENT_AMBULATORY_CARE_PROVIDER_SITE_OTHER): Payer: PPO | Admitting: General Practice

## 2015-06-09 DIAGNOSIS — Z7901 Long term (current) use of anticoagulants: Secondary | ICD-10-CM | POA: Diagnosis not present

## 2015-06-09 DIAGNOSIS — I4891 Unspecified atrial fibrillation: Secondary | ICD-10-CM

## 2015-06-09 DIAGNOSIS — I480 Paroxysmal atrial fibrillation: Secondary | ICD-10-CM | POA: Diagnosis not present

## 2015-06-09 DIAGNOSIS — Z5181 Encounter for therapeutic drug level monitoring: Secondary | ICD-10-CM

## 2015-06-09 LAB — POCT INR: INR: 2.9

## 2015-06-09 NOTE — Progress Notes (Signed)
Pre visit review using our clinic review tool, if applicable. No additional management support is needed unless otherwise documented below in the visit note. 

## 2015-06-10 ENCOUNTER — Other Ambulatory Visit: Payer: Self-pay | Admitting: Emergency Medicine

## 2015-06-10 MED ORDER — MONTELUKAST SODIUM 10 MG PO TABS: 10.0000 mg | ORAL_TABLET | Freq: Every day | ORAL | Status: DC

## 2015-06-11 ENCOUNTER — Encounter (HOSPITAL_COMMUNITY): Payer: PPO

## 2015-06-13 ENCOUNTER — Encounter (HOSPITAL_COMMUNITY): Payer: PPO

## 2015-06-16 ENCOUNTER — Encounter (HOSPITAL_COMMUNITY): Payer: PPO

## 2015-06-18 ENCOUNTER — Encounter (HOSPITAL_COMMUNITY): Payer: PPO

## 2015-06-19 ENCOUNTER — Encounter (HOSPITAL_COMMUNITY)
Admission: RE | Admit: 2015-06-19 | Discharge: 2015-06-19 | Disposition: A | Discharge status: Home / Self Care | Payer: PPO | Source: Ambulatory Visit | Attending: Cardiology | Admitting: Cardiology

## 2015-06-19 ENCOUNTER — Ambulatory Visit: Payer: PPO

## 2015-06-19 NOTE — Progress Notes (Signed)
Cardiac Rehab Medication Review by a Pharmacist  Does the patient  feel that his/her medications are working for him/her?  yes  Has the patient been experiencing any side effects to the medications prescribed?  no  Does the patient measure his/her own blood pressure or blood glucose at home?  yes   Does the patient have any problems obtaining medications due to transportation or finances?   no  Understanding of regimen: good Understanding of indications: good Potential of compliance: good    Pharmacist comments: Pt has a good understanding of her medication regimen and what the medications are used for. I updated her list to include a Combivent inhaler that she uses PRN. She has not noticed any side effects and has no problems affording her medications.   Linda Hedges, PharmD Clinical Pharmacy Resident Pager: [phone] 06/19/2015 8:23 AM

## 2015-06-20 ENCOUNTER — Encounter (HOSPITAL_COMMUNITY): Payer: PPO

## 2015-06-23 ENCOUNTER — Encounter (HOSPITAL_COMMUNITY)
Admission: RE | Admit: 2015-06-23 | Discharge: 2015-06-23 | Disposition: A | Discharge status: Home / Self Care | Payer: PPO | Source: Ambulatory Visit | Attending: Cardiology | Admitting: Cardiology

## 2015-06-23 ENCOUNTER — Encounter (HOSPITAL_COMMUNITY): Payer: PPO

## 2015-06-23 ENCOUNTER — Encounter (HOSPITAL_COMMUNITY): Payer: Self-pay

## 2015-06-23 DIAGNOSIS — Z951 Presence of aortocoronary bypass graft: Secondary | ICD-10-CM | POA: Diagnosis not present

## 2015-06-23 NOTE — Progress Notes (Signed)
Pt started cardiac rehab today.  Pt tolerated light exercise without difficulty. VSS, telemetry-sinus rhythm, rare sinus arrhythmia, asymptomatic.  Medication list reconciled.  Pt verbalized compliance with medications and denies barriers to compliance. PSYCHOSOCIAL ASSESSMENT:  PHQ-0. Pt exhibits positive coping skills, hopeful outlook with supportive family. No psychosocial needs identified at this time, no psychosocial interventions necessary.    Pt enjoys being home with her retired husband.  Pt reports feelings of gratitude that  her myxoma was identified and treated before causing significant health concerns.    Pt cardiac rehab  goal is  to lose weight and change her eating habits.  Pt encouraged to participate in home exercise with nutrition and education classes to increase ability to achieve these goals.   Pt plans to attend "Heart Healthy eating" nutrition class tomorrow.    Pt oriented to exercise equipment and routine.  Understanding verbalized

## 2015-06-25 ENCOUNTER — Encounter (HOSPITAL_COMMUNITY)
Admission: RE | Admit: 2015-06-25 | Discharge: 2015-06-25 | Disposition: A | Discharge status: Home / Self Care | Payer: PPO | Source: Ambulatory Visit | Attending: Cardiology | Admitting: Cardiology

## 2015-06-25 ENCOUNTER — Encounter (HOSPITAL_COMMUNITY): Payer: PPO

## 2015-06-25 DIAGNOSIS — Z951 Presence of aortocoronary bypass graft: Secondary | ICD-10-CM | POA: Diagnosis not present

## 2015-06-27 ENCOUNTER — Encounter (HOSPITAL_COMMUNITY): Payer: PPO

## 2015-06-27 ENCOUNTER — Encounter (HOSPITAL_COMMUNITY)
Admission: RE | Admit: 2015-06-27 | Discharge: 2015-06-27 | Disposition: A | Discharge status: Home / Self Care | Payer: PPO | Source: Ambulatory Visit | Attending: Cardiology | Admitting: Cardiology

## 2015-06-27 DIAGNOSIS — Z951 Presence of aortocoronary bypass graft: Secondary | ICD-10-CM | POA: Diagnosis not present

## 2015-06-29 ENCOUNTER — Other Ambulatory Visit: Payer: Self-pay | Admitting: Cardiothoracic Surgery

## 2015-06-30 ENCOUNTER — Encounter (HOSPITAL_COMMUNITY)
Admission: RE | Admit: 2015-06-30 | Discharge: 2015-06-30 | Disposition: A | Discharge status: Home / Self Care | Payer: PPO | Source: Ambulatory Visit | Attending: Cardiology | Admitting: Cardiology

## 2015-06-30 ENCOUNTER — Other Ambulatory Visit: Payer: Self-pay | Admitting: Cardiology

## 2015-06-30 ENCOUNTER — Encounter (HOSPITAL_COMMUNITY): Payer: PPO

## 2015-06-30 DIAGNOSIS — Z951 Presence of aortocoronary bypass graft: Secondary | ICD-10-CM | POA: Diagnosis not present

## 2015-07-01 ENCOUNTER — Other Ambulatory Visit: Payer: Self-pay | Admitting: General Practice

## 2015-07-01 MED ORDER — WARFARIN SODIUM 5 MG PO TABS: 5.0000 mg | ORAL_TABLET | ORAL | Status: DC

## 2015-07-02 ENCOUNTER — Encounter (HOSPITAL_COMMUNITY): Payer: PPO

## 2015-07-02 ENCOUNTER — Encounter (HOSPITAL_COMMUNITY)
Admission: RE | Admit: 2015-07-02 | Discharge: 2015-07-02 | Disposition: A | Discharge status: Home / Self Care | Payer: PPO | Source: Ambulatory Visit | Attending: Cardiology | Admitting: Cardiology

## 2015-07-02 DIAGNOSIS — Z951 Presence of aortocoronary bypass graft: Secondary | ICD-10-CM | POA: Diagnosis not present

## 2015-07-04 ENCOUNTER — Encounter (HOSPITAL_COMMUNITY)
Admission: RE | Admit: 2015-07-04 | Discharge: 2015-07-04 | Disposition: A | Discharge status: Home / Self Care | Payer: PPO | Source: Ambulatory Visit | Attending: Cardiology | Admitting: Cardiology

## 2015-07-04 ENCOUNTER — Encounter (HOSPITAL_COMMUNITY): Payer: PPO

## 2015-07-04 DIAGNOSIS — Z951 Presence of aortocoronary bypass graft: Secondary | ICD-10-CM | POA: Diagnosis not present

## 2015-07-07 ENCOUNTER — Encounter (HOSPITAL_COMMUNITY): Payer: PPO

## 2015-07-07 ENCOUNTER — Ambulatory Visit (INDEPENDENT_AMBULATORY_CARE_PROVIDER_SITE_OTHER): Payer: PPO | Admitting: Family

## 2015-07-07 ENCOUNTER — Encounter (HOSPITAL_COMMUNITY)
Admission: RE | Admit: 2015-07-07 | Discharge: 2015-07-07 | Disposition: A | Discharge status: Home / Self Care | Payer: PPO | Source: Ambulatory Visit | Attending: Cardiology | Admitting: Cardiology

## 2015-07-07 ENCOUNTER — Ambulatory Visit: Payer: PPO

## 2015-07-07 ENCOUNTER — Encounter: Payer: Self-pay | Admitting: Family

## 2015-07-07 ENCOUNTER — Other Ambulatory Visit (INDEPENDENT_AMBULATORY_CARE_PROVIDER_SITE_OTHER): Payer: PPO

## 2015-07-07 VITALS — BP 136/86 | HR 82 | Temp 97.9°F | Resp 18 | Ht 65.5 in | Wt 180.0 lb

## 2015-07-07 DIAGNOSIS — R221 Localized swelling, mass and lump, neck: Secondary | ICD-10-CM

## 2015-07-07 DIAGNOSIS — K589 Irritable bowel syndrome without diarrhea: Secondary | ICD-10-CM

## 2015-07-07 DIAGNOSIS — Z951 Presence of aortocoronary bypass graft: Secondary | ICD-10-CM | POA: Diagnosis not present

## 2015-07-07 LAB — CBC WITH DIFFERENTIAL/PLATELET
BASOS PCT: 0.7 % (ref 0.0–3.0)
Basophils Absolute: 0 10*3/uL (ref 0.0–0.1)
EOS PCT: 4.2 % (ref 0.0–5.0)
Eosinophils Absolute: 0.2 10*3/uL (ref 0.0–0.7)
HCT: 38.5 % (ref 36.0–46.0)
Hemoglobin: 12.4 g/dL (ref 12.0–15.0)
LYMPHS ABS: 1.8 10*3/uL (ref 0.7–4.0)
Lymphocytes Relative: 32.1 % (ref 12.0–46.0)
MCHC: 32.2 g/dL (ref 30.0–36.0)
MCV: 84 fl (ref 78.0–100.0)
MONO ABS: 0.7 10*3/uL (ref 0.1–1.0)
MONOS PCT: 11.3 % (ref 3.0–12.0)
NEUTROS ABS: 3 10*3/uL (ref 1.4–7.7)
NEUTROS PCT: 51.7 % (ref 43.0–77.0)
PLATELETS: 249 10*3/uL (ref 150.0–400.0)
RBC: 4.58 Mil/uL (ref 3.87–5.11)
RDW: 14.6 % (ref 11.5–15.5)
WBC: 5.8 10*3/uL (ref 4.0–10.5)

## 2015-07-07 LAB — TSH: TSH: 4.39 u[IU]/mL (ref 0.35–4.50)

## 2015-07-07 MED ORDER — DICYCLOMINE HCL 20 MG PO TABS: 20.0000 mg | ORAL_TABLET | Freq: Four times a day (QID) | ORAL | Status: DC

## 2015-07-07 NOTE — Progress Notes (Signed)
Subjective:    Patient ID: Valerie Rollins, female    DOB: [birth year], 79 y.o.   MRN: [id number]  Chief Complaint  Patient presents with  . Neck Pain    has a knot on the right side of her neck, at first thought it was related to drainage, just wanted to make sure it was nothing serious    HPI:  Valerie Rollins is a 79 y.o. female with a PMH of right bundle branch block, IBS, hypertension, GERD, diverticulosis, degenerative disc disease, and atrial fibrillation who presents today for an acute office visit.    1.) Knot in neck - This is a new problem. Associated symptom of a knot located on the right side of her neck around the area of her sternocleidomastoid has been going on for about 1-2 days. She did have a sore throat on Saturday that has continued with a little improvement. Denies problems breathing or swallowing. Denies any modifying factors that make it better or worse.   2.) Stomach cramps - Associated symptoms of stomach cramps that were previously treated with dicyclomine have returned slightly and would like to get a refill of the medication .    Allergies  Allergen Reactions  . Oxybutynin Chloride     Tongue swelling; Rx from Dr Hessie Diener  . Propoxyphene N-Acetaminophen     REACTION: made pt blood to thin  . Alendronate Sodium     ? reaction  . Ramipril     REACTION: cough    Current Outpatient Prescriptions on File Prior to Visit  Medication Sig Dispense Refill  . albuterol-ipratropium (COMBIVENT) 18-103 MCG/ACT inhaler Inhale 1 puff into the lungs every 4 (four) hours. Pt uses 1 puff PRN, doesn't use daily    . aspirin EC 81 MG tablet Take 81 mg by mouth daily.    Marland Kitchen atorvastatin (LIPITOR) 10 MG tablet Take 1 tablet (10 mg total) by mouth daily at 6 PM. 30 tablet 11  . calcium citrate-vitamin D (CITRACAL+D) 315-200 MG-UNIT per tablet Take 2 tablets by mouth 2 (two) times daily.    . Cholecalciferol (VITAMIN D3) 1000 UNITS CAPS Take 1,000 Units by mouth daily.     Marland Kitchen  docusate sodium (COLACE) 100 MG capsule Take 100 mg by mouth daily.    . fexofenadine (ALLEGRA) 180 MG tablet Take 180 mg by mouth daily. Pt takes daily    . furosemide (LASIX) 20 MG tablet Take 20 mg by mouth daily.    Marland Kitchen latanoprost (XALATAN) 0.005 % ophthalmic solution Place 1 drop into both eyes daily.      Marland Kitchen losartan (COZAAR) 25 MG tablet TAKE 1 TABLET BY MOUTH EVERY DAY 30 tablet 0  . metoprolol tartrate (LOPRESSOR) 25 MG tablet TAKE 1 & 1/2 TABLETS BY MOUTH TWICE A DAY 90 tablet 0  . montelukast (SINGULAIR) 10 MG tablet Take 1 tablet (10 mg total) by mouth at bedtime. 90 tablet 1  . Multiple Vitamin (MULITIVITAMIN WITH MINERALS) TABS Take 1 tablet by mouth daily.    Marland Kitchen omeprazole (PRILOSEC) 20 MG capsule TAKE ONE CAPSULE BY MOUTH ONE TIME DAILY 90 capsule 3  . traMADol (ULTRAM) 50 MG tablet Take 1 tablet (50 mg total) by mouth every 6 (six) hours as needed. Dr.Ramos (Patient taking differently: Take 50 mg by mouth every 6 (six) hours as needed. Dr.Ramos; pt takes 1 tablet every night, maybe 1 during the day as needed) 30 tablet 0  . warfarin (COUMADIN) 5 MG tablet Take 1 tablet (5  mg total) by mouth See admin instructions. 30 tablet 3   Current Facility-Administered Medications on File Prior to Visit  Medication Dose Route Frequency Provider Last Rate Last Dose  . pneumococcal 13-valent conjugate vaccine (PREVNAR 13) injection 0.5 mL  0.5 mL Intramuscular Tomorrow-1000 Hendricks Limes, MD        Past Medical History  Diagnosis Date  . Diverticulitis 2002    based on CT scan  . Hyperlipidemia   . Hypertension   . Superficial phlebitis      X 2, 1999, 2000  . Ocular hypertension     glaucoma suspect, Dr. Kathrin Penner  . Skin cancer   . Endometriosis   . Gastritis 2006    @ Endo  . Hiatal hernia   . Diverticulosis     Dr Henrene Pastor  . GERD (gastroesophageal reflux disease)   . DVT (deep venous thrombosis) 2000    no trigger  . Syncope 11/12/11    in context CAP . Carotid Doppler  exam was negative. 2D echocardiogram revealed mild dilation of the left atrium and moderate increase in the systolic pressureof  the pulmonary artery  . Atrial fibrillation     Post op after repair of ankle fracture  . UTI (lower urinary tract infection) 01/2013    Strep bovis ; PCN sensitive  . IBS (irritable bowel syndrome)     Review of Systems  Constitutional: Negative for fever, chills and unexpected weight change.  HENT: Positive for sore throat. Negative for congestion, sinus pressure, trouble swallowing and voice change.   Respiratory: Negative for choking and shortness of breath.   Gastrointestinal: Negative for nausea, abdominal pain and constipation.      Objective:    BP 136/86 mmHg  Pulse 82  Temp(Src) 97.9 F (36.6 C) (Oral)  Resp 18  Ht 5' 5.5" (1.664 m)  Wt 180 lb (81.647 kg)  BMI 29.49 kg/m2  SpO2 98% Nursing note and vital signs reviewed.  Physical Exam  Constitutional: She is oriented to person, place, and time. She appears well-developed and well-nourished. No distress.  Neck:  Nontender mobile mass noted on right aspect of neck medial to the sternocleidomastoid. Firm and mobile.   Cardiovascular: Normal rate, regular rhythm, normal heart sounds and intact distal pulses.   Pulmonary/Chest: Effort normal and breath sounds normal.  Neurological: She is alert and oriented to person, place, and time.  Skin: Skin is warm and dry.  Psychiatric: She has a normal mood and affect. Her behavior is normal. Judgment and thought content normal.       Assessment & Plan:   Problem List Items Addressed This Visit      Digestive   IBS    Occasional cramping of stomach previously controlled with dicyclomine. Refill dicyclomine per patient request.       Relevant Medications   dicyclomine (BENTYL) 20 MG tablet     Other   Mass of neck - Primary    Mass of neck noted upon physical exam. Obtain ultrasound to determine underlying pathology. Obtain CBC with diff and  TSH. No difficulties with breathing or swallowing. Follow up pending ultrasound or sooner if worsening.       Relevant Orders   US Soft Tissue Head/Neck   CBC w/Diff (Completed)   TSH (Completed)

## 2015-07-07 NOTE — Assessment & Plan Note (Signed)
Occasional cramping of stomach previously controlled with dicyclomine. Refill dicyclomine per patient request.

## 2015-07-07 NOTE — Patient Instructions (Signed)
Thank you for choosing [facility].  Summary/Instructions:  Your prescription(s) have been submitted to your pharmacy or been printed and provided for you. Please take as directed and contact our office if you believe you are having problem(s) with the medication(s) or have any questions.  Please stop by the lab on the basement level of the building for your blood work. Your results will be released to MyChart (or called to you) after review, usually within 72 hours after test completion. If any changes need to be made, you will be notified at that same time.  If your symptoms worsen or fail to improve, please contact our office for further instruction, or in case of emergency go directly to the emergency room at the closest medical facility.     

## 2015-07-07 NOTE — Assessment & Plan Note (Signed)
Mass of neck noted upon physical exam. Obtain ultrasound to determine underlying pathology. Obtain CBC with diff and TSH. No difficulties with breathing or swallowing. Follow up pending ultrasound or sooner if worsening.

## 2015-07-07 NOTE — Progress Notes (Signed)
Pre visit review using our clinic review tool, if applicable. No additional management support is needed unless otherwise documented below in the visit note. 

## 2015-07-08 ENCOUNTER — Telehealth: Payer: Self-pay | Admitting: Family

## 2015-07-08 ENCOUNTER — Ambulatory Visit (INDEPENDENT_AMBULATORY_CARE_PROVIDER_SITE_OTHER): Payer: PPO | Admitting: General Practice

## 2015-07-08 DIAGNOSIS — I480 Paroxysmal atrial fibrillation: Secondary | ICD-10-CM | POA: Diagnosis not present

## 2015-07-08 DIAGNOSIS — Z5181 Encounter for therapeutic drug level monitoring: Secondary | ICD-10-CM

## 2015-07-08 DIAGNOSIS — I4891 Unspecified atrial fibrillation: Secondary | ICD-10-CM | POA: Diagnosis not present

## 2015-07-08 DIAGNOSIS — Z7901 Long term (current) use of anticoagulants: Secondary | ICD-10-CM

## 2015-07-08 LAB — POCT INR: INR: 2.2

## 2015-07-08 NOTE — Telephone Encounter (Signed)
Please inform patient that her thyroid and white/red blood cell function are all normal. Therefore we will proceed with the ultrasound.

## 2015-07-08 NOTE — Progress Notes (Signed)
I have reviewed and agree with the plan. 

## 2015-07-08 NOTE — Progress Notes (Signed)
Pre visit review using our clinic review tool, if applicable. No additional management support is needed unless otherwise documented below in the visit note. 

## 2015-07-09 ENCOUNTER — Encounter (HOSPITAL_COMMUNITY): Payer: PPO

## 2015-07-09 ENCOUNTER — Encounter (HOSPITAL_COMMUNITY)
Admission: RE | Admit: 2015-07-09 | Discharge: 2015-07-09 | Disposition: A | Discharge status: Home / Self Care | Payer: PPO | Source: Ambulatory Visit | Attending: Cardiology | Admitting: Cardiology

## 2015-07-09 DIAGNOSIS — Z951 Presence of aortocoronary bypass graft: Secondary | ICD-10-CM | POA: Diagnosis not present

## 2015-07-09 NOTE — Progress Notes (Signed)
Reviewed home exercise guidelines with patient including endpoints, temperature precautions, target heart rate and rate of perceived exertion. Pt plans to walk as her mode of home exercise. Pt voices understanding of instructions given. Shaleena Crusoe M Rebakah Cokley, MS, ACSM CCEP  

## 2015-07-09 NOTE — Telephone Encounter (Signed)
LVM for pt to call back.

## 2015-07-10 ENCOUNTER — Telehealth: Payer: Self-pay | Admitting: Family

## 2015-07-10 ENCOUNTER — Ambulatory Visit
Admission: RE | Admit: 2015-07-10 | Discharge: 2015-07-10 | Disposition: A | Discharge status: Home / Self Care | Payer: PPO | Source: Ambulatory Visit | Attending: Family | Admitting: Family

## 2015-07-10 DIAGNOSIS — R221 Localized swelling, mass and lump, neck: Secondary | ICD-10-CM

## 2015-07-10 NOTE — Telephone Encounter (Signed)
Please inform patient that her ultrasound results showed that she has generalized lymph gland enlargement with no identifiable lesion. The radiologist recommends follow-up if it enlarges significantly with a CT scan. Therefore no further treatment is needed at this time unless enlargement is noted.

## 2015-07-11 ENCOUNTER — Encounter (HOSPITAL_COMMUNITY)
Admission: RE | Admit: 2015-07-11 | Discharge: 2015-07-11 | Disposition: A | Discharge status: Home / Self Care | Payer: PPO | Source: Ambulatory Visit | Attending: Cardiology | Admitting: Cardiology

## 2015-07-11 ENCOUNTER — Encounter (HOSPITAL_COMMUNITY): Payer: PPO

## 2015-07-11 DIAGNOSIS — Z951 Presence of aortocoronary bypass graft: Secondary | ICD-10-CM | POA: Diagnosis not present

## 2015-07-11 NOTE — Telephone Encounter (Signed)
Tried calling pt back. Will send results in the mail.

## 2015-07-11 NOTE — Telephone Encounter (Signed)
Patient returned your call.

## 2015-07-11 NOTE — Telephone Encounter (Signed)
LVM for pt to call back on both numbers.

## 2015-07-11 NOTE — Telephone Encounter (Signed)
Tried calling pt back. Will send results in the mail

## 2015-07-14 ENCOUNTER — Encounter (HOSPITAL_COMMUNITY)
Admission: RE | Admit: 2015-07-14 | Discharge: 2015-07-14 | Disposition: A | Discharge status: Home / Self Care | Payer: PPO | Source: Ambulatory Visit | Attending: Cardiology | Admitting: Cardiology

## 2015-07-14 ENCOUNTER — Encounter (HOSPITAL_COMMUNITY): Payer: PPO

## 2015-07-14 DIAGNOSIS — Z951 Presence of aortocoronary bypass graft: Secondary | ICD-10-CM | POA: Diagnosis not present

## 2015-07-16 ENCOUNTER — Encounter (HOSPITAL_COMMUNITY)
Admission: RE | Admit: 2015-07-16 | Discharge: 2015-07-16 | Disposition: A | Discharge status: Home / Self Care | Payer: PPO | Source: Ambulatory Visit | Attending: Cardiology | Admitting: Cardiology

## 2015-07-16 ENCOUNTER — Encounter (HOSPITAL_COMMUNITY): Payer: PPO

## 2015-07-16 DIAGNOSIS — Z951 Presence of aortocoronary bypass graft: Secondary | ICD-10-CM | POA: Diagnosis not present

## 2015-07-17 NOTE — Progress Notes (Signed)
HPI: FU CAD and atrial myxoma resection. Had postoperative atrial fibrillation 12/12. Echo 4/16 orderded by primary care for cardiomegaly; this revealed normal LV function, left atrial mass; mild MR. Cardiac cath 5/16 showed 80 LAD. Preop carotid dopplers 5/16 with no significant stenosis. Patient had CABG with LIMA to LAD, resection of atrial myxoma and LAA oversewn. Echo 7/16 showed normal LV function, grade 2 diastolic dysfunction, mild MR, mild biatrial enlargement and moderate TR. Patient did have postoperative atrial fibrillation treated with amiodarone. Since last seen, she has mild dyspnea on exertion but no orthopnea, PND or pedal edema. No chest pain or syncope.  Current Outpatient Prescriptions  Medication Sig Dispense Refill  . albuterol-ipratropium (COMBIVENT) 18-103 MCG/ACT inhaler Inhale 1 puff into the lungs every 4 (four) hours. Pt uses 1 puff PRN, doesn't use daily    . aspirin EC 81 MG tablet Take 81 mg by mouth daily.    Marland Kitchen atorvastatin (LIPITOR) 10 MG tablet Take 1 tablet (10 mg total) by mouth daily at 6 PM. 30 tablet 11  . calcium citrate-vitamin D (CITRACAL+D) 315-200 MG-UNIT per tablet Take 2 tablets by mouth 2 (two) times daily.    . Cholecalciferol (VITAMIN D3) 1000 UNITS CAPS Take 1,000 Units by mouth daily.     . COMBIVENT RESPIMAT 20-100 MCG/ACT AERS respimat Inhale 1 puff into the lungs as needed.  5  . dicyclomine (BENTYL) 20 MG tablet Take 1 tablet (20 mg total) by mouth every 6 (six) hours. 30 tablet 1  . docusate sodium (COLACE) 100 MG capsule Take 100 mg by mouth daily.    . fexofenadine (ALLEGRA) 180 MG tablet Take 180 mg by mouth daily. Pt takes daily    . furosemide (LASIX) 20 MG tablet Take 20 mg by mouth daily.    Marland Kitchen latanoprost (XALATAN) 0.005 % ophthalmic solution Place 1 drop into both eyes daily.      Marland Kitchen losartan (COZAAR) 25 MG tablet TAKE 1 TABLET BY MOUTH EVERY DAY 30 tablet 0  . metoprolol tartrate (LOPRESSOR) 25 MG tablet TAKE 1 & 1/2 TABLETS BY  MOUTH TWICE A DAY 90 tablet 0  . montelukast (SINGULAIR) 10 MG tablet Take 1 tablet (10 mg total) by mouth at bedtime. 90 tablet 1  . Multiple Vitamin (MULITIVITAMIN WITH MINERALS) TABS Take 1 tablet by mouth daily.    Marland Kitchen omeprazole (PRILOSEC) 20 MG capsule TAKE ONE CAPSULE BY MOUTH ONE TIME DAILY 90 capsule 3  . traMADol (ULTRAM) 50 MG tablet Take 1 tablet (50 mg total) by mouth every 6 (six) hours as needed. Dr.Ramos (Patient taking differently: Take 50 mg by mouth every 6 (six) hours as needed. Dr.Ramos; pt takes 1 tablet every night, maybe 1 during the day as needed) 30 tablet 0  . warfarin (COUMADIN) 5 MG tablet Take 1 tablet (5 mg total) by mouth See admin instructions. 30 tablet 3   Current Facility-Administered Medications  Medication Dose Route Frequency Provider Last Rate Last Dose  . pneumococcal 13-valent conjugate vaccine (PREVNAR 13) injection 0.5 mL  0.5 mL Intramuscular Tomorrow-1000 Hendricks Limes, MD         Past Medical History  Diagnosis Date  . Diverticulitis 2002    based on CT scan  . Hyperlipidemia   . Hypertension   . Superficial phlebitis      X 2, 1999, 2000  . Ocular hypertension     glaucoma suspect, Dr. Kathrin Penner  . Skin cancer   . Endometriosis   .  Gastritis 2006    @ Endo  . Hiatal hernia   . Diverticulosis     Dr Henrene Pastor  . GERD (gastroesophageal reflux disease)   . DVT (deep venous thrombosis) 2000    no trigger  . Syncope 11/12/11    in context CAP . Carotid Doppler exam was negative. 2D echocardiogram revealed mild dilation of the left atrium and moderate increase in the systolic pressureof  the pulmonary artery  . Atrial fibrillation     Post op after repair of ankle fracture  . UTI (lower urinary tract infection) 01/2013    Strep bovis ; PCN sensitive  . IBS (irritable bowel syndrome)     Past Surgical History  Procedure Laterality Date  . Total abdominal hysterectomy w/ bilateral salpingoophorectomy  1980    dysfunctional menses,  endometriosis  . Varicose vein surgery      1960, 1965, 2009  . Tonsillectomy    . Cataract extraction w/ intraocular lens implant      OS; Dr Kathrin Penner  . Colonoscopy  1995, 2002, 2012    diverticulosis; Dr Henrene Pastor  . Orif ankle fracture  11/14/2011    Procedure: OPEN REDUCTION INTERNAL FIXATION (ORIF) ANKLE FRACTURE;  Surgeon: Colin Rhein;  Location: [facility];  Service: Orthopedics;  Laterality: Left;  open reduction internal fixation trimalleolar ankle fracture  . Cardiac catheterization N/A 03/28/2015    Procedure: Left Heart Cath and Coronary Angiography;  Surgeon: Jolaine Artist, MD;  Location: [facility] INVASIVE CV LAB CUPID;  Service: Cardiovascular;  Laterality: N/A;  . Coronary artery bypass graft N/A 04/01/2015    Procedure: CORONARY ARTERY BYPASS GRAFTING (CABG), ON PUMP, TIMES ONE, USING LEFT INTERNAL MAMMARY ARTERY;  Surgeon: Ivin Poot, MD;  Location: [place];  Service: Open Heart Surgery;  Laterality: N/A;  . Excision of atrial myxoma N/A 04/01/2015    Procedure: EXCISION OF ATRIAL MYXOMA;  Surgeon: Ivin Poot, MD;  Location: [place];  Service: Open Heart Surgery;  Laterality: N/A;  . Tee without cardioversion N/A 04/01/2015    Procedure: TRANSESOPHAGEAL ECHOCARDIOGRAM (TEE);  Surgeon: Ivin Poot, MD;  Location: [place];  Service: Open Heart Surgery;  Laterality: N/A;    Social History   Social History  . Marital Status: Married    Spouse Name: N/A  . Number of Children: 2  . Years of Education: N/A   Occupational History  . Retired    Social History Main Topics  . Smoking status: Never Smoker   . Smokeless tobacco: Never Used  . Alcohol Use: No  . Drug Use: No  . Sexual Activity: Not on file   Other Topics Concern  . Not on file   Social History Narrative   REG EXERCISE   HAD COLONOSCOPY AND ENDOSCOPY DONE DATES UNKNOWN    ROS: no fevers or chills, productive cough, hemoptysis, dysphasia, odynophagia, melena, hematochezia, dysuria, hematuria, rash,  seizure activity, orthopnea, PND, pedal edema, claudication. Remaining systems are negative.  Physical Exam: Well-developed well-nourished in no acute distress.  Skin is warm and dry.  HEENT is normal.  Neck is supple.  Chest is clear to auscultation with normal expansion. Sternotomy without evidence of infection. Cardiovascular exam is regular rate and rhythm. 2/6 systolic murmur left sternal border. Abdominal exam nontender or distended. No masses palpated. Extremities show no edema. neuro grossly intact

## 2015-07-18 ENCOUNTER — Encounter (HOSPITAL_COMMUNITY): Payer: PPO

## 2015-07-18 ENCOUNTER — Encounter (HOSPITAL_COMMUNITY)
Admission: RE | Admit: 2015-07-18 | Discharge: 2015-07-18 | Disposition: A | Discharge status: Home / Self Care | Payer: PPO | Source: Ambulatory Visit | Attending: Cardiology | Admitting: Cardiology

## 2015-07-18 DIAGNOSIS — Z951 Presence of aortocoronary bypass graft: Secondary | ICD-10-CM | POA: Diagnosis not present

## 2015-07-21 ENCOUNTER — Encounter (HOSPITAL_COMMUNITY): Payer: PPO

## 2015-07-21 ENCOUNTER — Encounter (HOSPITAL_COMMUNITY)
Admission: RE | Admit: 2015-07-21 | Discharge: 2015-07-21 | Disposition: A | Discharge status: Home / Self Care | Payer: PPO | Source: Ambulatory Visit | Attending: Cardiology | Admitting: Cardiology

## 2015-07-21 DIAGNOSIS — Z951 Presence of aortocoronary bypass graft: Secondary | ICD-10-CM | POA: Diagnosis not present

## 2015-07-22 ENCOUNTER — Telehealth: Payer: Self-pay | Admitting: Cardiology

## 2015-07-22 ENCOUNTER — Encounter: Payer: Self-pay | Admitting: Cardiology

## 2015-07-22 ENCOUNTER — Ambulatory Visit (INDEPENDENT_AMBULATORY_CARE_PROVIDER_SITE_OTHER): Payer: PPO | Admitting: Cardiology

## 2015-07-22 VITALS — BP 160/80 | HR 70 | Ht 65.0 in | Wt 183.3 lb

## 2015-07-22 DIAGNOSIS — E785 Hyperlipidemia, unspecified: Secondary | ICD-10-CM | POA: Diagnosis not present

## 2015-07-22 DIAGNOSIS — I480 Paroxysmal atrial fibrillation: Secondary | ICD-10-CM

## 2015-07-22 DIAGNOSIS — D151 Benign neoplasm of heart: Secondary | ICD-10-CM

## 2015-07-22 DIAGNOSIS — I4891 Unspecified atrial fibrillation: Secondary | ICD-10-CM

## 2015-07-22 DIAGNOSIS — I10 Essential (primary) hypertension: Secondary | ICD-10-CM

## 2015-07-22 DIAGNOSIS — I2510 Atherosclerotic heart disease of native coronary artery without angina pectoris: Secondary | ICD-10-CM

## 2015-07-22 DIAGNOSIS — I2583 Coronary atherosclerosis due to lipid rich plaque: Secondary | ICD-10-CM

## 2015-07-22 NOTE — Assessment & Plan Note (Signed)
Blood pressure mildly elevated but she has this followed at cardiac rehabilitation and it has been normal. Continue present medications.

## 2015-07-22 NOTE — Assessment & Plan Note (Signed)
Patient remains in sinus rhythm on examination. Continue beta blocker. We will continue Coumadin for now. They will review DOACs cost and we will begin whichever is the cheapest. She will need CBC and renal function checked 4 weeks after initiating.

## 2015-07-22 NOTE — Patient Instructions (Signed)
Your physician wants you to follow-up in: [place] will receive a reminder letter in the mail two months in advance. If you don't receive a letter, please call our office to schedule the follow-up appointment.   Kief PRADAXA TO REPLACE WARFARIN

## 2015-07-22 NOTE — Assessment & Plan Note (Signed)
Patient will need a follow-up echocardiogram in 1 year.

## 2015-07-22 NOTE — Telephone Encounter (Signed)
Left message for pt to call.

## 2015-07-22 NOTE — Assessment & Plan Note (Signed)
Continue statin. Liver functions were mildly elevated recently. However amiodarone was discontinued which may have been contributing. We will plan to repeat these with next blood draw.

## 2015-07-22 NOTE — Assessment & Plan Note (Signed)
Continue statin. Discontinue aspirin given for anticoagulation.

## 2015-07-22 NOTE — Telephone Encounter (Signed)
Patient was told by Hilda Blades to call back and speak with her this afternoon.

## 2015-07-23 ENCOUNTER — Encounter (HOSPITAL_COMMUNITY)
Admission: RE | Admit: 2015-07-23 | Discharge: 2015-07-23 | Disposition: A | Discharge status: Home / Self Care | Payer: PPO | Source: Ambulatory Visit | Attending: Cardiology | Admitting: Cardiology

## 2015-07-23 ENCOUNTER — Encounter (HOSPITAL_COMMUNITY): Payer: PPO

## 2015-07-23 DIAGNOSIS — Z951 Presence of aortocoronary bypass graft: Secondary | ICD-10-CM | POA: Diagnosis not present

## 2015-07-23 NOTE — Telephone Encounter (Addendum)
Spoke with pt, eliquis is the new NOAC she is wanting to change to. She wants to make sure the vit she takes with 40 mcg of vit k will be okay to continue. Will discuss with dr Stanford Breed

## 2015-07-24 ENCOUNTER — Other Ambulatory Visit: Payer: Self-pay | Admitting: *Deleted

## 2015-07-24 DIAGNOSIS — R7989 Other specified abnormal findings of blood chemistry: Secondary | ICD-10-CM

## 2015-07-24 DIAGNOSIS — R945 Abnormal results of liver function studies: Principal | ICD-10-CM

## 2015-07-24 MED ORDER — APIXABAN 5 MG PO TABS: 5.0000 mg | ORAL_TABLET | Freq: Two times a day (BID) | ORAL | Status: DC

## 2015-07-24 NOTE — Telephone Encounter (Addendum)
Discussed with dr Stanford Breed to start eliquis 5 mg one tablet twice daily. It is okay for the patient to take the vit k. She will need bmp and cbc in 4 weeks after starting the eliquis. Discussed with kristin, pharm md, appt made for pt to come to the office for an INR and based on the result, kristin will help start the pt on eliquis. Samples, savings card and lab orders placed at the front desk for pt to pick up when she comes for INR check. Patient voiced understanding of above.

## 2015-07-25 ENCOUNTER — Encounter (HOSPITAL_COMMUNITY): Payer: PPO

## 2015-07-25 ENCOUNTER — Encounter (HOSPITAL_COMMUNITY)
Admission: RE | Admit: 2015-07-25 | Discharge: 2015-07-25 | Disposition: A | Discharge status: Home / Self Care | Payer: PPO | Source: Ambulatory Visit | Attending: Cardiology | Admitting: Cardiology

## 2015-07-25 DIAGNOSIS — Z951 Presence of aortocoronary bypass graft: Secondary | ICD-10-CM | POA: Insufficient documentation

## 2015-07-30 ENCOUNTER — Telehealth: Payer: Self-pay | Admitting: *Deleted

## 2015-07-30 ENCOUNTER — Encounter (HOSPITAL_COMMUNITY): Payer: PPO

## 2015-07-30 ENCOUNTER — Encounter (HOSPITAL_COMMUNITY)
Admission: RE | Admit: 2015-07-30 | Discharge: 2015-07-30 | Disposition: A | Discharge status: Home / Self Care | Payer: PPO | Source: Ambulatory Visit | Attending: Cardiology | Admitting: Cardiology

## 2015-07-30 ENCOUNTER — Ambulatory Visit (HOSPITAL_COMMUNITY)
Admission: RE | Admit: 2015-07-30 | Discharge: 2015-07-30 | Disposition: A | Discharge status: Home / Self Care | Payer: PPO | Source: Ambulatory Visit | Attending: Internal Medicine | Admitting: Internal Medicine

## 2015-07-30 ENCOUNTER — Telehealth (HOSPITAL_COMMUNITY): Payer: Self-pay | Admitting: *Deleted

## 2015-07-30 DIAGNOSIS — I517 Cardiomegaly: Secondary | ICD-10-CM

## 2015-07-30 DIAGNOSIS — Z951 Presence of aortocoronary bypass graft: Secondary | ICD-10-CM

## 2015-07-30 DIAGNOSIS — I2510 Atherosclerotic heart disease of native coronary artery without angina pectoris: Secondary | ICD-10-CM

## 2015-07-30 DIAGNOSIS — I480 Paroxysmal atrial fibrillation: Secondary | ICD-10-CM

## 2015-07-30 DIAGNOSIS — I2583 Coronary atherosclerosis due to lipid rich plaque: Principal | ICD-10-CM

## 2015-07-30 DIAGNOSIS — Z7901 Long term (current) use of anticoagulants: Secondary | ICD-10-CM

## 2015-07-30 NOTE — Telephone Encounter (Signed)
Valerie Rollins provided patient with precautions and advisements, as well, regarding new or worsening sx and how to check her pulse for rate. EKG order placed for Cardiac Rehab/Maria. Verdis Frederickson will message Dr. Stanford Breed regarding pt's continuation for Cardiac Rehab appt on Friday. Verdis Frederickson checked with AFib clinic and they did not have any orders for patient at this time.

## 2015-07-30 NOTE — Progress Notes (Addendum)
Patient appears to be in Atrial Fibrillation at a controlled rate in the 70's to 80's. Blood pressure 122/70. Oxygen saturation 97% on room air. Patient asymptomatic. Dr Jacalyn Lefevre office called and notified. I spoke with Toni Amend RN.  12 lead ECG obtained. Dr Gaetano Hawthorne said the patient is okay to go home as long as she feels okay. Nevin Bloodgood spoke with Mrs Valerie Rollins over the phone and gave the patient instructions if she becomes symptomatic.  Patient reported that she used her inhaler this morning. Mrs Mailloux is taking warfarin but plans to meet with the pharmacist tomorrow to discuss switching to eliquis. Patient left cardiac rehab without complaints. The atrial fibrillation clinic was also called about today's atrial fibrillation.

## 2015-07-30 NOTE — Telephone Encounter (Signed)
Maria from Cardiac Rehab called to report that Dr. Jacalyn Lefevre patient, Valerie Rollins, is currently at Cardiac Rehab and in Atrial Fibrillation at rates that vary between 70-80's. She is controlled and asymptomatic at this time. She is taking Coumadin. BP is 122/72. POX 97%. Patient has been in NSR at Cardiac Rehab prior to today. Patient is unaware of when AFib started. Reviewed with Dr. Skeet Latch. No orders received. Advised for patient to call back if any sx develop. Verdis Frederickson is also going to advise AFib Clinic regarding patient. 12 Lead EKG in EPIC. Dr. Oval Linsey reviewed EKG results. Verdis Frederickson will provide patient with precautions and advisement regarding calling office and/or 911 for any changes in status or sx and signs/sx to watch for regarding AFib. Routing to Dr. Stanford Breed as Juluis Rainier and to see if he wants to see patient back in an OV.

## 2015-07-30 NOTE — Telephone Encounter (Signed)
Valerie Rollins from cardiac rehab called stating patient was found to be in AFib upon arrival to exercise today. Patient is unaware of afib and is completely asymptomatic.  HR and BP are stable at cardiac rehab.  Offered to schedule patient appointment to be seen this week to see if medication adjustments need to be made. Verdis Frederickson will convey message to patient.

## 2015-07-31 ENCOUNTER — Ambulatory Visit (INDEPENDENT_AMBULATORY_CARE_PROVIDER_SITE_OTHER): Payer: PPO | Admitting: Pharmacist Clinician (PhC)/ Clinical Pharmacy Specialist

## 2015-07-31 ENCOUNTER — Ambulatory Visit: Payer: PPO | Admitting: Pharmacist Clinician (PhC)/ Clinical Pharmacy Specialist

## 2015-07-31 DIAGNOSIS — Z5181 Encounter for therapeutic drug level monitoring: Secondary | ICD-10-CM | POA: Diagnosis not present

## 2015-07-31 DIAGNOSIS — I480 Paroxysmal atrial fibrillation: Secondary | ICD-10-CM | POA: Diagnosis not present

## 2015-07-31 LAB — POCT INR: INR: 1.4

## 2015-07-31 NOTE — Telephone Encounter (Signed)
Ok to continue cardiac rehab; schedule elective fuov Kirk Ruths

## 2015-07-31 NOTE — Progress Notes (Signed)
  Pt was started on Eliquis for AF today.  She has previously been on warfarin and recently discussed with Dr. Stanford Breed about making the switch. Her INRs have previously been followed by [facility].    Reviewed patients medication list.  Pt is not currently on any combined P-gp and strong CYP3A4 inhibitors/inducers (ketoconazole, traconazole, ritonavir, carbamazepine, phenytoin, rifampin, St. John's wort).  Reviewed labs.  SCr 1.04, Weight 183.  Dose appropriate based on CrCl.   Hgb and HCT Within Normal Limits  A full discussion of the nature of anticoagulants has been carried out.  A benefit/risk analysis has been presented to the patient, so that they understand the justification for choosing anticoagulation with Eliquis at this time.  The need for compliance is stressed.  Pt is aware to take the medication twice daily.  Side effects of potential bleeding are discussed, including unusual colored urine or stools, coughing up blood or coffee ground emesis, nose bleeds or serious fall or head trauma.  Discussed signs and symptoms of stroke. The patient should avoid any OTC items containing aspirin or ibuprofen.  Avoid alcohol consumption.   Call if any signs of abnormal bleeding.  Discussed financial obligations and resolved any difficulty in obtaining medication.

## 2015-08-01 ENCOUNTER — Encounter (HOSPITAL_COMMUNITY): Payer: PPO

## 2015-08-01 ENCOUNTER — Telehealth: Payer: Self-pay | Admitting: Cardiology

## 2015-08-01 ENCOUNTER — Encounter (HOSPITAL_COMMUNITY)
Admission: RE | Admit: 2015-08-01 | Discharge: 2015-08-01 | Disposition: A | Discharge status: Home / Self Care | Payer: PPO | Source: Ambulatory Visit | Attending: Cardiology | Admitting: Cardiology

## 2015-08-01 DIAGNOSIS — Z951 Presence of aortocoronary bypass graft: Secondary | ICD-10-CM | POA: Diagnosis not present

## 2015-08-01 MED ORDER — METOPROLOL TARTRATE 25 MG PO TABS: 25.0000 mg | ORAL_TABLET | Freq: Two times a day (BID) | ORAL | Status: DC

## 2015-08-01 NOTE — Progress Notes (Signed)
Patient returned to cardiac rehab this afternoon. Telemetry rhythm noted as Sinus Rhythm with arrhythmia rate in the 60's to 70's. Valerie Rollins was also noted to have a nonsustained run of junctional rhythm rate 48 at rest. Dr Jacalyn Lefevre office called and notified of today's rhythm strips spoke with Kyra Manges. ECG tracings faxed to Dr Jacalyn Lefevre office for review. Dr Stanford Breed reviewed today ECG tracings and decreased Valerie Rollins's metoprolol to 25 mg twice a day. Kyra Manges talked with the patient via telephone. Dr Stanford Breed said Valerie Rollins is okay proceed with exercise at cardiac rehab.  Valerie Rollins exercised at cardiac rehab without difficulty this afternoon. Vital signs stable. Oxygen saturation 97% on room air. Will continue to monitor the patient throughout  the program.

## 2015-08-01 NOTE — Telephone Encounter (Signed)
Maria from cardiac rehab called  Said patient prior to rehab today monitor is showing SR/PJC's, junctional, HR 48 blood pressure of 118/70.  No symptoms.  Dr. Stanford Breed decreased Metoprolol (Lopressor) 37.5 mg twice a day to 25 mg twice a day  May continue with exercise at cardiac rehab today  Patient verbalizes understanding and able to repeat instructions

## 2015-08-04 ENCOUNTER — Encounter (HOSPITAL_COMMUNITY)
Admission: RE | Admit: 2015-08-04 | Discharge: 2015-08-04 | Disposition: A | Discharge status: Home / Self Care | Payer: PPO | Source: Ambulatory Visit | Attending: Cardiology | Admitting: Cardiology

## 2015-08-04 ENCOUNTER — Encounter (HOSPITAL_COMMUNITY): Payer: PPO

## 2015-08-04 DIAGNOSIS — Z951 Presence of aortocoronary bypass graft: Secondary | ICD-10-CM | POA: Diagnosis not present

## 2015-08-06 ENCOUNTER — Encounter (HOSPITAL_COMMUNITY): Payer: PPO

## 2015-08-06 ENCOUNTER — Encounter (HOSPITAL_COMMUNITY)
Admission: RE | Admit: 2015-08-06 | Discharge: 2015-08-06 | Disposition: A | Discharge status: Home / Self Care | Payer: PPO | Source: Ambulatory Visit | Attending: Cardiology | Admitting: Cardiology

## 2015-08-06 DIAGNOSIS — Z951 Presence of aortocoronary bypass graft: Secondary | ICD-10-CM | POA: Diagnosis not present

## 2015-08-06 NOTE — Progress Notes (Signed)
[name] 79 y.o. female Nutrition Note Spoke with pt. Nutrition Plan and Nutrition Survey goals reviewed with pt. Pt is following Step 2 of the Therapeutic Lifestyle Changes diet. Pt wants to lose wt. Pt has not been actively trying to lose wt. Wt loss tips reviewed. Pt is pre-diabetic according to pt's last A1c. Pre-diabetes discussed. Pt c/o constipation. Pt reports drinking sweet tea "which is a bad habit that I need to stop." Pt sweet tea is sweetened with table sugar. Pt eats out frequently "because my back and I retired from cooking." Pt has attended the Nutrition II class that discusses ways to make healthier choices when eating out. Pt expressed understanding of the information reviewed.   Lab Results  Component Value Date   HGBA1C 5.8* 03/31/2015    Nutrition Diagnosis ? Food-and nutrition-related knowledge deficit related to lack of exposure to information as related to diagnosis of: ? CVD ? Pre-DM ? Obesity related to excessive energy intake as evidenced by a BMI of 30.2  Nutrition RX/ Estimated Daily Nutrition Needs for: wt loss 1250-1500 Kcal, 30-40 gm fat, 9-11 gm sat fat, 1.2-1.5 gm trans-fat, <1500 mg sodium  Nutrition Intervention ? Pt's individual nutrition plan reviewed with pt. ? Benefits of adopting Therapeutic Lifestyle Changes discussed when Medficts reviewed. ? Pt to attend the Portion Distortion class ? Pt to attend the   ? Nutrition I class - met; 06/24/15                 ? Nutrition II class - met; 07/01/15 ? Continue client-centered nutrition education by RD, as part of interdisciplinary care. Goal(s) ? Pt to identify food quantities necessary to achieve: ? wt loss to a goal wt of 158-176 lb (72.1-80.3 kg) at graduation from cardiac rehab.  ? Pt to describe the benefit of including fruits, vegetables, whole grains, and low-fat dairy products in a heart healthy meal plan. Monitor and Evaluate progress toward nutrition goal with team. Nutrition Risk: Change to  Moderate  , M.Ed, RD, LDN, CDE 08/06/2015 3:45 PM  

## 2015-08-07 ENCOUNTER — Ambulatory Visit (INDEPENDENT_AMBULATORY_CARE_PROVIDER_SITE_OTHER): Payer: PPO | Admitting: General Practice

## 2015-08-07 ENCOUNTER — Ambulatory Visit: Payer: PPO

## 2015-08-07 DIAGNOSIS — I4891 Unspecified atrial fibrillation: Secondary | ICD-10-CM

## 2015-08-08 ENCOUNTER — Encounter (HOSPITAL_COMMUNITY)
Admission: RE | Admit: 2015-08-08 | Discharge: 2015-08-08 | Disposition: A | Discharge status: Home / Self Care | Payer: PPO | Source: Ambulatory Visit | Attending: Cardiology | Admitting: Cardiology

## 2015-08-08 ENCOUNTER — Encounter (HOSPITAL_COMMUNITY): Payer: PPO

## 2015-08-08 DIAGNOSIS — Z951 Presence of aortocoronary bypass graft: Secondary | ICD-10-CM | POA: Diagnosis not present

## 2015-08-11 ENCOUNTER — Encounter (HOSPITAL_COMMUNITY)
Admission: RE | Admit: 2015-08-11 | Discharge: 2015-08-11 | Disposition: A | Discharge status: Home / Self Care | Payer: PPO | Source: Ambulatory Visit | Attending: Cardiology | Admitting: Cardiology

## 2015-08-11 ENCOUNTER — Encounter (HOSPITAL_COMMUNITY): Payer: PPO

## 2015-08-11 DIAGNOSIS — Z951 Presence of aortocoronary bypass graft: Secondary | ICD-10-CM | POA: Diagnosis not present

## 2015-08-13 ENCOUNTER — Encounter (HOSPITAL_COMMUNITY)
Admission: RE | Admit: 2015-08-13 | Discharge: 2015-08-13 | Disposition: A | Discharge status: Home / Self Care | Payer: PPO | Source: Ambulatory Visit | Attending: Cardiology | Admitting: Cardiology

## 2015-08-13 ENCOUNTER — Encounter (HOSPITAL_COMMUNITY): Payer: PPO

## 2015-08-13 DIAGNOSIS — Z951 Presence of aortocoronary bypass graft: Secondary | ICD-10-CM | POA: Diagnosis not present

## 2015-08-15 ENCOUNTER — Encounter (HOSPITAL_COMMUNITY)
Admission: RE | Admit: 2015-08-15 | Discharge: 2015-08-15 | Disposition: A | Discharge status: Home / Self Care | Payer: PPO | Source: Ambulatory Visit | Attending: Cardiology | Admitting: Cardiology

## 2015-08-15 ENCOUNTER — Encounter (HOSPITAL_COMMUNITY): Payer: PPO

## 2015-08-15 DIAGNOSIS — Z951 Presence of aortocoronary bypass graft: Secondary | ICD-10-CM | POA: Diagnosis not present

## 2015-08-18 ENCOUNTER — Encounter (HOSPITAL_COMMUNITY): Payer: PPO

## 2015-08-18 ENCOUNTER — Encounter (HOSPITAL_COMMUNITY)
Admission: RE | Admit: 2015-08-18 | Discharge: 2015-08-18 | Disposition: A | Discharge status: Home / Self Care | Payer: PPO | Source: Ambulatory Visit | Attending: Cardiology | Admitting: Cardiology

## 2015-08-18 DIAGNOSIS — Z951 Presence of aortocoronary bypass graft: Secondary | ICD-10-CM | POA: Diagnosis not present

## 2015-08-19 ENCOUNTER — Encounter: Payer: Self-pay | Admitting: Physician Assistant

## 2015-08-19 ENCOUNTER — Ambulatory Visit (INDEPENDENT_AMBULATORY_CARE_PROVIDER_SITE_OTHER): Payer: PPO | Admitting: Physician Assistant

## 2015-08-19 VITALS — BP 116/72 | HR 75 | Ht 65.0 in | Wt 181.5 lb

## 2015-08-19 DIAGNOSIS — I4891 Unspecified atrial fibrillation: Secondary | ICD-10-CM | POA: Diagnosis not present

## 2015-08-19 DIAGNOSIS — Z79899 Other long term (current) drug therapy: Secondary | ICD-10-CM | POA: Diagnosis not present

## 2015-08-19 NOTE — Progress Notes (Signed)
Cardiology Office Note   Date:  08/19/2015   ID:  Valerie Rollins, DOB Dec 01, 1935, MRN [id number]  PCP:  Unice Cobble, MD  Cardiologist:  Dr Alcide Evener, PA-C   Chief Complaint  Patient presents with  . Follow-up    Follow-up of an episode of atrial fib while at Cardiac Rehab.  No complaints of chest pain or dizziness.  Occas. SOB.  Mild ankle edema.    History of Present Illness: Valerie Rollins is a 79 y.o. female with a history of atrial myxoma resection and single-vessel bypass surgery in May 2016.  Valerie Rollins presents for evaluation of atrial fibrillation.  Valerie Rollins is about halfway through cardiac rehabilitation after her bypass surgery. She has sheets with her which show her heart rate and blood pressure being generally well-controlled. She is exercising up to 3 metastases on various pieces of equipment and doing well but this. She feels that she is gradually getting stronger and has had no problems.   However, on 07/30/2015, she was on a recumbent bike when the staff came over and told her she was in atrial fibrillation. Until they told her, she had no awareness of it and no idea she was in it. She was completely asymptomatic. There is a note in the chart that documents her being in atrial fibrillation and states her heart rate was controlled.  Generally she is between 76 and 90. When her heart rate is in the 50s, she reports no symptoms.   Past Medical History  Diagnosis Date  . Diverticulitis 2002    based on CT scan  . Hyperlipidemia   . Hypertension   . Superficial phlebitis      X 2, 1999, 2000  . Ocular hypertension     glaucoma suspect, Dr. Kathrin Penner  . Skin cancer   . Endometriosis   . Gastritis 2006    @ Endo  . Hiatal hernia   . Diverticulosis     Dr Henrene Pastor  . GERD (gastroesophageal reflux disease)   . DVT (deep venous thrombosis) 2000    no trigger  . Syncope 11/12/11    in context CAP . Carotid Doppler exam was negative. 2D  echocardiogram revealed mild dilation of the left atrium and moderate increase in the systolic pressureof  the pulmonary artery  . Atrial fibrillation     Post op after repair of ankle fracture  . UTI (lower urinary tract infection) 01/2013    Strep bovis ; PCN sensitive  . IBS (irritable bowel syndrome)   . CAD (coronary artery disease) 03/2015    LAD 80%, otherwise minimal CAD. s/p LIMA-LAD  . 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 Total abdominal hysterectomy w/ bilateral salpingoophorectomy  1980    dysfunctional menses, endometriosis  . Varicose vein surgery      1960, 1965, 2009  . Tonsillectomy    . Cataract extraction w/ intraocular lens implant      OS; Dr Kathrin Penner  . Colonoscopy  1995, 2002, 2012    diverticulosis; Dr Henrene Pastor  . Orif ankle fracture  11/14/2011    Procedure: OPEN REDUCTION INTERNAL FIXATION (ORIF) ANKLE FRACTURE;  Surgeon: Colin Rhein;  Location: [facility];  Service: Orthopedics;  Laterality: Left;  open reduction internal fixation trimalleolar ankle fracture  . Cardiac catheterization N/A 03/28/2015    Procedure: Left Heart Cath and  Coronary Angiography;  Surgeon: Jolaine Artist, MD;  Location: [facility] INVASIVE CV LAB CUPID;  Service: Cardiovascular;  Laterality: N/A;  . Coronary artery bypass graft N/A 04/01/2015    Procedure: CORONARY ARTERY BYPASS GRAFTING (CABG), ON PUMP, TIMES ONE, USING LEFT INTERNAL MAMMARY ARTERY;  Surgeon: Ivin Poot, MD;  Location: [place];  Service: Open Heart Surgery;  Laterality: N/A;  . Excision of atrial myxoma N/A 04/01/2015    Procedure: EXCISION OF ATRIAL MYXOMA;  Surgeon: Ivin Poot, MD;  Location: [place];  Service: Open Heart Surgery;  Laterality: N/A;  . Tee without cardioversion N/A 04/01/2015    Procedure: TRANSESOPHAGEAL ECHOCARDIOGRAM (TEE);  Surgeon: Ivin Poot, MD;  Location: [place];  Service: Open Heart Surgery;   Laterality: N/A;   Current Outpatient Prescriptions  Medication Sig Dispense Refill  . apixaban (ELIQUIS) 5 MG TABS tablet Take 1 tablet (5 mg total) by mouth 2 (two) times daily. 56 tablet 0  . atorvastatin (LIPITOR) 10 MG tablet Take 1 tablet (10 mg total) by mouth daily at 6 PM. 30 tablet 11  . calcium citrate-vitamin D (CITRACAL+D) 315-200 MG-UNIT per tablet Take 2 tablets by mouth 2 (two) times daily.    . Cholecalciferol (VITAMIN D3) 1000 UNITS CAPS Take 1,000 Units by mouth daily.     . COMBIVENT RESPIMAT 20-100 MCG/ACT AERS respimat Inhale 1 puff into the lungs as needed.  5  . dicyclomine (BENTYL) 20 MG tablet Take 1 tablet (20 mg total) by mouth every 6 (six) hours. 30 tablet 1  . docusate sodium (COLACE) 100 MG capsule Take 100 mg by mouth daily.    . fexofenadine (ALLEGRA) 180 MG tablet Take 180 mg by mouth daily. Pt takes daily    . furosemide (LASIX) 20 MG tablet Take 20 mg by mouth daily.    Marland Kitchen latanoprost (XALATAN) 0.005 % ophthalmic solution Place 1 drop into both eyes daily.      Marland Kitchen losartan (COZAAR) 25 MG tablet TAKE 1 TABLET BY MOUTH EVERY DAY 30 tablet 0  . metoprolol tartrate (LOPRESSOR) 25 MG tablet Take 1 tablet (25 mg total) by mouth 2 (two) times daily. 90 tablet 0  . montelukast (SINGULAIR) 10 MG tablet Take 1 tablet (10 mg total) by mouth at bedtime. 90 tablet 1  . Multiple Vitamin (MULITIVITAMIN WITH MINERALS) TABS Take 1 tablet by mouth daily.    Marland Kitchen omeprazole (PRILOSEC) 20 MG capsule TAKE ONE CAPSULE BY MOUTH ONE TIME DAILY 90 capsule 3  . traMADol (ULTRAM) 50 MG tablet Take 1 tablet (50 mg total) by mouth every 6 (six) hours as needed. Dr.Ramos (Patient taking differently: Take 50 mg by mouth every 6 (six) hours as needed. Dr.Ramos; pt takes 1 tablet every night, maybe 1 during the day as needed) 30 tablet 0   Current Facility-Administered Medications  Medication Dose Route Frequency Provider Last Rate Last Dose  . pneumococcal 13-valent conjugate vaccine (PREVNAR  13) injection 0.5 mL  0.5 mL Intramuscular Tomorrow-1000 Hendricks Limes, MD        Allergies:   Oxybutynin chloride; Propoxyphene n-acetaminophen; Alendronate sodium; and Ramipril    Social History:  The patient  reports that she has never smoked. She has never used smokeless tobacco. She reports that she does not drink alcohol or use illicit drugs.   Family History:  The patient's family history includes Alzheimer's disease in her mother; Colon cancer in her maternal grandmother; Diverticulitis in her daughter; Heart attack (age of onset: 53) in her  father; Heart attack (age of onset: 54) in her paternal grandmother; Hyperlipidemia in her brother; Hypertension in her brother. There is no history of Diabetes or Stroke.    ROS:  Please see the history of present illness. All other systems are reviewed and negative.    PHYSICAL EXAM: VS:  BP 116/72 mmHg  Pulse 75  Ht 5\' 5"  (1.651 m)  Wt 181 lb 8 oz (82.328 kg)  BMI 30.20 kg/m2 , BMI Body mass index is 30.2 kg/(m^2). GEN: Well nourished, well developed, female in no acute distress HEENT: normal Neck: no JVD, carotid bruits, or masses Cardiac: RRR; 2/6 murmur, no rubs, or gallops,no edema  Respiratory:  clear to auscultation bilaterally, normal work of breathing GI: soft, nontender, nondistended, + BS MS: no deformity or atrophy; distal pulses are 2+ in all 4 extremities; she has chronic stasis changes in both lower extremities and varicose veins are seen on the left Skin: warm and dry, no rash Neuro:  Strength and sensation are intact Psych: euthymic mood, full affect   EKG:  EKG is ordered today. She is in sinus rhythm with no acute ischemic changes  Recent Labs: 04/02/2015: Magnesium 2.0 04/29/2015: BUN 20; Creatinine, Ser 1.04; Potassium 4.0; Sodium 141 05/30/2015: ALT 42* 07/07/2015: Hemoglobin 12.4; Platelets 249.0; TSH 4.39    Lipid Panel    Component Value Date/Time   CHOL 205* 02/26/2015 1123   CHOL 242* 07/31/2013 1154    TRIG 102 02/26/2015 1123   TRIG 154.0* 07/31/2013 1154   HDL 65 02/26/2015 1123   HDL 70.70 07/31/2013 1154   CHOLHDL 3 07/31/2013 1154   VLDL 30.8 07/31/2013 1154   LDLCALC 120* 02/26/2015 1123   LDLCALC 78 11/13/2011 0700   LDLDIRECT 152.6 07/31/2013 1154     Wt Readings from Last 3 Encounters:  08/19/15 181 lb 8 oz (82.328 kg)  07/22/15 183 lb 4.8 oz (83.144 kg)  07/07/15 180 lb (81.647 kg)     Other studies Reviewed: Additional studies/ records that were reviewed today include: Hospital records, chart notes and EKGs.  ASSESSMENT AND PLAN:  1.  Paroxysmal atrial fibrillation: The situation was discussed with the patient and her husband. It was reviewed with him that there was no significant problem with her being in atrial fibrillation as long as her heart rate is controlled and she is on blood thinners. She was reassured by this. She was advised that as long as her heart rate is controlled, she can participate in cardiac rehabilitation even when she's in atrial fibrillation as long she feels okay and her heart rate is okay. Because of her resting bradycardia, we will not increase her beta blocker or at any other rate control medications.  2. Chronic anticoagulation: Continue Eliquis, recheck BMET and CBC 4 weeks after initiation of this. This patients CHA2DS2-VASc Score and unadjusted Ischemic Stroke Rate (% per year) is equal to 7.2 % stroke rate/year from a score of 5 Above score calculated as 1 point each if present [CHF, HTN, DM, Vascular=MI/PAD/Aortic Plaque, Age if 65-74, or Female], 2 points each if present [Age > 75, or Stroke/TIA/TE]  Current medicines are reviewed at length with the patient today.  The patient does not have concerns regarding medicines.  The following changes have been made:  no change  Labs/ tests ordered today include:   Orders Placed This Encounter  Procedures  . CBC  . Basic Metabolic Panel (BMET)  . EKG 12-Lead  . ECHOCARDIOGRAM COMPLETE      Disposition:  FU with Dr. Stanford Breed; echocardiogram 1 year after surgery   Signed, Lenoard Aden  08/19/2015 6:15 PM    [facility] [address], [place], [place]  [address] Phone: [phone]; Fax: [phone]

## 2015-08-19 NOTE — Patient Instructions (Addendum)
Your physician recommends that you return for lab work in [place] and [place] has requested that you have an echocardiogram. Echocardiography is a painless test that uses sound waves to create images of your heart. It provides your doctor with information about the size and shape of your heart and how well your heart's chambers and valves are working. This procedure takes approximately one hour. There are no restrictions for this procedure. May 2017

## 2015-08-20 ENCOUNTER — Encounter: Payer: Self-pay | Admitting: Cardiology

## 2015-08-20 ENCOUNTER — Encounter (HOSPITAL_COMMUNITY): Payer: PPO

## 2015-08-20 ENCOUNTER — Encounter (HOSPITAL_COMMUNITY)
Admission: RE | Admit: 2015-08-20 | Discharge: 2015-08-20 | Disposition: A | Discharge status: Home / Self Care | Payer: PPO | Source: Ambulatory Visit | Attending: Cardiology | Admitting: Cardiology

## 2015-08-20 DIAGNOSIS — Z951 Presence of aortocoronary bypass graft: Secondary | ICD-10-CM | POA: Diagnosis not present

## 2015-08-22 ENCOUNTER — Encounter (HOSPITAL_COMMUNITY)
Admission: RE | Admit: 2015-08-22 | Discharge: 2015-08-22 | Disposition: A | Discharge status: Home / Self Care | Payer: PPO | Source: Ambulatory Visit | Attending: Cardiology | Admitting: Cardiology

## 2015-08-22 ENCOUNTER — Encounter (HOSPITAL_COMMUNITY): Payer: PPO

## 2015-08-22 DIAGNOSIS — Z951 Presence of aortocoronary bypass graft: Secondary | ICD-10-CM | POA: Diagnosis not present

## 2015-08-25 ENCOUNTER — Encounter (HOSPITAL_COMMUNITY)
Admission: RE | Admit: 2015-08-25 | Discharge: 2015-08-25 | Disposition: A | Discharge status: Home / Self Care | Payer: PPO | Source: Ambulatory Visit | Attending: Cardiology | Admitting: Cardiology

## 2015-08-25 ENCOUNTER — Encounter (HOSPITAL_COMMUNITY): Payer: PPO

## 2015-08-25 DIAGNOSIS — Z951 Presence of aortocoronary bypass graft: Secondary | ICD-10-CM | POA: Insufficient documentation

## 2015-08-27 ENCOUNTER — Encounter (HOSPITAL_COMMUNITY)
Admission: RE | Admit: 2015-08-27 | Discharge: 2015-08-27 | Disposition: A | Discharge status: Home / Self Care | Payer: PPO | Source: Ambulatory Visit | Attending: Cardiology | Admitting: Cardiology

## 2015-08-27 ENCOUNTER — Encounter (HOSPITAL_COMMUNITY): Payer: PPO

## 2015-08-27 DIAGNOSIS — Z951 Presence of aortocoronary bypass graft: Secondary | ICD-10-CM | POA: Diagnosis not present

## 2015-08-29 ENCOUNTER — Encounter (HOSPITAL_COMMUNITY)
Admission: RE | Admit: 2015-08-29 | Discharge: 2015-08-29 | Disposition: A | Discharge status: Home / Self Care | Payer: PPO | Source: Ambulatory Visit | Attending: Cardiology | Admitting: Cardiology

## 2015-08-29 ENCOUNTER — Encounter (HOSPITAL_COMMUNITY): Payer: PPO

## 2015-08-29 DIAGNOSIS — Z951 Presence of aortocoronary bypass graft: Secondary | ICD-10-CM | POA: Diagnosis not present

## 2015-09-01 ENCOUNTER — Encounter (HOSPITAL_COMMUNITY): Payer: PPO

## 2015-09-01 ENCOUNTER — Encounter (HOSPITAL_COMMUNITY)
Admission: RE | Admit: 2015-09-01 | Discharge: 2015-09-01 | Disposition: A | Discharge status: Home / Self Care | Payer: PPO | Source: Ambulatory Visit | Attending: Cardiology | Admitting: Cardiology

## 2015-09-01 DIAGNOSIS — Z951 Presence of aortocoronary bypass graft: Secondary | ICD-10-CM | POA: Diagnosis not present

## 2015-09-03 ENCOUNTER — Encounter (HOSPITAL_COMMUNITY)
Admission: RE | Admit: 2015-09-03 | Discharge: 2015-09-03 | Disposition: A | Discharge status: Home / Self Care | Payer: PPO | Source: Ambulatory Visit | Attending: Cardiology | Admitting: Cardiology

## 2015-09-03 DIAGNOSIS — Z951 Presence of aortocoronary bypass graft: Secondary | ICD-10-CM | POA: Diagnosis not present

## 2015-09-05 ENCOUNTER — Encounter (HOSPITAL_COMMUNITY)
Admission: RE | Admit: 2015-09-05 | Discharge: 2015-09-05 | Disposition: A | Discharge status: Home / Self Care | Payer: PPO | Source: Ambulatory Visit | Attending: Cardiology | Admitting: Cardiology

## 2015-09-05 DIAGNOSIS — Z951 Presence of aortocoronary bypass graft: Secondary | ICD-10-CM | POA: Diagnosis not present

## 2015-09-08 ENCOUNTER — Encounter (HOSPITAL_COMMUNITY)
Admission: RE | Admit: 2015-09-08 | Discharge: 2015-09-08 | Disposition: A | Discharge status: Home / Self Care | Payer: PPO | Source: Ambulatory Visit | Attending: Cardiology | Admitting: Cardiology

## 2015-09-08 DIAGNOSIS — Z951 Presence of aortocoronary bypass graft: Secondary | ICD-10-CM | POA: Diagnosis not present

## 2015-09-10 ENCOUNTER — Telehealth (HOSPITAL_COMMUNITY): Payer: Self-pay | Admitting: *Deleted

## 2015-09-10 ENCOUNTER — Encounter (HOSPITAL_COMMUNITY): Admission: RE | Admit: 2015-09-10 | Payer: PPO | Source: Ambulatory Visit

## 2015-09-12 ENCOUNTER — Encounter (HOSPITAL_COMMUNITY): Payer: PPO

## 2015-09-15 ENCOUNTER — Encounter (HOSPITAL_COMMUNITY): Payer: PPO

## 2015-09-16 ENCOUNTER — Ambulatory Visit (INDEPENDENT_AMBULATORY_CARE_PROVIDER_SITE_OTHER): Payer: PPO

## 2015-09-16 DIAGNOSIS — Z23 Encounter for immunization: Secondary | ICD-10-CM

## 2015-09-16 LAB — CBC
HEMATOCRIT: 40 % (ref 36.0–46.0)
Hemoglobin: 13 g/dL (ref 12.0–15.0)
MCH: 26.9 pg (ref 26.0–34.0)
MCHC: 32.5 g/dL (ref 30.0–36.0)
MCV: 82.6 fL (ref 78.0–100.0)
MPV: 10.2 fL (ref 8.6–12.4)
Platelets: 230 10*3/uL (ref 150–400)
RBC: 4.84 MIL/uL (ref 3.87–5.11)
RDW: 15.4 % (ref 11.5–15.5)
WBC: 5.6 10*3/uL (ref 4.0–10.5)

## 2015-09-17 ENCOUNTER — Encounter (HOSPITAL_COMMUNITY): Admission: RE | Admit: 2015-09-17 | Payer: PPO | Source: Ambulatory Visit

## 2015-09-17 ENCOUNTER — Telehealth (HOSPITAL_COMMUNITY): Payer: Self-pay | Admitting: *Deleted

## 2015-09-17 LAB — HEPATIC FUNCTION PANEL
ALK PHOS: 75 U/L (ref 33–130)
ALT: 34 U/L — AB (ref 6–29)
AST: 35 U/L (ref 10–35)
Albumin: 4.2 g/dL (ref 3.6–5.1)
BILIRUBIN INDIRECT: 0.5 mg/dL (ref 0.2–1.2)
Bilirubin, Direct: 0.1 mg/dL (ref <=0.2)
Total Bilirubin: 0.6 mg/dL (ref 0.2–1.2)
Total Protein: 6.9 g/dL (ref 6.1–8.1)

## 2015-09-17 LAB — BASIC METABOLIC PANEL
BUN: 25 mg/dL (ref 7–25)
CO2: 31 mmol/L (ref 20–31)
CREATININE: 1.03 mg/dL — AB (ref 0.60–0.93)
Calcium: 10 mg/dL (ref 8.6–10.4)
Chloride: 104 mmol/L (ref 98–110)
Glucose, Bld: 86 mg/dL (ref 65–99)
Potassium: 4.5 mmol/L (ref 3.5–5.3)
Sodium: 142 mmol/L (ref 135–146)

## 2015-09-19 ENCOUNTER — Encounter (HOSPITAL_COMMUNITY): Payer: PPO

## 2015-09-22 ENCOUNTER — Encounter (HOSPITAL_COMMUNITY): Payer: PPO

## 2015-09-24 ENCOUNTER — Encounter (HOSPITAL_COMMUNITY): Payer: PPO

## 2015-09-25 ENCOUNTER — Ambulatory Visit (INDEPENDENT_AMBULATORY_CARE_PROVIDER_SITE_OTHER): Payer: PPO | Admitting: Internal Medicine

## 2015-09-25 ENCOUNTER — Encounter: Payer: Self-pay | Admitting: Internal Medicine

## 2015-09-25 VITALS — BP 110/70 | HR 50 | Temp 98.2°F | Wt 180.0 lb

## 2015-09-25 DIAGNOSIS — M1711 Unilateral primary osteoarthritis, right knee: Secondary | ICD-10-CM | POA: Diagnosis not present

## 2015-09-25 DIAGNOSIS — M1712 Unilateral primary osteoarthritis, left knee: Secondary | ICD-10-CM | POA: Diagnosis not present

## 2015-09-25 DIAGNOSIS — Z23 Encounter for immunization: Secondary | ICD-10-CM | POA: Diagnosis not present

## 2015-09-25 NOTE — Addendum Note (Signed)
Addended by: Lowella Dandy on: 09/25/2015 02:35 PM   Modules accepted: Orders

## 2015-09-25 NOTE — Patient Instructions (Signed)
Use an anti-inflammatory cream such as Aspercreme or Zostrix cream twice a day to the affected area as needed. In lieu of this warm moist compresses or  hot water bottle can be used. Do not apply ice .Consider glucosamine sulfate 1500 mg daily for joint symptoms. Take this daily  for 3 months and then leave it off for 2 months. This will rehydrate the cartilages. The Orthopedic referral will be scheduled and you'll be notified of the time.Please call the Referral Co-Ordinator @ [phone] if you have not been notified of appointment time within 7-10 days.

## 2015-09-25 NOTE — Progress Notes (Signed)
Pre visit review using our clinic review tool, if applicable. No additional management support is needed unless otherwise documented below in the visit note. 

## 2015-09-25 NOTE — Progress Notes (Signed)
   Subjective:    Patient ID: Valerie Rollins, female    DOB: January 05, 1936, 79 y.o.   MRN: [id number]  HPI She's had right low back pain and right knee pain for 3 weeks. There was no trigger or injury for this. She's been seeing a Chiropractor who told her that her pelvis was tilted and one leg was shorter than the other. Yesterday she began to have swelling of the right knee. She has chronic ankle edema.  The knee and back pain are worse after she sits for a period of time.   Review of Systems Fever, chills, sweats, or unexplained weight loss not present. No significant headaches. Mental status change or memory loss denied. Blurred vision , diplopia or vision loss absent. Vertigo, near syncope or imbalance denied. There is no numbness, tingling, or weakness in extremities.   No loss of control of bladder or bowels. Radicular type pain absent. No seizure stigmata.     Objective:   Physical Exam Pertinent or positive findings include: She has marked vascular spiders of the left foot. She has bilateral lipedema, greater on the right than the left. There is also lipomatous tissue at the knees above the patellae, greater on the right than the left. She has marked varicose veins of RLE which do empty with elevation. There is severe crepitus of the right knee. Pedal pulses are essentially not palpable.   General appearance :adequately nourished; in no distress.  Eyes: No conjunctival inflammation or scleral icterus is present.  Heart:  Normal rate and regular rhythm. S1 and S2 normal without gallop, murmur, click, rub or other extra sounds    Lungs:Chest clear to auscultation; no wheezes, rhonchi,rales ,or rubs present.No increased work of breathing.   Abdomen: bowel sounds normal, soft and non-tender without masses, organomegaly or hernias noted.  No guarding or rebound. No flank tenderness to percussion.  Vascular : all pulses equal ; no bruits present.  Skin:Warm & dry.  Intact without  suspicious lesions or rashes ; no tenting or jaundice   Lymphatic: No lymphadenopathy is noted about the head, neck, axilla.   Neuro: Strength, tone decreased generally. DTRs normal.     Assessment & Plan:  #1 degenerative joint disease, end-stage right knee  Plan: Orthopedic referral; trial of topical Zostrix and oral glucosamine. She's not a candidate for anti-inflammatory agents orally because of the novel anticoagulant she takes.

## 2015-09-26 ENCOUNTER — Encounter (HOSPITAL_COMMUNITY): Payer: PPO

## 2015-09-29 ENCOUNTER — Encounter (HOSPITAL_COMMUNITY)
Admission: RE | Admit: 2015-09-29 | Discharge: 2015-09-29 | Disposition: A | Discharge status: Home / Self Care | Payer: PPO | Source: Ambulatory Visit | Attending: Cardiology | Admitting: Cardiology

## 2015-09-29 DIAGNOSIS — Z951 Presence of aortocoronary bypass graft: Secondary | ICD-10-CM | POA: Diagnosis not present

## 2015-09-29 NOTE — Progress Notes (Signed)
Valerie Rollins returned to exercise at cardiac rehab and exercised without difficulty.

## 2015-09-30 ENCOUNTER — Telehealth: Payer: Self-pay | Admitting: Internal Medicine

## 2015-09-30 DIAGNOSIS — M25561 Pain in right knee: Secondary | ICD-10-CM

## 2015-09-30 NOTE — Telephone Encounter (Signed)
Patient is checking up on a referral.  I do not see one entered in the system.  She has lots of questions about this.  Can you please follow up in regards.  States wrong knee was noted in chart as well.  Patient states she will not in tomorrow from 2:00 to 4:30.  She is requesting a call before or after that time.  Patient states she can not find the glucosamine that Dr. Linna Darner was wanting her to take as well.  Would like to know what to do.

## 2015-10-01 ENCOUNTER — Encounter (HOSPITAL_COMMUNITY): Payer: PPO

## 2015-10-01 ENCOUNTER — Telehealth (HOSPITAL_COMMUNITY): Payer: Self-pay | Admitting: Internal Medicine

## 2015-10-01 NOTE — Telephone Encounter (Signed)
glucosamine sulfate ( may be in combo with Chondroitin) is OTC. Dose is 1500 mg daily for joint symptoms. Take this daily  for 3 months and then leave it off for 2 months. This will rehydrate the cartilages.

## 2015-10-01 NOTE — Telephone Encounter (Signed)
Stef entered it today [facility] if on plan

## 2015-10-01 NOTE — Telephone Encounter (Signed)
Referral pended.  Please advise on the glucosamine question and review the OV notes regarding which knee it is.

## 2015-10-01 NOTE — Telephone Encounter (Signed)
Spoke with pt to inform. Pt stated that she wanted to get in with Antionette Char, I told her, like Linus Orn said it would depend on the insurance and who had an appt available. Please advise on the referral.

## 2015-10-02 NOTE — Telephone Encounter (Signed)
Referral faxed to [facility].

## 2015-10-02 NOTE — Telephone Encounter (Signed)
Reviewed notes from last OV. Notes indicate that the dx is right knee pain.

## 2015-10-03 ENCOUNTER — Encounter (HOSPITAL_COMMUNITY)
Admission: RE | Admit: 2015-10-03 | Discharge: 2015-10-03 | Disposition: A | Discharge status: Home / Self Care | Payer: PPO | Source: Ambulatory Visit | Attending: Cardiology | Admitting: Cardiology

## 2015-10-03 ENCOUNTER — Telehealth: Payer: Self-pay | Admitting: Cardiology

## 2015-10-03 DIAGNOSIS — Z951 Presence of aortocoronary bypass graft: Secondary | ICD-10-CM | POA: Diagnosis not present

## 2015-10-03 MED ORDER — LOSARTAN POTASSIUM 25 MG PO TABS: 25.0000 mg | ORAL_TABLET | Freq: Every day | ORAL | Status: DC

## 2015-10-03 NOTE — Telephone Encounter (Signed)
°*  STAT* If patient is at the pharmacy, call can be transferred to refill team.   1. Which medications need to be refilled? (please list name of each medication and dose if known) Losartan  2. Which pharmacy/location (including street and city if local pharmacy) is medication to be sent to? [facility] on [address]  3. Do they need a 30 day or 90 day supply? She would like a 12  Pt also called in stating that she will be having cataract surgery on 11/29 with Dr. Kathrin Penner and she wanted to know should she continue to take her Eliquis while having this procedure. Please f/u with pt  Thanks

## 2015-10-03 NOTE — Telephone Encounter (Signed)
Left message for pt, refill sent to the pharmacy. Pt can have cataract surgery on eliquis.

## 2015-10-06 ENCOUNTER — Encounter (HOSPITAL_COMMUNITY)
Admission: RE | Admit: 2015-10-06 | Discharge: 2015-10-06 | Disposition: A | Discharge status: Home / Self Care | Payer: PPO | Source: Ambulatory Visit | Attending: Cardiology | Admitting: Cardiology

## 2015-10-06 DIAGNOSIS — Z951 Presence of aortocoronary bypass graft: Secondary | ICD-10-CM | POA: Diagnosis not present

## 2015-10-08 ENCOUNTER — Telehealth (HOSPITAL_COMMUNITY): Payer: Self-pay | Admitting: Internal Medicine

## 2015-10-08 ENCOUNTER — Encounter (HOSPITAL_COMMUNITY): Payer: PPO

## 2015-10-09 ENCOUNTER — Telehealth: Payer: Self-pay | Admitting: Internal Medicine

## 2015-10-09 DIAGNOSIS — M810 Age-related osteoporosis without current pathological fracture: Secondary | ICD-10-CM

## 2015-10-09 NOTE — Telephone Encounter (Signed)
Patient is requesting to have a dexa.  Please advise.

## 2015-10-09 NOTE — Progress Notes (Addendum)
Pt graduated from cardiac rehab program tomorrow with completion of 36 exercise sessions in Phase II. Pt maintained good attendance and progressed nicely during his participation in rehab as evidenced by increased MET level.   Medication list reconciled. Repeat  PHQ score- 0 .  Pt has made significant lifestyle changes and should be commended for her success. Pt feels she has achieved her goals during cardiac rehab. Valerie Rollins did have some difficulty with knee pain while in the program  Pt plans to continue exercise via walking with her husband.

## 2015-10-10 ENCOUNTER — Encounter (HOSPITAL_COMMUNITY)
Admission: RE | Admit: 2015-10-10 | Discharge: 2015-10-10 | Disposition: A | Discharge status: Home / Self Care | Payer: PPO | Source: Ambulatory Visit | Attending: Cardiology | Admitting: Cardiology

## 2015-10-10 DIAGNOSIS — Z951 Presence of aortocoronary bypass graft: Secondary | ICD-10-CM | POA: Diagnosis not present

## 2015-10-10 NOTE — Telephone Encounter (Signed)
Left patient vm to call back to schedule.  °

## 2015-10-22 ENCOUNTER — Telehealth: Payer: Self-pay | Admitting: Cardiology

## 2015-10-22 NOTE — Telephone Encounter (Signed)
Returned call to patient.Patient stated during cataract surgery yesterday she had AFib.Stated Dr.said rate was slow and not fast.Stated she was told to call and let Dr.Crenshaw know he might need to adjust medication.Stated she feels fine today.Stated she does not know when she goes into AFib.Message sent to [place].

## 2015-10-22 NOTE — Telephone Encounter (Signed)
Continue present meds Kirk Ruths

## 2015-10-22 NOTE — Telephone Encounter (Signed)
Mrs. Feutz is calling because she had cataract surgery on yesterday. During the monitoring while she was having the surgery she went into Afib and it was on the low side . Please call   Thanks

## 2015-10-22 NOTE — Telephone Encounter (Signed)
Spoke with pt, Aware of dr crenshaw's recommendations.  °

## 2015-10-29 ENCOUNTER — Telehealth: Payer: Self-pay | Admitting: Cardiology

## 2015-10-29 ENCOUNTER — Encounter: Payer: Self-pay | Admitting: Cardiology

## 2015-10-29 NOTE — Telephone Encounter (Signed)
Will forward for dr crenshaw review  

## 2015-10-29 NOTE — Telephone Encounter (Signed)
Clearance faxed to [phone]

## 2015-10-29 NOTE — Telephone Encounter (Signed)
Pt needs clearance for pt to stop his Eliquis 3 days before his back injection.This is scheduled for 11-18-15.

## 2015-10-29 NOTE — Telephone Encounter (Signed)
Ok to hold apixaban 3 days prior to procedure and resume after when ok with person doing procedure Kirk Ruths

## 2015-10-29 NOTE — Telephone Encounter (Signed)
Left message for deanna, will need fax number to send clearance.

## 2015-11-05 ENCOUNTER — Telehealth: Payer: Self-pay

## 2015-11-05 ENCOUNTER — Ambulatory Visit (INDEPENDENT_AMBULATORY_CARE_PROVIDER_SITE_OTHER): Payer: PPO | Admitting: Internal Medicine

## 2015-11-05 ENCOUNTER — Encounter: Payer: Self-pay | Admitting: Internal Medicine

## 2015-11-05 ENCOUNTER — Ambulatory Visit (INDEPENDENT_AMBULATORY_CARE_PROVIDER_SITE_OTHER)
Admission: RE | Admit: 2015-11-05 | Discharge: 2015-11-05 | Disposition: A | Discharge status: Home / Self Care | Payer: PPO | Source: Ambulatory Visit | Attending: Internal Medicine | Admitting: Internal Medicine

## 2015-11-05 VITALS — BP 162/88 | HR 76 | Ht 65.0 in | Wt 180.0 lb

## 2015-11-05 DIAGNOSIS — M810 Age-related osteoporosis without current pathological fracture: Secondary | ICD-10-CM

## 2015-11-05 DIAGNOSIS — K5730 Diverticulosis of large intestine without perforation or abscess without bleeding: Secondary | ICD-10-CM

## 2015-11-05 DIAGNOSIS — K588 Other irritable bowel syndrome: Secondary | ICD-10-CM

## 2015-11-05 DIAGNOSIS — K219 Gastro-esophageal reflux disease without esophagitis: Secondary | ICD-10-CM

## 2015-11-05 DIAGNOSIS — R159 Full incontinence of feces: Secondary | ICD-10-CM | POA: Diagnosis not present

## 2015-11-05 DIAGNOSIS — K589 Irritable bowel syndrome without diarrhea: Secondary | ICD-10-CM

## 2015-11-05 MED ORDER — DICYCLOMINE HCL 20 MG PO TABS: 20.0000 mg | ORAL_TABLET | Freq: Four times a day (QID) | ORAL | Status: DC | PRN

## 2015-11-05 NOTE — Patient Instructions (Signed)
We have sent the following medications to your pharmacy for you to pick up at your convenience:  Bentyl  

## 2015-11-05 NOTE — Telephone Encounter (Signed)
Prior authorization for Bentyl sent to Cover my Meds.

## 2015-11-05 NOTE — Progress Notes (Signed)
HISTORY OF PRESENT ILLNESS:  Valerie Rollins is a 79 y.o. female with hypertension, hard DVT, IBS, GERD, and intermittent fecal soilage due to pelvic floor relaxation. She was last evaluated in the office 11/30/2013. See that dictation for details. She presents today with her daughter for follow-up regarding her chronic conditions and request of medication refill. For chronic GERD she is maintained on omeprazole 20 mg daily. On medication symptoms are well controlled. No dysphagia. No appreciable medication side effect. Regarding IBS, she has had less problems with loose stools. Actually, tendency toward constipation. No longer taking Colestid. She does use dicyclomine intermittently for abdominal cramping discomfort. She does request refill this medication. Her last colonoscopy was performed 2012 with diverticulosis only. She does have questions regarding diet for diverticulosis. Since her last visit she did have open heart surgery for atrial myxoma. GI review of systems is otherwise negative.  REVIEW OF SYSTEMS:  All non-GI ROS negative except for arthritis, back pain, heart murmur, heart rhythm change, ankle swelling  Past Medical History  Diagnosis Date  . Diverticulitis 2002    based on CT scan  . Hyperlipidemia   . Hypertension   . Superficial phlebitis      X 2, 1999, 2000  . Ocular hypertension     glaucoma suspect, Dr. Kathrin Penner  . Skin cancer   . Endometriosis   . Gastritis 2006    @ Endo  . Hiatal hernia   . Diverticulosis     Dr Henrene Pastor  . GERD (gastroesophageal reflux disease)   . DVT (deep venous thrombosis) ([employer]) 2000    no trigger  . Syncope 11/12/11    in context CAP . Carotid Doppler exam was negative. 2D echocardiogram revealed mild dilation of the left atrium and moderate increase in the systolic pressureof  the pulmonary artery  . Atrial fibrillation ([place])     Post op after repair of ankle fracture  . UTI (lower urinary tract infection) 01/2013    Strep bovis ; PCN  sensitive  . IBS (irritable bowel syndrome)   . CAD (coronary artery disease) 03/2015    LAD 80%, otherwise minimal CAD. s/p LIMA-LAD  . 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 Total abdominal hysterectomy w/ bilateral salpingoophorectomy  1980    dysfunctional menses, endometriosis  . Varicose vein surgery      1960, 1965, 2009  . Tonsillectomy    . Cataract extraction w/ intraocular lens implant      OS; Dr Kathrin Penner  . Colonoscopy  1995, 2002, 2012    diverticulosis; Dr Henrene Pastor  . Orif ankle fracture  11/14/2011    Procedure: OPEN REDUCTION INTERNAL FIXATION (ORIF) ANKLE FRACTURE;  Surgeon: Colin Rhein;  Location: [facility];  Service: Orthopedics;  Laterality: Left;  open reduction internal fixation trimalleolar ankle fracture  . Cardiac catheterization N/A 03/28/2015    Procedure: Left Heart Cath and Coronary Angiography;  Surgeon: Jolaine Artist, MD;  Location: [facility] INVASIVE CV LAB CUPID;  Service: Cardiovascular;  Laterality: N/A;  . Coronary artery bypass graft N/A 04/01/2015    Procedure: CORONARY ARTERY BYPASS GRAFTING (CABG), ON PUMP, TIMES ONE, USING LEFT INTERNAL MAMMARY ARTERY;  Surgeon: Ivin Poot, MD;  Location: [place];  Service: Open Heart Surgery;  Laterality: N/A;  . Excision of atrial myxoma N/A 04/01/2015    Procedure: EXCISION OF ATRIAL MYXOMA;  Surgeon: Tharon Aquas Trigt,  MD;  Location: [facility];  Service: Open Heart Surgery;  Laterality: N/A;  . Tee without cardioversion N/A 04/01/2015    Procedure: TRANSESOPHAGEAL ECHOCARDIOGRAM (TEE);  Surgeon: Ivin Poot, MD;  Location: [place];  Service: Open Heart Surgery;  Laterality: N/A;  . Cataract extraction Bilateral     Social History EARNEST ARMFIELD  reports that she has never smoked. She has never used smokeless tobacco. She reports that she does not drink alcohol or use illicit drugs.  family history includes Alzheimer's disease  in her mother; Colon cancer in her maternal grandmother; Diverticulitis in her daughter; Heart attack (age of onset: 66) in her father; Heart attack (age of onset: 55) in her paternal grandmother; Hyperlipidemia in her brother; Hypertension in her brother. There is no history of Diabetes or Stroke.  Allergies  Allergen Reactions  . Oxybutynin Chloride     Tongue swelling; Rx from Dr Hessie Diener  . Propoxyphene N-Acetaminophen     REACTION: made pt blood to thin  . Alendronate Sodium     ? reaction  . Ramipril     REACTION: cough       PHYSICAL EXAMINATION: Vital signs: BP 162/88 mmHg  Pulse 76  Ht 5\' 5"  (1.651 m)  Wt 180 lb (81.647 kg)  BMI 29.95 kg/m2  Constitutional: Pleasant, generally well-appearing, no acute distress Psychiatric: alert and oriented x3, cooperative Eyes: extraocular movements intact, anicteric, conjunctiva pink Mouth: oral pharynx moist, no lesions Neck: supple without thyromegaly Lymph no lymphadenopathy Cardiovascular: heart regular rate and rhythm, no murmur Lungs: clear to auscultation bilaterally Abdomen: soft, obese, nontender, nondistended, no obvious ascites, no peritoneal signs, normal bowel sounds, no organomegaly Rectal: Omitted Extremities: no clubbing cyanosis. Trace lower extremity edema bilaterally Skin: no lesions on visible extremities Neuro: No focal deficits. Cranial nerves intact.   ASSESSMENT:  #1. IBS. Using dicyclomine on demand for cramping discomfort #2. GERD. Controlled with PPI #3. Issues with fecal incontinence. Improved with resolution of diarrhea #4. Diverticulosis   PLAN:  #1. High-fiber diet #2. Refill dicyclomine 20 mg every 6 hours as needed for pain #3. Reflux precautions #4. Continue PPI. Refill as needed #5. Discussion on diverticular disease. High-fiber diet recommended #6. Routine GI follow-up 2 years. Sooner if needed   25 minutes was spent face-to-face with the patient. Greater than 50% of the time use for  counseling regarding her GI conditions and answering her questions and her daughter's questions.

## 2015-11-10 NOTE — Telephone Encounter (Signed)
Re-faxed prior authorization for Bentyl per insurance company's request.

## 2015-11-12 NOTE — Telephone Encounter (Signed)
Received denial from [employer] because prior auth for Bentyl was sent to the wrong place.  Refaxed form to correct location

## 2015-11-19 NOTE — Telephone Encounter (Signed)
Dicylomine (Bentyl) prior authoization approved.

## 2015-11-23 ENCOUNTER — Other Ambulatory Visit: Payer: Self-pay | Admitting: Cardiology

## 2015-11-25 NOTE — Telephone Encounter (Signed)
Rx request sent to pharmacy.  

## 2015-12-02 DIAGNOSIS — M5136 Other intervertebral disc degeneration, lumbar region: Secondary | ICD-10-CM | POA: Diagnosis not present

## 2015-12-05 DIAGNOSIS — M5136 Other intervertebral disc degeneration, lumbar region: Secondary | ICD-10-CM | POA: Diagnosis not present

## 2015-12-09 DIAGNOSIS — M5136 Other intervertebral disc degeneration, lumbar region: Secondary | ICD-10-CM | POA: Diagnosis not present

## 2015-12-12 ENCOUNTER — Ambulatory Visit: Payer: PPO | Admitting: Internal Medicine

## 2015-12-12 DIAGNOSIS — M5136 Other intervertebral disc degeneration, lumbar region: Secondary | ICD-10-CM | POA: Diagnosis not present

## 2015-12-19 DIAGNOSIS — M5136 Other intervertebral disc degeneration, lumbar region: Secondary | ICD-10-CM | POA: Diagnosis not present

## 2015-12-23 ENCOUNTER — Other Ambulatory Visit: Payer: Self-pay | Admitting: Cardiology

## 2015-12-26 ENCOUNTER — Ambulatory Visit (INDEPENDENT_AMBULATORY_CARE_PROVIDER_SITE_OTHER): Payer: PPO | Admitting: Internal Medicine

## 2015-12-26 ENCOUNTER — Encounter: Payer: Self-pay | Admitting: Internal Medicine

## 2015-12-26 VITALS — BP 134/76 | HR 61 | Temp 98.0°F | Resp 16 | Wt 181.0 lb

## 2015-12-26 DIAGNOSIS — R946 Abnormal results of thyroid function studies: Secondary | ICD-10-CM

## 2015-12-26 DIAGNOSIS — M810 Age-related osteoporosis without current pathological fracture: Secondary | ICD-10-CM | POA: Insufficient documentation

## 2015-12-26 DIAGNOSIS — I2583 Coronary atherosclerosis due to lipid rich plaque: Secondary | ICD-10-CM

## 2015-12-26 DIAGNOSIS — M8580 Other specified disorders of bone density and structure, unspecified site: Secondary | ICD-10-CM

## 2015-12-26 DIAGNOSIS — I4891 Unspecified atrial fibrillation: Secondary | ICD-10-CM | POA: Diagnosis not present

## 2015-12-26 DIAGNOSIS — R7989 Other specified abnormal findings of blood chemistry: Secondary | ICD-10-CM

## 2015-12-26 DIAGNOSIS — E785 Hyperlipidemia, unspecified: Secondary | ICD-10-CM

## 2015-12-26 DIAGNOSIS — I10 Essential (primary) hypertension: Secondary | ICD-10-CM

## 2015-12-26 DIAGNOSIS — I2510 Atherosclerotic heart disease of native coronary artery without angina pectoris: Secondary | ICD-10-CM | POA: Diagnosis not present

## 2015-12-26 MED ORDER — FUROSEMIDE 40 MG PO TABS: 40.0000 mg | ORAL_TABLET | Freq: Every day | ORAL | Status: DC

## 2015-12-26 NOTE — Progress Notes (Signed)
Pre visit review using our clinic review tool, if applicable. No additional management support is needed unless otherwise documented below in the visit note. 

## 2015-12-26 NOTE — Assessment & Plan Note (Signed)
Check lipid, cmp Taking lipitor 10 mg dialy Urged her to increase her exercise - walking

## 2015-12-26 NOTE — Assessment & Plan Note (Signed)
Will check tsh 

## 2015-12-26 NOTE — Assessment & Plan Note (Signed)
Osteopenia with significant dec in bone density Previous pcp recommended prolia or reclast (took fosamax at one point and did not tolerate it) She is taking calcium and vit d daily Not exercising regularly - only walks in fair weather I think she could go without medication, but needs to exercise daily She would like to consider prolia - will see what her copay will be

## 2015-12-26 NOTE — Assessment & Plan Note (Signed)
BP controlled here today and waas controlled at cardiac rehab  Continue current medications

## 2015-12-26 NOTE — Patient Instructions (Addendum)
Try to get 2000 units of vitamin D daily.  You can try taking zantac (ranitidine ) daily for your heartburn instead of the omeprazole.  Zantac is safer long term.  The most important thing is to keep your heartburn well controlled.   We have reviewed your prior records including labs and tests today.  Test(s) ordered today. Your results will be released to [place] (or called to you) after review, usually within 72hours after test completion. If any changes need to be made, you will be notified at that same time.   Medications reviewed and updated.  No changes recommended at this time.  Your prescription(s) have been submitted to your pharmacy. Please take as directed and contact our office if you believe you are having problem(s) with the medication(s).   Please schedule followup in 6 months

## 2015-12-26 NOTE — Progress Notes (Signed)
Subjective:    Patient ID: Valerie Rollins, female    DOB: Jul 25, 1936, 80 y.o.   MRN: [id number]  HPI She is here to establish with a new pcp.  She is here for follow up.    CAD, atrial myxoma, Afib, Htn:  She follows with cardiology.  She takes her medications daily.   Hyperlipidemia: She is taking her medication daily. She is compliant with a low fat/cholesterol diet. She is exercising regularly. She denies myalgias.   GERD:  She is taking her medication daily as prescribed.  She denies any GERD symptoms and feels her GERD is well controlled. She has GERD symptoms on occasion.  Osteopenia:  She just had a dexa and had significant decrease in bone density.  She still has osteopenia.  She is taking her calcium and vitmain d daily.  She does not walk regularly - only in fiar weather.    Edema:  She takes lasix 20 mg daily.  She has sometimes been taking an extra pill and sometimes it helps and sometimes it does not.    Scoliois: in cervical and lumbar regions.  She sees dr Nelva Bush and he prescribed tramadol.    Medications and allergies reviewed with patient and updated if appropriate.  Patient Active Problem List   Diagnosis Date Noted  . Mass of neck 07/07/2015  . Encounter for therapeutic drug monitoring 05/14/2015  . Long-term (current) use of anticoagulants 04/16/2015  . S/P CABG x 1 04/01/2015  . CAD (coronary artery disease) 03/28/2015  . 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 Abnormal thyroid blood test 03/02/2015  . Cardiomegaly 02/20/2015  . Hypotension, postural 06/07/2013  . DDD (degenerative disc disease), lumbar 07/20/2012  . Atrial fibrillation ([place]) 11/15/2011  . Syncope 11/12/2011  . Hypokalemia 11/12/2011  . RBBB (right bundle branch block) 11/12/2011  . HTN (hypertension), malignant 11/12/2011  . Ankle fracture, left 11/12/2011  . HOARSENESS 02/23/2010  . SKIN CANCER, HX OF 11/18/2009  . Diverticulosis of large intestine 10/31/2008  . HEART MURMUR, SYSTOLIC [id number]    . VARICOSE VEINS, LOWER EXTREMITIES 03/13/2008  . GERD 02/20/2008  . IBS 02/20/2008  . OSTEOARTHRITIS 02/20/2008  . Hyperlipidemia 12/01/2006  . Essential hypertension 12/01/2006  . SUPERFICIAL THROMBOPHLEBITIS 12/01/2006  . DVT, HX OF 12/01/2006  . Personal history of other diseases of digestive system 12/01/2006    Current Outpatient Prescriptions on File Prior to Visit  Medication Sig Dispense Refill  . apixaban (ELIQUIS) 5 MG TABS tablet Take 1 tablet (5 mg total) by mouth 2 (two) times daily. 56 tablet 0  . atorvastatin (LIPITOR) 10 MG tablet Take 1 tablet (10 mg total) by mouth daily at 6 PM. 30 tablet 11  . calcium citrate-vitamin D (CITRACAL+D) 315-200 MG-UNIT per tablet Take 2 tablets by mouth 2 (two) times daily.    . Cholecalciferol (VITAMIN D3) 1000 UNITS CAPS Take 1,000 Units by mouth daily.     . COMBIVENT RESPIMAT 20-100 MCG/ACT AERS respimat Inhale 1 puff into the lungs as needed.  5  . dicyclomine (BENTYL) 20 MG tablet Take 1 tablet (20 mg total) by mouth every 6 (six) hours as needed for spasms. 30 tablet 1  . docusate sodium (COLACE) 100 MG capsule Take 100 mg by mouth daily.    . fexofenadine (ALLEGRA) 180 MG tablet Take 180 mg by mouth daily. Pt takes daily    . furosemide (LASIX) 20 MG tablet Take 20 mg by mouth daily.    Marland Kitchen latanoprost (XALATAN) 0.005 % ophthalmic  solution Place 1 drop into both eyes daily.      Marland Kitchen losartan (COZAAR) 25 MG tablet Take 1 tablet (25 mg total) by mouth daily. 90 tablet 3  . metoprolol tartrate (LOPRESSOR) 25 MG tablet TAKE 1 & 1/2 TABLETS BY MOUTH TWICE A DAY 90 tablet 0  . montelukast (SINGULAIR) 10 MG tablet Take 1 tablet (10 mg total) by mouth at bedtime. 90 tablet 1  . Multiple Vitamin (MULITIVITAMIN WITH MINERALS) TABS Take 1 tablet by mouth daily.    Marland Kitchen omeprazole (PRILOSEC) 20 MG capsule TAKE ONE CAPSULE BY MOUTH ONE TIME DAILY 90 capsule 3  . traMADol (ULTRAM) 50 MG tablet Take 1 tablet (50 mg total) by mouth every 6 (six) hours  as needed. Dr.Ramos (Patient taking differently: Take 50 mg by mouth every 6 (six) hours as needed. Dr.Ramos; pt takes 1 tablet every night, maybe 1 during the day as needed) 30 tablet 0   Current Facility-Administered Medications on File Prior to Visit  Medication Dose Route Frequency Provider Last Rate Last Dose  . pneumococcal 13-valent conjugate vaccine (PREVNAR 13) injection 0.5 mL  0.5 mL Intramuscular Tomorrow-1000 Hendricks Limes, MD        Past Medical History  Diagnosis Date  . Diverticulitis 2002    based on CT scan  . Hyperlipidemia   . Hypertension   . Superficial phlebitis      X 2, 1999, 2000  . Ocular hypertension     glaucoma suspect, Dr. Kathrin Penner  . Skin cancer   . Endometriosis   . Gastritis 2006    @ Endo  . Hiatal hernia   . Diverticulosis     Dr Henrene Pastor  . GERD (gastroesophageal reflux disease)   . DVT (deep venous thrombosis) ([place]) 2000    no trigger  . Syncope 11/12/11    in context CAP . Carotid Doppler exam was negative. 2D echocardiogram revealed mild dilation of the left atrium and moderate increase in the systolic pressureof  the pulmonary artery  . Atrial fibrillation ([place])     Post op after repair of ankle fracture  . UTI (lower urinary tract infection) 01/2013    Strep bovis ; PCN sensitive  . IBS (irritable bowel syndrome)   . CAD (coronary artery disease) 03/2015    LAD 80%, otherwise minimal CAD. s/p LIMA-LAD  . 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 Total abdominal hysterectomy w/ bilateral salpingoophorectomy  1980    dysfunctional menses, endometriosis  . Varicose vein surgery      1960, 1965, 2009  . Tonsillectomy    . Cataract extraction w/ intraocular lens implant      OS; Dr Kathrin Penner  . Colonoscopy  1995, 2002, 2012    diverticulosis; Dr Henrene Pastor  . Orif ankle fracture  11/14/2011    Procedure: OPEN REDUCTION INTERNAL FIXATION (ORIF)  ANKLE FRACTURE;  Surgeon: Valerie Rhein;  Location: [facility];  Service: Orthopedics;  Laterality: Left;  open reduction internal fixation trimalleolar ankle fracture  . Cardiac catheterization N/A 03/28/2015    Procedure: Left Heart Cath and Coronary Angiography;  Surgeon: Jolaine Artist, MD;  Location: [facility] INVASIVE CV LAB CUPID;  Service: Cardiovascular;  Laterality: N/A;  . Coronary artery bypass graft N/A 04/01/2015    Procedure: CORONARY ARTERY BYPASS GRAFTING (CABG), ON PUMP, TIMES ONE, USING LEFT INTERNAL MAMMARY ARTERY;  Surgeon: Ivin Poot, MD;  Location: [facility];  Service: Open Heart Surgery;  Laterality: N/A;  . Excision of atrial myxoma N/A 04/01/2015    Procedure: EXCISION OF ATRIAL MYXOMA;  Surgeon: Ivin Poot, MD;  Location: [place];  Service: Open Heart Surgery;  Laterality: N/A;  . Tee without cardioversion N/A 04/01/2015    Procedure: TRANSESOPHAGEAL ECHOCARDIOGRAM (TEE);  Surgeon: Ivin Poot, MD;  Location: [place];  Service: Open Heart Surgery;  Laterality: N/A;  . Cataract extraction Bilateral     Social History   Social History  . Marital Status: Married    Spouse Name: N/A  . Number of Children: 2  . Years of Education: N/A   Occupational History  . Retired    Social History Main Topics  . Smoking status: Never Smoker   . Smokeless tobacco: Never Used  . Alcohol Use: No  . Drug Use: No  . Sexual Activity: Not Asked   Other Topics Concern  . None   Social History Narrative   REG EXERCISE   HAD COLONOSCOPY AND ENDOSCOPY DONE DATES UNKNOWN    Family History  Problem Relation Age of Onset  . Heart attack Father 64  . Alzheimer's disease Mother     TIAs  . Hyperlipidemia Brother   . Hypertension Brother   . Diverticulitis Daughter     S/P colectomy  . Colon cancer Maternal Grandmother   . Heart attack Paternal Grandmother 82  . Diabetes Neg Hx   . Stroke Neg Hx     Review of Systems  Constitutional: Negative for fever and chills.    Respiratory: Positive for wheezing (occasional at night). Negative for cough and shortness of breath.   Cardiovascular: Positive for leg swelling (chronic). Negative for chest pain and palpitations.  Neurological: Negative for dizziness, light-headedness and headaches.       Objective:   Filed Vitals:   12/26/15 1412  BP: 134/76  Pulse: 61  Temp: 98 F (36.7 C)  Resp: 16   Filed Weights   12/26/15 1412  Weight: 181 lb (82.101 kg)   Body mass index is 30.12 kg/(m^2).   Physical Exam Constitutional: Appears well-developed and well-nourished. No distress.  Neck: Neck supple. No tracheal deviation present. No thyromegaly present.  No carotid bruit. No cervical adenopathy.   Cardiovascular: Normal rate, regular rhythm and normal heart sounds.   No murmur heard.  Left > right edema Pulmonary/Chest: Effort normal and breath sounds normal. No respiratory distress. No wheezes.        Assessment & Plan:    CHF, AFib Follows with cardio On eliquis, lipitor 10 mg daily Rate controlled No evidence of fluid overload  Continue current medications   See Problem List for Assessment and Plan of chronic medical problems.  Follow up in 6 months

## 2015-12-29 ENCOUNTER — Telehealth: Payer: Self-pay | Admitting: Emergency Medicine

## 2015-12-29 DIAGNOSIS — M5136 Other intervertebral disc degeneration, lumbar region: Secondary | ICD-10-CM | POA: Diagnosis not present

## 2015-12-29 NOTE — Telephone Encounter (Signed)
-----   Message from Binnie Rail, MD sent at 12/26/2015  3:01 PM EST ----- She is interested in prolia - who do we contact?

## 2016-01-02 DIAGNOSIS — M5136 Other intervertebral disc degeneration, lumbar region: Secondary | ICD-10-CM | POA: Diagnosis not present

## 2016-01-04 ENCOUNTER — Other Ambulatory Visit: Payer: Self-pay | Admitting: Internal Medicine

## 2016-01-08 ENCOUNTER — Telehealth: Payer: Self-pay | Admitting: Family

## 2016-01-08 ENCOUNTER — Other Ambulatory Visit (INDEPENDENT_AMBULATORY_CARE_PROVIDER_SITE_OTHER): Payer: PPO

## 2016-01-08 DIAGNOSIS — R7989 Other specified abnormal findings of blood chemistry: Secondary | ICD-10-CM

## 2016-01-08 DIAGNOSIS — M8580 Other specified disorders of bone density and structure, unspecified site: Secondary | ICD-10-CM

## 2016-01-08 DIAGNOSIS — I4891 Unspecified atrial fibrillation: Secondary | ICD-10-CM

## 2016-01-08 DIAGNOSIS — R946 Abnormal results of thyroid function studies: Secondary | ICD-10-CM

## 2016-01-08 DIAGNOSIS — E785 Hyperlipidemia, unspecified: Secondary | ICD-10-CM

## 2016-01-08 DIAGNOSIS — I2510 Atherosclerotic heart disease of native coronary artery without angina pectoris: Secondary | ICD-10-CM | POA: Diagnosis not present

## 2016-01-08 DIAGNOSIS — I2583 Coronary atherosclerosis due to lipid rich plaque: Secondary | ICD-10-CM

## 2016-01-08 LAB — CBC WITH DIFFERENTIAL/PLATELET
BASOS PCT: 0.5 % (ref 0.0–3.0)
Basophils Absolute: 0 10*3/uL (ref 0.0–0.1)
EOS ABS: 0.2 10*3/uL (ref 0.0–0.7)
Eosinophils Relative: 3 % (ref 0.0–5.0)
HEMATOCRIT: 41.9 % (ref 36.0–46.0)
Hemoglobin: 14.2 g/dL (ref 12.0–15.0)
LYMPHS ABS: 1.6 10*3/uL (ref 0.7–4.0)
LYMPHS PCT: 28 % (ref 12.0–46.0)
MCHC: 33.9 g/dL (ref 30.0–36.0)
MCV: 85.4 fl (ref 78.0–100.0)
MONO ABS: 0.6 10*3/uL (ref 0.1–1.0)
Monocytes Relative: 9.7 % (ref 3.0–12.0)
NEUTROS ABS: 3.4 10*3/uL (ref 1.4–7.7)
NEUTROS PCT: 58.8 % (ref 43.0–77.0)
PLATELETS: 223 10*3/uL (ref 150.0–400.0)
RBC: 4.9 Mil/uL (ref 3.87–5.11)
RDW: 14.5 % (ref 11.5–15.5)
WBC: 5.8 10*3/uL (ref 4.0–10.5)

## 2016-01-08 LAB — LIPID PANEL
CHOLESTEROL: 177 mg/dL (ref 0–200)
HDL: 94.4 mg/dL (ref >39.00)
LDL CALC: 61 mg/dL (ref 0–99)
NONHDL: 82.78
Total CHOL/HDL Ratio: 2
Triglycerides: 111 mg/dL (ref 0.0–149.0)
VLDL: 22.2 mg/dL (ref 0.0–40.0)

## 2016-01-08 LAB — HEMOGLOBIN A1C: HEMOGLOBIN A1C: 6 % (ref 4.6–6.5)

## 2016-01-08 LAB — TSH: TSH: 3.21 u[IU]/mL (ref 0.35–4.50)

## 2016-01-08 NOTE — Telephone Encounter (Signed)
Please inform patient that her blood work shows that her white/red blood cells, cholesterol and thyroid function are all within the normal limits. Her A1c is 6.0 indicating that she has pre-diabetes. No medication is needed at this time but would recommend decreasing processed sugary foods in her diet and focusing on nutrient dense foods like fruits and vegetables. Also physical activity can also help to improve this number.

## 2016-01-09 NOTE — Telephone Encounter (Signed)
Spoke with pt to inform.  

## 2016-01-10 ENCOUNTER — Encounter: Payer: Self-pay | Admitting: Internal Medicine

## 2016-01-10 DIAGNOSIS — R7303 Prediabetes: Secondary | ICD-10-CM | POA: Insufficient documentation

## 2016-01-16 NOTE — Progress Notes (Signed)
HPI: FU CAD and atrial myxoma resection. Echo 4/16 orderded by primary care for cardiomegaly; this revealed normal LV function, left atrial mass; mild MR. Cardiac cath 5/16 showed 80 LAD. Preop carotid dopplers 5/16 with no significant stenosis. Patient had CABG with LIMA to LAD, resection of atrial myxoma and LAA oversewn. Echo 7/16 showed normal LV function, grade 2 diastolic dysfunction, mild MR, mild biatrial enlargement and moderate TR. Patient did have postoperative atrial fibrillation treated with amiodarone. Patient had recurrent atrial fibrillation in September 2016. She has been treated with rate controlling medications and anticoagulation. Since last seen, She has some fatigue. She denies dyspnea on exertion, orthopnea, PND, chest pain or syncope. Mild pedal edema.  Current Outpatient Prescriptions  Medication Sig Dispense Refill  . apixaban (ELIQUIS) 5 MG TABS tablet Take 1 tablet (5 mg total) by mouth 2 (two) times daily. 56 tablet 0  . atorvastatin (LIPITOR) 10 MG tablet Take 1 tablet (10 mg total) by mouth daily at 6 PM. 30 tablet 11  . calcium citrate-vitamin D (CITRACAL+D) 315-200 MG-UNIT per tablet Take 2 tablets by mouth 2 (two) times daily.    . Cholecalciferol (VITAMIN D3) 1000 UNITS CAPS Take 1,000 Units by mouth daily.     . COMBIVENT RESPIMAT 20-100 MCG/ACT AERS respimat Inhale 1 puff into the lungs as needed.  5  . dicyclomine (BENTYL) 20 MG tablet Take 1 tablet (20 mg total) by mouth every 6 (six) hours as needed for spasms. 30 tablet 1  . docusate sodium (COLACE) 100 MG capsule Take 100 mg by mouth daily.    . fexofenadine (ALLEGRA) 180 MG tablet Take 180 mg by mouth daily. Pt takes daily    . furosemide (LASIX) 40 MG tablet Take 1 tablet (40 mg total) by mouth daily. 90 tablet 1  . Glucosamine HCl-MSM (GLUCOSAMINE-MSM PO) Take by mouth. 1500/1500 Take 3 Capsules w/ meal of choice.    Marland Kitchen latanoprost (XALATAN) 0.005 % ophthalmic solution Place 1 drop into both eyes  daily.      Marland Kitchen losartan (COZAAR) 25 MG tablet Take 1 tablet (25 mg total) by mouth daily. 90 tablet 3  . metoprolol tartrate (LOPRESSOR) 25 MG tablet TAKE 1 & 1/2 TABLETS BY MOUTH TWICE A DAY 90 tablet 0  . montelukast (SINGULAIR) 10 MG tablet TAKE 1 TABLET (10 MG TOTAL) BY MOUTH AT BEDTIME. 90 tablet 1  . Multiple Vitamin (MULITIVITAMIN WITH MINERALS) TABS Take 1 tablet by mouth daily.    Marland Kitchen omeprazole (PRILOSEC) 20 MG capsule TAKE ONE CAPSULE BY MOUTH ONE TIME DAILY 90 capsule 3  . traMADol (ULTRAM) 50 MG tablet Take 1 tablet (50 mg total) by mouth every 6 (six) hours as needed. Dr.Ramos (Patient taking differently: Take 50 mg by mouth every 6 (six) hours as needed. Dr.Ramos; pt takes 1 tablet every night, maybe 1 during the day as needed) 30 tablet 0   Current Facility-Administered Medications  Medication Dose Route Frequency Provider Last Rate Last Dose  . pneumococcal 13-valent conjugate vaccine (PREVNAR 13) injection 0.5 mL  0.5 mL Intramuscular Tomorrow-1000 Hendricks Limes, MD         Past Medical History  Diagnosis Date  . Diverticulitis 2002    based on CT scan  . Hyperlipidemia   . Hypertension   . Superficial phlebitis      X 2, 1999, 2000  . Ocular hypertension     glaucoma suspect, Dr. Kathrin Penner  . Skin cancer   . Endometriosis   .  Gastritis 2006    @ Endo  . Hiatal hernia   . Diverticulosis     Dr Henrene Pastor  . GERD (gastroesophageal reflux disease)   . DVT (deep venous thrombosis) ([place]) 2000    no trigger  . Syncope 11/12/11    in context CAP . Carotid Doppler exam was negative. 2D echocardiogram revealed mild dilation of the left atrium and moderate increase in the systolic pressureof  the pulmonary artery  . Atrial fibrillation ([place])     Post op after repair of ankle fracture  . UTI (lower urinary tract infection) 01/2013    Strep bovis ; PCN sensitive  . IBS (irritable bowel syndrome)   . CAD (coronary artery disease) 03/2015    LAD 80%, otherwise minimal CAD.  s/p LIMA-LAD  . 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 Total abdominal hysterectomy w/ bilateral salpingoophorectomy  1980    dysfunctional menses, endometriosis  . Varicose vein surgery      1960, 1965, 2009  . Tonsillectomy    . Cataract extraction w/ intraocular lens implant      OS; Dr Kathrin Penner  . Colonoscopy  1995, 2002, 2012    diverticulosis; Dr Henrene Pastor  . Orif ankle fracture  11/14/2011    Procedure: OPEN REDUCTION INTERNAL FIXATION (ORIF) ANKLE FRACTURE;  Surgeon: Colin Rhein;  Location: [facility];  Service: Orthopedics;  Laterality: Left;  open reduction internal fixation trimalleolar ankle fracture  . Cardiac catheterization N/A 03/28/2015    Procedure: Left Heart Cath and Coronary Angiography;  Surgeon: Jolaine Artist, MD;  Location: [facility] INVASIVE CV LAB CUPID;  Service: Cardiovascular;  Laterality: N/A;  . Coronary artery bypass graft N/A 04/01/2015    Procedure: CORONARY ARTERY BYPASS GRAFTING (CABG), ON PUMP, TIMES ONE, USING LEFT INTERNAL MAMMARY ARTERY;  Surgeon: Ivin Poot, MD;  Location: [place];  Service: Open Heart Surgery;  Laterality: N/A;  . Excision of atrial myxoma N/A 04/01/2015    Procedure: EXCISION OF ATRIAL MYXOMA;  Surgeon: Ivin Poot, MD;  Location: [place];  Service: Open Heart Surgery;  Laterality: N/A;  . Tee without cardioversion N/A 04/01/2015    Procedure: TRANSESOPHAGEAL ECHOCARDIOGRAM (TEE);  Surgeon: Ivin Poot, MD;  Location: [place];  Service: Open Heart Surgery;  Laterality: N/A;  . Cataract extraction Bilateral     Social History   Social History  . Marital Status: Married    Spouse Name: N/A  . Number of Children: 2  . Years of Education: N/A   Occupational History  . Retired    Social History Main Topics  . Smoking status: Never Smoker   . Smokeless tobacco: Never Used  . Alcohol Use: No  . Drug Use: No  . Sexual Activity: Not on  file   Other Topics Concern  . Not on file   Social History Narrative   REG EXERCISE   HAD COLONOSCOPY AND ENDOSCOPY DONE DATES UNKNOWN    Family History  Problem Relation Age of Onset  . Heart attack Father 22  . Alzheimer's disease Mother     TIAs  . Hyperlipidemia Brother   . Hypertension Brother   . Diverticulitis Daughter     S/P colectomy  . Colon cancer Maternal Grandmother   . Heart attack Paternal Grandmother 82  . Diabetes Neg Hx   . Stroke Neg Hx     ROS: Fatigue but  no fevers or chills, productive cough, hemoptysis, dysphasia, odynophagia, melena, hematochezia, dysuria, hematuria, rash, seizure activity, orthopnea, PND, claudication. Remaining systems are negative.  Physical Exam: Well-developed well-nourished in no acute distress.  Skin is warm and dry.  HEENT is normal.  Neck is supple.  Chest is clear to auscultation with normal expansion.  Cardiovascular exam is regular rate and rhythm. 2/6 systolic murmur left sternal border. Abdominal exam nontender or distended. No masses palpated. Extremities show trace edema. neuro grossly intact  ECG Sinus rhythm at a rate of 60. Occasional PACs and PVCs. Right bundle branch block.

## 2016-01-19 ENCOUNTER — Ambulatory Visit (INDEPENDENT_AMBULATORY_CARE_PROVIDER_SITE_OTHER): Payer: PPO | Admitting: Cardiology

## 2016-01-19 ENCOUNTER — Encounter: Payer: Self-pay | Admitting: Cardiology

## 2016-01-19 VITALS — BP 132/92 | HR 60 | Ht 65.0 in | Wt 181.0 lb

## 2016-01-19 DIAGNOSIS — I10 Essential (primary) hypertension: Secondary | ICD-10-CM | POA: Diagnosis not present

## 2016-01-19 DIAGNOSIS — I25810 Atherosclerosis of coronary artery bypass graft(s) without angina pectoris: Secondary | ICD-10-CM | POA: Diagnosis not present

## 2016-01-19 DIAGNOSIS — E785 Hyperlipidemia, unspecified: Secondary | ICD-10-CM

## 2016-01-19 DIAGNOSIS — I480 Paroxysmal atrial fibrillation: Secondary | ICD-10-CM | POA: Diagnosis not present

## 2016-01-19 DIAGNOSIS — D151 Benign neoplasm of heart: Secondary | ICD-10-CM

## 2016-01-19 NOTE — Assessment & Plan Note (Signed)
Continue statin. No aspirin given me for anticoagulation. Check liver functions.

## 2016-01-19 NOTE — Assessment & Plan Note (Signed)
Status post resection.Plan repeat echocardiogram when she returns in 6 months.

## 2016-01-19 NOTE — Patient Instructions (Signed)
Medication Instructions:   NO CHANGE  Labwork:  Your physician recommends that you return for lab work WHEN ABLE  Follow-Up:  Your physician wants you to follow-up in: [place] will receive a reminder letter in the mail two months in advance. If you don't receive a letter, please call our office to schedule the follow-up appointment.   If you need a refill on your cardiac medications before your next appointment, please call your pharmacy.

## 2016-01-19 NOTE — Assessment & Plan Note (Signed)
Blood pressure is mildly elevated. I have asked her to track this at home and we will increase Cozaar as needed.

## 2016-01-19 NOTE — Assessment & Plan Note (Signed)
Patient in sinus rhythm today. Continue metoprolol and apixaban. Recent hemoglobin normal.Check renal function.

## 2016-01-19 NOTE — Assessment & Plan Note (Signed)
Continue statin. 

## 2016-01-27 DIAGNOSIS — I25810 Atherosclerosis of coronary artery bypass graft(s) without angina pectoris: Secondary | ICD-10-CM | POA: Diagnosis not present

## 2016-01-28 LAB — BASIC METABOLIC PANEL
BUN: 20 mg/dL (ref 7–25)
CHLORIDE: 103 mmol/L (ref 98–110)
CO2: 32 mmol/L — ABNORMAL HIGH (ref 20–31)
Calcium: 9.6 mg/dL (ref 8.6–10.4)
Creat: 0.94 mg/dL — ABNORMAL HIGH (ref 0.60–0.93)
Glucose, Bld: 98 mg/dL (ref 65–99)
POTASSIUM: 3.9 mmol/L (ref 3.5–5.3)
SODIUM: 141 mmol/L (ref 135–146)

## 2016-01-28 LAB — HEPATIC FUNCTION PANEL
ALK PHOS: 69 U/L (ref 33–130)
ALT: 27 U/L (ref 6–29)
AST: 28 U/L (ref 10–35)
Albumin: 4.1 g/dL (ref 3.6–5.1)
BILIRUBIN DIRECT: 0.1 mg/dL (ref <=0.2)
BILIRUBIN INDIRECT: 0.4 mg/dL (ref 0.2–1.2)
Total Bilirubin: 0.5 mg/dL (ref 0.2–1.2)
Total Protein: 6.5 g/dL (ref 6.1–8.1)

## 2016-02-25 DIAGNOSIS — Z961 Presence of intraocular lens: Secondary | ICD-10-CM | POA: Diagnosis not present

## 2016-03-08 ENCOUNTER — Other Ambulatory Visit: Payer: Self-pay

## 2016-03-08 MED ORDER — COMBIVENT RESPIMAT 20-100 MCG/ACT IN AERS: 1.0000 | INHALATION_SPRAY | RESPIRATORY_TRACT | Status: DC | PRN

## 2016-03-15 IMAGING — CR DG CHEST 2V
2 series · 2 of 2 positions shown · non-contrast
Comparison: 02/13/2015

CLINICAL DATA: Coronary artery disease

EXAM:
CHEST  2 VIEW

[w chest pa]
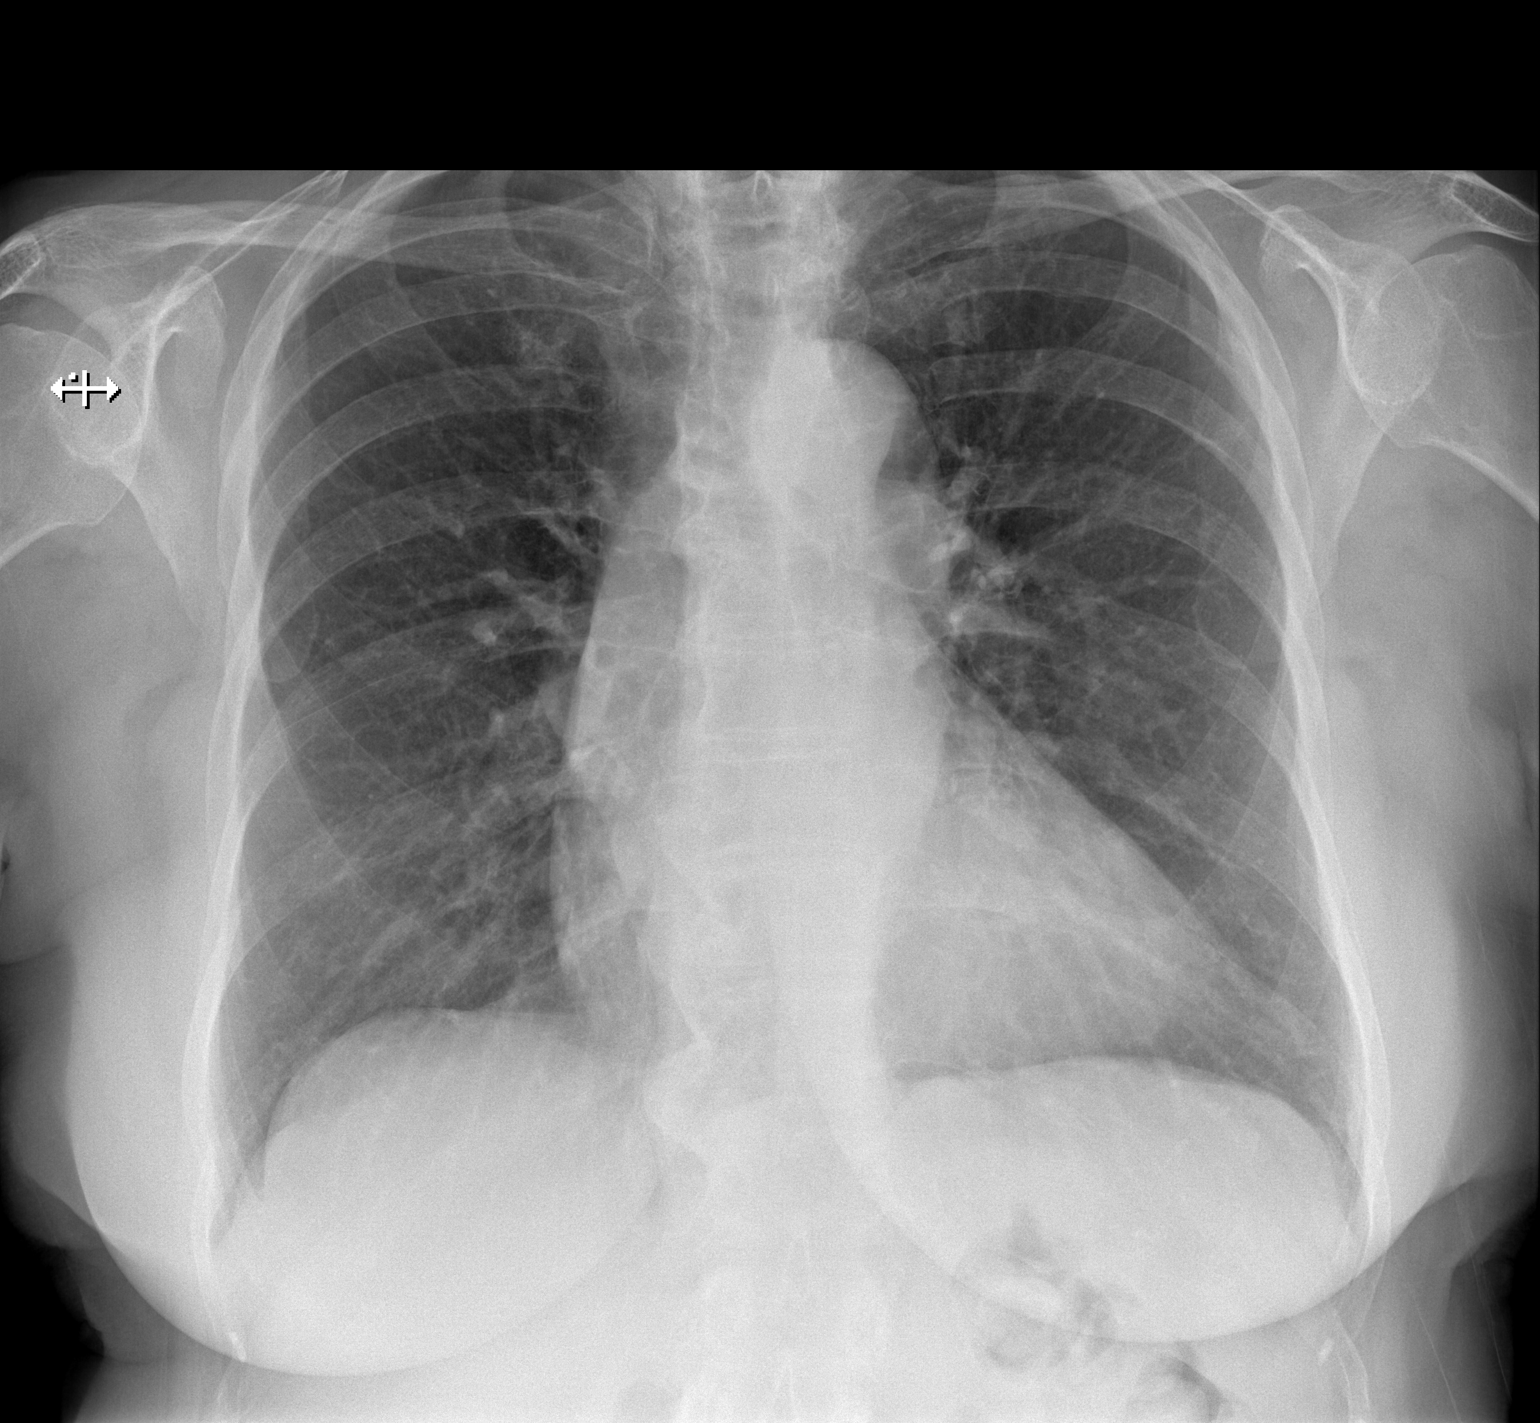

[w chest lat]
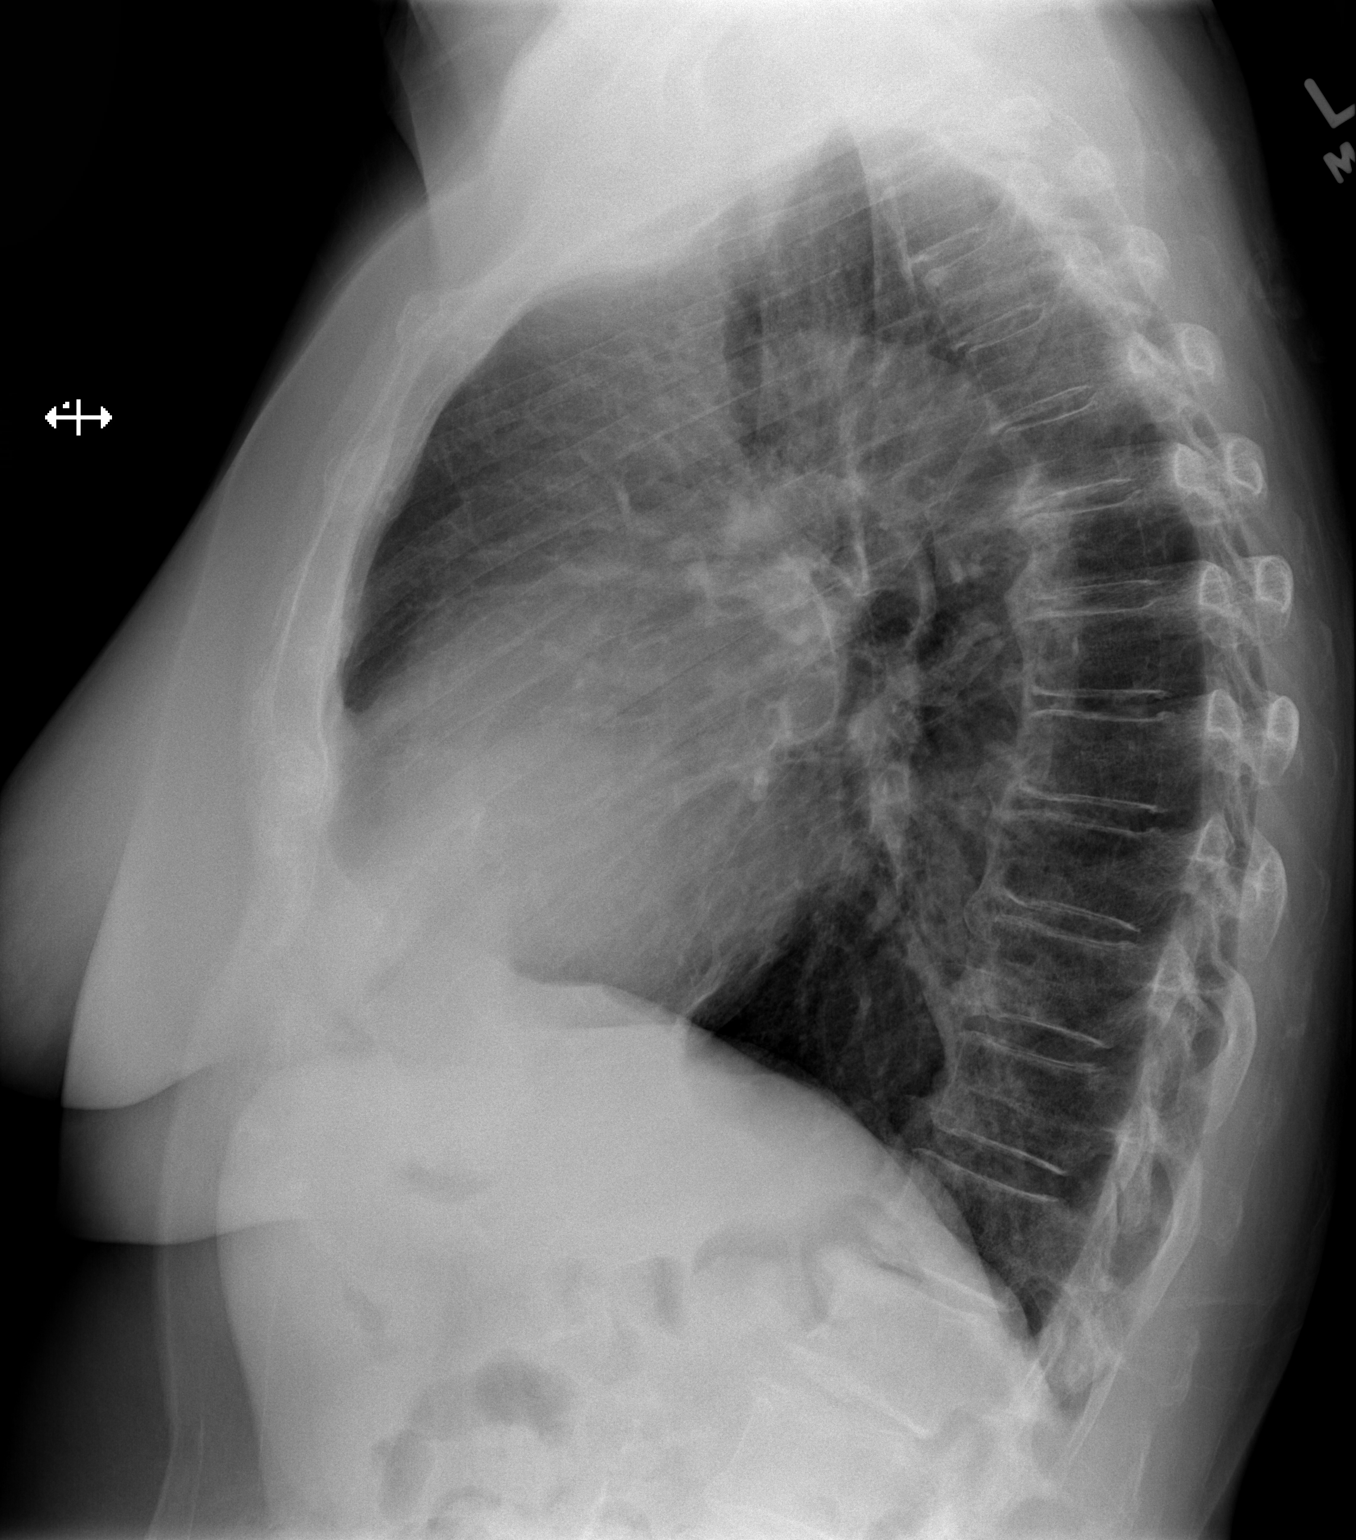

[2 of 2 positions shown; findings below may reference images not displayed]

FINDINGS: The heart size and mediastinal contours are within normal limits.
Both lungs are clear. The visualized skeletal structures are
unremarkable.
IMPRESSION: No active cardiopulmonary disease.

## 2016-03-18 IMAGING — CR DG CHEST 1V PORT
1 series · 1 of 1 positions shown · non-contrast
Comparison: 04/02/2015.

CLINICAL DATA: CABG.

EXAM:
PORTABLE CHEST - 1 VIEW

[AP]
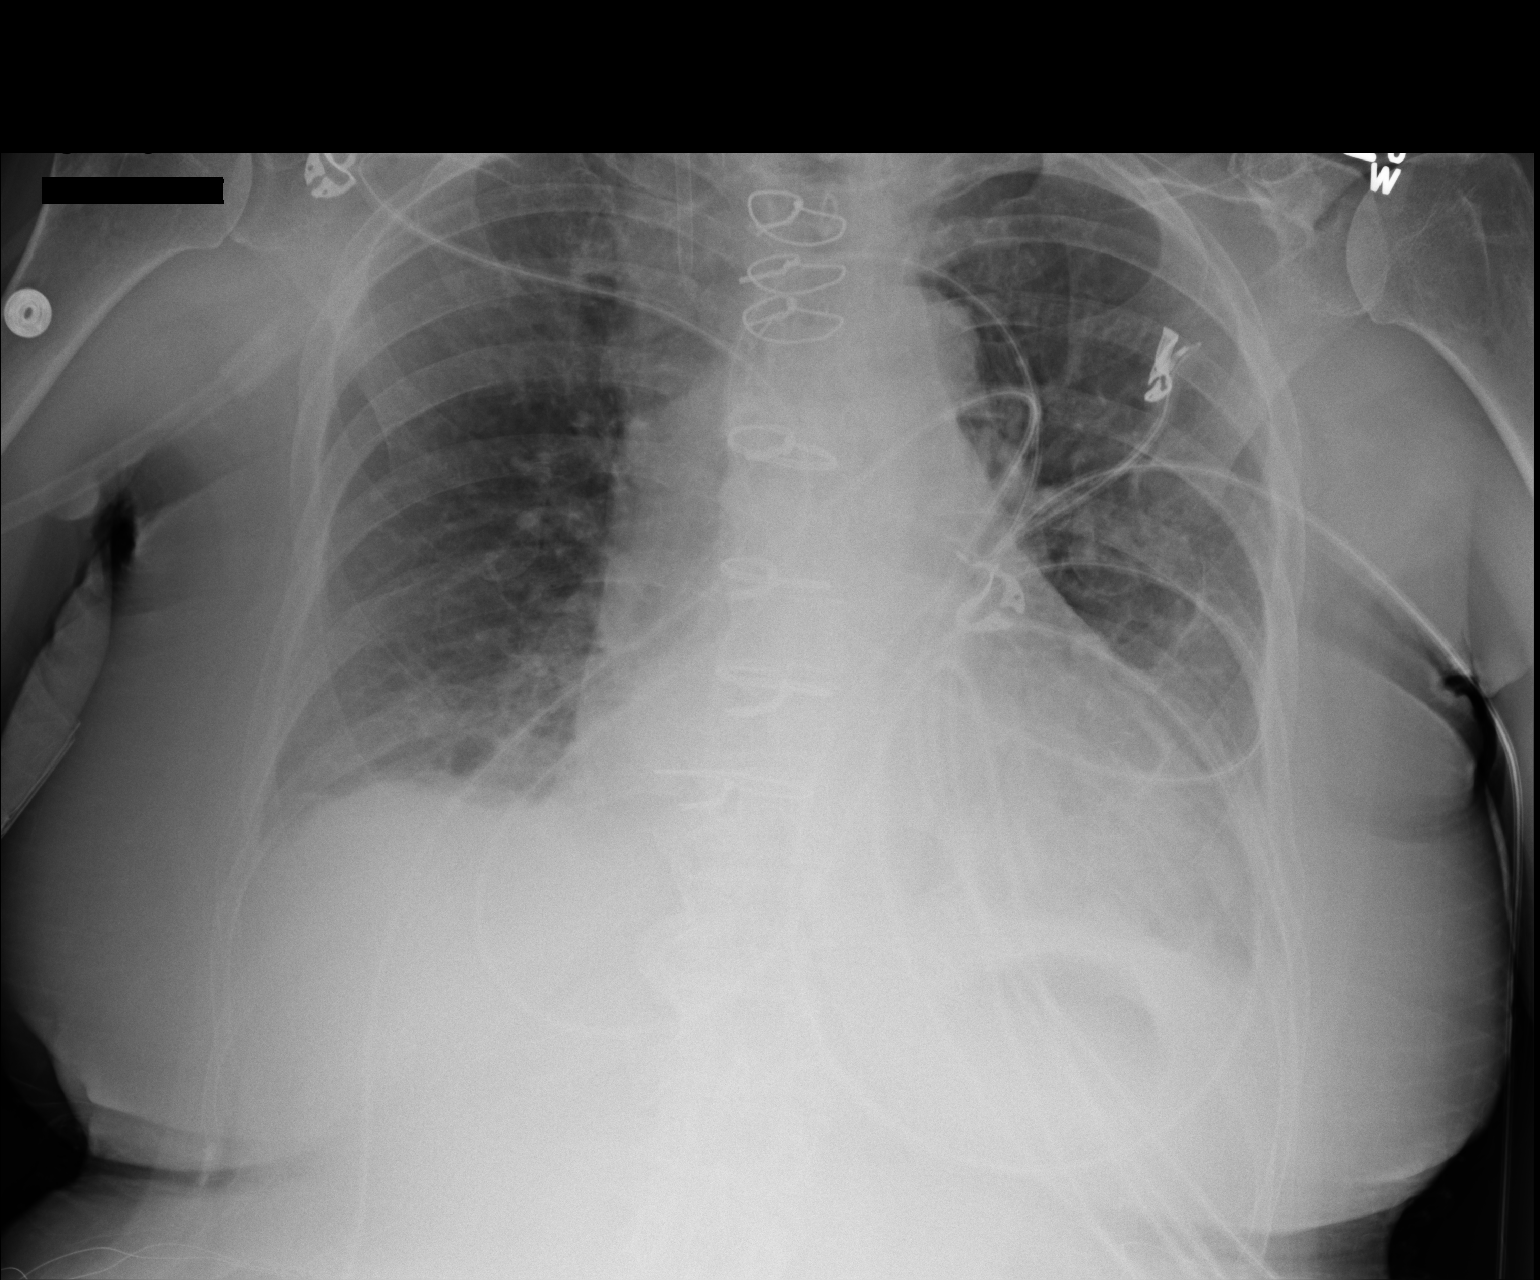

[1 of 1 positions shown; findings below may reference images not displayed]

FINDINGS: Interim removal of Swan-Ganz catheter, mediastinal drainage
catheter, and left chest tube. Cardiomegaly with normal pulmonary
vascularity. Low lung volumes with mild basilar atelectasis and/or
infiltrates. Mild infiltrate right upper lobe cannot be excluded. No
pleural effusion or pneumothorax .
IMPRESSION: 1. Interim removal of Swan-Ganz catheter, mediastinal drainage
catheter, left chest tube. Right IJ sheath in good anatomic
position. No pneumothorax.

2. Low lung volumes with bibasilar atelectasis and/or infiltrates. A
mild infiltrate in the right upper lobe cannot be excluded on
today's exam. Attention to this region on subsequent chest x-rays
suggested.

3. Prior CABG. Stable cardiomegaly. No pulmonary venous congestion.

## 2016-03-19 IMAGING — DX DG CHEST 2V
2 series · 2 of 2 positions shown · non-contrast
Comparison: 04/03/2015 .

CLINICAL DATA: CABG.

EXAM:
CHEST  2 VIEW

[chest pa]
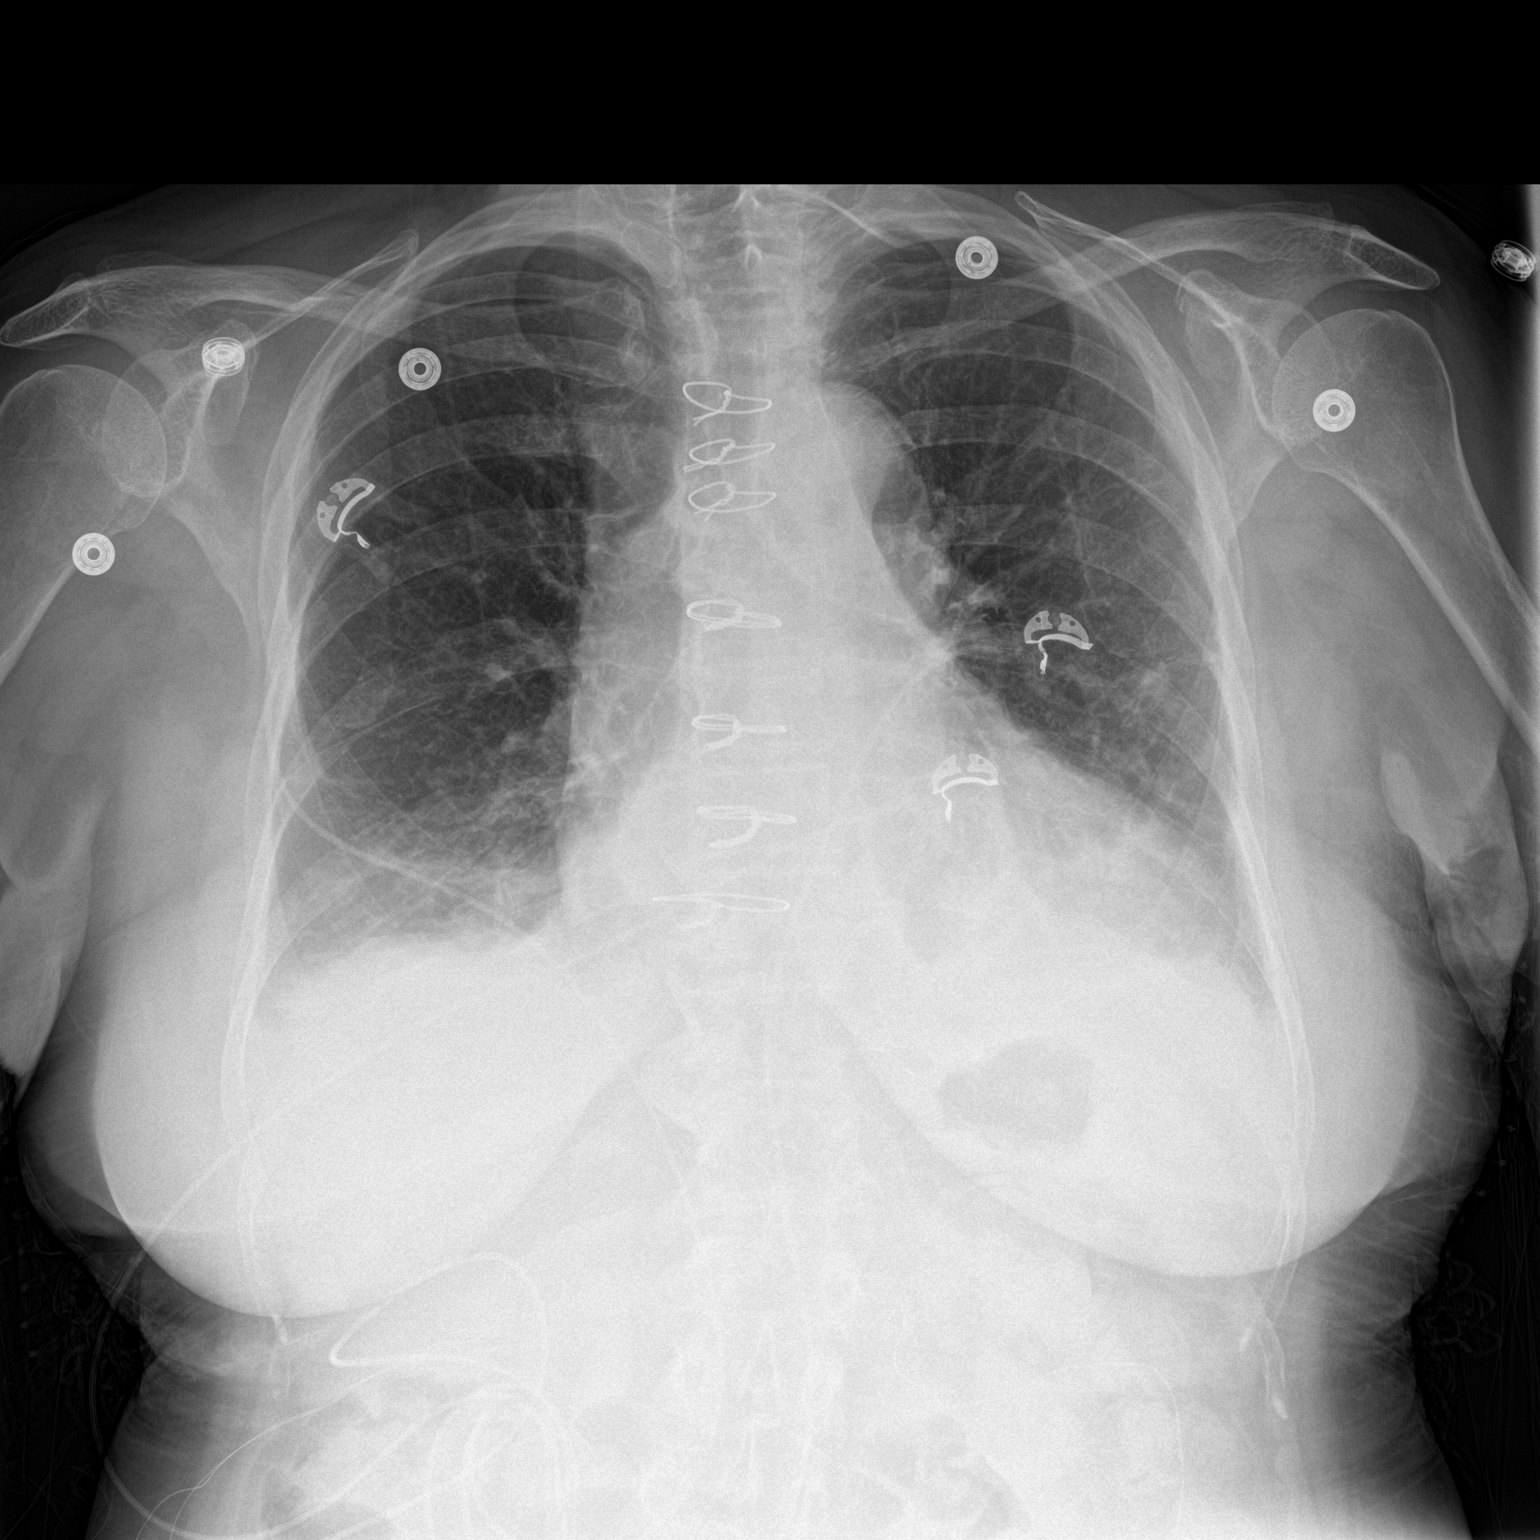

[chest lat]
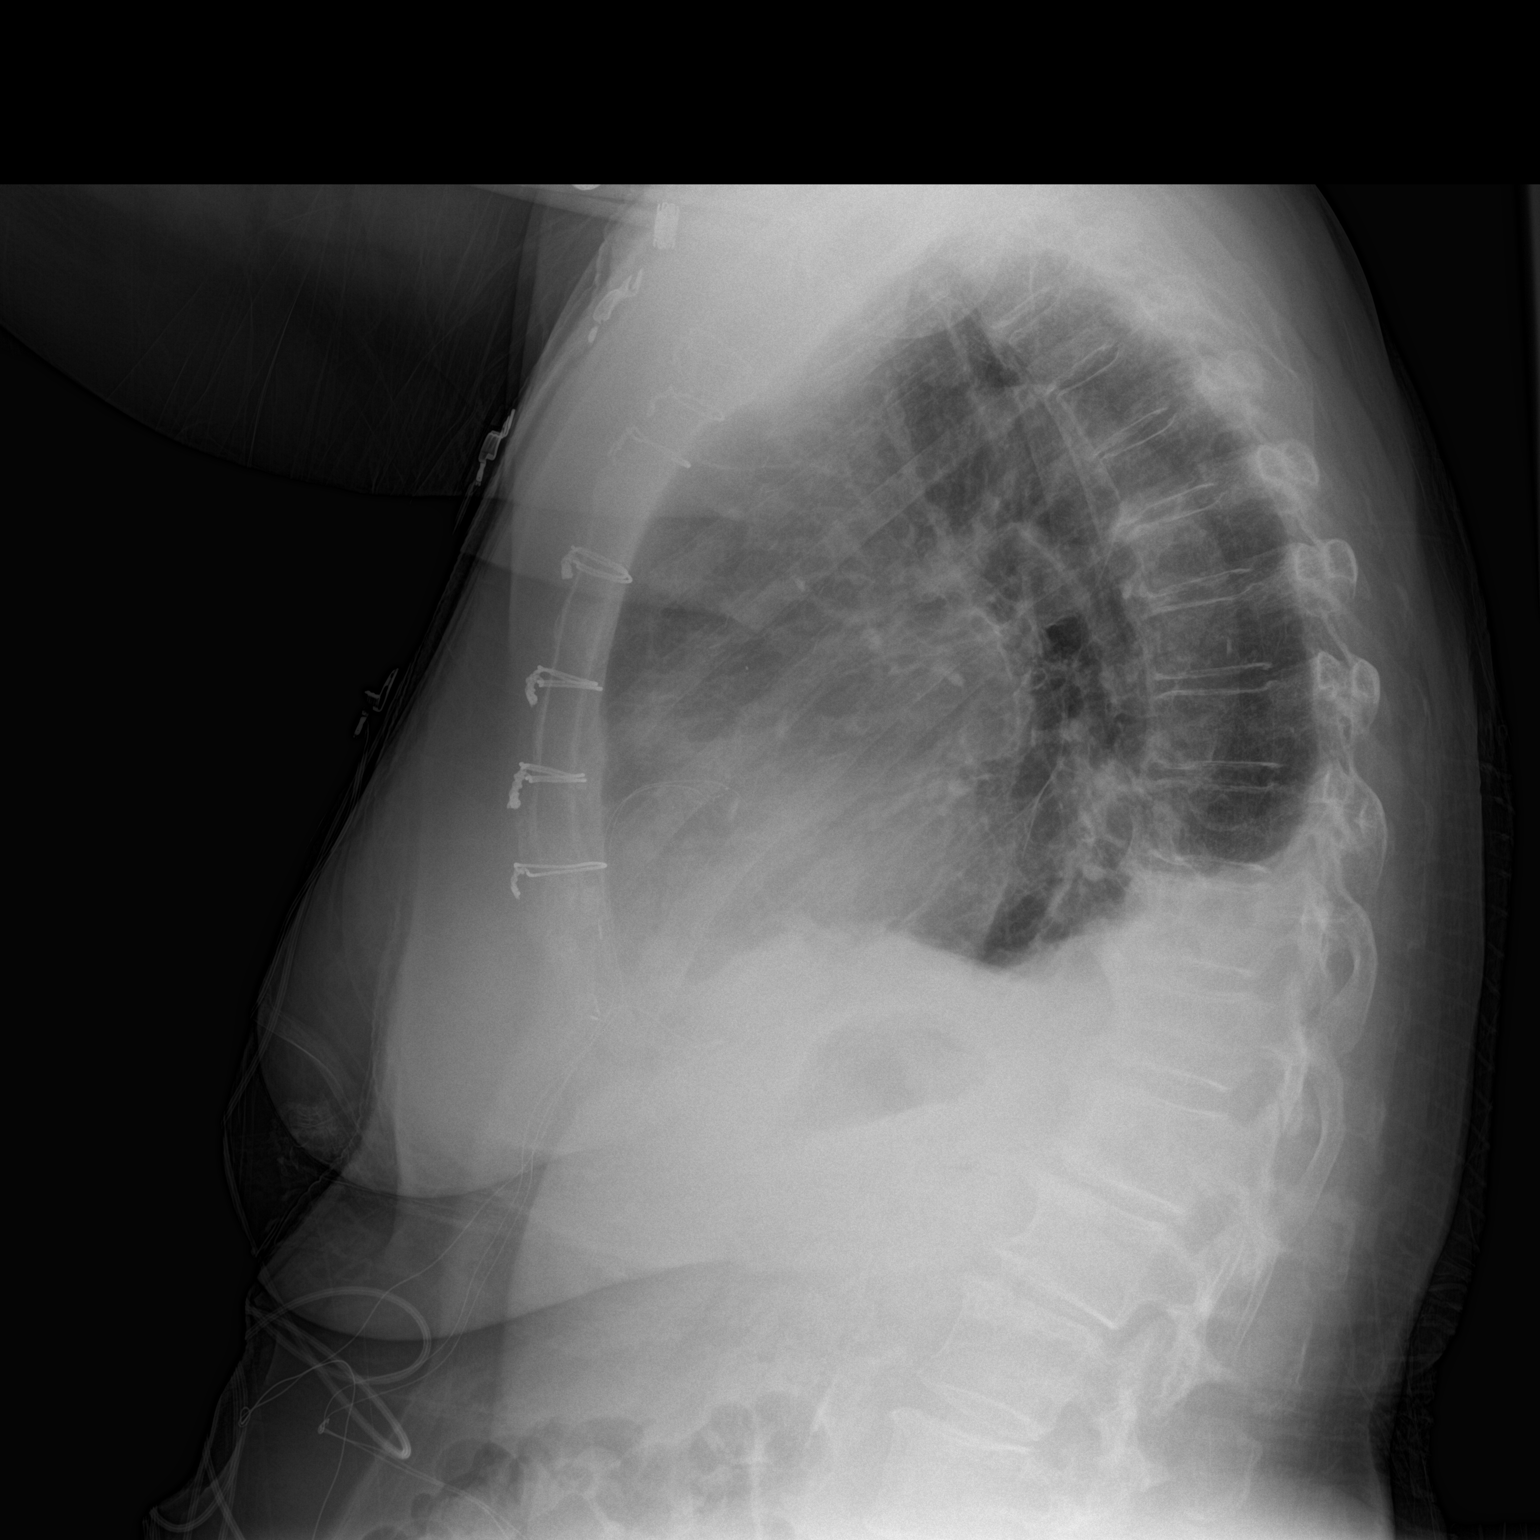

[2 of 2 positions shown; findings below may reference images not displayed]

FINDINGS: Mediastinum hilar structures normal. Prior median sternotomy. Stable
cardiomegaly. Normal pulmonary vascularity. Low lung volumes with
mild bibasilar atelectasis and/or infiltrates. Small pleural
effusions cannot be excluded. No significant interim change. No
pneumothorax. No acute osseous abnormality.
IMPRESSION: 1. Interim removal of right IJ sheath.
2. Low lung volumes with stable bibasilar atelectasis and/or
infiltrates. Small pleural effusions again noted.
3. No evidence of infiltrate noted in the right upper lobe on
today's exam.
4. Prior CABG. Cardiomegaly is stable. No pulmonary venous
congestion .

## 2016-03-25 ENCOUNTER — Other Ambulatory Visit: Payer: Self-pay | Admitting: Cardiology

## 2016-03-25 NOTE — Telephone Encounter (Signed)
Rx refill sent to pharmacy. 

## 2016-03-27 ENCOUNTER — Other Ambulatory Visit: Payer: Self-pay | Admitting: Cardiology

## 2016-03-29 NOTE — Telephone Encounter (Signed)
Rx request sent to pharmacy.  

## 2016-04-05 ENCOUNTER — Other Ambulatory Visit: Payer: Self-pay | Admitting: Emergency Medicine

## 2016-04-05 MED ORDER — OMEPRAZOLE 20 MG PO CPDR: DELAYED_RELEASE_CAPSULE | ORAL | Status: DC

## 2016-04-08 ENCOUNTER — Other Ambulatory Visit (HOSPITAL_COMMUNITY): Payer: PPO

## 2016-04-14 DIAGNOSIS — H04223 Epiphora due to insufficient drainage, bilateral lacrimal glands: Secondary | ICD-10-CM | POA: Diagnosis not present

## 2016-05-03 DIAGNOSIS — H40011 Open angle with borderline findings, low risk, right eye: Secondary | ICD-10-CM | POA: Diagnosis not present

## 2016-05-03 DIAGNOSIS — Z961 Presence of intraocular lens: Secondary | ICD-10-CM | POA: Diagnosis not present

## 2016-05-03 DIAGNOSIS — H26491 Other secondary cataract, right eye: Secondary | ICD-10-CM | POA: Diagnosis not present

## 2016-05-03 DIAGNOSIS — H401122 Primary open-angle glaucoma, left eye, moderate stage: Secondary | ICD-10-CM | POA: Diagnosis not present

## 2016-05-18 DIAGNOSIS — Z79891 Long term (current) use of opiate analgesic: Secondary | ICD-10-CM | POA: Diagnosis not present

## 2016-05-18 DIAGNOSIS — M4806 Spinal stenosis, lumbar region: Secondary | ICD-10-CM | POA: Diagnosis not present

## 2016-05-18 DIAGNOSIS — M5136 Other intervertebral disc degeneration, lumbar region: Secondary | ICD-10-CM | POA: Diagnosis not present

## 2016-05-19 DIAGNOSIS — H1031 Unspecified acute conjunctivitis, right eye: Secondary | ICD-10-CM | POA: Diagnosis not present

## 2016-05-19 DIAGNOSIS — H04301 Unspecified dacryocystitis of right lacrimal passage: Secondary | ICD-10-CM | POA: Diagnosis not present

## 2016-06-03 ENCOUNTER — Other Ambulatory Visit: Payer: Self-pay

## 2016-06-03 ENCOUNTER — Other Ambulatory Visit: Payer: Self-pay | Admitting: Internal Medicine

## 2016-06-03 MED ORDER — ATORVASTATIN CALCIUM 10 MG PO TABS: 10.0000 mg | ORAL_TABLET | Freq: Every day | ORAL | Status: DC

## 2016-06-04 DIAGNOSIS — H26491 Other secondary cataract, right eye: Secondary | ICD-10-CM | POA: Diagnosis not present

## 2016-06-04 DIAGNOSIS — H1031 Unspecified acute conjunctivitis, right eye: Secondary | ICD-10-CM | POA: Diagnosis not present

## 2016-06-04 DIAGNOSIS — H401122 Primary open-angle glaucoma, left eye, moderate stage: Secondary | ICD-10-CM | POA: Diagnosis not present

## 2016-06-04 DIAGNOSIS — H40051 Ocular hypertension, right eye: Secondary | ICD-10-CM | POA: Diagnosis not present

## 2016-06-11 DIAGNOSIS — Z1231 Encounter for screening mammogram for malignant neoplasm of breast: Secondary | ICD-10-CM | POA: Diagnosis not present

## 2016-06-11 LAB — HM MAMMOGRAPHY

## 2016-06-21 ENCOUNTER — Other Ambulatory Visit: Payer: Self-pay | Admitting: Cardiology

## 2016-06-21 NOTE — Telephone Encounter (Signed)
REFILL 

## 2016-06-23 ENCOUNTER — Ambulatory Visit (INDEPENDENT_AMBULATORY_CARE_PROVIDER_SITE_OTHER): Payer: PPO | Admitting: Internal Medicine

## 2016-06-23 ENCOUNTER — Other Ambulatory Visit (INDEPENDENT_AMBULATORY_CARE_PROVIDER_SITE_OTHER): Payer: PPO

## 2016-06-23 ENCOUNTER — Encounter: Payer: Self-pay | Admitting: Internal Medicine

## 2016-06-23 VITALS — BP 152/94 | HR 65 | Temp 98.0°F | Resp 16 | Ht 65.0 in | Wt 179.0 lb

## 2016-06-23 DIAGNOSIS — I2510 Atherosclerotic heart disease of native coronary artery without angina pectoris: Secondary | ICD-10-CM | POA: Diagnosis not present

## 2016-06-23 DIAGNOSIS — I10 Essential (primary) hypertension: Secondary | ICD-10-CM | POA: Diagnosis not present

## 2016-06-23 DIAGNOSIS — E785 Hyperlipidemia, unspecified: Secondary | ICD-10-CM | POA: Diagnosis not present

## 2016-06-23 DIAGNOSIS — R7303 Prediabetes: Secondary | ICD-10-CM | POA: Diagnosis not present

## 2016-06-23 DIAGNOSIS — M8580 Other specified disorders of bone density and structure, unspecified site: Secondary | ICD-10-CM

## 2016-06-23 DIAGNOSIS — I2583 Coronary atherosclerosis due to lipid rich plaque: Secondary | ICD-10-CM

## 2016-06-23 DIAGNOSIS — K219 Gastro-esophageal reflux disease without esophagitis: Secondary | ICD-10-CM

## 2016-06-23 LAB — HEMOGLOBIN A1C: HEMOGLOBIN A1C: 5.8 % (ref 4.6–6.5)

## 2016-06-23 LAB — COMPREHENSIVE METABOLIC PANEL
ALBUMIN: 4.6 g/dL (ref 3.5–5.2)
ALT: 30 U/L (ref 0–35)
AST: 33 U/L (ref 0–37)
Alkaline Phosphatase: 80 U/L (ref 39–117)
BILIRUBIN TOTAL: 0.8 mg/dL (ref 0.2–1.2)
BUN: 25 mg/dL — ABNORMAL HIGH (ref 6–23)
CALCIUM: 10.6 mg/dL — AB (ref 8.4–10.5)
CO2: 28 meq/L (ref 19–32)
CREATININE: 0.99 mg/dL (ref 0.40–1.20)
Chloride: 102 mEq/L (ref 96–112)
GFR: 57.68 mL/min — ABNORMAL LOW (ref >60.00)
Glucose, Bld: 88 mg/dL (ref 70–99)
Potassium: 3.7 mEq/L (ref 3.5–5.1)
Sodium: 140 mEq/L (ref 135–145)
Total Protein: 7.6 g/dL (ref 6.0–8.3)

## 2016-06-23 MED ORDER — RANITIDINE HCL 150 MG PO TABS: 150.0000 mg | ORAL_TABLET | Freq: Every day | ORAL | Status: DC

## 2016-06-23 NOTE — Assessment & Plan Note (Signed)
No symptoms  Has follow up scheduled with cardiology

## 2016-06-23 NOTE — Progress Notes (Signed)
Subjective:    Patient ID: Valerie Rollins, female    DOB: 1936/09/15, 80 y.o.   MRN: [id number]  HPI She is here for follow up.  Prediabetes:  She is compliant with a low sugar/carbohydrate diet.  She is exercising regularly - walking.  Hyperlipidemia: She is taking her medication daily. She is compliant with a low fat/cholesterol diet. She is exercising regularly. She denies myalgias.   Hypertension: She is taking her medication daily. She is compliant with a low sodium diet.  She denies chest pain, palpitations, edema, shortness of breath and regular headaches. She is exercising regularly.  She does not monitor her blood pressure at home.    CAD, Afib, Hypertension: She is taking her medication daily. She is compliant with a low sodium diet.  She denies chest pain, palpitations, shortness of breath and regular headaches. She is exercising regularly.  She does not monitor her blood pressure at home.    Leg edema:  She takes lasix daily, occasionally an extra pill if needed.   GERD:  She is taking her medication daily as prescribed.  She denies any GERD symptoms and feels her GERD is well controlled.   Osteopenia:  She is walking.  She is taking the calcium and vitamin d daily.  Her dexa was done in 10/2015 and she has osteopenia but has a high FRAX hip score.  She did not tolerate fosamax in the past.  She has had a previous fracture.   Scoliosis, chronic back pain:  She follows with Dr Nelva Bush and he prescribed tramadol.   Medications and allergies reviewed with patient and updated if appropriate.  Patient Active Problem List   Diagnosis Date Noted  . Prediabetes 01/10/2016  . Osteopenia 12/26/2015  . Mass of neck 07/07/2015  . Encounter for therapeutic drug monitoring 05/14/2015  . Long-term (current) use of anticoagulants 04/16/2015  . S/P CABG x 1 04/01/2015  . CAD (coronary artery disease) 03/28/2015  . 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 Abnormal thyroid blood test 03/02/2015  .  Cardiomegaly 02/20/2015  . Hypotension, postural 06/07/2013  . DDD (degenerative disc disease), lumbar 07/20/2012  . Atrial fibrillation ([place]) 11/15/2011  . Syncope 11/12/2011  . Hypokalemia 11/12/2011  . RBBB (right bundle branch block) 11/12/2011  . HTN (hypertension), malignant 11/12/2011  . Ankle fracture, left 11/12/2011  . HOARSENESS 02/23/2010  . SKIN CANCER, HX OF 11/18/2009  . Diverticulosis of large intestine 10/31/2008  . HEART MURMUR, SYSTOLIC [id number]  . VARICOSE VEINS, LOWER EXTREMITIES 03/13/2008  . GERD 02/20/2008  . IBS 02/20/2008  . OSTEOARTHRITIS 02/20/2008  . Hyperlipidemia 12/01/2006  . Essential hypertension 12/01/2006  . SUPERFICIAL THROMBOPHLEBITIS 12/01/2006  . DVT, HX OF 12/01/2006  . Personal history of other diseases of digestive system 12/01/2006    Current Outpatient Prescriptions on File Prior to Visit  Medication Sig Dispense Refill  . atorvastatin (LIPITOR) 10 MG tablet Take 1 tablet (10 mg total) by mouth daily at 6 PM. 30 tablet 11  . calcium citrate-vitamin D (CITRACAL+D) 315-200 MG-UNIT per tablet Take 2 tablets by mouth 2 (two) times daily.    . Cholecalciferol (VITAMIN D3) 1000 UNITS CAPS Take 1,000 Units by mouth daily.     . COMBIVENT RESPIMAT 20-100 MCG/ACT AERS respimat Inhale 1 puff into the lungs as needed. 4 g 5  . dicyclomine (BENTYL) 20 MG tablet TAKE 1 TABLET (20 MG TOTAL) BY MOUTH EVERY 6 (SIX) HOURS AS NEEDED FOR SPASMS. 30 tablet 1  . docusate sodium (COLACE)  100 MG capsule Take 100 mg by mouth daily.    Marland Kitchen ELIQUIS 5 MG TABS tablet TAKE 1 TABLET (5 MG TOTAL) BY MOUTH 2 (TWO) TIMES DAILY. 60 tablet 3  . fexofenadine (ALLEGRA) 180 MG tablet Take 180 mg by mouth daily. Pt takes daily    . furosemide (LASIX) 40 MG tablet Take 1 tablet (40 mg total) by mouth daily. 90 tablet 1  . Glucosamine HCl-MSM (GLUCOSAMINE-MSM PO) Take by mouth. 1500/1500 Take 3 Capsules w/ meal of choice.    Marland Kitchen latanoprost (XALATAN) 0.005 % ophthalmic solution  Place 1 drop into both eyes daily.      Marland Kitchen losartan (COZAAR) 25 MG tablet Take 1 tablet (25 mg total) by mouth daily. 90 tablet 3  . metoprolol tartrate (LOPRESSOR) 25 MG tablet Take 1.5 tablets (37.5 mg total) by mouth 2 (two) times daily. 90 tablet 1  . montelukast (SINGULAIR) 10 MG tablet TAKE 1 TABLET (10 MG TOTAL) BY MOUTH AT BEDTIME. 90 tablet 1  . Multiple Vitamin (MULITIVITAMIN WITH MINERALS) TABS Take 1 tablet by mouth daily.    . traMADol (ULTRAM) 50 MG tablet Take 1 tablet (50 mg total) by mouth every 6 (six) hours as needed. Dr.Ramos (Patient taking differently: Take 50 mg by mouth every 6 (six) hours as needed. Dr.Ramos; pt takes 1 tablet every night, maybe 1 during the day as needed) 30 tablet 0   Current Facility-Administered Medications on File Prior to Visit  Medication Dose Route Frequency Provider Last Rate Last Dose  . pneumococcal 13-valent conjugate vaccine (PREVNAR 13) injection 0.5 mL  0.5 mL Intramuscular Tomorrow-1000 Hendricks Limes, MD        Past Medical History:  Diagnosis Date  . Atrial fibrillation ([place])    Post op after repair of ankle fracture  . 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 CAD (coronary artery disease) 03/2015   LAD 80%, otherwise minimal CAD. s/p LIMA-LAD  . Diverticulitis 2002   based on CT scan  . Diverticulosis    Dr Henrene Pastor  . DVT (deep venous thrombosis) ([place]) 2000   no trigger  . Endometriosis   . Gastritis 2006   @ Endo  . GERD (gastroesophageal reflux disease)   . Hiatal hernia   . Hyperlipidemia   . Hypertension   . IBS (irritable bowel syndrome)   . Ocular hypertension    glaucoma suspect, Dr. Kathrin Penner  . Skin cancer   . Superficial phlebitis     X 2, 1999, 2000  . Syncope 11/12/11   in context CAP . Carotid Doppler exam was negative. 2D echocardiogram revealed mild dilation of the left atrium and moderate increase in the systolic pressureof  the pulmonary artery  . UTI (lower urinary  tract infection) 01/2013   Strep bovis ; PCN sensitive    Past Surgical History:  Procedure Laterality Date  . CARDIAC CATHETERIZATION N/A 03/28/2015   Procedure: Left Heart Cath and Coronary Angiography;  Surgeon: Jolaine Artist, MD;  Location: [place] INVASIVE CV LAB [facility]UP[facility];  Service: Cardiovascular;  Laterality: N/A;  . CATARACT EXTRACTION Bilateral   . CATARACT EXTRACTION W/ INTRAOCULAR LENS IMPLANT     OS; Dr Kathrin Penner  . COLONOSCOPY  1995, 2002, 2012   diverticulosis; Dr Henrene Pastor  . CORONARY ARTERY BYPASS GRAFT N/A 04/01/2015   Procedure: CORONARY ARTERY BYPASS GRAFTING (CABG), ON PUMP, TIMES ONE, USING LEFT INTERNAL MAMMARY ARTERY;  Surgeon: Ivin Poot, MD;  Location: [place];  Service: Open Heart Surgery;  Laterality: N/A;  . EXCISION OF ATRIAL MYXOMA N/A 04/01/2015   Procedure: EXCISION OF ATRIAL MYXOMA;  Surgeon: Ivin Poot, MD;  Location: [place];  Service: Open Heart Surgery;  Laterality: N/A;  . ORIF ANKLE FRACTURE  11/14/2011   Procedure: OPEN REDUCTION INTERNAL FIXATION (ORIF) ANKLE FRACTURE;  Surgeon: Valerie Rhein;  Location: [facility];  Service: Orthopedics;  Laterality: Left;  open reduction internal fixation trimalleolar ankle fracture  . TEE WITHOUT CARDIOVERSION N/A 04/01/2015   Procedure: TRANSESOPHAGEAL ECHOCARDIOGRAM (TEE);  Surgeon: Ivin Poot, MD;  Location: [place];  Service: Open Heart Surgery;  Laterality: N/A;  . TONSILLECTOMY    . TOTAL ABDOMINAL HYSTERECTOMY W/ BILATERAL SALPINGOOPHORECTOMY  1980   dysfunctional menses, endometriosis  . [place], 2009    Social History   Social History  . Marital status: Married    Spouse name: N/A  . Number of children: 2  . Years of education: N/A   Occupational History  . Retired Retired   Social History Main Topics  . Smoking status: Never Smoker  . Smokeless tobacco: Never Used  . Alcohol use No  . Drug use: No  . Sexual activity: Not Asked   Other Topics Concern  . None    Social History Narrative   REG EXERCISE   HAD COLONOSCOPY AND ENDOSCOPY DONE DATES UNKNOWN    Family History  Problem Relation Age of Onset  . Heart attack Father 80  . Alzheimer's disease Mother     TIAs  . Hyperlipidemia Brother   . Hypertension Brother   . Diverticulitis Daughter     S/P colectomy  . Colon cancer Maternal Grandmother   . Heart attack Paternal Grandmother 82  . Diabetes Neg Hx   . Stroke Neg Hx     Review of Systems  Constitutional: Negative for fever.  Respiratory: Negative for cough, shortness of breath and wheezing.   Cardiovascular: Positive for leg swelling (on lasix). Negative for chest pain and palpitations.  Gastrointestinal: Negative for abdominal pain and nausea.       No gerd  Neurological: Negative for dizziness, light-headedness and headaches.       Objective:   Vitals:   06/23/16 1518  BP: (!) 152/94  Pulse: 65  Resp: 16  Temp: 98 F (36.7 C)   Filed Weights   06/23/16 1518  Weight: 179 lb (81.2 kg)   Body mass index is 29.79 kg/m.   Physical Exam Constitutional: Appears well-developed and well-nourished. No distress.  HENT:  Head: Normocephalic and atraumatic.  Neck: Neck supple. No tracheal deviation present. No thyromegaly present.  Cardiovascular: Normal rate, regular rhythm and normal heart sounds.   No murmur heard. No carotid bruit  Pulmonary/Chest: Effort normal and breath sounds normal. No respiratory distress. No has no wheezes. No rales.  Musculoskeletal: moderate edema.  left ankle > mild edema right ankle Lymphadenopathy: No cervical adenopathy.  Skin: Skin is warm and dry. Not diaphoretic.  Psychiatric: Normal mood and affect. Behavior is normal.       Assessment & Plan:   See Problem List for Assessment and Plan of chronic medical problems.   F/u in 6 months

## 2016-06-23 NOTE — Assessment & Plan Note (Signed)
Compliant with diabetic diet, walking Check a1c every 6 months

## 2016-06-23 NOTE — Assessment & Plan Note (Signed)
dexa 10/2015 - osteopenia with high hip FRAX, history of fracture - warrants treatment Did not tolerate fosamax, would like to give prolia - will look into cost stressed regular walking Continue cal/vit d

## 2016-06-23 NOTE — Patient Instructions (Signed)
Call your insurance and see if a tetanus vaccine is covered.   Test(s) ordered today. Your results will be released to [place] (or called to you) after review, usually within 72hours after test completion. If any changes need to be made, you will be notified at that same time.  All other Health Maintenance issues reviewed.   All recommended immunizations and age-appropriate screenings are up-to-date or discussed.  No immunizations administered today.   Medications reviewed and updated.  No changes recommended at this time.  Your prescription(s) have been submitted to your pharmacy. Please take as directed and contact our office if you believe you are having problem(s) with the medication(s).  Please followup in 6 months for a wellness

## 2016-06-23 NOTE — Assessment & Plan Note (Signed)
Lipids well controlled Continue lipitor

## 2016-06-23 NOTE — Assessment & Plan Note (Signed)
Takes zantac 150 mg at night GERD controlled Continue daily medication

## 2016-06-23 NOTE — Assessment & Plan Note (Addendum)
Bp at home lower, better controlled Current regimen effective and well tolerated Continue current medications at current doses Continue to monitor at home

## 2016-06-30 ENCOUNTER — Encounter: Payer: Self-pay | Admitting: Emergency Medicine

## 2016-07-01 ENCOUNTER — Other Ambulatory Visit: Payer: Self-pay | Admitting: Internal Medicine

## 2016-07-12 DIAGNOSIS — M62830 Muscle spasm of back: Secondary | ICD-10-CM | POA: Diagnosis not present

## 2016-07-12 DIAGNOSIS — M9901 Segmental and somatic dysfunction of cervical region: Secondary | ICD-10-CM | POA: Diagnosis not present

## 2016-07-12 DIAGNOSIS — M545 Low back pain: Secondary | ICD-10-CM | POA: Diagnosis not present

## 2016-07-12 DIAGNOSIS — M9903 Segmental and somatic dysfunction of lumbar region: Secondary | ICD-10-CM | POA: Diagnosis not present

## 2016-07-13 DIAGNOSIS — M62830 Muscle spasm of back: Secondary | ICD-10-CM | POA: Diagnosis not present

## 2016-07-13 DIAGNOSIS — M545 Low back pain: Secondary | ICD-10-CM | POA: Diagnosis not present

## 2016-07-13 DIAGNOSIS — M9903 Segmental and somatic dysfunction of lumbar region: Secondary | ICD-10-CM | POA: Diagnosis not present

## 2016-07-13 DIAGNOSIS — M9901 Segmental and somatic dysfunction of cervical region: Secondary | ICD-10-CM | POA: Diagnosis not present

## 2016-07-15 DIAGNOSIS — M9903 Segmental and somatic dysfunction of lumbar region: Secondary | ICD-10-CM | POA: Diagnosis not present

## 2016-07-15 DIAGNOSIS — M545 Low back pain: Secondary | ICD-10-CM | POA: Diagnosis not present

## 2016-07-15 DIAGNOSIS — M9901 Segmental and somatic dysfunction of cervical region: Secondary | ICD-10-CM | POA: Diagnosis not present

## 2016-07-15 DIAGNOSIS — M62830 Muscle spasm of back: Secondary | ICD-10-CM | POA: Diagnosis not present

## 2016-07-16 ENCOUNTER — Other Ambulatory Visit: Payer: Self-pay | Admitting: Internal Medicine

## 2016-07-18 ENCOUNTER — Other Ambulatory Visit: Payer: Self-pay | Admitting: Cardiology

## 2016-07-19 DIAGNOSIS — M9901 Segmental and somatic dysfunction of cervical region: Secondary | ICD-10-CM | POA: Diagnosis not present

## 2016-07-19 DIAGNOSIS — M545 Low back pain: Secondary | ICD-10-CM | POA: Diagnosis not present

## 2016-07-19 DIAGNOSIS — M62830 Muscle spasm of back: Secondary | ICD-10-CM | POA: Diagnosis not present

## 2016-07-19 DIAGNOSIS — M9903 Segmental and somatic dysfunction of lumbar region: Secondary | ICD-10-CM | POA: Diagnosis not present

## 2016-07-20 ENCOUNTER — Encounter: Payer: Self-pay | Admitting: Internal Medicine

## 2016-07-21 ENCOUNTER — Telehealth: Payer: Self-pay

## 2016-07-21 DIAGNOSIS — M545 Low back pain: Secondary | ICD-10-CM | POA: Diagnosis not present

## 2016-07-21 DIAGNOSIS — M9901 Segmental and somatic dysfunction of cervical region: Secondary | ICD-10-CM | POA: Diagnosis not present

## 2016-07-21 DIAGNOSIS — M62830 Muscle spasm of back: Secondary | ICD-10-CM | POA: Diagnosis not present

## 2016-07-21 DIAGNOSIS — M9903 Segmental and somatic dysfunction of lumbar region: Secondary | ICD-10-CM | POA: Diagnosis not present

## 2016-07-21 NOTE — Telephone Encounter (Signed)
I recd request from dr burns to start patient on prolia injections---last bone density is showing t-score 1.8 and at moderate fracture risk---routing to dr burns, has the patient experienced a fracture? Patient may not qualify for insurance to cover with low tscore and no fracture--- Please advise, thanks

## 2016-07-21 NOTE — Telephone Encounter (Signed)
Previous ankle fracture  Osteopenia with high hip FRAX score with previous fracture - clinically osteoporosis  Warrants treatment

## 2016-07-22 DIAGNOSIS — M9901 Segmental and somatic dysfunction of cervical region: Secondary | ICD-10-CM | POA: Diagnosis not present

## 2016-07-22 DIAGNOSIS — M545 Low back pain: Secondary | ICD-10-CM | POA: Diagnosis not present

## 2016-07-22 DIAGNOSIS — M62830 Muscle spasm of back: Secondary | ICD-10-CM | POA: Diagnosis not present

## 2016-07-22 DIAGNOSIS — M9903 Segmental and somatic dysfunction of lumbar region: Secondary | ICD-10-CM | POA: Diagnosis not present

## 2016-07-22 NOTE — Telephone Encounter (Signed)
Patient's insurance info submitted into prolia portal for insurance verification---I will call patient after I receive insurance information

## 2016-07-27 ENCOUNTER — Other Ambulatory Visit: Payer: Self-pay

## 2016-07-27 DIAGNOSIS — M545 Low back pain: Secondary | ICD-10-CM | POA: Diagnosis not present

## 2016-07-27 DIAGNOSIS — M62830 Muscle spasm of back: Secondary | ICD-10-CM | POA: Diagnosis not present

## 2016-07-27 DIAGNOSIS — M9901 Segmental and somatic dysfunction of cervical region: Secondary | ICD-10-CM | POA: Diagnosis not present

## 2016-07-27 DIAGNOSIS — M9903 Segmental and somatic dysfunction of lumbar region: Secondary | ICD-10-CM | POA: Diagnosis not present

## 2016-07-27 MED ORDER — METOPROLOL TARTRATE 25 MG PO TABS: 37.5000 mg | ORAL_TABLET | Freq: Two times a day (BID) | ORAL | 1 refills | Status: DC

## 2016-07-27 NOTE — Telephone Encounter (Signed)
Rx(s) sent to pharmacy electronically.  

## 2016-08-20 ENCOUNTER — Ambulatory Visit (HOSPITAL_COMMUNITY): Payer: PPO | Attending: Cardiology

## 2016-08-20 ENCOUNTER — Other Ambulatory Visit: Payer: Self-pay

## 2016-08-20 DIAGNOSIS — I071 Rheumatic tricuspid insufficiency: Secondary | ICD-10-CM | POA: Insufficient documentation

## 2016-08-20 DIAGNOSIS — I4891 Unspecified atrial fibrillation: Secondary | ICD-10-CM

## 2016-08-20 DIAGNOSIS — I77819 Aortic ectasia, unspecified site: Secondary | ICD-10-CM | POA: Diagnosis not present

## 2016-08-23 ENCOUNTER — Telehealth: Payer: Self-pay | Admitting: Cardiology

## 2016-08-23 NOTE — Telephone Encounter (Signed)
Pt of Dr. Stanford Breed Hx of A Fib  Spoke to patient. She states concern that she has been getting out of range pulse readings on her automatic BP cuff. Notes readings of 37, 48, 50, etc. BPs are in normal range - [id number] systolic. We discussed possibility that she is in A Fib and I explained that often automatic cuffs will not capture accurate pulse readings if this is the case.  She denies SOB, chest pain, palpitations, etc. States she is a little more fatigued than usual - denies other symptoms. Scheduled to see Dr. Stanford Breed this Fri. Wants to know if anything advised in interim. Recommended patient follow up Fri as scheduled & that we would communicate any recommendations in interim. Continue current medications. Aware to call if new symptoms/problems. Pt voiced acknowledgment & thanks.

## 2016-08-23 NOTE — Telephone Encounter (Signed)
Will review Friday Kirk Ruths

## 2016-08-23 NOTE — Telephone Encounter (Signed)
New message    Pt calling the nurse Debra per [place]. No further info given. Please call.

## 2016-08-24 NOTE — Progress Notes (Signed)
HPI: FU CAD and atrial myxoma resection. Echo 4/16 orderded by primary care for cardiomegaly; this revealed normal LV function, left atrial mass; mild MR. Cardiac cath 5/16 showed 80 LAD. Preop carotid dopplers 5/16 with no significant stenosis. Patient had CABG with LIMA to LAD, resection of atrial myxoma and LAA oversewn. Echo 7/16 showed normal LV function, grade 2 diastolic dysfunction, mild MR, mild biatrial enlargement and moderate TR. Patient did have postoperative atrial fibrillation treated with amiodarone. Patient had recurrent atrial fibrillation in September 2016. She has been treated with rate controlling medications and anticoagulation. Echocardiogram September 2017 showed normal LV systolic function, mild mitral regurgitation, mild left atrial enlargement, moderate tricuspid regurgitation and moderately elevated pulmonary pressures. No recurrent myxoma. Since last seen, she has mild dizziness with standing. No syncope. She denies dyspnea or chest pain.  Current Outpatient Prescriptions  Medication Sig Dispense Refill  . atorvastatin (LIPITOR) 10 MG tablet Take 1 tablet (10 mg total) by mouth daily at 6 PM. 30 tablet 11  . calcium citrate-vitamin D (CITRACAL+D) 315-200 MG-UNIT per tablet Take 2 tablets by mouth 2 (two) times daily.    . Cholecalciferol (VITAMIN D3) 1000 UNITS CAPS Take 1,000 Units by mouth daily.     . COMBIVENT RESPIMAT 20-100 MCG/ACT AERS respimat Inhale 1 puff into the lungs as needed. 4 g 5  . dicyclomine (BENTYL) 20 MG tablet TAKE 1 TABLET (20 MG TOTAL) BY MOUTH EVERY 6 (SIX) HOURS AS NEEDED FOR SPASMS. 30 tablet 1  . docusate sodium (COLACE) 100 MG capsule Take 100 mg by mouth daily.    Marland Kitchen ELIQUIS 5 MG TABS tablet TAKE 1 TABLET (5 MG TOTAL) BY MOUTH 2 (TWO) TIMES DAILY. 60 tablet 5  . fexofenadine (ALLEGRA) 180 MG tablet Take 180 mg by mouth daily. Pt takes daily    . furosemide (LASIX) 40 MG tablet Take 1 tablet (40 mg total) by mouth daily. 90 tablet 1  .  Glucosamine HCl-MSM (GLUCOSAMINE-MSM PO) Take by mouth. 1500/1500 Take 3 Capsules w/ meal of choice.    Marland Kitchen latanoprost (XALATAN) 0.005 % ophthalmic solution Place 1 drop into both eyes daily.      Marland Kitchen losartan (COZAAR) 25 MG tablet Take 1 tablet (25 mg total) by mouth daily. 90 tablet 3  . metoprolol tartrate (LOPRESSOR) 25 MG tablet Take 1.5 tablets (37.5 mg total) by mouth 2 (two) times daily. (Patient taking differently: Take 25 mg by mouth 2 (two) times daily. ) 270 tablet 1  . montelukast (SINGULAIR) 10 MG tablet Take 1 tablet (10 mg total) by mouth at bedtime. 90 tablet 2  . Multiple Vitamin (MULITIVITAMIN WITH MINERALS) TABS Take 1 tablet by mouth daily.    . ranitidine (ZANTAC) 150 MG tablet Take 1 tablet (150 mg total) by mouth at bedtime.    . traMADol (ULTRAM) 50 MG tablet Take 1 tablet (50 mg total) by mouth every 6 (six) hours as needed. Dr.Ramos (Patient taking differently: Take 50 mg by mouth every 6 (six) hours as needed. Dr.Ramos; pt takes 1 tablet every night, maybe 1 during the day as needed) 30 tablet 0   Current Facility-Administered Medications  Medication Dose Route Frequency Provider Last Rate Last Dose  . pneumococcal 13-valent conjugate vaccine (PREVNAR 13) injection 0.5 mL  0.5 mL Intramuscular Tomorrow-1000 Hendricks Limes, MD         Past Medical History:  Diagnosis Date  . Atrial fibrillation ([place])    Post op after repair of ankle fracture  .  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 CAD (coronary artery disease) 03/2015   LAD 80%, otherwise minimal CAD. s/p LIMA-LAD  . Diverticulitis 2002   based on CT scan  . Diverticulosis    Dr Henrene Pastor  . DVT (deep venous thrombosis) ([place]) 2000   no trigger  . Endometriosis   . Gastritis 2006   @ Endo  . GERD (gastroesophageal reflux disease)   . Hiatal hernia   . Hyperlipidemia   . Hypertension   . IBS (irritable bowel syndrome)   . Ocular hypertension    glaucoma suspect, Dr.  Kathrin Penner  . Skin cancer   . Superficial phlebitis     X 2, 1999, 2000  . Syncope 11/12/11   in context CAP . Carotid Doppler exam was negative. 2D echocardiogram revealed mild dilation of the left atrium and moderate increase in the systolic pressureof  the pulmonary artery  . UTI (lower urinary tract infection) 01/2013   Strep bovis ; PCN sensitive    Past Surgical History:  Procedure Laterality Date  . CARDIAC CATHETERIZATION N/A 03/28/2015   Procedure: Left Heart Cath and Coronary Angiography;  Surgeon: Jolaine Artist, MD;  Location: [facility];  Service: Cardiovascular;  Laterality: N/A;  . CATARACT EXTRACTION Bilateral   . CATARACT EXTRACTION W/ INTRAOCULAR LENS IMPLANT     OS; Dr Kathrin Penner  . COLONOSCOPY  1995, 2002, 2012   diverticulosis; Dr Henrene Pastor  . CORONARY ARTERY BYPASS GRAFT N/A 04/01/2015   Procedure: CORONARY ARTERY BYPASS GRAFTING (CABG), ON PUMP, TIMES ONE, USING LEFT INTERNAL MAMMARY ARTERY;  Surgeon: Ivin Poot, MD;  Location: [facility];  Service: Open Heart Surgery;  Laterality: N/A;  . EXCISION OF ATRIAL MYXOMA N/A 04/01/2015   Procedure: EXCISION OF ATRIAL MYXOMA;  Surgeon: Ivin Poot, MD;  Location: [facility];  Service: Open Heart Surgery;  Laterality: N/A;  . ORIF ANKLE FRACTURE  11/14/2011   Procedure: OPEN REDUCTION INTERNAL FIXATION (ORIF) ANKLE FRACTURE;  Surgeon: Colin Rhein;  Location: [facility];  Service: Orthopedics;  Laterality: Left;  open reduction internal fixation trimalleolar ankle fracture  . TEE WITHOUT CARDIOVERSION N/A 04/01/2015   Procedure: TRANSESOPHAGEAL ECHOCARDIOGRAM (TEE);  Surgeon: Ivin Poot, MD;  Location: [facility];  Service: Open Heart Surgery;  Laterality: N/A;  . TONSILLECTOMY    . TOTAL ABDOMINAL HYSTERECTOMY W/ BILATERAL SALPINGOOPHORECTOMY  1980   dysfunctional menses, endometriosis  . [place], 2009    Social History   Social History  . Marital status: Married    Spouse  name: N/A  . Number of children: 2  . Years of education: N/A   Occupational History  . Retired Retired   Social History Main Topics  . Smoking status: Never Smoker  . Smokeless tobacco: Never Used  . Alcohol use No  . Drug use: No  . Sexual activity: Not on file   Other Topics Concern  . Not on file   Social History Narrative   REG EXERCISE   HAD COLONOSCOPY AND ENDOSCOPY DONE DATES UNKNOWN    Family History  Problem Relation Age of Onset  . Heart attack Father 69  . Alzheimer's disease Mother 56    TIAs  . Colon cancer Maternal Grandmother   . Heart attack Paternal Grandmother 82  . Hyperlipidemia Brother   . Hypertension Brother   . Diverticulitis Daughter     S/P colectomy  .  Diabetes Neg Hx   . Stroke Neg Hx     ROS: no fevers or chills, productive cough, hemoptysis, dysphasia, odynophagia, melena, hematochezia, dysuria, hematuria, rash, seizure activity, orthopnea, PND, pedal edema, claudication. Remaining systems are negative.  Physical Exam: Well-developed well-nourished in no acute distress.  Skin is warm and dry.  HEENT is normal.  Neck is supple.  Chest is clear to auscultation with normal expansion.  Cardiovascular exam is regular rate and rhythm.  Abdominal exam nontender or distended. No masses palpated. Extremities show no edema. neuro grossly intact  Electrocardiogram shows marked sinus bradycardia at a rate of 46. Normal axis. Right bundle branch block.  A/P  1 Paroxysmal atrial fibrillation-patient remains in sinus rhythm. Continue metoprolol (decrease to 12.5 BID given bradycardia) and apixaban; check Hgb; recent Cr 0.99.  2 status post resection of atrial myxoma-no recurrence on most recent echo.  3 coronary artery disease-continue statin. No aspirin given need for anticoagulation.  4 hypertension-blood pressure controlled. However she is bradycardic. Decrease metoprolol to 12.5 mg twice a day.  5 hyperlipidemia-continue  statin.   Kirk Ruths, MD

## 2016-08-27 ENCOUNTER — Encounter: Payer: Self-pay | Admitting: Cardiology

## 2016-08-27 ENCOUNTER — Ambulatory Visit (INDEPENDENT_AMBULATORY_CARE_PROVIDER_SITE_OTHER): Payer: PPO | Admitting: Cardiology

## 2016-08-27 VITALS — BP 112/76 | HR 46 | Ht 67.0 in | Wt 178.0 lb

## 2016-08-27 DIAGNOSIS — I480 Paroxysmal atrial fibrillation: Secondary | ICD-10-CM

## 2016-08-27 DIAGNOSIS — I10 Essential (primary) hypertension: Secondary | ICD-10-CM | POA: Diagnosis not present

## 2016-08-27 DIAGNOSIS — I25810 Atherosclerosis of coronary artery bypass graft(s) without angina pectoris: Secondary | ICD-10-CM

## 2016-08-27 DIAGNOSIS — D151 Benign neoplasm of heart: Secondary | ICD-10-CM

## 2016-08-27 LAB — CBC
HEMATOCRIT: 42.1 % (ref 35.0–45.0)
Hemoglobin: 14.3 g/dL (ref 11.7–15.5)
MCH: 30.4 pg (ref 27.0–33.0)
MCHC: 34 g/dL (ref 32.0–36.0)
MCV: 89.6 fL (ref 80.0–100.0)
MPV: 9.6 fL (ref 7.5–12.5)
PLATELETS: 232 10*3/uL (ref 140–400)
RBC: 4.7 MIL/uL (ref 3.80–5.10)
RDW: 13.3 % (ref 11.0–15.0)
WBC: 6.5 10*3/uL (ref 3.8–10.8)

## 2016-08-27 MED ORDER — METOPROLOL TARTRATE 25 MG PO TABS: 12.5000 mg | ORAL_TABLET | Freq: Two times a day (BID) | ORAL | 3 refills | Status: DC

## 2016-08-27 NOTE — Patient Instructions (Signed)
Medication Instructions:   DECREASE METOPROLOL TO 12.5 MG TWICE DAILY= 1/2 OF THE 25 MG TABLET TWICE DAILY  Labwork:  Your physician recommends that you HAVE LAB WORK TODAY  Follow-Up:  Your physician wants you to follow-up in: [place] will receive a reminder letter in the mail two months in advance. If you don't receive a letter, please call our office to schedule the follow-up appointment.   If you need a refill on your cardiac medications before your next appointment, please call your pharmacy.

## 2016-08-30 ENCOUNTER — Encounter: Payer: Self-pay | Admitting: *Deleted

## 2016-08-31 ENCOUNTER — Telehealth: Payer: Self-pay | Admitting: Internal Medicine

## 2016-08-31 NOTE — Telephone Encounter (Signed)
Patient believes she needs to have other injections. She has her flu and pneumonia shots already sent up. I am unsure what else she is needing to set up. Can you please follow up with her. Thank you.

## 2016-09-01 NOTE — Telephone Encounter (Signed)
Spoke with pt yesterday. Informed her that she was only due for a tdap and flu shot. All other immunizations were up to date. Cancelled appt for Pneumo vac.

## 2016-09-07 ENCOUNTER — Ambulatory Visit (INDEPENDENT_AMBULATORY_CARE_PROVIDER_SITE_OTHER): Payer: PPO

## 2016-09-07 DIAGNOSIS — Z23 Encounter for immunization: Secondary | ICD-10-CM | POA: Diagnosis not present

## 2016-09-16 ENCOUNTER — Other Ambulatory Visit: Payer: Self-pay | Admitting: Cardiology

## 2016-09-24 ENCOUNTER — Ambulatory Visit: Payer: PPO

## 2016-10-06 DIAGNOSIS — H40011 Open angle with borderline findings, low risk, right eye: Secondary | ICD-10-CM | POA: Diagnosis not present

## 2016-10-06 DIAGNOSIS — H401122 Primary open-angle glaucoma, left eye, moderate stage: Secondary | ICD-10-CM | POA: Diagnosis not present

## 2016-10-20 DIAGNOSIS — H401111 Primary open-angle glaucoma, right eye, mild stage: Secondary | ICD-10-CM | POA: Diagnosis not present

## 2016-10-20 DIAGNOSIS — H01001 Unspecified blepharitis right upper eyelid: Secondary | ICD-10-CM | POA: Diagnosis not present

## 2016-10-20 DIAGNOSIS — H401122 Primary open-angle glaucoma, left eye, moderate stage: Secondary | ICD-10-CM | POA: Diagnosis not present

## 2016-10-20 DIAGNOSIS — H26491 Other secondary cataract, right eye: Secondary | ICD-10-CM | POA: Diagnosis not present

## 2016-11-08 ENCOUNTER — Other Ambulatory Visit: Payer: Self-pay | Admitting: Internal Medicine

## 2016-11-29 ENCOUNTER — Ambulatory Visit (INDEPENDENT_AMBULATORY_CARE_PROVIDER_SITE_OTHER): Payer: PPO | Admitting: Internal Medicine

## 2016-11-29 ENCOUNTER — Encounter: Payer: Self-pay | Admitting: Internal Medicine

## 2016-11-29 VITALS — BP 142/80 | HR 48 | Temp 97.9°F | Resp 16 | Wt 180.0 lb

## 2016-11-29 DIAGNOSIS — Z23 Encounter for immunization: Secondary | ICD-10-CM

## 2016-11-29 DIAGNOSIS — J069 Acute upper respiratory infection, unspecified: Secondary | ICD-10-CM | POA: Diagnosis not present

## 2016-11-29 DIAGNOSIS — K219 Gastro-esophageal reflux disease without esophagitis: Secondary | ICD-10-CM | POA: Diagnosis not present

## 2016-11-29 MED ORDER — HYDROCODONE-HOMATROPINE 5-1.5 MG/5ML PO SYRP: 5.0000 mL | ORAL_SOLUTION | Freq: Three times a day (TID) | ORAL | 0 refills | Status: DC | PRN

## 2016-11-29 MED ORDER — OMEPRAZOLE 20 MG PO CPDR: 20.0000 mg | DELAYED_RELEASE_CAPSULE | Freq: Every day | ORAL | 3 refills | Status: DC

## 2016-11-29 MED ORDER — AMOXICILLIN-POT CLAVULANATE 875-125 MG PO TABS: 1.0000 | ORAL_TABLET | Freq: Two times a day (BID) | ORAL | 0 refills | Status: DC

## 2016-11-29 NOTE — Progress Notes (Signed)
Pre visit review using our clinic review tool, if applicable. No additional management support is needed unless otherwise documented below in the visit note. 

## 2016-11-29 NOTE — Assessment & Plan Note (Signed)
Sore throat it likely needed for uncontrolled GERD Start omeprazole 20 mg daily

## 2016-11-29 NOTE — Progress Notes (Signed)
Subjective:    Patient ID: Valerie Rollins, female    DOB: December 02, 1935, 81 y.o.   MRN: [id number]  HPI She is here for an acute visit for cold symptoms.   Her symptoms started last week.  She states nasal congestion, rhinorrhea, sneezing, sore throat, cough, SOB, wheeze and Headaches.  Her cough is dry.  Her SOB is only when she is exposed to the cold air.  She denies fever, ear pain, sinus pain, abdomina, pain and diarrhea. She has taken cold eeze.  She did not take anything else.   Sore throat:  She had a sore throat for a while before her cold symptoms started.  she was taking zantac daily, but it did not seem to help.  She wonders if the sore throat is from GERD.  She denies reflux symptoms, but has a history of it.       Medications and allergies reviewed with patient and updated if appropriate.  Patient Active Problem List   Diagnosis Date Noted  . Prediabetes 01/10/2016  . Osteopenia 12/26/2015  . Mass of neck 07/07/2015  . Encounter for therapeutic drug monitoring 05/14/2015  . Long-term (current) use of anticoagulants 04/16/2015  . S/P CABG x 1 04/01/2015  . CAD (coronary artery disease) 03/28/2015  . 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 Abnormal thyroid blood test 03/02/2015  . Cardiomegaly 02/20/2015  . Hypotension, postural 06/07/2013  . DDD (degenerative disc disease), lumbar 07/20/2012  . Atrial fibrillation ([place]) 11/15/2011  . Syncope 11/12/2011  . Hypokalemia 11/12/2011  . RBBB (right bundle branch block) 11/12/2011  . HTN (hypertension), malignant 11/12/2011  . Ankle fracture, left 11/12/2011  . HOARSENESS 02/23/2010  . SKIN CANCER, HX OF 11/18/2009  . Diverticulosis of large intestine 10/31/2008  . HEART MURMUR, SYSTOLIC [id number]  . VARICOSE VEINS, LOWER EXTREMITIES 03/13/2008  . GERD 02/20/2008  . IBS 02/20/2008  . OSTEOARTHRITIS 02/20/2008  . Hyperlipidemia 12/01/2006  . Essential hypertension 12/01/2006  . SUPERFICIAL THROMBOPHLEBITIS 12/01/2006  . DVT, HX  OF 12/01/2006    Current Outpatient Prescriptions on File Prior to Visit  Medication Sig Dispense Refill  . atorvastatin (LIPITOR) 10 MG tablet Take 1 tablet (10 mg total) by mouth daily at 6 PM. 30 tablet 11  . calcium citrate-vitamin D (CITRACAL+D) 315-200 MG-UNIT per tablet Take 2 tablets by mouth 2 (two) times daily.    . Cholecalciferol (VITAMIN D3) 1000 UNITS CAPS Take 1,000 Units by mouth daily.     . COMBIVENT RESPIMAT 20-100 MCG/ACT AERS respimat Inhale 1 puff into the lungs as needed. 4 g 5  . dicyclomine (BENTYL) 20 MG tablet TAKE 1 TABLET (20 MG TOTAL) BY MOUTH EVERY 6 (SIX) HOURS AS NEEDED FOR SPASMS. 30 tablet 2  . docusate sodium (COLACE) 100 MG capsule Take 100 mg by mouth daily.    Marland Kitchen ELIQUIS 5 MG TABS tablet TAKE 1 TABLET (5 MG TOTAL) BY MOUTH 2 (TWO) TIMES DAILY. 60 tablet 5  . fexofenadine (ALLEGRA) 180 MG tablet Take 180 mg by mouth daily. Pt takes daily    . furosemide (LASIX) 40 MG tablet Take 1 tablet (40 mg total) by mouth daily. 90 tablet 1  . Glucosamine HCl-MSM (GLUCOSAMINE-MSM PO) Take by mouth. 1500/1500 Take 3 Capsules w/ meal of choice.    Marland Kitchen latanoprost (XALATAN) 0.005 % ophthalmic solution Place 1 drop into both eyes daily.      Marland Kitchen losartan (COZAAR) 25 MG tablet TAKE 1 TABLET (25 MG TOTAL) BY MOUTH DAILY. 90 tablet 3  .  metoprolol tartrate (LOPRESSOR) 25 MG tablet Take 0.5 tablets (12.5 mg total) by mouth 2 (two) times daily. 90 tablet 3  . montelukast (SINGULAIR) 10 MG tablet Take 1 tablet (10 mg total) by mouth at bedtime. 90 tablet 2  . Multiple Vitamin (MULITIVITAMIN WITH MINERALS) TABS Take 1 tablet by mouth daily.    . traMADol (ULTRAM) 50 MG tablet Take 1 tablet (50 mg total) by mouth every 6 (six) hours as needed. Dr.Ramos (Patient taking differently: Take 50 mg by mouth every 6 (six) hours as needed. Dr.Ramos; pt takes 1 tablet every night, maybe 1 during the day as needed) 30 tablet 0   Current Facility-Administered Medications on File Prior to Visit    Medication Dose Route Frequency Provider Last Rate Last Dose  . pneumococcal 13-valent conjugate vaccine (PREVNAR 13) injection 0.5 mL  0.5 mL Intramuscular Tomorrow-1000 Hendricks Limes, MD        Past Medical History:  Diagnosis Date  . Atrial fibrillation ([place])    Post op after repair of ankle fracture  . 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 CAD (coronary artery disease) 03/2015   LAD 80%, otherwise minimal CAD. s/p LIMA-LAD  . Diverticulitis 2002   based on CT scan  . Diverticulosis    Dr Henrene Pastor  . DVT (deep venous thrombosis) ([place]) 2000   no trigger  . Endometriosis   . Gastritis 2006   @ Endo  . GERD (gastroesophageal reflux disease)   . Hiatal hernia   . Hyperlipidemia   . Hypertension   . IBS (irritable bowel syndrome)   . Ocular hypertension    glaucoma suspect, Dr. Kathrin Penner  . Skin cancer   . Superficial phlebitis     X 2, 1999, 2000  . Syncope 11/12/11   in context CAP . Carotid Doppler exam was negative. 2D echocardiogram revealed mild dilation of the left atrium and moderate increase in the systolic pressureof  the pulmonary artery  . UTI (lower urinary tract infection) 01/2013   Strep bovis ; PCN sensitive    Past Surgical History:  Procedure Laterality Date  . CARDIAC CATHETERIZATION N/A 03/28/2015   Procedure: Left Heart Cath and Coronary Angiography;  Surgeon: Jolaine Artist, MD;  Location: [facility];  Service: Cardiovascular;  Laterality: N/A;  . CATARACT EXTRACTION Bilateral   . CATARACT EXTRACTION W/ INTRAOCULAR LENS IMPLANT     OS; Dr Kathrin Penner  . COLONOSCOPY  1995, 2002, 2012   diverticulosis; Dr Henrene Pastor  . CORONARY ARTERY BYPASS GRAFT N/A 04/01/2015   Procedure: CORONARY ARTERY BYPASS GRAFTING (CABG), ON PUMP, TIMES ONE, USING LEFT INTERNAL MAMMARY ARTERY;  Surgeon: Ivin Poot, MD;  Location: [facility];  Service: Open Heart Surgery;  Laterality: N/A;  . EXCISION OF ATRIAL MYXOMA N/A  04/01/2015   Procedure: EXCISION OF ATRIAL MYXOMA;  Surgeon: Ivin Poot, MD;  Location: [facility];  Service: Open Heart Surgery;  Laterality: N/A;  . ORIF ANKLE FRACTURE  11/14/2011   Procedure: OPEN REDUCTION INTERNAL FIXATION (ORIF) ANKLE FRACTURE;  Surgeon: Valerie Rhein;  Location: [facility];  Service: Orthopedics;  Laterality: Left;  open reduction internal fixation trimalleolar ankle fracture  . TEE WITHOUT CARDIOVERSION N/A 04/01/2015   Procedure: TRANSESOPHAGEAL ECHOCARDIOGRAM (TEE);  Surgeon: Ivin Poot, MD;  Location: [facility];  Service: Open Heart Surgery;  Laterality: N/A;  . TONSILLECTOMY    . TOTAL ABDOMINAL HYSTERECTOMY W/ BILATERAL SALPINGOOPHORECTOMY  1980  dysfunctional menses, endometriosis  . [place], 2009    Social History   Social History  . Marital status: Married    Spouse name: N/A  . Number of children: 2  . Years of education: N/A   Occupational History  . Retired Retired   Social History Main Topics  . Smoking status: Never Smoker  . Smokeless tobacco: Never Used  . Alcohol use No  . Drug use: No  . Sexual activity: Not on file   Other Topics Concern  . Not on file   Social History Narrative   REG EXERCISE   HAD COLONOSCOPY AND ENDOSCOPY DONE DATES UNKNOWN    Family History  Problem Relation Age of Onset  . Heart attack Father 29  . Alzheimer's disease Mother 54    TIAs  . Colon cancer Maternal Grandmother   . Heart attack Paternal Grandmother 82  . Hyperlipidemia Brother   . Hypertension Brother   . Diverticulitis Daughter     S/P colectomy  . Diabetes Neg Hx   . Stroke Neg Hx     Review of Systems  Constitutional: Negative for chills and fever.  HENT: Positive for congestion, rhinorrhea (clear), sneezing and sore throat. Negative for ear pain, sinus pain and sinus pressure.   Respiratory: Positive for cough (dry ), shortness of breath (with cold weather) and wheezing.   Gastrointestinal: Negative for  abdominal pain, diarrhea and nausea.  Musculoskeletal: Negative for myalgias.  Neurological: Positive for headaches. Negative for dizziness and light-headedness.       Objective:   Vitals:   11/29/16 1603  BP: (!) 142/80  Pulse: (!) 48  Resp: 16  Temp: 97.9 F (36.6 C)   Filed Weights   11/29/16 1603  Weight: 180 lb (81.6 kg)   Body mass index is 28.19 kg/m.  Wt Readings from Last 3 Encounters:  11/29/16 180 lb (81.6 kg)  08/27/16 178 lb (80.7 kg)  06/23/16 179 lb (81.2 kg)     Physical Exam GENERAL APPEARANCE: Appears stated age, well appearing, NAD EYES: conjunctiva clear, no icterus HEENT: bilateral tympanic membranes and ear canals normal, oropharynx with mild erythema, no thyromegaly, trachea midline, no cervical or supraclavicular lymphadenopathy LUNGS: Clear to auscultation without wheeze or crackles, unlabored breathing, good air entry bilaterally HEART: Normal S1,S2 without murmurs EXTREMITIES: Without clubbing, cyanosis, or edema        Assessment & Plan:   tdap due - will give today  See Problem List for Assessment and Plan of chronic medical problems.

## 2016-11-29 NOTE — Assessment & Plan Note (Signed)
Likely viral in nature Hycodan as needed OTC cold meds as needed Rest, fluids Augmentin prescription given - use only if needed if symptoms do not improve

## 2016-11-29 NOTE — Patient Instructions (Addendum)
  Medications reviewed and updated.  Changes include starting a cough medication at nighttime.  An antibiotic was prescribed - take only if you need it.   Restart the omeprazole 20 mg daily.     Your prescription(s) have been submitted to your pharmacy. Please take as directed and contact our office if you believe you are having problem(s) with the medication(s).

## 2016-12-13 ENCOUNTER — Telehealth: Payer: Self-pay

## 2016-12-13 NOTE — Telephone Encounter (Signed)
Insurance information has been submitted for prolia injections---patient needs to start Prolia per dr burns, has already tried fosamax---estimated copay $240---but needs prior authorization---PA request has been started, will be signed by Dr Quay Burow and faxed back to [employer]---will call patient once PA is approved---can talk with tamara if any questions----NEED PA before we can schedule prolia injection

## 2016-12-17 DIAGNOSIS — M5136 Other intervertebral disc degeneration, lumbar region: Secondary | ICD-10-CM | POA: Diagnosis not present

## 2016-12-17 DIAGNOSIS — Z79891 Long term (current) use of opiate analgesic: Secondary | ICD-10-CM | POA: Diagnosis not present

## 2016-12-17 DIAGNOSIS — M48061 Spinal stenosis, lumbar region without neurogenic claudication: Secondary | ICD-10-CM | POA: Diagnosis not present

## 2016-12-31 ENCOUNTER — Ambulatory Visit (INDEPENDENT_AMBULATORY_CARE_PROVIDER_SITE_OTHER): Payer: PPO | Admitting: Internal Medicine

## 2016-12-31 ENCOUNTER — Encounter: Payer: Self-pay | Admitting: Internal Medicine

## 2016-12-31 VITALS — BP 158/82 | HR 80 | Temp 98.0°F | Resp 16 | Ht 67.0 in | Wt 180.0 lb

## 2016-12-31 DIAGNOSIS — K219 Gastro-esophageal reflux disease without esophagitis: Secondary | ICD-10-CM | POA: Diagnosis not present

## 2016-12-31 DIAGNOSIS — R7303 Prediabetes: Secondary | ICD-10-CM

## 2016-12-31 DIAGNOSIS — M8580 Other specified disorders of bone density and structure, unspecified site: Secondary | ICD-10-CM | POA: Diagnosis not present

## 2016-12-31 DIAGNOSIS — R600 Localized edema: Secondary | ICD-10-CM | POA: Insufficient documentation

## 2016-12-31 DIAGNOSIS — I480 Paroxysmal atrial fibrillation: Secondary | ICD-10-CM

## 2016-12-31 DIAGNOSIS — I10 Essential (primary) hypertension: Secondary | ICD-10-CM

## 2016-12-31 DIAGNOSIS — Z0000 Encounter for general adult medical examination without abnormal findings: Secondary | ICD-10-CM | POA: Diagnosis not present

## 2016-12-31 MED ORDER — METOPROLOL TARTRATE 25 MG PO TABS: 25.0000 mg | ORAL_TABLET | Freq: Two times a day (BID) | ORAL | 3 refills | Status: DC

## 2016-12-31 MED ORDER — DENOSUMAB 60 MG/ML ~~LOC~~ SOLN
60.0000 mg | Freq: Once | SUBCUTANEOUS | Status: AC
  Administered 2016-12-31: 60 mg via SUBCUTANEOUS

## 2016-12-31 NOTE — Assessment & Plan Note (Signed)
Blood pressure elevated today and is in A. fib Will increase metoprolol back up to 25 mg twice daily Should follow up with Dr. Stanford Breed or myself in the next 2 weeks, sooner if needed Check labs week when she is fasting

## 2016-12-31 NOTE — Progress Notes (Signed)
Subjective:    Patient ID: Valerie Rollins, female    DOB: 05/25/1936, 81 y.o.   MRN: [id number]  HPI She is here for a physical exam.   Fluid retention: Several days of increased edema.  Lasix does not seem to be working as well.  She is compliant with a low sodium diet. She does elevate her legs when possible. She does struggle with lower extremity edema, but typically her Lasix takes care of it.  She usually walks for exercise - she has not done it recently since being sick.  She knows needs to get back to walking.   She was told by the nurse that she is in atrial fibrillation. She states her metoprolol dose was decreased by Dr. Stanford Breed when she saw him last. On occasion she does feel palpitations she denies any chest pain. She denies any increase in her chronic shortness of breath, which she usually has with exertion.  Medications and allergies reviewed with patient and updated if appropriate.  Patient Active Problem List   Diagnosis Date Noted  . Prediabetes 01/10/2016  . Osteopenia 12/26/2015  . Mass of neck 07/07/2015  . Encounter for therapeutic drug monitoring 05/14/2015  . Long-term (current) use of anticoagulants 04/16/2015  . S/P CABG x 1 04/01/2015  . CAD (coronary artery disease) 03/28/2015  . 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 Cardiomegaly 02/20/2015  . Hypotension, postural 06/07/2013  . DDD (degenerative disc disease), lumbar 07/20/2012  . Atrial fibrillation ([place]) 11/15/2011  . Syncope 11/12/2011  . Hypokalemia 11/12/2011  . RBBB (right bundle branch block) 11/12/2011  . HTN (hypertension), malignant 11/12/2011  . HOARSENESS 02/23/2010  . SKIN CANCER, HX OF 11/18/2009  . Diverticulosis of large intestine 10/31/2008  . HEART MURMUR, SYSTOLIC [id number]  . VARICOSE VEINS, LOWER EXTREMITIES 03/13/2008  . GERD 02/20/2008  . IBS 02/20/2008  . OSTEOARTHRITIS 02/20/2008  . Hyperlipidemia 12/01/2006  . Essential hypertension 12/01/2006  . SUPERFICIAL THROMBOPHLEBITIS  12/01/2006  . DVT, HX OF 12/01/2006    Current Outpatient Prescriptions on File Prior to Visit  Medication Sig Dispense Refill  . atorvastatin (LIPITOR) 10 MG tablet Take 1 tablet (10 mg total) by mouth daily at 6 PM. 30 tablet 11  . calcium citrate-vitamin D (CITRACAL+D) 315-200 MG-UNIT per tablet Take 2 tablets by mouth 2 (two) times daily.    . Cholecalciferol (VITAMIN D3) 1000 UNITS CAPS Take 1,000 Units by mouth daily.     . COMBIVENT RESPIMAT 20-100 MCG/ACT AERS respimat Inhale 1 puff into the lungs as needed. 4 g 5  . dicyclomine (BENTYL) 20 MG tablet TAKE 1 TABLET (20 MG TOTAL) BY MOUTH EVERY 6 (SIX) HOURS AS NEEDED FOR SPASMS. 30 tablet 2  . docusate sodium (COLACE) 100 MG capsule Take 100 mg by mouth daily.    Marland Kitchen ELIQUIS 5 MG TABS tablet TAKE 1 TABLET (5 MG TOTAL) BY MOUTH 2 (TWO) TIMES DAILY. 60 tablet 5  . fexofenadine (ALLEGRA) 180 MG tablet Take 180 mg by mouth daily. Pt takes daily    . furosemide (LASIX) 40 MG tablet Take 1 tablet (40 mg total) by mouth daily. 90 tablet 1  . Glucosamine HCl-MSM (GLUCOSAMINE-MSM PO) Take by mouth. 1500/1500 Take 3 Capsules w/ meal of choice.    Marland Kitchen latanoprost (XALATAN) 0.005 % ophthalmic solution Place 1 drop into both eyes daily.      Marland Kitchen losartan (COZAAR) 25 MG tablet TAKE 1 TABLET (25 MG TOTAL) BY MOUTH DAILY. 90 tablet 3  . metoprolol tartrate (LOPRESSOR)  25 MG tablet Take 0.5 tablets (12.5 mg total) by mouth 2 (two) times daily. 90 tablet 3  . montelukast (SINGULAIR) 10 MG tablet Take 1 tablet (10 mg total) by mouth at bedtime. 90 tablet 2  . Multiple Vitamin (MULITIVITAMIN WITH MINERALS) TABS Take 1 tablet by mouth daily.    Marland Kitchen omeprazole (PRILOSEC) 20 MG capsule Take 1 capsule (20 mg total) by mouth daily. 90 capsule 3  . traMADol (ULTRAM) 50 MG tablet Take 1 tablet (50 mg total) by mouth every 6 (six) hours as needed. Dr.Ramos (Patient taking differently: Take 50 mg by mouth every 6 (six) hours as needed. Dr.Ramos; pt takes 1 tablet every  night, maybe 1 during the day as needed) 30 tablet 0   No current facility-administered medications on file prior to visit.     Past Medical History:  Diagnosis Date  . Ankle fracture, left 11/12/2011  . Atrial fibrillation ([place])    Post op after repair of ankle fracture  . 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 CAD (coronary artery disease) 03/2015   LAD 80%, otherwise minimal CAD. s/p LIMA-LAD  . Diverticulitis 2002   based on CT scan  . Diverticulosis    Dr Henrene Pastor  . DVT (deep venous thrombosis) ([place]) 2000   no trigger  . Endometriosis   . Gastritis 2006   @ Endo  . GERD (gastroesophageal reflux disease)   . Hiatal hernia   . Hyperlipidemia   . Hypertension   . IBS (irritable bowel syndrome)   . Ocular hypertension    glaucoma suspect, Dr. Kathrin Penner  . Skin cancer   . Superficial phlebitis     X 2, 1999, 2000  . Syncope 11/12/11   in context CAP . Carotid Doppler exam was negative. 2D echocardiogram revealed mild dilation of the left atrium and moderate increase in the systolic pressureof  the pulmonary artery  . UTI (lower urinary tract infection) 01/2013   Strep bovis ; PCN sensitive    Past Surgical History:  Procedure Laterality Date  . CARDIAC CATHETERIZATION N/A 03/28/2015   Procedure: Left Heart Cath and Coronary Angiography;  Surgeon: Jolaine Artist, MD;  Location: [facility];  Service: Cardiovascular;  Laterality: N/A;  . CATARACT EXTRACTION Bilateral   . CATARACT EXTRACTION W/ INTRAOCULAR LENS IMPLANT     OS; Dr Kathrin Penner  . COLONOSCOPY  1995, 2002, 2012   diverticulosis; Dr Henrene Pastor  . CORONARY ARTERY BYPASS GRAFT N/A 04/01/2015   Procedure: CORONARY ARTERY BYPASS GRAFTING (CABG), ON PUMP, TIMES ONE, USING LEFT INTERNAL MAMMARY ARTERY;  Surgeon: Ivin Poot, MD;  Location: [facility];  Service: Open Heart Surgery;  Laterality: N/A;  . EXCISION OF ATRIAL MYXOMA N/A 04/01/2015   Procedure: EXCISION OF ATRIAL  MYXOMA;  Surgeon: Ivin Poot, MD;  Location: [facility];  Service: Open Heart Surgery;  Laterality: N/A;  . ORIF ANKLE FRACTURE  11/14/2011   Procedure: OPEN REDUCTION INTERNAL FIXATION (ORIF) ANKLE FRACTURE;  Surgeon: Valerie Rhein;  Location: [facility];  Service: Orthopedics;  Laterality: Left;  open reduction internal fixation trimalleolar ankle fracture  . TEE WITHOUT CARDIOVERSION N/A 04/01/2015   Procedure: TRANSESOPHAGEAL ECHOCARDIOGRAM (TEE);  Surgeon: Ivin Poot, MD;  Location: [facility];  Service: Open Heart Surgery;  Laterality: N/A;  . TONSILLECTOMY    . TOTAL ABDOMINAL HYSTERECTOMY W/ BILATERAL SALPINGOOPHORECTOMY  1980   dysfunctional menses, endometriosis  . VARICOSE VEIN SURGERY  1960, 1965, 2009    Social History   Social History  . Marital status: Married    Spouse name: N/A  . Number of children: 2  . Years of education: N/A   Occupational History  . Retired Retired   Social History Main Topics  . Smoking status: Never Smoker  . Smokeless tobacco: Never Used  . Alcohol use No  . Drug use: No  . Sexual activity: Not Asked   Other Topics Concern  . None   Social History Narrative   REG EXERCISE   HAD COLONOSCOPY AND ENDOSCOPY DONE DATES UNKNOWN    Family History  Problem Relation Age of Onset  . Heart attack Father 76  . Alzheimer's disease Mother 50    TIAs  . Colon cancer Maternal Grandmother   . Heart attack Paternal Grandmother 82  . Hyperlipidemia Brother   . Hypertension Brother   . Diverticulitis Daughter     S/P colectomy  . Diabetes Neg Hx   . Stroke Neg Hx     Review of Systems  Constitutional: Negative for chills and fever.  Eyes: Negative for visual disturbance.  Respiratory: Positive for shortness of breath (with exertion sometimes) and wheezing (occ with exertion, especially in cold air). Negative for cough.   Cardiovascular: Positive for palpitations and leg swelling. Negative for chest pain.  Gastrointestinal: Negative for  abdominal pain, blood in stool, constipation, diarrhea and nausea.       No gerd  Genitourinary: Negative for dysuria and hematuria.  Musculoskeletal: Positive for arthralgias (right knee) and back pain (chronic - with activity).  Skin: Positive for color change (change on nose - rough area, slight redness). Negative for rash.  Neurological: Negative for light-headedness and headaches.  Psychiatric/Behavioral: Negative for dysphoric mood. The patient is not nervous/anxious.        Objective:   Vitals:   12/31/16 1525  BP: (!) 158/82  Pulse: 80  Resp: 16  Temp: 98 F (36.7 C)   Filed Weights   12/31/16 1525  Weight: 180 lb (81.6 kg)   Body mass index is 28.19 kg/m.  Wt Readings from Last 3 Encounters:  12/31/16 180 lb (81.6 kg)  11/29/16 180 lb (81.6 kg)  08/27/16 178 lb (80.7 kg)     Physical Exam Constitutional: She appears well-developed and well-nourished. No distress.  HENT:  Head: Normocephalic and atraumatic.  Right Ear: External ear normal. Normal ear canal and TM Left Ear: External ear normal.  Normal ear canal and TM Mouth/Throat: Oropharynx is clear and moist.  Eyes: Conjunctivae and EOM are normal.  Neck: Neck supple. No tracheal deviation present. No thyromegaly present.  No carotid bruit  Cardiovascular: Tachycardia, irregular rhythm.     2+ bilateral lower extremity edema. Pulmonary/Chest: Effort normal and breath sounds normal. No respiratory distress. She has no wheezes. She has no rales.  Breast: deferred to Gyn Abdominal: Soft. She exhibits no distension. There is no tenderness.  Lymphadenopathy: She has no cervical adenopathy.  Skin: Skin is warm and dry. She is not diaphoretic.  Psychiatric: She has a normal mood and affect. Her behavior is normal.         Assessment & Plan:   Physical exam: Screening blood work   ordered  Immunizations  Up to date  Colonoscopy - no longer needed based on age 33 - Up-to-date Gyn    Up to date    Dexa   Up to date  Eye exams  Up to date  - Q  6 months EKG  - done by cardiology - last 2017 Exercise   Usually walks -- not doing right now -- will restart Weight - work on weight loss Skin   No concerns Substance abuse   none  See Problem List for Assessment and Plan of chronic medical problems.  FU in 6 months

## 2016-12-31 NOTE — Assessment & Plan Note (Signed)
GERD controlled Continue daily medication  

## 2016-12-31 NOTE — Assessment & Plan Note (Signed)
Currently in atrial fibrillation, no evidence of heart failure Increase metoprolol back to 25 mg twice daily Continue Eliquis Check labs next week Follow-up with Dr. Stanford Breed or myself in the next couple of weeks

## 2016-12-31 NOTE — Assessment & Plan Note (Signed)
Check a1c Low sugar / carb diet Stressed regular exercise, keeping weight down/weight loss 

## 2016-12-31 NOTE — Patient Instructions (Addendum)
Test(s) ordered today. Your results will be released to [facility] (or called to you) after review, usually within 72hours after test completion. If any changes need to be made, you will be notified at that same time.  All other Health Maintenance issues reviewed.   All recommended immunizations and age-appropriate screenings are up-to-date or discussed.  No immunizations administered today.   A prolia injection was given.  Medications reviewed and updated.  Changes include increasing metoprolol 25 mg twice a day.  Take an extra lasix  Tomorrow-  You will take one in the morning and one in the evening (tomorrow only).   Your prescription(s) have been submitted to your pharmacy. Please take as directed and contact our office if you believe you are having problem(s) with the medication(s).    Please followup in 6 months   Health Maintenance, Female Introduction Adopting a healthy lifestyle and getting preventive care can go a long way to promote health and wellness. Talk with your health care provider about what schedule of regular examinations is right for you. This is a good chance for you to check in with your provider about disease prevention and staying healthy. In between checkups, there are plenty of things you can do on your own. Experts have done a lot of research about which lifestyle changes and preventive measures are most likely to keep you healthy. Ask your health care provider for more information. Weight and diet Eat a healthy diet  Be sure to include plenty of vegetables, fruits, low-fat dairy products, and lean protein.  Do not eat a lot of foods high in solid fats, added sugars, or salt.  Get regular exercise. This is one of the most important things you can do for your health.  Most adults should exercise for at least 150 minutes each week. The exercise should increase your heart rate and make you sweat (moderate-intensity exercise).  Most adults should also do  strengthening exercises at least twice a week. This is in addition to the moderate-intensity exercise. Maintain a healthy weight  Body mass index (BMI) is a measurement that can be used to identify possible weight problems. It estimates body fat based on height and weight. Your health care provider can help determine your BMI and help you achieve or maintain a healthy weight.  For females 70 years of age and older:  A BMI below 18.5 is considered underweight.  A BMI of 18.5 to 24.9 is normal.  A BMI of 25 to 29.9 is considered overweight.  A BMI of 30 and above is considered obese. Watch levels of cholesterol and blood lipids  You should start having your blood tested for lipids and cholesterol at 79 years of age, then have this test every 5 years.  You may need to have your cholesterol levels checked more often if:  Your lipid or cholesterol levels are high.  You are older than 79 years of age.  You are at high risk for heart disease. Cancer screening Lung Cancer  Lung cancer screening is recommended for adults 13-78 years old who are at high risk for lung cancer because of a history of smoking.  A yearly low-dose CT scan of the lungs is recommended for people who:  Currently smoke.  Have quit within the past 15 years.  Have at least a 30-pack-year history of smoking. A pack year is smoking an average of one pack of cigarettes a day for 1 year.  Yearly screening should continue until it has been 15  years since you quit.  Yearly screening should stop if you develop a health problem that would prevent you from having lung cancer treatment. Breast Cancer  Practice breast self-awareness. This means understanding how your breasts normally appear and feel.  It also means doing regular breast self-exams. Let your health care provider know about any changes, no matter how small.  If you are in your 20s or 30s, you should have a clinical breast exam (CBE) by a health care  provider every 1-3 years as part of a regular health exam.  If you are 36 or older, have a CBE every year. Also consider having a breast X-ray (mammogram) every year.  If you have a family history of breast cancer, talk to your health care provider about genetic screening.  If you are at high risk for breast cancer, talk to your health care provider about having an MRI and a mammogram every year.  Breast cancer gene (BRCA) assessment is recommended for women who have family members with BRCA-related cancers. BRCA-related cancers include:  Breast.  Ovarian.  Tubal.  Peritoneal cancers.  Results of the assessment will determine the need for genetic counseling and BRCA1 and BRCA2 testing. Cervical Cancer  Your health care provider may recommend that you be screened regularly for cancer of the pelvic organs (ovaries, uterus, and vagina). This screening involves a pelvic examination, including checking for microscopic changes to the surface of your cervix (Pap test). You may be encouraged to have this screening done every 3 years, beginning at age 67.  For women ages 30-65, health care providers may recommend pelvic exams and Pap testing every 3 years, or they may recommend the Pap and pelvic exam, combined with testing for human papilloma virus (HPV), every 5 years. Some types of HPV increase your risk of cervical cancer. Testing for HPV may also be done on women of any age with unclear Pap test results.  Other health care providers may not recommend any screening for nonpregnant women who are considered low risk for pelvic cancer and who do not have symptoms. Ask your health care provider if a screening pelvic exam is right for you.  If you have had past treatment for cervical cancer or a condition that could lead to cancer, you need Pap tests and screening for cancer for at least 20 years after your treatment. If Pap tests have been discontinued, your risk factors (such as having a new sexual  partner) need to be reassessed to determine if screening should resume. Some women have medical problems that increase the chance of getting cervical cancer. In these cases, your health care provider may recommend more frequent screening and Pap tests. Colorectal Cancer  This type of cancer can be detected and often prevented.  Routine colorectal cancer screening usually begins at 79 years of age and continues through 79 years of age.  Your health care provider may recommend screening at an earlier age if you have risk factors for colon cancer.  Your health care provider may also recommend using home test kits to check for hidden blood in the stool.  A small camera at the end of a tube can be used to examine your colon directly (sigmoidoscopy or colonoscopy). This is done to check for the earliest forms of colorectal cancer.  Routine screening usually begins at age 65.  Direct examination of the colon should be repeated every 5-10 years through 79 years of age. However, you may need to be screened more often if early  forms of precancerous polyps or small growths are found. Skin Cancer  Check your skin from head to toe regularly.  Tell your health care provider about any new moles or changes in moles, especially if there is a change in a mole's shape or color.  Also tell your health care provider if you have a mole that is larger than the size of a pencil eraser.  Always use sunscreen. Apply sunscreen liberally and repeatedly throughout the day.  Protect yourself by wearing long sleeves, pants, a wide-brimmed hat, and sunglasses whenever you are outside. Heart disease, diabetes, and high blood pressure  High blood pressure causes heart disease and increases the risk of stroke. High blood pressure is more likely to develop in:  People who have blood pressure in the high end of the normal range (130-139/85-89 mm Hg).  People who are overweight or obese.  People who are African  American.  If you are 61-33 years of age, have your blood pressure checked every 3-5 years. If you are 41 years of age or older, have your blood pressure checked every year. You should have your blood pressure measured twice-once when you are at a hospital or clinic, and once when you are not at a hospital or clinic. Record the average of the two measurements. To check your blood pressure when you are not at a hospital or clinic, you can use:  An automated blood pressure machine at a pharmacy.  A home blood pressure monitor.  If you are between 69 years and 29 years old, ask your health care provider if you should take aspirin to prevent strokes.  Have regular diabetes screenings. This involves taking a blood sample to check your fasting blood sugar level.  If you are at a normal weight and have a low risk for diabetes, have this test once every three years after 79 years of age.  If you are overweight and have a high risk for diabetes, consider being tested at a younger age or more often. Preventing infection Hepatitis B  If you have a higher risk for hepatitis B, you should be screened for this virus. You are considered at high risk for hepatitis B if:  You were born in a country where hepatitis B is common. Ask your health care provider which countries are considered high risk.  Your parents were born in a high-risk country, and you have not been immunized against hepatitis B (hepatitis B vaccine).  You have HIV or AIDS.  You use needles to inject street drugs.  You live with someone who has hepatitis B.  You have had sex with someone who has hepatitis B.  You get hemodialysis treatment.  You take certain medicines for conditions, including cancer, organ transplantation, and autoimmune conditions. Hepatitis C  Blood testing is recommended for:  Everyone born from [birth year] through 1963.  Anyone with known risk factors for hepatitis C. Sexually transmitted infections  (STIs)  You should be screened for sexually transmitted infections (STIs) including gonorrhea and chlamydia if:  You are sexually active and are younger than 79 years of age.  You are older than 79 years of age and your health care provider tells you that you are at risk for this type of infection.  Your sexual activity has changed since you were last screened and you are at an increased risk for chlamydia or gonorrhea. Ask your health care provider if you are at risk.  If you do not have HIV, but are at risk,  it may be recommended that you take a prescription medicine daily to prevent HIV infection. This is called pre-exposure prophylaxis (PrEP). You are considered at risk if:  You are sexually active and do not regularly use condoms or know the HIV status of your partner(s).  You take drugs by injection.  You are sexually active with a partner who has HIV. Talk with your health care provider about whether you are at high risk of being infected with HIV. If you choose to begin PrEP, you should first be tested for HIV. You should then be tested every 3 months for as long as you are taking PrEP. Pregnancy  If you are premenopausal and you may become pregnant, ask your health care provider about preconception counseling.  If you may become pregnant, take 400 to 800 micrograms (mcg) of folic acid every day.  If you want to prevent pregnancy, talk to your health care provider about birth control (contraception). Osteoporosis and menopause  Osteoporosis is a disease in which the bones lose minerals and strength with aging. This can result in serious bone fractures. Your risk for osteoporosis can be identified using a bone density scan.  If you are 34 years of age or older, or if you are at risk for osteoporosis and fractures, ask your health care provider if you should be screened.  Ask your health care provider whether you should take a calcium or vitamin D supplement to lower your risk  for osteoporosis.  Menopause may have certain physical symptoms and risks.  Hormone replacement therapy may reduce some of these symptoms and risks. Talk to your health care provider about whether hormone replacement therapy is right for you. Follow these instructions at home:  Schedule regular health, dental, and eye exams.  Stay current with your immunizations.  Do not use any tobacco products including cigarettes, chewing tobacco, or electronic cigarettes.  If you are pregnant, do not drink alcohol.  If you are breastfeeding, limit how much and how often you drink alcohol.  Limit alcohol intake to no more than 1 drink per day for nonpregnant women. One drink equals 12 ounces of beer, 5 ounces of wine, or 1 ounces of hard liquor.  Do not use street drugs.  Do not share needles.  Ask your health care provider for help if you need support or information about quitting drugs.  Tell your health care provider if you often feel depressed.  Tell your health care provider if you have ever been abused or do not feel safe at home. This information is not intended to replace advice given to you by your health care provider. Make sure you discuss any questions you have with your health care provider. Document Released: 05/24/2011 Document Revised: 04/15/2016 Document Reviewed: 08/12/2015  2017 [employer]

## 2016-12-31 NOTE — Assessment & Plan Note (Signed)
Taking calcium and vitamin D-continued Stressed getting back to walking regularly Prolia today-she understands the co-pay may be higher than estimated and she is okay with this

## 2016-12-31 NOTE — Progress Notes (Signed)
Pre visit review using our clinic review tool, if applicable. No additional management support is needed unless otherwise documented below in the visit note. 

## 2016-12-31 NOTE — Assessment & Plan Note (Signed)
Acute on chronic Lasix 40 mg daily is not controlling her edema-possibly because she is in A. fib Advised her to take 1 extra dose of Lasix tomorrow then resume 40 mg daily Metoprolol increased to control A. fib Blood work next week

## 2017-01-03 ENCOUNTER — Encounter: Payer: Self-pay | Admitting: Cardiology

## 2017-01-03 ENCOUNTER — Other Ambulatory Visit (INDEPENDENT_AMBULATORY_CARE_PROVIDER_SITE_OTHER): Payer: PPO

## 2017-01-03 ENCOUNTER — Telehealth: Payer: Self-pay | Admitting: *Deleted

## 2017-01-03 DIAGNOSIS — M8580 Other specified disorders of bone density and structure, unspecified site: Secondary | ICD-10-CM

## 2017-01-03 DIAGNOSIS — I10 Essential (primary) hypertension: Secondary | ICD-10-CM | POA: Diagnosis not present

## 2017-01-03 DIAGNOSIS — Z0000 Encounter for general adult medical examination without abnormal findings: Secondary | ICD-10-CM

## 2017-01-03 DIAGNOSIS — R7303 Prediabetes: Secondary | ICD-10-CM

## 2017-01-03 LAB — CBC WITH DIFFERENTIAL/PLATELET
BASOS ABS: 0.1 10*3/uL (ref 0.0–0.1)
BASOS PCT: 0.8 % (ref 0.0–3.0)
EOS ABS: 0.1 10*3/uL (ref 0.0–0.7)
Eosinophils Relative: 1.9 % (ref 0.0–5.0)
HEMATOCRIT: 44.4 % (ref 36.0–46.0)
HEMOGLOBIN: 15 g/dL (ref 12.0–15.0)
LYMPHS PCT: 23.6 % (ref 12.0–46.0)
Lymphs Abs: 1.9 10*3/uL (ref 0.7–4.0)
MCHC: 33.8 g/dL (ref 30.0–36.0)
MCV: 88.8 fl (ref 78.0–100.0)
Monocytes Absolute: 0.7 10*3/uL (ref 0.1–1.0)
Monocytes Relative: 8.4 % (ref 3.0–12.0)
Neutro Abs: 5.1 10*3/uL (ref 1.4–7.7)
Neutrophils Relative %: 65.3 % (ref 43.0–77.0)
PLATELETS: 235 10*3/uL (ref 150.0–400.0)
RBC: 5 Mil/uL (ref 3.87–5.11)
RDW: 13.2 % (ref 11.5–15.5)
WBC: 7.8 10*3/uL (ref 4.0–10.5)

## 2017-01-03 LAB — COMPREHENSIVE METABOLIC PANEL
ALK PHOS: 74 U/L (ref 39–117)
ALT: 34 U/L (ref 0–35)
AST: 32 U/L (ref 0–37)
Albumin: 4.7 g/dL (ref 3.5–5.2)
BILIRUBIN TOTAL: 0.8 mg/dL (ref 0.2–1.2)
BUN: 22 mg/dL (ref 6–23)
CALCIUM: 9.6 mg/dL (ref 8.4–10.5)
CO2: 28 meq/L (ref 19–32)
CREATININE: 1.06 mg/dL (ref 0.40–1.20)
Chloride: 105 mEq/L (ref 96–112)
GFR: 53.23 mL/min — AB (ref >60.00)
Glucose, Bld: 106 mg/dL — ABNORMAL HIGH (ref 70–99)
Potassium: 4 mEq/L (ref 3.5–5.1)
Sodium: 143 mEq/L (ref 135–145)
Total Protein: 7.2 g/dL (ref 6.0–8.3)

## 2017-01-03 LAB — LIPID PANEL
CHOLESTEROL: 152 mg/dL (ref 0–200)
HDL: 78.6 mg/dL (ref >39.00)
LDL CALC: 55 mg/dL (ref 0–99)
NonHDL: 73.38
TRIGLYCERIDES: 92 mg/dL (ref 0.0–149.0)
Total CHOL/HDL Ratio: 2
VLDL: 18.4 mg/dL (ref 0.0–40.0)

## 2017-01-03 LAB — HEMOGLOBIN A1C: HEMOGLOBIN A1C: 5.9 % (ref 4.6–6.5)

## 2017-01-03 LAB — VITAMIN D 25 HYDROXY (VIT D DEFICIENCY, FRACTURES): VITD: 38.58 ng/mL (ref 30.00–100.00)

## 2017-01-03 LAB — TSH: TSH: 2.85 u[IU]/mL (ref 0.35–4.50)

## 2017-01-03 NOTE — Telephone Encounter (Signed)
Spoke with pt, she has not noticed increased heart rate since the increase in the metoprolol. She reports a lose of 6 lbs since last Wednesday. No SOB and the edema is gone. bp today was 108/80, she is unable to count her pulse. She will continue on her current medications and she was given the okay to take an extra furosemide as needed for increased edema.

## 2017-01-03 NOTE — Telephone Encounter (Signed)
This encounter was created in error - please disregard.

## 2017-01-03 NOTE — Telephone Encounter (Signed)
Follow up       Debra call her about changing her medication ,

## 2017-01-03 NOTE — Telephone Encounter (Signed)
Left message for pt to call.

## 2017-01-03 NOTE — Telephone Encounter (Signed)
-----   Message from Lelon Perla, MD sent at 01/01/2017  3:35 PM EST ----- Thx, we will check with her Monday AM Valerie Rollins  ----- Message ----- From: Binnie Rail, MD Sent: 12/31/2016   5:10 PM To: Lelon Perla, MD  Valerie Rollins was in for a physical today.  She was in Afib with RVR.  Her legs were more swollen than usual.  I increased her metoprolol back up to 25 mg BID and had her take one extra lasix tomorrow.  She will start monitoring her BP and HR at home.  She wanted me to update you.    Valerie Rollins

## 2017-01-05 ENCOUNTER — Encounter: Payer: Self-pay | Admitting: Emergency Medicine

## 2017-01-12 ENCOUNTER — Other Ambulatory Visit: Payer: Self-pay | Admitting: Internal Medicine

## 2017-01-15 ENCOUNTER — Other Ambulatory Visit: Payer: Self-pay | Admitting: Cardiology

## 2017-02-21 ENCOUNTER — Telehealth: Payer: Self-pay | Admitting: Cardiology

## 2017-02-21 NOTE — Telephone Encounter (Signed)
Patient had an appt to see Dr. Stanford Breed on 03-01-17, however it had to rescheduled due to Dr. Stanford Breed quarter day. Patient did not want to take appt with PA and would like to know If she can be worked in on Dr. Jacalyn Lefevre schedule. Thanks.

## 2017-02-23 NOTE — Telephone Encounter (Signed)
Spoke with pt, Follow up scheduled  

## 2017-02-23 NOTE — Telephone Encounter (Signed)
Returning your call. °

## 2017-02-23 NOTE — Telephone Encounter (Signed)
Left message for pt to call.

## 2017-02-24 NOTE — Progress Notes (Signed)
HPI: FU CAD and atrial myxoma resection. Echo 4/16 orderded by primary care for cardiomegaly; this revealed normal LV function, left atrial mass; mild MR. Cardiac cath 5/16 showed 80 LAD. Preop carotid dopplers 5/16 with no significant stenosis. Patient had CABG with LIMA to LAD, resection of atrial myxoma and LAA oversewn. Echo 7/16 showed normal LV function, grade 2 diastolic dysfunction, mild MR, mild biatrial enlargement and moderate TR. Patient did have postoperative atrial fibrillation treated with amiodarone. Patient had recurrent atrial fibrillation in September 2016. She has been treated with rate controlling medications and anticoagulation. Echocardiogram September 2017 showed normal LV systolic function, mild mitral regurgitation, mild left atrial enlargement, moderate tricuspid regurgitation and moderately elevated pulmonary pressures. No recurrent myxoma. Since last seen, she occasionally has dyspnea relieved with inhalers but denies orthopnea, PND, chest pain, palpitations or syncope. Mild chronic pedal edema.   Current Outpatient Prescriptions  Medication Sig Dispense Refill  . atorvastatin (LIPITOR) 10 MG tablet Take 1 tablet (10 mg total) by mouth daily at 6 PM. 30 tablet 11  . calcium citrate-vitamin D (CITRACAL+D) 315-200 MG-UNIT per tablet Take 2 tablets by mouth 2 (two) times daily.    . Cholecalciferol (VITAMIN D3) 1000 UNITS CAPS Take 1,000 Units by mouth daily.     . COMBIVENT RESPIMAT 20-100 MCG/ACT AERS respimat Inhale 1 puff into the lungs as needed. 4 g 5  . dicyclomine (BENTYL) 20 MG tablet TAKE 1 TABLET (20 MG TOTAL) BY MOUTH EVERY 6 (SIX) HOURS AS NEEDED FOR SPASMS. 30 tablet 2  . docusate sodium (COLACE) 100 MG capsule Take 100 mg by mouth daily.    Marland Kitchen ELIQUIS 5 MG TABS tablet TAKE 1 TABLET (5 MG TOTAL) BY MOUTH 2 (TWO) TIMES DAILY. 60 tablet 5  . fexofenadine (ALLEGRA) 180 MG tablet Take 180 mg by mouth daily. Pt takes daily    . furosemide (LASIX) 40 MG tablet  TAKE 1 TABLET (40 MG TOTAL) BY MOUTH DAILY. 90 tablet 1  . Glucosamine HCl-MSM (GLUCOSAMINE-MSM PO) Take by mouth. 1500/1500 Take 3 Capsules w/ meal of choice.    Marland Kitchen latanoprost (XALATAN) 0.005 % ophthalmic solution Place 1 drop into both eyes daily.      Marland Kitchen losartan (COZAAR) 25 MG tablet TAKE 1 TABLET (25 MG TOTAL) BY MOUTH DAILY. 90 tablet 3  . metoprolol tartrate (LOPRESSOR) 25 MG tablet Take 1 tablet (25 mg total) by mouth 2 (two) times daily. 180 tablet 3  . montelukast (SINGULAIR) 10 MG tablet Take 1 tablet (10 mg total) by mouth at bedtime. 90 tablet 2  . Multiple Vitamin (MULITIVITAMIN WITH MINERALS) TABS Take 1 tablet by mouth daily. (Patient taking differently: Take 2 tablets by mouth daily. )    . omeprazole (PRILOSEC) 20 MG capsule Take 1 capsule (20 mg total) by mouth daily. 90 capsule 3  . traMADol (ULTRAM) 50 MG tablet Take 1 tablet (50 mg total) by mouth every 6 (six) hours as needed. Dr.Ramos (Patient taking differently: Take 50 mg by mouth every 6 (six) hours as needed. Dr.Ramos; pt takes 1 tablet every night, maybe 1 during the day as needed) 30 tablet 0   No current facility-administered medications for this visit.      Past Medical History:  Diagnosis Date  . Ankle fracture, left 11/12/2011  . Atrial fibrillation ([place])    Post op after repair of ankle fracture  . 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 CAD (  coronary artery disease) 03/2015   LAD 80%, otherwise minimal CAD. s/p LIMA-LAD  . Diverticulitis 2002   based on CT scan  . Diverticulosis    Dr Henrene Pastor  . DVT (deep venous thrombosis) ([place]) 2000   no trigger  . Endometriosis   . Gastritis 2006   @ End[facility]  . GERD (gastroesophageal reflux disease)   . Hiatal hernia   . Hyperlipidemia   . Hypertension   . IBS (irritable bowel syndrome)   . Ocular hypertension    glaucoma suspect, Dr. Kathrin Penner  . Skin cancer   . Superficial phlebitis     X 2, 1999, 2000  . Syncope 11/12/11    in context CAP . Carotid Doppler exam was negative. 2D echocardiogram revealed mild dilation of the left atrium and moderate increase in the systolic pressureof  the pulmonary artery  . UTI (lower urinary tract infection) 01/2013   Strep bovis ; PCN sensitive    Past Surgical History:  Procedure Laterality Date  . CARDIAC CATHETERIZATION N/A 03/28/2015   Procedure: Left Heart Cath and Coronary Angiography;  Surgeon: Jolaine Artist, MD;  Location: [facility];  Service: Cardiovascular;  Laterality: N/A;  . CATARACT EXTRACTION Bilateral   . CATARACT EXTRACTION W/ INTRAOCULAR LENS IMPLANT     OS; Dr Kathrin Penner  . COLONOSCOPY  1995, 2002, 2012   diverticulosis; Dr Henrene Pastor  . CORONARY ARTERY BYPASS GRAFT N/A 04/01/2015   Procedure: CORONARY ARTERY BYPASS GRAFTING (CABG), ON PUMP, TIMES ONE, USING LEFT INTERNAL MAMMARY ARTERY;  Surgeon: Ivin Poot, MD;  Location: [facility];  Service: Open Heart Surgery;  Laterality: N/A;  . EXCISION OF ATRIAL MYXOMA N/A 04/01/2015   Procedure: EXCISION OF ATRIAL MYXOMA;  Surgeon: Ivin Poot, MD;  Location: [facility];  Service: Open Heart Surgery;  Laterality: N/A;  . ORIF ANKLE FRACTURE  11/14/2011   Procedure: OPEN REDUCTION INTERNAL FIXATION (ORIF) ANKLE FRACTURE;  Surgeon: Colin Rhein;  [facility];  Service: Orthopedics;  Laterality: Left;  open reduction internal fixation trimalleolar ankle fracture  . TEE WITHOUT CARDIOVERSION N/A 04/01/2015   Procedure: TRANSESOPHAGEAL ECHOCARDIOGRAM (TEE);  Surgeon: Ivin Poot, MD;  Location: [facility];  Service: Open Heart Surgery;  Laterality: N/A;  . TONSILLECTOMY    . TOTAL ABDOMINAL HYSTERECTOMY W/ BILATERAL SALPINGOOPHORECTOMY  1980   dysfunctional menses, endometriosis  . [place], 2009    Social History   Social History  . Marital status: Married    Spouse name: N/A  . Number of children: 2  . Years of education: N/A   Occupational History  . Retired  Retired   Social History Main Topics  . Smoking status: Never Smoker  . Smokeless tobacco: Never Used  . Alcohol use No  . Drug use: No  . Sexual activity: Not on file   Other Topics Concern  . Not on file   Social History Narrative   REG EXERCISE   HAD COLONOSCOPY AND ENDOSCOPY DONE DATES UNKNOWN    Family History  Problem Relation Age of Onset  . Heart attack Father 65  . Alzheimer's disease Mother 26    TIAs  . Colon cancer Maternal Grandmother   . Heart attack Paternal Grandmother 82  . Hyperlipidemia Brother   . Hypertension Brother   . Diverticulitis Daughter     S/P colectomy  . Diabetes Neg Hx   . Stroke Neg Hx     ROS: no fevers  or chills, productive cough, hemoptysis, dysphasia, odynophagia, melena, hematochezia, dysuria, hematuria, rash, seizure activity, orthopnea, PND, claudication. Remaining systems are negative.  Physical Exam: Well-developed well-nourished in no acute distress.  Skin is warm and dry.  HEENT is normal.  Neck is supple.  Chest is clear to auscultation with normal expansion.  Cardiovascular exam is irregular Abdominal exam nontender or distended. No masses palpated. Extremities show trace to 1+ edema. neuro grossly intact  ECG- Atrial fibrillation at a rate of 96. Right bundle branch block. personally reviewed  A/P  1 Paroxysmal atrial fibrillation-patient back in atrial fibrillation. She is asymptomatic and I would favor rate control and anticoagulation. Increase metoprolol to 37.5 mg twice a day. Check Holter monitor in 24 hours to make sure that rate is controlled. Continue apixaban.  2 status post resection of atrial myxoma-no recurrence on most recent echo.  3 coronary artery disease-continue statin. No aspirin given need for anticoagulation.  4 hypertension-blood pressure controlled. Continue present meds  5 hyperlipidemia-continue statin.  6 chronic pedal edema-edema is controlled with present dose of Lasix.   Kirk Ruths, MD

## 2017-02-28 ENCOUNTER — Ambulatory Visit (INDEPENDENT_AMBULATORY_CARE_PROVIDER_SITE_OTHER): Payer: PPO | Admitting: Cardiology

## 2017-02-28 ENCOUNTER — Encounter: Payer: Self-pay | Admitting: Cardiology

## 2017-02-28 VITALS — BP 124/96 | HR 96 | Ht 67.0 in | Wt 170.0 lb

## 2017-02-28 DIAGNOSIS — E7800 Pure hypercholesterolemia, unspecified: Secondary | ICD-10-CM | POA: Diagnosis not present

## 2017-02-28 DIAGNOSIS — I10 Essential (primary) hypertension: Secondary | ICD-10-CM | POA: Diagnosis not present

## 2017-02-28 DIAGNOSIS — I4891 Unspecified atrial fibrillation: Secondary | ICD-10-CM

## 2017-02-28 DIAGNOSIS — I2510 Atherosclerotic heart disease of native coronary artery without angina pectoris: Secondary | ICD-10-CM

## 2017-02-28 MED ORDER — METOPROLOL TARTRATE 25 MG PO TABS: 37.5000 mg | ORAL_TABLET | Freq: Two times a day (BID) | ORAL | 3 refills | Status: DC

## 2017-02-28 NOTE — Patient Instructions (Signed)
Medication Instructions:   INCREASE METOPROLOL TO 37.5 MG TWICE DAILY= 1 AND 1/2 OF THE 25 MG TABLET TWICE DAILY  Testing/Procedures:  Your physician has recommended that you wear a 24 HOUR holter monitor. Holter monitors are medical devices that record the heart's electrical activity. Doctors most often use these monitors to diagnose arrhythmias. Arrhythmias are problems with the speed or rhythm of the heartbeat. The monitor is a small, portable device. You can wear one while you do your normal daily activities. This is usually used to diagnose what is causing palpitations/syncope (passing out).    Follow-Up:  Your physician wants you to follow-up in: [place] will receive a reminder letter in the mail two months in advance. If you don't receive a letter, please call our office to schedule the follow-up appointment.   If you need a refill on your cardiac medications before your next appointment, please call your pharmacy.

## 2017-03-01 ENCOUNTER — Ambulatory Visit: Payer: PPO | Admitting: Cardiology

## 2017-03-02 ENCOUNTER — Ambulatory Visit: Payer: PPO | Admitting: Physician Assistant

## 2017-03-08 DIAGNOSIS — M545 Low back pain: Secondary | ICD-10-CM | POA: Diagnosis not present

## 2017-03-08 DIAGNOSIS — M62830 Muscle spasm of back: Secondary | ICD-10-CM | POA: Diagnosis not present

## 2017-03-08 DIAGNOSIS — M9901 Segmental and somatic dysfunction of cervical region: Secondary | ICD-10-CM | POA: Diagnosis not present

## 2017-03-08 DIAGNOSIS — M9903 Segmental and somatic dysfunction of lumbar region: Secondary | ICD-10-CM | POA: Diagnosis not present

## 2017-03-09 DIAGNOSIS — M62830 Muscle spasm of back: Secondary | ICD-10-CM | POA: Diagnosis not present

## 2017-03-09 DIAGNOSIS — M9903 Segmental and somatic dysfunction of lumbar region: Secondary | ICD-10-CM | POA: Diagnosis not present

## 2017-03-09 DIAGNOSIS — M545 Low back pain: Secondary | ICD-10-CM | POA: Diagnosis not present

## 2017-03-09 DIAGNOSIS — M9901 Segmental and somatic dysfunction of cervical region: Secondary | ICD-10-CM | POA: Diagnosis not present

## 2017-03-10 DIAGNOSIS — M545 Low back pain: Secondary | ICD-10-CM | POA: Diagnosis not present

## 2017-03-10 DIAGNOSIS — M9903 Segmental and somatic dysfunction of lumbar region: Secondary | ICD-10-CM | POA: Diagnosis not present

## 2017-03-10 DIAGNOSIS — M62830 Muscle spasm of back: Secondary | ICD-10-CM | POA: Diagnosis not present

## 2017-03-10 DIAGNOSIS — M9901 Segmental and somatic dysfunction of cervical region: Secondary | ICD-10-CM | POA: Diagnosis not present

## 2017-03-14 ENCOUNTER — Ambulatory Visit (INDEPENDENT_AMBULATORY_CARE_PROVIDER_SITE_OTHER): Payer: PPO

## 2017-03-14 DIAGNOSIS — M9903 Segmental and somatic dysfunction of lumbar region: Secondary | ICD-10-CM | POA: Diagnosis not present

## 2017-03-14 DIAGNOSIS — M9901 Segmental and somatic dysfunction of cervical region: Secondary | ICD-10-CM | POA: Diagnosis not present

## 2017-03-14 DIAGNOSIS — I4891 Unspecified atrial fibrillation: Secondary | ICD-10-CM

## 2017-03-14 DIAGNOSIS — M62830 Muscle spasm of back: Secondary | ICD-10-CM | POA: Diagnosis not present

## 2017-03-14 DIAGNOSIS — M545 Low back pain: Secondary | ICD-10-CM | POA: Diagnosis not present

## 2017-03-16 DIAGNOSIS — M62830 Muscle spasm of back: Secondary | ICD-10-CM | POA: Diagnosis not present

## 2017-03-16 DIAGNOSIS — M9901 Segmental and somatic dysfunction of cervical region: Secondary | ICD-10-CM | POA: Diagnosis not present

## 2017-03-16 DIAGNOSIS — M9903 Segmental and somatic dysfunction of lumbar region: Secondary | ICD-10-CM | POA: Diagnosis not present

## 2017-03-16 DIAGNOSIS — M545 Low back pain: Secondary | ICD-10-CM | POA: Diagnosis not present

## 2017-03-17 DIAGNOSIS — M9901 Segmental and somatic dysfunction of cervical region: Secondary | ICD-10-CM | POA: Diagnosis not present

## 2017-03-17 DIAGNOSIS — M9903 Segmental and somatic dysfunction of lumbar region: Secondary | ICD-10-CM | POA: Diagnosis not present

## 2017-03-17 DIAGNOSIS — M545 Low back pain: Secondary | ICD-10-CM | POA: Diagnosis not present

## 2017-03-17 DIAGNOSIS — M62830 Muscle spasm of back: Secondary | ICD-10-CM | POA: Diagnosis not present

## 2017-03-27 ENCOUNTER — Other Ambulatory Visit: Payer: Self-pay | Admitting: Internal Medicine

## 2017-03-28 ENCOUNTER — Telehealth: Payer: Self-pay | Admitting: Cardiology

## 2017-03-28 ENCOUNTER — Other Ambulatory Visit: Payer: Self-pay | Admitting: Cardiology

## 2017-03-28 MED ORDER — ATORVASTATIN CALCIUM 10 MG PO TABS
10.0000 mg | ORAL_TABLET | Freq: Every day | ORAL | 3 refills | Status: DC
Start: 2017-03-28 — End: 2018-03-19

## 2017-03-28 NOTE — Telephone Encounter (Signed)
Medication Detail    Disp Refills Start End   atorvastatin (LIPITOR) 10 MG tablet 30 tablet 11 06/03/2016    Sig - Route: Take 1 tablet (10 mg total) by mouth daily at 6 PM. - Oral   E-Prescribing Status: Receipt confirmed by pharmacy (06/03/2016 4:25 PM EDT)   Pharmacy   [id number] - [place], [address]

## 2017-03-28 NOTE — Telephone Encounter (Signed)
Called pharmacy - patient's Rx for atorvastatin was denied. Authorized refill over phone. Patient notified.

## 2017-03-28 NOTE — Telephone Encounter (Signed)
Patient calling, states that she received a notice from her pharmacy that her atorvastatin medication was not authorized and patient is calling to get an update on medication. Thanks.

## 2017-04-18 ENCOUNTER — Other Ambulatory Visit: Payer: Self-pay | Admitting: Cardiology

## 2017-04-20 ENCOUNTER — Other Ambulatory Visit: Payer: Self-pay | Admitting: Cardiology

## 2017-04-20 NOTE — Telephone Encounter (Signed)
Rx has been sent to the pharmacy electronically. ° °

## 2017-04-21 DIAGNOSIS — H01001 Unspecified blepharitis right upper eyelid: Secondary | ICD-10-CM | POA: Diagnosis not present

## 2017-04-21 DIAGNOSIS — H401131 Primary open-angle glaucoma, bilateral, mild stage: Secondary | ICD-10-CM | POA: Diagnosis not present

## 2017-04-21 DIAGNOSIS — H16103 Unspecified superficial keratitis, bilateral: Secondary | ICD-10-CM | POA: Diagnosis not present

## 2017-04-21 DIAGNOSIS — H04123 Dry eye syndrome of bilateral lacrimal glands: Secondary | ICD-10-CM | POA: Diagnosis not present

## 2017-05-30 DIAGNOSIS — M48061 Spinal stenosis, lumbar region without neurogenic claudication: Secondary | ICD-10-CM | POA: Diagnosis not present

## 2017-05-30 DIAGNOSIS — G894 Chronic pain syndrome: Secondary | ICD-10-CM | POA: Diagnosis not present

## 2017-05-30 DIAGNOSIS — M5136 Other intervertebral disc degeneration, lumbar region: Secondary | ICD-10-CM | POA: Diagnosis not present

## 2017-06-29 ENCOUNTER — Ambulatory Visit (INDEPENDENT_AMBULATORY_CARE_PROVIDER_SITE_OTHER): Payer: PPO | Admitting: Internal Medicine

## 2017-06-29 ENCOUNTER — Encounter: Payer: Self-pay | Admitting: Internal Medicine

## 2017-06-29 VITALS — BP 116/86 | HR 98 | Temp 97.9°F | Resp 16 | Wt 166.0 lb

## 2017-06-29 DIAGNOSIS — R7303 Prediabetes: Secondary | ICD-10-CM

## 2017-06-29 DIAGNOSIS — K219 Gastro-esophageal reflux disease without esophagitis: Secondary | ICD-10-CM | POA: Diagnosis not present

## 2017-06-29 DIAGNOSIS — I10 Essential (primary) hypertension: Secondary | ICD-10-CM

## 2017-06-29 DIAGNOSIS — E7800 Pure hypercholesterolemia, unspecified: Secondary | ICD-10-CM

## 2017-06-29 MED ORDER — ZOSTER VAC RECOMB ADJUVANTED 50 MCG/0.5ML IM SUSR: 0.5000 mL | Freq: Once | INTRAMUSCULAR | 1 refills | Status: AC

## 2017-06-29 MED ORDER — RANITIDINE HCL 150 MG PO TABS: 150.0000 mg | ORAL_TABLET | Freq: Two times a day (BID) | ORAL | 1 refills | Status: DC

## 2017-06-29 NOTE — Progress Notes (Signed)
Subjective:    Patient ID: Valerie Rollins, female    DOB: 09-29-1936, 81 y.o.   MRN: [id number]  HPI The patient is here for follow up.  CAD, Afib, Hypertension: She is taking her medication daily. She is compliant with a low sodium diet.  She denies chest pain, palpitations, edema, shortness of breath and regular headaches. She is not exercising regularly.     Hyperlipidemia: She is taking her medication daily. She is compliant with a low fat/cholesterol diet. She is not exercising regularly. She denies myalgias.   GERD:  She is taking her medication daily as prescribed. She is experiencing a dry cough and hoarseness for the past several months.  She denies GERD.    Prediabetes:  She is compliant with a low sugar/carbohydrate diet.  She is not exercising regularly.  She has lost weight.     Medications and allergies reviewed with patient and updated if appropriate.  Patient Active Problem List   Diagnosis Date Noted  . Bilateral leg edema 12/31/2016  . Prediabetes 01/10/2016  . Osteopenia 12/26/2015  . Mass of neck 07/07/2015  . Encounter for therapeutic drug monitoring 05/14/2015  . Long-term (current) use of anticoagulants 04/16/2015  . S/P CABG x 1 04/01/2015  . CAD (coronary artery disease) 03/28/2015  . 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 Cardiomegaly 02/20/2015  . Hypotension, postural 06/07/2013  . DDD (degenerative disc disease), lumbar 07/20/2012  . Atrial fibrillation ([place]) 11/15/2011  . Syncope 11/12/2011  . Hypokalemia 11/12/2011  . RBBB (right bundle branch block) 11/12/2011  . HTN (hypertension), malignant 11/12/2011  . HOARSENESS 02/23/2010  . SKIN CANCER, HX OF 11/18/2009  . Diverticulosis of large intestine 10/31/2008  . HEART MURMUR, SYSTOLIC 27/78/2423  . VARICOSE VEINS, LOWER EXTREMITIES 03/13/2008  . GERD 02/20/2008  . IBS 02/20/2008  . OSTEOARTHRITIS 02/20/2008  . Hyperlipidemia 12/01/2006  . Essential hypertension 12/01/2006  . SUPERFICIAL  THROMBOPHLEBITIS 12/01/2006  . DVT, HX OF 12/01/2006    Current Outpatient Prescriptions on File Prior to Visit  Medication Sig Dispense Refill  . atorvastatin (LIPITOR) 10 MG tablet Take 1 tablet (10 mg total) by mouth daily at 6 PM. 90 tablet 3  . calcium citrate-vitamin D (CITRACAL+D) 315-200 MG-UNIT per tablet Take 2 tablets by mouth 2 (two) times daily.    . Cholecalciferol (VITAMIN D3) 1000 UNITS CAPS Take 1,000 Units by mouth daily.     . COMBIVENT RESPIMAT 20-100 MCG/ACT AERS respimat Inhale 1 puff into the lungs as needed. 4 g 5  . dicyclomine (BENTYL) 20 MG tablet TAKE 1 TABLET (20 MG TOTAL) BY MOUTH EVERY 6 (SIX) HOURS AS NEEDED FOR SPASMS. 30 tablet 2  . ELIQUIS 5 MG TABS tablet TAKE 1 TABLET (5 MG TOTAL) BY MOUTH 2 (TWO) TIMES DAILY. 60 tablet 5  . fexofenadine (ALLEGRA) 180 MG tablet Take 180 mg by mouth daily. Pt takes daily    . furosemide (LASIX) 40 MG tablet TAKE 1 TABLET (40 MG TOTAL) BY MOUTH DAILY. 90 tablet 1  . Glucosamine HCl-MSM (GLUCOSAMINE-MSM PO) Take by mouth. 1500/1500 Take 3 Capsules w/ meal of choice.    Marland Kitchen latanoprost (XALATAN) 0.005 % ophthalmic solution Place 1 drop into both eyes daily.      Marland Kitchen losartan (COZAAR) 25 MG tablet TAKE 1 TABLET (25 MG TOTAL) BY MOUTH DAILY. 90 tablet 3  . metoprolol tartrate (LOPRESSOR) 25 MG tablet TAKE 1 AND 1/2 TABLET BY MOUTH TWICE A DAY 270 tablet 3  . montelukast (SINGULAIR) 10 MG  tablet TAKE 1 TABLET (10 MG TOTAL) BY MOUTH AT BEDTIME. 90 tablet 2  . Multiple Vitamin (MULITIVITAMIN WITH MINERALS) TABS Take 1 tablet by mouth daily. (Patient taking differently: Take 2 tablets by mouth daily. )    . omeprazole (PRILOSEC) 20 MG capsule Take 1 capsule (20 mg total) by mouth daily. 90 capsule 3  . traMADol (ULTRAM) 50 MG tablet Take 1 tablet (50 mg total) by mouth every 6 (six) hours as needed. Dr.Ramos (Patient taking differently: Take 50 mg by mouth every 6 (six) hours as needed. Dr.Ramos; pt takes 1 tablet every night, maybe 1  during the day as needed) 30 tablet 0   No current facility-administered medications on file prior to visit.     Past Medical History:  Diagnosis Date  . Ankle fracture, left 11/12/2011  . Atrial fibrillation ([place])    Post op after repair of ankle fracture  . 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 CAD (coronary artery disease) 03/2015   LAD 80%, otherwise minimal CAD. s/p LIMA-LAD  . Diverticulitis 2002   based on CT scan  . Diverticulosis    Dr Henrene Pastor  . DVT (deep venous thrombosis) ([place]) 2000   no trigger  . Endometriosis   . Gastritis 2006   @ Endo  . GERD (gastroesophageal reflux disease)   . Hiatal hernia   . Hyperlipidemia   . Hypertension   . IBS (irritable bowel syndrome)   . Ocular hypertension    glaucoma suspect, Dr. Kathrin Penner  . Skin cancer   . Superficial phlebitis     X 2, 1999, 2000  . Syncope 11/12/11   in context CAP . Carotid Doppler exam was negative. 2D echocardiogram revealed mild dilation of the left atrium and moderate increase in the systolic pressureof  the pulmonary artery  . UTI (lower urinary tract infection) 01/2013   Strep bovis ; PCN sensitive    Past Surgical History:  Procedure Laterality Date  . CARDIAC CATHETERIZATION N/A 03/28/2015   Procedure: Left Heart Cath and Coronary Angiography;  Surgeon: Jolaine Artist, MD;  Location: [facility] INVASIVE CV LAB [facility]UP[facility];  Service: Cardiovascular;  Laterality: N/A;  . CATARACT EXTRACTION Bilateral   . CATARACT EXTRACTION W/ INTRAOCULAR LENS IMPLANT     OS; Dr Kathrin Penner  . COLONOSCOPY  1995, 2002, 2012   diverticulosis; Dr Henrene Pastor  . CORONARY ARTERY BYPASS GRAFT N/A 04/01/2015   Procedure: CORONARY ARTERY BYPASS GRAFTING (CABG), ON PUMP, TIMES ONE, USING LEFT INTERNAL MAMMARY ARTERY;  Surgeon: Ivin Poot, MD;  Location: [facility];  Service: Open Heart Surgery;  Laterality: N/A;  . EXCISION OF ATRIAL MYXOMA N/A 04/01/2015   Procedure: EXCISION OF ATRIAL MYXOMA;   Surgeon: Ivin Poot, MD;  Location: [facility];  Service: Open Heart Surgery;  Laterality: N/A;  . ORIF ANKLE FRACTURE  11/14/2011   Procedure: OPEN REDUCTION INTERNAL FIXATION (ORIF) ANKLE FRACTURE;  Surgeon: Valerie Rhein;  [facility];  Service: Orthopedics;  Laterality: Left;  open reduction internal fixation trimalleolar ankle fracture  . TEE WITHOUT CARDIOVERSION N/A 04/01/2015   Procedure: TRANSESOPHAGEAL ECHOCARDIOGRAM (TEE);  Surgeon: Ivin Poot, MD;  Location: [facility];  Service: Open Heart Surgery;  Laterality: N/A;  . TONSILLECTOMY    . TOTAL ABDOMINAL HYSTERECTOMY W/ BILATERAL SALPINGOOPHORECTOMY  1980   dysfunctional menses, endometriosis  . [place], 2009    Social History   Social History  .  Marital status: Married    Spouse name: N/A  . Number of children: 2  . Years of education: N/A   Occupational History  . Retired Retired   Social History Main Topics  . Smoking status: Never Smoker  . Smokeless tobacco: Never Used  . Alcohol use No  . Drug use: No  . Sexual activity: Not Asked   Other Topics Concern  . None   Social History Narrative   REG EXERCISE   HAD COLONOSCOPY AND ENDOSCOPY DONE DATES UNKNOWN    Family History  Problem Relation Age of Onset  . Heart attack Father 32  . Alzheimer's disease Mother 39       TIAs  . Colon cancer Maternal Grandmother   . Heart attack Paternal Grandmother 82  . Hyperlipidemia Brother   . Hypertension Brother   . Diverticulitis Daughter        S/P colectomy  . Diabetes Neg Hx   . Stroke Neg Hx     Review of Systems Constitutional: Negative for chills and fever.  HENT: Positive for voice change. Negative for trouble swallowing.   Respiratory: Positive for cough. Negative for shortness of breath and wheezing.   Cardiovascular: Positive for leg swelling. Negative for chest pain and palpitations.  Gastrointestinal: Negative for abdominal pain and nausea.  Neurological:  Negative for light-headedness and headaches.     Objective:   Vitals:   06/29/17 1409  BP: 116/86  Pulse: 98  Resp: 16  Temp: 97.9 F (36.6 C)   Wt Readings from Last 3 Encounters:  06/29/17 166 lb (75.3 kg)  02/28/17 170 lb (77.1 kg)  12/31/16 180 lb (81.6 kg)   Body mass index is 26 kg/m.   Physical Exam    Constitutional: Appears well-developed and well-nourished. No distress.  HENT:  Head: Normocephalic and atraumatic.  Neck: Neck supple. No tracheal deviation present. No thyromegaly present.  No cervical lymphadenopathy Cardiovascular: Normal rate, regular rhythm and normal heart sounds.   No murmur heard. No carotid bruit .  Mild b/l non pitting LE edema with varicose veins b/l Pulmonary/Chest: Effort normal and breath sounds normal. No respiratory distress. No has no wheezes. No rales.  Skin: Skin is warm and dry. Not diaphoretic.  Psychiatric: Normal mood and affect. Behavior is normal.      Assessment & Plan:    See Problem List for Assessment and Plan of chronic medical problems.

## 2017-06-29 NOTE — Patient Instructions (Addendum)
  Test(s) ordered today. Your results will be released to [facility] (or called to you) after review, usually within 72hours after test completion. If any changes need to be made, you will be notified at that same time.  A prescription for the new shingles vaccines was sent to your pharmacy.   Medications reviewed and updated.  Changes include starting zantac at night and/or the morning for your heartburn.   Your prescription(s) have been submitted to your pharmacy. Please take as directed and contact our office if you believe you are having problem(s) with the medication(s).  You are eligible for your prolia injection after 8/10.  Have blood work done that day.   Please followup in 6 months

## 2017-06-29 NOTE — Assessment & Plan Note (Addendum)
Not controlled - having a cough and hoarseness Take zantac 1-2 times daily Continue omeprazole Call if symptoms do not improve

## 2017-06-29 NOTE — Assessment & Plan Note (Signed)
BP Readings from Last 3 Encounters:  06/29/17 116/86  02/28/17 (!) 124/96  12/31/16 (!) 158/82   BP well controlled Current regimen effective and well tolerated Continue current medications at current doses

## 2017-06-29 NOTE — Assessment & Plan Note (Signed)
Check lipid panel  Continue daily statin Regular exercise and healthy diet encouraged  

## 2017-06-29 NOTE — Assessment & Plan Note (Signed)
Check a1c Low sugar / carb diet Stressed regular exercise   

## 2017-06-30 ENCOUNTER — Telehealth: Payer: Self-pay

## 2017-06-30 NOTE — Telephone Encounter (Signed)
Patient advised that insurance has been verified for prolia injections---estimated $220 copay due---can get next prolia injection either on/after 07/01/17---advised by dr burns in office visit---ok to give per Leianna Barga---patient to call back to schedule appt

## 2017-07-03 ENCOUNTER — Other Ambulatory Visit: Payer: Self-pay | Admitting: Internal Medicine

## 2017-07-06 ENCOUNTER — Other Ambulatory Visit (INDEPENDENT_AMBULATORY_CARE_PROVIDER_SITE_OTHER): Payer: PPO

## 2017-07-06 ENCOUNTER — Ambulatory Visit (INDEPENDENT_AMBULATORY_CARE_PROVIDER_SITE_OTHER): Payer: PPO

## 2017-07-06 DIAGNOSIS — E7800 Pure hypercholesterolemia, unspecified: Secondary | ICD-10-CM | POA: Diagnosis not present

## 2017-07-06 DIAGNOSIS — I10 Essential (primary) hypertension: Secondary | ICD-10-CM

## 2017-07-06 DIAGNOSIS — M8580 Other specified disorders of bone density and structure, unspecified site: Secondary | ICD-10-CM

## 2017-07-06 DIAGNOSIS — R7303 Prediabetes: Secondary | ICD-10-CM | POA: Diagnosis not present

## 2017-07-06 LAB — COMPREHENSIVE METABOLIC PANEL
ALBUMIN: 4.7 g/dL (ref 3.5–5.2)
ALK PHOS: 54 U/L (ref 39–117)
ALT: 28 U/L (ref 0–35)
AST: 33 U/L (ref 0–37)
BILIRUBIN TOTAL: 0.8 mg/dL (ref 0.2–1.2)
BUN: 31 mg/dL — AB (ref 6–23)
CO2: 31 mEq/L (ref 19–32)
CREATININE: 0.97 mg/dL (ref 0.40–1.20)
Calcium: 10.2 mg/dL (ref 8.4–10.5)
Chloride: 103 mEq/L (ref 96–112)
GFR: 58.89 mL/min — ABNORMAL LOW (ref >60.00)
Glucose, Bld: 90 mg/dL (ref 70–99)
POTASSIUM: 4.3 meq/L (ref 3.5–5.1)
SODIUM: 141 meq/L (ref 135–145)
TOTAL PROTEIN: 6.7 g/dL (ref 6.0–8.3)

## 2017-07-06 LAB — CBC WITH DIFFERENTIAL/PLATELET
BASOS ABS: 0.1 10*3/uL (ref 0.0–0.1)
Basophils Relative: 0.9 % (ref 0.0–3.0)
EOS PCT: 2.6 % (ref 0.0–5.0)
Eosinophils Absolute: 0.1 10*3/uL (ref 0.0–0.7)
HCT: 45.5 % (ref 36.0–46.0)
HEMOGLOBIN: 15.4 g/dL — AB (ref 12.0–15.0)
Lymphocytes Relative: 34.9 % (ref 12.0–46.0)
Lymphs Abs: 2 10*3/uL (ref 0.7–4.0)
MCHC: 33.8 g/dL (ref 30.0–36.0)
MCV: 91.2 fl (ref 78.0–100.0)
MONO ABS: 0.6 10*3/uL (ref 0.1–1.0)
MONOS PCT: 11 % (ref 3.0–12.0)
Neutro Abs: 2.9 10*3/uL (ref 1.4–7.7)
Neutrophils Relative %: 50.6 % (ref 43.0–77.0)
Platelets: 204 10*3/uL (ref 150.0–400.0)
RBC: 5 Mil/uL (ref 3.87–5.11)
RDW: 12.5 % (ref 11.5–15.5)
WBC: 5.7 10*3/uL (ref 4.0–10.5)

## 2017-07-06 LAB — HEMOGLOBIN A1C: HEMOGLOBIN A1C: 5.7 % (ref 4.6–6.5)

## 2017-07-06 MED ORDER — DENOSUMAB 60 MG/ML ~~LOC~~ SOLN
60.0000 mg | Freq: Once | SUBCUTANEOUS | Status: AC
  Administered 2017-07-06: 60 mg via SUBCUTANEOUS

## 2017-07-06 NOTE — Progress Notes (Signed)
prolia Injection given.   Matelyn Antonelli J Addyson Traub, MD  

## 2017-07-07 ENCOUNTER — Encounter: Payer: Self-pay | Admitting: Emergency Medicine

## 2017-07-19 ENCOUNTER — Other Ambulatory Visit: Payer: Self-pay | Admitting: Cardiology

## 2017-08-01 DIAGNOSIS — M545 Low back pain: Secondary | ICD-10-CM | POA: Diagnosis not present

## 2017-08-01 DIAGNOSIS — M62830 Muscle spasm of back: Secondary | ICD-10-CM | POA: Diagnosis not present

## 2017-08-01 DIAGNOSIS — M9903 Segmental and somatic dysfunction of lumbar region: Secondary | ICD-10-CM | POA: Diagnosis not present

## 2017-08-01 DIAGNOSIS — M9901 Segmental and somatic dysfunction of cervical region: Secondary | ICD-10-CM | POA: Diagnosis not present

## 2017-08-03 DIAGNOSIS — M545 Low back pain: Secondary | ICD-10-CM | POA: Diagnosis not present

## 2017-08-03 DIAGNOSIS — M9901 Segmental and somatic dysfunction of cervical region: Secondary | ICD-10-CM | POA: Diagnosis not present

## 2017-08-03 DIAGNOSIS — M62830 Muscle spasm of back: Secondary | ICD-10-CM | POA: Diagnosis not present

## 2017-08-03 DIAGNOSIS — M9903 Segmental and somatic dysfunction of lumbar region: Secondary | ICD-10-CM | POA: Diagnosis not present

## 2017-08-04 DIAGNOSIS — M545 Low back pain: Secondary | ICD-10-CM | POA: Diagnosis not present

## 2017-08-04 DIAGNOSIS — M9901 Segmental and somatic dysfunction of cervical region: Secondary | ICD-10-CM | POA: Diagnosis not present

## 2017-08-04 DIAGNOSIS — M9903 Segmental and somatic dysfunction of lumbar region: Secondary | ICD-10-CM | POA: Diagnosis not present

## 2017-08-04 DIAGNOSIS — M62830 Muscle spasm of back: Secondary | ICD-10-CM | POA: Diagnosis not present

## 2017-08-08 DIAGNOSIS — M9901 Segmental and somatic dysfunction of cervical region: Secondary | ICD-10-CM | POA: Diagnosis not present

## 2017-08-08 DIAGNOSIS — M62830 Muscle spasm of back: Secondary | ICD-10-CM | POA: Diagnosis not present

## 2017-08-08 DIAGNOSIS — M9903 Segmental and somatic dysfunction of lumbar region: Secondary | ICD-10-CM | POA: Diagnosis not present

## 2017-08-08 DIAGNOSIS — M545 Low back pain: Secondary | ICD-10-CM | POA: Diagnosis not present

## 2017-08-16 ENCOUNTER — Ambulatory Visit: Payer: PPO

## 2017-08-22 ENCOUNTER — Ambulatory Visit (INDEPENDENT_AMBULATORY_CARE_PROVIDER_SITE_OTHER): Payer: PPO

## 2017-08-22 DIAGNOSIS — Z23 Encounter for immunization: Secondary | ICD-10-CM | POA: Diagnosis not present

## 2017-08-29 NOTE — Progress Notes (Signed)
HPI: FU CAD and atrial myxoma resection. Echo 4/16 orderded by primary care for cardiomegaly; this revealed normal LV function, left atrial mass; mild MR. Cardiac cath 5/16 showed 80 LAD. Preop carotid dopplers 5/16 with no significant stenosis. Patient had CABG with LIMA to LAD, resection of atrial myxoma and LAA oversewn. Echo 7/16 showed normal LV function, grade 2 diastolic dysfunction, mild MR, mild biatrial enlargement and moderate TR. Patient did have postoperative atrial fibrillation treated with amiodarone. Patient had recurrent atrial fibrillation in September 2016. She has been treated with rate controlling medications and anticoagulation. EchocardiogramSeptember 2017 showed normal LV systolic function, mild mitral regurgitation, mild left atrial enlargement, moderate tricuspid regurgitation and moderately elevated pulmonary pressures. No recurrent myxoma. Holter 4/18 showed atrial fibrillation, rate mildly decreased late PM and early AM but otherwise controlled. Since last seen, she occasionally has some dyspnea but no orthopnea or PND. Chronic mild pedal edema. No chest pain, palpitations or syncope. No bleeding.  Current Outpatient Prescriptions  Medication Sig Dispense Refill  . atorvastatin (LIPITOR) 10 MG tablet Take 1 tablet (10 mg total) by mouth daily at 6 PM. 90 tablet 3  . calcium citrate-vitamin D (CITRACAL+D) 315-200 MG-UNIT per tablet Take 2 tablets by mouth 2 (two) times daily.    . Cholecalciferol (VITAMIN D3) 1000 UNITS CAPS Take 1,000 Units by mouth daily.     . Coenzyme Q10 (CO Q 10 PO) Take by mouth daily.    . COMBIVENT RESPIMAT 20-100 MCG/ACT AERS respimat Inhale 1 puff into the lungs as needed. 4 g 5  . dicyclomine (BENTYL) 20 MG tablet TAKE 1 TABLET (20 MG TOTAL) BY MOUTH EVERY 6 (SIX) HOURS AS NEEDED FOR SPASMS. 30 tablet 2  . ELIQUIS 5 MG TABS tablet TAKE 1 TABLET (5 MG TOTAL) BY MOUTH 2 (TWO) TIMES DAILY. 60 tablet 5  . fexofenadine (ALLEGRA) 180 MG tablet  Take 180 mg by mouth daily. Pt takes daily    . furosemide (LASIX) 40 MG tablet TAKE 1 TABLET (40 MG TOTAL) BY MOUTH DAILY. 90 tablet 1  . Glucosamine HCl-MSM (GLUCOSAMINE-MSM PO) Take by mouth. 1500/1500 Take 3 Capsules w/ meal of choice.    Marland Kitchen latanoprost (XALATAN) 0.005 % ophthalmic solution Place 1 drop into both eyes daily.      Marland Kitchen losartan (COZAAR) 25 MG tablet TAKE 1 TABLET (25 MG TOTAL) BY MOUTH DAILY. 90 tablet 3  . Magnesium 400 MG CAPS Take 400 mg by mouth every other day.     . metoprolol tartrate (LOPRESSOR) 25 MG tablet TAKE 1 AND 1/2 TABLET BY MOUTH TWICE A DAY 270 tablet 3  . montelukast (SINGULAIR) 10 MG tablet TAKE 1 TABLET (10 MG TOTAL) BY MOUTH AT BEDTIME. 90 tablet 2  . Multiple Vitamin (MULITIVITAMIN WITH MINERALS) TABS Take 1 tablet by mouth daily. (Patient taking differently: Take 2 tablets by mouth daily. )    . omeprazole (PRILOSEC) 20 MG capsule Take 1 capsule (20 mg total) by mouth daily. 90 capsule 3  . ranitidine (ZANTAC) 150 MG tablet Take 1 tablet (150 mg total) by mouth 2 (two) times daily. 180 tablet 1  . traMADol (ULTRAM) 50 MG tablet Take 1 tablet (50 mg total) by mouth every 6 (six) hours as needed. Dr.Ramos (Patient taking differently: Take 50 mg by mouth every 6 (six) hours as needed. Dr.Ramos; pt takes 1 tablet every night, maybe 1 during the day as needed) 30 tablet 0   No current facility-administered medications for this visit.  Past Medical History:  Diagnosis Date  . Ankle fracture, left 11/12/2011  . Atrial fibrillation ([place])    Post op after repair of ankle fracture  . 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 CAD (coronary artery disease) 03/2015   LAD 80%, otherwise minimal CAD. s/p LIMA-LAD  . Diverticulitis 2002   based on CT scan  . Diverticulosis    Dr Henrene Pastor  . DVT (deep venous thrombosis) ([place]) 2000   no trigger  . Endometriosis   . Gastritis 2006   @ Endo  . GERD (gastroesophageal reflux  disease)   . Hiatal hernia   . Hyperlipidemia   . Hypertension   . IBS (irritable bowel syndrome)   . Ocular hypertension    glaucoma suspect, Dr. Kathrin Penner  . Skin cancer   . Superficial phlebitis     X 2, 1999, 2000  . Syncope 11/12/11   in context CAP . Carotid Doppler exam was negative. 2D echocardiogram revealed mild dilation of the left atrium and moderate increase in the systolic pressureof  the pulmonary artery  . UTI (lower urinary tract infection) 01/2013   Strep bovis ; PCN sensitive    Past Surgical History:  Procedure Laterality Date  . CARDIAC CATHETERIZATION N/A 03/28/2015   Procedure: Left Heart Cath and Coronary Angiography;  Surgeon: Jolaine Artist, MD;  Location: [facility];  Service: Cardiovascular;  Laterality: N/A;  . CATARACT EXTRACTION Bilateral   . CATARACT EXTRACTION W/ INTRAOCULAR LENS IMPLANT     OS; Dr Kathrin Penner  . COLONOSCOPY  1995, 2002, 2012   diverticulosis; Dr Henrene Pastor  . CORONARY ARTERY BYPASS GRAFT N/A 04/01/2015   Procedure: CORONARY ARTERY BYPASS GRAFTING (CABG), ON PUMP, TIMES ONE, USING LEFT INTERNAL MAMMARY ARTERY;  Surgeon: Ivin Poot, MD;  Location: [facility];  Service: Open Heart Surgery;  Laterality: N/A;  . EXCISION OF ATRIAL MYXOMA N/A 04/01/2015   Procedure: EXCISION OF ATRIAL MYXOMA;  Surgeon: Ivin Poot, MD;  Location: [facility];  Service: Open Heart Surgery;  Laterality: N/A;  . ORIF ANKLE FRACTURE  11/14/2011   Procedure: OPEN REDUCTION INTERNAL FIXATION (ORIF) ANKLE FRACTURE;  Surgeon: Colin Rhein;  Location[facility];  Service: Orthopedics;  Laterality: Left;  open reduction internal fixation trimalleolar ankle fracture  . TEE WITHOUT CARDIOVERSION N/A 04/01/2015   Procedure: TRANSESOPHAGEAL ECHOCARDIOGRAM (TEE);  Surgeon: Ivin Poot, MD;  Location: [facility];  Service: Open Heart Surgery;  Laterality: N/A;  . TONSILLECTOMY    . TOTAL ABDOMINAL HYSTERECTOMY W/ BILATERAL SALPINGOOPHORECTOMY  1980   dysfunctional  menses, endometriosis  . [place], 2009    Social History   Social History  . Marital status: Married    Spouse name: N/A  . Number of children: 2  . Years of education: N/A   Occupational History  . Retired Retired   Social History Main Topics  . Smoking status: Never Smoker  . Smokeless tobacco: Never Used  . Alcohol use No  . Drug use: No  . Sexual activity: Not on file   Other Topics Concern  . Not on file   Social History Narrative   REG EXERCISE   HAD COLONOSCOPY AND ENDOSCOPY DONE DATES UNKNOWN    Family History  Problem Relation Age of Onset  . Heart attack Father 36  . Alzheimer's disease Mother 25       TIAs  . Colon  cancer Maternal Grandmother   . Heart attack Paternal Grandmother 82  . Hyperlipidemia Brother   . Hypertension Brother   . Diverticulitis Daughter        S/P colectomy  . Diabetes Neg Hx   . Stroke Neg Hx     ROS: no fevers or chills, productive cough, hemoptysis, dysphasia, odynophagia, melena, hematochezia, dysuria, hematuria, rash, seizure activity, orthopnea, PND, claudication. Remaining systems are negative.  Physical Exam: Well-developed well-nourished in no acute distress.  Skin is warm and dry.  HEENT is normal.  Neck is supple.  Chest is clear to auscultation with normal expansion.  Cardiovascular exam is irregular Abdominal exam nontender or distended. No masses palpated. Extremities show varicosities and trace edema. neuro grossly intact  ECG- Atrial fibrillation at a rate of 106. Right bundle branch block. personally reviewed  A/P  1 Paroxysmal atrial fibrillation-we have elected to continue with rate control and anticoagulation. Continue metoprolol at present dose. Previous Holter monitor showed heart rate was controlled. Continue apixaban.  2 coronary artery disease-continue statin. No aspirin given need for anticoagulation.  3 hypertension-blood pressure is controlled. Continue  present medications.  4 hyperlipidemia-continue statin. Check lipids and liver.  5 prior resection of atrial myxoma- I will likely repeat her echocardiogram when she returns in one year.  6 chronic pedal edema-well-controlled at present. Continue present dose of Lasix.    Kirk Ruths, MD

## 2017-09-12 ENCOUNTER — Ambulatory Visit (INDEPENDENT_AMBULATORY_CARE_PROVIDER_SITE_OTHER): Payer: PPO | Admitting: Cardiology

## 2017-09-12 ENCOUNTER — Encounter: Payer: Self-pay | Admitting: Cardiology

## 2017-09-12 VITALS — BP 136/80 | HR 106 | Ht 67.0 in | Wt 165.0 lb

## 2017-09-12 DIAGNOSIS — E7800 Pure hypercholesterolemia, unspecified: Secondary | ICD-10-CM

## 2017-09-12 DIAGNOSIS — I2510 Atherosclerotic heart disease of native coronary artery without angina pectoris: Secondary | ICD-10-CM

## 2017-09-12 DIAGNOSIS — I4891 Unspecified atrial fibrillation: Secondary | ICD-10-CM

## 2017-09-12 DIAGNOSIS — D151 Benign neoplasm of heart: Secondary | ICD-10-CM | POA: Diagnosis not present

## 2017-09-12 DIAGNOSIS — I10 Essential (primary) hypertension: Secondary | ICD-10-CM | POA: Diagnosis not present

## 2017-09-12 NOTE — Patient Instructions (Signed)
Medication Instructions:   NO CHANGE  Labwork:  Your physician recommends that you return for lab work WHEN FASTING  Follow-Up:  Your physician wants you to follow-up in: 6 MONTHS WITH DR CRENSHAW You will receive a reminder letter in the mail two months in advance. If you don't receive a letter, please call our office to schedule the follow-up appointment.   If you need a refill on your cardiac medications before your next appointment, please call your pharmacy.  

## 2017-09-13 DIAGNOSIS — I2510 Atherosclerotic heart disease of native coronary artery without angina pectoris: Secondary | ICD-10-CM | POA: Diagnosis not present

## 2017-09-13 LAB — LIPID PANEL
CHOL/HDL RATIO: 1.7 ratio (ref 0.0–4.4)
Cholesterol, Total: 145 mg/dL (ref 100–199)
HDL: 83 mg/dL (ref >39)
LDL CALC: 51 mg/dL (ref 0–99)
Triglycerides: 53 mg/dL (ref 0–149)
VLDL CHOLESTEROL CAL: 11 mg/dL (ref 5–40)

## 2017-09-13 LAB — HEPATIC FUNCTION PANEL
ALBUMIN: 4.5 g/dL (ref 3.5–4.8)
ALT: 27 IU/L (ref 0–32)
AST: 33 IU/L (ref 0–40)
Alkaline Phosphatase: 57 IU/L (ref 39–117)
Bilirubin Total: 0.7 mg/dL (ref 0.0–1.2)
Bilirubin, Direct: 0.22 mg/dL (ref 0.00–0.40)
TOTAL PROTEIN: 6.4 g/dL (ref 6.0–8.5)

## 2017-09-14 ENCOUNTER — Encounter: Payer: Self-pay | Admitting: *Deleted

## 2017-09-17 ENCOUNTER — Other Ambulatory Visit: Payer: Self-pay | Admitting: Cardiology

## 2017-10-24 DIAGNOSIS — H0100A Unspecified blepharitis right eye, upper and lower eyelids: Secondary | ICD-10-CM | POA: Diagnosis not present

## 2017-10-24 DIAGNOSIS — H0100B Unspecified blepharitis left eye, upper and lower eyelids: Secondary | ICD-10-CM | POA: Diagnosis not present

## 2017-10-24 DIAGNOSIS — H04123 Dry eye syndrome of bilateral lacrimal glands: Secondary | ICD-10-CM | POA: Diagnosis not present

## 2017-10-24 DIAGNOSIS — H401131 Primary open-angle glaucoma, bilateral, mild stage: Secondary | ICD-10-CM | POA: Diagnosis not present

## 2017-10-26 DIAGNOSIS — M62830 Muscle spasm of back: Secondary | ICD-10-CM | POA: Diagnosis not present

## 2017-10-26 DIAGNOSIS — M9903 Segmental and somatic dysfunction of lumbar region: Secondary | ICD-10-CM | POA: Diagnosis not present

## 2017-10-26 DIAGNOSIS — M545 Low back pain: Secondary | ICD-10-CM | POA: Diagnosis not present

## 2017-10-26 DIAGNOSIS — M9901 Segmental and somatic dysfunction of cervical region: Secondary | ICD-10-CM | POA: Diagnosis not present

## 2017-10-27 DIAGNOSIS — M9903 Segmental and somatic dysfunction of lumbar region: Secondary | ICD-10-CM | POA: Diagnosis not present

## 2017-10-27 DIAGNOSIS — M545 Low back pain: Secondary | ICD-10-CM | POA: Diagnosis not present

## 2017-10-27 DIAGNOSIS — M62830 Muscle spasm of back: Secondary | ICD-10-CM | POA: Diagnosis not present

## 2017-10-27 DIAGNOSIS — M9901 Segmental and somatic dysfunction of cervical region: Secondary | ICD-10-CM | POA: Diagnosis not present

## 2017-11-05 ENCOUNTER — Other Ambulatory Visit: Payer: Self-pay | Admitting: Internal Medicine

## 2017-11-21 DIAGNOSIS — M48061 Spinal stenosis, lumbar region without neurogenic claudication: Secondary | ICD-10-CM | POA: Diagnosis not present

## 2017-11-21 DIAGNOSIS — M5136 Other intervertebral disc degeneration, lumbar region: Secondary | ICD-10-CM | POA: Diagnosis not present

## 2017-12-31 ENCOUNTER — Other Ambulatory Visit: Payer: Self-pay | Admitting: Internal Medicine

## 2018-01-07 NOTE — Progress Notes (Signed)
Subjective:    Patient ID: Valerie Rollins, female    DOB: [birth year], 82 y.o.   MRN: [id number]  HPI The patient is here for follow up.  CAD, Afib, Hypertension: She is taking her medication daily. She is compliant with a low sodium diet.  She denies chest pain, palpitations, shortness of breath and regular headaches. She is exercising regularly.    Edema, b/l legs:  She is taking lasix 40 mg daily.   She thinks her edema has increased - maybe from being on her feet more.  She is compliant with a low sodium diet.  She is walking regularly.  She has severe varicose veins in both legs and has had two surgeries in the past.  She has tried compression socks in the past and did not like them.   Hyperlipidemia: She is taking her medication daily. She is compliant with a low fat/cholesterol diet. She is exercising regularly. She denies myalgias.   Prediabetes:  She is compliant with a low sugar/carbohydrate diet.  She is exercising regularly.  GERD:  She is taking her medication daily as prescribed.  She was taking zantac but stopped it because she had a sore throat all the time wile taking it.  She does experience cough, hoarseness at times and sometimes a sore throat.      Medications and allergies reviewed with patient and updated if appropriate.  Patient Active Problem List   Diagnosis Date Noted  . Bilateral leg edema 12/31/2016  . Prediabetes 01/10/2016  . Osteopenia 12/26/2015  . Mass of neck 07/07/2015  . Encounter for therapeutic drug monitoring 05/14/2015  . Long-term (current) use of anticoagulants 04/16/2015  . S/P CABG x 1 04/01/2015  . CAD (coronary artery disease) 03/28/2015  . 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 Cardiomegaly 02/20/2015  . Hypotension, postural 06/07/2013  . DDD (degenerative disc disease), lumbar 07/20/2012  . Atrial fibrillation ([place]) 11/15/2011  . Syncope 11/12/2011  . Hypokalemia 11/12/2011  . RBBB (right bundle branch block) 11/12/2011  . HTN  (hypertension), malignant 11/12/2011  . HOARSENESS 02/23/2010  . SKIN CANCER, HX OF 11/18/2009  . Diverticulosis of large intestine 10/31/2008  . HEART MURMUR, SYSTOLIC 41/66/0630  . VARICOSE VEINS, LOWER EXTREMITIES 03/13/2008  . GERD 02/20/2008  . IBS 02/20/2008  . OSTEOARTHRITIS 02/20/2008  . Hyperlipidemia 12/01/2006  . Essential hypertension 12/01/2006  . SUPERFICIAL THROMBOPHLEBITIS 12/01/2006  . DVT, HX OF 12/01/2006    Current Outpatient Medications on File Prior to Visit  Medication Sig Dispense Refill  . atorvastatin (LIPITOR) 10 MG tablet Take 1 tablet (10 mg total) by mouth daily at 6 PM. 90 tablet 3  . calcium citrate-vitamin D (CITRACAL+D) 315-200 MG-UNIT per tablet Take 2 tablets by mouth 2 (two) times daily.    . Cholecalciferol (VITAMIN D3) 1000 UNITS CAPS Take 1,000 Units by mouth daily.     . Coenzyme Q10 (CO Q 10 PO) Take by mouth daily.    . COMBIVENT RESPIMAT 20-100 MCG/ACT AERS respimat Inhale 1 puff into the lungs as needed. 4 g 5  . dicyclomine (BENTYL) 20 MG tablet TAKE 1 TABLET (20 MG TOTAL) BY MOUTH EVERY 6 (SIX) HOURS AS NEEDED FOR SPASMS. 30 tablet 2  . ELIQUIS 5 MG TABS tablet TAKE 1 TABLET (5 MG TOTAL) BY MOUTH 2 (TWO) TIMES DAILY. 60 tablet 5  . fexofenadine (ALLEGRA) 180 MG tablet Take 180 mg by mouth daily. Pt takes daily    . furosemide (LASIX) 40 MG tablet TAKE 1 TABLET (  40 MG TOTAL) BY MOUTH DAILY. 90 tablet 1  . latanoprost (XALATAN) 0.005 % ophthalmic solution Place 1 drop into both eyes daily.      Marland Kitchen losartan (COZAAR) 25 MG tablet TAKE 1 TABLET (25 MG TOTAL) BY MOUTH DAILY. 90 tablet 3  . Magnesium 400 MG CAPS Take 400 mg by mouth every other day.     . metoprolol tartrate (LOPRESSOR) 25 MG tablet TAKE 1 AND 1/2 TABLET BY MOUTH TWICE A DAY 270 tablet 3  . montelukast (SINGULAIR) 10 MG tablet TAKE 1 TABLET (10 MG TOTAL) BY MOUTH AT BEDTIME. 90 tablet 2  . Multiple Vitamin (MULITIVITAMIN WITH MINERALS) TABS Take 1 tablet by mouth daily. (Patient  taking differently: Take 2 tablets by mouth daily. )    . omeprazole (PRILOSEC) 20 MG capsule TAKE ONE CAPSULE BY MOUTH EVERY DAY 90 capsule 1  . ranitidine (ZANTAC) 150 MG tablet Take 1 tablet (150 mg total) by mouth 2 (two) times daily. (Patient not taking: Reported on 01/09/2018) 180 tablet 1  . traMADol (ULTRAM) 50 MG tablet Take 1 tablet (50 mg total) by mouth every 6 (six) hours as needed. Dr.Ramos (Patient taking differently: Take 50 mg by mouth every 6 (six) hours as needed. Dr.Ramos; pt takes 1 tablet every night, maybe 1 during the day as needed) 30 tablet 0   No current facility-administered medications on file prior to visit.     Past Medical History:  Diagnosis Date  . Ankle fracture, left 11/12/2011  . Atrial fibrillation ([place])    Post op after repair of ankle fracture  . 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 CAD (coronary artery disease) 03/2015   LAD 80%, otherwise minimal CAD. s/p LIMA-LAD  . Diverticulitis 2002   based on CT scan  . Diverticulosis    Dr Henrene Pastor  . DVT (deep venous thrombosis) ([place]) 2000   no trigger  . Endometriosis   . Gastritis 2006   @ Endo  . GERD (gastroesophageal reflux disease)   . Hiatal hernia   . Hyperlipidemia   . Hypertension   . IBS (irritable bowel syndrome)   . Ocular hypertension    glaucoma suspect, Dr. Kathrin Penner  . Skin cancer   . Superficial phlebitis     X 2, 1999, 2000  . Syncope 11/12/11   in context CAP . Carotid Doppler exam was negative. 2D echocardiogram revealed mild dilation of the left atrium and moderate increase in the systolic pressureof  the pulmonary artery  . UTI (lower urinary tract infection) 01/2013   Strep bovis ; PCN sensitive    Past Surgical History:  Procedure Laterality Date  . CARDIAC CATHETERIZATION N/A 03/28/2015   Procedure: Left Heart Cath and Coronary Angiography;  Surgeon: Jolaine Artist, MD;  Location: [facility];  Service:  Cardiovascular;  Laterality: N/A;  . CATARACT EXTRACTION Bilateral   . CATARACT EXTRACTION W/ INTRAOCULAR LENS IMPLANT     OS; Dr Kathrin Penner  . COLONOSCOPY  1995, 2002, 2012   diverticulosis; Dr Henrene Pastor  . CORONARY ARTERY BYPASS GRAFT N/A 04/01/2015   Procedure: CORONARY ARTERY BYPASS GRAFTING (CABG), ON PUMP, TIMES ONE, USING LEFT INTERNAL MAMMARY ARTERY;  Surgeon: Ivin Poot, MD;  Location: [facility];  Service: Open Heart Surgery;  Laterality: N/A;  . EXCISION OF ATRIAL MYXOMA N/A 04/01/2015   Procedure: EXCISION OF ATRIAL MYXOMA;  Surgeon: Ivin Poot, MD;  Location: [facility];  Service: Open  Heart Surgery;  Laterality: N/A;  . ORIF ANKLE FRACTURE  11/14/2011   Procedure: OPEN REDUCTION INTERNAL FIXATION (ORIF) ANKLE FRACTURE;  Surgeon: Valerie Rhein;  [facility];  Service: Orthopedics;  Laterality: Left;  open reduction internal fixation trimalleolar ankle fracture  . TEE WITHOUT CARDIOVERSION N/A 04/01/2015   Procedure: TRANSESOPHAGEAL ECHOCARDIOGRAM (TEE);  Surgeon: Ivin Poot, MD;  Location: [facility];  Service: Open Heart Surgery;  Laterality: N/A;  . TONSILLECTOMY    . TOTAL ABDOMINAL HYSTERECTOMY W/ BILATERAL SALPINGOOPHORECTOMY  1980   dysfunctional menses, endometriosis  . [place], 2009    Social History   Socioeconomic History  . Marital status: Married    Spouse name: None  . Number of children: 2  . Years of education: None  . Highest education level: None  Social Needs  . Financial resource strain: None  . Food insecurity - worry: None  . Food insecurity - inability: None  . Transportation needs - medical: None  . Transportation needs - non-medical: None  Occupational History  . Occupation: Retired    Fish farm manager: RETIRED  Tobacco Use  . Smoking status: Never Smoker  . Smokeless tobacco: Never Used  Substance and Sexual Activity  . Alcohol use: No  . Drug use: No  . Sexual activity: None  Other Topics Concern  . None    Social History Narrative   REG EXERCISE   HAD COLONOSCOPY AND ENDOSCOPY DONE DATES UNKNOWN    Family History  Problem Relation Age of Onset  . Heart attack Father 78  . Alzheimer's disease Mother 34       TIAs  . Colon cancer Maternal Grandmother   . Heart attack Paternal Grandmother 82  . Hyperlipidemia Brother   . Hypertension Brother   . Diverticulitis Daughter        S/P colectomy  . Diabetes Neg Hx   . Stroke Neg Hx     Review of Systems  Constitutional: Negative for chills and fever.  HENT: Positive for voice change.   Respiratory: Positive for cough and wheezing (occ). Negative for shortness of breath.   Cardiovascular: Positive for leg swelling (increased). Negative for chest pain and palpitations.  Neurological: Positive for headaches (occ). Negative for light-headedness.       Objective:   Vitals:   01/09/18 1332  BP: 130/84  Pulse: 76  Resp: 16  Temp: 97.7 F (36.5 C)  SpO2: 95%   Wt Readings from Last 3 Encounters:  01/09/18 165 lb (74.8 kg)  09/12/17 165 lb (74.8 kg)  06/29/17 166 lb (75.3 kg)   Body mass index is 25.84 kg/m.   Physical Exam    Constitutional: Appears well-developed and well-nourished. No distress.  HENT:  Head: Normocephalic and atraumatic.  Neck: Neck supple. No tracheal deviation present. No thyromegaly present.  No cervical lymphadenopathy Cardiovascular: Normal rate, irregular rhythm and normal heart sounds.   No murmur heard. No carotid bruit .  Moderate non pitting b/l LE edema, severe varicose veins b/l.   Pulmonary/Chest: Effort normal and breath sounds normal. No respiratory distress. No has no wheezes. No rales.  Skin: Skin is warm and dry. Not diaphoretic.  Psychiatric: Normal mood and affect. Behavior is normal.      Assessment & Plan:    See Problem List for Assessment and Plan of chronic medical problems.

## 2018-01-07 NOTE — Patient Instructions (Addendum)
You can take pepcid or tums/rolaids for heartburn symptoms.  Continue your daily omeprazole.   Test(s) ordered today. Your results will be released to [place] (or called to you) after review, usually within 72hours after test completion. If any changes need to be made, you will be notified at that same time.  All other Health Maintenance issues reviewed.   All recommended immunizations and age-appropriate screenings are up-to-date or discussed.  No immunizations administered today.   Medications reviewed and updated.  No changes recommended at this time.   Please followup in 6 months    Gastroesophageal Reflux Disease, Adult Normally, food travels down the esophagus and stays in the stomach to be digested. However, when a person has gastroesophageal reflux disease (GERD), food and stomach acid move back up into the esophagus. When this happens, the esophagus becomes sore and inflamed. Over time, GERD can create small holes (ulcers) in the lining of the esophagus. What are the causes? This condition is caused by a problem with the muscle between the esophagus and the stomach (lower esophageal sphincter, or LES). Normally, the LES muscle closes after food passes through the esophagus to the stomach. When the LES is weakened or abnormal, it does not close properly, and that allows food and stomach acid to go back up into the esophagus. The LES can be weakened by certain dietary substances, medicines, and medical conditions, including:  Tobacco use.  Pregnancy.  Having a hiatal hernia.  Heavy alcohol use.  Certain foods and beverages, such as coffee, chocolate, onions, and peppermint.  What increases the risk? This condition is more likely to develop in:  People who have an increased body weight.  People who have connective tissue disorders.  People who use NSAID medicines.  What are the signs or symptoms? Symptoms of this condition include:  Heartburn.  Difficult or painful  swallowing.  The feeling of having a lump in the throat.  Abitter taste in the mouth.  Bad breath.  Having a large amount of saliva.  Having an upset or bloated stomach.  Belching.  Chest pain.  Shortness of breath or wheezing.  Ongoing (chronic) cough or a night-time cough.  Wearing away of tooth enamel.  Weight loss.  Different conditions can cause chest pain. Make sure to see your health care provider if you experience chest pain. How is this diagnosed? Your health care provider will take a medical history and perform a physical exam. To determine if you have mild or severe GERD, your health care provider may also monitor how you respond to treatment. You may also have other tests, including:  An endoscopy toexamine your stomach and esophagus with a small camera.  A test thatmeasures the acidity level in your esophagus.  A test thatmeasures how much pressure is on your esophagus.  A barium swallow or modified barium swallow to show the shape, size, and functioning of your esophagus.  How is this treated? The goal of treatment is to help relieve your symptoms and to prevent complications. Treatment for this condition may vary depending on how severe your symptoms are. Your health care provider may recommend:  Changes to your diet.  Medicine.  Surgery.  Follow these instructions at home: Diet  Follow a diet as recommended by your health care provider. This may involve avoiding foods and drinks such as: ? Coffee and tea (with or without caffeine). ? Drinks that containalcohol. ? Energy drinks and sports drinks. ? Carbonated drinks or sodas. ? Chocolate and cocoa. ? Peppermint  and mint flavorings. ? Garlic and onions. ? Horseradish. ? Spicy and acidic foods, including peppers, chili powder, curry powder, vinegar, hot sauces, and barbecue sauce. ? Citrus fruit juices and citrus fruits, such as oranges, lemons, and limes. ? Tomato-based foods, such as red  sauce, chili, salsa, and pizza with red sauce. ? Fried and fatty foods, such as donuts, french fries, potato chips, and high-fat dressings. ? High-fat meats, such as hot dogs and fatty cuts of red and white meats, such as rib eye steak, sausage, ham, and bacon. ? High-fat dairy items, such as whole milk, butter, and cream cheese.  Eat small, frequent meals instead of large meals.  Avoid drinking large amounts of liquid with your meals.  Avoid eating meals during the 2-3 hours before bedtime.  Avoid lying down right after you eat.  Do not exercise right after you eat. General instructions  Pay attention to any changes in your symptoms.  Take over-the-counter and prescription medicines only as told by your health care provider. Do not take aspirin, ibuprofen, or other NSAIDs unless your health care provider told you to do so.  Do not use any tobacco products, including cigarettes, chewing tobacco, and e-cigarettes. If you need help quitting, ask your health care provider.  Wear loose-fitting clothing. Do not wear anything tight around your waist that causes pressure on your abdomen.  Raise (elevate) the head of your bed 6 inches (15cm).  Try to reduce your stress, such as with yoga or meditation. If you need help reducing stress, ask your health care provider.  If you are overweight, reduce your weight to an amount that is healthy for you. Ask your health care provider for guidance about a safe weight loss goal.  Keep all follow-up visits as told by your health care provider. This is important. Contact a health care provider if:  You have new symptoms.  You have unexplained weight loss.  You have difficulty swallowing, or it hurts to swallow.  You have wheezing or a persistent cough.  Your symptoms do not improve with treatment.  You have a hoarse voice. Get help right away if:  You have pain in your arms, neck, jaw, teeth, or back.  You feel sweaty, dizzy, or  light-headed.  You have chest pain or shortness of breath.  You vomit and your vomit looks like blood or coffee grounds.  You faint.  Your stool is bloody or black.  You cannot swallow, drink, or eat. This information is not intended to replace advice given to you by your health care provider. Make sure you discuss any questions you have with your health care provider. Document Released: 08/18/2005 Document Revised: 04/07/2016 Document Reviewed: 03/05/2015 Elsevier Interactive Patient Education  [employer].

## 2018-01-09 ENCOUNTER — Encounter: Payer: Self-pay | Admitting: Internal Medicine

## 2018-01-09 ENCOUNTER — Ambulatory Visit (INDEPENDENT_AMBULATORY_CARE_PROVIDER_SITE_OTHER): Payer: Medicare HMO | Admitting: *Deleted

## 2018-01-09 ENCOUNTER — Ambulatory Visit (INDEPENDENT_AMBULATORY_CARE_PROVIDER_SITE_OTHER): Payer: Medicare HMO | Admitting: Internal Medicine

## 2018-01-09 ENCOUNTER — Ambulatory Visit (INDEPENDENT_AMBULATORY_CARE_PROVIDER_SITE_OTHER)
Admission: RE | Admit: 2018-01-09 | Discharge: 2018-01-09 | Disposition: A | Discharge status: Home / Self Care | Payer: Medicare HMO | Source: Ambulatory Visit | Attending: Internal Medicine | Admitting: Internal Medicine

## 2018-01-09 ENCOUNTER — Other Ambulatory Visit (INDEPENDENT_AMBULATORY_CARE_PROVIDER_SITE_OTHER): Payer: Medicare HMO

## 2018-01-09 VITALS — BP 130/84 | HR 76 | Temp 97.7°F | Resp 16 | Wt 165.0 lb

## 2018-01-09 DIAGNOSIS — M8580 Other specified disorders of bone density and structure, unspecified site: Secondary | ICD-10-CM

## 2018-01-09 DIAGNOSIS — I480 Paroxysmal atrial fibrillation: Secondary | ICD-10-CM

## 2018-01-09 DIAGNOSIS — M85862 Other specified disorders of bone density and structure, left lower leg: Secondary | ICD-10-CM | POA: Diagnosis not present

## 2018-01-09 DIAGNOSIS — I10 Essential (primary) hypertension: Secondary | ICD-10-CM

## 2018-01-09 DIAGNOSIS — R600 Localized edema: Secondary | ICD-10-CM

## 2018-01-09 DIAGNOSIS — Z0000 Encounter for general adult medical examination without abnormal findings: Secondary | ICD-10-CM | POA: Diagnosis not present

## 2018-01-09 DIAGNOSIS — E7849 Other hyperlipidemia: Secondary | ICD-10-CM | POA: Diagnosis not present

## 2018-01-09 DIAGNOSIS — I2510 Atherosclerotic heart disease of native coronary artery without angina pectoris: Secondary | ICD-10-CM

## 2018-01-09 DIAGNOSIS — I2583 Coronary atherosclerosis due to lipid rich plaque: Secondary | ICD-10-CM

## 2018-01-09 DIAGNOSIS — R7303 Prediabetes: Secondary | ICD-10-CM

## 2018-01-09 DIAGNOSIS — K219 Gastro-esophageal reflux disease without esophagitis: Secondary | ICD-10-CM

## 2018-01-09 LAB — COMPREHENSIVE METABOLIC PANEL
ALBUMIN: 4.5 g/dL (ref 3.5–5.2)
ALT: 43 U/L — AB (ref 0–35)
AST: 40 U/L — AB (ref 0–37)
Alkaline Phosphatase: 68 U/L (ref 39–117)
BILIRUBIN TOTAL: 0.8 mg/dL (ref 0.2–1.2)
BUN: 25 mg/dL — AB (ref 6–23)
CHLORIDE: 101 meq/L (ref 96–112)
CO2: 30 meq/L (ref 19–32)
CREATININE: 1.01 mg/dL (ref 0.40–1.20)
Calcium: 10.2 mg/dL (ref 8.4–10.5)
GFR: 56.13 mL/min — ABNORMAL LOW (ref >60.00)
Glucose, Bld: 97 mg/dL (ref 70–99)
Potassium: 3.9 mEq/L (ref 3.5–5.1)
SODIUM: 140 meq/L (ref 135–145)
Total Protein: 7.3 g/dL (ref 6.0–8.3)

## 2018-01-09 LAB — CBC WITH DIFFERENTIAL/PLATELET
BASOS PCT: 0.9 % (ref 0.0–3.0)
Basophils Absolute: 0.1 10*3/uL (ref 0.0–0.1)
EOS PCT: 1.2 % (ref 0.0–5.0)
Eosinophils Absolute: 0.1 10*3/uL (ref 0.0–0.7)
HEMATOCRIT: 44.5 % (ref 36.0–46.0)
HEMOGLOBIN: 15.2 g/dL — AB (ref 12.0–15.0)
LYMPHS PCT: 23.6 % (ref 12.0–46.0)
Lymphs Abs: 1.6 10*3/uL (ref 0.7–4.0)
MCHC: 34.2 g/dL (ref 30.0–36.0)
MCV: 89 fl (ref 78.0–100.0)
MONOS PCT: 8.1 % (ref 3.0–12.0)
Monocytes Absolute: 0.6 10*3/uL (ref 0.1–1.0)
NEUTROS ABS: 4.6 10*3/uL (ref 1.4–7.7)
Neutrophils Relative %: 66.2 % (ref 43.0–77.0)
PLATELETS: 226 10*3/uL (ref 150.0–400.0)
RBC: 5 Mil/uL (ref 3.87–5.11)
RDW: 12.7 % (ref 11.5–15.5)
WBC: 7 10*3/uL (ref 4.0–10.5)

## 2018-01-09 LAB — HEMOGLOBIN A1C: HEMOGLOBIN A1C: 5.7 % (ref 4.6–6.5)

## 2018-01-09 NOTE — Assessment & Plan Note (Signed)
On prolia - last injection 06/2017 - due this month Due for dexa - will recheck - ordered Walking regularly Taking vitamin d and calcium

## 2018-01-09 NOTE — Assessment & Plan Note (Addendum)
Due to varicose veins Continue lasix 40 mg daily Strongly recommended retrying compression socks - even at light compression cmp

## 2018-01-09 NOTE — Assessment & Plan Note (Signed)
No chest pain, sob Up to date with cardiology Continue current medications

## 2018-01-09 NOTE — Assessment & Plan Note (Signed)
In afib, rate controlled Will check cmp, cbc Follows with cardio On eliquis, metoprolol

## 2018-01-09 NOTE — Assessment & Plan Note (Signed)
Not controlled - having dry cough, hoarseness and sore throat at times - likely GERD symptoms Did not tolerate zantac Continue omeprazole Start pepcid Revise diet

## 2018-01-09 NOTE — Assessment & Plan Note (Signed)
Lipids controlled Continue statin 

## 2018-01-09 NOTE — Progress Notes (Addendum)
Subjective:   Valerie Rollins is a 82 y.o. female who presents for Medicare Annual (Subsequent) preventive examination.  Review of Systems:  No ROS.  Medicare Wellness Visit. Additional risk factors are reflected in the social history.  Cardiac Risk Factors include: advanced age (>28men, >45 women);dyslipidemia;hypertension Sleep patterns: feels rested on waking, gets up 1-2 times nightly to void and sleeps 6-7 hours nightly.    Home Safety/Smoke Alarms: Feels safe in home. Smoke alarms in place.  Living environment; residence and Firearm Safety: 2-story house, no firearms Lives with husband, no needs for DME, good support system Seat Belt Safety/Bike Helmet: Wears seat belt.    Objective:     Vitals: There were no vitals taken for this visit.  There is no height or weight on file to calculate BMI.  Advanced Directives 01/09/2018 05/20/2015 04/01/2015 11/14/2011 11/12/2011  Does Patient Have a Medical Advance Directive? No No Yes Patient has advance directive, copy in chart Patient has advance directive, copy not in chart  Type of Advance Directive - - [place];Living will [place];Living will [place];Living will  Does patient want to make changes to medical advance directive? - - No - Patient declined - -  Copy of [place] in Chart? - - No - copy requested Copy requested from family Copy requested from family  Would patient like information on creating a medical advance directive? - No - patient declined information - - -  Pre-existing out of facility DNR order (yellow form or pink MOST form) - - - No -    Tobacco Social History   Tobacco Use  Smoking Status Never Smoker  Smokeless Tobacco Never Used     Counseling given: Not Answered  Past Medical History:  Diagnosis Date  . Ankle fracture, left 11/12/2011  . Atrial fibrillation ([place])    Post op after repair of ankle fracture  . 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 CAD (coronary artery disease) 03/2015   LAD 80%, otherwise minimal CAD. s/p LIMA-LAD  . Diverticulitis 2002   based on CT scan  . Diverticulosis    Dr Henrene Pastor  . DVT (deep venous thrombosis) ([place]) 2000   no trigger  . Endometriosis   . Gastritis 2006   @ Endo  . GERD (gastroesophageal reflux disease)   . Hiatal hernia   . Hyperlipidemia   . Hypertension   . IBS (irritable bowel syndrome)   . Ocular hypertension    glaucoma suspect, Dr. Kathrin Penner  . Skin cancer   . Superficial phlebitis     X 2, 1999, 2000  . Syncope 11/12/11   in context CAP . Carotid Doppler exam was negative. 2D echocardiogram revealed mild dilation of the left atrium and moderate increase in the systolic pressureof  the pulmonary artery  . UTI (lower urinary tract infection) 01/2013   Strep bovis ; PCN sensitive   Past Surgical History:  Procedure Laterality Date  . CARDIAC CATHETERIZATION N/A 03/28/2015   Procedure: Left Heart Cath and Coronary Angiography;  Surgeon: Jolaine Artist, MD;  Location: [facility]IVE [facility]UP[facility];  Service: Cardiovascular;  Laterality: N/A;  . CATARACT EXTRACTION Bilateral   . CATARACT EXTRACTION W/ INTRAOCULAR LENS IMPLANT     OS; Dr Kathrin Penner  . COLONOSCOPY  1995, 2002, 2012   diverticulosis; Dr Henrene Pastor  . CORONARY ARTERY BYPASS GRAFT N/A 04/01/2015   Procedure: CORONARY ARTERY  BYPASS GRAFTING (CABG), ON PUMP, TIMES ONE, USING LEFT INTERNAL MAMMARY ARTERY;  Surgeon: Ivin Poot, MD;  Location: [facility];  Service: Open Heart Surgery;  Laterality: N/A;  . EXCISION OF ATRIAL MYXOMA N/A 04/01/2015   Procedure: EXCISION OF ATRIAL MYXOMA;  Surgeon: Ivin Poot, MD;  Location: [facility];  Service: Open Heart Surgery;  Laterality: N/A;  . ORIF ANKLE FRACTURE  11/14/2011   Procedure: OPEN REDUCTION INTERNAL FIXATION (ORIF) ANKLE FRACTURE;  Surgeon: Colin Rhein;  Location: [facility];  Service: Orthopedics;  Laterality: Left;   open reduction internal fixation trimalleolar ankle fracture  . TEE WITHOUT CARDIOVERSION N/A 04/01/2015   Procedure: TRANSESOPHAGEAL ECHOCARDIOGRAM (TEE);  Surgeon: Ivin Poot, MD;  Location: [facility];  Service: Open Heart Surgery;  Laterality: N/A;  . TONSILLECTOMY    . TOTAL ABDOMINAL HYSTERECTOMY W/ BILATERAL SALPINGOOPHORECTOMY  1980   dysfunctional menses, endometriosis  . [place], 2009   Family History  Problem Relation Age of Onset  . Heart attack Father 16  . Alzheimer's disease Mother 36       TIAs  . Colon cancer Maternal Grandmother   . Heart attack Paternal Grandmother 82  . Hyperlipidemia Brother   . Hypertension Brother   . Diverticulitis Daughter        S/P colectomy  . Diabetes Neg Hx   . Stroke Neg Hx    Social History   Socioeconomic History  . Marital status: Married    Spouse name: None  . Number of children: 2  . Years of education: None  . Highest education level: None  Social Needs  . Financial resource strain: None  . Food insecurity - worry: None  . Food insecurity - inability: None  . Transportation needs - medical: None  . Transportation needs - non-medical: None  Occupational History  . Occupation: Retired    Fish farm manager: RETIRED  Tobacco Use  . Smoking status: Never Smoker  . Smokeless tobacco: Never Used  Substance and Sexual Activity  . Alcohol use: No  . Drug use: No  . Sexual activity: None  Other Topics Concern  . None  Social History Narrative   REG EXERCISE   HAD COLONOSCOPY AND ENDOSCOPY DONE DATES UNKNOWN    Outpatient Encounter Medications as of 01/09/2018  Medication Sig  . atorvastatin (LIPITOR) 10 MG tablet Take 1 tablet (10 mg total) by mouth daily at 6 PM.  . calcium citrate-vitamin D (CITRACAL+D) 315-200 MG-UNIT per tablet Take 2 tablets by mouth 2 (two) times daily.  . Cholecalciferol (VITAMIN D3) 1000 UNITS CAPS Take 1,000 Units by mouth daily.   . Coenzyme Q10 (CO Q 10 PO) Take by  mouth daily.  . COMBIVENT RESPIMAT 20-100 MCG/ACT AERS respimat Inhale 1 puff into the lungs as needed.  . dicyclomine (BENTYL) 20 MG tablet TAKE 1 TABLET (20 MG TOTAL) BY MOUTH EVERY 6 (SIX) HOURS AS NEEDED FOR SPASMS.  Marland Kitchen ELIQUIS 5 MG TABS tablet TAKE 1 TABLET (5 MG TOTAL) BY MOUTH 2 (TWO) TIMES DAILY.  . fexofenadine (ALLEGRA) 180 MG tablet Take 180 mg by mouth daily. Pt takes daily  . furosemide (LASIX) 40 MG tablet TAKE 1 TABLET (40 MG TOTAL) BY MOUTH DAILY.  Marland Kitchen latanoprost (XALATAN) 0.005 % ophthalmic solution Place 1 drop into both eyes daily.    Marland Kitchen losartan (COZAAR) 25 MG tablet TAKE 1 TABLET (25 MG TOTAL) BY MOUTH DAILY.  . Magnesium 400 MG CAPS Take 400 mg  by mouth every other day.   . metoprolol tartrate (LOPRESSOR) 25 MG tablet TAKE 1 AND 1/2 TABLET BY MOUTH TWICE A DAY  . montelukast (SINGULAIR) 10 MG tablet TAKE 1 TABLET (10 MG TOTAL) BY MOUTH AT BEDTIME.  . Multiple Vitamin (MULITIVITAMIN WITH MINERALS) TABS Take 1 tablet by mouth daily. (Patient taking differently: Take 2 tablets by mouth daily. )  . omeprazole (PRILOSEC) 20 MG capsule TAKE ONE CAPSULE BY MOUTH EVERY DAY  . traMADol (ULTRAM) 50 MG tablet Take 1 tablet (50 mg total) by mouth every 6 (six) hours as needed. Dr.Ramos (Patient taking differently: Take 50 mg by mouth every 6 (six) hours as needed. Dr.Ramos; pt takes 1 tablet every night, maybe 1 during the day as needed)  . [DISCONTINUED] Glucosamine HCl-MSM (GLUCOSAMINE-MSM PO) Take by mouth. 1500/1500 Take 3 Capsules w/ meal of choice.  . [DISCONTINUED] ranitidine (ZANTAC) 150 MG tablet Take 1 tablet (150 mg total) by mouth 2 (two) times daily. (Patient not taking: Reported on 01/09/2018)   No facility-administered encounter medications on file as of 01/09/2018.     Activities of Daily Living In your present state of health, do you have any difficulty performing the following activities: 01/09/2018  Hearing? N  Vision? N  Difficulty concentrating or making decisions? N    Walking or climbing stairs? N  Dressing or bathing? N  Doing errands, shopping? N  Preparing Food and eating ? N  Using the Toilet? N  In the past six months, have you accidently leaked urine? N  Do you have problems with loss of bowel control? N  Managing your Medications? N  Managing your Finances? N  Housekeeping or managing your Housekeeping? N  Some recent data might be hidden    Patient Care Team: Binnie Rail, MD as PCP - General (Internal Medicine)    Assessment:   This is a routine wellness examination for [place]. Physical assessment deferred to PCP.  Exercise Activities and Dietary recommendations Current Exercise Habits: Home exercise routine, Type of exercise: treadmill, Time (Minutes): 35, Frequency (Times/Week): 4, Weekly Exercise (Minutes/Week): 140, Intensity: Moderate, Exercise limited by: orthopedic condition(s)  Diet (meal preparation, eat out, water intake, caffeinated beverages, dairy products, fruits and vegetables): in general, a "healthy" diet  , well balanced eats a variety of fruits and vegetables daily, limits salt, fat/cholesterol, sugar, caffeine, drinks 6-8 glasses of water daily.      Goals    . Patient Stated     Stay as healthy and as independent as possible, continue to walk on the treadmill and eat healthy.        Fall Risk Fall Risk  01/09/2018 01/09/2018 11/29/2016 05/20/2015 11/13/2014  Falls in the past year? No No No No No    Depression Screen PHQ 2/9 Scores 01/09/2018 01/09/2018 11/29/2016 10/09/2015  PHQ - 2 Score 0 0 0 0  PHQ- 9 Score 0 - - -     Cognitive Function       Ad8 score reviewed for issues:  Issues making decisions: no  Less interest in hobbies / activities: no  Repeats questions, stories (family complaining): no  Trouble using ordinary gadgets (microwave, computer, phone):no  Forgets the month or year: no  Mismanaging finances: no  Remembering appts: no  Daily problems with thinking and/or memory: no Ad8  score is= 0    Immunization History  Administered Date(s) Administered  . H1N1 12/09/2008  . Influenza Split 09/23/2011, 08/31/2012  . Influenza Whole 09/28/2007, 09/09/2008, 09/08/2009, 08/14/2010  .  Influenza, High Dose Seasonal PF 09/20/2013, 09/16/2015, 09/07/2016, 08/22/2017  . Influenza,inj,Quad PF,6+ Mos 09/13/2014  . Pneumococcal Conjugate-13 09/13/2014  . Pneumococcal Polysaccharide-23 09/25/2015  . Td 06/02/2005  . Tdap 11/29/2016  . Zoster 09/18/2012   Screening Tests Health Maintenance  Topic Date Due  . DEXA SCAN  11/04/2017  . TETANUS/TDAP  11/29/2026  . INFLUENZA VACCINE  Completed  . PNA vac Low Risk Adult  Completed      Plan:    Continue doing brain stimulating activities (puzzles, reading, adult coloring books, staying active) to keep memory sharp.   Continue to eat heart healthy diet (full of fruits, vegetables, whole grains, lean protein, water--limit salt, fat, and sugar intake) and increase physical activity as tolerated.  I have personally reviewed and noted the following in the patient's chart:   . Medical and social history . Use of alcohol, tobacco or illicit drugs  . Current medications and supplements . Functional ability and status . Nutritional status . Physical activity . Advanced directives . List of other physicians . Vitals . Screenings to include cognitive, depression, and falls . Referrals and appointments  In addition, I have reviewed and discussed with patient certain preventive protocols, quality metrics, and best practice recommendations. A written personalized care plan for preventive services as well as general preventive health recommendations were provided to patient.     Michiel Cowboy, RN  01/09/2018   Medical screening examination/treatment/procedure(s) were performed by non-physician practitioner and as supervising physician I was immediately available for consultation/collaboration. I agree with above. Binnie Rail,  MD

## 2018-01-09 NOTE — Assessment & Plan Note (Signed)
Check a1c Low sugar / carb diet Stressed regular exercise   

## 2018-01-09 NOTE — Patient Instructions (Signed)
Continue doing brain stimulating activities (puzzles, reading, adult coloring books, staying active) to keep memory sharp.   Continue to eat heart healthy diet (full of fruits, vegetables, whole grains, lean protein, water--limit salt, fat, and sugar intake) and increase physical activity as tolerated.   Ms. Valerie Rollins , Thank you for taking time to come for your Medicare Wellness Visit. I appreciate your ongoing commitment to your health goals. Please review the following plan we discussed and let me know if I can assist you in the future.   These are the goals we discussed: Goals    . Patient Stated     Stay as healthy and as independent as possible, continue to walk on the treadmill and eat healthy.        This is a list of the screening recommended for you and due dates:  Health Maintenance  Topic Date Due  . DEXA scan (bone density measurement)  11/04/2017  . Tetanus Vaccine  11/29/2026  . Flu Shot  Completed  . Pneumonia vaccines  Completed    Food Choices for Gastroesophageal Reflux Disease, Adult When you have gastroesophageal reflux disease (GERD), the foods you eat and your eating habits are very important. Choosing the right foods can help ease the discomfort of GERD. Consider working with a diet and nutrition specialist (dietitian) to help you make healthy food choices. What general guidelines should I follow? Eating plan  Choose healthy foods low in fat, such as fruits, vegetables, whole grains, low-fat dairy products, and lean meat, fish, and poultry.  Eat frequent, small meals instead of three large meals each day. Eat your meals slowly, in a relaxed setting. Avoid bending over or lying down until 2-3 hours after eating.  Limit high-fat foods such as fatty meats or fried foods.  Limit your intake of oils, butter, and shortening to less than 8 teaspoons each day.  Avoid the following: ? Foods that cause symptoms. These may be different for different people. Keep a food  diary to keep track of foods that cause symptoms. ? Alcohol. ? Drinking large amounts of liquid with meals. ? Eating meals during the 2-3 hours before bed.  Cook foods using methods other than frying. This may include baking, grilling, or broiling. Lifestyle   Maintain a healthy weight. Ask your health care provider what weight is healthy for you. If you need to lose weight, work with your health care provider to do so safely.  Exercise for at least 30 minutes on 5 or more days each week, or as told by your health care provider.  Avoid wearing clothes that fit tightly around your waist and chest.  Do not use any products that contain nicotine or tobacco, such as cigarettes and e-cigarettes. If you need help quitting, ask your health care provider.  Sleep with the head of your bed raised. Use a wedge under the mattress or blocks under the bed frame to raise the head of the bed. What foods are not recommended? The items listed may not be a complete list. Talk with your dietitian about what dietary choices are best for you. Grains Pastries or quick breads with added fat. [place] toast. Vegetables Deep fried vegetables. [place] fries. Any vegetables prepared with added fat. Any vegetables that cause symptoms. For some people this may include tomatoes and tomato products, chili peppers, onions and garlic, and horseradish. Fruits Any fruits prepared with added fat. Any fruits that cause symptoms. For some people this may include citrus fruits, such as oranges,  grapefruit, pineapple, and lemons. Meats and other protein foods High-fat meats, such as fatty beef or pork, hot dogs, ribs, ham, sausage, salami and bacon. Fried meat or protein, including fried fish and fried chicken. Nuts and nut butters. Dairy Whole milk and chocolate milk. Sour cream. Cream. Ice cream. Cream cheese. Milk shakes. Beverages Coffee and tea, with or without caffeine. Carbonated beverages. Sodas. Energy drinks. Fruit  juice made with acidic fruits (such as orange or grapefruit). Tomato juice. Alcoholic drinks. Fats and oils Butter. Margarine. Shortening. Ghee. Sweets and desserts Chocolate and cocoa. Donuts. Seasoning and other foods Pepper. Peppermint and spearmint. Any condiments, herbs, or seasonings that cause symptoms. For some people, this may include curry, hot sauce, or vinegar-based salad dressings. Summary  When you have gastroesophageal reflux disease (GERD), food and lifestyle choices are very important to help ease the discomfort of GERD.  Eat frequent, small meals instead of three large meals each day. Eat your meals slowly, in a relaxed setting. Avoid bending over or lying down until 2-3 hours after eating.  Limit high-fat foods such as fatty meat or fried foods. This information is not intended to replace advice given to you by your health care provider. Make sure you discuss any questions you have with your health care provider. Document Released: 11/08/2005 Document Revised: 11/09/2016 Document Reviewed: 11/09/2016 Elsevier Interactive Patient Education  [employer].

## 2018-01-10 ENCOUNTER — Encounter: Payer: Self-pay | Admitting: Emergency Medicine

## 2018-01-16 ENCOUNTER — Encounter: Payer: Self-pay | Admitting: Emergency Medicine

## 2018-01-19 ENCOUNTER — Other Ambulatory Visit: Payer: Self-pay | Admitting: Internal Medicine

## 2018-02-02 ENCOUNTER — Telehealth: Payer: Self-pay | Admitting: Internal Medicine

## 2018-02-02 NOTE — Telephone Encounter (Signed)
Copied from [place] [phone]. Topic: Quick Communication - See Telephone Encounter >> Feb 02, 2018  3:19 PM Cleaster Corin, NT wrote: CRM for notification. See Telephone encounter for:   02/02/18. Pt. Calling to check to see if prolia shot has been approved.

## 2018-02-02 NOTE — Telephone Encounter (Signed)
Patient's insurance,insurance plan and member ID number have changed----patient is bringing new card to our office, patient will leave copy for tamara and prolia information will be resubmitted for new insurance card verification----

## 2018-02-03 NOTE — Telephone Encounter (Signed)
Waiting for PA request to be approved

## 2018-02-03 NOTE — Telephone Encounter (Signed)
Patients [employer] card is scanned into demographics.

## 2018-02-19 ENCOUNTER — Other Ambulatory Visit: Payer: Self-pay | Admitting: Cardiology

## 2018-02-20 DIAGNOSIS — M545 Low back pain: Secondary | ICD-10-CM | POA: Diagnosis not present

## 2018-02-20 DIAGNOSIS — M9903 Segmental and somatic dysfunction of lumbar region: Secondary | ICD-10-CM | POA: Diagnosis not present

## 2018-02-20 DIAGNOSIS — M9904 Segmental and somatic dysfunction of sacral region: Secondary | ICD-10-CM | POA: Diagnosis not present

## 2018-02-20 DIAGNOSIS — M62830 Muscle spasm of back: Secondary | ICD-10-CM | POA: Diagnosis not present

## 2018-02-20 DIAGNOSIS — M542 Cervicalgia: Secondary | ICD-10-CM | POA: Diagnosis not present

## 2018-02-20 DIAGNOSIS — M9901 Segmental and somatic dysfunction of cervical region: Secondary | ICD-10-CM | POA: Diagnosis not present

## 2018-02-22 DIAGNOSIS — M545 Low back pain: Secondary | ICD-10-CM | POA: Diagnosis not present

## 2018-02-22 DIAGNOSIS — M62830 Muscle spasm of back: Secondary | ICD-10-CM | POA: Diagnosis not present

## 2018-02-22 DIAGNOSIS — M9901 Segmental and somatic dysfunction of cervical region: Secondary | ICD-10-CM | POA: Diagnosis not present

## 2018-02-22 DIAGNOSIS — M9903 Segmental and somatic dysfunction of lumbar region: Secondary | ICD-10-CM | POA: Diagnosis not present

## 2018-02-22 DIAGNOSIS — M9904 Segmental and somatic dysfunction of sacral region: Secondary | ICD-10-CM | POA: Diagnosis not present

## 2018-02-22 DIAGNOSIS — M542 Cervicalgia: Secondary | ICD-10-CM | POA: Diagnosis not present

## 2018-02-22 NOTE — Telephone Encounter (Signed)
Advised patient we recd additional fax from insurance co requesting additional information for prolia approval authorization---I have completed new form and placed on dr burns desk for signature---I will be refaxing form back and hopefully will hear from insurance within the next 2 weeks--after approval is recd, I will call patient to schedule prolia injection

## 2018-02-22 NOTE — Telephone Encounter (Signed)
Patient is inquiring on PA.  Please follow up in regard.

## 2018-02-23 DIAGNOSIS — M9903 Segmental and somatic dysfunction of lumbar region: Secondary | ICD-10-CM | POA: Diagnosis not present

## 2018-02-23 DIAGNOSIS — M9904 Segmental and somatic dysfunction of sacral region: Secondary | ICD-10-CM | POA: Diagnosis not present

## 2018-02-23 DIAGNOSIS — M62830 Muscle spasm of back: Secondary | ICD-10-CM | POA: Diagnosis not present

## 2018-02-23 DIAGNOSIS — M542 Cervicalgia: Secondary | ICD-10-CM | POA: Diagnosis not present

## 2018-02-23 DIAGNOSIS — M9901 Segmental and somatic dysfunction of cervical region: Secondary | ICD-10-CM | POA: Diagnosis not present

## 2018-02-23 DIAGNOSIS — M545 Low back pain: Secondary | ICD-10-CM | POA: Diagnosis not present

## 2018-02-23 NOTE — Progress Notes (Signed)
HPI: FU CAD and atrial myxoma resection. Echo 4/16 orderded by primary care for cardiomegaly; this revealed normal LV function, left atrial mass; mild MR. Cardiac cath 5/16 showed 80 LAD. Preop carotid dopplers 5/16 with no significant stenosis. Patient had CABG with LIMA to LAD, resection of atrial myxoma and LAA oversewn. Echo 7/16 showed normal LV function, grade 2 diastolic dysfunction, mild MR, mild biatrial enlargement and moderate TR. Patient did have postoperative atrial fibrillation treated with amiodarone. Patient had recurrent atrial fibrillation in September 2016. She has been treated with rate controlling medications and anticoagulation. EchocardiogramSeptember 2017 showed normal LV systolic function, mild mitral regurgitation, mild left atrial enlargement, moderate tricuspid regurgitation and moderately elevated pulmonary pressures. No recurrent myxoma. Holter 4/18 showed atrial fibrillation, rate mildly decreased late PM and early AM but otherwise controlled. Since last seen, patient denies dyspnea, chest pain, palpitations or syncope.  Current Outpatient Medications  Medication Sig Dispense Refill  . atorvastatin (LIPITOR) 10 MG tablet Take 1 tablet (10 mg total) by mouth daily at 6 PM. 90 tablet 3  . calcium citrate-vitamin D (CITRACAL+D) 315-200 MG-UNIT per tablet Take 2 tablets by mouth 2 (two) times daily.    . Cholecalciferol (VITAMIN D3) 1000 UNITS CAPS Take 1,000 Units by mouth daily.     . Coenzyme Q10 (CO Q 10 PO) Take by mouth daily.    . COMBIVENT RESPIMAT 20-100 MCG/ACT AERS respimat Inhale 1 puff into the lungs as needed. 4 g 5  . dicyclomine (BENTYL) 20 MG tablet TAKE 1 TABLET (20 MG TOTAL) BY MOUTH EVERY 6 (SIX) HOURS AS NEEDED FOR SPASMS. 30 tablet 1  . ELIQUIS 5 MG TABS tablet TAKE 1 TABLET (5 MG TOTAL) BY MOUTH 2 (TWO) TIMES DAILY. 60 tablet 4  . fexofenadine (ALLEGRA) 180 MG tablet Take 180 mg by mouth daily. Pt takes daily    . furosemide (LASIX) 40 MG  tablet TAKE 1 TABLET (40 MG TOTAL) BY MOUTH DAILY. 90 tablet 1  . latanoprost (XALATAN) 0.005 % ophthalmic solution Place 1 drop into both eyes daily.      Marland Kitchen losartan (COZAAR) 25 MG tablet TAKE 1 TABLET (25 MG TOTAL) BY MOUTH DAILY. 90 tablet 3  . Magnesium 400 MG CAPS Take 400 mg by mouth every other day.     . metoprolol tartrate (LOPRESSOR) 25 MG tablet TAKE 1 AND 1/2 TABLET BY MOUTH TWICE A DAY 270 tablet 3  . montelukast (SINGULAIR) 10 MG tablet TAKE 1 TABLET (10 MG TOTAL) BY MOUTH AT BEDTIME. 90 tablet 2  . Multiple Vitamin (MULITIVITAMIN WITH MINERALS) TABS Take 1 tablet by mouth daily. (Patient taking differently: Take 2 tablets by mouth daily. )    . omeprazole (PRILOSEC) 20 MG capsule TAKE ONE CAPSULE BY MOUTH EVERY DAY 90 capsule 1  . traMADol (ULTRAM) 50 MG tablet Take 1 tablet (50 mg total) by mouth every 6 (six) hours as needed. Dr.Ramos (Patient taking differently: Take 50 mg by mouth every 6 (six) hours as needed. Dr.Ramos; pt takes 1 tablet every night, maybe 1 during the day as needed) 30 tablet 0   No current facility-administered medications for this visit.      Past Medical History:  Diagnosis Date  . Ankle fracture, left 11/12/2011  . Atrial fibrillation ([place])    Post op after repair of ankle fracture  . 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 CAD (coronary artery disease) 03/2015  LAD 80%, otherwise minimal CAD. s/p LIMA-LAD  . Diverticulitis 2002   based on CT scan  . Diverticulosis    Dr Henrene Pastor  . DVT (deep venous thrombosis) ([place]) 2000   no trigger  . Endometriosis   . Gastritis 2006   @ Endo  . GERD (gastroesophageal reflux disease)   . Hiatal hernia   . Hyperlipidemia   . Hypertension   . IBS (irritable bowel syndrome)   . Ocular hypertension    glaucoma suspect, Dr. Kathrin Penner  . Skin cancer   . Superficial phlebitis     X 2, 1999, 2000  . Syncope 11/12/11   in context CAP . Carotid Doppler exam was negative.  2D echocardiogram revealed mild dilation of the left atrium and moderate increase in the systolic pressureof  the pulmonary artery  . UTI (lower urinary tract infection) 01/2013   Strep bovis ; PCN sensitive    Past Surgical History:  Procedure Laterality Date  . CARDIAC CATHETERIZATION N/A 03/28/2015   Procedure: Left Heart Cath and Coronary Angiography;  Surgeon: Jolaine Artist, MD;  Location: [facility];  Service: Cardiovascular;  Laterality: N/A;  . CATARACT EXTRACTION Bilateral   . CATARACT EXTRACTION W/ INTRAOCULAR LENS IMPLANT     OS; Dr Kathrin Penner  . COLONOSCOPY  1995, 2002, 2012   diverticulosis; Dr Henrene Pastor  . CORONARY ARTERY BYPASS GRAFT N/A 04/01/2015   Procedure: CORONARY ARTERY BYPASS GRAFTING (CABG), ON PUMP, TIMES ONE, USING LEFT INTERNAL MAMMARY ARTERY;  Surgeon: Ivin Poot, MD;  Location: [facility];  Service: Open Heart Surgery;  Laterality: N/A;  . EXCISION OF ATRIAL MYXOMA N/A 04/01/2015   Procedure: EXCISION OF ATRIAL MYXOMA;  Surgeon: Ivin Poot, MD;  Location: [facility];  Service: Open Heart Surgery;  Laterality: N/A;  . ORIF ANKLE FRACTURE  11/14/2011   Procedure: OPEN REDUCTION INTERNAL FIXATION (ORIF) ANKLE FRACTURE;  Surgeon: Colin Rhein;  Location: [facility];  Service: Orthopedics;  Laterality: Left;  open reduction internal fixation trimalleolar ankle fracture  . TEE WITHOUT CARDIOVERSION N/A 04/01/2015   Procedure: TRANSESOPHAGEAL ECHOCARDIOGRAM (TEE);  Surgeon: Ivin Poot, MD;  Location: [facility];  Service: Open Heart Surgery;  Laterality: N/A;  . TONSILLECTOMY    . TOTAL ABDOMINAL HYSTERECTOMY W/ BILATERAL SALPINGOOPHORECTOMY  1980   dysfunctional menses, endometriosis  . [place], 2009    Social History   Socioeconomic History  . Marital status: Married    Spouse name: Not on file  . Number of children: 2  . Years of education: Not on file  . Highest education level: Not on file  Occupational History  .  Occupation: Retired    Fish farm manager: RETIRED  Social Needs  . Financial resource strain: Not on file  . Food insecurity:    Worry: Not on file    Inability: Not on file  . Transportation needs:    Medical: Not on file    Non-medical: Not on file  Tobacco Use  . Smoking status: Never Smoker  . Smokeless tobacco: Never Used  Substance and Sexual Activity  . Alcohol use: No  . Drug use: No  . Sexual activity: Not on file  Lifestyle  . Physical activity:    Days per week: Not on file    Minutes per session: Not on file  . Stress: Not on file  Relationships  . Social connections:    Talks on phone: Not on file  Gets together: Not on file    Attends religious service: Not on file    Active member of club or organization: Not on file    Attends meetings of clubs or organizations: Not on file    Relationship status: Not on file  . Intimate partner violence:    Fear of current or ex partner: Not on file    Emotionally abused: Not on file    Physically abused: Not on file    Forced sexual activity: Not on file  Other Topics Concern  . Not on file  Social History Narrative   REG EXERCISE   HAD COLONOSCOPY AND ENDOSCOPY DONE DATES UNKNOWN    Family History  Problem Relation Age of Onset  . Heart attack Father 20  . Alzheimer's disease Mother 56       TIAs  . Colon cancer Maternal Grandmother   . Heart attack Paternal Grandmother 82  . Hyperlipidemia Brother   . Hypertension Brother   . Diverticulitis Daughter        S/P colectomy  . Diabetes Neg Hx   . Stroke Neg Hx     ROS: Hip pain but no fevers or chills, productive cough, hemoptysis, dysphasia, odynophagia, melena, hematochezia, dysuria, hematuria, rash, seizure activity, orthopnea, PND, pedal edema, claudication. Remaining systems are negative.  Physical Exam: Well-developed well-nourished in no acute distress.  Skin is warm and dry.  HEENT is normal.  Neck is supple.  Chest is clear to auscultation with normal  expansion.  Cardiovascular exam is irregular Abdominal exam nontender or distended. No masses palpated. Extremities show no edema. neuro grossly intact  A/P  1 permanent atrial fibrillation-plan as outlined in previous notes is rate control and anticoagulation.  Continue metoprolol and apixaban.    2 coronary artery disease-continue statin.  She is not on aspirin given need for anticoagulation.  3 hypertension-blood pressure is controlled.  Continue present medications.  4 Hyperlipidemia-patient describes some cramping in the lower extremities.  We will hold Lipitor for 6 weeks.  If symptoms improve we will try a different statin.  Otherwise will resume Lipitor.  5 prior resection of atrial myxoma-no evidence of recurrence on most recent echocardiogram.  6 edema-continue present dose of Lasix.  Kirk Ruths, MD

## 2018-02-28 ENCOUNTER — Ambulatory Visit: Payer: Medicare HMO | Admitting: Cardiology

## 2018-02-28 ENCOUNTER — Encounter: Payer: Self-pay | Admitting: Cardiology

## 2018-02-28 VITALS — BP 126/86 | HR 76 | Ht 67.0 in | Wt 169.4 lb

## 2018-02-28 DIAGNOSIS — I10 Essential (primary) hypertension: Secondary | ICD-10-CM

## 2018-02-28 DIAGNOSIS — E7800 Pure hypercholesterolemia, unspecified: Secondary | ICD-10-CM

## 2018-02-28 DIAGNOSIS — I482 Chronic atrial fibrillation: Secondary | ICD-10-CM

## 2018-02-28 DIAGNOSIS — I2510 Atherosclerotic heart disease of native coronary artery without angina pectoris: Secondary | ICD-10-CM

## 2018-02-28 DIAGNOSIS — I4821 Permanent atrial fibrillation: Secondary | ICD-10-CM

## 2018-02-28 NOTE — Patient Instructions (Signed)
Medication Instructions:   DO NOT TAKE ATORVASTATIN FOR 6 WEEKS AND THEN CALL  Follow-Up:  Your physician wants you to follow-up in: Rule will receive a reminder letter in the mail two months in advance. If you don't receive a letter, please call our office to schedule the follow-up appointment.   If you need a refill on your cardiac medications before your next appointment, please call your pharmacy.

## 2018-03-01 DIAGNOSIS — M9903 Segmental and somatic dysfunction of lumbar region: Secondary | ICD-10-CM | POA: Diagnosis not present

## 2018-03-01 DIAGNOSIS — M542 Cervicalgia: Secondary | ICD-10-CM | POA: Diagnosis not present

## 2018-03-01 DIAGNOSIS — M545 Low back pain: Secondary | ICD-10-CM | POA: Diagnosis not present

## 2018-03-01 DIAGNOSIS — M62830 Muscle spasm of back: Secondary | ICD-10-CM | POA: Diagnosis not present

## 2018-03-01 DIAGNOSIS — M9901 Segmental and somatic dysfunction of cervical region: Secondary | ICD-10-CM | POA: Diagnosis not present

## 2018-03-01 DIAGNOSIS — M9904 Segmental and somatic dysfunction of sacral region: Secondary | ICD-10-CM | POA: Diagnosis not present

## 2018-03-06 DIAGNOSIS — M545 Low back pain: Secondary | ICD-10-CM | POA: Diagnosis not present

## 2018-03-06 DIAGNOSIS — M542 Cervicalgia: Secondary | ICD-10-CM | POA: Diagnosis not present

## 2018-03-06 DIAGNOSIS — M9904 Segmental and somatic dysfunction of sacral region: Secondary | ICD-10-CM | POA: Diagnosis not present

## 2018-03-06 DIAGNOSIS — M9901 Segmental and somatic dysfunction of cervical region: Secondary | ICD-10-CM | POA: Diagnosis not present

## 2018-03-06 DIAGNOSIS — M62830 Muscle spasm of back: Secondary | ICD-10-CM | POA: Diagnosis not present

## 2018-03-06 DIAGNOSIS — M9903 Segmental and somatic dysfunction of lumbar region: Secondary | ICD-10-CM | POA: Diagnosis not present

## 2018-03-13 ENCOUNTER — Other Ambulatory Visit: Payer: Self-pay | Admitting: Internal Medicine

## 2018-03-14 DIAGNOSIS — M62830 Muscle spasm of back: Secondary | ICD-10-CM | POA: Diagnosis not present

## 2018-03-14 DIAGNOSIS — M9903 Segmental and somatic dysfunction of lumbar region: Secondary | ICD-10-CM | POA: Diagnosis not present

## 2018-03-14 DIAGNOSIS — M9904 Segmental and somatic dysfunction of sacral region: Secondary | ICD-10-CM | POA: Diagnosis not present

## 2018-03-14 DIAGNOSIS — M542 Cervicalgia: Secondary | ICD-10-CM | POA: Diagnosis not present

## 2018-03-14 DIAGNOSIS — M545 Low back pain: Secondary | ICD-10-CM | POA: Diagnosis not present

## 2018-03-14 DIAGNOSIS — M9901 Segmental and somatic dysfunction of cervical region: Secondary | ICD-10-CM | POA: Diagnosis not present

## 2018-03-19 ENCOUNTER — Other Ambulatory Visit: Payer: Self-pay | Admitting: Cardiology

## 2018-03-20 NOTE — Telephone Encounter (Signed)
Rx sent to pharmacy   

## 2018-03-21 ENCOUNTER — Encounter: Payer: Self-pay | Admitting: Family Medicine

## 2018-03-21 ENCOUNTER — Ambulatory Visit (INDEPENDENT_AMBULATORY_CARE_PROVIDER_SITE_OTHER): Payer: Medicare HMO | Admitting: Family Medicine

## 2018-03-21 VITALS — BP 124/76 | HR 84 | Temp 98.4°F | Ht 67.0 in | Wt 161.0 lb

## 2018-03-21 DIAGNOSIS — J301 Allergic rhinitis due to pollen: Secondary | ICD-10-CM | POA: Insufficient documentation

## 2018-03-21 MED ORDER — FLUTICASONE PROPIONATE 50 MCG/ACT NA SUSP: 2.0000 | Freq: Every day | NASAL | 2 refills | Status: DC

## 2018-03-21 NOTE — Progress Notes (Signed)
Valerie Rollins - 82 y.o. female MRN [id number]  Date of birth: [birth year]  SUBJECTIVE:  Including CC & ROS.  Chief Complaint  Patient presents with  . Cough    Valerie Rollins is a 82 y.o. female that is presenting with a cough. Ongoing for four days. Denies fevers. Denies productive cough. She has been using cough drops. Denies shortness of breath.     Review of Systems  Constitutional: Negative for fever.  HENT: Positive for congestion and sinus pressure.   Respiratory: Positive for cough.   Cardiovascular: Negative for chest pain.  Gastrointestinal: Negative for abdominal pain.    HISTORY: Past Medical, Surgical, Social, and Family History Reviewed & Updated per EMR.   Pertinent Historical Findings include:  Past Medical History:  Diagnosis Date  . Ankle fracture, left 11/12/2011  . Atrial fibrillation ([place])    Post op after repair of ankle fracture  . 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 CAD (coronary artery disease) 03/2015   LAD 80%, otherwise minimal CAD. s/p LIMA-LAD  . Diverticulitis 2002   based on CT scan  . Diverticulosis    Dr Henrene Pastor  . DVT (deep venous thrombosis) ([place]) 2000   no trigger  . Endometriosis   . Gastritis 2006   @ Endo  . GERD (gastroesophageal reflux disease)   . Hiatal hernia   . Hyperlipidemia   . Hypertension   . IBS (irritable bowel syndrome)   . Ocular hypertension    glaucoma suspect, Dr. Kathrin Penner  . Skin cancer   . Superficial phlebitis     X 2, 1999, 2000  . Syncope 11/12/11   in context CAP . Carotid Doppler exam was negative. 2D echocardiogram revealed mild dilation of the left atrium and moderate increase in the systolic pressureof  the pulmonary artery  . UTI (lower urinary tract infection) 01/2013   Strep bovis ; PCN sensitive    Past Surgical History:  Procedure Laterality Date  . CARDIAC CATHETERIZATION N/A 03/28/2015   Procedure: Left Heart Cath and Coronary Angiography;  Surgeon:  Jolaine Artist, MD;  Location: [facility] INVASIVE CV LAB [facility]UP[facility];  Service: Cardiovascular;  Laterality: N/A;  . CATARACT EXTRACTION Bilateral   . CATARACT EXTRACTION W/ INTRAOCULAR LENS IMPLANT     OS; Dr Kathrin Penner  . COLONOSCOPY  1995, 2002, 2012   diverticulosis; Dr Henrene Pastor  . CORONARY ARTERY BYPASS GRAFT N/A 04/01/2015   Procedure: CORONARY ARTERY BYPASS GRAFTING (CABG), ON PUMP, TIMES ONE, USING LEFT INTERNAL MAMMARY ARTERY;  Surgeon: Ivin Poot, MD;  Location: [facility];  Service: Open Heart Surgery;  Laterality: N/A;  . EXCISION OF ATRIAL MYXOMA N/A 04/01/2015   Procedure: EXCISION OF ATRIAL MYXOMA;  Surgeon: Ivin Poot, MD;  Location: [facility];  Service: Open Heart Surgery;  Laterality: N/A;  . ORIF ANKLE FRACTURE  11/14/2011   Procedure: OPEN REDUCTION INTERNAL FIXATION (ORIF) ANKLE FRACTURE;  Surgeon: Colin Rhein;  [facility];  Service: Orthopedics;  Laterality: Left;  open reduction internal fixation trimalleolar ankle fracture  . TEE WITHOUT CARDIOVERSION N/A 04/01/2015   Procedure: TRANSESOPHAGEAL ECHOCARDIOGRAM (TEE);  Surgeon: Ivin Poot, MD;  Location: [facility];  Service: Open Heart Surgery;  Laterality: N/A;  . TONSILLECTOMY    . TOTAL ABDOMINAL HYSTERECTOMY W/ BILATERAL SALPINGOOPHORECTOMY  1980   dysfunctional menses, endometriosis  . [place], 2009    Allergies  Allergen Reactions  .  Oxybutynin Chloride     Tongue swelling; Rx from Dr Hessie Diener  . Propoxyphene N-Acetaminophen     REACTION: made pt blood to thin  . Alendronate Sodium     ? reaction  . Ramipril     REACTION: cough    Family History  Problem Relation Age of Onset  . Heart attack Father 29  . Alzheimer's disease Mother 108       TIAs  . Colon cancer Maternal Grandmother   . Heart attack Paternal Grandmother 82  . Hyperlipidemia Brother   . Hypertension Brother   . Diverticulitis Daughter        S/P colectomy  . Diabetes Neg Hx   . Stroke Neg Hx      Social  History   Socioeconomic History  . Marital status: Married    Spouse name: Not on file  . Number of children: 2  . Years of education: Not on file  . Highest education level: Not on file  Occupational History  . Occupation: Retired    Fish farm manager: RETIRED  Social Needs  . Financial resource strain: Not on file  . Food insecurity:    Worry: Not on file    Inability: Not on file  . Transportation needs:    Medical: Not on file    Non-medical: Not on file  Tobacco Use  . Smoking status: Never Smoker  . Smokeless tobacco: Never Used  Substance and Sexual Activity  . Alcohol use: No  . Drug use: No  . Sexual activity: Not on file  Lifestyle  . Physical activity:    Days per week: Not on file    Minutes per session: Not on file  . Stress: Not on file  Relationships  . Social connections:    Talks on phone: Not on file    Gets together: Not on file    Attends religious service: Not on file    Active member of club or organization: Not on file    Attends meetings of clubs or organizations: Not on file    Relationship status: Not on file  . Intimate partner violence:    Fear of current or ex partner: Not on file    Emotionally abused: Not on file    Physically abused: Not on file    Forced sexual activity: Not on file  Other Topics Concern  . Not on file  Social History Narrative   REG EXERCISE   HAD COLONOSCOPY AND ENDOSCOPY DONE DATES UNKNOWN     PHYSICAL EXAM:  VS: BP 124/76 (BP Location: Left Arm, Patient Position: Sitting, Cuff Size: Normal)   Pulse 84   Temp 98.4 F (36.9 C) (Oral)   Ht 5\' 7"  (1.702 m)   Wt 161 lb (73 kg)   SpO2 96%   BMI 25.22 kg/m  Physical Exam Gen: NAD, alert, cooperative with exam,  ENT: normal lips, normal nasal mucosa, tympanic membranes clear and intact bilaterally, normal oropharynx,  Eye: normal EOM, normal conjunctiva and lids CV:  no edema, +2 pedal pulses, regular rate and rhythm, S1-S2   Resp: no accessory muscle use,  non-labored, clear to auscultation bilaterally, no crackles or wheezes Skin: no rashes, no areas of induration  Neuro: normal tone, normal sensation to touch Psych:  normal insight, alert and oriented MSK: Normal gait, normal strength       ASSESSMENT & PLAN:   Seasonal allergic rhinitis due to pollen Symptoms likely allergic in nature. No fever and no contact with someone  with similar complaints.  - flonase  - counseled on supportive care  - given indications to follow up

## 2018-03-21 NOTE — Assessment & Plan Note (Signed)
Symptoms likely allergic in nature. No fever and no contact with someone with similar complaints.  - flonase  - counseled on supportive care  - given indications to follow up

## 2018-03-21 NOTE — Patient Instructions (Addendum)
Try flonase twice a day  Please try things such as zyrtec-D or allegra-D which is an antihistamine and decongestant.   Please try afrin which will help with nasal congestion but use for only three days.   Please also try using a netti pot on a regular occasion.  Honey can help with a sore throat.   Please follow up in one week if your symptoms don't improve

## 2018-03-29 NOTE — Progress Notes (Signed)
Subjective:    Patient ID: Valerie Rollins, female    DOB: August 04, 1936, 82 y.o.   MRN: [id number]  HPI The patient is here for an acute visit.  She saw Dr Raeford Razor on 03/21/18 for a cough.  He thought she was experiencing seasonal allergies and advised flonase and symptomatic treatment.  Her symptoms started more than 10 days.  She was concerned about the possibility of a sinus infection.  She is taking the nasal spray, allegra and singulair.  Some of her symptoms have improved, but does have persisted  Her postnasal drip, sneezing and cough have resolved with the nasal spray.  She has been very mild nasal congestion that is clear.  She states a sore throat, but wonders if that is from GERD.  She denies any symptoms of GERD, but typically does not feel it.  She has stated very mild shortness of breath with exertion.  She has had some headaches-particularly in the frontal region and some lightheadedness.  She did switch from taking the omeprazole daily to the ranitidine.  She is unsure if her GERD is controlled.  She is due for a Prolia injection tomorrow and would ideally like to get it today if possible.  Medications and allergies reviewed with patient and updated if appropriate.  Patient Active Problem List   Diagnosis Date Noted  . Seasonal allergic rhinitis due to pollen 03/21/2018  . Bilateral leg edema 12/31/2016  . Prediabetes 01/10/2016  . Osteopenia 12/26/2015  . Mass of neck 07/07/2015  . Encounter for therapeutic drug monitoring 05/14/2015  . Long-term (current) use of anticoagulants 04/16/2015  . S/P CABG x 1 04/01/2015  . CAD (coronary artery disease) 03/28/2015  . 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 Cardiomegaly 02/20/2015  . Hypotension, postural 06/07/2013  . DDD (degenerative disc disease), lumbar 07/20/2012  . Atrial fibrillation ([place]) 11/15/2011  . Syncope 11/12/2011  . Hypokalemia 11/12/2011  . RBBB (right bundle branch block) 11/12/2011  . HTN (hypertension),  malignant 11/12/2011  . HOARSENESS 02/23/2010  . SKIN CANCER, HX OF 11/18/2009  . Diverticulosis of large intestine 10/31/2008  . HEART MURMUR, SYSTOLIC 09/15/8526  . VARICOSE VEINS, LOWER EXTREMITIES 03/13/2008  . GERD 02/20/2008  . IBS 02/20/2008  . OSTEOARTHRITIS 02/20/2008  . Hyperlipidemia 12/01/2006  . Essential hypertension 12/01/2006  . SUPERFICIAL THROMBOPHLEBITIS 12/01/2006  . DVT, HX OF 12/01/2006    Current Outpatient Medications on File Prior to Visit  Medication Sig Dispense Refill  . atorvastatin (LIPITOR) 10 MG tablet TAKE 1 TABLET BY MOUTH EVERY DAY 90 tablet 1  . calcium citrate-vitamin D (CITRACAL+D) 315-200 MG-UNIT per tablet Take 2 tablets by mouth 2 (two) times daily.    . Cholecalciferol (VITAMIN D3) 1000 UNITS CAPS Take 1,000 Units by mouth daily.     . Coenzyme Q10 (CO Q 10 PO) Take by mouth daily.    . COMBIVENT RESPIMAT 20-100 MCG/ACT AERS respimat Inhale 1 puff into the lungs as needed. 4 g 5  . dicyclomine (BENTYL) 20 MG tablet TAKE 1 TABLET (20 MG TOTAL) BY MOUTH EVERY 6 (SIX) HOURS AS NEEDED FOR SPASMS. 30 tablet 1  . ELIQUIS 5 MG TABS tablet TAKE 1 TABLET (5 MG TOTAL) BY MOUTH 2 (TWO) TIMES DAILY. 60 tablet 4  . fexofenadine (ALLEGRA) 180 MG tablet Take 180 mg by mouth daily. Pt takes daily    . fluticasone (FLONASE) 50 MCG/ACT nasal spray Place 2 sprays into both nostrils daily. 1 g 2  . furosemide (LASIX) 40 MG tablet  TAKE 1 TABLET (40 MG TOTAL) BY MOUTH DAILY. 90 tablet 1  . latanoprost (XALATAN) 0.005 % ophthalmic solution Place 1 drop into both eyes daily.      Marland Kitchen losartan (COZAAR) 25 MG tablet TAKE 1 TABLET (25 MG TOTAL) BY MOUTH DAILY. 90 tablet 3  . Magnesium 400 MG CAPS Take 400 mg by mouth every other day.     . metoprolol tartrate (LOPRESSOR) 25 MG tablet TAKE 1 AND 1/2 TABLET BY MOUTH TWICE A DAY 270 tablet 3  . montelukast (SINGULAIR) 10 MG tablet TAKE 1 TABLET AT BEDTIME 90 tablet 3  . Multiple Vitamin (MULITIVITAMIN WITH MINERALS) TABS  Take 1 tablet by mouth daily. (Patient taking differently: Take 2 tablets by mouth daily. )    . omeprazole (PRILOSEC) 20 MG capsule TAKE ONE CAPSULE BY MOUTH EVERY DAY 90 capsule 1  . traMADol (ULTRAM) 50 MG tablet Take 1 tablet (50 mg total) by mouth every 6 (six) hours as needed. Dr.Ramos (Patient taking differently: Take 50 mg by mouth every 6 (six) hours as needed. Dr.Ramos; pt takes 1 tablet every night, maybe 1 during the day as needed) 30 tablet 0   No current facility-administered medications on file prior to visit.     Past Medical History:  Diagnosis Date  . Ankle fracture, left 11/12/2011  . Atrial fibrillation ([place])    Post op after repair of ankle fracture  . 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 CAD (coronary artery disease) 03/2015   LAD 80%, otherwise minimal CAD. s/p LIMA-LAD  . Diverticulitis 2002   based on CT scan  . Diverticulosis    Dr Henrene Pastor  . DVT (deep venous thrombosis) ([place]) 2000   no trigger  . Endometriosis   . Gastritis 2006   @ Endo  . GERD (gastroesophageal reflux disease)   . Hiatal hernia   . Hyperlipidemia   . Hypertension   . IBS (irritable bowel syndrome)   . Ocular hypertension    glaucoma suspect, Dr. Kathrin Penner  . Skin cancer   . Superficial phlebitis     X 2, 1999, 2000  . Syncope 11/12/11   in context CAP . Carotid Doppler exam was negative. 2D echocardiogram revealed mild dilation of the left atrium and moderate increase in the systolic pressureof  the pulmonary artery  . UTI (lower urinary tract infection) 01/2013   Strep bovis ; PCN sensitive    Past Surgical History:  Procedure Laterality Date  . CARDIAC CATHETERIZATION N/A 03/28/2015   Procedure: Left Heart Cath and Coronary Angiography;  Surgeon: Jolaine Artist, MD;  Location: [place] [facility] INVASIVE CV LAB CUPID;  Service: Cardiovascular;  Laterality: N/A;  . CATARACT EXTRACTION Bilateral   . CATARACT EXTRACTION W/ INTRAOCULAR LENS IMPLANT      OS; Dr Kathrin Penner  . COLONOSCOPY  1995, 2002, 2012   diverticulosis; Dr Henrene Pastor  . CORONARY ARTERY BYPASS GRAFT N/A 04/01/2015   Procedure: CORONARY ARTERY BYPASS GRAFTING (CABG), ON PUMP, TIMES ONE, USING LEFT INTERNAL MAMMARY ARTERY;  Surgeon: Ivin Poot, MD;  Location: [facility];  Service: Open Heart Surgery;  Laterality: N/A;  . EXCISION OF ATRIAL MYXOMA N/A 04/01/2015   Procedure: EXCISION OF ATRIAL MYXOMA;  Surgeon: Ivin Poot, MD;  Location: [facility];  Service: Open Heart Surgery;  Laterality: N/A;  . ORIF ANKLE FRACTURE  11/14/2011   Procedure: OPEN REDUCTION INTERNAL FIXATION (ORIF) ANKLE FRACTURE;  Surgeon: Valerie Rhein;  [facility];  Service: Orthopedics;  Laterality: Left;  open reduction internal fixation trimalleolar ankle fracture  . TEE WITHOUT CARDIOVERSION N/A 04/01/2015   Procedure: TRANSESOPHAGEAL ECHOCARDIOGRAM (TEE);  Surgeon: Ivin Poot, MD;  Location: [facility];  Service: Open Heart Surgery;  Laterality: N/A;  . TONSILLECTOMY    . TOTAL ABDOMINAL HYSTERECTOMY W/ BILATERAL SALPINGOOPHORECTOMY  1980   dysfunctional menses, endometriosis  . [place], 2009    Social History   Socioeconomic History  . Marital status: Married    Spouse name: Not on file  . Number of children: 2  . Years of education: Not on file  . Highest education level: Not on file  Occupational History  . Occupation: Retired    Fish farm manager: RETIRED  Social Needs  . Financial resource strain: Not on file  . Food insecurity:    Worry: Not on file    Inability: Not on file  . Transportation needs:    Medical: Not on file    Non-medical: Not on file  Tobacco Use  . Smoking status: Never Smoker  . Smokeless tobacco: Never Used  Substance and Sexual Activity  . Alcohol use: No  . Drug use: No  . Sexual activity: Not on file  Lifestyle  . Physical activity:    Days per week: Not on file    Minutes per session: Not on file  . Stress: Not on file    Relationships  . Social connections:    Talks on phone: Not on file    Gets together: Not on file    Attends religious service: Not on file    Active member of club or organization: Not on file    Attends meetings of clubs or organizations: Not on file    Relationship status: Not on file  Other Topics Concern  . Not on file  Social History Narrative   REG EXERCISE   HAD COLONOSCOPY AND ENDOSCOPY DONE DATES UNKNOWN    Family History  Problem Relation Age of Onset  . Heart attack Father 42  . Alzheimer's disease Mother 70       TIAs  . Colon cancer Maternal Grandmother   . Heart attack Paternal Grandmother 82  . Hyperlipidemia Brother   . Hypertension Brother   . Diverticulitis Daughter        S/P colectomy  . Diabetes Neg Hx   . Stroke Neg Hx     Review of Systems  Constitutional: Negative for chills and fever.  HENT: Positive for congestion (mild) and sore throat. Negative for ear pain, postnasal drip, sinus pressure, sinus pain and sneezing.   Respiratory: Positive for shortness of breath (mild). Negative for cough and wheezing.   Neurological: Positive for light-headedness and headaches (intermittent).       Objective:   Vitals:   03/30/18 1125  BP: 130/84  Pulse: 92  Resp: 16  Temp: 98.2 F (36.8 C)  SpO2: 97%   BP Readings from Last 3 Encounters:  03/30/18 130/84  03/21/18 124/76  02/28/18 126/86   Wt Readings from Last 3 Encounters:  03/30/18 165 lb (74.8 kg)  03/21/18 161 lb (73 kg)  02/28/18 169 lb 6.4 oz (76.8 kg)   Body mass index is 25.84 kg/m.   Physical Exam    GENERAL APPEARANCE: Appears stated age, well appearing, NAD EYES: conjunctiva clear, no icterus HEENT: bilateral tympanic membranes and ear canals normal, oropharynx with no erythema, no thyromegaly, trachea midline, no cervical or  supraclavicular lymphadenopathy LUNGS: Clear to auscultation without wheeze or crackles, unlabored breathing, good air entry  bilaterally CARDIOVASCULAR: Normal S1,S2 without murmurs, no edema SKIN: Warm, dry      Assessment & Plan:    See Problem List for Assessment and Plan of chronic medical problems.

## 2018-03-30 ENCOUNTER — Encounter: Payer: Self-pay | Admitting: Internal Medicine

## 2018-03-30 ENCOUNTER — Ambulatory Visit (INDEPENDENT_AMBULATORY_CARE_PROVIDER_SITE_OTHER): Payer: Medicare HMO | Admitting: Internal Medicine

## 2018-03-30 VITALS — BP 130/84 | HR 92 | Temp 98.2°F | Resp 16 | Wt 165.0 lb

## 2018-03-30 DIAGNOSIS — J0110 Acute frontal sinusitis, unspecified: Secondary | ICD-10-CM | POA: Diagnosis not present

## 2018-03-30 DIAGNOSIS — M8580 Other specified disorders of bone density and structure, unspecified site: Secondary | ICD-10-CM | POA: Diagnosis not present

## 2018-03-30 MED ORDER — DENOSUMAB 60 MG/ML ~~LOC~~ SOSY: 60.0000 mg | PREFILLED_SYRINGE | Freq: Once | SUBCUTANEOUS | 0 refills | Status: DC

## 2018-03-30 MED ORDER — DENOSUMAB 60 MG/ML ~~LOC~~ SOSY
60.0000 mg | PREFILLED_SYRINGE | Freq: Once | SUBCUTANEOUS | Status: AC
  Administered 2018-03-30: 60 mg via SUBCUTANEOUS

## 2018-03-30 MED ORDER — AMOXICILLIN-POT CLAVULANATE 875-125 MG PO TABS: 1.0000 | ORAL_TABLET | Freq: Two times a day (BID) | ORAL | 0 refills | Status: DC

## 2018-03-30 NOTE — Assessment & Plan Note (Signed)
Due for Prolia-we will give today Continue calcium and vitamin D

## 2018-03-30 NOTE — Patient Instructions (Addendum)
Try switching back to the omeprazole 20 mg daily.   Continue the allegra, flonase and singulair.  It is ok to tylenol for the sinus pressure if needed.     If your symptoms do not improve over the next couple of days take the prescribed antibiotic.    You received the prolia injection today.

## 2018-03-30 NOTE — Assessment & Plan Note (Signed)
Symptoms consistent with viral sinusitis or allergic rhinitis Continue symptomatic treatment I will give her a prescription for an antibiotic-she is only to take this if her symptoms do not improve over the next few days-discussed that I do not think that she has a bacterial infection or need for antibiotic

## 2018-03-31 ENCOUNTER — Ambulatory Visit: Payer: Medicare HMO

## 2018-04-04 ENCOUNTER — Other Ambulatory Visit: Payer: Self-pay | Admitting: Cardiology

## 2018-04-19 ENCOUNTER — Ambulatory Visit: Payer: Self-pay

## 2018-04-19 NOTE — Telephone Encounter (Signed)
Phone call from pt. Reported she has had  Redness/ rash in quarter size area beneath right breast, and along the hysterectomy incision site.  Stated this started 2 days ago.  Also, reported cracks in the corners of her mouth.  Denied sores on the inside of her mouth, but stated my mouth doesn't feel quite right.  Reported using Vaseline to cracks in corner of mouth, and feels better.  Reported using cornstarch on "bikini" incision and this has also felt better.  Denied fever/ chills.  Questions if the redness/ rash is a reaction to Augmentin; completed antibiotic on Friday, 5/24.  Also, reported her vaginal area is somewhat tender, and noted small amt. of blood from external vaginal area with drying off.  Appt. sched. for tomorrow with PCP.  Pt. Agrees with plan.  Care advice given per protocol. Verb. Understanding.     Reason for Disposition . SEVERE itching (i.e., interferes with sleep, normal activities or school)  Answer Assessment - Initial Assessment Questions 1. APPEARANCE of RASH: "Describe the rash." (e.g., spots, blisters, raised areas, skin peeling, scaly)     Round red quarter size area under right breast, redness across the hysterectomy incision site, cracks in corners of mouth  2. SIZE: "How big are the spots?" (e.g., tip of pen, eraser, coin; inches, centimeters)     Solid red areas  3. LOCATION: "Where is the rash located?"    See above  4. COLOR: "What color is the rash?" (Note: It is difficult to assess rash color in people with darker-colored skin. When this situation occurs, simply ask the caller to describe what they see.)     red 5. ONSET: "When did the rash begin?"     Monday 6. FEVER: "Do you have a fever?" If so, ask: "What is your temperature, how was it measured, and when did it start?"     No fever  7. ITCHING: "Does the rash itch?" If so, ask: "How bad is the itch?" (Scale 1-10; or mild, moderate, severe)     C/o itching across hysterectomy incision site and under  right breast  8. CAUSE: "What do you think is causing the rash?"     Possible antibiotic; finished this on 5/24 9. MEDICATION FACTORS: "Have you started any new medications within the last 2 weeks?" (e.g., antibiotics)      On Augmentin x 10 days; finished on approx. 5/24 10. OTHER SYMPTOMS: "Do you have any other symptoms?" (e.g., dizziness, headache, sore throat, joint pain)       Tenderness in vaginal area; noted some streaks of red blood when dabbed to dry the area.  Denied itching  Protocols used: RASH OR REDNESS - [facility]

## 2018-04-19 NOTE — Progress Notes (Signed)
Subjective:    Patient ID: Valerie Rollins, female    DOB: Oct 22, 1936, 82 y.o.   MRN: [id number]  HPI The patient is here for an acute visit.  Took amoxicillin earlier this month for a sinus infection.   Throat feels different - it is not sore, but just feels different. The same with her tongue.  The sides of her mouth are dry and cracking, so are her lips.  She is using vaseline and that is helping a little.  She noticed a rash along her hysterectomy scar under her panus.  It is red and itchy. She has a similar rash under an area of her right breast.  Her vaginal area also feels irritated.  She denies vaginal discharge or urinary symptoms.  When she wiped the other day with her towel it looked like there was blood on her towel.  She has not seen it since.       Medications and allergies reviewed with patient and updated if appropriate.  Patient Active Problem List   Diagnosis Date Noted  . Acute non-recurrent frontal sinusitis 03/30/2018  . Seasonal allergic rhinitis due to pollen 03/21/2018  . Bilateral leg edema 12/31/2016  . Prediabetes 01/10/2016  . Osteopenia 12/26/2015  . Mass of neck 07/07/2015  . Encounter for therapeutic drug monitoring 05/14/2015  . Long-term (current) use of anticoagulants 04/16/2015  . S/P CABG x 1 04/01/2015  . CAD (coronary artery disease) 03/28/2015  . 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 Cardiomegaly 02/20/2015  . Hypotension, postural 06/07/2013  . DDD (degenerative disc disease), lumbar 07/20/2012  . Atrial fibrillation ([place]) 11/15/2011  . Syncope 11/12/2011  . Hypokalemia 11/12/2011  . RBBB (right bundle branch block) 11/12/2011  . HTN (hypertension), malignant 11/12/2011  . HOARSENESS 02/23/2010  . SKIN CANCER, HX OF 11/18/2009  . Diverticulosis of large intestine 10/31/2008  . HEART MURMUR, SYSTOLIC 22/12/5425  . VARICOSE VEINS, LOWER EXTREMITIES 03/13/2008  . GERD 02/20/2008  . IBS 02/20/2008  . OSTEOARTHRITIS 02/20/2008  . Hyperlipidemia  12/01/2006  . Essential hypertension 12/01/2006  . SUPERFICIAL THROMBOPHLEBITIS 12/01/2006  . DVT, HX OF 12/01/2006    Current Outpatient Medications on File Prior to Visit  Medication Sig Dispense Refill  . atorvastatin (LIPITOR) 10 MG tablet TAKE 1 TABLET BY MOUTH EVERY DAY 90 tablet 1  . calcium citrate-vitamin D (CITRACAL+D) 315-200 MG-UNIT per tablet Take 2 tablets by mouth 2 (two) times daily.    . Cholecalciferol (VITAMIN D3) 1000 UNITS CAPS Take 1,000 Units by mouth daily.     . Coenzyme Q10 (CO Q 10 PO) Take by mouth daily.    . COMBIVENT RESPIMAT 20-100 MCG/ACT AERS respimat Inhale 1 puff into the lungs as needed. 4 g 5  . dicyclomine (BENTYL) 20 MG tablet TAKE 1 TABLET (20 MG TOTAL) BY MOUTH EVERY 6 (SIX) HOURS AS NEEDED FOR SPASMS. 30 tablet 1  . ELIQUIS 5 MG TABS tablet TAKE 1 TABLET (5 MG TOTAL) BY MOUTH 2 (TWO) TIMES DAILY. 60 tablet 4  . fexofenadine (ALLEGRA) 180 MG tablet Take 180 mg by mouth daily. Pt takes daily    . furosemide (LASIX) 40 MG tablet TAKE 1 TABLET (40 MG TOTAL) BY MOUTH DAILY. 90 tablet 1  . latanoprost (XALATAN) 0.005 % ophthalmic solution Place 1 drop into both eyes daily.      Marland Kitchen losartan (COZAAR) 25 MG tablet TAKE 1 TABLET (25 MG TOTAL) BY MOUTH DAILY. 90 tablet 3  . Magnesium 400 MG CAPS Take 400 mg  by mouth every other day.     . metoprolol tartrate (LOPRESSOR) 25 MG tablet TAKE 1 AND 1/2 TABLET BY MOUTH TWICE A DAY 270 tablet 3  . montelukast (SINGULAIR) 10 MG tablet TAKE 1 TABLET AT BEDTIME 90 tablet 3  . Multiple Vitamin (MULITIVITAMIN WITH MINERALS) TABS Take 1 tablet by mouth daily. (Patient taking differently: Take 2 tablets by mouth daily. )    . omeprazole (PRILOSEC) 20 MG capsule TAKE ONE CAPSULE BY MOUTH EVERY DAY 90 capsule 1  . traMADol (ULTRAM) 50 MG tablet Take 1 tablet (50 mg total) by mouth every 6 (six) hours as needed. Dr.Ramos (Patient taking differently: Take 50 mg by mouth every 6 (six) hours as needed. Dr.Ramos; pt takes 1 tablet  every night, maybe 1 during the day as needed) 30 tablet 0   No current facility-administered medications on file prior to visit.     Past Medical History:  Diagnosis Date  . Ankle fracture, left 11/12/2011  . Atrial fibrillation ([place])    Post op after repair of ankle fracture  . 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 CAD (coronary artery disease) 03/2015   LAD 80%, otherwise minimal CAD. s/p LIMA-LAD  . Diverticulitis 2002   based on CT scan  . Diverticulosis    Dr Henrene Pastor  . DVT (deep venous thrombosis) ([place]) 2000   no trigger  . Endometriosis   . Gastritis 2006   @ Endo  . GERD (gastroesophageal reflux disease)   . Hiatal hernia   . Hyperlipidemia   . Hypertension   . IBS (irritable bowel syndrome)   . Ocular hypertension    glaucoma suspect, Dr. Kathrin Penner  . Skin cancer   . Superficial phlebitis     X 2, 1999, 2000  . Syncope 11/12/11   in context CAP . Carotid Doppler exam was negative. 2D echocardiogram revealed mild dilation of the left atrium and moderate increase in the systolic pressureof  the pulmonary artery  . UTI (lower urinary tract infection) 01/2013   Strep bovis ; PCN sensitive    Past Surgical History:  Procedure Laterality Date  . CARDIAC CATHETERIZATION N/A 03/28/2015   Procedure: Left Heart Cath and Coronary Angiography;  Surgeon: Jolaine Artist, MD;  Location: [place] INVASIVE CV LAB CUPID;  Service: Cardiovascular;  Laterality: N/A;  . CATARACT EXTRACTION Bilateral   . CATARACT EXTRACTION W/ INTRAOCULAR LENS IMPLANT     OS; Dr Kathrin Penner  . COLONOSCOPY  1995, 2002, 2012   diverticulosis; Dr Henrene Pastor  . CORONARY ARTERY BYPASS GRAFT N/A 04/01/2015   Procedure: CORONARY ARTERY BYPASS GRAFTING (CABG), ON PUMP, TIMES ONE, USING LEFT INTERNAL MAMMARY ARTERY;  Surgeon: Ivin Poot, MD;  Location: [place];  Service: Open Heart Surgery;  Laterality: N/A;  . EXCISION OF ATRIAL MYXOMA N/A 04/01/2015   Procedure: EXCISION OF  ATRIAL MYXOMA;  Surgeon: Ivin Poot, MD;  Location: [place];  Service: Open Heart Surgery;  Laterality: N/A;  . ORIF ANKLE FRACTURE  11/14/2011   Procedure: OPEN REDUCTION INTERNAL FIXATION (ORIF) ANKLE FRACTURE;  Surgeon: Valerie Rhein;  [facility];  Service: Orthopedics;  Laterality: Left;  open reduction internal fixation trimalleolar ankle fracture  . TEE WITHOUT CARDIOVERSION N/A 04/01/2015   Procedure: TRANSESOPHAGEAL ECHOCARDIOGRAM (TEE);  Surgeon: Ivin Poot, MD;  Location: [place];  Service: Open Heart Surgery;  Laterality: N/A;  . TONSILLECTOMY    . TOTAL ABDOMINAL HYSTERECTOMY W/ BILATERAL SALPINGOOPHORECTOMY  1980   dysfunctional menses, endometriosis  . [place], 2009    Social History   Socioeconomic History  . Marital status: Married    Spouse name: Not on file  . Number of children: 2  . Years of education: Not on file  . Highest education level: Not on file  Occupational History  . Occupation: Retired    Fish farm manager: RETIRED  Social Needs  . Financial resource strain: Not on file  . Food insecurity:    Worry: Not on file    Inability: Not on file  . Transportation needs:    Medical: Not on file    Non-medical: Not on file  Tobacco Use  . Smoking status: Never Smoker  . Smokeless tobacco: Never Used  Substance and Sexual Activity  . Alcohol use: No  . Drug use: No  . Sexual activity: Not on file  Lifestyle  . Physical activity:    Days per week: Not on file    Minutes per session: Not on file  . Stress: Not on file  Relationships  . Social connections:    Talks on phone: Not on file    Gets together: Not on file    Attends religious service: Not on file    Active member of club or organization: Not on file    Attends meetings of clubs or organizations: Not on file    Relationship status: Not on file  Other Topics Concern  . Not on file  Social History Narrative   REG EXERCISE   HAD COLONOSCOPY AND ENDOSCOPY  DONE DATES UNKNOWN    Family History  Problem Relation Age of Onset  . Heart attack Father 90  . Alzheimer's disease Mother 61       TIAs  . Colon cancer Maternal Grandmother   . Heart attack Paternal Grandmother 82  . Hyperlipidemia Brother   . Hypertension Brother   . Diverticulitis Daughter        S/P colectomy  . Diabetes Neg Hx   . Stroke Neg Hx     Review of Systems  HENT: Negative for mouth sores and sore throat.   Genitourinary: Negative for dysuria, frequency, hematuria, vaginal discharge and vaginal pain (tenderness).  Skin: Positive for rash.       Objective:   Vitals:   04/20/18 1128  BP: 134/88  Pulse: 83  Resp: 16  Temp: 98.1 F (36.7 C)  SpO2: 96%   BP Readings from Last 3 Encounters:  04/20/18 134/88  03/30/18 130/84  03/21/18 124/76   Wt Readings from Last 3 Encounters:  04/20/18 168 lb (76.2 kg)  03/30/18 165 lb (74.8 kg)  03/21/18 161 lb (73 kg)   Body mass index is 26.31 kg/m.   Physical Exam  Constitutional: She appears well-developed and well-nourished. No distress.  HENT:  Head: Normocephalic and atraumatic.  Tongue with white coating and beefy red appearance.  Lips beefy red   Genitourinary:  Genitourinary Comments: deferred  Musculoskeletal: She exhibits no edema.  Skin: Skin is warm and dry. Rash (under panus and under right breast - patches of erytheamtous rash with satellite lesions typical of a yeast infection, no open wounds or drainage) noted. She is not diaphoretic.           Assessment & Plan:    See Problem List for Assessment and Plan of chronic medical problems.

## 2018-04-20 ENCOUNTER — Ambulatory Visit (INDEPENDENT_AMBULATORY_CARE_PROVIDER_SITE_OTHER): Payer: Medicare HMO | Admitting: Internal Medicine

## 2018-04-20 ENCOUNTER — Encounter: Payer: Self-pay | Admitting: Internal Medicine

## 2018-04-20 VITALS — BP 134/88 | HR 83 | Temp 98.1°F | Resp 16 | Wt 168.0 lb

## 2018-04-20 DIAGNOSIS — B372 Candidiasis of skin and nail: Secondary | ICD-10-CM | POA: Diagnosis not present

## 2018-04-20 DIAGNOSIS — B370 Candidal stomatitis: Secondary | ICD-10-CM | POA: Diagnosis not present

## 2018-04-20 DIAGNOSIS — K130 Diseases of lips: Secondary | ICD-10-CM | POA: Diagnosis not present

## 2018-04-20 DIAGNOSIS — B3731 Acute candidiasis of vulva and vagina: Secondary | ICD-10-CM

## 2018-04-20 DIAGNOSIS — B373 Candidiasis of vulva and vagina: Secondary | ICD-10-CM | POA: Diagnosis not present

## 2018-04-20 MED ORDER — NYSTATIN 100000 UNIT/GM EX CREA: 1.0000 "application " | TOPICAL_CREAM | Freq: Two times a day (BID) | CUTANEOUS | 0 refills | Status: DC

## 2018-04-20 MED ORDER — FLUCONAZOLE 150 MG PO TABS: 150.0000 mg | ORAL_TABLET | Freq: Once | ORAL | 0 refills | Status: AC

## 2018-04-20 MED ORDER — NYSTATIN 100000 UNIT/ML MT SUSP
5.0000 mL | Freq: Four times a day (QID) | OROMUCOSAL | 0 refills | Status: DC
Start: 2018-04-20 — End: 2018-07-10

## 2018-04-20 NOTE — Assessment & Plan Note (Addendum)
Diflucan 150 mg x 1 If bleeding persists may need gyn eval - likely related to yeast infection and vaginal atrophy

## 2018-04-20 NOTE — Patient Instructions (Addendum)
Take the pill once.  Use the solution for 7-10 days  Use the cream twice a day - use for a few days after the rash resolves.     Skin Yeast Infection Skin yeast infection is a condition in which there is an overgrowth of yeast (candida) that normally lives on the skin. This condition usually occurs in areas of the skin that are constantly warm and moist, such as the armpits or the groin. What are the causes? This condition is caused by a change in the normal balance of the yeast and bacteria that live on the skin. What increases the risk? This condition is more likely to develop in:  People who are obese.  Pregnant women.  Women who take birth control pills.  People who have diabetes.  People who take antibiotic medicines.  People who take steroid medicines.  People who are malnourished.  People who have a weak defense (immune) system.  People who are 64 years of age or older.  What are the signs or symptoms? Symptoms of this condition include:  A red, swollen area of the skin.  Bumps on the skin.  Itchiness.  How is this diagnosed? This condition is diagnosed with a medical history and physical exam. Your health care provider may check for yeast by taking light scrapings of the skin to be viewed under a microscope. How is this treated? This condition is treated with medicine. Medicines may be prescribed or be available over-the-counter. The medicines may be:  Taken by mouth (orally).  Applied as a cream.  Follow these instructions at home:  Take or apply over-the-counter and prescription medicines only as told by your health care provider.  Eat more yogurt. This may help to keep your yeast infection from returning.  Maintain a healthy weight. If you need help losing weight, talk with your health care provider.  Keep your skin clean and dry.  If you have diabetes, keep your blood sugar under control. Contact a health care provider if:  Your symptoms go  away and then return.  Your symptoms do not get better with treatment.  Your symptoms get worse.  Your rash spreads.  You have a fever or chills.  You have new symptoms.  You have new warmth or redness of your skin. This information is not intended to replace advice given to you by your health care provider. Make sure you discuss any questions you have with your health care provider. Document Released: 07/27/2011 Document Revised: 07/04/2016 Document Reviewed: 05/12/2015 Elsevier Interactive Patient Education  [employer].

## 2018-04-20 NOTE — Assessment & Plan Note (Signed)
Diflucan 150 mg x 1 Nystatin S & S

## 2018-04-20 NOTE — Assessment & Plan Note (Signed)
Diflucan 150 mg x 1 Can apply nystatin cream Ok to continue Vaseline

## 2018-04-20 NOTE — Assessment & Plan Note (Signed)
Given diffuse yeast infections -  Diflucan 150 mg x 1 Nystatin cream BID - advised to use for a few days after rash resolves

## 2018-04-24 DIAGNOSIS — H26491 Other secondary cataract, right eye: Secondary | ICD-10-CM | POA: Diagnosis not present

## 2018-04-24 DIAGNOSIS — H43813 Vitreous degeneration, bilateral: Secondary | ICD-10-CM | POA: Diagnosis not present

## 2018-04-24 DIAGNOSIS — H401131 Primary open-angle glaucoma, bilateral, mild stage: Secondary | ICD-10-CM | POA: Diagnosis not present

## 2018-04-24 DIAGNOSIS — H5310 Unspecified subjective visual disturbances: Secondary | ICD-10-CM | POA: Diagnosis not present

## 2018-05-01 ENCOUNTER — Telehealth: Payer: Self-pay | Admitting: Cardiology

## 2018-05-01 DIAGNOSIS — E7800 Pure hypercholesterolemia, unspecified: Secondary | ICD-10-CM

## 2018-05-01 NOTE — Telephone Encounter (Signed)
New message   Pt c/o medication issue:  1. Name of Medication: atorvastatin (LIPITOR) 10 MG tablet  2. How are you currently taking this medication (dosage and times per day)?   3. Are you having a reaction (difficulty breathing--STAT)? No  4. What is your medication issue? Patient wants to know if she needs to start taking medication again.

## 2018-05-01 NOTE — Telephone Encounter (Signed)
Spoke with pt, she is doing better off of the atorvastatin, the cramps have gotten better. She has not taken any of the other statins. Will forward for dr Stanford Breed review

## 2018-05-02 MED ORDER — ROSUVASTATIN CALCIUM 10 MG PO TABS: 10.0000 mg | ORAL_TABLET | Freq: Every day | ORAL | 6 refills | Status: DC

## 2018-05-02 NOTE — Telephone Encounter (Addendum)
Spoke with pt, Aware of dr crenshaw's recommendations. New script sent to the pharmacy and Lab orders mailed to the pt  

## 2018-05-02 NOTE — Telephone Encounter (Signed)
Try crestor 10 mg daily, lipids and liver 4 weeks Kirk Ruths

## 2018-05-03 ENCOUNTER — Other Ambulatory Visit: Payer: Self-pay | Admitting: Internal Medicine

## 2018-05-09 ENCOUNTER — Other Ambulatory Visit: Payer: Self-pay | Admitting: Emergency Medicine

## 2018-05-09 MED ORDER — COMBIVENT RESPIMAT 20-100 MCG/ACT IN AERS: 1.0000 | INHALATION_SPRAY | RESPIRATORY_TRACT | 5 refills | Status: DC | PRN

## 2018-05-11 DIAGNOSIS — H26491 Other secondary cataract, right eye: Secondary | ICD-10-CM | POA: Diagnosis not present

## 2018-05-15 DIAGNOSIS — M545 Low back pain: Secondary | ICD-10-CM | POA: Diagnosis not present

## 2018-05-22 DIAGNOSIS — R69 Illness, unspecified: Secondary | ICD-10-CM | POA: Diagnosis not present

## 2018-06-06 DIAGNOSIS — E7800 Pure hypercholesterolemia, unspecified: Secondary | ICD-10-CM | POA: Diagnosis not present

## 2018-06-06 LAB — HEPATIC FUNCTION PANEL
ALT: 24 IU/L (ref 0–32)
AST: 33 IU/L (ref 0–40)
Albumin: 4.5 g/dL (ref 3.5–4.8)
Alkaline Phosphatase: 57 IU/L (ref 39–117)
Bilirubin Total: 0.8 mg/dL (ref 0.0–1.2)
Bilirubin, Direct: 0.23 mg/dL (ref 0.00–0.40)
TOTAL PROTEIN: 6.7 g/dL (ref 6.0–8.5)

## 2018-06-06 LAB — LIPID PANEL
CHOL/HDL RATIO: 1.8 ratio (ref 0.0–4.4)
Cholesterol, Total: 168 mg/dL (ref 100–199)
HDL: 93 mg/dL (ref >39)
LDL Calculated: 63 mg/dL (ref 0–99)
Triglycerides: 60 mg/dL (ref 0–149)
VLDL CHOLESTEROL CAL: 12 mg/dL (ref 5–40)

## 2018-06-07 ENCOUNTER — Encounter: Payer: Self-pay | Admitting: *Deleted

## 2018-07-03 ENCOUNTER — Other Ambulatory Visit: Payer: Self-pay | Admitting: Internal Medicine

## 2018-07-04 ENCOUNTER — Other Ambulatory Visit: Payer: Self-pay | Admitting: Cardiology

## 2018-07-05 DIAGNOSIS — M542 Cervicalgia: Secondary | ICD-10-CM | POA: Diagnosis not present

## 2018-07-05 DIAGNOSIS — M545 Low back pain: Secondary | ICD-10-CM | POA: Diagnosis not present

## 2018-07-05 DIAGNOSIS — M9903 Segmental and somatic dysfunction of lumbar region: Secondary | ICD-10-CM | POA: Diagnosis not present

## 2018-07-05 DIAGNOSIS — M9901 Segmental and somatic dysfunction of cervical region: Secondary | ICD-10-CM | POA: Diagnosis not present

## 2018-07-05 DIAGNOSIS — M9904 Segmental and somatic dysfunction of sacral region: Secondary | ICD-10-CM | POA: Diagnosis not present

## 2018-07-05 DIAGNOSIS — M62830 Muscle spasm of back: Secondary | ICD-10-CM | POA: Diagnosis not present

## 2018-07-07 ENCOUNTER — Telehealth: Payer: Self-pay | Admitting: Cardiology

## 2018-07-07 NOTE — Telephone Encounter (Signed)
Called patient to verify that she was taking her medications.   Patient is now on rosuvastatin due to having issues with the other.   Patient is not due to refill the losartan until October of this year.   Patient has had no issues with her medications.

## 2018-07-07 NOTE — Telephone Encounter (Signed)
New problem    FYI.Marland KitchenMarland KitchenAetna wanted to let the doctor know that the patient has been late on refilling her atorvastatin losartan medications.

## 2018-07-09 NOTE — Patient Instructions (Addendum)
  Test(s) ordered today. Your results will be released to [place] (or called to you) after review, usually within 72hours after test completion. If any changes need to be made, you will be notified at that same time.  Medications reviewed and updated.  Changes include starting diflucan daily for two weeks.   Your prescription(s) have been submitted to your pharmacy. Please take as directed and contact our office if you believe you are having problem(s) with the medication(s).    Please followup in 6 months

## 2018-07-09 NOTE — Progress Notes (Signed)
Subjective:    Patient ID: Valerie Rollins, female    DOB: January 30, 1936, 82 y.o.   MRN: [id number]  HPI The patient is here for follow up.  Prediabetes:  She is not compliant with a low sugar/carbohydrate diet.  She is not exercising regularly.  CAD, Afib, Hypertension: She is taking her medication daily. She is compliant with a low sodium diet.  She denies chest pain, palpitations, edema, shortness of breath and regular headaches. She is not exercising regularly.  She does not monitor her blood pressure at home.    Hyperlipidemia: She is taking her medication daily. She is compliant with a low fat/cholesterol diet. She is not exercising regularly. She denies myalgias.   GERD:  She is taking her medication daily as prescribed.  She denies any GERD symptoms and feels her GERD is well controlled.   Edema, b/l LE:  She takes lasix 40 mg daily.  She is compliant with a low sodium diet and is not exercises regularly.   Skin rash - she had candidiasis on her lower abdomen and we treated it with nystatin.    Thrush:  We treated her in may and it cleared it up, but her mouth still feels funny.   Medications and allergies reviewed with patient and updated if appropriate.  Patient Active Problem List   Diagnosis Date Noted  . Skin yeast infection 04/20/2018  . Thrush 04/20/2018  . Angular stomatitis 04/20/2018  . Acute non-recurrent frontal sinusitis 03/30/2018  . Seasonal allergic rhinitis due to pollen 03/21/2018  . Bilateral leg edema 12/31/2016  . Prediabetes 01/10/2016  . Osteopenia 12/26/2015  . Mass of neck 07/07/2015  . Encounter for therapeutic drug monitoring 05/14/2015  . Long-term (current) use of anticoagulants 04/16/2015  . S/P CABG x 1 04/01/2015  . CAD (coronary artery disease) 03/28/2015  . 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 Cardiomegaly 02/20/2015  . DDD (degenerative disc disease), lumbar 07/20/2012  . Atrial fibrillation ([place]) 11/15/2011  . Syncope 11/12/2011  .  Hypokalemia 11/12/2011  . RBBB (right bundle branch block) 11/12/2011  . HTN (hypertension), malignant 11/12/2011  . HOARSENESS 02/23/2010  . SKIN CANCER, HX OF 11/18/2009  . Diverticulosis of large intestine 10/31/2008  . HEART MURMUR, SYSTOLIC 76/54/6503  . VARICOSE VEINS, LOWER EXTREMITIES 03/13/2008  . GERD 02/20/2008  . IBS 02/20/2008  . OSTEOARTHRITIS 02/20/2008  . Hyperlipidemia 12/01/2006  . Essential hypertension 12/01/2006  . SUPERFICIAL THROMBOPHLEBITIS 12/01/2006  . DVT, HX OF 12/01/2006    Current Outpatient Medications on File Prior to Visit  Medication Sig Dispense Refill  . calcium citrate-vitamin D (CITRACAL+D) 315-200 MG-UNIT per tablet Take 2 tablets by mouth 2 (two) times daily.    . Cholecalciferol (VITAMIN D3) 1000 UNITS CAPS Take 1,000 Units by mouth daily.     . Coenzyme Q10 (CO Q 10 PO) Take by mouth daily.    . COMBIVENT RESPIMAT 20-100 MCG/ACT AERS respimat Inhale 1 puff into the lungs as needed. 4 g 5  . dicyclomine (BENTYL) 20 MG tablet TAKE 1 TABLET (20 MG TOTAL) BY MOUTH EVERY 6 (SIX) HOURS AS NEEDED FOR SPASMS. 30 tablet 1  . ELIQUIS 5 MG TABS tablet TAKE 1 TABLET (5 MG TOTAL) BY MOUTH 2 (TWO) TIMES DAILY. 60 tablet 2  . fexofenadine (ALLEGRA) 180 MG tablet Take 180 mg by mouth daily. Pt takes daily    . furosemide (LASIX) 40 MG tablet TAKE 1 TABLET (40 MG TOTAL) BY MOUTH DAILY. 90 tablet 1  . latanoprost (XALATAN)  0.005 % ophthalmic solution Place 1 drop into both eyes daily.      Marland Kitchen losartan (COZAAR) 25 MG tablet TAKE 1 TABLET (25 MG TOTAL) BY MOUTH DAILY. 90 tablet 3  . Magnesium 400 MG CAPS Take 400 mg by mouth every other day.     . metoprolol tartrate (LOPRESSOR) 25 MG tablet TAKE 1 AND 1/2 TABLET BY MOUTH TWICE A DAY 270 tablet 3  . montelukast (SINGULAIR) 10 MG tablet TAKE 1 TABLET AT BEDTIME 90 tablet 3  . Multiple Vitamin (MULITIVITAMIN WITH MINERALS) TABS Take 1 tablet by mouth daily. (Patient taking differently: Take 2 tablets by mouth daily.  )    . nystatin cream (MYCOSTATIN) Apply 1 application topically 2 (two) times daily. 30 g 0  . omeprazole (PRILOSEC) 20 MG capsule TAKE ONE CAPSULE BY MOUTH EVERY DAY 90 capsule 1  . rosuvastatin (CRESTOR) 10 MG tablet Take 1 tablet (10 mg total) by mouth daily. 30 tablet 6  . traMADol (ULTRAM) 50 MG tablet Take 1 tablet (50 mg total) by mouth every 6 (six) hours as needed. Dr.Ramos (Patient taking differently: Take 50 mg by mouth every 6 (six) hours as needed. Dr.Ramos; pt takes 1 tablet every night, maybe 1 during the day as needed) 30 tablet 0   No current facility-administered medications on file prior to visit.     Past Medical History:  Diagnosis Date  . Ankle fracture, left 11/12/2011  . Atrial fibrillation ([place])    Post op after repair of ankle fracture  . 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 CAD (coronary artery disease) 03/2015   LAD 80%, otherwise minimal CAD. s/p LIMA-LAD  . Diverticulitis 2002   based on CT scan  . Diverticulosis    Dr Henrene Pastor  . DVT (deep venous thrombosis) ([place]) 2000   no trigger  . Endometriosis   . Gastritis 2006   @ Endo  . GERD (gastroesophageal reflux disease)   . Hiatal hernia   . Hyperlipidemia   . Hypertension   . IBS (irritable bowel syndrome)   . Ocular hypertension    glaucoma suspect, Dr. Kathrin Penner  . Skin cancer   . Superficial phlebitis     X 2, 1999, 2000  . Syncope 11/12/11   in context CAP . Carotid Doppler exam was negative. 2D echocardiogram revealed mild dilation of the left atrium and moderate increase in the systolic pressureof  the pulmonary artery  . UTI (lower urinary tract infection) 01/2013   Strep bovis ; PCN sensitive    Past Surgical History:  Procedure Laterality Date  . CARDIAC CATHETERIZATION N/A 03/28/2015   Procedure: Left Heart Cath and Coronary Angiography;  Surgeon: Jolaine Artist, MD;  Location: [facility];  Service: Cardiovascular;  Laterality:  N/A;  . CATARACT EXTRACTION Bilateral   . CATARACT EXTRACTION W/ INTRAOCULAR LENS IMPLANT     OS; Dr Kathrin Penner  . COLONOSCOPY  1995, 2002, 2012   diverticulosis; Dr Henrene Pastor  . CORONARY ARTERY BYPASS GRAFT N/A 04/01/2015   Procedure: CORONARY ARTERY BYPASS GRAFTING (CABG), ON PUMP, TIMES ONE, USING LEFT INTERNAL MAMMARY ARTERY;  Surgeon: Ivin Poot, MD;  Location: [facility];  Service: Open Heart Surgery;  Laterality: N/A;  . EXCISION OF ATRIAL MYXOMA N/A 04/01/2015   Procedure: EXCISION OF ATRIAL MYXOMA;  Surgeon: Ivin Poot, MD;  Location: [facility];  Service: Open Heart Surgery;  Laterality: N/A;  . ORIF ANKLE FRACTURE  11/14/2011   Procedure: OPEN REDUCTION INTERNAL FIXATION (ORIF) ANKLE FRACTURE;  Surgeon: Valerie Rhein;  Location: [facility];  Service: Orthopedics;  Laterality: Left;  open reduction internal fixation trimalleolar ankle fracture  . TEE WITHOUT CARDIOVERSION N/A 04/01/2015   Procedure: TRANSESOPHAGEAL ECHOCARDIOGRAM (TEE);  Surgeon: Ivin Poot, MD;  Location: [facility];  Service: Open Heart Surgery;  Laterality: N/A;  . TONSILLECTOMY    . TOTAL ABDOMINAL HYSTERECTOMY W/ BILATERAL SALPINGOOPHORECTOMY  1980   dysfunctional menses, endometriosis  . [place], 2009    Social History   Socioeconomic History  . Marital status: Married    Spouse name: Not on file  . Number of children: 2  . Years of education: Not on file  . Highest education level: Not on file  Occupational History  . Occupation: Retired    Fish farm manager: RETIRED  Social Needs  . Financial resource strain: Not on file  . Food insecurity:    Worry: Not on file    Inability: Not on file  . Transportation needs:    Medical: Not on file    Non-medical: Not on file  Tobacco Use  . Smoking status: Never Smoker  . Smokeless tobacco: Never Used  Substance and Sexual Activity  . Alcohol use: No  . Drug use: No  . Sexual activity: Not on file  Lifestyle  . Physical activity:     Days per week: Not on file    Minutes per session: Not on file  . Stress: Not on file  Relationships  . Social connections:    Talks on phone: Not on file    Gets together: Not on file    Attends religious service: Not on file    Active member of club or organization: Not on file    Attends meetings of clubs or organizations: Not on file    Relationship status: Not on file  Other Topics Concern  . Not on file  Social History Narrative   REG EXERCISE   HAD COLONOSCOPY AND ENDOSCOPY DONE DATES UNKNOWN    Family History  Problem Relation Age of Onset  . Heart attack Father 75  . Alzheimer's disease Mother 86       TIAs  . Colon cancer Maternal Grandmother   . Heart attack Paternal Grandmother 82  . Hyperlipidemia Brother   . Hypertension Brother   . Diverticulitis Daughter        S/P colectomy  . Diabetes Neg Hx   . Stroke Neg Hx     Review of Systems  Constitutional: Negative for chills and fever.  HENT: Negative for sore throat (irritated) and trouble swallowing.   Respiratory: Negative for cough, shortness of breath and wheezing.   Cardiovascular: Negative for chest pain and palpitations.  Skin: Positive for rash.  Neurological: Negative for light-headedness and headaches.       Objective:   Vitals:   07/10/18 1326  BP: (!) 146/82  Pulse: 83  Resp: 16  Temp: 97.6 F (36.4 C)  SpO2: 96%   BP Readings from Last 3 Encounters:  07/10/18 (!) 146/82  04/20/18 134/88  03/30/18 130/84   Wt Readings from Last 3 Encounters:  07/10/18 170 lb (77.1 kg)  04/20/18 168 lb (76.2 kg)  03/30/18 165 lb (74.8 kg)   Body mass index is 26.63 kg/m.   Physical Exam    Constitutional: Appears well-developed and well-nourished. No distress.  HENT:  Head: Normocephalic and atraumatic.  Neck:  Neck supple. No tracheal deviation present. No thyromegaly present.  No cervical lymphadenopathy Cardiovascular: Normal rate, irregular rhythm and normal heart sounds.   No murmur  heard. No carotid bruit .  2+ b/l LE edema Pulmonary/Chest: Effort normal and breath sounds normal. No respiratory distress. No has no wheezes. No rales.  Skin: Skin is warm and dry. Not diaphoretic.  Psychiatric: Normal mood and affect. Behavior is normal.      Assessment & Plan:    See Problem List for Assessment and Plan of chronic medical problems.

## 2018-07-10 ENCOUNTER — Encounter: Payer: Self-pay | Admitting: Internal Medicine

## 2018-07-10 ENCOUNTER — Other Ambulatory Visit (INDEPENDENT_AMBULATORY_CARE_PROVIDER_SITE_OTHER): Payer: Medicare HMO

## 2018-07-10 ENCOUNTER — Ambulatory Visit (INDEPENDENT_AMBULATORY_CARE_PROVIDER_SITE_OTHER): Payer: Medicare HMO | Admitting: Internal Medicine

## 2018-07-10 VITALS — BP 146/82 | HR 83 | Temp 97.6°F | Resp 16 | Ht 67.0 in | Wt 170.0 lb

## 2018-07-10 DIAGNOSIS — K219 Gastro-esophageal reflux disease without esophagitis: Secondary | ICD-10-CM

## 2018-07-10 DIAGNOSIS — I482 Chronic atrial fibrillation: Secondary | ICD-10-CM

## 2018-07-10 DIAGNOSIS — R7303 Prediabetes: Secondary | ICD-10-CM

## 2018-07-10 DIAGNOSIS — I2583 Coronary atherosclerosis due to lipid rich plaque: Secondary | ICD-10-CM | POA: Diagnosis not present

## 2018-07-10 DIAGNOSIS — I4821 Permanent atrial fibrillation: Secondary | ICD-10-CM

## 2018-07-10 DIAGNOSIS — B372 Candidiasis of skin and nail: Secondary | ICD-10-CM

## 2018-07-10 DIAGNOSIS — I2510 Atherosclerotic heart disease of native coronary artery without angina pectoris: Secondary | ICD-10-CM

## 2018-07-10 DIAGNOSIS — E7800 Pure hypercholesterolemia, unspecified: Secondary | ICD-10-CM | POA: Diagnosis not present

## 2018-07-10 DIAGNOSIS — R600 Localized edema: Secondary | ICD-10-CM

## 2018-07-10 DIAGNOSIS — B370 Candidal stomatitis: Secondary | ICD-10-CM | POA: Diagnosis not present

## 2018-07-10 DIAGNOSIS — I10 Essential (primary) hypertension: Secondary | ICD-10-CM | POA: Diagnosis not present

## 2018-07-10 LAB — CBC WITH DIFFERENTIAL/PLATELET
BASOS ABS: 0 10*3/uL (ref 0.0–0.1)
Basophils Relative: 0.7 % (ref 0.0–3.0)
EOS ABS: 0.1 10*3/uL (ref 0.0–0.7)
Eosinophils Relative: 1.7 % (ref 0.0–5.0)
HEMATOCRIT: 42.6 % (ref 36.0–46.0)
HEMOGLOBIN: 14.4 g/dL (ref 12.0–15.0)
LYMPHS PCT: 23.8 % (ref 12.0–46.0)
Lymphs Abs: 1.5 10*3/uL (ref 0.7–4.0)
MCHC: 33.9 g/dL (ref 30.0–36.0)
MCV: 88.1 fl (ref 78.0–100.0)
MONO ABS: 0.5 10*3/uL (ref 0.1–1.0)
Monocytes Relative: 7.5 % (ref 3.0–12.0)
Neutro Abs: 4.1 10*3/uL (ref 1.4–7.7)
Neutrophils Relative %: 66.3 % (ref 43.0–77.0)
PLATELETS: 206 10*3/uL (ref 150.0–400.0)
RBC: 4.83 Mil/uL (ref 3.87–5.11)
RDW: 13 % (ref 11.5–15.5)
WBC: 6.2 10*3/uL (ref 4.0–10.5)

## 2018-07-10 LAB — COMPREHENSIVE METABOLIC PANEL
ALBUMIN: 4.4 g/dL (ref 3.5–5.2)
ALT: 22 U/L (ref 0–35)
AST: 28 U/L (ref 0–37)
Alkaline Phosphatase: 50 U/L (ref 39–117)
BILIRUBIN TOTAL: 0.9 mg/dL (ref 0.2–1.2)
BUN: 28 mg/dL — AB (ref 6–23)
CALCIUM: 10.3 mg/dL (ref 8.4–10.5)
CO2: 31 mEq/L (ref 19–32)
CREATININE: 1.08 mg/dL (ref 0.40–1.20)
Chloride: 102 mEq/L (ref 96–112)
GFR: 51.89 mL/min — ABNORMAL LOW (ref >60.00)
Glucose, Bld: 89 mg/dL (ref 70–99)
Potassium: 3.8 mEq/L (ref 3.5–5.1)
SODIUM: 140 meq/L (ref 135–145)
Total Protein: 7 g/dL (ref 6.0–8.3)

## 2018-07-10 LAB — HEMOGLOBIN A1C: Hgb A1c MFr Bld: 5.9 % (ref 4.6–6.5)

## 2018-07-10 MED ORDER — FLUCONAZOLE 200 MG PO TABS: 200.0000 mg | ORAL_TABLET | Freq: Every day | ORAL | 0 refills | Status: DC

## 2018-07-10 NOTE — Assessment & Plan Note (Signed)
Check a1c Low sugar / carb diet Stressed regular exercise   

## 2018-07-10 NOTE — Assessment & Plan Note (Signed)
Chronic, stable Continue lasix at current dose

## 2018-07-10 NOTE — Assessment & Plan Note (Signed)
Nystatin cream prn Fluconazole orally for thrush/ possible esophageal involvement

## 2018-07-10 NOTE — Assessment & Plan Note (Signed)
Still has some thrush with possible esophageal involvement Will give diflucan x 2 weeks Continue probiotic Discussed slight increase in risk of bleeding since she is on eliquis

## 2018-07-10 NOTE — Assessment & Plan Note (Signed)
GERD controlled Continue daily medication  

## 2018-07-10 NOTE — Assessment & Plan Note (Signed)
Lipids just checked by cardio - well controlled Continue daily statin Regular exercise and healthy diet encouraged

## 2018-07-10 NOTE — Assessment & Plan Note (Addendum)
BP slightly elevated here today, but was well controlled earlier this year Continue current medications Start exercising cmp

## 2018-07-10 NOTE — Assessment & Plan Note (Signed)
No chest pain, sob, palps Continue current medications

## 2018-07-10 NOTE — Assessment & Plan Note (Signed)
Rate controlled Asymptomatic On eliquis, metoprolol cbc

## 2018-07-11 ENCOUNTER — Telehealth: Payer: Self-pay | Admitting: Internal Medicine

## 2018-07-11 NOTE — Telephone Encounter (Signed)
Pt returned call  Copied from [place] [id number]. Topic: Quick Communication - Lab Results >> Jul 11, 2018 11:19 AM Valerie Rollins, CMA wrote: Left message for pt to call back re: labs. [facility] may inform patient of results.

## 2018-07-11 NOTE — Telephone Encounter (Signed)
Charted in result notes. 

## 2018-07-31 ENCOUNTER — Encounter: Payer: Self-pay | Admitting: Cardiology

## 2018-08-02 ENCOUNTER — Telehealth: Payer: Self-pay | Admitting: Internal Medicine

## 2018-08-02 NOTE — Telephone Encounter (Signed)
Insurance has been submitted and verified for Prolia. Patient is responsible for a $255 copay. Due on or after 10/01/2018. Left message for patient to call back to schedule.  Okay to schedule... Visit Note: Prolia ($255 copay - okay to give per Gareth Eagle) Visit Type: Nurse Provider: Nurse

## 2018-08-07 NOTE — Progress Notes (Signed)
HPI: FU CAD and atrial myxoma resection. Echo 4/16 orderded by primary care for cardiomegaly; this revealed normal LV function, left atrial mass; mild MR. Cardiac cath 5/16 showed 80 LAD. Preop carotid dopplers 5/16 with no significant stenosis. Patient had CABG with LIMA to LAD, resection of atrial myxoma and LAA oversewn. Patient had recurrent atrial fibrillation in September 2016. She has been treated with rate controlling medications and anticoagulation. EchocardiogramSeptember 2017 showed normal LV systolic function, mild mitral regurgitation, mild left atrial enlargement, moderate tricuspid regurgitation and moderately elevated pulmonary pressures. No recurrent myxoma.Holter 4/18 showed atrial fibrillation, rate mildly decreased late PM and early AM but otherwise controlled.Since last seen, the patient has dyspnea with more extreme activities but not with routine activities. It is relieved with rest. It is not associated with chest pain. There is no orthopnea, PND or pedal edema. There is no syncope or palpitations. There is no exertional chest pain.   Current Outpatient Medications  Medication Sig Dispense Refill  . calcium citrate-vitamin D (CITRACAL+D) 315-200 MG-UNIT per tablet Take 2 tablets by mouth 2 (two) times daily.    . Cholecalciferol (VITAMIN D3) 1000 UNITS CAPS Take 1,000 Units by mouth daily.     . Coenzyme Q10 (CO Q 10 PO) Take by mouth daily.    . COMBIVENT RESPIMAT 20-100 MCG/ACT AERS respimat Inhale 1 puff into the lungs as needed. 4 g 5  . dicyclomine (BENTYL) 20 MG tablet TAKE 1 TABLET (20 MG TOTAL) BY MOUTH EVERY 6 (SIX) HOURS AS NEEDED FOR SPASMS. 30 tablet 1  . ELIQUIS 5 MG TABS tablet TAKE 1 TABLET (5 MG TOTAL) BY MOUTH 2 (TWO) TIMES DAILY. 60 tablet 2  . fexofenadine (ALLEGRA) 180 MG tablet Take 180 mg by mouth daily. Pt takes daily    . fluconazole (DIFLUCAN) 200 MG tablet Take 1 tablet (200 mg total) by mouth daily. 14 tablet 0  . furosemide (LASIX) 40 MG  tablet TAKE 1 TABLET (40 MG TOTAL) BY MOUTH DAILY. 90 tablet 1  . latanoprost (XALATAN) 0.005 % ophthalmic solution Place 1 drop into both eyes daily.      Marland Kitchen losartan (COZAAR) 25 MG tablet TAKE 1 TABLET (25 MG TOTAL) BY MOUTH DAILY. 90 tablet 3  . Magnesium 400 MG CAPS Take 400 mg by mouth every other day.     . metoprolol tartrate (LOPRESSOR) 25 MG tablet TAKE 1 AND 1/2 TABLET BY MOUTH TWICE A DAY 270 tablet 3  . montelukast (SINGULAIR) 10 MG tablet TAKE 1 TABLET AT BEDTIME 90 tablet 3  . Multiple Vitamin (MULITIVITAMIN WITH MINERALS) TABS Take 1 tablet by mouth daily. (Patient taking differently: Take 2 tablets by mouth daily. )    . nystatin cream (MYCOSTATIN) Apply 1 application topically 2 (two) times daily. 30 g 0  . omeprazole (PRILOSEC) 20 MG capsule TAKE ONE CAPSULE BY MOUTH EVERY DAY 90 capsule 1  . traMADol (ULTRAM) 50 MG tablet Take 1 tablet (50 mg total) by mouth every 6 (six) hours as needed. Dr.Ramos (Patient taking differently: Take 50 mg by mouth every 6 (six) hours as needed. Dr.Ramos; pt takes 1 tablet every night, maybe 1 during the day as needed) 30 tablet 0  . rosuvastatin (CRESTOR) 10 MG tablet Take 1 tablet (10 mg total) by mouth daily. 30 tablet 6   No current facility-administered medications for this visit.      Past Medical History:  Diagnosis Date  . Ankle fracture, left 11/12/2011  . Atrial fibrillation (  [place])    Post op after repair of ankle fracture  . 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 CAD (coronary artery disease) 03/2015   LAD 80%, otherwise minimal CAD. s/p LIMA-LAD  . Diverticulitis 2002   based on CT scan  . Diverticulosis    Dr Henrene Pastor  . DVT (deep venous thrombosis) ([place]) 2000   no trigger  . Endometriosis   . Gastritis 2006   @ Endo  . GERD (gastroesophageal reflux disease)   . Hiatal hernia   . Hyperlipidemia   . Hypertension   . IBS (irritable bowel syndrome)   . Ocular hypertension    glaucoma  suspect, Dr. Kathrin Penner  . Skin cancer   . Superficial phlebitis     X 2, 1999, 2000  . Syncope 11/12/11   in context CAP . Carotid Doppler exam was negative. 2D echocardiogram revealed mild dilation of the left atrium and moderate increase in the systolic pressureof  the pulmonary artery  . UTI (lower urinary tract infection) 01/2013   Strep bovis ; PCN sensitive    Past Surgical History:  Procedure Laterality Date  . CARDIAC CATHETERIZATION N/A 03/28/2015   Procedure: Left Heart Cath and Coronary Angiography;  Surgeon: Jolaine Artist, MD;  Location: [facility]ASIVE [facility];  Service: Cardiovascular;  Laterality: N/A;  . CATARACT EXTRACTION Bilateral   . CATARACT EXTRACTION W/ INTRAOCULAR LENS IMPLANT     OS; Dr Kathrin Penner  . COLONOSCOPY  1995, 2002, 2012   diverticulosis; Dr Henrene Pastor  . CORONARY ARTERY BYPASS GRAFT N/A 04/01/2015   Procedure: CORONARY ARTERY BYPASS GRAFTING (CABG), ON PUMP, TIMES ONE, USING LEFT INTERNAL MAMMARY ARTERY;  Surgeon: Ivin Poot, MD;  Location: [facility];  Service: Open Heart Surgery;  Laterality: N/A;  . EXCISION OF ATRIAL MYXOMA N/A 04/01/2015   Procedure: EXCISION OF ATRIAL MYXOMA;  Surgeon: Ivin Poot, MD;  Location: [facility];  Service: Open Heart Surgery;  Laterality: N/A;  . ORIF ANKLE FRACTURE  11/14/2011   Procedure: OPEN REDUCTION INTERNAL FIXATION (ORIF) ANKLE FRACTURE;  Surgeon: Colin Rhein;  Location: [facility];  Service: Orthopedics;  Laterality: Left;  open reduction internal fixation trimalleolar ankle fracture  . TEE WITHOUT CARDIOVERSION N/A 04/01/2015   Procedure: TRANSESOPHAGEAL ECHOCARDIOGRAM (TEE);  Surgeon: Ivin Poot, MD;  Location: [facility];  Service: Open Heart Surgery;  Laterality: N/A;  . TONSILLECTOMY    . TOTAL ABDOMINAL HYSTERECTOMY W/ BILATERAL SALPINGOOPHORECTOMY  1980   dysfunctional menses, endometriosis  . [place], 2009    Social History   Socioeconomic History  . Marital status:  Married    Spouse name: Not on file  . Number of children: 2  . Years of education: Not on file  . Highest education level: Not on file  Occupational History  . Occupation: Retired    Fish farm manager: RETIRED  Social Needs  . Financial resource strain: Not on file  . Food insecurity:    Worry: Not on file    Inability: Not on file  . Transportation needs:    Medical: Not on file    Non-medical: Not on file  Tobacco Use  . Smoking status: Never Smoker  . Smokeless tobacco: Never Used  Substance and Sexual Activity  . Alcohol use: No  . Drug use: No  . Sexual activity: Not on file  Lifestyle  . Physical activity:    Days per week:  Not on file    Minutes per session: Not on file  . Stress: Not on file  Relationships  . Social connections:    Talks on phone: Not on file    Gets together: Not on file    Attends religious service: Not on file    Active member of club or organization: Not on file    Attends meetings of clubs or organizations: Not on file    Relationship status: Not on file  . Intimate partner violence:    Fear of current or ex partner: Not on file    Emotionally abused: Not on file    Physically abused: Not on file    Forced sexual activity: Not on file  Other Topics Concern  . Not on file  Social History Narrative   REG EXERCISE   HAD COLONOSCOPY AND ENDOSCOPY DONE DATES UNKNOWN    Family History  Problem Relation Age of Onset  . Heart attack Father 55  . Alzheimer's disease Mother 19       TIAs  . Colon cancer Maternal Grandmother   . Heart attack Paternal Grandmother 82  . Hyperlipidemia Brother   . Hypertension Brother   . Diverticulitis Daughter        S/P colectomy  . Diabetes Neg Hx   . Stroke Neg Hx     ROS: no fevers or chills, productive cough, hemoptysis, dysphasia, odynophagia, melena, hematochezia, dysuria, hematuria, rash, seizure activity, orthopnea, PND, claudication. Remaining systems are negative.  Physical Exam: Well-developed  well-nourished in no acute distress.  Skin is warm and dry.  HEENT is normal.  Neck is supple.  Chest is clear to auscultation with normal expansion.  Cardiovascular exam is irregular Abdominal exam nontender or distended. No masses palpated. Extremities show varicosities and trace edema neuro grossly intact  ECG-atrial fibrillation at a rate of 91.  Right bundle branch block.  Personally reviewed  A/P  1 permanent atrial fibrillation-patient remains asymptomatic.  Plan to continue metoprolol for rate control.  Continue apixaban.  2 CAD-continue statin; no ASA given need for anticoagulation.  3 hypertension-patient's blood pressure is controlled today.  Continue present medications and follow.  4 hyperlipidemia-continue Crestor at present dose.  Patient had myalgias with Lipitor previously.  5 Atrial myxoma-status post resection.  No recurrence on most recent echocardiogram.  6 edema-continue present dose of Lasix.  Kirk Ruths, MD

## 2018-08-14 ENCOUNTER — Ambulatory Visit: Payer: Medicare HMO | Admitting: Cardiology

## 2018-08-14 ENCOUNTER — Encounter: Payer: Self-pay | Admitting: Cardiology

## 2018-08-14 VITALS — BP 118/76 | HR 91 | Ht 67.0 in | Wt 171.0 lb

## 2018-08-14 DIAGNOSIS — E7800 Pure hypercholesterolemia, unspecified: Secondary | ICD-10-CM | POA: Diagnosis not present

## 2018-08-14 DIAGNOSIS — M9904 Segmental and somatic dysfunction of sacral region: Secondary | ICD-10-CM | POA: Diagnosis not present

## 2018-08-14 DIAGNOSIS — I10 Essential (primary) hypertension: Secondary | ICD-10-CM | POA: Diagnosis not present

## 2018-08-14 DIAGNOSIS — I2510 Atherosclerotic heart disease of native coronary artery without angina pectoris: Secondary | ICD-10-CM

## 2018-08-14 DIAGNOSIS — M545 Low back pain: Secondary | ICD-10-CM | POA: Diagnosis not present

## 2018-08-14 DIAGNOSIS — M62830 Muscle spasm of back: Secondary | ICD-10-CM | POA: Diagnosis not present

## 2018-08-14 DIAGNOSIS — D151 Benign neoplasm of heart: Secondary | ICD-10-CM | POA: Diagnosis not present

## 2018-08-14 DIAGNOSIS — M9901 Segmental and somatic dysfunction of cervical region: Secondary | ICD-10-CM | POA: Diagnosis not present

## 2018-08-14 DIAGNOSIS — I482 Chronic atrial fibrillation: Secondary | ICD-10-CM | POA: Diagnosis not present

## 2018-08-14 DIAGNOSIS — M542 Cervicalgia: Secondary | ICD-10-CM | POA: Diagnosis not present

## 2018-08-14 DIAGNOSIS — M9903 Segmental and somatic dysfunction of lumbar region: Secondary | ICD-10-CM | POA: Diagnosis not present

## 2018-08-14 DIAGNOSIS — I4821 Permanent atrial fibrillation: Secondary | ICD-10-CM

## 2018-08-14 NOTE — Patient Instructions (Signed)
Your physician wants you to follow-up in: ONE YEAR WITH DR CRENSHAW You will receive a reminder letter in the mail two months in advance. If you don't receive a letter, please call our office to schedule the follow-up appointment.   If you need a refill on your cardiac medications before your next appointment, please call your pharmacy.  

## 2018-09-04 ENCOUNTER — Ambulatory Visit (INDEPENDENT_AMBULATORY_CARE_PROVIDER_SITE_OTHER): Payer: Medicare HMO | Admitting: Emergency Medicine

## 2018-09-04 DIAGNOSIS — Z23 Encounter for immunization: Secondary | ICD-10-CM | POA: Diagnosis not present

## 2018-09-14 ENCOUNTER — Other Ambulatory Visit: Payer: Self-pay | Admitting: Internal Medicine

## 2018-09-18 DIAGNOSIS — M9901 Segmental and somatic dysfunction of cervical region: Secondary | ICD-10-CM | POA: Diagnosis not present

## 2018-09-18 DIAGNOSIS — M9904 Segmental and somatic dysfunction of sacral region: Secondary | ICD-10-CM | POA: Diagnosis not present

## 2018-09-18 DIAGNOSIS — M62830 Muscle spasm of back: Secondary | ICD-10-CM | POA: Diagnosis not present

## 2018-09-18 DIAGNOSIS — M542 Cervicalgia: Secondary | ICD-10-CM | POA: Diagnosis not present

## 2018-09-18 DIAGNOSIS — M545 Low back pain: Secondary | ICD-10-CM | POA: Diagnosis not present

## 2018-09-18 DIAGNOSIS — M9903 Segmental and somatic dysfunction of lumbar region: Secondary | ICD-10-CM | POA: Diagnosis not present

## 2018-09-21 DIAGNOSIS — M545 Low back pain: Secondary | ICD-10-CM | POA: Diagnosis not present

## 2018-09-21 DIAGNOSIS — M542 Cervicalgia: Secondary | ICD-10-CM | POA: Diagnosis not present

## 2018-09-21 DIAGNOSIS — M9904 Segmental and somatic dysfunction of sacral region: Secondary | ICD-10-CM | POA: Diagnosis not present

## 2018-09-21 DIAGNOSIS — M62830 Muscle spasm of back: Secondary | ICD-10-CM | POA: Diagnosis not present

## 2018-09-21 DIAGNOSIS — M9903 Segmental and somatic dysfunction of lumbar region: Secondary | ICD-10-CM | POA: Diagnosis not present

## 2018-09-21 DIAGNOSIS — M9901 Segmental and somatic dysfunction of cervical region: Secondary | ICD-10-CM | POA: Diagnosis not present

## 2018-09-25 DIAGNOSIS — M9903 Segmental and somatic dysfunction of lumbar region: Secondary | ICD-10-CM | POA: Diagnosis not present

## 2018-09-25 DIAGNOSIS — M9901 Segmental and somatic dysfunction of cervical region: Secondary | ICD-10-CM | POA: Diagnosis not present

## 2018-09-25 DIAGNOSIS — M62830 Muscle spasm of back: Secondary | ICD-10-CM | POA: Diagnosis not present

## 2018-09-25 DIAGNOSIS — M545 Low back pain: Secondary | ICD-10-CM | POA: Diagnosis not present

## 2018-09-25 DIAGNOSIS — M9904 Segmental and somatic dysfunction of sacral region: Secondary | ICD-10-CM | POA: Diagnosis not present

## 2018-09-25 DIAGNOSIS — M542 Cervicalgia: Secondary | ICD-10-CM | POA: Diagnosis not present

## 2018-10-02 ENCOUNTER — Ambulatory Visit: Payer: Medicare HMO

## 2018-10-02 ENCOUNTER — Telehealth: Payer: Self-pay | Admitting: Internal Medicine

## 2018-10-02 ENCOUNTER — Other Ambulatory Visit: Payer: Self-pay | Admitting: Cardiology

## 2018-10-02 DIAGNOSIS — M9903 Segmental and somatic dysfunction of lumbar region: Secondary | ICD-10-CM | POA: Diagnosis not present

## 2018-10-02 DIAGNOSIS — M542 Cervicalgia: Secondary | ICD-10-CM | POA: Diagnosis not present

## 2018-10-02 DIAGNOSIS — M9901 Segmental and somatic dysfunction of cervical region: Secondary | ICD-10-CM | POA: Diagnosis not present

## 2018-10-02 DIAGNOSIS — M810 Age-related osteoporosis without current pathological fracture: Secondary | ICD-10-CM | POA: Diagnosis not present

## 2018-10-02 DIAGNOSIS — M62830 Muscle spasm of back: Secondary | ICD-10-CM | POA: Diagnosis not present

## 2018-10-02 DIAGNOSIS — M545 Low back pain: Secondary | ICD-10-CM | POA: Diagnosis not present

## 2018-10-02 DIAGNOSIS — M9904 Segmental and somatic dysfunction of sacral region: Secondary | ICD-10-CM | POA: Diagnosis not present

## 2018-10-02 MED ORDER — DENOSUMAB 60 MG/ML ~~LOC~~ SOSY
60.0000 mg | PREFILLED_SYRINGE | Freq: Once | SUBCUTANEOUS | Status: AC
  Administered 2018-10-02: 60 mg via SUBCUTANEOUS

## 2018-10-02 NOTE — Progress Notes (Signed)
I have reviewed and agree.

## 2018-10-03 MED ORDER — DICYCLOMINE HCL 20 MG PO TABS: ORAL_TABLET | ORAL | 1 refills | Status: DC

## 2018-10-03 NOTE — Telephone Encounter (Signed)
Refilled Dicyclomine

## 2018-10-06 DIAGNOSIS — Z1231 Encounter for screening mammogram for malignant neoplasm of breast: Secondary | ICD-10-CM | POA: Diagnosis not present

## 2018-10-06 LAB — HM MAMMOGRAPHY

## 2018-10-13 ENCOUNTER — Encounter: Payer: Self-pay | Admitting: Internal Medicine

## 2018-10-24 ENCOUNTER — Other Ambulatory Visit: Payer: Self-pay

## 2018-10-24 DIAGNOSIS — E7800 Pure hypercholesterolemia, unspecified: Secondary | ICD-10-CM

## 2018-10-24 MED ORDER — ROSUVASTATIN CALCIUM 10 MG PO TABS: 10.0000 mg | ORAL_TABLET | Freq: Every day | ORAL | 8 refills | Status: DC

## 2018-11-06 ENCOUNTER — Encounter: Payer: Self-pay | Admitting: Internal Medicine

## 2018-11-06 ENCOUNTER — Ambulatory Visit: Payer: Medicare HMO | Admitting: Internal Medicine

## 2018-11-06 VITALS — BP 110/76 | HR 70 | Ht 65.5 in | Wt 174.2 lb

## 2018-11-06 DIAGNOSIS — K219 Gastro-esophageal reflux disease without esophagitis: Secondary | ICD-10-CM

## 2018-11-06 DIAGNOSIS — K589 Irritable bowel syndrome without diarrhea: Secondary | ICD-10-CM | POA: Diagnosis not present

## 2018-11-06 DIAGNOSIS — K5730 Diverticulosis of large intestine without perforation or abscess without bleeding: Secondary | ICD-10-CM | POA: Diagnosis not present

## 2018-11-06 MED ORDER — DICYCLOMINE HCL 20 MG PO TABS: ORAL_TABLET | ORAL | 11 refills | Status: DC

## 2018-11-06 NOTE — Patient Instructions (Signed)
We have sent the following medications to your pharmacy for you to pick up at your convenience:  Bentyl  Please follow up as needed

## 2018-11-06 NOTE — Progress Notes (Signed)
HISTORY OF PRESENT ILLNESS:  Valerie Rollins is a 82 y.o. female with hypertension, chronic atrial fibrillation, DVT, status post open heart surgery for atrial myxoma and chronic anticoagulation therapy.  She is followed in this office for irritable bowel syndrome, fecal incontinence, and chronic GERD.  Last evaluated December 14th 2016.  She has not been seen since.  She presents today for routine follow-up.  She is accompanied by her daughter.  For her GERD she tells me that she continues on omeprazole 20 mg daily.  Occasional breakthrough symptoms not requiring attention.  No dysphasia.  Next, she reports intermittent problems with abdominal cramping, often after meals.  For this she has used Bentyl on demand with success.  She does request a medication refill.  In terms of her bowel habits, she tends to be more on the constipated side these days.  She believes some of her medicines may be responsible.  Her GI review of systems is otherwise negative.  The patient's last colonoscopy was performed in 2012.  This revealed severe diverticulosis with sigmoid stenosis.  Otherwise normal.  Last upper endoscopy 2006 with gastritis.  Otherwise normal review of outside blood work from July 10, 2018 finds unremarkable comprehensive metabolic panel.  Normal CBC with hemoglobin 14.4.  Review of the x-ray file finds no relevant abnormalities.  The patient's daughter reports that she has had issues with exertional shortness of breath.  She did see her cardiologist recently but did not mention this symptom.  No symptoms at rest  REVIEW OF SYSTEMS:  All non-GI ROS negative unless otherwise stated in the HPI except for irregular heartbeat, allergies, arthritis, back pain, cough, ankle swelling, shortness of breath  Past Medical History:  Diagnosis Date  . Ankle fracture, left 11/12/2011  . Atrial fibrillation ([place])    Post op after repair of ankle fracture  . 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 CAD (coronary artery disease) 03/2015   LAD 80%, otherwise minimal CAD. s/p LIMA-LAD  . Diverticulitis 2002   based on CT scan  . Diverticulosis    Dr Henrene Pastor  . DVT (deep venous thrombosis) ([place]) 2000   no trigger  . Endometriosis   . Gastritis 2006   @ Endo  . GERD (gastroesophageal reflux disease)   . Hiatal hernia   . Hyperlipidemia   . Hypertension   . IBS (irritable bowel syndrome)   . Ocular hypertension    glaucoma suspect, Dr. Kathrin Penner  . Skin cancer   . Superficial phlebitis     X 2, 1999, 2000  . Syncope 11/12/11   in context CAP . Carotid Doppler exam was negative. 2D echocardiogram revealed mild dilation of the left atrium and moderate increase in the systolic pressureof  the pulmonary artery  . UTI (lower urinary tract infection) 01/2013   Strep bovis ; PCN sensitive    Past Surgical History:  Procedure Laterality Date  . CARDIAC CATHETERIZATION N/A 03/28/2015   Procedure: Left Heart Cath and Coronary Angiography;  Surgeon: Jolaine Artist, MD;  Location: [facility];  Service: Cardiovascular;  Laterality: N/A;  . CATARACT EXTRACTION Bilateral   . CATARACT EXTRACTION W/ INTRAOCULAR LENS IMPLANT     OS; Dr Kathrin Penner  . COLONOSCOPY  1995, 2002, 2012   diverticulosis; Dr Henrene Pastor  . CORONARY ARTERY BYPASS GRAFT N/A 04/01/2015   Procedure: CORONARY ARTERY BYPASS GRAFTING (CABG), ON PUMP, TIMES ONE, USING LEFT INTERNAL MAMMARY ARTERY;  Surgeon:  Ivin Poot, MD;  Location: [facility];  Service: Open Heart Surgery;  Laterality: N/A;  . EXCISION OF ATRIAL MYXOMA N/A 04/01/2015   Procedure: EXCISION OF ATRIAL MYXOMA;  Surgeon: Ivin Poot, MD;  Location: [facility];  Service: Open Heart Surgery;  Laterality: N/A;  . ORIF ANKLE FRACTURE  11/14/2011   Procedure: OPEN REDUCTION INTERNAL FIXATION (ORIF) ANKLE FRACTURE;  Surgeon: Colin Rhein;  [facility];  Service: Orthopedics;  Laterality: Left;  open reduction internal fixation  trimalleolar ankle fracture  . TEE WITHOUT CARDIOVERSION N/A 04/01/2015   Procedure: TRANSESOPHAGEAL ECHOCARDIOGRAM (TEE);  Surgeon: Ivin Poot, MD;  Location: [facility];  Service: Open Heart Surgery;  Laterality: N/A;  . TONSILLECTOMY    . TOTAL ABDOMINAL HYSTERECTOMY W/ BILATERAL SALPINGOOPHORECTOMY  1980   dysfunctional menses, endometriosis  . [place], 2009    Social History TEDI HUGHSON  reports that she has never smoked. She has never used smokeless tobacco. She reports that she does not drink alcohol or use drugs.  family history includes Alzheimer's disease (age of onset: 2) in her mother; Colon cancer in her maternal grandmother; Diverticulitis in her daughter; Heart attack (age of onset: 81) in her father; Heart attack (age of onset: 71) in her paternal grandmother; Hyperlipidemia in her brother; Hypertension in her brother.  Allergies  Allergen Reactions  . Oxybutynin Chloride     Tongue swelling; Rx from Dr Hessie Diener  . Propoxyphene N-Acetaminophen     REACTION: made pt blood to thin  . Alendronate Sodium     ? reaction  . Ramipril     REACTION: cough       PHYSICAL EXAMINATION: Vital signs: BP 110/76   Pulse 70   Ht 5' 5.5" (1.664 m)   Wt 174 lb 4 oz (79 kg)   BMI 28.56 kg/m   Constitutional: generally well-appearing, no acute distress Psychiatric: alert and oriented x3, cooperative Eyes: extraocular movements intact, anicteric, conjunctiva pink Mouth: oral pharynx moist, no lesions Neck: supple no lymphadenopathy Chest: Median sternotomy scar well-healed Cardiovascular: heart irregular irregular rate and rhythm, no murmur Lungs: clear to auscultation bilaterally Abdomen: soft, nontender, nondistended, no obvious ascites, no peritoneal signs, normal bowel sounds, no organomegaly Rectal: Omitted Extremities: no clubbing or cyanosis.  Trace lower extremity edema bilaterally Skin: no lesions on visible extremities Neuro: No focal  deficits.  Cranial nerves intact    ASSESSMENT:  1.  GERD.  For the most part well controlled on omeprazole 20 mg daily 2.  Irritable bowel syndrome.  Previous diarrhea.  Now more constipation.  May be pain medication. 3.  Abdominal cramping related to irritable bowel or diverticular spasm.  Response to Bentyl 4.  Colonoscopy 2012 without neoplasia but severe diverticulosis 5.  Exertional shortness of breath.  Non-GI  PLAN:  1.  Reflux precautions 2.  Continue omeprazole 3.  Refill Bentyl 20 mg; #60; 1 every 6 hours as needed for abdominal cramping pain.  Multiple refills 4.  Advised to see their PCP or cardiologist regarding exertional shortness of breath.  They agree 5.  GI follow-up as needed

## 2018-11-07 DIAGNOSIS — R69 Illness, unspecified: Secondary | ICD-10-CM | POA: Diagnosis not present

## 2018-11-07 DIAGNOSIS — L821 Other seborrheic keratosis: Secondary | ICD-10-CM | POA: Diagnosis not present

## 2018-11-07 DIAGNOSIS — Z1283 Encounter for screening for malignant neoplasm of skin: Secondary | ICD-10-CM | POA: Diagnosis not present

## 2018-11-08 ENCOUNTER — Telehealth: Payer: Self-pay | Admitting: Cardiology

## 2018-11-08 NOTE — Telephone Encounter (Signed)
New message    Pt c/o Shortness Of Breath: STAT if SOB developed within the last 24 hours or pt is noticeably SOB on the phone  1. Are you currently SOB (can you hear that pt is SOB on the phone)? no  2. How long have you been experiencing SOB? Its off and on, worse when she has to walk a lot  3. Are you SOB when sitting or when up moving around?with exertion   4. Are you currently experiencing any other symptoms? Patient is only having shortness of breath when moving and walking around, she has had some swelling but she said she has had this for awhile

## 2018-11-08 NOTE — Telephone Encounter (Signed)
Returned call to patient.She stated she has been sob for the past 2 months or more.Stated sob getting worse.Stated hard for her to walk much distance without being sob.No chest pain.She has swelling in lower legs,but no worse.Weight stable.Appointment scheduled with Jory Sims DNP 11/09/18 at 9:30 am.

## 2018-11-08 NOTE — Progress Notes (Signed)
Cardiology Office Note   Date:  11/09/2018   ID:  Valerie Rollins, DOB 18-Nov-1936, MRN [id number]  PCP:  Binnie Rail, MD  Cardiologist:  [facility]  Chief Complaint  Patient presents with  . Shortness of Breath  . Coronary Artery Disease     History of Present Illness: Valerie Rollins is a 82 y.o. female who presents for ongoing assessment and management of CAD with CABG in 2016 with LIMA to LAD, resection of an atrial myxoma and LAA oversewn, atrial fib on Eliquis, HTN and HL, with multiple medical problems. She takes lasix for LEE.   She called our office on 11/08/2018 for complaints of shortness of breath for 2 months and noticed worsening of symptoms, progressing to significant DOE with minimal walking.   She denies chest pain, dizziness or diaphoresis associated with the dyspnea. She states that she is using her inhalers more often now to help her with the dyspnea. She is unaware of irregular HR, or racing HR when she is walking.   Past Medical History:  Diagnosis Date  . Ankle fracture, left 11/12/2011  . Atrial fibrillation ([place])    Post op after repair of ankle fracture  . 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 CAD (coronary artery disease) 03/2015   LAD 80%, otherwise minimal CAD. s/p LIMA-LAD  . Diverticulitis 2002   based on CT scan  . Diverticulosis    Dr Henrene Pastor  . DVT (deep venous thrombosis) ([place]) 2000   no trigger  . Endometriosis   . Gastritis 2006   @ Endo  . GERD (gastroesophageal reflux disease)   . Hiatal hernia   . Hyperlipidemia   . Hypertension   . IBS (irritable bowel syndrome)   . Ocular hypertension    glaucoma suspect, Dr. Kathrin Penner  . Skin cancer   . Superficial phlebitis     X 2, 1999, 2000  . Syncope 11/12/11   in context CAP . Carotid Doppler exam was negative. 2D echocardiogram revealed mild dilation of the left atrium and moderate increase in the systolic pressureof  the pulmonary artery  . UTI (lower  urinary tract infection) 01/2013   Strep bovis ; PCN sensitive    Past Surgical History:  Procedure Laterality Date  . CARDIAC CATHETERIZATION N/A 03/28/2015   Procedure: Left Heart Cath and Coronary Angiography;  Surgeon: Jolaine Artist, MD;  Location: [place] INVASIVE CV LAB CUPID;  Service: Cardiovascular;  Laterality: N/A;  . CATARACT EXTRACTION Bilateral   . CATARACT EXTRACTION W/ INTRAOCULAR LENS IMPLANT     OS; Dr Kathrin Penner  . COLONOSCOPY  1995, 2002, 2012   diverticulosis; Dr Henrene Pastor  . CORONARY ARTERY BYPASS GRAFT N/A 04/01/2015   Procedure: CORONARY ARTERY BYPASS GRAFTING (CABG), ON PUMP, TIMES ONE, USING LEFT INTERNAL MAMMARY ARTERY;  Surgeon: Ivin Poot, MD;  Location: [place];  Service: Open Heart Surgery;  Laterality: N/A;  . EXCISION OF ATRIAL MYXOMA N/A 04/01/2015   Procedure: EXCISION OF ATRIAL MYXOMA;  Surgeon: Ivin Poot, MD;  Location: [place];  Service: Open Heart Surgery;  Laterality: N/A;  . ORIF ANKLE FRACTURE  11/14/2011   Procedure: OPEN REDUCTION INTERNAL FIXATION (ORIF) ANKLE FRACTURE;  Surgeon: Colin Rhein;  [facility];  Service: Orthopedics;  Laterality: Left;  open reduction internal fixation trimalleolar ankle fracture  . TEE WITHOUT CARDIOVERSION N/A 04/01/2015   Procedure: TRANSESOPHAGEAL ECHOCARDIOGRAM (TEE);  Surgeon: Ivin Poot, MD;  [facility];  Service: Open Heart Surgery;  Laterality: N/A;  . TONSILLECTOMY    . TOTAL ABDOMINAL HYSTERECTOMY W/ BILATERAL SALPINGOOPHORECTOMY  1980   dysfunctional menses, endometriosis  . [place], 2009     Current Outpatient Medications  Medication Sig Dispense Refill  . calcium citrate-vitamin D (CITRACAL+D) 315-200 MG-UNIT per tablet Take 2 tablets by mouth 2 (two) times daily.    . Cholecalciferol (VITAMIN D3) 1000 UNITS CAPS Take 1,000 Units by mouth daily.     . Coenzyme Q10 (CO Q 10 PO) Take by mouth daily.    . COMBIVENT RESPIMAT 20-100 MCG/ACT AERS respimat  Inhale 1 puff into the lungs as needed. 4 g 5  . dicyclomine (BENTYL) 20 MG tablet TAKE 1 TABLET (20 MG TOTAL) BY MOUTH EVERY 6 (SIX) HOURS AS NEEDED FOR SPASMS. 60 tablet 11  . ELIQUIS 5 MG TABS tablet TAKE 1 TABLET (5 MG TOTAL) BY MOUTH 2 (TWO) TIMES DAILY. 60 tablet 2  . fexofenadine (ALLEGRA) 180 MG tablet Take 180 mg by mouth daily. Pt takes daily    . furosemide (LASIX) 40 MG tablet TAKE 1 TABLET (40 MG TOTAL) BY MOUTH DAILY. 90 tablet 1  . latanoprost (XALATAN) 0.005 % ophthalmic solution Place 1 drop into both eyes daily.      Marland Kitchen losartan (COZAAR) 25 MG tablet TAKE 1 TABLET (25 MG TOTAL) BY MOUTH DAILY. 90 tablet 3  . Magnesium 400 MG CAPS Take 400 mg by mouth every other day.     . metoprolol tartrate (LOPRESSOR) 25 MG tablet TAKE 1 AND 1/2 TABLET BY MOUTH TWICE A DAY 270 tablet 3  . montelukast (SINGULAIR) 10 MG tablet TAKE 1 TABLET AT BEDTIME 90 tablet 3  . omeprazole (PRILOSEC) 20 MG capsule TAKE ONE CAPSULE BY MOUTH EVERY DAY 90 capsule 1  . rosuvastatin (CRESTOR) 10 MG tablet Take 1 tablet (10 mg total) by mouth daily. 30 tablet 8  . traMADol (ULTRAM) 50 MG tablet Take 1 tablet (50 mg total) by mouth every 6 (six) hours as needed. Dr.Ramos (Patient taking differently: Take 50 mg by mouth every 6 (six) hours as needed. Dr.Ramos; pt takes 1 tablet every night, maybe 1 during the day as needed) 30 tablet 0   No current facility-administered medications for this visit.     Allergies:   Oxybutynin chloride; Propoxyphene n-acetaminophen; Alendronate sodium; and Ramipril    Social History:  The patient  reports that she has never smoked. She has never used smokeless tobacco. She reports that she does not drink alcohol or use drugs.   Family History:  The patient's family history includes Alzheimer's disease (age of onset: 13) in her mother; Colon cancer in her maternal grandmother; Diverticulitis in her daughter; Heart attack (age of onset: 28) in her father; Heart attack (age of onset:  65) in her paternal grandmother; Hyperlipidemia in her brother; Hypertension in her brother.    ROS: All other systems are reviewed and negative. Unless otherwise mentioned in H&P    PHYSICAL EXAM: VS:  BP 128/82   Pulse 77   Ht 5' 5.5" (1.664 m)   Wt 174 lb 3.2 oz (79 kg)   SpO2 100%   BMI 28.55 kg/m  , BMI Body mass index is 28.55 kg/m. GEN: Well nourished, well developed, in no acute distress HEENT: normal Neck: no JVD, carotid bruits, or masses Cardiac: IRRR; no murmurs, rubs, or gallops,1+ dependent edema  Respiratory:  Clear to auscultation bilaterally,  normal work of breathing GI: soft, nontender, nondistended, + BS MS: no deformity or atrophy Skin: warm and dry, no rash Neuro:  Strength and sensation are intact Psych: euthymic mood, full affect   EKG: Not completed this office visit.  Recent Labs: 07/10/2018: ALT 22; BUN 28; Creatinine, Ser 1.08; Hemoglobin 14.4; Platelets 206.0; Potassium 3.8; Sodium 140    Lipid Panel    Component Value Date/Time   CHOL 168 06/06/2018 0951   CHOL 205 (H) 02/26/2015 1123   TRIG 60 06/06/2018 0951   TRIG 102 02/26/2015 1123   HDL 93 06/06/2018 0951   HDL 65 02/26/2015 1123   CHOLHDL 1.8 06/06/2018 0951   CHOLHDL 2 01/03/2017 1117   VLDL 18.4 01/03/2017 1117   LDLCALC 63 06/06/2018 0951   LDLCALC 120 (H) 02/26/2015 1123   LDLDIRECT 152.6 07/31/2013 1154      Wt Readings from Last 3 Encounters:  11/09/18 174 lb 3.2 oz (79 kg)  11/06/18 174 lb 4 oz (79 kg)  08/14/18 171 lb (77.6 kg)      Other studies Reviewed: Echocardiogram 08/30/2018 Left ventricle: The cavity size was normal. Wall thickness was   normal. Systolic function was normal. The estimated ejection   fraction was in the range of 55% to 60%. Wall motion was normal;   there were no regional wall motion abnormalities. - Mitral valve: Calcified annulus. There was mild regurgitation. - Left atrium: The atrium was mildly dilated. - Tricuspid valve: There was  moderate regurgitation. - Pulmonary arteries: Systolic pressure was moderately increased.   PA peak pressure: 46 mm Hg (S).  Impressions:  - Normal LV systolic function; mildly dilated ascending aorta (4.3   cm); mild MR; mild LAE; moderate TR with moderately elevated   pulmonary pressure; no recurrent atrial myxoma.  Cardiac Cath 03/28/2015 There is a high grade napkin-ring like lesion in the proximal LAD with moderate diffuse disease in the mid LAD. Good distal target.   The AV groove LCX is small. There is a large ramus branch which is widely patent however there is a subranch of the ramus which is small and subtotally occluded. This is not graftable.   Assessment/Plan:  1) 2V - CAD 2) Plan LIMA to LAD at time of resection of atrial myxoma  ASSESSMENT AND PLAN:  1. CAD: Known history of CABG in 2016.Marland Kitchen She denies chest pain with dyspnea.   2. Atrial fib: Heat rate is controlled. She is unaware of irregular HR or racing HR with exertion causing her to be dyspneic. She continues on Eliquis and metoprolol. No wheezes are noted.  Repeat echo for evaluation of heart function.   3. Chronic LEE: She is advised to wear support hose, avoid salt and keep legs elevated as often as she can.   4. Hyperlipidemia; Continue statin therapy with rosuvastatin.   Current medicines are reviewed at length with the patient today.    Labs/ tests ordered today include: Echocardiogram  Phill Myron. [place] Pugh, ANP, [facility]   11/09/2018 10:28 AM    [facility] [address] Office [phone] Fax [phone]

## 2018-11-09 ENCOUNTER — Ambulatory Visit: Payer: Medicare HMO | Admitting: Adult Health

## 2018-11-09 ENCOUNTER — Encounter: Payer: Self-pay | Admitting: Adult Health

## 2018-11-09 VITALS — BP 128/82 | HR 77 | Ht 65.5 in | Wt 174.2 lb

## 2018-11-09 DIAGNOSIS — R0602 Shortness of breath: Secondary | ICD-10-CM

## 2018-11-09 DIAGNOSIS — I4891 Unspecified atrial fibrillation: Secondary | ICD-10-CM

## 2018-11-09 DIAGNOSIS — I2510 Atherosclerotic heart disease of native coronary artery without angina pectoris: Secondary | ICD-10-CM | POA: Diagnosis not present

## 2018-11-09 DIAGNOSIS — I10 Essential (primary) hypertension: Secondary | ICD-10-CM | POA: Diagnosis not present

## 2018-11-09 NOTE — Patient Instructions (Signed)
Medication Instructions:  Valerie Sims, DNP recommends that you continue on your current medications as directed. Please refer to the Current Medication list given to you today.  If you need a refill on your cardiac medications before your next appointment, please call your pharmacy.   Testing/Procedures: Your physician has requested that you have an echocardiogram. Echocardiography is a painless test that uses sound waves to create images of your heart. It provides your doctor with information about the size and shape of your heart and how well your heart's chambers and valves are working. This procedure takes approximately one hour. There are no restrictions for this procedure.  >> This will be performed at our [facility] location [place], [place] [address] [phone]  Follow-Up: Valerie Sims, DNP recommends that you schedule a follow-up appointment in 1 month with Dr Stanford Breed.

## 2018-11-13 DIAGNOSIS — M545 Low back pain: Secondary | ICD-10-CM | POA: Diagnosis not present

## 2018-11-13 DIAGNOSIS — M5136 Other intervertebral disc degeneration, lumbar region: Secondary | ICD-10-CM | POA: Diagnosis not present

## 2018-11-20 DIAGNOSIS — H04123 Dry eye syndrome of bilateral lacrimal glands: Secondary | ICD-10-CM | POA: Diagnosis not present

## 2018-11-20 DIAGNOSIS — H401131 Primary open-angle glaucoma, bilateral, mild stage: Secondary | ICD-10-CM | POA: Diagnosis not present

## 2018-11-20 DIAGNOSIS — Z961 Presence of intraocular lens: Secondary | ICD-10-CM | POA: Diagnosis not present

## 2018-11-20 DIAGNOSIS — H0100A Unspecified blepharitis right eye, upper and lower eyelids: Secondary | ICD-10-CM | POA: Diagnosis not present

## 2018-11-27 ENCOUNTER — Ambulatory Visit (HOSPITAL_COMMUNITY): Payer: Medicare HMO | Attending: Cardiology

## 2018-11-27 ENCOUNTER — Other Ambulatory Visit: Payer: Self-pay

## 2018-11-27 DIAGNOSIS — R0602 Shortness of breath: Secondary | ICD-10-CM | POA: Insufficient documentation

## 2018-12-14 NOTE — Progress Notes (Signed)
HPI: FU CAD and atrial myxoma resection. Echo 4/16 orderded by primary care for cardiomegaly; this revealed normal LV function, left atrial mass; mild MR. Cardiac cath 5/16 showed 80 LAD. Preop carotid dopplers 5/16 with no significant stenosis. Patient had CABG with LIMA to LAD, resection of atrial myxoma and LAA oversewn. Patient had recurrent atrial fibrillation in September 2016. She has been treated with rate controlling medications and anticoagulation. Holter 4/18 showed atrial fibrillation, rate mildly decreased late PM and early AM but otherwise controlled. Echocardiogram January 2020 showed normal LV function, moderate left ventricular hypertrophy, trace aortic insufficiency, mildly dilated aortic root and severe left atrial enlargement.  Since last seen,she has dyspnea with more prolonged activities but not routine activities.  No orthopnea, PND, palpitations, syncope or chest pain.  Minimal pedal edema.  Current Outpatient Medications  Medication Sig Dispense Refill  . calcium citrate-vitamin D (CITRACAL+D) 315-200 MG-UNIT per tablet Take 2 tablets by mouth 2 (two) times daily.    . Cholecalciferol (VITAMIN D3) 1000 UNITS CAPS Take 1,000 Units by mouth daily.     . Coenzyme Q10 (CO Q 10 PO) Take by mouth daily.    . COMBIVENT RESPIMAT 20-100 MCG/ACT AERS respimat Inhale 1 puff into the lungs as needed. 4 g 5  . dicyclomine (BENTYL) 20 MG tablet TAKE 1 TABLET (20 MG TOTAL) BY MOUTH EVERY 6 (SIX) HOURS AS NEEDED FOR SPASMS. 60 tablet 11  . ELIQUIS 5 MG TABS tablet TAKE 1 TABLET (5 MG TOTAL) BY MOUTH 2 (TWO) TIMES DAILY. 60 tablet 2  . fexofenadine (ALLEGRA) 180 MG tablet Take 180 mg by mouth daily. Pt takes daily    . furosemide (LASIX) 40 MG tablet TAKE 1 TABLET (40 MG TOTAL) BY MOUTH DAILY. 90 tablet 1  . latanoprost (XALATAN) 0.005 % ophthalmic solution Place 1 drop into both eyes daily.      Marland Kitchen losartan (COZAAR) 25 MG tablet TAKE 1 TABLET (25 MG TOTAL) BY MOUTH DAILY. 90 tablet 3   . Magnesium 400 MG CAPS Take 400 mg by mouth every other day.     . metoprolol tartrate (LOPRESSOR) 25 MG tablet TAKE 1 AND 1/2 TABLET BY MOUTH TWICE A DAY (Patient taking differently: 2 tablets in the morning and 1 tablet in the evening) 270 tablet 3  . montelukast (SINGULAIR) 10 MG tablet TAKE 1 TABLET AT BEDTIME 90 tablet 3  . omeprazole (PRILOSEC) 20 MG capsule TAKE ONE CAPSULE BY MOUTH EVERY DAY 90 capsule 1  . rosuvastatin (CRESTOR) 10 MG tablet Take 1 tablet (10 mg total) by mouth daily. 30 tablet 8  . traMADol (ULTRAM) 50 MG tablet Take 1 tablet (50 mg total) by mouth every 6 (six) hours as needed. Dr.Ramos (Patient taking differently: Take 50 mg by mouth every 6 (six) hours as needed. Dr.Ramos; pt takes 1 tablet every night, maybe 1 during the day as needed) 30 tablet 0   No current facility-administered medications for this visit.      Past Medical History:  Diagnosis Date  . Ankle fracture, left 11/12/2011  . Atrial fibrillation ([place])    Post op after repair of ankle fracture  . 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 CAD (coronary artery disease) 03/2015   LAD 80%, otherwise minimal CAD. s/p LIMA-LAD  . Diverticulitis 2002   based on CT scan  . Diverticulosis    Dr Henrene Pastor  . DVT (deep venous thrombosis) ([place]) 2000  no trigger  . Endometriosis   . Gastritis 2006   @ Endo  . GERD (gastroesophageal reflux disease)   . Hiatal hernia   . Hyperlipidemia   . Hypertension   . IBS (irritable bowel syndrome)   . Ocular hypertension    glaucoma suspect, Dr. Kathrin Penner  . Skin cancer   . Superficial phlebitis     X 2, 1999, 2000  . Syncope 11/12/11   in context CAP . Carotid Doppler exam was negative. 2D echocardiogram revealed mild dilation of the left atrium and moderate increase in the systolic pressureof  the pulmonary artery  . UTI (lower urinary tract infection) 01/2013   Strep bovis ; PCN sensitive    Past Surgical History:    Procedure Laterality Date  . CARDIAC CATHETERIZATION N/A 03/28/2015   Procedure: Left Heart Cath and Coronary Angiography;  Surgeon: Jolaine Artist, MD;  Location: [place] INVASIVE CV LAB CUPID;  Service: Cardiovascular;  Laterality: N/A;  . CATARACT EXTRACTION Bilateral   . CATARACT EXTRACTION W/ INTRAOCULAR LENS IMPLANT     OS; Dr Kathrin Penner  . COLONOSCOPY  1995, 2002, 2012   diverticulosis; Dr Henrene Pastor  . CORONARY ARTERY BYPASS GRAFT N/A 04/01/2015   Procedure: CORONARY ARTERY BYPASS GRAFTING (CABG), ON PUMP, TIMES ONE, USING LEFT INTERNAL MAMMARY ARTERY;  Surgeon: Ivin Poot, MD;  Location: [place];  Service: Open Heart Surgery;  Laterality: N/A;  . EXCISION OF ATRIAL MYXOMA N/A 04/01/2015   Procedure: EXCISION OF ATRIAL MYXOMA;  Surgeon: Ivin Poot, MD;  Location: [place];  Service: Open Heart Surgery;  Laterality: N/A;  . ORIF ANKLE FRACTURE  11/14/2011   Procedure: OPEN REDUCTION INTERNAL FIXATION (ORIF) ANKLE FRACTURE;  Surgeon: Colin Rhein;  [facility];  Service: Orthopedics;  Laterality: Left;  open reduction internal fixation trimalleolar ankle fracture  . TEE WITHOUT CARDIOVERSION N/A 04/01/2015   Procedure: TRANSESOPHAGEAL ECHOCARDIOGRAM (TEE);  Surgeon: Ivin Poot, MD;  Location: [place];  Service: Open Heart Surgery;  Laterality: N/A;  . TONSILLECTOMY    . TOTAL ABDOMINAL HYSTERECTOMY W/ BILATERAL SALPINGOOPHORECTOMY  1980   dysfunctional menses, endometriosis  . [place], 2009    Social History   Socioeconomic History  . Marital status: Married    Spouse name: Not on file  . Number of children: 2  . Years of education: Not on file  . Highest education level: Not on file  Occupational History  . Occupation: Retired    Fish farm manager: RETIRED  Social Needs  . Financial resource strain: Not on file  . Food insecurity:    Worry: Not on file    Inability: Not on file  . Transportation needs:    Medical: Not on file    Non-medical:  Not on file  Tobacco Use  . Smoking status: Never Smoker  . Smokeless tobacco: Never Used  Substance and Sexual Activity  . Alcohol use: No  . Drug use: No  . Sexual activity: Not on file  Lifestyle  . Physical activity:    Days per week: Not on file    Minutes per session: Not on file  . Stress: Not on file  Relationships  . Social connections:    Talks on phone: Not on file    Gets together: Not on file    Attends religious service: Not on file    Active member of club or organization: Not on file    Attends meetings of clubs or  organizations: Not on file    Relationship status: Not on file  . Intimate partner violence:    Fear of current or ex partner: Not on file    Emotionally abused: Not on file    Physically abused: Not on file    Forced sexual activity: Not on file  Other Topics Concern  . Not on file  Social History Narrative   REG EXERCISE   HAD COLONOSCOPY AND ENDOSCOPY DONE DATES UNKNOWN    Family History  Problem Relation Age of Onset  . Heart attack Father 58  . Alzheimer's disease Mother 33       TIAs  . Colon cancer Maternal Grandmother   . Heart attack Paternal Grandmother 82  . Hyperlipidemia Brother   . Hypertension Brother   . Diverticulitis Daughter        S/P colectomy  . Diabetes Neg Hx   . Stroke Neg Hx     ROS: no fevers or chills, productive cough, hemoptysis, dysphasia, odynophagia, melena, hematochezia, dysuria, hematuria, rash, seizure activity, orthopnea, PND, claudication. Remaining systems are negative.  Physical Exam: Well-developed well-nourished in no acute distress.  Skin is warm and dry.  HEENT is normal.  Neck is supple.  Chest is clear to auscultation with normal expansion.  Cardiovascular exam is irreguar Abdominal exam nontender or distended. No masses palpated. Extremities show trace edema; varicosities neuro grossly intact   A/P  1 permanent atrial fibrillation-plan to continue beta-blocker for rate control.   Continue apixaban.  Check hemoglobin and renal function.  2 coronary artery disease status post coronary artery bypass graft-continue statin.  She is not on aspirin given need for anticoagulation.  3 hypertension-blood pressure is controlled.  Continue present medications and follow.  Check potassium and renal function.  4 hyperlipidemia-continue statin.  Check lipids and liver.  5 history of atrial myxoma-status post resection.  She has had no recurrence on her most recent echocardiogram.  6 peripheral edema-patient appears to be euvolemic.  She has noticed some increased dyspnea on exertion.  Continue present dose of Lasix.  Check BNP.  If elevated will advance Lasix.  We discussed low-sodium diet and fluid restriction.  She is using compression hose which is helping with her peripheral edema.  I have also asked her to keep her feet elevated.  Kirk Ruths, MD

## 2018-12-20 ENCOUNTER — Other Ambulatory Visit: Payer: Self-pay | Admitting: Cardiology

## 2018-12-26 ENCOUNTER — Ambulatory Visit: Payer: Medicare HMO | Admitting: Cardiology

## 2018-12-26 ENCOUNTER — Encounter: Payer: Self-pay | Admitting: Cardiology

## 2018-12-26 VITALS — BP 130/70 | HR 80 | Ht 65.5 in | Wt 175.6 lb

## 2018-12-26 DIAGNOSIS — E7800 Pure hypercholesterolemia, unspecified: Secondary | ICD-10-CM | POA: Diagnosis not present

## 2018-12-26 DIAGNOSIS — R0602 Shortness of breath: Secondary | ICD-10-CM | POA: Diagnosis not present

## 2018-12-26 DIAGNOSIS — I4891 Unspecified atrial fibrillation: Secondary | ICD-10-CM

## 2018-12-26 DIAGNOSIS — I2510 Atherosclerotic heart disease of native coronary artery without angina pectoris: Secondary | ICD-10-CM

## 2018-12-26 NOTE — Patient Instructions (Signed)
Medication Instructions:  NO CHANGE If you need a refill on your cardiac medications before your next appointment, please call your pharmacy.   Lab work: Your physician recommends that you HAVE LAB WORK TODAY If you have labs (blood work) drawn today and your tests are completely normal, you will receive your results only by: Marland Kitchen MyChart Message (if you have MyChart) OR . A paper copy in the mail If you have any lab test that is abnormal or we need to change your treatment, we will call you to review the results.  Follow-Up: At [facility], you and your health needs are our priority.  As part of our continuing mission to provide you with exceptional heart care, we have created designated Provider Care Teams.  These Care Teams include your primary Cardiologist (physician) and Advanced Practice Providers (APPs -  Physician Assistants and Nurse Practitioners) who all work together to provide you with the care you need, when you need it. You will need a follow up appointment in 6 months.  Please call our office 2 months in advance to schedule this appointment.  You may see Kirk Ruths MD or one of the following Advanced Practice Providers on your designated Care Team:   Kerin Ransom, PA-C Roby Lofts, [place] . Sande Rives, PA-C  CALL IN June TO SCHEDULE APPOINTMENT IN [place]

## 2018-12-27 ENCOUNTER — Encounter: Payer: Self-pay | Admitting: *Deleted

## 2018-12-27 LAB — PRO B NATRIURETIC PEPTIDE: NT-Pro BNP: 696 pg/mL (ref 0–738)

## 2018-12-27 LAB — COMPREHENSIVE METABOLIC PANEL
A/G RATIO: 2.3 — AB (ref 1.2–2.2)
ALBUMIN: 4.5 g/dL (ref 3.7–4.7)
ALT: 29 IU/L (ref 0–32)
AST: 35 IU/L (ref 0–40)
Alkaline Phosphatase: 55 IU/L (ref 39–117)
BILIRUBIN TOTAL: 0.7 mg/dL (ref 0.0–1.2)
BUN / CREAT RATIO: 26 (ref 12–28)
BUN: 24 mg/dL (ref 8–27)
CO2: 23 mmol/L (ref 20–29)
Calcium: 10.4 mg/dL — ABNORMAL HIGH (ref 8.7–10.3)
Chloride: 99 mmol/L (ref 96–106)
Creatinine, Ser: 0.91 mg/dL (ref 0.57–1.00)
GFR calc non Af Amer: 60 mL/min/{1.73_m2} (ref >59)
GFR, EST AFRICAN AMERICAN: 69 mL/min/{1.73_m2} (ref >59)
GLOBULIN, TOTAL: 2 g/dL (ref 1.5–4.5)
GLUCOSE: 87 mg/dL (ref 65–99)
Potassium: 4.1 mmol/L (ref 3.5–5.2)
Sodium: 140 mmol/L (ref 134–144)
TOTAL PROTEIN: 6.5 g/dL (ref 6.0–8.5)

## 2018-12-27 LAB — CBC
HEMATOCRIT: 43.1 % (ref 34.0–46.6)
HEMOGLOBIN: 14.4 g/dL (ref 11.1–15.9)
MCH: 30.4 pg (ref 26.6–33.0)
MCHC: 33.4 g/dL (ref 31.5–35.7)
MCV: 91 fL (ref 79–97)
Platelets: 219 10*3/uL (ref 150–450)
RBC: 4.74 x10E6/uL (ref 3.77–5.28)
RDW: 12.5 % (ref 11.7–15.4)
WBC: 5.7 10*3/uL (ref 3.4–10.8)

## 2018-12-27 LAB — LIPID PANEL
CHOLESTEROL TOTAL: 150 mg/dL (ref 100–199)
Chol/HDL Ratio: 1.6 ratio (ref 0.0–4.4)
HDL: 93 mg/dL (ref >39)
LDL Calculated: 47 mg/dL (ref 0–99)
TRIGLYCERIDES: 51 mg/dL (ref 0–149)
VLDL Cholesterol Cal: 10 mg/dL (ref 5–40)

## 2018-12-28 ENCOUNTER — Other Ambulatory Visit: Payer: Self-pay | Admitting: Internal Medicine

## 2019-01-06 ENCOUNTER — Other Ambulatory Visit: Payer: Self-pay | Admitting: Internal Medicine

## 2019-01-13 NOTE — Patient Instructions (Addendum)
All other Health Maintenance issues reviewed.   All recommended immunizations and age-appropriate screenings are up-to-date or discussed.  No immunizations administered today.   Medications reviewed and updated.  Changes include :  none    Please followup in 6 months   Health Maintenance, Female Adopting a healthy lifestyle and getting preventive care can go a long way to promote health and wellness. Talk with your health care provider about what schedule of regular examinations is right for you. This is a good chance for you to check in with your provider about disease prevention and staying healthy. In between checkups, there are plenty of things you can do on your own. Experts have done a lot of research about which lifestyle changes and preventive measures are most likely to keep you healthy. Ask your health care provider for more information. Weight and diet Eat a healthy diet  Be sure to include plenty of vegetables, fruits, low-fat dairy products, and lean protein.  Do not eat a lot of foods high in solid fats, added sugars, or salt.  Get regular exercise. This is one of the most important things you can do for your health. ? Most adults should exercise for at least 150 minutes each week. The exercise should increase your heart rate and make you sweat (moderate-intensity exercise). ? Most adults should also do strengthening exercises at least twice a week. This is in addition to the moderate-intensity exercise. Maintain a healthy weight  Body mass index (BMI) is a measurement that can be used to identify possible weight problems. It estimates body fat based on height and weight. Your health care provider can help determine your BMI and help you achieve or maintain a healthy weight.  For females 77 years of age and older: ? A BMI below 18.5 is considered underweight. ? A BMI of 18.5 to 24.9 is normal. ? A BMI of 25 to 29.9 is considered overweight. ? A BMI of 30 and above is  considered obese. Watch levels of cholesterol and blood lipids  You should start having your blood tested for lipids and cholesterol at 81 years of age, then have this test every 5 years.  You may need to have your cholesterol levels checked more often if: ? Your lipid or cholesterol levels are high. ? You are older than 81 years of age. ? You are at high risk for heart disease. Cancer screening Lung Cancer  Lung cancer screening is recommended for adults 41-61 years old who are at high risk for lung cancer because of a history of smoking.  A yearly low-dose CT scan of the lungs is recommended for people who: ? Currently smoke. ? Have quit within the past 15 years. ? Have at least a 30-pack-year history of smoking. A pack year is smoking an average of one pack of cigarettes a day for 1 year.  Yearly screening should continue until it has been 15 years since you quit.  Yearly screening should stop if you develop a health problem that would prevent you from having lung cancer treatment. Breast Cancer  Practice breast self-awareness. This means understanding how your breasts normally appear and feel.  It also means doing regular breast self-exams. Let your health care provider know about any changes, no matter how small.  If you are in your 20s or 30s, you should have a clinical breast exam (CBE) by a health care provider every 1-3 years as part of a regular health exam.  If you are 40 or  older, have a CBE every year. Also consider having a breast X-ray (mammogram) every year.  If you have a family history of breast cancer, talk to your health care provider about genetic screening.  If you are at high risk for breast cancer, talk to your health care provider about having an MRI and a mammogram every year.  Breast cancer gene (BRCA) assessment is recommended for women who have family members with BRCA-related cancers. BRCA-related cancers  include: ? Breast. ? Ovarian. ? Tubal. ? Peritoneal cancers.  Results of the assessment will determine the need for genetic counseling and BRCA1 and BRCA2 testing. Cervical Cancer Your health care provider may recommend that you be screened regularly for cancer of the pelvic organs (ovaries, uterus, and vagina). This screening involves a pelvic examination, including checking for microscopic changes to the surface of your cervix (Pap test). You may be encouraged to have this screening done every 3 years, beginning at age 21.  For women ages 30-65, health care providers may recommend pelvic exams and Pap testing every 3 years, or they may recommend the Pap and pelvic exam, combined with testing for human papilloma virus (HPV), every 5 years. Some types of HPV increase your risk of cervical cancer. Testing for HPV may also be done on women of any age with unclear Pap test results.  Other health care providers may not recommend any screening for nonpregnant women who are considered low risk for pelvic cancer and who do not have symptoms. Ask your health care provider if a screening pelvic exam is right for you.  If you have had past treatment for cervical cancer or a condition that could lead to cancer, you need Pap tests and screening for cancer for at least 20 years after your treatment. If Pap tests have been discontinued, your risk factors (such as having a new sexual partner) need to be reassessed to determine if screening should resume. Some women have medical problems that increase the chance of getting cervical cancer. In these cases, your health care provider may recommend more frequent screening and Pap tests. Colorectal Cancer  This type of cancer can be detected and often prevented.  Routine colorectal cancer screening usually begins at 81 years of age and continues through 81 years of age.  Your health care provider may recommend screening at an earlier age if you have risk factors for  colon cancer.  Your health care provider may also recommend using home test kits to check for hidden blood in the stool.  A small camera at the end of a tube can be used to examine your colon directly (sigmoidoscopy or colonoscopy). This is done to check for the earliest forms of colorectal cancer.  Routine screening usually begins at age 50.  Direct examination of the colon should be repeated every 5-10 years through 81 years of age. However, you may need to be screened more often if early forms of precancerous polyps or small growths are found. Skin Cancer  Check your skin from head to toe regularly.  Tell your health care provider about any new moles or changes in moles, especially if there is a change in a mole's shape or color.  Also tell your health care provider if you have a mole that is larger than the size of a pencil eraser.  Always use sunscreen. Apply sunscreen liberally and repeatedly throughout the day.  Protect yourself by wearing long sleeves, pants, a wide-brimmed hat, and sunglasses whenever you are outside. Heart disease, diabetes,   and high blood pressure  High blood pressure causes heart disease and increases the risk of stroke. High blood pressure is more likely to develop in: ? People who have blood pressure in the high end of the normal range (130-139/85-89 mm Hg). ? People who are overweight or obese. ? People who are African American.  If you are 18-39 years of age, have your blood pressure checked every 3-5 years. If you are 40 years of age or older, have your blood pressure checked every year. You should have your blood pressure measured twice-once when you are at a hospital or clinic, and once when you are not at a hospital or clinic. Record the average of the two measurements. To check your blood pressure when you are not at a hospital or clinic, you can use: ? An automated blood pressure machine at a pharmacy. ? A home blood pressure monitor.  If you are  between 55 years and 79 years old, ask your health care provider if you should take aspirin to prevent strokes.  Have regular diabetes screenings. This involves taking a blood sample to check your fasting blood sugar level. ? If you are at a normal weight and have a low risk for diabetes, have this test once every three years after 81 years of age. ? If you are overweight and have a high risk for diabetes, consider being tested at a younger age or more often. Preventing infection Hepatitis B  If you have a higher risk for hepatitis B, you should be screened for this virus. You are considered at high risk for hepatitis B if: ? You were born in a country where hepatitis B is common. Ask your health care provider which countries are considered high risk. ? Your parents were born in a high-risk country, and you have not been immunized against hepatitis B (hepatitis B vaccine). ? You have HIV or AIDS. ? You use needles to inject street drugs. ? You live with someone who has hepatitis B. ? You have had sex with someone who has hepatitis B. ? You get hemodialysis treatment. ? You take certain medicines for conditions, including cancer, organ transplantation, and autoimmune conditions. Hepatitis C  Blood testing is recommended for: ? Everyone born from 1945 through 1965. ? Anyone with known risk factors for hepatitis C. Sexually transmitted infections (STIs)  You should be screened for sexually transmitted infections (STIs) including gonorrhea and chlamydia if: ? You are sexually active and are younger than 81 years of age. ? You are older than 81 years of age and your health care provider tells you that you are at risk for this type of infection. ? Your sexual activity has changed since you were last screened and you are at an increased risk for chlamydia or gonorrhea. Ask your health care provider if you are at risk.  If you do not have HIV, but are at risk, it may be recommended that you take  a prescription medicine daily to prevent HIV infection. This is called pre-exposure prophylaxis (PrEP). You are considered at risk if: ? You are sexually active and do not regularly use condoms or know the HIV status of your partner(s). ? You take drugs by injection. ? You are sexually active with a partner who has HIV. Talk with your health care provider about whether you are at high risk of being infected with HIV. If you choose to begin PrEP, you should first be tested for HIV. You should then be tested every   3 months for as long as you are taking PrEP. Pregnancy  If you are premenopausal and you may become pregnant, ask your health care provider about preconception counseling.  If you may become pregnant, take 400 to 800 micrograms (mcg) of folic acid every day.  If you want to prevent pregnancy, talk to your health care provider about birth control (contraception). Osteoporosis and menopause  Osteoporosis is a disease in which the bones lose minerals and strength with aging. This can result in serious bone fractures. Your risk for osteoporosis can be identified using a bone density scan.  If you are 65 years of age or older, or if you are at risk for osteoporosis and fractures, ask your health care provider if you should be screened.  Ask your health care provider whether you should take a calcium or vitamin D supplement to lower your risk for osteoporosis.  Menopause may have certain physical symptoms and risks.  Hormone replacement therapy may reduce some of these symptoms and risks. Talk to your health care provider about whether hormone replacement therapy is right for you. Follow these instructions at home:  Schedule regular health, dental, and eye exams.  Stay current with your immunizations.  Do not use any tobacco products including cigarettes, chewing tobacco, or electronic cigarettes.  If you are pregnant, do not drink alcohol.  If you are breastfeeding, limit how  much and how often you drink alcohol.  Limit alcohol intake to no more than 1 drink per day for nonpregnant women. One drink equals 12 ounces of beer, 5 ounces of wine, or 1 ounces of hard liquor.  Do not use street drugs.  Do not share needles.  Ask your health care provider for help if you need support or information about quitting drugs.  Tell your health care provider if you often feel depressed.  Tell your health care provider if you have ever been abused or do not feel safe at home. This information is not intended to replace advice given to you by your health care provider. Make sure you discuss any questions you have with your health care provider. Document Released: 05/24/2011 Document Revised: 04/15/2016 Document Reviewed: 08/12/2015 Elsevier Interactive Patient Education  2019 Elsevier Inc.  

## 2019-01-13 NOTE — Progress Notes (Signed)
Subjective:    Patient ID: Valerie Rollins, female    DOB: 06/09/1936, 83 y.o.   MRN: [id number]  HPI She is here for a physical exam.   She has a little tickle in her throat at night and it makes her cough.  She feels her GERD is controlled.  She does have a runny nose often during the day and may have PND at night.  She has tried flonase and it helped.   She has no other concerns.    Medications and allergies reviewed with patient and updated if appropriate.  Patient Active Problem List   Diagnosis Date Noted  . Skin yeast infection 04/20/2018  . Thrush 04/20/2018  . Angular stomatitis 04/20/2018  . Seasonal allergic rhinitis due to pollen 03/21/2018  . Bilateral leg edema 12/31/2016  . Prediabetes 01/10/2016  . Osteopenia 12/26/2015  . Mass of neck 07/07/2015  . Encounter for therapeutic drug monitoring 05/14/2015  . Long-term (current) use of anticoagulants 04/16/2015  . S/P CABG x 1 04/01/2015  . CAD (coronary artery disease) 03/28/2015  . 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 Cardiomegaly 02/20/2015  . DDD (degenerative disc disease), lumbar 07/20/2012  . Atrial fibrillation ([place]) 11/15/2011  . Syncope 11/12/2011  . Hypokalemia 11/12/2011  . RBBB (right bundle branch block) 11/12/2011  . HTN (hypertension), malignant 11/12/2011  . HOARSENESS 02/23/2010  . SKIN CANCER, HX OF 11/18/2009  . Diverticulosis of large intestine 10/31/2008  . HEART MURMUR, SYSTOLIC 78/93/8101  . VARICOSE VEINS, LOWER EXTREMITIES 03/13/2008  . GERD 02/20/2008  . IBS 02/20/2008  . OSTEOARTHRITIS 02/20/2008  . Hyperlipidemia 12/01/2006  . Essential hypertension 12/01/2006  . SUPERFICIAL THROMBOPHLEBITIS 12/01/2006  . DVT, HX OF 12/01/2006    Current Outpatient Medications on File Prior to Visit  Medication Sig Dispense Refill  . calcium citrate-vitamin D (CITRACAL+D) 315-200 MG-UNIT per tablet Take 2 tablets by mouth 2 (two) times daily.    . Cholecalciferol (VITAMIN D3) 1000 UNITS CAPS Take  1,000 Units by mouth daily.     . Coenzyme Q10 (CO Q 10 PO) Take by mouth daily.    . COMBIVENT RESPIMAT 20-100 MCG/ACT AERS respimat Inhale 1 puff into the lungs as needed. 4 g 5  . dicyclomine (BENTYL) 20 MG tablet TAKE 1 TABLET (20 MG TOTAL) BY MOUTH EVERY 6 (SIX) HOURS AS NEEDED FOR SPASMS. 60 tablet 11  . ELIQUIS 5 MG TABS tablet TAKE 1 TABLET (5 MG TOTAL) BY MOUTH 2 (TWO) TIMES DAILY. 60 tablet 2  . fexofenadine (ALLEGRA) 180 MG tablet Take 180 mg by mouth daily. Pt takes daily    . furosemide (LASIX) 40 MG tablet TAKE 1 TABLET (40 MG TOTAL) BY MOUTH DAILY. 90 tablet 1  . latanoprost (XALATAN) 0.005 % ophthalmic solution Place 1 drop into both eyes daily.      Marland Kitchen losartan (COZAAR) 25 MG tablet TAKE 1 TABLET (25 MG TOTAL) BY MOUTH DAILY. 90 tablet 3  . metoprolol tartrate (LOPRESSOR) 25 MG tablet TAKE 1 AND 1/2 TABLET BY MOUTH TWICE A DAY (Patient taking differently: 2 tablets in the morning and 1 tablet in the evening) 270 tablet 3  . montelukast (SINGULAIR) 10 MG tablet TAKE 1 TABLET AT BEDTIME 90 tablet 3  . omeprazole (PRILOSEC) 20 MG capsule TAKE ONE CAPSULE BY MOUTH EVERY DAY 90 capsule 0  . rosuvastatin (CRESTOR) 10 MG tablet Take 1 tablet (10 mg total) by mouth daily. 30 tablet 8  . traMADol (ULTRAM) 50 MG tablet Take 1 tablet (  50 mg total) by mouth every 6 (six) hours as needed. Dr.Ramos (Patient taking differently: Take 50 mg by mouth every 6 (six) hours as needed. Dr.Ramos; pt takes 1 tablet every night, maybe 1 during the day as needed) 30 tablet 0   No current facility-administered medications on file prior to visit.     Past Medical History:  Diagnosis Date  . Ankle fracture, left 11/12/2011  . Atrial fibrillation ([place])    Post op after repair of ankle fracture  . 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 CAD (coronary artery disease) 03/2015   LAD 80%, otherwise minimal CAD. s/p LIMA-LAD  . Diverticulitis 2002   based on CT scan  .  Diverticulosis    Dr Henrene Pastor  . DVT (deep venous thrombosis) ([place]) 2000   no trigger  . Endometriosis   . Gastritis 2006   @ Endo  . GERD (gastroesophageal reflux disease)   . Hiatal hernia   . Hyperlipidemia   . Hypertension   . IBS (irritable bowel syndrome)   . Ocular hypertension    glaucoma suspect, Dr. Kathrin Penner  . Skin cancer   . Superficial phlebitis     X 2, 1999, 2000  . Syncope 11/12/11   in context CAP . Carotid Doppler exam was negative. 2D echocardiogram revealed mild dilation of the left atrium and moderate increase in the systolic pressureof  the pulmonary artery  . UTI (lower urinary tract infection) 01/2013   Strep bovis ; PCN sensitive    Past Surgical History:  Procedure Laterality Date  . CARDIAC CATHETERIZATION N/A 03/28/2015   Procedure: Left Heart Cath and Coronary Angiography;  Surgeon: Jolaine Artist, MD;  Location: [facility];  Service: Cardiovascular;  Laterality: N/A;  . CATARACT EXTRACTION Bilateral   . CATARACT EXTRACTION W/ INTRAOCULAR LENS IMPLANT     OS; Dr Kathrin Penner  . COLONOSCOPY  1995, 2002, 2012   diverticulosis; Dr Henrene Pastor  . CORONARY ARTERY BYPASS GRAFT N/A 04/01/2015   Procedure: CORONARY ARTERY BYPASS GRAFTING (CABG), ON PUMP, TIMES ONE, USING LEFT INTERNAL MAMMARY ARTERY;  Surgeon: Ivin Poot, MD;  Location: [facility];  Service: Open Heart Surgery;  Laterality: N/A;  . EXCISION OF ATRIAL MYXOMA N/A 04/01/2015   Procedure: EXCISION OF ATRIAL MYXOMA;  Surgeon: Ivin Poot, MD;  Location: [facility];  Service: Open Heart Surgery;  Laterality: N/A;  . ORIF ANKLE FRACTURE  11/14/2011   Procedure: OPEN REDUCTION INTERNAL FIXATION (ORIF) ANKLE FRACTURE;  Surgeon: Valerie Rhein;  Location: [facility];  Service: Orthopedics;  Laterality: Left;  open reduction internal fixation trimalleolar ankle fracture  . TEE WITHOUT CARDIOVERSION N/A 04/01/2015   Procedure: TRANSESOPHAGEAL ECHOCARDIOGRAM (TEE);  Surgeon: Ivin Poot, MD;   Location: [facility];  Service: Open Heart Surgery;  Laterality: N/A;  . TONSILLECTOMY    . TOTAL ABDOMINAL HYSTERECTOMY W/ BILATERAL SALPINGOOPHORECTOMY  1980   dysfunctional menses, endometriosis  . [place], 2009    Social History   Socioeconomic History  . Marital status: Married    Spouse name: Not on file  . Number of children: 2  . Years of education: Not on file  . Highest education level: Not on file  Occupational History  . Occupation: Retired    Fish farm manager: RETIRED  Social Needs  . Financial resource strain: Not on file  . Food insecurity:    Worry: Not on file  Inability: Not on file  . Transportation needs:    Medical: Not on file    Non-medical: Not on file  Tobacco Use  . Smoking status: Never Smoker  . Smokeless tobacco: Never Used  Substance and Sexual Activity  . Alcohol use: No  . Drug use: No  . Sexual activity: Not on file  Lifestyle  . Physical activity:    Days per week: Not on file    Minutes per session: Not on file  . Stress: Not on file  Relationships  . Social connections:    Talks on phone: Not on file    Gets together: Not on file    Attends religious service: Not on file    Active member of club or organization: Not on file    Attends meetings of clubs or organizations: Not on file    Relationship status: Not on file  Other Topics Concern  . Not on file  Social History Narrative   REG EXERCISE   HAD COLONOSCOPY AND ENDOSCOPY DONE DATES UNKNOWN    Family History  Problem Relation Age of Onset  . Heart attack Father 42  . Alzheimer's disease Mother 58       TIAs  . Colon cancer Maternal Grandmother   . Heart attack Paternal Grandmother 82  . Hyperlipidemia Brother   . Hypertension Brother   . Diverticulitis Daughter        S/P colectomy  . Diabetes Neg Hx   . Stroke Neg Hx     Review of Systems  Constitutional: Negative for chills and fever.  Eyes: Negative for visual disturbance.  Respiratory:  Positive for cough (dry cough - occ, at night) and shortness of breath (with exertion). Negative for wheezing.   Cardiovascular: Positive for leg swelling. Negative for chest pain and palpitations.  Gastrointestinal: Negative for abdominal pain, blood in stool, constipation, diarrhea and nausea.  Genitourinary: Negative for dysuria and hematuria.  Musculoskeletal: Positive for arthralgias (left MCP).  Skin: Negative for color change and rash.  Neurological: Negative for light-headedness and headaches.  Psychiatric/Behavioral: Negative for dysphoric mood. The patient is not nervous/anxious.        Objective:   Vitals:   01/15/19 1325  BP: 134/82  Pulse: 81  Resp: 16  Temp: 97.8 F (36.6 C)  SpO2: 97%   Filed Weights   01/15/19 1325  Weight: 174 lb (78.9 kg)   Body mass index is 28.51 kg/m.  BP Readings from Last 3 Encounters:  01/15/19 134/82  12/26/18 130/70  11/09/18 128/82    Wt Readings from Last 3 Encounters:  01/15/19 174 lb (78.9 kg)  12/26/18 175 lb 9.6 oz (79.7 kg)  11/09/18 174 lb 3.2 oz (79 kg)     Physical Exam Constitutional: She appears well-developed and well-nourished. No distress.  HENT:  Head: Normocephalic and atraumatic.  Right Ear: External ear normal. Normal ear canal and TM Left Ear: External ear normal.  Normal ear canal and TM Mouth/Throat: Oropharynx is clear and moist.  Eyes: Conjunctivae and EOM are normal.  Neck: Neck supple. No tracheal deviation present. No thyromegaly present. No carotid bruit  Cardiovascular: Normal rate, regular rhythm and normal heart sounds.   No murmur heard.  No edema. Pulmonary/Chest: Effort normal and breath sounds normal. No respiratory distress. She has no wheezes. She has no rales.  Breast: deferred to Gyn Abdominal: Soft. She exhibits no distension. There is no tenderness.  Lymphadenopathy: She has no cervical adenopathy.  Skin: Skin is warm and  dry. She is not diaphoretic.  Psychiatric: She has a  normal mood and affect. Her behavior is normal.        Assessment & Plan:   Physical exam: Screening blood work    ordered Immunizations   Up to date  Colonoscopy   No longer needed due to age 87   Done 09/2018 Dexa   Up to date  - due 2021 Eye exams  Up to date  EKG   07/2018 Exercise   - not regular Weight  Overweight - acceptable for age Skin   Sees derm about yearly - no concerns Substance abuse   none  See Problem List for Assessment and Plan of chronic medical problems.

## 2019-01-15 ENCOUNTER — Encounter: Payer: Self-pay | Admitting: Internal Medicine

## 2019-01-15 ENCOUNTER — Ambulatory Visit (INDEPENDENT_AMBULATORY_CARE_PROVIDER_SITE_OTHER): Payer: Medicare HMO | Admitting: Internal Medicine

## 2019-01-15 VITALS — BP 134/82 | HR 81 | Temp 97.8°F | Resp 16 | Ht 65.5 in | Wt 174.0 lb

## 2019-01-15 DIAGNOSIS — I4891 Unspecified atrial fibrillation: Secondary | ICD-10-CM

## 2019-01-15 DIAGNOSIS — E7800 Pure hypercholesterolemia, unspecified: Secondary | ICD-10-CM | POA: Diagnosis not present

## 2019-01-15 DIAGNOSIS — M8949 Other hypertrophic osteoarthropathy, multiple sites: Secondary | ICD-10-CM

## 2019-01-15 DIAGNOSIS — Z0000 Encounter for general adult medical examination without abnormal findings: Secondary | ICD-10-CM

## 2019-01-15 DIAGNOSIS — M8580 Other specified disorders of bone density and structure, unspecified site: Secondary | ICD-10-CM | POA: Diagnosis not present

## 2019-01-15 DIAGNOSIS — M159 Polyosteoarthritis, unspecified: Secondary | ICD-10-CM

## 2019-01-15 DIAGNOSIS — I10 Essential (primary) hypertension: Secondary | ICD-10-CM | POA: Diagnosis not present

## 2019-01-15 DIAGNOSIS — M150 Primary generalized (osteo)arthritis: Secondary | ICD-10-CM | POA: Diagnosis not present

## 2019-01-15 DIAGNOSIS — K219 Gastro-esophageal reflux disease without esophagitis: Secondary | ICD-10-CM

## 2019-01-15 DIAGNOSIS — R7303 Prediabetes: Secondary | ICD-10-CM

## 2019-01-15 MED ORDER — FLUTICASONE PROPIONATE 50 MCG/ACT NA SUSP: 2.0000 | Freq: Every day | NASAL | 6 refills | Status: DC

## 2019-01-15 NOTE — Assessment & Plan Note (Signed)
dexa up to date Stressed regular exercise Taking calcium and vitamin d

## 2019-01-15 NOTE — Assessment & Plan Note (Signed)
Persistent Rate controlled Following with cardiology On elquis,  metoprolol

## 2019-01-15 NOTE — Assessment & Plan Note (Addendum)
Low sugar / carb diet Stressed regular exercise No recent a1c but had recent lab work - will hold off on checking a1c and recheck in 6 months

## 2019-01-15 NOTE — Assessment & Plan Note (Signed)
Lipids controlled Continue crestor

## 2019-01-15 NOTE — Assessment & Plan Note (Signed)
Mild Tylenol as needed Topical arthritis medications

## 2019-01-15 NOTE — Assessment & Plan Note (Signed)
BP well controlled Current regimen effective and well tolerated Continue current medications at current doses cmp  

## 2019-01-15 NOTE — Assessment & Plan Note (Signed)
GERD controlled - occasional GERD Continue daily medication

## 2019-03-18 ENCOUNTER — Other Ambulatory Visit: Payer: Self-pay | Admitting: Internal Medicine

## 2019-03-19 ENCOUNTER — Other Ambulatory Visit: Payer: Self-pay | Admitting: Internal Medicine

## 2019-03-25 ENCOUNTER — Other Ambulatory Visit: Payer: Self-pay | Admitting: Cardiology

## 2019-04-27 ENCOUNTER — Other Ambulatory Visit: Payer: Self-pay | Admitting: Cardiology

## 2019-04-30 DIAGNOSIS — H0100A Unspecified blepharitis right eye, upper and lower eyelids: Secondary | ICD-10-CM | POA: Diagnosis not present

## 2019-04-30 DIAGNOSIS — H401131 Primary open-angle glaucoma, bilateral, mild stage: Secondary | ICD-10-CM | POA: Diagnosis not present

## 2019-04-30 DIAGNOSIS — H0100B Unspecified blepharitis left eye, upper and lower eyelids: Secondary | ICD-10-CM | POA: Diagnosis not present

## 2019-04-30 DIAGNOSIS — H02403 Unspecified ptosis of bilateral eyelids: Secondary | ICD-10-CM | POA: Diagnosis not present

## 2019-05-07 DIAGNOSIS — M545 Low back pain: Secondary | ICD-10-CM | POA: Diagnosis not present

## 2019-05-07 DIAGNOSIS — G8929 Other chronic pain: Secondary | ICD-10-CM | POA: Insufficient documentation

## 2019-05-31 DIAGNOSIS — M9903 Segmental and somatic dysfunction of lumbar region: Secondary | ICD-10-CM | POA: Diagnosis not present

## 2019-05-31 DIAGNOSIS — M9901 Segmental and somatic dysfunction of cervical region: Secondary | ICD-10-CM | POA: Diagnosis not present

## 2019-05-31 DIAGNOSIS — M545 Low back pain: Secondary | ICD-10-CM | POA: Diagnosis not present

## 2019-05-31 DIAGNOSIS — M9904 Segmental and somatic dysfunction of sacral region: Secondary | ICD-10-CM | POA: Diagnosis not present

## 2019-05-31 DIAGNOSIS — M542 Cervicalgia: Secondary | ICD-10-CM | POA: Diagnosis not present

## 2019-05-31 DIAGNOSIS — M62830 Muscle spasm of back: Secondary | ICD-10-CM | POA: Diagnosis not present

## 2019-06-04 DIAGNOSIS — M9903 Segmental and somatic dysfunction of lumbar region: Secondary | ICD-10-CM | POA: Diagnosis not present

## 2019-06-04 DIAGNOSIS — M545 Low back pain: Secondary | ICD-10-CM | POA: Diagnosis not present

## 2019-06-04 DIAGNOSIS — M9901 Segmental and somatic dysfunction of cervical region: Secondary | ICD-10-CM | POA: Diagnosis not present

## 2019-06-04 DIAGNOSIS — M542 Cervicalgia: Secondary | ICD-10-CM | POA: Diagnosis not present

## 2019-06-04 DIAGNOSIS — M9904 Segmental and somatic dysfunction of sacral region: Secondary | ICD-10-CM | POA: Diagnosis not present

## 2019-06-04 DIAGNOSIS — M62830 Muscle spasm of back: Secondary | ICD-10-CM | POA: Diagnosis not present

## 2019-06-06 DIAGNOSIS — M545 Low back pain: Secondary | ICD-10-CM | POA: Diagnosis not present

## 2019-06-06 DIAGNOSIS — M9903 Segmental and somatic dysfunction of lumbar region: Secondary | ICD-10-CM | POA: Diagnosis not present

## 2019-06-06 DIAGNOSIS — M62830 Muscle spasm of back: Secondary | ICD-10-CM | POA: Diagnosis not present

## 2019-06-06 DIAGNOSIS — M542 Cervicalgia: Secondary | ICD-10-CM | POA: Diagnosis not present

## 2019-06-06 DIAGNOSIS — M9901 Segmental and somatic dysfunction of cervical region: Secondary | ICD-10-CM | POA: Diagnosis not present

## 2019-06-07 DIAGNOSIS — M9903 Segmental and somatic dysfunction of lumbar region: Secondary | ICD-10-CM | POA: Diagnosis not present

## 2019-06-07 DIAGNOSIS — M62830 Muscle spasm of back: Secondary | ICD-10-CM | POA: Diagnosis not present

## 2019-06-07 DIAGNOSIS — M545 Low back pain: Secondary | ICD-10-CM | POA: Diagnosis not present

## 2019-06-10 ENCOUNTER — Other Ambulatory Visit: Payer: Self-pay | Admitting: Internal Medicine

## 2019-06-11 DIAGNOSIS — M9903 Segmental and somatic dysfunction of lumbar region: Secondary | ICD-10-CM | POA: Diagnosis not present

## 2019-06-11 DIAGNOSIS — M9901 Segmental and somatic dysfunction of cervical region: Secondary | ICD-10-CM | POA: Diagnosis not present

## 2019-06-11 DIAGNOSIS — M9904 Segmental and somatic dysfunction of sacral region: Secondary | ICD-10-CM | POA: Diagnosis not present

## 2019-06-11 DIAGNOSIS — M545 Low back pain: Secondary | ICD-10-CM | POA: Diagnosis not present

## 2019-06-11 DIAGNOSIS — M62830 Muscle spasm of back: Secondary | ICD-10-CM | POA: Diagnosis not present

## 2019-06-11 DIAGNOSIS — M542 Cervicalgia: Secondary | ICD-10-CM | POA: Diagnosis not present

## 2019-06-18 DIAGNOSIS — M545 Low back pain: Secondary | ICD-10-CM | POA: Diagnosis not present

## 2019-06-18 DIAGNOSIS — M9904 Segmental and somatic dysfunction of sacral region: Secondary | ICD-10-CM | POA: Diagnosis not present

## 2019-06-18 DIAGNOSIS — M9901 Segmental and somatic dysfunction of cervical region: Secondary | ICD-10-CM | POA: Diagnosis not present

## 2019-06-18 DIAGNOSIS — M62830 Muscle spasm of back: Secondary | ICD-10-CM | POA: Diagnosis not present

## 2019-06-18 DIAGNOSIS — M9903 Segmental and somatic dysfunction of lumbar region: Secondary | ICD-10-CM | POA: Diagnosis not present

## 2019-06-18 DIAGNOSIS — M542 Cervicalgia: Secondary | ICD-10-CM | POA: Diagnosis not present

## 2019-06-21 DIAGNOSIS — M62830 Muscle spasm of back: Secondary | ICD-10-CM | POA: Diagnosis not present

## 2019-06-21 DIAGNOSIS — M545 Low back pain: Secondary | ICD-10-CM | POA: Diagnosis not present

## 2019-06-21 DIAGNOSIS — M9904 Segmental and somatic dysfunction of sacral region: Secondary | ICD-10-CM | POA: Diagnosis not present

## 2019-06-21 DIAGNOSIS — M9903 Segmental and somatic dysfunction of lumbar region: Secondary | ICD-10-CM | POA: Diagnosis not present

## 2019-06-21 DIAGNOSIS — M542 Cervicalgia: Secondary | ICD-10-CM | POA: Diagnosis not present

## 2019-06-21 DIAGNOSIS — M9901 Segmental and somatic dysfunction of cervical region: Secondary | ICD-10-CM | POA: Diagnosis not present

## 2019-06-28 DIAGNOSIS — M9904 Segmental and somatic dysfunction of sacral region: Secondary | ICD-10-CM | POA: Diagnosis not present

## 2019-06-28 DIAGNOSIS — M542 Cervicalgia: Secondary | ICD-10-CM | POA: Diagnosis not present

## 2019-06-28 DIAGNOSIS — M545 Low back pain: Secondary | ICD-10-CM | POA: Diagnosis not present

## 2019-06-28 DIAGNOSIS — M62830 Muscle spasm of back: Secondary | ICD-10-CM | POA: Diagnosis not present

## 2019-06-28 DIAGNOSIS — M9901 Segmental and somatic dysfunction of cervical region: Secondary | ICD-10-CM | POA: Diagnosis not present

## 2019-06-28 DIAGNOSIS — M9903 Segmental and somatic dysfunction of lumbar region: Secondary | ICD-10-CM | POA: Diagnosis not present

## 2019-07-05 DIAGNOSIS — M542 Cervicalgia: Secondary | ICD-10-CM | POA: Diagnosis not present

## 2019-07-05 DIAGNOSIS — M9903 Segmental and somatic dysfunction of lumbar region: Secondary | ICD-10-CM | POA: Diagnosis not present

## 2019-07-05 DIAGNOSIS — M9904 Segmental and somatic dysfunction of sacral region: Secondary | ICD-10-CM | POA: Diagnosis not present

## 2019-07-05 DIAGNOSIS — M9901 Segmental and somatic dysfunction of cervical region: Secondary | ICD-10-CM | POA: Diagnosis not present

## 2019-07-05 DIAGNOSIS — M62830 Muscle spasm of back: Secondary | ICD-10-CM | POA: Diagnosis not present

## 2019-07-05 DIAGNOSIS — M545 Low back pain: Secondary | ICD-10-CM | POA: Diagnosis not present

## 2019-07-09 ENCOUNTER — Other Ambulatory Visit: Payer: Self-pay | Admitting: Internal Medicine

## 2019-07-10 NOTE — Progress Notes (Signed)
HPI: FU CAD and atrial myxoma resection. Echo 4/16 orderded by primary care for cardiomegaly; this revealed normal LV function, left atrial mass; mild MR. Cardiac cath 5/16 showed 80 LAD. Preop carotid dopplers 5/16 with no significant stenosis. Patient had CABG with LIMA to LAD, resection of atrial myxoma and LAA oversewn. Patient had recurrent atrial fibrillation in September 2016. She has been treated with rate controlling medications and anticoagulation. Holter 4/18 showed atrial fibrillation, rate mildly decreased late PM and early AM but otherwise controlled. Echocardiogram January 2020 showed normal LV function, moderate left ventricular hypertrophy, trace aortic insufficiency, mildly dilated aortic root and severe left atrial enlargement.  Since last seen,there is no dyspnea, chest pain, palpitations or syncope.  Rare nosebleed.  Current Outpatient Medications  Medication Sig Dispense Refill  . calcium citrate-vitamin D (CITRACAL+D) 315-200 MG-UNIT per tablet Take 2 tablets by mouth 2 (two) times daily.    . Cholecalciferol (VITAMIN D3) 1000 UNITS CAPS Take 1,000 Units by mouth daily.     . Coenzyme Q10 (CO Q 10 PO) Take by mouth daily.    . COMBIVENT RESPIMAT 20-100 MCG/ACT AERS respimat Inhale 1 puff into the lungs as needed. 4 g 5  . dicyclomine (BENTYL) 20 MG tablet TAKE 1 TABLET (20 MG TOTAL) BY MOUTH EVERY 6 (SIX) HOURS AS NEEDED FOR SPASMS. 60 tablet 11  . ELIQUIS 5 MG TABS tablet TAKE 1 TABLET BY MOUTH TWICE A DAY 180 tablet 1  . fexofenadine (ALLEGRA) 180 MG tablet Take 180 mg by mouth daily. Pt takes daily    . fluticasone (FLONASE) 50 MCG/ACT nasal spray Place 2 sprays into both nostrils daily. 16 g 6  . furosemide (LASIX) 40 MG tablet TAKE 1 TABLET BY MOUTH EVERY DAY 90 tablet 0  . latanoprost (XALATAN) 0.005 % ophthalmic solution Place 1 drop into both eyes daily.      Marland Kitchen losartan (COZAAR) 25 MG tablet TAKE 1 TABLET (25 MG TOTAL) BY MOUTH DAILY. 90 tablet 3  .  metoprolol tartrate (LOPRESSOR) 25 MG tablet 2 tablets in the morning and 1 tablet in the evening 270 tablet 3  . montelukast (SINGULAIR) 10 MG tablet TAKE 1 TABLET AT BEDTIME 90 tablet 3  . omeprazole (PRILOSEC) 20 MG capsule TAKE 1 CAPSULE BY MOUTH EVERY DAY 90 capsule 1  . traMADol (ULTRAM) 50 MG tablet Take 1 tablet (50 mg total) by mouth every 6 (six) hours as needed. Dr.Ramos (Patient taking differently: Take 50 mg by mouth every 6 (six) hours as needed. Dr.Ramos; pt takes 1 tablet every night, maybe 1 during the day as needed) 30 tablet 0  . rosuvastatin (CRESTOR) 10 MG tablet Take 1 tablet (10 mg total) by mouth daily. 30 tablet 8   No current facility-administered medications for this visit.      Past Medical History:  Diagnosis Date  . Ankle fracture, left 11/12/2011  . Atrial fibrillation ([place])    Post op after repair of ankle fracture  . 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 CAD (coronary artery disease) 03/2015   LAD 80%, otherwise minimal CAD. s/p LIMA-LAD  . Diverticulitis 2002   based on CT scan  . Diverticulosis    Dr Henrene Pastor  . DVT (deep venous thrombosis) ([place]) 2000   no trigger  . Endometriosis   . Gastritis 2006   @ Endo  . GERD (gastroesophageal reflux disease)   . Hiatal hernia   .  Hyperlipidemia   . Hypertension   . IBS (irritable bowel syndrome)   . Ocular hypertension    glaucoma suspect, Dr. Kathrin Penner  . Skin cancer   . Superficial phlebitis     X 2, 1999, 2000  . Syncope 11/12/11   in context CAP . Carotid Doppler exam was negative. 2D echocardiogram revealed mild dilation of the left atrium and moderate increase in the systolic pressureof  the pulmonary artery  . UTI (lower urinary tract infection) 01/2013   Strep bovis ; PCN sensitive    Past Surgical History:  Procedure Laterality Date  . CARDIAC CATHETERIZATION N/A 03/28/2015   Procedure: Left Heart Cath and Coronary Angiography;  Surgeon: Jolaine Artist, MD;  Location: [facility]ASIVE [facility];  Service: Cardiovascular;  Laterality: N/A;  . CATARACT EXTRACTION Bilateral   . CATARACT EXTRACTION W/ INTRAOCULAR LENS IMPLANT     OS; Dr Kathrin Penner  . COLONOSCOPY  1995, 2002, 2012   diverticulosis; Dr Henrene Pastor  . CORONARY ARTERY BYPASS GRAFT N/A 04/01/2015   Procedure: CORONARY ARTERY BYPASS GRAFTING (CABG), ON PUMP, TIMES ONE, USING LEFT INTERNAL MAMMARY ARTERY;  Surgeon: Ivin Poot, MD;  Location: [facility];  Service: Open Heart Surgery;  Laterality: N/A;  . EXCISION OF ATRIAL MYXOMA N/A 04/01/2015   Procedure: EXCISION OF ATRIAL MYXOMA;  Surgeon: Ivin Poot, MD;  Location: [facility];  Service: Open Heart Surgery;  Laterality: N/A;  . ORIF ANKLE FRACTURE  11/14/2011   Procedure: OPEN REDUCTION INTERNAL FIXATION (ORIF) ANKLE FRACTURE;  Surgeon: Colin Rhein;  Location[facility];  Service: Orthopedics;  Laterality: Left;  open reduction internal fixation trimalleolar ankle fracture  . TEE WITHOUT CARDIOVERSION N/A 04/01/2015   Procedure: TRANSESOPHAGEAL ECHOCARDIOGRAM (TEE);  Surgeon: Ivin Poot, MD;  Location: [facility];  Service: Open Heart Surgery;  Laterality: N/A;  . TONSILLECTOMY    . TOTAL ABDOMINAL HYSTERECTOMY W/ BILATERAL SALPINGOOPHORECTOMY  1980   dysfunctional menses, endometriosis  . [place], 2009    Social History   Socioeconomic History  . Marital status: Married    Spouse name: Not on file  . Number of children: 2  . Years of education: Not on file  . Highest education level: Not on file  Occupational History  . Occupation: Retired    Fish farm manager: RETIRED  Social Needs  . Financial resource strain: Not on file  . Food insecurity    Worry: Not on file    Inability: Not on file  . Transportation needs    Medical: Not on file    Non-medical: Not on file  Tobacco Use  . Smoking status: Never Smoker  . Smokeless tobacco: Never Used  Substance and Sexual Activity  . Alcohol use: No   . Drug use: No  . Sexual activity: Not on file  Lifestyle  . Physical activity    Days per week: Not on file    Minutes per session: Not on file  . Stress: Not on file  Relationships  . Social Herbalist on phone: Not on file    Gets together: Not on file    Attends religious service: Not on file    Active member of club or organization: Not on file    Attends meetings of clubs or organizations: Not on file    Relationship status: Not on file  . Intimate partner violence    Fear of current or ex partner: Not on file  Emotionally abused: Not on file    Physically abused: Not on file    Forced sexual activity: Not on file  Other Topics Concern  . Not on file  Social History Narrative   REG EXERCISE   HAD COLONOSCOPY AND ENDOSCOPY DONE DATES UNKNOWN    Family History  Problem Relation Age of Onset  . Heart attack Father 80  . Alzheimer's disease Mother 64       TIAs  . Colon cancer Maternal Grandmother   . Heart attack Paternal Grandmother 82  . Hyperlipidemia Brother   . Hypertension Brother   . Diverticulitis Daughter        S/P colectomy  . Diabetes Neg Hx   . Stroke Neg Hx     ROS: no fevers or chills, productive cough, hemoptysis, dysphasia, odynophagia, melena, hematochezia, dysuria, hematuria, rash, seizure activity, orthopnea, PND, pedal edema, claudication. Remaining systems are negative.  Physical Exam: Well-developed well-nourished in no acute distress.  Skin is warm and dry.  HEENT is normal.  Neck is supple.  Chest is clear to auscultation with normal expansion.  Cardiovascular exam is irregular Abdominal exam nontender or distended. No masses palpated. Extremities show no edema. neuro grossly intact  ECG-atrial fibrillation at a rate of 90, right bundle branch block.  Personally reviewed  A/P  1 permanent atrial fibrillation-continue beta-blocker for rate control.  Continue apixaban at present dose.  2 hypertension-patient's blood  pressure is controlled.  Continue present medications and follow.  3 hyperlipidemia-continue statin.  4 history of atrial myxoma-status post resection.  No recurrence on most recent echocardiogram.  5 coronary artery disease status post coronary artery bypass and graft-continue statin.  No aspirin given need for apixaban.  6 peripheral edema-continue present dose of Lasix.  Kirk Ruths, MD

## 2019-07-15 NOTE — Patient Instructions (Addendum)
  Tests ordered today. Your results will be released to [facility] (or called to you) after review.  If any changes need to be made, you will be notified at that same time.  A prolia injection was given.   Flu immunization administered today.      Medications reviewed and updated.  Changes include :  Try albuterol inhaler instead of the combivent.  Your allergy medications were sent to your pharmacy.    Your prescription(s) have been submitted to your pharmacy. Please take as directed and contact our office if you believe you are having problem(s) with the medication(s).   Please followup in 6 months

## 2019-07-15 NOTE — Progress Notes (Signed)
Subjective:    Patient ID: Valerie Rollins, female    DOB: 07-08-1936, 83 y.o.   MRN: [id number]  HPI The patient is here for follow up.  She is not exercising regularly.     CAD, Afib, Hypertension: She is taking her medication daily. She is compliant with a low sodium diet.  She denies chest pain, palpitations, shortness of breath and regular headaches. She does not monitor her blood pressure at home.    Prediabetes:  She is compliant with a low sugar/carbohydrate diet.  She is not exercising regularly.  Hyperlipidemia: She is taking her medication daily. She is compliant with a low fat/cholesterol diet. She denies myalgias.   GERD:  She is taking her medication daily as prescribed.  She denies any GERD symptoms and feels her GERD is well controlled.   Edema, b/l LE:  She takes lasix 40 mg daily.  She is wearing compressive hose.  This is helping with her edema.  She feels her edema overall is stable and controlled.   Back pain:  She is still seeing the chiropractor.  She has been seeing a chiropractor more regularly.  She wondered if she should see an orthopedic at some point.   Allergies, ? Asthma:  She uses flonase and allegra daily.  She takes singular nightly.  She has been using Combivent as needed, but this is very expensive.  She has been on it for years.  It does work well.  She does not recall ever using albuterol.  She is never officially been diagnosed with asthma.  She has no history of smoking.  She denies any current cough, wheeze or shortness of breath.    Medications and allergies reviewed with patient and updated if appropriate.  Patient Active Problem List   Diagnosis Date Noted  . Skin yeast infection 04/20/2018  . Angular stomatitis 04/20/2018  . Seasonal allergic rhinitis due to pollen 03/21/2018  . Bilateral leg edema 12/31/2016  . Prediabetes 01/10/2016  . Osteopenia 12/26/2015  . Mass of neck 07/07/2015  . Encounter for therapeutic drug monitoring  05/14/2015  . Long-term (current) use of anticoagulants 04/16/2015  . S/P CABG x 1 04/01/2015  . CAD (coronary artery disease) 03/28/2015  . 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 Cardiomegaly 02/20/2015  . DDD (degenerative disc disease), lumbar 07/20/2012  . Atrial fibrillation ([place]) 11/15/2011  . Syncope 11/12/2011  . RBBB (right bundle branch block) 11/12/2011  . HTN (hypertension), malignant 11/12/2011  . HOARSENESS 02/23/2010  . SKIN CANCER, HX OF 11/18/2009  . Diverticulosis of large intestine 10/31/2008  . HEART MURMUR, SYSTOLIC [id number]  . VARICOSE VEINS, LOWER EXTREMITIES 03/13/2008  . GERD 02/20/2008  . IBS 02/20/2008  . Osteoarthritis 02/20/2008  . Hyperlipidemia 12/01/2006  . Essential hypertension 12/01/2006  . SUPERFICIAL THROMBOPHLEBITIS 12/01/2006  . DVT, HX OF 12/01/2006    Current Outpatient Medications on File Prior to Visit  Medication Sig Dispense Refill  . calcium citrate-vitamin D (CITRACAL+D) 315-200 MG-UNIT per tablet Take 2 tablets by mouth 2 (two) times daily.    . Cholecalciferol (VITAMIN D3) 1000 UNITS CAPS Take 1,000 Units by mouth daily.     . Coenzyme Q10 (CO Q 10 PO) Take by mouth daily.    . COMBIVENT RESPIMAT 20-100 MCG/ACT AERS respimat Inhale 1 puff into the lungs as needed. 4 g 5  . dicyclomine (BENTYL) 20 MG tablet TAKE 1 TABLET (20 MG TOTAL) BY MOUTH EVERY 6 (SIX) HOURS AS NEEDED FOR SPASMS. 60 tablet 11  .  ELIQUIS 5 MG TABS tablet TAKE 1 TABLET BY MOUTH TWICE A DAY 180 tablet 1  . fexofenadine (ALLEGRA) 180 MG tablet Take 180 mg by mouth daily. Pt takes daily    . fluticasone (FLONASE) 50 MCG/ACT nasal spray Place 2 sprays into both nostrils daily. 16 g 6  . furosemide (LASIX) 40 MG tablet TAKE 1 TABLET BY MOUTH EVERY DAY 90 tablet 0  . latanoprost (XALATAN) 0.005 % ophthalmic solution Place 1 drop into both eyes daily.      Marland Kitchen losartan (COZAAR) 25 MG tablet TAKE 1 TABLET (25 MG TOTAL) BY MOUTH DAILY. 90 tablet 3  . metoprolol tartrate (LOPRESSOR)  25 MG tablet 2 tablets in the morning and 1 tablet in the evening 270 tablet 3  . montelukast (SINGULAIR) 10 MG tablet TAKE 1 TABLET AT BEDTIME 90 tablet 3  . omeprazole (PRILOSEC) 20 MG capsule TAKE 1 CAPSULE BY MOUTH EVERY DAY 90 capsule 1  . traMADol (ULTRAM) 50 MG tablet Take 1 tablet (50 mg total) by mouth every 6 (six) hours as needed. Dr.Ramos (Patient taking differently: Take 50 mg by mouth every 6 (six) hours as needed. Dr.Ramos; pt takes 1 tablet every night, maybe 1 during the day as needed) 30 tablet 0   No current facility-administered medications on file prior to visit.     Past Medical History:  Diagnosis Date  . Ankle fracture, left 11/12/2011  . Atrial fibrillation ([place])    Post op after repair of ankle fracture  . 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 CAD (coronary artery disease) 03/2015   LAD 80%, otherwise minimal CAD. s/p LIMA-LAD  . Diverticulitis 2002   based on CT scan  . Diverticulosis    Dr Henrene Pastor  . DVT (deep venous thrombosis) ([place]) 2000   no trigger  . Endometriosis   . Gastritis 2006   @ Endo  . GERD (gastroesophageal reflux disease)   . Hiatal hernia   . Hyperlipidemia   . Hypertension   . IBS (irritable bowel syndrome)   . Ocular hypertension    glaucoma suspect, Dr. Kathrin Penner  . Skin cancer   . Superficial phlebitis     X 2, 1999, 2000  . Syncope 11/12/11   in context CAP . Carotid Doppler exam was negative. 2D echocardiogram revealed mild dilation of the left atrium and moderate increase in the systolic pressureof  the pulmonary artery  . UTI (lower urinary tract infection) 01/2013   Strep bovis ; PCN sensitive    Past Surgical History:  Procedure Laterality Date  . CARDIAC CATHETERIZATION N/A 03/28/2015   Procedure: Left Heart Cath and Coronary Angiography;  Surgeon: Jolaine Artist, MD;  Location: [facility];  Service: Cardiovascular;  Laterality: N/A;  . CATARACT EXTRACTION Bilateral    . CATARACT EXTRACTION W/ INTRAOCULAR LENS IMPLANT     OS; Dr Kathrin Penner  . COLONOSCOPY  1995, 2002, 2012   diverticulosis; Dr Henrene Pastor  . CORONARY ARTERY BYPASS GRAFT N/A 04/01/2015   Procedure: CORONARY ARTERY BYPASS GRAFTING (CABG), ON PUMP, TIMES ONE, USING LEFT INTERNAL MAMMARY ARTERY;  Surgeon: Ivin Poot, MD;  Location: [facility];  Service: Open Heart Surgery;  Laterality: N/A;  . EXCISION OF ATRIAL MYXOMA N/A 04/01/2015   Procedure: EXCISION OF ATRIAL MYXOMA;  Surgeon: Ivin Poot, MD;  Location: [facility];  Service: Open Heart Surgery;  Laterality: N/A;  . ORIF ANKLE FRACTURE  11/14/2011   Procedure:  OPEN REDUCTION INTERNAL FIXATION (ORIF) ANKLE FRACTURE;  Surgeon: Valerie Rhein;  [facility];  Service: Orthopedics;  Laterality: Left;  open reduction internal fixation trimalleolar ankle fracture  . TEE WITHOUT CARDIOVERSION N/A 04/01/2015   Procedure: TRANSESOPHAGEAL ECHOCARDIOGRAM (TEE);  Surgeon: Ivin Poot, MD;  Location: [facility];  Service: Open Heart Surgery;  Laterality: N/A;  . TONSILLECTOMY    . TOTAL ABDOMINAL HYSTERECTOMY W/ BILATERAL SALPINGOOPHORECTOMY  1980   dysfunctional menses, endometriosis  . [place], 2009    Social History   Socioeconomic History  . Marital status: Married    Spouse name: Not on file  . Number of children: 2  . Years of education: Not on file  . Highest education level: Not on file  Occupational History  . Occupation: Retired    Fish farm manager: RETIRED  Social Needs  . Financial resource strain: Not on file  . Food insecurity    Worry: Not on file    Inability: Not on file  . Transportation needs    Medical: Not on file    Non-medical: Not on file  Tobacco Use  . Smoking status: Never Smoker  . Smokeless tobacco: Never Used  Substance and Sexual Activity  . Alcohol use: No  . Drug use: No  . Sexual activity: Not on file  Lifestyle  . Physical activity    Days per week: Not on file    Minutes per  session: Not on file  . Stress: Not on file  Relationships  . Social Herbalist on phone: Not on file    Gets together: Not on file    Attends religious service: Not on file    Active member of club or organization: Not on file    Attends meetings of clubs or organizations: Not on file    Relationship status: Not on file  Other Topics Concern  . Not on file  Social History Narrative   REG EXERCISE   HAD COLONOSCOPY AND ENDOSCOPY DONE DATES UNKNOWN    Family History  Problem Relation Age of Onset  . Heart attack Father 74  . Alzheimer's disease Mother 31       TIAs  . Colon cancer Maternal Grandmother   . Heart attack Paternal Grandmother 82  . Hyperlipidemia Brother   . Hypertension Brother   . Diverticulitis Daughter        S/P colectomy  . Diabetes Neg Hx   . Stroke Neg Hx     Review of Systems  Constitutional: Negative for chills and fever.  Respiratory: Negative for cough, shortness of breath and wheezing.   Cardiovascular: Positive for leg swelling (chronic, better with compression sock). Negative for chest pain and palpitations.  Musculoskeletal: Positive for back pain.  Neurological: Positive for headaches (occ). Negative for light-headedness.       Objective:   Vitals:   07/16/19 1404  BP: 134/84  Pulse: 96  Resp: 16  Temp: 98.1 F (36.7 C)  SpO2: 99%   BP Readings from Last 3 Encounters:  07/16/19 134/84  07/16/19 130/82  01/15/19 134/82   Wt Readings from Last 3 Encounters:  07/16/19 169 lb (76.7 kg)  07/16/19 169 lb (76.7 kg)  01/15/19 174 lb (78.9 kg)   Body mass index is 27.7 kg/m.   Physical Exam    Constitutional: Appears well-developed and well-nourished. No distress.  HENT:  Head: Normocephalic and atraumatic.  Neck: Neck supple. No tracheal deviation  present. No thyromegaly present.  No cervical lymphadenopathy Cardiovascular: Normal rate, regular rhythm and normal heart sounds.   No murmur heard. No carotid bruit .   1+ edema-wearing compression hose Pulmonary/Chest: Effort normal and breath sounds normal. No respiratory distress. No has no wheezes. No rales.  Skin: Skin is warm and dry. Not diaphoretic.  Psychiatric: Normal mood and affect. Behavior is normal.      Assessment & Plan:    See Problem List for Assessment and Plan of chronic medical problems.

## 2019-07-16 ENCOUNTER — Encounter: Payer: Self-pay | Admitting: Cardiology

## 2019-07-16 ENCOUNTER — Other Ambulatory Visit (INDEPENDENT_AMBULATORY_CARE_PROVIDER_SITE_OTHER): Payer: Medicare HMO

## 2019-07-16 ENCOUNTER — Ambulatory Visit: Payer: Medicare HMO | Admitting: Cardiology

## 2019-07-16 ENCOUNTER — Encounter: Payer: Self-pay | Admitting: Internal Medicine

## 2019-07-16 ENCOUNTER — Ambulatory Visit (INDEPENDENT_AMBULATORY_CARE_PROVIDER_SITE_OTHER): Payer: Medicare HMO | Admitting: Internal Medicine

## 2019-07-16 ENCOUNTER — Other Ambulatory Visit: Payer: Self-pay

## 2019-07-16 VITALS — BP 130/82 | HR 90 | Temp 97.2°F | Ht 65.5 in | Wt 169.0 lb

## 2019-07-16 VITALS — BP 134/84 | HR 96 | Temp 98.1°F | Resp 16 | Ht 65.5 in | Wt 169.0 lb

## 2019-07-16 DIAGNOSIS — R7303 Prediabetes: Secondary | ICD-10-CM

## 2019-07-16 DIAGNOSIS — I4891 Unspecified atrial fibrillation: Secondary | ICD-10-CM

## 2019-07-16 DIAGNOSIS — I2510 Atherosclerotic heart disease of native coronary artery without angina pectoris: Secondary | ICD-10-CM

## 2019-07-16 DIAGNOSIS — D151 Benign neoplasm of heart: Secondary | ICD-10-CM

## 2019-07-16 DIAGNOSIS — E7800 Pure hypercholesterolemia, unspecified: Secondary | ICD-10-CM

## 2019-07-16 DIAGNOSIS — I4821 Permanent atrial fibrillation: Secondary | ICD-10-CM

## 2019-07-16 DIAGNOSIS — M810 Age-related osteoporosis without current pathological fracture: Secondary | ICD-10-CM | POA: Diagnosis not present

## 2019-07-16 DIAGNOSIS — M8580 Other specified disorders of bone density and structure, unspecified site: Secondary | ICD-10-CM | POA: Diagnosis not present

## 2019-07-16 DIAGNOSIS — I10 Essential (primary) hypertension: Secondary | ICD-10-CM

## 2019-07-16 DIAGNOSIS — Z23 Encounter for immunization: Secondary | ICD-10-CM | POA: Diagnosis not present

## 2019-07-16 DIAGNOSIS — E782 Mixed hyperlipidemia: Secondary | ICD-10-CM | POA: Diagnosis not present

## 2019-07-16 LAB — CBC WITH DIFFERENTIAL/PLATELET
Basophils Absolute: 0.1 10*3/uL (ref 0.0–0.1)
Basophils Relative: 0.8 % (ref 0.0–3.0)
Eosinophils Absolute: 0.1 10*3/uL (ref 0.0–0.7)
Eosinophils Relative: 1.4 % (ref 0.0–5.0)
HCT: 44.4 % (ref 36.0–46.0)
Hemoglobin: 15.1 g/dL — ABNORMAL HIGH (ref 12.0–15.0)
Lymphocytes Relative: 24.9 % (ref 12.0–46.0)
Lymphs Abs: 1.8 10*3/uL (ref 0.7–4.0)
MCHC: 33.9 g/dL (ref 30.0–36.0)
MCV: 89.3 fl (ref 78.0–100.0)
Monocytes Absolute: 0.7 10*3/uL (ref 0.1–1.0)
Monocytes Relative: 9.4 % (ref 3.0–12.0)
Neutro Abs: 4.5 10*3/uL (ref 1.4–7.7)
Neutrophils Relative %: 63.5 % (ref 43.0–77.0)
Platelets: 202 10*3/uL (ref 150.0–400.0)
RBC: 4.97 Mil/uL (ref 3.87–5.11)
RDW: 13 % (ref 11.5–15.5)
WBC: 7.1 10*3/uL (ref 4.0–10.5)

## 2019-07-16 LAB — HEMOGLOBIN A1C: Hgb A1c MFr Bld: 5.9 % (ref 4.6–6.5)

## 2019-07-16 LAB — COMPREHENSIVE METABOLIC PANEL
ALT: 19 U/L (ref 0–35)
AST: 27 U/L (ref 0–37)
Albumin: 5 g/dL (ref 3.5–5.2)
Alkaline Phosphatase: 56 U/L (ref 39–117)
BUN: 29 mg/dL — ABNORMAL HIGH (ref 6–23)
CO2: 31 mEq/L (ref 19–32)
Calcium: 11.4 mg/dL — ABNORMAL HIGH (ref 8.4–10.5)
Chloride: 101 mEq/L (ref 96–112)
Creatinine, Ser: 1.09 mg/dL (ref 0.40–1.20)
GFR: 48.18 mL/min — ABNORMAL LOW (ref >60.00)
Glucose, Bld: 96 mg/dL (ref 70–99)
Potassium: 4.4 mEq/L (ref 3.5–5.1)
Sodium: 142 mEq/L (ref 135–145)
Total Bilirubin: 0.8 mg/dL (ref 0.2–1.2)
Total Protein: 7.4 g/dL (ref 6.0–8.3)

## 2019-07-16 LAB — LIPID PANEL
Cholesterol: 165 mg/dL (ref 0–200)
HDL: 89.6 mg/dL (ref >39.00)
LDL Cholesterol: 61 mg/dL (ref 0–99)
NonHDL: 75.77
Total CHOL/HDL Ratio: 2
Triglycerides: 72 mg/dL (ref 0.0–149.0)
VLDL: 14.4 mg/dL (ref 0.0–40.0)

## 2019-07-16 MED ORDER — DENOSUMAB 60 MG/ML ~~LOC~~ SOSY: 60.0000 mg | PREFILLED_SYRINGE | Freq: Once | SUBCUTANEOUS | 0 refills | Status: DC

## 2019-07-16 MED ORDER — ALBUTEROL SULFATE HFA 108 (90 BASE) MCG/ACT IN AERS: 2.0000 | INHALATION_SPRAY | Freq: Four times a day (QID) | RESPIRATORY_TRACT | 5 refills | Status: DC | PRN

## 2019-07-16 MED ORDER — FLUTICASONE PROPIONATE 50 MCG/ACT NA SUSP: 2.0000 | Freq: Every day | NASAL | 6 refills | Status: DC

## 2019-07-16 MED ORDER — DENOSUMAB 60 MG/ML ~~LOC~~ SOSY
60.0000 mg | PREFILLED_SYRINGE | Freq: Once | SUBCUTANEOUS | Status: AC
  Administered 2019-07-16: 15:00:00 60 mg via SUBCUTANEOUS

## 2019-07-16 MED ORDER — ROSUVASTATIN CALCIUM 10 MG PO TABS: 10.0000 mg | ORAL_TABLET | Freq: Every day | ORAL | 3 refills | Status: DC

## 2019-07-16 MED ORDER — FEXOFENADINE HCL 180 MG PO TABS: 180.0000 mg | ORAL_TABLET | Freq: Every day | ORAL | 11 refills | Status: DC

## 2019-07-16 NOTE — Assessment & Plan Note (Signed)
BP well controlled Current regimen effective and well tolerated Continue current medications at current doses cmp  

## 2019-07-16 NOTE — Assessment & Plan Note (Signed)
Check a1c Low sugar / carb diet Stressed regular exercise   

## 2019-07-16 NOTE — Patient Instructions (Signed)

## 2019-07-16 NOTE — Assessment & Plan Note (Signed)
Following with cardiology On Eliquis, metoprolol CBC, CMP

## 2019-07-16 NOTE — Assessment & Plan Note (Signed)
DEXA up-to-date Encourage regular exercise Prolia injection due-received today Continue calcium and vitamin D

## 2019-07-16 NOTE — Assessment & Plan Note (Signed)
Check lipid panel  Continue daily statin Regular exercise and healthy diet encouraged  

## 2019-07-17 LAB — TSH: TSH: 3.07 u[IU]/mL (ref 0.35–4.50)

## 2019-07-19 ENCOUNTER — Other Ambulatory Visit: Payer: Self-pay | Admitting: Internal Medicine

## 2019-07-19 DIAGNOSIS — N183 Chronic kidney disease, stage 3 unspecified: Secondary | ICD-10-CM | POA: Insufficient documentation

## 2019-07-19 DIAGNOSIS — R944 Abnormal results of kidney function studies: Secondary | ICD-10-CM

## 2019-07-19 DIAGNOSIS — N1831 Chronic kidney disease, stage 3a: Secondary | ICD-10-CM | POA: Insufficient documentation

## 2019-07-20 ENCOUNTER — Telehealth: Payer: Self-pay | Admitting: *Deleted

## 2019-07-20 ENCOUNTER — Ambulatory Visit: Payer: Self-pay

## 2019-07-20 ENCOUNTER — Encounter: Payer: Self-pay | Admitting: *Deleted

## 2019-07-20 NOTE — Telephone Encounter (Signed)
Copied from [place] [id number]. Topic: General - Inquiry >> Jul 20, 2019  3:11 PM Richardo Priest, [place] wrote: Reason for CRM: Patient called in wanting to have lab results mailed to her. Please advise.

## 2019-07-20 NOTE — Telephone Encounter (Signed)
Provided lab result per Dr.  Marzetta Board of 07/19/19. Patient voiced understanding.   Understood that seen needs to come back in for blood work in 3 weeks.

## 2019-07-20 NOTE — Telephone Encounter (Signed)
Lab letter mailed to pt.

## 2019-07-23 DIAGNOSIS — M545 Low back pain: Secondary | ICD-10-CM | POA: Diagnosis not present

## 2019-07-23 DIAGNOSIS — M9904 Segmental and somatic dysfunction of sacral region: Secondary | ICD-10-CM | POA: Diagnosis not present

## 2019-07-23 DIAGNOSIS — M542 Cervicalgia: Secondary | ICD-10-CM | POA: Diagnosis not present

## 2019-07-23 DIAGNOSIS — M9901 Segmental and somatic dysfunction of cervical region: Secondary | ICD-10-CM | POA: Diagnosis not present

## 2019-07-23 DIAGNOSIS — M62830 Muscle spasm of back: Secondary | ICD-10-CM | POA: Diagnosis not present

## 2019-07-23 DIAGNOSIS — M9903 Segmental and somatic dysfunction of lumbar region: Secondary | ICD-10-CM | POA: Diagnosis not present

## 2019-07-25 ENCOUNTER — Telehealth: Payer: Self-pay

## 2019-07-25 NOTE — Telephone Encounter (Signed)
Returned patients call. Pt advised that she got her results in the mail and had her question answered. Wanted to clarify how long to stop calcium.

## 2019-07-25 NOTE — Telephone Encounter (Signed)
Copied from [place]. Topic: General - Other >> Jul 25, 2019 11:43 AM Keene Breath wrote: Reason for CRM: Patient called to speak with Lovena Le regarding a blood test that the doctor wants her to have.  CB# [phone]

## 2019-07-26 DIAGNOSIS — M48061 Spinal stenosis, lumbar region without neurogenic claudication: Secondary | ICD-10-CM | POA: Diagnosis not present

## 2019-07-26 DIAGNOSIS — M545 Low back pain: Secondary | ICD-10-CM | POA: Diagnosis not present

## 2019-07-31 ENCOUNTER — Other Ambulatory Visit: Payer: Self-pay

## 2019-07-31 MED ORDER — DICYCLOMINE HCL 20 MG PO TABS: ORAL_TABLET | ORAL | 3 refills | Status: DC

## 2019-08-08 DIAGNOSIS — M545 Low back pain: Secondary | ICD-10-CM | POA: Diagnosis not present

## 2019-08-15 ENCOUNTER — Other Ambulatory Visit: Payer: Self-pay | Admitting: Internal Medicine

## 2019-08-17 ENCOUNTER — Other Ambulatory Visit (INDEPENDENT_AMBULATORY_CARE_PROVIDER_SITE_OTHER): Payer: Medicare HMO

## 2019-08-17 DIAGNOSIS — R944 Abnormal results of kidney function studies: Secondary | ICD-10-CM | POA: Diagnosis not present

## 2019-08-17 LAB — BASIC METABOLIC PANEL
BUN: 22 mg/dL (ref 6–23)
CO2: 30 mEq/L (ref 19–32)
Calcium: 9.8 mg/dL (ref 8.4–10.5)
Chloride: 103 mEq/L (ref 96–112)
Creatinine, Ser: 1.03 mg/dL (ref 0.40–1.20)
GFR: 51.42 mL/min — ABNORMAL LOW (ref >60.00)
Glucose, Bld: 80 mg/dL (ref 70–99)
Potassium: 4.3 mEq/L (ref 3.5–5.1)
Sodium: 144 mEq/L (ref 135–145)

## 2019-08-20 DIAGNOSIS — R69 Illness, unspecified: Secondary | ICD-10-CM | POA: Diagnosis not present

## 2019-08-21 LAB — PTH, INTACT AND CALCIUM
Calcium: 9.5 mg/dL (ref 8.6–10.4)
PTH: 78 pg/mL — ABNORMAL HIGH (ref 14–64)

## 2019-08-30 ENCOUNTER — Telehealth: Payer: Self-pay | Admitting: *Deleted

## 2019-08-30 ENCOUNTER — Encounter: Payer: Self-pay | Admitting: *Deleted

## 2019-08-30 DIAGNOSIS — R7989 Other specified abnormal findings of blood chemistry: Secondary | ICD-10-CM

## 2019-08-30 NOTE — Telephone Encounter (Signed)
Pt informed of below. She is agreeable to Endocrinology referral.    Notes recorded by Binnie Rail, MD on 08/21/2019 at 12:59 PM EDT  Her kidney function is better than 1 month and now slightly reduced, but at baseline. Her calcium is normal, but her parathyroid hormone is elevated. She may have an overactive parathyroid gland - this should be evaluated further - I would like her to see endocrine for this if she agrees.

## 2019-08-30 NOTE — Telephone Encounter (Signed)
Copied from [place] [phone]. Topic: General - Inquiry >> Aug 30, 2019  3:40 PM Richardo Priest, NT wrote: Reason for CRM: Patient called back in regards to lab work. Advised that CMA is gone for today. Patient stated she would also like to have results mailed to her as well. Please advise and call patient back.

## 2019-08-31 ENCOUNTER — Telehealth: Payer: Self-pay

## 2019-08-31 NOTE — Telephone Encounter (Signed)
Copied from [place] [phone]. Topic: General - Inquiry >> Aug 30, 2019  3:40 PM Richardo Priest, NT wrote: Reason for CRM: Patient called back in regards to lab work. Advised that CMA is gone for today. Patient stated she would also like to have results mailed to her as well. Please advise and call patient back.

## 2019-08-31 NOTE — Telephone Encounter (Signed)
Pt aware of results 

## 2019-09-05 NOTE — Telephone Encounter (Signed)
Pt aware and expressed understanding.  

## 2019-09-05 NOTE — Telephone Encounter (Signed)
She should hold her calcium.  She can take plain vitamin D if she wants.  She will be seeing endocrine and they will advise after further evaluation if she can take the calcium or not.

## 2019-09-05 NOTE — Telephone Encounter (Signed)
Calcium level was normal. Would you still recommend pt take calcium?

## 2019-09-05 NOTE — Telephone Encounter (Signed)
Pt called and stated that she would like a call back from taylor regarding labs. Pt would like to know if she should still take calcium. Please advise   Cb#[phone]

## 2019-09-10 ENCOUNTER — Other Ambulatory Visit: Payer: Self-pay

## 2019-09-10 MED ORDER — DICYCLOMINE HCL 20 MG PO TABS: ORAL_TABLET | ORAL | 3 refills | Status: DC

## 2019-09-12 ENCOUNTER — Telehealth: Payer: Self-pay | Admitting: *Deleted

## 2019-09-12 NOTE — Telephone Encounter (Signed)
   [facility] Pre-operative Risk Assessment    Request for surgical clearance:  1. What type of surgery is being performed? Lumbar ESI  2. When is this surgery scheduled? 09/18/2019  3. What type of clearance is required (medical clearance vs. Pharmacy clearance to hold med vs. Both)? both  4. Are there any medications that need to be held prior to surgery and how long? eliquis for 3 days  5. Practice name and name of physician performing surgery? Dr Nelva Bush  6. What is your office phone number [phone]    7.   What is your office fax number [phone]  8.   Anesthesia type (None, local, MAC, general) ? Not listed   Fredia Beets 09/12/2019, 5:36 PM  _________________________________________________________________   (provider comments below)

## 2019-09-13 ENCOUNTER — Other Ambulatory Visit: Payer: Self-pay

## 2019-09-13 NOTE — Telephone Encounter (Signed)
   Primary Cardiologist: Kirk Ruths, MD  Chart reviewed as part of pre-operative protocol coverage. Given past medical history and time since last visit, based on ACC/AHA guidelines, Valerie Rollins would be at acceptable risk for the planned procedure without further cardiovascular testing.  Per pharmacy, patient with diagnosis of afib on Eliquis for anticoagulation with a CHADS2-VASc score of  5 (HTN, AGE, CAD, AGE, female) and Cl 43 ml/min  Per office protocol, patient can hold Eliquis for 3 days prior to procedure.    I will route this recommendation to the requesting party via Epic fax function and remove from pre-op pool.  Please call with questions.  Kathyrn Drown, NP 09/13/2019, 1:49 PM

## 2019-09-13 NOTE — Telephone Encounter (Signed)
Patient with diagnosis of afib on Eliquis for anticoagulation.    Procedure:  Lumbar ESI Date of procedure: 09/18/2019  CHADS2-VASc score of  5 (HTN, AGE, CAD, AGE, female)  CrCl 43 ml/min  Per office protocol, patient can hold Eliquis for 3 days prior to procedure.

## 2019-09-17 ENCOUNTER — Ambulatory Visit (INDEPENDENT_AMBULATORY_CARE_PROVIDER_SITE_OTHER): Payer: Medicare HMO | Admitting: Endocrinology

## 2019-09-17 ENCOUNTER — Other Ambulatory Visit: Payer: Self-pay

## 2019-09-17 ENCOUNTER — Encounter: Payer: Self-pay | Admitting: Endocrinology

## 2019-09-17 ENCOUNTER — Telehealth: Payer: Self-pay

## 2019-09-17 VITALS — BP 124/86 | HR 75 | Ht 65.5 in | Wt 171.2 lb

## 2019-09-17 DIAGNOSIS — M8580 Other specified disorders of bone density and structure, unspecified site: Secondary | ICD-10-CM | POA: Diagnosis not present

## 2019-09-17 NOTE — Patient Instructions (Addendum)
Blood tests are requested for you today.  We'll let you know about the results.   Please continue the same vitamin-D pills.   Please come back for a follow-up appointment in 3 months

## 2019-09-17 NOTE — Telephone Encounter (Signed)
Pt is requesting lab results be mailed to home address. Routing to Dr. Loanne Drilling as a reminder

## 2019-09-17 NOTE — Progress Notes (Signed)
Subjective:    Patient ID: Valerie Rollins, female    DOB: 30-Nov-1935, 83 y.o.   MRN: [id number]  HPI Pt is referred by Dr Quay Burow, for hypercalcemia.  Pt was noted to have hypercalcemia in early 2020 (it was normal in 2019).  She stopped Ca++ supplement, and it resolved.  she has never had urolithiasis, thyroid probs, sarcoidosis, cancer, PUD, pancreatitis, or depression.  He does not take vitamin-A supplement, but she takes vit-D, 1000 unit/day.  Pt has no h/o prolonged immobilization.  Pt denies taking antacids, Li++, or HCTZ.  She has had these fractures: left ankle (2012), and right little finger (2000).  She has slight swelling of the ankles, but no assoc sob.  She has h/o osteoporosis.  Last Prolia injection was 8/20.   Past Medical History:  Diagnosis Date  . Ankle fracture, left 11/12/2011  . Atrial fibrillation ([place])    Post op after repair of ankle fracture  . 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 CAD (coronary artery disease) 03/2015   LAD 80%, otherwise minimal CAD. s/p LIMA-LAD  . Diverticulitis 2002   based on CT scan  . Diverticulosis    Dr Henrene Pastor  . DVT (deep venous thrombosis) ([place]) 2000   no trigger  . Endometriosis   . Gastritis 2006   @ Endo  . GERD (gastroesophageal reflux disease)   . Hiatal hernia   . Hyperlipidemia   . Hypertension   . IBS (irritable bowel syndrome)   . Ocular hypertension    glaucoma suspect, Dr. Kathrin Penner  . Skin cancer   . Superficial phlebitis     X 2, 1999, 2000  . Syncope 11/12/11   in context CAP . Carotid Doppler exam was negative. 2D echocardiogram revealed mild dilation of the left atrium and moderate increase in the systolic pressureof  the pulmonary artery  . UTI (lower urinary tract infection) 01/2013   Strep bovis ; PCN sensitive    Past Surgical History:  Procedure Laterality Date  . CARDIAC CATHETERIZATION N/A 03/28/2015   Procedure: Left Heart Cath and Coronary Angiography;  Surgeon:  Jolaine Artist, MD;  Location: [facility];  Service: Cardiovascular;  Laterality: N/A;  . CATARACT EXTRACTION Bilateral   . CATARACT EXTRACTION W/ INTRAOCULAR LENS IMPLANT     OS; Dr Kathrin Penner  . COLONOSCOPY  1995, 2002, 2012   diverticulosis; Dr Henrene Pastor  . CORONARY ARTERY BYPASS GRAFT N/A 04/01/2015   Procedure: CORONARY ARTERY BYPASS GRAFTING (CABG), ON PUMP, TIMES ONE, USING LEFT INTERNAL MAMMARY ARTERY;  Surgeon: Ivin Poot, MD;  Location: [facility];  Service: Open Heart Surgery;  Laterality: N/A;  . EXCISION OF ATRIAL MYXOMA N/A 04/01/2015   Procedure: EXCISION OF ATRIAL MYXOMA;  Surgeon: Ivin Poot, MD;  Location: [facility];  Service: Open Heart Surgery;  Laterality: N/A;  . ORIF ANKLE FRACTURE  11/14/2011   Procedure: OPEN REDUCTION INTERNAL FIXATION (ORIF) ANKLE FRACTURE;  Surgeon: Valerie Rhein;  [facility];  Service: Orthopedics;  Laterality: Left;  open reduction internal fixation trimalleolar ankle fracture  . TEE WITHOUT CARDIOVERSION N/A 04/01/2015   Procedure: TRANSESOPHAGEAL ECHOCARDIOGRAM (TEE);  Surgeon: Ivin Poot, MD;  Location: [facility];  Service: Open Heart Surgery;  Laterality: N/A;  . TONSILLECTOMY    . TOTAL ABDOMINAL HYSTERECTOMY W/ BILATERAL SALPINGOOPHORECTOMY  1980   dysfunctional menses, endometriosis  . [place], 2009  Social History   Socioeconomic History  . Marital status: Married    Spouse name: Not on file  . Number of children: 2  . Years of education: Not on file  . Highest education level: Not on file  Occupational History  . Occupation: Retired    Fish farm manager: RETIRED  Social Needs  . Financial resource strain: Not on file  . Food insecurity    Worry: Not on file    Inability: Not on file  . Transportation needs    Medical: Not on file    Non-medical: Not on file  Tobacco Use  . Smoking status: Never Smoker  . Smokeless tobacco: Never Used  Substance and Sexual Activity  . Alcohol  use: No  . Drug use: No  . Sexual activity: Not on file  Lifestyle  . Physical activity    Days per week: Not on file    Minutes per session: Not on file  . Stress: Not on file  Relationships  . Social Herbalist on phone: Not on file    Gets together: Not on file    Attends religious service: Not on file    Active member of club or organization: Not on file    Attends meetings of clubs or organizations: Not on file    Relationship status: Not on file  . Intimate partner violence    Fear of current or ex partner: Not on file    Emotionally abused: Not on file    Physically abused: Not on file    Forced sexual activity: Not on file  Other Topics Concern  . Not on file  Social History Narrative   REG EXERCISE   HAD COLONOSCOPY AND ENDOSCOPY DONE DATES UNKNOWN    Current Outpatient Medications on File Prior to Visit  Medication Sig Dispense Refill  . Cholecalciferol (VITAMIN D3) 1000 UNITS CAPS Take 1,000 Units by mouth daily.     . Coenzyme Q10 (CO Q 10 PO) Take by mouth daily.    . COMBIVENT RESPIMAT 20-100 MCG/ACT AERS respimat Inhale 1 puff into the lungs as needed. 4 g 5  . dicyclomine (BENTYL) 20 MG tablet TAKE 1 TABLET BY MOUTH EVERY 6 HOURS AS NEEDED FOR SPASMS 60 tablet 3  . fexofenadine (ALLEGRA) 180 MG tablet Take 1 tablet (180 mg total) by mouth daily. 30 tablet 11  . fluticasone (FLONASE) 50 MCG/ACT nasal spray Place 2 sprays into both nostrils daily. 16 g 6  . furosemide (LASIX) 40 MG tablet TAKE 1 TABLET BY MOUTH EVERY DAY 90 tablet 0  . latanoprost (XALATAN) 0.005 % ophthalmic solution Place 1 drop into both eyes daily.      Marland Kitchen losartan (COZAAR) 25 MG tablet TAKE 1 TABLET (25 MG TOTAL) BY MOUTH DAILY. 90 tablet 3  . metoprolol tartrate (LOPRESSOR) 25 MG tablet 2 tablets in the morning and 1 tablet in the evening 270 tablet 3  . montelukast (SINGULAIR) 10 MG tablet TAKE 1 TABLET AT BEDTIME 90 tablet 3  . omeprazole (PRILOSEC) 20 MG capsule TAKE 1 CAPSULE  BY MOUTH EVERY DAY 90 capsule 1  . rosuvastatin (CRESTOR) 10 MG tablet Take 1 tablet (10 mg total) by mouth daily. 90 tablet 3  . traMADol (ULTRAM) 50 MG tablet Take 1 tablet (50 mg total) by mouth every 6 (six) hours as needed. Dr.Ramos (Patient taking differently: Take 50 mg by mouth every 6 (six) hours as needed. Dr.Ramos; pt takes 1 tablet every night, maybe 1 during  the day as needed) 30 tablet 0  . albuterol (VENTOLIN HFA) 108 (90 Base) MCG/ACT inhaler Inhale 2 puffs into the lungs every 6 (six) hours as needed for wheezing or shortness of breath. (Patient not taking: Reported on 09/17/2019) 6.7 g 5  . calcium citrate-vitamin D (CITRACAL+D) 315-200 MG-UNIT per tablet Take 2 tablets by mouth 2 (two) times daily.     No current facility-administered medications on file prior to visit.     Allergies  Allergen Reactions  . Oxybutynin Chloride     Tongue swelling; Rx from Dr Hessie Diener  . Propoxyphene N-Acetaminophen     REACTION: made pt blood to thin  . Alendronate Sodium     ? reaction  . Ramipril     REACTION: cough    Family History  Problem Relation Age of Onset  . Heart attack Father 9  . Alzheimer's disease Mother 60       TIAs  . Colon cancer Maternal Grandmother   . Heart attack Paternal Grandmother 82  . Hyperlipidemia Brother   . Hypertension Brother   . Diverticulitis Daughter        S/P colectomy  . Diabetes Neg Hx   . Stroke Neg Hx   . Hypercalcemia Neg Hx     BP 124/86 (BP Location: Left Arm, Patient Position: Sitting, Cuff Size: Normal)   Pulse 75   Ht 5' 5.5" (1.664 m)   Wt 171 lb 3.2 oz (77.7 kg)   SpO2 97%   BMI 28.06 kg/m    Review of Systems Denies weight loss, visual loss, cough, diarrhea, sore throat, polyuria, hematuria, rash, depression, numbness.  She has chronic mid and low back back pain.      Objective:   Physical Exam VS: see vs page GEN: no distress HEAD: head: no deformity eyes: no periorbital swelling, no proptosis external nose  and ears are normal NECK: supple, thyroid is not enlarged CHEST WALL: no deformity.  Old healed surgical scar (median sternotomy).  No kyphosis LUNGS: clear to auscultation, except rales at the left side CV: irreg rhythm, but normal rate.  no murmur ABD: abdomen is soft, nontender.  no hepatosplenomegaly.  not distended.  no hernia MUSCULOSKELETAL: muscle bulk and strength are grossly normal.  no obvious joint swelling.  gait is normal and steady EXTEMITIES: no deformity.  1+ bilat leg edema, and bilat vv's PULSES: no carotid bruit NEURO:  cn 2-12 grossly intact.   readily moves all 4's.  sensation is intact to touch on all 4's.   SKIN:  Normal texture and temperature.  No rash or suspicious lesion is visible.   NODES:  None palpable at the neck.   PSYCH: alert, well-oriented.  Does not appear anxious nor depressed.    Lab Results  Component Value Date   CREATININE 1.03 08/17/2019   BUN 22 08/17/2019   NA 144 08/17/2019   K 4.3 08/17/2019   CL 103 08/17/2019   CO2 30 08/17/2019    Lab Results  Component Value Date   PTH 78 (H) 08/17/2019   CALCIUM 9.8 08/17/2019   CALCIUM 9.5 08/17/2019   CAION 1.20 04/02/2015   PHOS 2.5 11/12/2011   CXR (2016): left sided scarring  I have reviewed outside records, and summarized: Pt was noted to have elevated Ca++, and referred here. HTN, AF, and dyslipidemia were also addressed.      Assessment & Plan:  Hyperparathyroidism, mild.  Recheck today.  Rales, incidentally noted: due to chronic pulm scarring.  Osteoporosis,  new to me: Prolia may have resolved hypercalcemia.  Patient Instructions  Blood tests are requested for you today.  We'll let you know about the results.   Please continue the same vitamin-D pills.   Please come back for a follow-up appointment in 3 months

## 2019-09-18 DIAGNOSIS — M5136 Other intervertebral disc degeneration, lumbar region: Secondary | ICD-10-CM | POA: Diagnosis not present

## 2019-09-18 LAB — PTH, INTACT AND CALCIUM
Calcium: 9.8 mg/dL (ref 8.6–10.4)
PTH: 39 pg/mL (ref 14–64)

## 2019-09-19 ENCOUNTER — Other Ambulatory Visit: Payer: Self-pay | Admitting: Cardiology

## 2019-09-19 NOTE — Telephone Encounter (Signed)
Refill request

## 2019-09-20 LAB — VITAMIN D 25 HYDROXY (VIT D DEFICIENCY, FRACTURES): VITD: 43.37 ng/mL (ref 30.00–100.00)

## 2019-09-21 ENCOUNTER — Encounter: Payer: Self-pay | Admitting: Endocrinology

## 2019-09-24 ENCOUNTER — Other Ambulatory Visit: Payer: Self-pay

## 2019-09-24 MED ORDER — DICYCLOMINE HCL 20 MG PO TABS: ORAL_TABLET | ORAL | 3 refills | Status: DC

## 2019-10-01 DIAGNOSIS — H0100A Unspecified blepharitis right eye, upper and lower eyelids: Secondary | ICD-10-CM | POA: Diagnosis not present

## 2019-10-01 DIAGNOSIS — H401131 Primary open-angle glaucoma, bilateral, mild stage: Secondary | ICD-10-CM | POA: Diagnosis not present

## 2019-10-01 DIAGNOSIS — H0100B Unspecified blepharitis left eye, upper and lower eyelids: Secondary | ICD-10-CM | POA: Diagnosis not present

## 2019-10-01 DIAGNOSIS — H02403 Unspecified ptosis of bilateral eyelids: Secondary | ICD-10-CM | POA: Diagnosis not present

## 2019-10-02 DIAGNOSIS — M545 Low back pain: Secondary | ICD-10-CM | POA: Diagnosis not present

## 2019-10-08 ENCOUNTER — Other Ambulatory Visit: Payer: Self-pay | Admitting: Cardiology

## 2019-10-08 DIAGNOSIS — Z1231 Encounter for screening mammogram for malignant neoplasm of breast: Secondary | ICD-10-CM | POA: Diagnosis not present

## 2019-10-08 LAB — HM MAMMOGRAPHY

## 2019-10-10 ENCOUNTER — Other Ambulatory Visit: Payer: Self-pay | Admitting: Internal Medicine

## 2019-10-22 ENCOUNTER — Ambulatory Visit (INDEPENDENT_AMBULATORY_CARE_PROVIDER_SITE_OTHER): Payer: Medicare HMO | Admitting: Internal Medicine

## 2019-10-22 ENCOUNTER — Other Ambulatory Visit: Payer: Self-pay

## 2019-10-22 ENCOUNTER — Encounter: Payer: Self-pay | Admitting: Internal Medicine

## 2019-10-22 VITALS — BP 120/72 | HR 93 | Temp 98.5°F | Resp 16 | Ht 65.5 in | Wt 172.0 lb

## 2019-10-22 DIAGNOSIS — R600 Localized edema: Secondary | ICD-10-CM | POA: Diagnosis not present

## 2019-10-22 NOTE — Patient Instructions (Signed)
Take an extra lasix.      Elevate the legs and wear the compression socks  Put neosporin, vaseline on the wounds.  Monitor the legs closely.    Please call if there is no improvement in your symptoms.

## 2019-10-22 NOTE — Progress Notes (Signed)
Subjective:    Patient ID: Valerie Rollins, female    DOB: 07/24/1936, 83 y.o.   MRN: [id number]  HPI The patient is here for an acute visit.   She started having more swelling in her legs usual for a few days more than a week ago.  Her left leg is worse than the right.  She has been consistently compliant with a low-sodium diet, elevating her legs and wearing her compression hose.  Approximately 1 week ago she noticed a small wound in the anterior lower leg and 2 even smaller wounds on the medial left ankle.  She denies any known injury.  She states she could have scraped the leg when she was putting on her compression hose, but she is unsure.  There has been no bleeding or discharge in the small ones.  They do not hurt.  She is surrounding redness near these wounds and was concerned about infection.  She denies any pain pain in the lower leg.  She has not had any fevers or chills.  She denies any numbness or tingling.  She denies calf pain.    Medications and allergies reviewed with patient and updated if appropriate.  Patient Active Problem List   Diagnosis Date Noted  . Hypercalcemia 07/19/2019  . Decreased GFR 07/19/2019  . Skin yeast infection 04/20/2018  . Angular stomatitis 04/20/2018  . Seasonal allergic rhinitis due to pollen 03/21/2018  . Bilateral leg edema 12/31/2016  . Prediabetes 01/10/2016  . Osteopenia 12/26/2015  . Mass of neck 07/07/2015  . Encounter for therapeutic drug monitoring 05/14/2015  . Long-term (current) use of anticoagulants 04/16/2015  . S/P CABG x 1 04/01/2015  . CAD (coronary artery disease) 03/28/2015  . 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 Cardiomegaly 02/20/2015  . DDD (degenerative disc disease), lumbar 07/20/2012  . Atrial fibrillation ([place]) 11/15/2011  . Syncope 11/12/2011  . RBBB (right bundle branch block) 11/12/2011  . HTN (hypertension), malignant 11/12/2011  . SKIN CANCER, HX OF 11/18/2009  . Diverticulosis of large intestine 10/31/2008  .  HEART MURMUR, SYSTOLIC [id number]  . VARICOSE VEINS, LOWER EXTREMITIES 03/13/2008  . GERD 02/20/2008  . IBS 02/20/2008  . Osteoarthritis 02/20/2008  . Hyperlipidemia 12/01/2006  . Essential hypertension 12/01/2006  . SUPERFICIAL THROMBOPHLEBITIS 12/01/2006  . DVT, HX OF 12/01/2006    Current Outpatient Medications on File Prior to Visit  Medication Sig Dispense Refill  . albuterol (VENTOLIN HFA) 108 (90 Base) MCG/ACT inhaler Inhale 2 puffs into the lungs every 6 (six) hours as needed for wheezing or shortness of breath. 6.7 g 5  . calcium citrate-vitamin D (CITRACAL+D) 315-200 MG-UNIT per tablet Take 2 tablets by mouth 2 (two) times daily.    . Cholecalciferol (VITAMIN D3) 1000 UNITS CAPS Take 1,000 Units by mouth daily.     . Coenzyme Q10 (CO Q 10 PO) Take by mouth daily.    . COMBIVENT RESPIMAT 20-100 MCG/ACT AERS respimat Inhale 1 puff into the lungs as needed. 4 g 5  . dicyclomine (BENTYL) 20 MG tablet TAKE 1 TABLET BY MOUTH EVERY 6 HOURS AS NEEDED FOR SPASMS 60 tablet 3  . ELIQUIS 5 MG TABS tablet TAKE 1 TABLET BY MOUTH TWICE A DAY 60 tablet 5  . fexofenadine (ALLEGRA) 180 MG tablet Take 1 tablet (180 mg total) by mouth daily. 30 tablet 11  . fluticasone (FLONASE) 50 MCG/ACT nasal spray Place 2 sprays into both nostrils daily. 16 g 6  . furosemide (LASIX) 40 MG tablet TAKE 1  TABLET BY MOUTH EVERY DAY 90 tablet 0  . latanoprost (XALATAN) 0.005 % ophthalmic solution Place 1 drop into both eyes daily.      Marland Kitchen losartan (COZAAR) 25 MG tablet TAKE 1 TABLET (25 MG TOTAL) BY MOUTH DAILY. 90 tablet 3  . metoprolol tartrate (LOPRESSOR) 25 MG tablet 2 tablets in the morning and 1 tablet in the evening 270 tablet 3  . montelukast (SINGULAIR) 10 MG tablet TAKE 1 TABLET AT BEDTIME 90 tablet 3  . omeprazole (PRILOSEC) 20 MG capsule TAKE 1 CAPSULE BY MOUTH EVERY DAY 90 capsule 1  . traMADol (ULTRAM) 50 MG tablet Take 1 tablet (50 mg total) by mouth every 6 (six) hours as needed. Dr.Ramos (Patient  taking differently: Take 50 mg by mouth every 6 (six) hours as needed. Dr.Ramos; pt takes 1 tablet every night, maybe 1 during the day as needed) 30 tablet 0  . rosuvastatin (CRESTOR) 10 MG tablet Take 1 tablet (10 mg total) by mouth daily. 90 tablet 3   No current facility-administered medications on file prior to visit.     Past Medical History:  Diagnosis Date  . Ankle fracture, left 11/12/2011  . Atrial fibrillation ([place])    Post op after repair of ankle fracture  . 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 CAD (coronary artery disease) 03/2015   LAD 80%, otherwise minimal CAD. s/p LIMA-LAD  . Diverticulitis 2002   based on CT scan  . Diverticulosis    Dr Henrene Pastor  . DVT (deep venous thrombosis) ([place]) 2000   no trigger  . Endometriosis   . Gastritis 2006   @ Endo  . GERD (gastroesophageal reflux disease)   . Hiatal hernia   . Hyperlipidemia   . Hypertension   . IBS (irritable bowel syndrome)   . Ocular hypertension    glaucoma suspect, Dr. Kathrin Penner  . Skin cancer   . Superficial phlebitis     X 2, 1999, 2000  . Syncope 11/12/11   in context CAP . Carotid Doppler exam was negative. 2D echocardiogram revealed mild dilation of the left atrium and moderate increase in the systolic pressureof  the pulmonary artery  . UTI (lower urinary tract infection) 01/2013   Strep bovis ; PCN sensitive    Past Surgical History:  Procedure Laterality Date  . CARDIAC CATHETERIZATION N/A 03/28/2015   Procedure: Left Heart Cath and Coronary Angiography;  Surgeon: Jolaine Artist, MD;  Location: [facility];  Service: Cardiovascular;  Laterality: N/A;  . CATARACT EXTRACTION Bilateral   . CATARACT EXTRACTION W/ INTRAOCULAR LENS IMPLANT     OS; Dr Kathrin Penner  . COLONOSCOPY  1995, 2002, 2012   diverticulosis; Dr Henrene Pastor  . CORONARY ARTERY BYPASS GRAFT N/A 04/01/2015   Procedure: CORONARY ARTERY BYPASS GRAFTING (CABG), ON PUMP, TIMES ONE, USING  LEFT INTERNAL MAMMARY ARTERY;  Surgeon: Ivin Poot, MD;  Location: [facility];  Service: Open Heart Surgery;  Laterality: N/A;  . EXCISION OF ATRIAL MYXOMA N/A 04/01/2015   Procedure: EXCISION OF ATRIAL MYXOMA;  Surgeon: Ivin Poot, MD;  Location: [facility];  Service: Open Heart Surgery;  Laterality: N/A;  . ORIF ANKLE FRACTURE  11/14/2011   Procedure: OPEN REDUCTION INTERNAL FIXATION (ORIF) ANKLE FRACTURE;  Surgeon: Valerie Rhein;  [facility];  Service: Orthopedics;  Laterality: Left;  open reduction internal fixation trimalleolar ankle fracture  . TEE WITHOUT CARDIOVERSION N/A 04/01/2015   Procedure: TRANSESOPHAGEAL ECHOCARDIOGRAM (  TEE);  Surgeon: Ivin Poot, MD;  Location: [facility];  Service: Open Heart Surgery;  Laterality: N/A;  . TONSILLECTOMY    . TOTAL ABDOMINAL HYSTERECTOMY W/ BILATERAL SALPINGOOPHORECTOMY  1980   dysfunctional menses, endometriosis  . [place], 2009    Social History   Socioeconomic History  . Marital status: Married    Spouse name: Not on file  . Number of children: 2  . Years of education: Not on file  . Highest education level: Not on file  Occupational History  . Occupation: Retired    Fish farm manager: RETIRED  Social Needs  . Financial resource strain: Not on file  . Food insecurity    Worry: Not on file    Inability: Not on file  . Transportation needs    Medical: Not on file    Non-medical: Not on file  Tobacco Use  . Smoking status: Never Smoker  . Smokeless tobacco: Never Used  Substance and Sexual Activity  . Alcohol use: No  . Drug use: No  . Sexual activity: Not on file  Lifestyle  . Physical activity    Days per week: Not on file    Minutes per session: Not on file  . Stress: Not on file  Relationships  . Social Herbalist on phone: Not on file    Gets together: Not on file    Attends religious service: Not on file    Active member of club or organization: Not on file    Attends meetings  of clubs or organizations: Not on file    Relationship status: Not on file  Other Topics Concern  . Not on file  Social History Narrative   REG EXERCISE   HAD COLONOSCOPY AND ENDOSCOPY DONE DATES UNKNOWN    Family History  Problem Relation Age of Onset  . Heart attack Father 54  . Alzheimer's disease Mother 73       TIAs  . Colon cancer Maternal Grandmother   . Heart attack Paternal Grandmother 82  . Hyperlipidemia Brother   . Hypertension Brother   . Diverticulitis Daughter        S/P colectomy  . Diabetes Neg Hx   . Stroke Neg Hx   . Hypercalcemia Neg Hx     Review of Systems  Constitutional: Negative for chills and fever.  Respiratory: Negative for shortness of breath.   Cardiovascular: Positive for leg swelling. Negative for chest pain and palpitations.  Skin: Positive for color change and wound.  Neurological: Negative for numbness.       Objective:   Vitals:   10/22/19 1029  BP: 120/72  Pulse: 93  Resp: 16  Temp: 98.5 F (36.9 C)  SpO2: 99%   BP Readings from Last 3 Encounters:  10/22/19 120/72  09/17/19 124/86  07/16/19 134/84   Wt Readings from Last 3 Encounters:  10/22/19 172 lb (78 kg)  09/17/19 171 lb 3.2 oz (77.7 kg)  07/16/19 169 lb (76.7 kg)   Body mass index is 28.19 kg/m.   Physical Exam Constitutional:      General: She is not in acute distress.    Appearance: Normal appearance. She is not ill-appearing.  Cardiovascular:     Comments: Significant varicose veins b/l LE Pulmonary:     Effort: Pulmonary effort is normal.  Musculoskeletal:     Right lower leg: Edema (1 + non pitting edema) present.     Left lower leg:  Edema (Left lower extremity slightly more swollen than the right lower extremity 1+ nonpitting edema) present.  Skin:    General: Skin is warm and dry.     Findings: Erythema (patchy erythema LLE - anterior distal lower leg and medial ankle near wounds - nontender, not warm to touch) present.     Comments: Small ulcer  like lesion with scab the size of a pumpkin seed on anterior left lower leg, two much smaller ulcers/ areas where top layer of skin is gone on medial ankle, no discharge or bleeding, nontender  Neurological:     Mental Status: She is alert.     Sensory: No sensory deficit.            Assessment & Plan:    See Problem List for Assessment and Plan of chronic medical problems.    This visit occurred during the SARS-CoV-2 public health emergency.  Safety protocols were in place, including screening questions prior to the visit, additional usage of staff PPE, and extensive cleaning of exam room while observing appropriate contact time as indicated for disinfecting solutions.

## 2019-10-22 NOTE — Assessment & Plan Note (Signed)
With three small ulcers - likely related to venous insufficiency and edema No evidence of infection - erythema is likely related to swelling - there is no tenderness or warmth Take an extra lasix today and then return to once daily monitor leg closely - monitor for worsening of erythema or pain Apply neosporin or vaseline to wounds Use compression socks, elevated legs, low sodium diet

## 2019-11-02 ENCOUNTER — Ambulatory Visit: Payer: Medicare HMO | Admitting: Internal Medicine

## 2019-11-02 ENCOUNTER — Encounter: Payer: Self-pay | Admitting: Internal Medicine

## 2019-11-02 VITALS — BP 102/72 | HR 90 | Temp 97.8°F | Ht 63.0 in | Wt 172.0 lb

## 2019-11-02 DIAGNOSIS — R109 Unspecified abdominal pain: Secondary | ICD-10-CM

## 2019-11-02 DIAGNOSIS — K219 Gastro-esophageal reflux disease without esophagitis: Secondary | ICD-10-CM | POA: Diagnosis not present

## 2019-11-02 DIAGNOSIS — K589 Irritable bowel syndrome without diarrhea: Secondary | ICD-10-CM

## 2019-11-02 MED ORDER — DICYCLOMINE HCL 20 MG PO TABS: ORAL_TABLET | ORAL | 11 refills | Status: DC

## 2019-11-02 NOTE — Progress Notes (Signed)
HISTORY OF PRESENT ILLNESS:  Valerie Rollins is a 83 y.o. female with GERD and IBS who presents today with her daughter for routine follow-up.  The patient was last evaluated in this office December 2019.  See that dictation.  At that time she was doing well on omeprazole with good control of GERD.  She was experiencing constipation at the time which was felt to be secondary to medications.  As well she was having some abdominal cramping for which Bentyl was prescribed.  She tells me that she continues to do well on omeprazole with good control of GERD.  No dysphagia.  She has managed her bowel movements with on-demand agents like MiraLAX and high-fiber diet.  She does continue to have intermittent, stable, abdominal cramping for which Bentyl remains helpful.  She does require a refill of her prescription.  Her last colonoscopy in 2012 revealed severe diverticulosis with sigmoid stenosis but was otherwise normal.  REVIEW OF SYSTEMS:  All non-GI ROS negative unless otherwise stated in the HPI except for arthritis, back pain, anxiety, cough, heart rhythm change, ankle swelling, voice change  Past Medical History:  Diagnosis Date  . Ankle fracture, left 11/12/2011  . Atrial fibrillation ([place])    Post op after repair of ankle fracture  . 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 CAD (coronary artery disease) 03/2015   LAD 80%, otherwise minimal CAD. s/p LIMA-LAD  . Diverticulitis 2002   based on CT scan  . Diverticulosis    Dr Henrene Pastor  . DVT (deep venous thrombosis) ([place]) 2000   no trigger  . Endometriosis   . Gastritis 2006   @ Endo  . GERD (gastroesophageal reflux disease)   . Hiatal hernia   . Hyperlipidemia   . Hypertension   . IBS (irritable bowel syndrome)   . Ocular hypertension    glaucoma suspect, Dr. Kathrin Penner  . Skin cancer   . Superficial phlebitis     X 2, 1999, 2000  . Syncope 11/12/11   in context CAP . Carotid Doppler exam was negative. 2D  echocardiogram revealed mild dilation of the left atrium and moderate increase in the systolic pressureof  the pulmonary artery  . UTI (lower urinary tract infection) 01/2013   Strep bovis ; PCN sensitive    Past Surgical History:  Procedure Laterality Date  . CARDIAC CATHETERIZATION N/A 03/28/2015   Procedure: Left Heart Cath and Coronary Angiography;  Surgeon: Jolaine Artist, MD;  Location: [facility];  Service: Cardiovascular;  Laterality: N/A;  . CATARACT EXTRACTION Bilateral   . CATARACT EXTRACTION W/ INTRAOCULAR LENS IMPLANT     OS; Dr Kathrin Penner  . COLONOSCOPY  1995, 2002, 2012   diverticulosis; Dr Henrene Pastor  . CORONARY ARTERY BYPASS GRAFT N/A 04/01/2015   Procedure: CORONARY ARTERY BYPASS GRAFTING (CABG), ON PUMP, TIMES ONE, USING LEFT INTERNAL MAMMARY ARTERY;  Surgeon: Ivin Poot, MD;  Location: [facility];  Service: Open Heart Surgery;  Laterality: N/A;  . EXCISION OF ATRIAL MYXOMA N/A 04/01/2015   Procedure: EXCISION OF ATRIAL MYXOMA;  Surgeon: Ivin Poot, MD;  Location: [facility];  Service: Open Heart Surgery;  Laterality: N/A;  . ORIF ANKLE FRACTURE  11/14/2011   Procedure: OPEN REDUCTION INTERNAL FIXATION (ORIF) ANKLE FRACTURE;  Surgeon: Colin Rhein;  Location: [facility];  Service: Orthopedics;  Laterality: Left;  open reduction internal fixation trimalleolar ankle fracture  . TEE WITHOUT CARDIOVERSION N/A 04/01/2015  Procedure: TRANSESOPHAGEAL ECHOCARDIOGRAM (TEE);  Surgeon: Ivin Poot, MD;  Location: [facility];  Service: Open Heart Surgery;  Laterality: N/A;  . TONSILLECTOMY    . TOTAL ABDOMINAL HYSTERECTOMY W/ BILATERAL SALPINGOOPHORECTOMY  1980   dysfunctional menses, endometriosis  . [name], 2009    Social History Valerie Rollins  reports that she has never smoked. She has never used smokeless tobacco. She reports that she does not drink alcohol or use drugs.  family history includes Alzheimer's disease (age of onset: 75) in  her mother; Colon cancer in her maternal grandmother; Diverticulitis in her daughter; Heart attack (age of onset: 46) in her father; Heart attack (age of onset: 64) in her paternal grandmother; Hyperlipidemia in her brother; Hypertension in her brother.  Allergies  Allergen Reactions  . Oxybutynin Chloride     Tongue swelling; Rx from Dr Hessie Diener  . Propoxyphene N-Acetaminophen     REACTION: made pt blood to thin  . Alendronate Sodium     ? reaction  . Ramipril     REACTION: cough       PHYSICAL EXAMINATION: Vital signs: BP 102/72   Pulse 90   Temp 97.8 F (36.6 C)   Ht 5\' 3"  (1.6 m) Comment: w/ o shoes  Wt 172 lb (78 kg)   BMI 30.47 kg/m   Constitutional: generally well-appearing, no acute distress Psychiatric: alert and oriented x3, cooperative Eyes: extraocular movements intact, anicteric, conjunctiva pink Mouth: Mask Neck: supple no lymphadenopathy Cardiovascular: Irregularly irregular, no murmur Lungs: clear to auscultation bilaterally Abdomen: soft, nontender, nondistended, no obvious ascites, no peritoneal signs, normal bowel sounds, no organomegaly Rectal: Omitted Extremities: no clubbing, cyanosis, or lower extremity edema bilaterally Skin: no lesions on visible extremities Neuro: No focal deficits.  Cranial nerves intact   ASSESSMENT:  1.  GERD.  Symptoms controlled on PPI 2.  Irritable bowel syndrome.  Recent tendencies toward constipation 3.  Diverticulosis   PLAN:  1.  Reflux precautions 2.  Continue omeprazole 20 mg daily 3.  Continue high-fiber diet and on-demand MiraLAX 4.  Refill Bentyl.  20 mg by mouth every 6 hours as needed for spasms.  Medication effects and risks reviewed 5.  Routine GI follow-up 1 year

## 2019-11-02 NOTE — Patient Instructions (Signed)
We have sent the following medications to your pharmacy for you to pick up at your convenience: dicyclomine.

## 2019-11-05 DIAGNOSIS — Z79899 Other long term (current) drug therapy: Secondary | ICD-10-CM | POA: Insufficient documentation

## 2019-11-05 DIAGNOSIS — M5136 Other intervertebral disc degeneration, lumbar region: Secondary | ICD-10-CM | POA: Diagnosis not present

## 2019-11-05 DIAGNOSIS — M545 Low back pain: Secondary | ICD-10-CM | POA: Diagnosis not present

## 2019-11-06 DIAGNOSIS — L570 Actinic keratosis: Secondary | ICD-10-CM | POA: Diagnosis not present

## 2019-11-06 DIAGNOSIS — X32XXXD Exposure to sunlight, subsequent encounter: Secondary | ICD-10-CM | POA: Diagnosis not present

## 2019-11-06 DIAGNOSIS — Z1283 Encounter for screening for malignant neoplasm of skin: Secondary | ICD-10-CM | POA: Diagnosis not present

## 2019-11-06 DIAGNOSIS — D225 Melanocytic nevi of trunk: Secondary | ICD-10-CM | POA: Diagnosis not present

## 2019-11-06 DIAGNOSIS — I872 Venous insufficiency (chronic) (peripheral): Secondary | ICD-10-CM | POA: Diagnosis not present

## 2019-12-02 ENCOUNTER — Encounter: Payer: Self-pay | Admitting: Internal Medicine

## 2019-12-14 ENCOUNTER — Ambulatory Visit: Payer: Medicare HMO | Attending: Internal Medicine

## 2019-12-14 DIAGNOSIS — Z23 Encounter for immunization: Secondary | ICD-10-CM | POA: Insufficient documentation

## 2019-12-14 DIAGNOSIS — Z79899 Other long term (current) drug therapy: Secondary | ICD-10-CM | POA: Diagnosis not present

## 2019-12-14 DIAGNOSIS — Z5181 Encounter for therapeutic drug level monitoring: Secondary | ICD-10-CM | POA: Diagnosis not present

## 2019-12-14 NOTE — Progress Notes (Signed)
   Covid-19 Vaccination Clinic  Name:  Valerie Rollins    MRN: [id number] DOB: [birth year]  12/14/2019  Ms. Distel was observed post Covid-19 immunization for 30 minutes based on pre-vaccination screening without incidence. She was provided with Vaccine Information Sheet and instruction to access the V-Safe system.   Ms. Cure was instructed to call 911 with any severe reactions post vaccine: Marland Kitchen Difficulty breathing  . Swelling of your face and throat  . A fast heartbeat  . A bad rash all over your body  . Dizziness and weakness    Immunizations Administered    Name Date Dose VIS Date Route   Pfizer COVID-19 Vaccine 12/14/2019  9:26 AM 0.3 mL 11/02/2019 Intramuscular   Manufacturer: [place]   Lot: [id number]   [place]: [id number]

## 2019-12-20 ENCOUNTER — Other Ambulatory Visit: Payer: Self-pay

## 2019-12-22 ENCOUNTER — Other Ambulatory Visit: Payer: Self-pay | Admitting: Internal Medicine

## 2019-12-24 ENCOUNTER — Encounter: Payer: Self-pay | Admitting: Endocrinology

## 2019-12-24 ENCOUNTER — Ambulatory Visit (INDEPENDENT_AMBULATORY_CARE_PROVIDER_SITE_OTHER): Payer: Medicare HMO | Admitting: Endocrinology

## 2019-12-24 ENCOUNTER — Other Ambulatory Visit: Payer: Self-pay

## 2019-12-24 NOTE — Progress Notes (Signed)
Subjective:    Patient ID: Valerie Rollins, female    DOB: [birth year], 84 y.o.   MRN: [id number]  HPI Pt returns for f/u of hypercalcemia (noted 2020; she stopped Ca++ supplement, and it resolved; she has never had urolithiasis; she takes vit-D, 1000 unit/day; she has had these fractures: left ankle (2012), and right little finger (2000); she has h/o osteoporosis; last Prolia injection was 8/20; vit-d supplement may have normalized PTH.  Ca++ tabs, normalization of PTH, or Prolia may have normalized Ca++).  She does not take Ca++ tabs.  She takes vit-D, 1000 units/day.   Past Medical History:  Diagnosis Date  . Ankle fracture, left 11/12/2011  . Atrial fibrillation ([place])    Post op after repair of ankle fracture  . 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 CAD (coronary artery disease) 03/2015   LAD 80%, otherwise minimal CAD. s/p LIMA-LAD  . Diverticulitis 2002   based on CT scan  . Diverticulosis    Dr Henrene Pastor  . DVT (deep venous thrombosis) ([place]) 2000   no trigger  . Endometriosis   . Gastritis 2006   @ Endo  . GERD (gastroesophageal reflux disease)   . Hiatal hernia   . Hyperlipidemia   . Hypertension   . IBS (irritable bowel syndrome)   . Ocular hypertension    glaucoma suspect, Dr. Kathrin Penner  . Skin cancer   . Superficial phlebitis     X 2, 1999, 2000  . Syncope 11/12/11   in context CAP . Carotid Doppler exam was negative. 2D echocardiogram revealed mild dilation of the left atrium and moderate increase in the systolic pressureof  the pulmonary artery  . UTI (lower urinary tract infection) 01/2013   Strep bovis ; PCN sensitive    Past Surgical History:  Procedure Laterality Date  . CARDIAC CATHETERIZATION N/A 03/28/2015   Procedure: Left Heart Cath and Coronary Angiography;  Surgeon: Jolaine Artist, MD;  Location: [facility];  Service: Cardiovascular;  Laterality: N/A;  . CATARACT EXTRACTION Bilateral   . CATARACT  EXTRACTION W/ INTRAOCULAR LENS IMPLANT     OS; Dr Kathrin Penner  . COLONOSCOPY  1995, 2002, 2012   diverticulosis; Dr Henrene Pastor  . CORONARY ARTERY BYPASS GRAFT N/A 04/01/2015   Procedure: CORONARY ARTERY BYPASS GRAFTING (CABG), ON PUMP, TIMES ONE, USING LEFT INTERNAL MAMMARY ARTERY;  Surgeon: Ivin Poot, MD;  Location: [facility];  Service: Open Heart Surgery;  Laterality: N/A;  . EXCISION OF ATRIAL MYXOMA N/A 04/01/2015   Procedure: EXCISION OF ATRIAL MYXOMA;  Surgeon: Ivin Poot, MD;  Location: [facility];  Service: Open Heart Surgery;  Laterality: N/A;  . ORIF ANKLE FRACTURE  11/14/2011   Procedure: OPEN REDUCTION INTERNAL FIXATION (ORIF) ANKLE FRACTURE;  Surgeon: Valerie Rhein;  [facility];  Service: Orthopedics;  Laterality: Left;  open reduction internal fixation trimalleolar ankle fracture  . TEE WITHOUT CARDIOVERSION N/A 04/01/2015   Procedure: TRANSESOPHAGEAL ECHOCARDIOGRAM (TEE);  Surgeon: Ivin Poot, MD;  Location: [facility];  Service: Open Heart Surgery;  Laterality: N/A;  . TONSILLECTOMY    . TOTAL ABDOMINAL HYSTERECTOMY W/ BILATERAL SALPINGOOPHORECTOMY  1980   dysfunctional menses, endometriosis  . [place], 2009    Social History   Socioeconomic History  . Marital status: Married    Spouse name: Not on file  . Number of children: 2  . Years of education:  Not on file  . Highest education level: Not on file  Occupational History  . Occupation: Retired    Fish farm manager: RETIRED  Tobacco Use  . Smoking status: Never Smoker  . Smokeless tobacco: Never Used  Substance and Sexual Activity  . Alcohol use: No  . Drug use: No  . Sexual activity: Not on file  Other Topics Concern  . Not on file  Social History Narrative   REG EXERCISE   HAD COLONOSCOPY AND ENDOSCOPY DONE DATES UNKNOWN   Social Determinants of Health   Financial Resource Strain:   . Difficulty of Paying Living Expenses: Not on file  Food Insecurity:   . Worried About Paediatric nurse in the Last Year: Not on file  . Ran Out of Food in the Last Year: Not on file  Transportation Needs:   . Lack of Transportation (Medical): Not on file  . Lack of Transportation (Non-Medical): Not on file  Physical Activity:   . Days of Exercise per Week: Not on file  . Minutes of Exercise per Session: Not on file  Stress:   . Feeling of Stress : Not on file  Social Connections:   . Frequency of Communication with Friends and Family: Not on file  . Frequency of Social Gatherings with Friends and Family: Not on file  . Attends Religious Services: Not on file  . Active Member of Clubs or Organizations: Not on file  . Attends Archivist Meetings: Not on file  . Marital Status: Not on file  Intimate Partner Violence:   . Fear of Current or Ex-Partner: Not on file  . Emotionally Abused: Not on file  . Physically Abused: Not on file  . Sexually Abused: Not on file    Current Outpatient Medications on File Prior to Visit  Medication Sig Dispense Refill  . albuterol (VENTOLIN HFA) 108 (90 Base) MCG/ACT inhaler Inhale 2 puffs into the lungs every 6 (six) hours as needed for wheezing or shortness of breath. 6.7 g 5  . calcium citrate-vitamin D (CITRACAL+D) 315-200 MG-UNIT per tablet Take 2 tablets by mouth 2 (two) times daily.    . Cholecalciferol (VITAMIN D3) 1000 UNITS CAPS Take 1,000 Units by mouth daily.     . Coenzyme Q10 (CO Q 10 PO) Take by mouth daily.    . COMBIVENT RESPIMAT 20-100 MCG/ACT AERS respimat Inhale 1 puff into the lungs as needed. 4 g 5  . dicyclomine (BENTYL) 20 MG tablet TAKE 1 TABLET BY MOUTH EVERY 6 HOURS AS NEEDED FOR SPASMS 60 tablet 11  . ELIQUIS 5 MG TABS tablet TAKE 1 TABLET BY MOUTH TWICE A DAY 60 tablet 5  . fexofenadine (ALLEGRA) 180 MG tablet Take 1 tablet (180 mg total) by mouth daily. 30 tablet 11  . fluticasone (FLONASE) 50 MCG/ACT nasal spray Place 2 sprays into both nostrils daily. 16 g 6  . furosemide (LASIX) 40 MG tablet TAKE 1  TABLET BY MOUTH EVERY DAY 90 tablet 0  . latanoprost (XALATAN) 0.005 % ophthalmic solution Place 1 drop into both eyes daily.      Marland Kitchen losartan (COZAAR) 25 MG tablet TAKE 1 TABLET (25 MG TOTAL) BY MOUTH DAILY. 90 tablet 3  . metoprolol tartrate (LOPRESSOR) 25 MG tablet 2 tablets in the morning and 1 tablet in the evening 270 tablet 3  . montelukast (SINGULAIR) 10 MG tablet TAKE 1 TABLET AT BEDTIME 90 tablet 3  . omeprazole (PRILOSEC) 20 MG capsule TAKE 1 CAPSULE BY MOUTH  EVERY DAY 90 capsule 1  . traMADol (ULTRAM) 50 MG tablet Take 1 tablet (50 mg total) by mouth every 6 (six) hours as needed. Dr.Ramos (Patient taking differently: Take 50 mg by mouth every 6 (six) hours as needed. Dr.Ramos; pt takes 1 tablet every night, maybe 1 during the day as needed) 30 tablet 0  . rosuvastatin (CRESTOR) 10 MG tablet Take 1 tablet (10 mg total) by mouth daily. 90 tablet 3   No current facility-administered medications on file prior to visit.    Allergies  Allergen Reactions  . Oxybutynin Chloride     Tongue swelling; Rx from Dr Hessie Diener  . Propoxyphene N-Acetaminophen     REACTION: made pt blood to thin  . Alendronate Sodium     ? reaction  . Ramipril     REACTION: cough    Family History  Problem Relation Age of Onset  . Heart attack Father 20  . Alzheimer's disease Mother 67       TIAs  . Colon cancer Maternal Grandmother   . Heart attack Paternal Grandmother 82  . Hyperlipidemia Brother   . Hypertension Brother   . Diverticulitis Daughter        S/P colectomy  . Diabetes Neg Hx   . Stroke Neg Hx   . Hypercalcemia Neg Hx   . Stomach cancer Neg Hx   . Pancreatic cancer Neg Hx   . Esophageal cancer Neg Hx     BP 102/64 (BP Location: Left Arm, Patient Position: Sitting, Cuff Size: Normal)   Pulse (!) 113   Ht 5\' 3"  (1.6 m)   Wt 168 lb 6.4 oz (76.4 kg)   SpO2 98%   BMI 29.83 kg/m   Review of Systems Denies falls.      Objective:   Physical Exam VITAL SIGNS:  See vs page.     GENERAL: no distress.   GAIT: normal and steady.     Lab Results  Component Value Date   TSH 3.07 07/16/2019   Lab Results  Component Value Date   PTH 39 09/17/2019   CALCIUM 9.8 09/17/2019   CAION 1.20 04/02/2015   PHOS 2.5 11/12/2011       Assessment & Plan:  elev PTH, improved, poss due to vit-d supplementation. Hypercalcemia: recheck today Vit-D def: recheck today  Patient Instructions  Blood tests are requested for you today.  We'll let you know about the results.   Please continue the same Prolia.   If the results of the blood are good, please come back for a follow-up appointment in 6 months.

## 2019-12-24 NOTE — Patient Instructions (Addendum)
Blood tests are requested for you today.  We'll let you know about the results.   Please continue the same Prolia.   If the results of the blood are good, please come back for a follow-up appointment in 6 months.

## 2019-12-25 LAB — VITAMIN D 25 HYDROXY (VIT D DEFICIENCY, FRACTURES): VITD: 37.59 ng/mL (ref 30.00–100.00)

## 2019-12-25 LAB — PTH, INTACT AND CALCIUM
Calcium: 9.6 mg/dL (ref 8.6–10.4)
PTH: 53 pg/mL (ref 14–64)

## 2019-12-26 ENCOUNTER — Telehealth: Payer: Self-pay

## 2019-12-26 NOTE — Telephone Encounter (Signed)
Lab results reviewed by Dr. Ellison. Letter has been mailed. For future reference, letter can be found in Epic. 

## 2019-12-26 NOTE — Telephone Encounter (Signed)
-----   Message from Renato Shin, MD sent at 12/25/2019  6:23 PM EST ----- please contact patient: Normal results--good.  Please come back for a follow-up appointment in 6 months.

## 2020-01-01 ENCOUNTER — Other Ambulatory Visit: Payer: Self-pay

## 2020-01-01 MED ORDER — FUROSEMIDE 40 MG PO TABS: 40.0000 mg | ORAL_TABLET | Freq: Every day | ORAL | 0 refills | Status: DC

## 2020-01-03 ENCOUNTER — Ambulatory Visit: Payer: Medicare HMO | Attending: Internal Medicine

## 2020-01-03 DIAGNOSIS — Z23 Encounter for immunization: Secondary | ICD-10-CM | POA: Insufficient documentation

## 2020-01-03 NOTE — Progress Notes (Signed)
   Covid-19 Vaccination Clinic  Name:  Valerie Rollins    MRN: [id number] DOB: [birth year]  01/03/2020  [name] was observed post Covid-19 immunization for 15 minutes without incidence. She was provided with Vaccine Information Sheet and instruction to access the V-Safe system.   [name] was instructed to call 911 with any severe reactions post vaccine: Marland Kitchen Difficulty breathing  . Swelling of your face and throat  . A fast heartbeat  . A bad rash all over your body  . Dizziness and weakness    Immunizations Administered    Name Date Dose VIS Date Route   Pfizer COVID-19 Vaccine 01/03/2020  1:42 PM 0.3 mL 11/02/2019 Intramuscular   Manufacturer: [place]   Lot: [id number]   [place]: [id number]

## 2020-01-20 NOTE — Progress Notes (Signed)
Subjective:    Patient ID: Valerie Rollins, female    DOB: [birth year], 84 y.o.   MRN: [id number]  HPI She is here for a physical exam.   She denies major changes in her health  Medications and allergies reviewed with patient and updated if appropriate.  Patient Active Problem List   Diagnosis Date Noted  . Hypercalcemia 07/19/2019  . CKD (chronic kidney disease) stage 3, GFR 30-59 ml/min 07/19/2019  . Skin yeast infection 04/20/2018  . Angular stomatitis 04/20/2018  . Seasonal allergic rhinitis due to pollen 03/21/2018  . Bilateral leg edema 12/31/2016  . Prediabetes 01/10/2016  . Osteopenia 12/26/2015  . Mass of neck 07/07/2015  . Long-term (current) use of anticoagulants 04/16/2015  . S/P CABG x 1 04/01/2015  . CAD (coronary artery disease) 03/28/2015  . 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 Cardiomegaly 02/20/2015  . DDD (degenerative disc disease), lumbar 07/20/2012  . Atrial fibrillation ([place]) 11/15/2011  . Syncope 11/12/2011  . RBBB (right bundle branch block) 11/12/2011  . HTN (hypertension), malignant 11/12/2011  . SKIN CANCER, HX OF 11/18/2009  . Diverticulosis of large intestine 10/31/2008  . HEART MURMUR, SYSTOLIC [id number]  . VARICOSE VEINS, LOWER EXTREMITIES 03/13/2008  . GERD 02/20/2008  . IBS 02/20/2008  . Osteoarthritis 02/20/2008  . Hyperlipidemia 12/01/2006  . Essential hypertension 12/01/2006  . SUPERFICIAL THROMBOPHLEBITIS 12/01/2006  . DVT, HX OF 12/01/2006    Current Outpatient Medications on File Prior to Visit  Medication Sig Dispense Refill  . albuterol (VENTOLIN HFA) 108 (90 Base) MCG/ACT inhaler Inhale 2 puffs into the lungs every 6 (six) hours as needed for wheezing or shortness of breath. 6.7 g 5  . Cholecalciferol (VITAMIN D3) 1000 UNITS CAPS Take 1,000 Units by mouth daily.     . Coenzyme Q10 (CO Q 10 PO) Take by mouth daily.    Marland Kitchen dicyclomine (BENTYL) 20 MG tablet TAKE 1 TABLET BY MOUTH EVERY 6 HOURS AS NEEDED FOR SPASMS 60 tablet 11  .  ELIQUIS 5 MG TABS tablet TAKE 1 TABLET BY MOUTH TWICE A DAY 60 tablet 5  . fexofenadine (ALLEGRA) 180 MG tablet Take 1 tablet (180 mg total) by mouth daily. 30 tablet 11  . fluticasone (FLONASE) 50 MCG/ACT nasal spray Place 2 sprays into both nostrils daily. 16 g 6  . furosemide (LASIX) 40 MG tablet Take 1 tablet (40 mg total) by mouth daily. 90 tablet 0  . latanoprost (XALATAN) 0.005 % ophthalmic solution Place 1 drop into both eyes daily.      Marland Kitchen losartan (COZAAR) 25 MG tablet TAKE 1 TABLET (25 MG TOTAL) BY MOUTH DAILY. 90 tablet 3  . metoprolol tartrate (LOPRESSOR) 25 MG tablet 2 tablets in the morning and 1 tablet in the evening 270 tablet 3  . montelukast (SINGULAIR) 10 MG tablet TAKE 1 TABLET AT BEDTIME 90 tablet 3  . omeprazole (PRILOSEC) 20 MG capsule TAKE 1 CAPSULE BY MOUTH EVERY DAY 90 capsule 1  . traMADol (ULTRAM) 50 MG tablet Take 1 tablet (50 mg total) by mouth every 6 (six) hours as needed. Dr.Ramos (Patient taking differently: Take 50 mg by mouth every 6 (six) hours as needed. Dr.Ramos; pt takes 1 tablet every night, maybe 1 during the day as needed) 30 tablet 0  . rosuvastatin (CRESTOR) 10 MG tablet Take 1 tablet (10 mg total) by mouth daily. 90 tablet 3   No current facility-administered medications on file prior to visit.    Past Medical History:  Diagnosis Date  .  Ankle fracture, left 11/12/2011  . Atrial fibrillation ([place])    Post op after repair of ankle fracture  . 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 CAD (coronary artery disease) 03/2015   LAD 80%, otherwise minimal CAD. s/p LIMA-LAD  . Diverticulitis 2002   based on CT scan  . Diverticulosis    Dr Henrene Pastor  . DVT (deep venous thrombosis) ([place]) 2000   no trigger  . Endometriosis   . Gastritis 2006   @ Endo  . GERD (gastroesophageal reflux disease)   . Hiatal hernia   . Hyperlipidemia   . Hypertension   . IBS (irritable bowel syndrome)   . Ocular hypertension    glaucoma  suspect, Dr. Kathrin Penner  . Skin cancer   . Superficial phlebitis     X 2, 1999, 2000  . Syncope 11/12/11   in context CAP . Carotid Doppler exam was negative. 2D echocardiogram revealed mild dilation of the left atrium and moderate increase in the systolic pressureof  the pulmonary artery  . UTI (lower urinary tract infection) 01/2013   Strep bovis ; PCN sensitive    Past Surgical History:  Procedure Laterality Date  . CARDIAC CATHETERIZATION N/A 03/28/2015   Procedure: Left Heart Cath and Coronary Angiography;  Surgeon: Jolaine Artist, MD;  Location: [facility];  Service: Cardiovascular;  Laterality: N/A;  . CATARACT EXTRACTION Bilateral   . CATARACT EXTRACTION W/ INTRAOCULAR LENS IMPLANT     OS; Dr Kathrin Penner  . COLONOSCOPY  1995, 2002, 2012   diverticulosis; Dr Henrene Pastor  . CORONARY ARTERY BYPASS GRAFT N/A 04/01/2015   Procedure: CORONARY ARTERY BYPASS GRAFTING (CABG), ON PUMP, TIMES ONE, USING LEFT INTERNAL MAMMARY ARTERY;  Surgeon: Ivin Poot, MD;  Location: [place];  Service: Open Heart Surgery;  Laterality: N/A;  . EXCISION OF ATRIAL MYXOMA N/A 04/01/2015   Procedure: EXCISION OF ATRIAL MYXOMA;  Surgeon: Ivin Poot, MD;  Location: [facility];  Service: Open Heart Surgery;  Laterality: N/A;  . ORIF ANKLE FRACTURE  11/14/2011   Procedure: OPEN REDUCTION INTERNAL FIXATION (ORIF) ANKLE FRACTURE;  Surgeon: Valerie Rhein;  Location: [facility];  Service: Orthopedics;  Laterality: Left;  open reduction internal fixation trimalleolar ankle fracture  . TEE WITHOUT CARDIOVERSION N/A 04/01/2015   Procedure: TRANSESOPHAGEAL ECHOCARDIOGRAM (TEE);  Surgeon: Ivin Poot, MD;  Location: [facility];  Service: Open Heart Surgery;  Laterality: N/A;  . TONSILLECTOMY    . TOTAL ABDOMINAL HYSTERECTOMY W/ BILATERAL SALPINGOOPHORECTOMY  1980   dysfunctional menses, endometriosis  . [place], 2009    Social History   Socioeconomic History  . Marital status:  Married    Spouse name: Not on file  . Number of children: 2  . Years of education: Not on file  . Highest education level: Not on file  Occupational History  . Occupation: Retired    Fish farm manager: RETIRED  Tobacco Use  . Smoking status: Never Smoker  . Smokeless tobacco: Never Used  Substance and Sexual Activity  . Alcohol use: No  . Drug use: No  . Sexual activity: Not on file  Other Topics Concern  . Not on file  Social History Narrative   REG EXERCISE   HAD COLONOSCOPY AND ENDOSCOPY DONE DATES UNKNOWN   Social Determinants of Health   Financial Resource Strain:   . Difficulty of Paying Living Expenses: Not on file  Food Insecurity:   .  Worried About Charity fundraiser in the Last Year: Not on file  . Ran Out of Food in the Last Year: Not on file  Transportation Needs:   . Lack of Transportation (Medical): Not on file  . Lack of Transportation (Non-Medical): Not on file  Physical Activity:   . Days of Exercise per Week: Not on file  . Minutes of Exercise per Session: Not on file  Stress:   . Feeling of Stress : Not on file  Social Connections:   . Frequency of Communication with Friends and Family: Not on file  . Frequency of Social Gatherings with Friends and Family: Not on file  . Attends Religious Services: Not on file  . Active Member of Clubs or Organizations: Not on file  . Attends Archivist Meetings: Not on file  . Marital Status: Not on file    Family History  Problem Relation Age of Onset  . Heart attack Father 53  . Alzheimer's disease Mother 61       TIAs  . Colon cancer Maternal Grandmother   . Heart attack Paternal Grandmother 82  . Hyperlipidemia Brother   . Hypertension Brother   . Diverticulitis Daughter        S/P colectomy  . Diabetes Neg Hx   . Stroke Neg Hx   . Hypercalcemia Neg Hx   . Stomach cancer Neg Hx   . Pancreatic cancer Neg Hx   . Esophageal cancer Neg Hx     Review of Systems  Constitutional: Negative for chills  and fever.  Eyes: Negative for visual disturbance.  Respiratory: Positive for cough (occ) and shortness of breath (with moderate exertion). Negative for wheezing.   Cardiovascular: Positive for leg swelling. Negative for chest pain and palpitations.  Gastrointestinal: Negative for abdominal pain, blood in stool, constipation, diarrhea and nausea.       GERD controlled  Genitourinary: Negative for dysuria and hematuria.  Musculoskeletal: Positive for back pain. Negative for arthralgias.  Skin: Negative for rash.  Neurological: Negative for light-headedness and headaches.  Psychiatric/Behavioral: Negative for dysphoric mood. The patient is not nervous/anxious.        Objective:   Vitals:   01/21/20 1447  BP: 140/82  Pulse: 87  Resp: 16  Temp: 98.8 F (37.1 C)  SpO2: 95%   Filed Weights   01/21/20 1447  Weight: 164 lb (74.4 kg)   Body mass index is 29.05 kg/m.  BP Readings from Last 3 Encounters:  01/21/20 140/82  12/24/19 102/64  11/02/19 102/72    Wt Readings from Last 3 Encounters:  01/21/20 164 lb (74.4 kg)  12/24/19 168 lb 6.4 oz (76.4 kg)  11/02/19 172 lb (78 kg)     Physical Exam Constitutional: She appears well-developed and well-nourished. No distress.  HENT:  Head: Normocephalic and atraumatic.  Right Ear: External ear normal. Normal ear canal and TM Left Ear: External ear normal.  Normal ear canal and TM Mouth/Throat: Oropharynx is clear and moist.  Eyes: Conjunctivae and EOM are normal.  Neck: Neck supple. No tracheal deviation present. No thyromegaly present. No carotid bruit  Cardiovascular: Normal rate, regular rhythm and normal heart sounds.  No murmur heard.  Mild bl LE edema, varicose veins b/l LE legs Pulmonary/Chest: Effort normal and breath sounds normal. No respiratory distress. She has no wheezes. She has no rales.  Breast: deferred   Abdominal: Soft. She exhibits no distension. There is no tenderness.  Lymphadenopathy: She has no cervical  adenopathy.  Skin: Skin is warm and dry. She is not diaphoretic. Minimal erythema b/l LE from stasis dermatitis Psychiatric: She has a normal mood and affect. Her behavior is normal.        Assessment & Plan:   Physical exam: Screening blood work    ordered Immunizations  All up to date Colonoscopy  N/a due to age 78  Up to date  Dexa   Due - [facility] exams  Up to date  Exercise  none Weight   - ok for age Substance abuse   none  See Problem List for Assessment and Plan of chronic medical problems.      This visit occurred during the SARS-CoV-2 public health emergency.  Safety protocols were in place, including screening questions prior to the visit, additional usage of staff PPE, and extensive cleaning of exam room while observing appropriate contact time as indicated for disinfecting solutions.

## 2020-01-20 NOTE — Patient Instructions (Addendum)
Blood work was ordered.    All other Health Maintenance issues reviewed.   All recommended immunizations and age-appropriate screenings are up-to-date or discussed.  No immunization administered today.   Medications reviewed and updated.  Changes include :   none    Please followup in 6 months    Health Maintenance, Female Adopting a healthy lifestyle and getting preventive care are important in promoting health and wellness. Ask your health care provider about:  The right schedule for you to have regular tests and exams.  Things you can do on your own to prevent diseases and keep yourself healthy. What should I know about diet, weight, and exercise? Eat a healthy diet   Eat a diet that includes plenty of vegetables, fruits, low-fat dairy products, and lean protein.  Do not eat a lot of foods that are high in solid fats, added sugars, or sodium. Maintain a healthy weight Body mass index (BMI) is used to identify weight problems. It estimates body fat based on height and weight. Your health care provider can help determine your BMI and help you achieve or maintain a healthy weight. Get regular exercise Get regular exercise. This is one of the most important things you can do for your health. Most adults should:  Exercise for at least 150 minutes each week. The exercise should increase your heart rate and make you sweat (moderate-intensity exercise).  Do strengthening exercises at least twice a week. This is in addition to the moderate-intensity exercise.  Spend less time sitting. Even light physical activity can be beneficial. Watch cholesterol and blood lipids Have your blood tested for lipids and cholesterol at 82 years of age, then have this test every 5 years. Have your cholesterol levels checked more often if:  Your lipid or cholesterol levels are high.  You are older than 82 years of age.  You are at high risk for heart disease. What should I know about cancer  screening? Depending on your health history and family history, you may need to have cancer screening at various ages. This may include screening for:  Breast cancer.  Cervical cancer.  Colorectal cancer.  Skin cancer.  Lung cancer. What should I know about heart disease, diabetes, and high blood pressure? Blood pressure and heart disease  High blood pressure causes heart disease and increases the risk of stroke. This is more likely to develop in people who have high blood pressure readings, are of African descent, or are overweight.  Have your blood pressure checked: ? Every 3-5 years if you are 18-39 years of age. ? Every year if you are 40 years old or older. Diabetes Have regular diabetes screenings. This checks your fasting blood sugar level. Have the screening done:  Once every three years after age 40 if you are at a normal weight and have a low risk for diabetes.  More often and at a younger age if you are overweight or have a high risk for diabetes. What should I know about preventing infection? Hepatitis B If you have a higher risk for hepatitis B, you should be screened for this virus. Talk with your health care provider to find out if you are at risk for hepatitis B infection. Hepatitis C Testing is recommended for:  Everyone born from 1945 through 1965.  Anyone with known risk factors for hepatitis C. Sexually transmitted infections (STIs)  Get screened for STIs, including gonorrhea and chlamydia, if: ? You are sexually active and are younger than 82 years of   age. ? You are older than 82 years of age and your health care provider tells you that you are at risk for this type of infection. ? Your sexual activity has changed since you were last screened, and you are at increased risk for chlamydia or gonorrhea. Ask your health care provider if you are at risk.  Ask your health care provider about whether you are at high risk for HIV. Your health care provider may  recommend a prescription medicine to help prevent HIV infection. If you choose to take medicine to prevent HIV, you should first get tested for HIV. You should then be tested every 3 months for as long as you are taking the medicine. Pregnancy  If you are about to stop having your period (premenopausal) and you may become pregnant, seek counseling before you get pregnant.  Take 400 to 800 micrograms (mcg) of folic acid every day if you become pregnant.  Ask for birth control (contraception) if you want to prevent pregnancy. Osteoporosis and menopause Osteoporosis is a disease in which the bones lose minerals and strength with aging. This can result in bone fractures. If you are 65 years old or older, or if you are at risk for osteoporosis and fractures, ask your health care provider if you should:  Be screened for bone loss.  Take a calcium or vitamin D supplement to lower your risk of fractures.  Be given hormone replacement therapy (HRT) to treat symptoms of menopause. Follow these instructions at home: Lifestyle  Do not use any products that contain nicotine or tobacco, such as cigarettes, e-cigarettes, and chewing tobacco. If you need help quitting, ask your health care provider.  Do not use street drugs.  Do not share needles.  Ask your health care provider for help if you need support or information about quitting drugs. Alcohol use  Do not drink alcohol if: ? Your health care provider tells you not to drink. ? You are pregnant, may be pregnant, or are planning to become pregnant.  If you drink alcohol: ? Limit how much you use to 0-1 drink a day. ? Limit intake if you are breastfeeding.  Be aware of how much alcohol is in your drink. In the [place] one drink equals one 12 oz bottle of beer (355 mL), one 5 oz glass of wine (148 mL), or one 1 oz glass of hard liquor (44 mL). General instructions  Schedule regular health, dental, and eye exams.  Stay current with your  vaccines.  Tell your health care provider if: ? You often feel depressed. ? You have ever been abused or do not feel safe at home. Summary  Adopting a healthy lifestyle and getting preventive care are important in promoting health and wellness.  Follow your health care provider's instructions about healthy diet, exercising, and getting tested or screened for diseases.  Follow your health care provider's instructions on monitoring your cholesterol and blood pressure. This information is not intended to replace advice given to you by your health care provider. Make sure you discuss any questions you have with your health care provider. Document Revised: 11/01/2018 Document Reviewed: 11/01/2018 Elsevier Patient Education  2020 [employer]vier Inc.  

## 2020-01-21 ENCOUNTER — Ambulatory Visit (INDEPENDENT_AMBULATORY_CARE_PROVIDER_SITE_OTHER): Payer: Medicare HMO | Admitting: Internal Medicine

## 2020-01-21 ENCOUNTER — Other Ambulatory Visit: Payer: Self-pay

## 2020-01-21 ENCOUNTER — Encounter: Payer: Self-pay | Admitting: Internal Medicine

## 2020-01-21 VITALS — BP 140/82 | HR 87 | Temp 98.8°F | Resp 16 | Ht 63.0 in | Wt 164.0 lb

## 2020-01-21 DIAGNOSIS — R600 Localized edema: Secondary | ICD-10-CM

## 2020-01-21 DIAGNOSIS — Z0000 Encounter for general adult medical examination without abnormal findings: Secondary | ICD-10-CM | POA: Diagnosis not present

## 2020-01-21 DIAGNOSIS — I2510 Atherosclerotic heart disease of native coronary artery without angina pectoris: Secondary | ICD-10-CM

## 2020-01-21 DIAGNOSIS — I2583 Coronary atherosclerosis due to lipid rich plaque: Secondary | ICD-10-CM

## 2020-01-21 DIAGNOSIS — K219 Gastro-esophageal reflux disease without esophagitis: Secondary | ICD-10-CM

## 2020-01-21 DIAGNOSIS — J301 Allergic rhinitis due to pollen: Secondary | ICD-10-CM

## 2020-01-21 DIAGNOSIS — I4891 Unspecified atrial fibrillation: Secondary | ICD-10-CM | POA: Diagnosis not present

## 2020-01-21 DIAGNOSIS — E782 Mixed hyperlipidemia: Secondary | ICD-10-CM | POA: Diagnosis not present

## 2020-01-21 DIAGNOSIS — R7303 Prediabetes: Secondary | ICD-10-CM | POA: Diagnosis not present

## 2020-01-21 DIAGNOSIS — I10 Essential (primary) hypertension: Secondary | ICD-10-CM

## 2020-01-21 DIAGNOSIS — M85851 Other specified disorders of bone density and structure, right thigh: Secondary | ICD-10-CM

## 2020-01-21 DIAGNOSIS — N1831 Chronic kidney disease, stage 3a: Secondary | ICD-10-CM

## 2020-01-21 DIAGNOSIS — M85852 Other specified disorders of bone density and structure, left thigh: Secondary | ICD-10-CM

## 2020-01-21 DIAGNOSIS — I872 Venous insufficiency (chronic) (peripheral): Secondary | ICD-10-CM

## 2020-01-21 NOTE — Assessment & Plan Note (Signed)
Chronic Following with cardiology No concerning symptoms Cbc, cmp, lipids, tsh

## 2020-01-21 NOTE — Assessment & Plan Note (Addendum)
Chronic, controlled Related to chronic venous insufficiency Continue lasix, compression socks, elevation Encouraged regular walking

## 2020-01-21 NOTE — Assessment & Plan Note (Signed)
Chronic GERD controlled Continue daily medication-omeprazole 20 mg daily  

## 2020-01-21 NOTE — Assessment & Plan Note (Signed)
Chronic Stable cmp 

## 2020-01-21 NOTE — Assessment & Plan Note (Signed)
Chronic Osteopenia with high frax On prolia - dur for an injection - working on PA Taking vitamin d daily Will start calcium one time a day - due to hypercalcemia  - improved off calcium

## 2020-01-21 NOTE — Assessment & Plan Note (Signed)
Chronic, controlled Continue singulair, allegra, flonase

## 2020-01-21 NOTE — Assessment & Plan Note (Signed)
Chronic Check lipid panel  Continue daily statin Regular exercise and healthy diet encouraged  

## 2020-01-21 NOTE — Assessment & Plan Note (Signed)
Saw Dr Betsy Pries on Effaclar for spots on legs and betamethasone 0.05 % cream on redness as needed Wears compression most days Elevating the legs

## 2020-01-21 NOTE — Assessment & Plan Note (Signed)
Chronic Following with cardiology On eliquis, metoprolol Cbc, cmp

## 2020-01-21 NOTE — Assessment & Plan Note (Signed)
Chronic Check a1c Low sugar / carb diet Stressed regular exercise  

## 2020-01-21 NOTE — Assessment & Plan Note (Signed)
Improved after stopping calcium supplementation Recent labs normal Start calcium 1 pill daily Recheck pth, calcium, vitamin d in 6 months

## 2020-01-21 NOTE — Assessment & Plan Note (Signed)
Chronic BP well controlled Current regimen effective and well tolerated Continue current medications at current doses cmp  

## 2020-01-25 ENCOUNTER — Telehealth: Payer: Self-pay

## 2020-01-25 NOTE — Telephone Encounter (Signed)
LVM for patient to call back and schedule Bone Density Scan.

## 2020-02-04 ENCOUNTER — Encounter: Payer: Self-pay | Admitting: Internal Medicine

## 2020-02-14 ENCOUNTER — Telehealth: Payer: Self-pay | Admitting: Internal Medicine

## 2020-02-14 NOTE — Telephone Encounter (Signed)
   Patient would like information on Prolia copay, prior to scheduling.

## 2020-02-15 ENCOUNTER — Telehealth: Payer: Self-pay

## 2020-02-15 NOTE — Telephone Encounter (Signed)
Appointment rescheduled.

## 2020-02-15 NOTE — Telephone Encounter (Signed)
Pt was scheduled for a bone density next Friday but they are off next Friday. Will you reschedule this please.

## 2020-02-15 NOTE — Telephone Encounter (Signed)
Spoke to pt. Appointment scheduled.

## 2020-02-15 NOTE — Telephone Encounter (Signed)
Called patient and left VM to call back and reschedule.

## 2020-02-18 ENCOUNTER — Other Ambulatory Visit: Payer: Self-pay

## 2020-02-18 ENCOUNTER — Ambulatory Visit (INDEPENDENT_AMBULATORY_CARE_PROVIDER_SITE_OTHER): Payer: Medicare HMO | Admitting: *Deleted

## 2020-02-18 DIAGNOSIS — M810 Age-related osteoporosis without current pathological fracture: Secondary | ICD-10-CM | POA: Diagnosis not present

## 2020-02-18 MED ORDER — DENOSUMAB 60 MG/ML ~~LOC~~ SOSY
60.0000 mg | PREFILLED_SYRINGE | Freq: Once | SUBCUTANEOUS | Status: AC
  Administered 2020-02-18: 60 mg via SUBCUTANEOUS

## 2020-02-18 NOTE — Progress Notes (Signed)
Pls cosign for prolia inj../lmb 

## 2020-02-22 ENCOUNTER — Other Ambulatory Visit: Payer: Medicare HMO

## 2020-02-25 ENCOUNTER — Ambulatory Visit (INDEPENDENT_AMBULATORY_CARE_PROVIDER_SITE_OTHER)
Admission: RE | Admit: 2020-02-25 | Discharge: 2020-02-25 | Disposition: A | Discharge status: Home / Self Care | Payer: Medicare HMO | Source: Ambulatory Visit

## 2020-02-25 ENCOUNTER — Other Ambulatory Visit: Payer: Self-pay

## 2020-02-25 DIAGNOSIS — M85852 Other specified disorders of bone density and structure, left thigh: Secondary | ICD-10-CM | POA: Diagnosis not present

## 2020-02-25 DIAGNOSIS — M85851 Other specified disorders of bone density and structure, right thigh: Secondary | ICD-10-CM

## 2020-03-03 DIAGNOSIS — M5136 Other intervertebral disc degeneration, lumbar region: Secondary | ICD-10-CM | POA: Diagnosis not present

## 2020-03-03 DIAGNOSIS — R69 Illness, unspecified: Secondary | ICD-10-CM | POA: Diagnosis not present

## 2020-03-03 DIAGNOSIS — M545 Low back pain: Secondary | ICD-10-CM | POA: Diagnosis not present

## 2020-03-03 DIAGNOSIS — Z79899 Other long term (current) drug therapy: Secondary | ICD-10-CM | POA: Diagnosis not present

## 2020-03-06 ENCOUNTER — Other Ambulatory Visit: Payer: Self-pay | Admitting: Internal Medicine

## 2020-03-24 ENCOUNTER — Other Ambulatory Visit: Payer: Self-pay | Admitting: Internal Medicine

## 2020-03-24 DIAGNOSIS — H0100A Unspecified blepharitis right eye, upper and lower eyelids: Secondary | ICD-10-CM | POA: Diagnosis not present

## 2020-03-24 DIAGNOSIS — H43813 Vitreous degeneration, bilateral: Secondary | ICD-10-CM | POA: Diagnosis not present

## 2020-03-24 DIAGNOSIS — H401131 Primary open-angle glaucoma, bilateral, mild stage: Secondary | ICD-10-CM | POA: Diagnosis not present

## 2020-03-24 DIAGNOSIS — H04123 Dry eye syndrome of bilateral lacrimal glands: Secondary | ICD-10-CM | POA: Diagnosis not present

## 2020-04-18 ENCOUNTER — Other Ambulatory Visit: Payer: Self-pay | Admitting: Cardiology

## 2020-04-22 ENCOUNTER — Other Ambulatory Visit: Payer: Self-pay | Admitting: Cardiology

## 2020-04-25 ENCOUNTER — Telehealth: Payer: Self-pay | Admitting: Cardiology

## 2020-04-25 MED ORDER — APIXABAN 5 MG PO TABS: 5.0000 mg | ORAL_TABLET | Freq: Two times a day (BID) | ORAL | 5 refills | Status: DC

## 2020-04-25 NOTE — Telephone Encounter (Signed)
New Message    Pts daughter is calling about pts Eliquis and says pt will be out tomorrow and doesn't know why the medication has not been authorized    Please advise

## 2020-04-25 NOTE — Telephone Encounter (Signed)
Spoke with patient's daughter. Eliquis refill sent to preferred pharmacy. Follow up appointment already scheduled for August.

## 2020-05-01 ENCOUNTER — Other Ambulatory Visit: Payer: Self-pay | Admitting: Cardiology

## 2020-05-29 ENCOUNTER — Other Ambulatory Visit: Payer: Self-pay | Admitting: Internal Medicine

## 2020-05-30 ENCOUNTER — Telehealth: Payer: Self-pay | Admitting: Internal Medicine

## 2020-05-30 NOTE — Telephone Encounter (Signed)
Spoke to pharmacy - Dicyclomine has already been filled, ready to be picked up, and is only $10.  Patient is aware.

## 2020-06-04 ENCOUNTER — Telehealth: Payer: Self-pay

## 2020-06-04 NOTE — Telephone Encounter (Signed)
LMTCB to reschedule 06/23/20 appointment-MD out of the office 

## 2020-06-08 ENCOUNTER — Other Ambulatory Visit: Payer: Self-pay | Admitting: Internal Medicine

## 2020-06-23 ENCOUNTER — Other Ambulatory Visit: Payer: Self-pay | Admitting: Internal Medicine

## 2020-06-23 ENCOUNTER — Ambulatory Visit: Payer: Medicare HMO | Admitting: Endocrinology

## 2020-06-24 ENCOUNTER — Other Ambulatory Visit: Payer: Self-pay | Admitting: Cardiology

## 2020-06-24 DIAGNOSIS — E7800 Pure hypercholesterolemia, unspecified: Secondary | ICD-10-CM

## 2020-06-25 NOTE — Progress Notes (Signed)
Subjective:    Patient ID: Valerie Rollins, female    DOB: 1936/08/29, 84 y.o.   MRN: [id number]  HPI The patient is here for an acute visit.   Rash under breast:  B/l breasts have a rash.  It started several days ago.  It is red.  It does not itch.  She has been putting on nystatin.  It may have helped a little-she is not sure.  She denies rash elsewhere.     Medications and allergies reviewed with patient and updated if appropriate.  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   Cardiomegaly 02/20/2015   DDD (degenerative disc disease), lumbar 07/20/2012   Atrial fibrillation ([place]) 11/15/2011   Syncope 11/12/2011   RBBB (right bundle branch block) 11/12/2011   SKIN CANCER, HX OF 11/18/2009   Diverticulosis of large intestine 10/31/2008   HEART MURMUR, SYSTOLIC 54/65/6812   VARICOSE VEINS, LOWER EXTREMITIES 03/13/2008   GERD 02/20/2008   IBS 02/20/2008   Osteoarthritis 02/20/2008   Hyperlipidemia 12/01/2006   Essential hypertension 12/01/2006   SUPERFICIAL THROMBOPHLEBITIS 12/01/2006   DVT, HX OF 12/01/2006    Current Outpatient Medications on File Prior to Visit  Medication Sig Dispense Refill   albuterol (VENTOLIN HFA) 108 (90 Base) MCG/ACT inhaler Inhale 2 puffs into the lungs every 6 (six) hours as needed for wheezing or shortness of breath. 6.7 g 5   apixaban (ELIQUIS) 5 MG TABS tablet  Take 1 tablet (5 mg total) by mouth 2 (two) times daily. 60 tablet 5   Ascorbic Acid (VITAMIN C) 100 MG tablet Take 100 mg by mouth daily.     Cholecalciferol (VITAMIN D3) 1000 UNITS CAPS Take 1,000 Units by mouth daily.      Coenzyme Q10 (CO Q 10 PO) Take by mouth daily.     dicyclomine (BENTYL) 20 MG tablet TAKE 1 TABLET BY MOUTH EVERY 6 HOURS AS NEEDED FOR SPASMS 360 tablet 1   fexofenadine (ALLEGRA) 180 MG tablet Take 1 tablet (180 mg total) by mouth daily. 30 tablet 11   fluticasone (FLONASE) 50 MCG/ACT nasal spray Place 2 sprays into both nostrils daily. 16 g 6   furosemide (LASIX) 40 MG tablet TAKE 1 TABLET BY MOUTH EVERY DAY 90 tablet 0   latanoprost (XALATAN) 0.005 % ophthalmic solution Place 1 drop into both eyes daily.       losartan (COZAAR) 25 MG tablet TAKE 1 TABLET (25 MG TOTAL) BY MOUTH DAILY. 90 tablet 3   metoprolol tartrate (LOPRESSOR) 25 MG tablet TAKE 2 TABLETS EVERY MORNING AND 1 TABLET IN THE EVENING 270 tablet 0   montelukast (SINGULAIR) 10 MG tablet TAKE 1 TABLET BY MOUTH EVERYDAY AT BEDTIME 90 tablet 1   omeprazole (PRILOSEC) 20 MG capsule TAKE 1 CAPSULE BY MOUTH EVERY DAY 90 capsule 1   rosuvastatin (CRESTOR) 10 MG tablet TAKE 1 TABLET BY MOUTH EVERY DAY 90 tablet 3   traMADol (ULTRAM) 50 MG  tablet Take 1 tablet (50 mg total) by mouth every 6 (six) hours as needed. Dr.Ramos (Patient taking differently: Take 50 mg by mouth every 6 (six) hours as needed. Dr.Ramos; pt takes 1 tablet every night, maybe 1 during the day as needed) 30 tablet 0   No current facility-administered medications on file prior to visit.    Past Medical History:  Diagnosis Date   Ankle fracture, left 11/12/2011   Atrial fibrillation ([place])    Post op after repair of ankle fracture   Atrial myxoma 03/2015   Resected at the time of CABG with LAA ligation   CAD (coronary artery disease) 03/2015   LAD 80%, otherwise minimal CAD. s/p LIMA-LAD   Diverticulitis 2002   based on CT  scan   Diverticulosis    Dr Henrene Pastor   DVT (deep venous thrombosis) ([place]) 2000   no trigger   Endometriosis    Gastritis 2006   @ Endo   GERD (gastroesophageal reflux disease)    Hiatal hernia    Hyperlipidemia    Hypertension    IBS (irritable bowel syndrome)    Ocular hypertension    glaucoma suspect, Dr. Kathrin Penner   Skin cancer    Superficial phlebitis     X 2, 1999, 2000   Syncope 11/12/11   in context CAP . Carotid Doppler exam was negative. 2D echocardiogram revealed mild dilation of the left atrium and moderate increase in the systolic pressureof  the pulmonary artery   UTI (lower urinary tract infection) 01/2013   Strep bovis ; PCN sensitive    Past Surgical History:  Procedure Laterality Date   CARDIAC CATHETERIZATION N/A 03/28/2015   Procedure: Left Heart Cath and Coronary Angiography;  Surgeon: Jolaine Artist, MD;  Location: [facility];  Service: Cardiovascular;  Laterality: N/A;   CATARACT EXTRACTION Bilateral    CATARACT EXTRACTION W/ INTRAOCULAR LENS IMPLANT     OS; Dr Kathrin Penner   COLONOSCOPY  1995, 2002, 2012   diverticulosis; Dr Henrene Pastor   CORONARY ARTERY BYPASS GRAFT N/A 04/01/2015   Procedure: CORONARY ARTERY BYPASS GRAFTING (CABG), ON PUMP, TIMES ONE, USING LEFT INTERNAL MAMMARY ARTERY;  Surgeon: Ivin Poot, MD;  Location: [facility];  Service: Open Heart Surgery;  Laterality: N/A;   EXCISION OF ATRIAL MYXOMA N/A 04/01/2015   Procedure: EXCISION OF ATRIAL MYXOMA;  Surgeon: Ivin Poot, MD;  Location: [facility];  Service: Open Heart Surgery;  Laterality: N/A;   ORIF ANKLE FRACTURE  11/14/2011   Procedure: OPEN REDUCTION INTERNAL FIXATION (ORIF) ANKLE FRACTURE;  Surgeon: Valerie Rhein;  Location: [facility];  Service: Orthopedics;  Laterality: Left;  open reduction internal fixation trimalleolar ankle fracture   TEE WITHOUT CARDIOVERSION N/A 04/01/2015   Procedure: TRANSESOPHAGEAL ECHOCARDIOGRAM (TEE);  Surgeon: Ivin Poot,  MD;  Location: [facility];  Service: Open Heart Surgery;  Laterality: N/A;   TONSILLECTOMY     TOTAL ABDOMINAL HYSTERECTOMY W/ BILATERAL SALPINGOOPHORECTOMY  1980   dysfunctional menses, endometriosis   VARICOSE VEIN SURGERY     1960, 1965, 2009    Social History   Socioeconomic History   Marital status: Married    Spouse name: Not on file   Number of children: 2   Years of education: Not on file   Highest education level: Not on file  Occupational History   Occupation: Retired    Fish farm manager: RETIRED  Tobacco Use   Smoking status: Never Smoker   Smokeless tobacco: Never Used  Media planner  Vaping Use: Never used  Substance and Sexual Activity   Alcohol use: No   Drug use: No   Sexual activity: Not on file  Other Topics Concern   Not on file  Social History Narrative   REG EXERCISE   HAD COLONOSCOPY AND ENDOSCOPY DONE DATES UNKNOWN   Social Determinants of Health   Financial Resource Strain:    Difficulty of Paying Living Expenses:   Food Insecurity:    Worried About Charity fundraiser in the Last Year:    Arboriculturist in the Last Year:   Transportation Needs:    Film/video editor (Medical):    Lack of Transportation (Non-Medical):   Physical Activity:    Days of Exercise per Week:    Minutes of Exercise per Session:   Stress:    Feeling of Stress :   Social Connections:    Frequency of Communication with Friends and Family:    Frequency of Social Gatherings with Friends and Family:    Attends Religious Services:    Active Member of Clubs or Organizations:    Attends Music therapist:    Marital Status:     Family History  Problem Relation Age of Onset   Heart attack Father 104   Alzheimer's disease Mother 41       TIAs   Colon cancer Maternal Grandmother    Heart attack Paternal Grandmother 61   Hyperlipidemia Brother    Hypertension Brother    Diverticulitis Daughter        S/P colectomy   Diabetes  Neg Hx    Stroke Neg Hx    Hypercalcemia Neg Hx    Stomach cancer Neg Hx    Pancreatic cancer Neg Hx    Esophageal cancer Neg Hx     Review of Systems     Objective:   Vitals:   06/26/20 1101  BP: (!) 142/82  Pulse: 95  Temp: 98.8 F (37.1 C)  SpO2: 95%   BP Readings from Last 3 Encounters:  06/26/20 (!) 142/82  01/21/20 140/82  12/24/19 102/64   Wt Readings from Last 3 Encounters:  06/26/20 162 lb 12.8 oz (73.8 kg)  01/21/20 164 lb (74.4 kg)  12/24/19 168 lb 6.4 oz (76.4 kg)   Body mass index is 28.84 kg/m.   Physical Exam Constitutional:      General: She is not in acute distress.    Appearance: Normal appearance. She is not ill-appearing.  Skin:    General: Skin is warm and dry.     Findings: Erythema (Patchy erythema under bilateral breasts, no satellite lesions, no open wounds or discharge.  No swelling) present.  Neurological:     Mental Status: She is alert.            Assessment & Plan:    See Problem List for Assessment and Plan of chronic medical problems.    This visit occurred during the SARS-CoV-2 public health emergency.  Safety protocols were in place, including screening questions prior to the visit, additional usage of staff PPE, and extensive cleaning of exam room while observing appropriate contact time as indicated for disinfecting solutions.

## 2020-06-26 ENCOUNTER — Encounter: Payer: Self-pay | Admitting: Internal Medicine

## 2020-06-26 ENCOUNTER — Other Ambulatory Visit: Payer: Self-pay

## 2020-06-26 ENCOUNTER — Ambulatory Visit (INDEPENDENT_AMBULATORY_CARE_PROVIDER_SITE_OTHER): Payer: Medicare HMO | Admitting: Internal Medicine

## 2020-06-26 DIAGNOSIS — B372 Candidiasis of skin and nail: Secondary | ICD-10-CM | POA: Diagnosis not present

## 2020-06-26 MED ORDER — CLOTRIMAZOLE-BETAMETHASONE 1-0.05 % EX CREA: 1.0000 "application " | TOPICAL_CREAM | Freq: Two times a day (BID) | CUTANEOUS | 0 refills | Status: DC

## 2020-06-26 NOTE — Patient Instructions (Addendum)
Use the cream twice daily under the rash is gone.    Call if there is no improvement.

## 2020-06-26 NOTE — Assessment & Plan Note (Signed)
Acute B/l under breasts lotrisone BID Call if no improvement

## 2020-07-07 DIAGNOSIS — M5136 Other intervertebral disc degeneration, lumbar region: Secondary | ICD-10-CM | POA: Diagnosis not present

## 2020-07-07 DIAGNOSIS — M545 Low back pain: Secondary | ICD-10-CM | POA: Diagnosis not present

## 2020-07-07 DIAGNOSIS — Z79899 Other long term (current) drug therapy: Secondary | ICD-10-CM | POA: Diagnosis not present

## 2020-07-14 NOTE — Progress Notes (Signed)
HPI: FU CAD and atrial myxoma resection. Echo 4/16 orderded by primary care for cardiomegaly; this revealed normal LV function, left atrial mass; mild MR. Cardiac cath 5/16 showed 80 LAD. Preop carotid dopplers 5/16 with no significant stenosis. Patient had CABG with LIMA to LAD, resection of atrial myxoma and LAA oversewn. Patient had recurrent atrial fibrillation in September 2016. She has been treated with rate controlling medications and anticoagulation. Holter 4/18 showed atrial fibrillation, rate mildly decreased late PM and early AM but otherwise controlled.Echocardiogram January 2020 showed normal LV function, moderate left ventricular hypertrophy, trace aortic insufficiency, mildly dilated aortic root and severe left atrial enlargement. Since last seen,she has some dyspnea on exertion but no orthopnea or PND.  She has chronic mild pedal edema.  No chest pain or syncope.  No bleeding.  Current Outpatient Medications  Medication Sig Dispense Refill  . albuterol (VENTOLIN HFA) 108 (90 Base) MCG/ACT inhaler Inhale 2 puffs into the lungs every 6 (six) hours as needed for wheezing or shortness of breath. 6.7 g 5  . apixaban (ELIQUIS) 5 MG TABS tablet Take 1 tablet (5 mg total) by mouth 2 (two) times daily. 60 tablet 5  . Ascorbic Acid (VITAMIN C) 100 MG tablet Take 100 mg by mouth daily.    . Cholecalciferol (VITAMIN D3) 1000 UNITS CAPS Take 1,000 Units by mouth daily.     . clotrimazole-betamethasone (LOTRISONE) cream Apply 1 application topically 2 (two) times daily. 30 g 0  . Coenzyme Q10 (CO Q 10 PO) Take by mouth daily.    Marland Kitchen dicyclomine (BENTYL) 20 MG tablet TAKE 1 TABLET BY MOUTH EVERY 6 HOURS AS NEEDED FOR SPASMS 360 tablet 1  . fexofenadine (ALLEGRA) 180 MG tablet Take 1 tablet (180 mg total) by mouth daily. 30 tablet 11  . fluticasone (FLONASE) 50 MCG/ACT nasal spray Place 2 sprays into both nostrils daily. 16 g 6  . furosemide (LASIX) 40 MG tablet TAKE 1 TABLET BY MOUTH EVERY  DAY 90 tablet 0  . latanoprost (XALATAN) 0.005 % ophthalmic solution Place 1 drop into both eyes daily.      Marland Kitchen losartan (COZAAR) 25 MG tablet TAKE 1 TABLET (25 MG TOTAL) BY MOUTH DAILY. 90 tablet 3  . metoprolol tartrate (LOPRESSOR) 25 MG tablet TAKE 2 TABLETS EVERY MORNING AND 1 TABLET IN THE EVENING 270 tablet 0  . montelukast (SINGULAIR) 10 MG tablet TAKE 1 TABLET BY MOUTH EVERYDAY AT BEDTIME 90 tablet 1  . omeprazole (PRILOSEC) 20 MG capsule TAKE 1 CAPSULE BY MOUTH EVERY DAY 90 capsule 1  . rosuvastatin (CRESTOR) 10 MG tablet TAKE 1 TABLET BY MOUTH EVERY DAY 90 tablet 3  . traMADol (ULTRAM) 50 MG tablet Take 1 tablet (50 mg total) by mouth every 6 (six) hours as needed. Dr.Ramos (Patient taking differently: Take 50 mg by mouth every 6 (six) hours as needed. Dr.Ramos; pt takes 1 tablet every night, maybe 1 during the day as needed) 30 tablet 0   No current facility-administered medications for this visit.     Past Medical History:  Diagnosis Date  . Ankle fracture, left 11/12/2011  . Atrial fibrillation ([place])    Post op after repair of ankle fracture  . 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 CAD (coronary artery disease) 03/2015   LAD 80%, otherwise minimal CAD. s/p LIMA-LAD  . Diverticulitis 2002   based on CT scan  . Diverticulosis    Dr  Henrene Pastor  . DVT (deep venous thrombosis) ([place]) 2000   no trigger  . Endometriosis   . Gastritis 2006   @ Endo  . GERD (gastroesophageal reflux disease)   . Hiatal hernia   . Hyperlipidemia   . Hypertension   . IBS (irritable bowel syndrome)   . Ocular hypertension    glaucoma suspect, Dr. Kathrin Penner  . Skin cancer   . Superficial phlebitis     X 2, 1999, 2000  . Syncope 11/12/11   in context CAP . Carotid Doppler exam was negative. 2D echocardiogram revealed mild dilation of the left atrium and moderate increase in the systolic pressureof  the pulmonary artery  . UTI (lower urinary tract infection)  01/2013   Strep bovis ; PCN sensitive    Past Surgical History:  Procedure Laterality Date  . CARDIAC CATHETERIZATION N/A 03/28/2015   Procedure: Left Heart Cath and Coronary Angiography;  Surgeon: Jolaine Artist, MD;  Location: [facility];  Service: Cardiovascular;  Laterality: N/A;  . CATARACT EXTRACTION Bilateral   . CATARACT EXTRACTION W/ INTRAOCULAR LENS IMPLANT     OS; Dr Kathrin Penner  . COLONOSCOPY  1995, 2002, 2012   diverticulosis; Dr Henrene Pastor  . CORONARY ARTERY BYPASS GRAFT N/A 04/01/2015   Procedure: CORONARY ARTERY BYPASS GRAFTING (CABG), ON PUMP, TIMES ONE, USING LEFT INTERNAL MAMMARY ARTERY;  Surgeon: Ivin Poot, MD;  Location: [facility];  Service: Open Heart Surgery;  Laterality: N/A;  . EXCISION OF ATRIAL MYXOMA N/A 04/01/2015   Procedure: EXCISION OF ATRIAL MYXOMA;  Surgeon: Ivin Poot, MD;  Location: [facility];  Service: Open Heart Surgery;  Laterality: N/A;  . ORIF ANKLE FRACTURE  11/14/2011   Procedure: OPEN REDUCTION INTERNAL FIXATION (ORIF) ANKLE FRACTURE;  Surgeon: Colin Rhein;  [facility];  Service: Orthopedics;  Laterality: Left;  open reduction internal fixation trimalleolar ankle fracture  . TEE WITHOUT CARDIOVERSION N/A 04/01/2015   Procedure: TRANSESOPHAGEAL ECHOCARDIOGRAM (TEE);  Surgeon: Ivin Poot, MD;  Location: [facility];  Service: Open Heart Surgery;  Laterality: N/A;  . TONSILLECTOMY    . TOTAL ABDOMINAL HYSTERECTOMY W/ BILATERAL SALPINGOOPHORECTOMY  1980   dysfunctional menses, endometriosis  . [place], 2009    Social History   Socioeconomic History  . Marital status: Married    Spouse name: Not on file  . Number of children: 2  . Years of education: Not on file  . Highest education level: Not on file  Occupational History  . Occupation: Retired    Fish farm manager: RETIRED  Tobacco Use  . Smoking status: Never Smoker  . Smokeless tobacco: Never Used  Vaping Use  . Vaping Use: Never used    Substance and Sexual Activity  . Alcohol use: No  . Drug use: No  . Sexual activity: Not on file  Other Topics Concern  . Not on file  Social History Narrative   REG EXERCISE   HAD COLONOSCOPY AND ENDOSCOPY DONE DATES UNKNOWN   Social Determinants of Health   Financial Resource Strain:   . Difficulty of Paying Living Expenses: Not on file  Food Insecurity:   . Worried About Charity fundraiser in the Last Year: Not on file  . Ran Out of Food in the Last Year: Not on file  Transportation Needs:   . Lack of Transportation (Medical): Not on file  . Lack of Transportation (Non-Medical): Not on file  Physical Activity:   . Days  of Exercise per Week: Not on file  . Minutes of Exercise per Session: Not on file  Stress:   . Feeling of Stress : Not on file  Social Connections:   . Frequency of Communication with Friends and Family: Not on file  . Frequency of Social Gatherings with Friends and Family: Not on file  . Attends Religious Services: Not on file  . Active Member of Clubs or Organizations: Not on file  . Attends Archivist Meetings: Not on file  . Marital Status: Not on file  Intimate Partner Violence:   . Fear of Current or Ex-Partner: Not on file  . Emotionally Abused: Not on file  . Physically Abused: Not on file  . Sexually Abused: Not on file    Family History  Problem Relation Age of Onset  . Heart attack Father 67  . Alzheimer's disease Mother 23       TIAs  . Colon cancer Maternal Grandmother   . Heart attack Paternal Grandmother 82  . Hyperlipidemia Brother   . Hypertension Brother   . Diverticulitis Daughter        S/P colectomy  . Diabetes Neg Hx   . Stroke Neg Hx   . Hypercalcemia Neg Hx   . Stomach cancer Neg Hx   . Pancreatic cancer Neg Hx   . Esophageal cancer Neg Hx     ROS: no fevers or chills, productive cough, hemoptysis, dysphasia, odynophagia, melena, hematochezia, dysuria, hematuria, rash, seizure activity, orthopnea, PND,  claudication. Remaining systems are negative.  Physical Exam: Well-developed well-nourished in no acute distress.  Skin is warm and dry.  HEENT is normal.  Neck is supple.  Chest is clear to auscultation with normal expansion.  Cardiovascular exam is irregular Abdominal exam nontender or distended. No masses palpated. Extremities show trace to 1+ ankle edema. neuro grossly intact  ECG-atrial fibrillation at a rate of 75, right bundle branch block.  Personally reviewed  A/P  1 permanent atrial fibrillation-plan to continue present dose of beta-blocker for rate control.  Continue apixaban.  We will have most recent CBC and bmet forwarded to [place] from primary care.  2 hypertension-patient's blood pressure is controlled today.  Continue present medical regimen.  3 hyperlipidemia-continue statin.  Will have most recent lipids and liver forwarded to [place] from primary care.  4 coronary artery disease status post coronary artery bypass and graft-patient denies chest pain.  Plan to continue medical therapy with statin.  No aspirin given need for apixaban.  5 history of atrial myxoma-status post resection.  Patient has not had recurrences on follow-up echocardiogram.  We will repeat study January 2022.  6 peripheral edema-she continues to have lower extremity edema.  She will continue with leg elevation and compression hose.  She will continue with Lasix 40 mg daily and I have asked her to take an additional 20 mg daily as needed.  We will have her most recent bmet forwarded to [place] from primary care.  Kirk Ruths, MD

## 2020-07-18 ENCOUNTER — Other Ambulatory Visit: Payer: Self-pay

## 2020-07-18 ENCOUNTER — Ambulatory Visit (INDEPENDENT_AMBULATORY_CARE_PROVIDER_SITE_OTHER): Payer: Medicare HMO | Admitting: Endocrinology

## 2020-07-18 ENCOUNTER — Encounter: Payer: Self-pay | Admitting: Endocrinology

## 2020-07-18 LAB — VITAMIN D 25 HYDROXY (VIT D DEFICIENCY, FRACTURES): VITD: 42.32 ng/mL (ref 30.00–100.00)

## 2020-07-18 NOTE — Patient Instructions (Addendum)
Blood tests are requested for you today.  We'll let you know about the results.   Please continue the same Prolia.   If the results of the blood are good, I would be happy to see you back here as needed.

## 2020-07-18 NOTE — Progress Notes (Signed)
Subjective:    Patient ID: Valerie Rollins, female    DOB: 1935-12-06, 84 y.o.   MRN: [id number]  HPI Pt returns for f/u of hypercalcemia (noted 2020; she stopped Ca++ supplement, and it resolved; she has never had urolithiasis; she has had these fractures: left ankle (2012), and right little finger (2000); she has h/o osteoporosis; last Prolia injection was 3/21; Vit-D supplement may have normalized PTH.  Ca++ tabs, normalization of PTH, or Prolia may have normalized Ca++).  She takes vit-D, 1000 units/day.  Denies cramps and numbness.   Past Medical History:  Diagnosis Date  . Ankle fracture, left 11/12/2011  . Atrial fibrillation ([place])    Post op after repair of ankle fracture  . 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 CAD (coronary artery disease) 03/2015   LAD 80%, otherwise minimal CAD. s/p LIMA-LAD  . Diverticulitis 2002   based on CT scan  . Diverticulosis    Dr Henrene Pastor  . DVT (deep venous thrombosis) ([place]) 2000   no trigger  . Endometriosis   . Gastritis 2006   @ Endo  . GERD (gastroesophageal reflux disease)   . Hiatal hernia   . Hyperlipidemia   . Hypertension   . IBS (irritable bowel syndrome)   . Ocular hypertension    glaucoma suspect, Dr. Kathrin Penner  . Skin cancer   . Superficial phlebitis     X 2, 1999, 2000  . Syncope 11/12/11   in context CAP . Carotid Doppler exam was negative. 2D echocardiogram revealed mild dilation of the left atrium and moderate increase in the systolic pressureof  the pulmonary artery  . UTI (lower urinary tract infection) 01/2013   Strep bovis ; PCN sensitive    Past Surgical History:  Procedure Laterality Date  . CARDIAC CATHETERIZATION N/A 03/28/2015   Procedure: Left Heart Cath and Coronary Angiography;  Surgeon: Jolaine Artist, MD;  Location: [facility];  Service: Cardiovascular;  Laterality: N/A;  . CATARACT EXTRACTION Bilateral   . CATARACT EXTRACTION W/ INTRAOCULAR LENS IMPLANT      OS; Dr Kathrin Penner  . COLONOSCOPY  1995, 2002, 2012   diverticulosis; Dr Henrene Pastor  . CORONARY ARTERY BYPASS GRAFT N/A 04/01/2015   Procedure: CORONARY ARTERY BYPASS GRAFTING (CABG), ON PUMP, TIMES ONE, USING LEFT INTERNAL MAMMARY ARTERY;  Surgeon: Ivin Poot, MD;  Location: [facility];  Service: Open Heart Surgery;  Laterality: N/A;  . EXCISION OF ATRIAL MYXOMA N/A 04/01/2015   Procedure: EXCISION OF ATRIAL MYXOMA;  Surgeon: Ivin Poot, MD;  Location: [facility];  Service: Open Heart Surgery;  Laterality: N/A;  . ORIF ANKLE FRACTURE  11/14/2011   Procedure: OPEN REDUCTION INTERNAL FIXATION (ORIF) ANKLE FRACTURE;  Surgeon: Valerie Rhein;  [facility];  Service: Orthopedics;  Laterality: Left;  open reduction internal fixation trimalleolar ankle fracture  . TEE WITHOUT CARDIOVERSION N/A 04/01/2015   Procedure: TRANSESOPHAGEAL ECHOCARDIOGRAM (TEE);  Surgeon: Ivin Poot, MD;  Location: [facility];  Service: Open Heart Surgery;  Laterality: N/A;  . TONSILLECTOMY    . TOTAL ABDOMINAL HYSTERECTOMY W/ BILATERAL SALPINGOOPHORECTOMY  1980   dysfunctional menses, endometriosis  . [place], 2009    Social History   Socioeconomic History  . Marital status: Married    Spouse name: Not on file  . Number of children: 2  . Years of education: Not on file  . Highest education  level: Not on file  Occupational History  . Occupation: Retired    Fish farm manager: RETIRED  Tobacco Use  . Smoking status: Never Smoker  . Smokeless tobacco: Never Used  Vaping Use  . Vaping Use: Never used  Substance and Sexual Activity  . Alcohol use: No  . Drug use: No  . Sexual activity: Not on file  Other Topics Concern  . Not on file  Social History Narrative   REG EXERCISE   HAD COLONOSCOPY AND ENDOSCOPY DONE DATES UNKNOWN   Social Determinants of Health   Financial Resource Strain:   . Difficulty of Paying Living Expenses: Not on file  Food Insecurity:   . Worried About Paediatric nurse in the Last Year: Not on file  . Ran Out of Food in the Last Year: Not on file  Transportation Needs:   . Lack of Transportation (Medical): Not on file  . Lack of Transportation (Non-Medical): Not on file  Physical Activity:   . Days of Exercise per Week: Not on file  . Minutes of Exercise per Session: Not on file  Stress:   . Feeling of Stress : Not on file  Social Connections:   . Frequency of Communication with Friends and Family: Not on file  . Frequency of Social Gatherings with Friends and Family: Not on file  . Attends Religious Services: Not on file  . Active Member of Clubs or Organizations: Not on file  . Attends Archivist Meetings: Not on file  . Marital Status: Not on file  Intimate Partner Violence:   . Fear of Current or Ex-Partner: Not on file  . Emotionally Abused: Not on file  . Physically Abused: Not on file  . Sexually Abused: Not on file    Current Outpatient Medications on File Prior to Visit  Medication Sig Dispense Refill  . albuterol (VENTOLIN HFA) 108 (90 Base) MCG/ACT inhaler Inhale 2 puffs into the lungs every 6 (six) hours as needed for wheezing or shortness of breath. 6.7 g 5  . apixaban (ELIQUIS) 5 MG TABS tablet Take 1 tablet (5 mg total) by mouth 2 (two) times daily. 60 tablet 5  . Ascorbic Acid (VITAMIN C) 100 MG tablet Take 100 mg by mouth daily.    . Cholecalciferol (VITAMIN D3) 1000 UNITS CAPS Take 1,000 Units by mouth daily.     . clotrimazole-betamethasone (LOTRISONE) cream Apply 1 application topically 2 (two) times daily. 30 g 0  . Coenzyme Q10 (CO Q 10 PO) Take by mouth daily.    Marland Kitchen dicyclomine (BENTYL) 20 MG tablet TAKE 1 TABLET BY MOUTH EVERY 6 HOURS AS NEEDED FOR SPASMS 360 tablet 1  . fexofenadine (ALLEGRA) 180 MG tablet Take 1 tablet (180 mg total) by mouth daily. 30 tablet 11  . fluticasone (FLONASE) 50 MCG/ACT nasal spray Place 2 sprays into both nostrils daily. 16 g 6  . furosemide (LASIX) 40 MG tablet TAKE 1  TABLET BY MOUTH EVERY DAY 90 tablet 0  . latanoprost (XALATAN) 0.005 % ophthalmic solution Place 1 drop into both eyes daily.      Marland Kitchen losartan (COZAAR) 25 MG tablet TAKE 1 TABLET (25 MG TOTAL) BY MOUTH DAILY. 90 tablet 3  . metoprolol tartrate (LOPRESSOR) 25 MG tablet TAKE 2 TABLETS EVERY MORNING AND 1 TABLET IN THE EVENING 270 tablet 0  . montelukast (SINGULAIR) 10 MG tablet TAKE 1 TABLET BY MOUTH EVERYDAY AT BEDTIME 90 tablet 1  . omeprazole (PRILOSEC) 20 MG capsule TAKE 1  CAPSULE BY MOUTH EVERY DAY 90 capsule 1  . rosuvastatin (CRESTOR) 10 MG tablet TAKE 1 TABLET BY MOUTH EVERY DAY 90 tablet 3  . traMADol (ULTRAM) 50 MG tablet Take 1 tablet (50 mg total) by mouth every 6 (six) hours as needed. Dr.Ramos (Patient taking differently: Take 50 mg by mouth every 6 (six) hours as needed. Dr.Ramos; pt takes 1 tablet every night, maybe 1 during the day as needed) 30 tablet 0   No current facility-administered medications on file prior to visit.    Allergies  Allergen Reactions  . Oxybutynin Chloride     Tongue swelling; Rx from Dr Hessie Diener  . Propoxyphene N-Acetaminophen     REACTION: made pt blood to thin  . Alendronate Sodium     ? reaction  . Ramipril     REACTION: cough    Family History  Problem Relation Age of Onset  . Heart attack Father 21  . Alzheimer's disease Mother 57       TIAs  . Colon cancer Maternal Grandmother   . Heart attack Paternal Grandmother 82  . Hyperlipidemia Brother   . Hypertension Brother   . Diverticulitis Daughter        S/P colectomy  . Diabetes Neg Hx   . Stroke Neg Hx   . Hypercalcemia Neg Hx   . Stomach cancer Neg Hx   . Pancreatic cancer Neg Hx   . Esophageal cancer Neg Hx     BP 124/70   Pulse 64   Ht 5\' 3"  (1.6 m)   Wt 163 lb 12.8 oz (74.3 kg)   SpO2 98%   BMI 29.02 kg/m    Review of Systems     Objective:   Physical Exam VITAL SIGNS:  See vs page GENERAL: no distress EXT: 1+ leg edema.   GAIT: normal and steady.         Assessment & Plan:  Vit-D def: recheck today Hyperparathyroidism.  Improved.  Due for recheck. Osteoporosis, on rx  Patient Instructions  Blood tests are requested for you today.  We'll let you know about the results.   Please continue the same Prolia.   If the results of the blood are good, I would be happy to see you back here as needed.

## 2020-07-21 ENCOUNTER — Telehealth: Payer: Self-pay

## 2020-07-21 ENCOUNTER — Ambulatory Visit: Payer: Medicare HMO | Admitting: Cardiology

## 2020-07-21 ENCOUNTER — Encounter: Payer: Self-pay | Admitting: Cardiology

## 2020-07-21 ENCOUNTER — Other Ambulatory Visit: Payer: Self-pay

## 2020-07-21 VITALS — BP 122/82 | HR 75 | Ht 63.0 in | Wt 163.2 lb

## 2020-07-21 DIAGNOSIS — I2510 Atherosclerotic heart disease of native coronary artery without angina pectoris: Secondary | ICD-10-CM | POA: Diagnosis not present

## 2020-07-21 DIAGNOSIS — D151 Benign neoplasm of heart: Secondary | ICD-10-CM

## 2020-07-21 DIAGNOSIS — I4821 Permanent atrial fibrillation: Secondary | ICD-10-CM | POA: Diagnosis not present

## 2020-07-21 DIAGNOSIS — E7800 Pure hypercholesterolemia, unspecified: Secondary | ICD-10-CM

## 2020-07-21 DIAGNOSIS — I10 Essential (primary) hypertension: Secondary | ICD-10-CM | POA: Diagnosis not present

## 2020-07-21 LAB — PTH, INTACT AND CALCIUM
Calcium: 10 mg/dL (ref 8.6–10.4)
PTH: 33 pg/mL (ref 14–64)

## 2020-07-21 NOTE — Patient Instructions (Signed)
Medication Instructions:  MAY TAKE AN EXTRA 20 MG OF FUROSEMIDE AS NEEDED FOR SWELLING  *If you need a refill on your cardiac medications before your next appointment, please call your pharmacy*   Lab Work: If you have labs (blood work) drawn today and your tests are completely normal, you will receive your results only by: Marland Kitchen MyChart Message (if you have MyChart) OR . A paper copy in the mail If you have any lab test that is abnormal or we need to change your treatment, we will call you to review the results.   Follow-Up: At [facility], you and your health needs are our priority.  As part of our continuing mission to provide you with exceptional heart care, we have created designated Provider Care Teams.  These Care Teams include your primary Cardiologist (physician) and Advanced Practice Providers (APPs -  Physician Assistants and Nurse Practitioners) who all work together to provide you with the care you need, when you need it.  We recommend signing up for the patient portal called "MyChart".  Sign up information is provided on this After Visit Summary.  MyChart is used to connect with patients for Virtual Visits (Telemedicine).  Patients are able to view lab/test results, encounter notes, upcoming appointments, etc.  Non-urgent messages can be sent to your provider as well.   To learn more about what you can do with MyChart, go to [email or web address].    Your next appointment:   6 month(s)  The format for your next appointment:   In Person  Provider:   You may see Kirk Ruths, MD or one of the following Advanced Practice Providers on your designated Care Team:    Kerin Ransom, PA-C  [place], [place]  Coletta Memos, [place]

## 2020-07-21 NOTE — Telephone Encounter (Signed)
RESULTS  Results were reviewed by Dr. Ellison. A letter has been mailed to pt home address. For future reference, letter can be found in Epic. 

## 2020-07-21 NOTE — Telephone Encounter (Signed)
-----   Message from Renato Shin, MD sent at 07/21/2020 12:26 PM EDT ----- please contact patient: Normal results--good.  Please continue the same medication.  I have sent a prescription to your pharmacy.

## 2020-07-26 ENCOUNTER — Telehealth: Payer: Self-pay | Admitting: Internal Medicine

## 2020-07-26 NOTE — Telephone Encounter (Signed)
Due for prolia end of September - last prolia injection 02/18/20 - please schedule nurse visit for this.    prolia approved by insurance 07/10/2020-07/10/2021

## 2020-07-27 ENCOUNTER — Other Ambulatory Visit: Payer: Self-pay | Admitting: Cardiology

## 2020-07-29 NOTE — Telephone Encounter (Signed)
Does she have a copay or anything.Marland KitchenJohny Chess

## 2020-07-29 NOTE — Telephone Encounter (Signed)
Prolia is subject to a 20% coinsurance.  Patient would be responsible for a $250 copay.

## 2020-07-31 NOTE — Telephone Encounter (Signed)
Called pt there was no answer LMOM RTC..,/lmb 

## 2020-07-31 NOTE — Progress Notes (Signed)
Subjective:    Patient ID: Valerie Rollins, female    DOB: [birth year], 84 y.o.   MRN: [id number]  HPI The patient is here for follow up of their chronic medical problems, including Afib, GERD, allergic rhinitis, htn, b/l LE edema, osteoporosis on prolia.  Rash under breasts and new area in RLQ - ? Same thing or not.  She is using lotrisone. She thinks the nystatin worked better.   She is not exercising regularly.     Medications and allergies reviewed with patient and updated if appropriate.  Patient Active Problem List   Diagnosis Date Noted  . Chronic venous stasis dermatitis of both lower extremities 01/21/2020  . Hypercalcemia 07/19/2019  . CKD (chronic kidney disease) stage 3, GFR 30-59 ml/min 07/19/2019  . Candidal intertrigo 04/20/2018  . Angular stomatitis 04/20/2018  . Seasonal allergic rhinitis due to pollen 03/21/2018  . Bilateral leg edema 12/31/2016  . Prediabetes 01/10/2016  . Osteopenia 12/26/2015  . Mass of neck 07/07/2015  . Long-term (current) use of anticoagulants 04/16/2015  . S/P CABG x 1 04/01/2015  . CAD (coronary artery disease) 03/28/2015  . 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 Cardiomegaly 02/20/2015  . DDD (degenerative disc disease), lumbar 07/20/2012  . Atrial fibrillation ([place]) 11/15/2011  . Syncope 11/12/2011  . RBBB (right bundle branch block) 11/12/2011  . SKIN CANCER, HX OF 11/18/2009  . Diverticulosis of large intestine 10/31/2008  . HEART MURMUR, SYSTOLIC 93/81/0175  . VARICOSE VEINS, LOWER EXTREMITIES 03/13/2008  . GERD 02/20/2008  . IBS 02/20/2008  . Osteoarthritis 02/20/2008  . Hyperlipidemia 12/01/2006  . Essential hypertension 12/01/2006  . SUPERFICIAL THROMBOPHLEBITIS 12/01/2006  . DVT, HX OF 12/01/2006    Current Outpatient Medications on File Prior to Visit  Medication Sig Dispense Refill  . albuterol (VENTOLIN HFA) 108 (90 Base) MCG/ACT inhaler Inhale 2 puffs into the lungs every 6 (six) hours as needed for wheezing or shortness of  breath. 6.7 g 5  . apixaban (ELIQUIS) 5 MG TABS tablet Take 1 tablet (5 mg total) by mouth 2 (two) times daily. 60 tablet 5  . Ascorbic Acid (VITAMIN C) 100 MG tablet Take 100 mg by mouth daily.    . Cholecalciferol (VITAMIN D3) 1000 UNITS CAPS Take 1,000 Units by mouth daily.     . clotrimazole-betamethasone (LOTRISONE) cream Apply 1 application topically 2 (two) times daily. 30 g 0  . Coenzyme Q10 (CO Q 10 PO) Take by mouth daily.    Marland Kitchen dicyclomine (BENTYL) 20 MG tablet TAKE 1 TABLET BY MOUTH EVERY 6 HOURS AS NEEDED FOR SPASMS 360 tablet 1  . fexofenadine (ALLEGRA) 180 MG tablet Take 1 tablet (180 mg total) by mouth daily. 30 tablet 11  . fluticasone (FLONASE) 50 MCG/ACT nasal spray Place 2 sprays into both nostrils daily. 16 g 6  . furosemide (LASIX) 40 MG tablet TAKE 1 TABLET BY MOUTH EVERY DAY 90 tablet 0  . latanoprost (XALATAN) 0.005 % ophthalmic solution Place 1 drop into both eyes daily.      Marland Kitchen losartan (COZAAR) 25 MG tablet TAKE 1 TABLET (25 MG TOTAL) BY MOUTH DAILY. 90 tablet 3  . metoprolol tartrate (LOPRESSOR) 25 MG tablet TAKE 2 TABLETS EVERY MORNING AND 1 TABLET IN THE EVENING 270 tablet 1  . montelukast (SINGULAIR) 10 MG tablet TAKE 1 TABLET BY MOUTH EVERYDAY AT BEDTIME 90 tablet 1  . omeprazole (PRILOSEC) 20 MG capsule TAKE 1 CAPSULE BY MOUTH EVERY DAY 90 capsule 1  . rosuvastatin (CRESTOR) 10 MG  tablet TAKE 1 TABLET BY MOUTH EVERY DAY 90 tablet 3  . traMADol (ULTRAM) 50 MG tablet Take 1 tablet (50 mg total) by mouth every 6 (six) hours as needed. Dr.Ramos (Patient taking differently: Take 50 mg by mouth every 6 (six) hours as needed. Dr.Ramos; pt takes 1 tablet every night, maybe 1 during the day as needed) 30 tablet 0   No current facility-administered medications on file prior to visit.    Past Medical History:  Diagnosis Date  . Ankle fracture, left 11/12/2011  . Atrial fibrillation ([place])    Post op after repair of ankle fracture  . 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 CAD (coronary artery disease) 03/2015   LAD 80%, otherwise minimal CAD. s/p LIMA-LAD  . Diverticulitis 2002   based on CT scan  . Diverticulosis    Dr Henrene Pastor  . DVT (deep venous thrombosis) ([place]) 2000   no trigger  . Endometriosis   . Gastritis 2006   @ Endo  . GERD (gastroesophageal reflux disease)   . Hiatal hernia   . Hyperlipidemia   . Hypertension   . IBS (irritable bowel syndrome)   . Ocular hypertension    glaucoma suspect, Dr. Kathrin Penner  . Skin cancer   . Superficial phlebitis     X 2, 1999, 2000  . Syncope 11/12/11   in context CAP . Carotid Doppler exam was negative. 2D echocardiogram revealed mild dilation of the left atrium and moderate increase in the systolic pressureof  the pulmonary artery  . UTI (lower urinary tract infection) 01/2013   Strep bovis ; PCN sensitive    Past Surgical History:  Procedure Laterality Date  . CARDIAC CATHETERIZATION N/A 03/28/2015   Procedure: Left Heart Cath and Coronary Angiography;  Surgeon: Jolaine Artist, MD;  Location: [facility];  Service: Cardiovascular;  Laterality: N/A;  . CATARACT EXTRACTION Bilateral   . CATARACT EXTRACTION W/ INTRAOCULAR LENS IMPLANT     OS; Dr Kathrin Penner  . COLONOSCOPY  1995, 2002, 2012   diverticulosis; Dr Henrene Pastor  . CORONARY ARTERY BYPASS GRAFT N/A 04/01/2015   Procedure: CORONARY ARTERY BYPASS GRAFTING (CABG), ON PUMP, TIMES ONE, USING LEFT INTERNAL MAMMARY ARTERY;  Surgeon: Ivin Poot, MD;  Location: [facility];  Service: Open Heart Surgery;  Laterality: N/A;  . EXCISION OF ATRIAL MYXOMA N/A 04/01/2015   Procedure: EXCISION OF ATRIAL MYXOMA;  Surgeon: Ivin Poot, MD;  Location: [facility];  Service: Open Heart Surgery;  Laterality: N/A;  . ORIF ANKLE FRACTURE  11/14/2011   Procedure: OPEN REDUCTION INTERNAL FIXATION (ORIF) ANKLE FRACTURE;  Surgeon: Valerie Rhein;  [facility];  Service: Orthopedics;  Laterality: Left;  open reduction  internal fixation trimalleolar ankle fracture  . TEE WITHOUT CARDIOVERSION N/A 04/01/2015   Procedure: TRANSESOPHAGEAL ECHOCARDIOGRAM (TEE);  Surgeon: Ivin Poot, MD;  Location: [facility];  Service: Open Heart Surgery;  Laterality: N/A;  . TONSILLECTOMY    . TOTAL ABDOMINAL HYSTERECTOMY W/ BILATERAL SALPINGOOPHORECTOMY  1980   dysfunctional menses, endometriosis  . [place], 2009    Social History   Socioeconomic History  . Marital status: Married    Spouse name: Not on file  . Number of children: 2  . Years of education: Not on file  . Highest education level: Not on file  Occupational History  . Occupation: Retired    Fish farm manager: RETIRED  Tobacco  Use  . Smoking status: Never Smoker  . Smokeless tobacco: Never Used  Vaping Use  . Vaping Use: Never used  Substance and Sexual Activity  . Alcohol use: No  . Drug use: No  . Sexual activity: Not on file  Other Topics Concern  . Not on file  Social History Narrative   REG EXERCISE   HAD COLONOSCOPY AND ENDOSCOPY DONE DATES UNKNOWN   Social Determinants of Health   Financial Resource Strain:   . Difficulty of Paying Living Expenses: Not on file  Food Insecurity:   . Worried About Charity fundraiser in the Last Year: Not on file  . Ran Out of Food in the Last Year: Not on file  Transportation Needs:   . Lack of Transportation (Medical): Not on file  . Lack of Transportation (Non-Medical): Not on file  Physical Activity:   . Days of Exercise per Week: Not on file  . Minutes of Exercise per Session: Not on file  Stress:   . Feeling of Stress : Not on file  Social Connections:   . Frequency of Communication with Friends and Family: Not on file  . Frequency of Social Gatherings with Friends and Family: Not on file  . Attends Religious Services: Not on file  . Active Member of Clubs or Organizations: Not on file  . Attends Archivist Meetings: Not on file  . Marital Status: Not on file     Family History  Problem Relation Age of Onset  . Heart attack Father 21  . Alzheimer's disease Mother 46       TIAs  . Colon cancer Maternal Grandmother   . Heart attack Paternal Grandmother 82  . Hyperlipidemia Brother   . Hypertension Brother   . Diverticulitis Daughter        S/P colectomy  . Diabetes Neg Hx   . Stroke Neg Hx   . Hypercalcemia Neg Hx   . Stomach cancer Neg Hx   . Pancreatic cancer Neg Hx   . Esophageal cancer Neg Hx     Review of Systems  Constitutional: Negative for chills and fever.  Respiratory: Positive for cough (occ from tickle in throat). Negative for shortness of breath and wheezing.   Cardiovascular: Positive for leg swelling. Negative for chest pain and palpitations.  Skin: Positive for rash.  Neurological: Negative for light-headedness and headaches.       Objective:   Vitals:   08/01/20 1443  BP: 122/82  Pulse: 91  Temp: 98.3 F (36.8 C)  SpO2: 96%   BP Readings from Last 3 Encounters:  08/01/20 122/82  07/21/20 122/82  07/18/20 124/70   Wt Readings from Last 3 Encounters:  08/01/20 160 lb (72.6 kg)  07/21/20 163 lb 3.2 oz (74 kg)  07/18/20 163 lb 12.8 oz (74.3 kg)   Body mass index is 28.34 kg/m.   Physical Exam    Constitutional: Appears well-developed and well-nourished. No distress.  HENT:  Head: Normocephalic and atraumatic.  Neck: Neck supple. No tracheal deviation present. No thyromegaly present.  No cervical lymphadenopathy Cardiovascular: Normal rate, regular rhythm and normal heart sounds.   No murmur heard. No carotid bruit .  Chronic b/l LE edema - wears compression socks Pulmonary/Chest: Effort normal and breath sounds normal. No respiratory distress. No has no wheezes. No rales.  Skin: Skin is warm and dry. Not diaphoretic.  Psychiatric: Normal mood and affect. Behavior is normal.      Assessment & Plan:  See Problem List for Assessment and Plan of chronic medical problems.    This visit  occurred during the SARS-CoV-2 public health emergency.  Safety protocols were in place, including screening questions prior to the visit, additional usage of staff PPE, and extensive cleaning of exam room while observing appropriate contact time as indicated for disinfecting solutions.

## 2020-07-31 NOTE — Patient Instructions (Addendum)
  Blood work was ordered.    You had your prolia injection today.     Medications reviewed and updated.  Changes include :  none  Your prescription(s) have been submitted to your pharmacy. Please take as directed and contact our office if you believe you are having problem(s) with the medication(s).     Please followup in 6 months

## 2020-08-01 ENCOUNTER — Ambulatory Visit (INDEPENDENT_AMBULATORY_CARE_PROVIDER_SITE_OTHER): Payer: Medicare HMO | Admitting: Internal Medicine

## 2020-08-01 ENCOUNTER — Encounter: Payer: Self-pay | Admitting: Internal Medicine

## 2020-08-01 ENCOUNTER — Other Ambulatory Visit: Payer: Self-pay

## 2020-08-01 VITALS — BP 122/82 | HR 91 | Temp 98.3°F | Wt 160.0 lb

## 2020-08-01 DIAGNOSIS — M810 Age-related osteoporosis without current pathological fracture: Secondary | ICD-10-CM

## 2020-08-01 DIAGNOSIS — J301 Allergic rhinitis due to pollen: Secondary | ICD-10-CM

## 2020-08-01 DIAGNOSIS — M85852 Other specified disorders of bone density and structure, left thigh: Secondary | ICD-10-CM

## 2020-08-01 DIAGNOSIS — R600 Localized edema: Secondary | ICD-10-CM | POA: Diagnosis not present

## 2020-08-01 DIAGNOSIS — R7303 Prediabetes: Secondary | ICD-10-CM | POA: Diagnosis not present

## 2020-08-01 DIAGNOSIS — K219 Gastro-esophageal reflux disease without esophagitis: Secondary | ICD-10-CM

## 2020-08-01 DIAGNOSIS — M85851 Other specified disorders of bone density and structure, right thigh: Secondary | ICD-10-CM

## 2020-08-01 DIAGNOSIS — I4891 Unspecified atrial fibrillation: Secondary | ICD-10-CM

## 2020-08-01 DIAGNOSIS — E782 Mixed hyperlipidemia: Secondary | ICD-10-CM

## 2020-08-01 DIAGNOSIS — I10 Essential (primary) hypertension: Secondary | ICD-10-CM

## 2020-08-01 DIAGNOSIS — N1831 Chronic kidney disease, stage 3a: Secondary | ICD-10-CM

## 2020-08-01 MED ORDER — DENOSUMAB 60 MG/ML ~~LOC~~ SOSY
60.0000 mg | PREFILLED_SYRINGE | Freq: Once | SUBCUTANEOUS | Status: AC
  Administered 2020-08-01: 60 mg via SUBCUTANEOUS

## 2020-08-01 MED ORDER — APIXABAN 5 MG PO TABS: 5.0000 mg | ORAL_TABLET | Freq: Two times a day (BID) | ORAL | 0 refills | Status: DC

## 2020-08-01 NOTE — Assessment & Plan Note (Signed)
Chronic Asymptomatic Following with cardiology On Eliquis, metoprolol Check CBC, CMP, TSH

## 2020-08-01 NOTE — Assessment & Plan Note (Signed)
Chronic Check a1c Low sugar / carb diet Stressed regular exercise  

## 2020-08-01 NOTE — Assessment & Plan Note (Signed)
Chronic Check lipid panel  Continue daily statin Regular exercise and healthy diet encouraged  

## 2020-08-01 NOTE — Assessment & Plan Note (Signed)
Chronic GERD controlled Continue daily medication Omeprazole daily

## 2020-08-01 NOTE — Assessment & Plan Note (Addendum)
Chronic Taking Singulair, allegra and flonase daily With RAD - uses albuterol regularly Well controlled She does have dryness and wondered if that is contributing to her cough ?  Need all 3 of these medications-advised she can try holding the Singulair to see if she truly needs it or not  Using albuterol every day at this time-a maintenance medication would likely be more expensive so we will hold off for now, but will consider that once no longer in the donut hole

## 2020-08-01 NOTE — Telephone Encounter (Signed)
Pt call back she is aware of the deductible $250 for Prolia. Will give to her at office visit.Marland KitchenJohny Chess

## 2020-08-01 NOTE — Assessment & Plan Note (Signed)
Chronic BP well controlled Current regimen effective and well tolerated Continue current medications at current doses cmp  

## 2020-08-01 NOTE — Assessment & Plan Note (Addendum)
Chronic Related to venous insuff Uses compression socks Taking lasix daily-cardiology advised her she can take an extra Lasix if needed-okay to do cmp

## 2020-08-01 NOTE — Assessment & Plan Note (Signed)
Chronic Stable cmp 

## 2020-08-01 NOTE — Assessment & Plan Note (Signed)
Chronic Osteopenia with high FRAX On Prolia-injection today Stressed the importance of regular exercise, which she is not doing Continue vitamin D daily Taking 1 calcium daily-decreased secondary to hypercalcemia

## 2020-08-02 LAB — COMPLETE METABOLIC PANEL WITH GFR
AG Ratio: 2.1 (calc) (ref 1.0–2.5)
ALT: 28 U/L (ref 6–29)
AST: 28 U/L (ref 10–35)
Albumin: 4.7 g/dL (ref 3.6–5.1)
Alkaline phosphatase (APISO): 56 U/L (ref 37–153)
BUN/Creatinine Ratio: 25 (calc) — ABNORMAL HIGH (ref 6–22)
BUN: 23 mg/dL (ref 7–25)
CO2: 31 mmol/L (ref 20–32)
Calcium: 10.6 mg/dL — ABNORMAL HIGH (ref 8.6–10.4)
Chloride: 101 mmol/L (ref 98–110)
Creat: 0.93 mg/dL — ABNORMAL HIGH (ref 0.60–0.88)
GFR, Est African American: 67 mL/min/{1.73_m2} (ref >=60)
GFR, Est Non African American: 58 mL/min/{1.73_m2} — ABNORMAL LOW (ref >=60)
Globulin: 2.2 g/dL (calc) (ref 1.9–3.7)
Glucose, Bld: 93 mg/dL (ref 65–99)
Potassium: 4.9 mmol/L (ref 3.5–5.3)
Sodium: 142 mmol/L (ref 135–146)
Total Bilirubin: 0.8 mg/dL (ref 0.2–1.2)
Total Protein: 6.9 g/dL (ref 6.1–8.1)

## 2020-08-02 LAB — HEMOGLOBIN A1C
Hgb A1c MFr Bld: 5.7 % of total Hgb — ABNORMAL HIGH (ref <5.7)
Mean Plasma Glucose: 117 (calc)
eAG (mmol/L): 6.5 (calc)

## 2020-08-02 LAB — CBC WITH DIFFERENTIAL/PLATELET
Absolute Monocytes: 531 cells/uL (ref 200–950)
Basophils Absolute: 41 cells/uL (ref 0–200)
Basophils Relative: 0.7 %
Eosinophils Absolute: 89 cells/uL (ref 15–500)
Eosinophils Relative: 1.5 %
HCT: 43.4 % (ref 35.0–45.0)
Hemoglobin: 14.7 g/dL (ref 11.7–15.5)
Lymphs Abs: 1540 cells/uL (ref 850–3900)
MCH: 30.8 pg (ref 27.0–33.0)
MCHC: 33.9 g/dL (ref 32.0–36.0)
MCV: 90.8 fL (ref 80.0–100.0)
MPV: 10.4 fL (ref 7.5–12.5)
Monocytes Relative: 9 %
Neutro Abs: 3699 cells/uL (ref 1500–7800)
Neutrophils Relative %: 62.7 %
Platelets: 206 10*3/uL (ref 140–400)
RBC: 4.78 10*6/uL (ref 3.80–5.10)
RDW: 12.2 % (ref 11.0–15.0)
Total Lymphocyte: 26.1 %
WBC: 5.9 10*3/uL (ref 3.8–10.8)

## 2020-08-02 LAB — LIPID PANEL
Cholesterol: 167 mg/dL (ref <200)
HDL: 98 mg/dL (ref >=50)
LDL Cholesterol (Calc): 53 mg/dL (calc)
Non-HDL Cholesterol (Calc): 69 mg/dL (calc) (ref <130)
Total CHOL/HDL Ratio: 1.7 (calc) (ref <5.0)
Triglycerides: 80 mg/dL (ref <150)

## 2020-08-02 LAB — TSH: TSH: 2.34 mIU/L (ref 0.40–4.50)

## 2020-08-27 ENCOUNTER — Telehealth: Payer: Self-pay | Admitting: Internal Medicine

## 2020-08-27 NOTE — Telephone Encounter (Signed)
Please mail lab results from 9.10.21 appointment to [facility]:  Address: [address]  [place] [address]

## 2020-08-28 NOTE — Telephone Encounter (Signed)
Mailed out today

## 2020-09-06 ENCOUNTER — Ambulatory Visit (INDEPENDENT_AMBULATORY_CARE_PROVIDER_SITE_OTHER): Payer: Medicare HMO

## 2020-09-06 DIAGNOSIS — Z23 Encounter for immunization: Secondary | ICD-10-CM | POA: Diagnosis not present

## 2020-09-08 ENCOUNTER — Other Ambulatory Visit: Payer: Self-pay | Admitting: Internal Medicine

## 2020-09-08 DIAGNOSIS — R69 Illness, unspecified: Secondary | ICD-10-CM | POA: Diagnosis not present

## 2020-09-10 ENCOUNTER — Other Ambulatory Visit: Payer: Self-pay

## 2020-09-16 ENCOUNTER — Telehealth: Payer: Self-pay | Admitting: Internal Medicine

## 2020-09-16 ENCOUNTER — Other Ambulatory Visit: Payer: Self-pay

## 2020-09-16 MED ORDER — ALBUTEROL SULFATE HFA 108 (90 BASE) MCG/ACT IN AERS: 2.0000 | INHALATION_SPRAY | Freq: Four times a day (QID) | RESPIRATORY_TRACT | 5 refills | Status: DC | PRN

## 2020-09-16 NOTE — Telephone Encounter (Signed)
Sent in today 

## 2020-09-16 NOTE — Telephone Encounter (Signed)
albuterol (VENTOLIN HFA) 108 (90 Base) MCG/ACT inhaler [id number] - [place], [place] - [address] AT [address] [place] Phone:  [phone]  Fax:  [phone]     Requesting a refill

## 2020-09-29 ENCOUNTER — Other Ambulatory Visit: Payer: Self-pay | Admitting: Cardiology

## 2020-10-01 ENCOUNTER — Other Ambulatory Visit: Payer: Self-pay | Admitting: Internal Medicine

## 2020-10-03 DIAGNOSIS — I872 Venous insufficiency (chronic) (peripheral): Secondary | ICD-10-CM | POA: Diagnosis not present

## 2020-10-03 DIAGNOSIS — Z1283 Encounter for screening for malignant neoplasm of skin: Secondary | ICD-10-CM | POA: Diagnosis not present

## 2020-10-03 DIAGNOSIS — L821 Other seborrheic keratosis: Secondary | ICD-10-CM | POA: Diagnosis not present

## 2020-10-11 DIAGNOSIS — Z79891 Long term (current) use of opiate analgesic: Secondary | ICD-10-CM | POA: Insufficient documentation

## 2020-10-11 DIAGNOSIS — M4310 Spondylolisthesis, site unspecified: Secondary | ICD-10-CM | POA: Diagnosis not present

## 2020-10-11 DIAGNOSIS — M5136 Other intervertebral disc degeneration, lumbar region: Secondary | ICD-10-CM | POA: Diagnosis not present

## 2020-10-13 ENCOUNTER — Other Ambulatory Visit: Payer: Self-pay | Admitting: Cardiology

## 2020-10-13 DIAGNOSIS — Z1231 Encounter for screening mammogram for malignant neoplasm of breast: Secondary | ICD-10-CM | POA: Diagnosis not present

## 2020-10-13 LAB — HM MAMMOGRAPHY

## 2020-10-28 ENCOUNTER — Telehealth: Payer: Self-pay | Admitting: Internal Medicine

## 2020-10-28 NOTE — Progress Notes (Signed)
  Chronic Care Management   Note  10/28/2020 Name: Valerie Rollins MRN: [id number] DOB: 1936-05-26  Valerie Rollins is a 84 y.o. year old female who is a primary care patient of Burns, Claudina Lick, MD. I reached out to [name] by phone today in response to a referral sent by Valerie Rollins's PCP, Binnie Rail, MD.   [name] was given information about Chronic Care Management services today including:  1. CCM service includes personalized support from designated clinical staff supervised by her physician, including individualized plan of care and coordination with other care providers 2. 24/7 contact phone numbers for assistance for urgent and routine care needs. 3. Service will only be billed when office clinical staff spend 20 minutes or more in a month to coordinate care. 4. Only one practitioner may furnish and bill the service in a calendar month. 5. The patient may stop CCM services at any time (effective at the end of the month) by phone call to the office staff.   Patient agreed to services and verbal consent obtained.   Follow up plan:   Carley Perdue [employer] Scheduler

## 2020-10-30 ENCOUNTER — Encounter: Payer: Self-pay | Admitting: Internal Medicine

## 2020-10-30 NOTE — Progress Notes (Signed)
Outside notes received. Information abstracted. Notes sent to scan.  

## 2020-11-03 DIAGNOSIS — H02403 Unspecified ptosis of bilateral eyelids: Secondary | ICD-10-CM | POA: Diagnosis not present

## 2020-11-03 DIAGNOSIS — H04123 Dry eye syndrome of bilateral lacrimal glands: Secondary | ICD-10-CM | POA: Diagnosis not present

## 2020-11-03 DIAGNOSIS — Z961 Presence of intraocular lens: Secondary | ICD-10-CM | POA: Diagnosis not present

## 2020-11-03 DIAGNOSIS — H401131 Primary open-angle glaucoma, bilateral, mild stage: Secondary | ICD-10-CM | POA: Diagnosis not present

## 2020-11-04 ENCOUNTER — Ambulatory Visit: Payer: Medicare HMO | Admitting: Internal Medicine

## 2020-11-26 ENCOUNTER — Other Ambulatory Visit: Payer: Self-pay | Admitting: Internal Medicine

## 2020-12-08 ENCOUNTER — Ambulatory Visit: Payer: Medicare HMO | Admitting: Internal Medicine

## 2020-12-08 ENCOUNTER — Telehealth: Payer: Self-pay

## 2020-12-08 NOTE — Telephone Encounter (Signed)
Called Pharmacy to verify Rx was processed correctly. Per Pharmacy Refill submitted correct.

## 2020-12-12 ENCOUNTER — Telehealth: Payer: Self-pay | Admitting: Pharmacist

## 2020-12-12 NOTE — Progress Notes (Signed)
Chronic Care Management Pharmacy Assistant   Name: NESREEN ALBANO  MRN: [id number] DOB: 1936/08/27  Reason for Encounter: Initial Questions   PCP : Binnie Rail, MD  Allergies:   Allergies  Allergen Reactions  . Oxybutynin Chloride     Tongue swelling; Rx from Dr Hessie Diener  . Propoxyphene N-Acetaminophen     REACTION: made pt blood to thin  . Alendronate Sodium     ? reaction  . Ramipril     REACTION: cough    Medications: Outpatient Encounter Medications as of 12/12/2020  Medication Sig  . albuterol (VENTOLIN HFA) 108 (90 Base) MCG/ACT inhaler Inhale 2 puffs into the lungs every 6 (six) hours as needed for wheezing or shortness of breath.  Marland Kitchen apixaban (ELIQUIS) 5 MG TABS tablet Take 1 tablet (5 mg total) by mouth 2 (two) times daily.  . Ascorbic Acid (VITAMIN C) 100 MG tablet Take 100 mg by mouth daily.  . Cholecalciferol (VITAMIN D3) 1000 UNITS CAPS Take 1,000 Units by mouth daily.   . clotrimazole-betamethasone (LOTRISONE) cream Apply 1 application topically 2 (two) times daily.  . Coenzyme Q10 (CO Q 10 PO) Take by mouth daily.  Marland Kitchen dicyclomine (BENTYL) 20 MG tablet TAKE 1 TABLET BY MOUTH EVERY 6 HOURS AS NEEDED FOR SPASMS  . ELIQUIS 5 MG TABS tablet TAKE 1 TABLET BY MOUTH TWICE A DAY  . fexofenadine (ALLEGRA) 180 MG tablet Take 1 tablet (180 mg total) by mouth daily.  . fluticasone (FLONASE) 50 MCG/ACT nasal spray Place 2 sprays into both nostrils daily.  . furosemide (LASIX) 40 MG tablet TAKE 1 TABLET BY MOUTH EVERY DAY  . latanoprost (XALATAN) 0.005 % ophthalmic solution Place 1 drop into both eyes daily.    Marland Kitchen losartan (COZAAR) 25 MG tablet TAKE 1 TABLET (25 MG TOTAL) BY MOUTH DAILY.  . metoprolol tartrate (LOPRESSOR) 25 MG tablet TAKE 2 TABLETS EVERY MORNING AND 1 TABLET IN THE EVENING  . montelukast (SINGULAIR) 10 MG tablet TAKE 1 TABLET BY MOUTH EVERYDAY AT BEDTIME  . omeprazole (PRILOSEC) 20 MG capsule TAKE 1 CAPSULE BY MOUTH EVERY DAY  . rosuvastatin (CRESTOR) 10 MG  tablet TAKE 1 TABLET BY MOUTH EVERY DAY  . traMADol (ULTRAM) 50 MG tablet Take 1 tablet (50 mg total) by mouth every 6 (six) hours as needed. Dr.Ramos (Patient taking differently: Take 50 mg by mouth every 6 (six) hours as needed. Dr.Ramos; pt takes 1 tablet every night, maybe 1 during the day as needed)   No facility-administered encounter medications on file as of 12/12/2020.    Current Diagnosis: Patient Active Problem List   Diagnosis Date Noted  . Chronic venous stasis dermatitis of both lower extremities 01/21/2020  . Hypercalcemia 07/19/2019  . CKD (chronic kidney disease) stage 3, GFR 30-59 ml/min 07/19/2019  . Candidal intertrigo 04/20/2018  . Angular stomatitis 04/20/2018  . Seasonal allergic rhinitis due to pollen 03/21/2018  . Bilateral leg edema 12/31/2016  . Prediabetes 01/10/2016  . Osteopenia 12/26/2015  . Mass of neck 07/07/2015  . Long-term (current) use of anticoagulants 04/16/2015  . S/P CABG x 1 04/01/2015  . CAD (coronary artery disease) 03/28/2015  . 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 Cardiomegaly 02/20/2015  . DDD (degenerative disc disease), lumbar 07/20/2012  . Atrial fibrillation ([place]) 11/15/2011  . Syncope 11/12/2011  . RBBB (right bundle branch block) 11/12/2011  . SKIN CANCER, HX OF 11/18/2009  . Diverticulosis of large intestine 10/31/2008  . VARICOSE VEINS, LOWER EXTREMITIES 03/13/2008  . GERD  02/20/2008  . IBS 02/20/2008  . Osteoarthritis 02/20/2008  . Hyperlipidemia 12/01/2006  . Essential hypertension 12/01/2006  . SUPERFICIAL THROMBOPHLEBITIS 12/01/2006  . DVT, HX OF 12/01/2006    Goals Addressed   None     Follow-Up:  Pharmacist Review   Have you seen any other providers since your last visit? The patient states that she has not seen any other providers   Any changes in your medications or health? The patient states that she has not had any medication or health changes  Any side effects from any medications? The patient states that she  does not have any side effects from medications  Do you have an symptoms or problems not managed by your medications? The patient states that she does not have any symptoms or problems not managed by medications  Any concerns about your health right now? The patient states that she does not have any concerns abot her health at this time  Has your provider asked that you check blood pressure, blood sugar, or follow special diet at home? The patient states that she usually gets blood checked at doctor's office and she also states that she try's to follow a low salt diet  Do you get any type of exercise on a regular basis? The patient states that she does not exercise  Can you think of a goal you would like to reach for your health? The patient states that she cannot think of a goal for her health at this time  Do you have any problems getting your medications? The patient states that she does not have any problems with getting her medications from the pharmacy  Is there anything that you would like to discuss during the appointment? The patient states that she does not have anything specific to discuss at this time.    Please bring medications and supplements to appointment   Wendy Poet, [place] [phone]

## 2020-12-15 ENCOUNTER — Ambulatory Visit: Payer: Medicare HMO | Admitting: Pharmacist

## 2020-12-15 ENCOUNTER — Other Ambulatory Visit: Payer: Self-pay

## 2020-12-15 NOTE — Patient Instructions (Addendum)
Visit Information  Phone number for Pharmacist: [phone]  Thank you for meeting with me to discuss your medications! I look forward to working with you to achieve your health care goals. Below is a summary of what we talked about during the visit:  Goals Addressed            This Visit's Progress   . Manage My Diet-Irritable Bowel Syndrome       Timeframe:  Long-Range Goal Priority:  High Start Date:    12/15/20                         Expected End Date:      01/15/21      - avoid foods that make my symptoms worse - eat 5 or 6 small meals each day - eat meals at regular times - take time to eat - follow dietitian advice about fiber and probiotics - limit fizzy (carbonated) drinks - read food label for sorbitol and avoid these foods    Why is this important?    Changing what you eat and drink may help you to manage your condition.   It is important to take it slow, making 1 change at a time.   A dietitian is the best person to guide you.     . Manage My Medicine       Timeframe:  Long-Range Goal Priority:  Medium Start Date:    12/15/20                         Expected End Date:      01/15/21                 - call for medicine refill 2 or 3 days before it runs out - call if I am sick and can't take my medicine - keep a list of all the medicines I take; vitamins and herbals too - use a pillbox to sort medicine    Why is this important?   . These steps will help you keep on track with your medicines.    . Prevent Falls and Broken Bones-Osteoporosis       Timeframe:  Long-Range Goal Priority:  Medium Start Date:    12/15/20                         Expected End Date:     01/15/21                   - always wear shoes or slippers with non-slip sole - get at least 10 minutes of activity every day - keep a flashlight by the bed - keep cell phone with me always - make an emergency alert plan in case I fall - pick up clutter from the floors - use a nightlight in the  bathroom - wear low heeled or flat shoes with non-skid soles    Why is this important?    When you fall, there are 3 things that control if a bone breaks or not.   These are the fall itself, how hard and the direction that you fall and how fragile your bones are.   Preventing falls is very important for you because of fragile bones.        Patient Care Plan: CCM Pharmacy Care Plan    Problem Identified: Afib, CAD, HTN, HLD, Osteopenia, Osteoarthritis  Priority: High    Goal: Patient-Specific Goal   Start Date: 12/15/2020  Expected End Date: 01/15/2021  This Visit's Progress: On track  Priority: High  Note:   Current Barriers:  . Unable to independently monitor therapeutic efficacy . Suboptimal therapeutic regimen for allergies and GERD  Pharmacist Clinical Goal(s):  Marland Kitchen Over the next 30 days, patient will achieve adherence to monitoring guidelines and medication adherence to achieve therapeutic efficacy . adhere to plan to optimize therapeutic regimen for allergies and GERD as evidenced by report of adherence to recommended medication management changes through collaboration with PharmD and provider.   Interventions: . 1:1 collaboration with Binnie Rail, MD regarding development and update of comprehensive plan of care as evidenced by provider attestation and co-signature . Inter-disciplinary care team collaboration (see longitudinal plan of care) . Comprehensive medication review performed; medication list updated in electronic medical record  Hypertension (BP goal <130/80) -controlled -Current treatment: . Losartan 25 mg daily AM . Metoprolol tartrate 25 mg - 2 tab AM, 1 tab PM . Furosemide 40 mg daily -Current home readings: n/a -Current dietary habits: tries to avoid salt -Current exercise habits: limited -Denies hypotensive/hypertensive symptoms -wearing compression socks most days -Educated on BP goals and benefits of medications for prevention of heart attack,  stroke and kidney damage; Daily salt intake goal < 2300 mg; Importance of home blood pressure monitoring; Symptoms of hypotension and importance of maintaining adequate hydration; -Counseled to monitor BP at home weekly, document, and provide log at future appointments -Recommended to continue current medication  Hyperlipidemia / CAD: (LDL goal < 70) -controlled -Current treatment: . Rosuvastatin 10 mg daily PM . Coenzyme Q10 -Educated on Cholesterol goals;  Benefits of statin for ASCVD risk reduction; benefits of CoQ10 are limited to treating side effects of statins - if pt does not have side effects, there is no need to take this -Recommended to continue current medication Recommended to stop Coenzyme Q10 if no statin side effects  Atrial Fibrillation (Goal: prevent stroke and major bleeding) -controlled  -CHADSVASC = 5 -Current treatment: . Rate control: metoprolol tartrate 25 mg - 2 tab AM, 1 tab PM . Anticoagulation: Eliquis 5 mg BID -Home BP and HR readings: n/a -occasional nosebleed  -Counseled on increased risk of stroke due to Afib and benefits of anticoagulation for stroke prevention; importance of adherence to anticoagulant exactly as prescribed; bleeding risk associated with Eliquis and importance of self-monitoring for signs/symptoms of bleeding; avoidance of NSAIDs due to increased bleeding risk with anticoagulants; -Recommended to continue current medication   Osteopenia (Goal: prevent fractures) -controlled -Last DEXA Scan: 02/25/2020  T-Score femoral neck: -1.7  T-Score lumbar spine: +2.1  10-year probability of major osteoporotic fracture: 20%  10-year probability of hip fracture: 5% -Patient is a candidate for pharmacologic treatment due to T-Score -1.0 to -2.5 and 10-year risk of hip fracture > 3% -Current treatment  . Prolia 60 mg q6 months - last given 08/01/20 . Vitamin D 1000 IU daily . Calcium 400 mg - 1 tab AM, 2 tab PM (pt has been taking it this way  for at least 6 months) -Counseled on history of hypercalcemia; pt has seen endocrine for this previously and has been advised to take less than the recommended amount of calcium due to this -Recommend [phone] units of vitamin D daily.   -Recommended to reduce calcium supplement to 1 tablet twice a day and follow up with PCP to recheck calcium levels  Osteoarthritis / DDD (Goal: mange symptoms) -controlled -  pt does not use any OTC pain relievers -Current treatment  . Tramadol 50 mg q6h PRN (Dr Nelva Bush) -Medications previously tried: n/a  -Counseled on using as little pain medication as possible -Recommended to continue current medication  GERD / IBS (Goal: manage symptoms) -controlled -Current treatment  . Omeprazole 20 mg daily . Dicyclomine 20 mg q6h PRN - typically 3 times a day . Probiotic ([employer] brand) -Educated on benefits and potential side effects of dicyclomine; pt reports occasional dry moth but otherwise tolerates well -Recommended trial off of omeprazole to determine if it is still needed -Recommended to continue current medication  Allergic rhinitis (Goal: manage symptoms) -controlled -Current treatment  . Fexofenadine 180 mg daily  . Fluticasone nasal spray PRN - daily . Montelukast 10 mg HS . Albuterol HFA prn -Counseled on polypharmacy; it is unclear if she is getting benefit from all medications -Recommended trial off of montelukast to determine if medication is helping  Health Maintenance -Vaccine gaps: Covid booster -Current therapy:   Marland Kitchen Vitamin C 100 mg daily . Benzoyl peroxide gel daily . Betamethasone 0.05% cream . Latanoprost 0.005% eye drops . Artificial tears -Patient is satisfied with current therapy and denies issues -Recommended to continue current medication  Patient Goals/Self-Care Activities . Over the next 30 days, patient will:  - take medications as prescribed -trial off of omeprazole and montelukast to determine benefit  Follow Up  Plan: Telephone follow up appointment with care management team member scheduled for: 1 month     Ms. Goonan was given information about Chronic Care Management services today including:  1. CCM service includes personalized support from designated clinical staff supervised by her physician, including individualized plan of care and coordination with other care providers 2. 24/7 contact phone numbers for assistance for urgent and routine care needs. 3. Standard insurance, coinsurance, copays and deductibles apply for chronic care management only during months in which we provide at least 20 minutes of these services. Most insurances cover these services at 100%, however patients may be responsible for any copay, coinsurance and/or deductible if applicable. This service may help you avoid the need for more expensive face-to-face services. 4. Only one practitioner may furnish and bill the service in a calendar month. 5. The patient may stop CCM services at any time (effective at the end of the month) by phone call to the office staff.  Patient agreed to services and verbal consent obtained.   The patient verbalized understanding of instructions, educational materials, and care plan provided today and agreed to receive a mailed copy of patient instructions, educational materials, and care plan.  Telephone follow up appointment with pharmacy team member scheduled for: 1 month  Charlene Brooke, PharmD, BCACP Clinical Pharmacist [facility] Maintenance for Postmenopausal Women Menopause is a normal process in which your ability to get pregnant comes to an end. This process happens slowly over many months or years, usually between the ages of 68 and 59. Menopause is complete when you have missed your menstrual periods for 12 months. It is important to talk with your health care provider about some of the most common conditions that affect women after  menopause (postmenopausal women). These include heart disease, cancer, and bone loss (osteoporosis). Adopting a healthy lifestyle and getting preventive care can help to promote your health and wellness. The actions you take can also lower your chances of developing some of these common conditions. What should I know about menopause? During menopause, you  may get a number of symptoms, such as:  Hot flashes. These can be moderate or severe.  Night sweats.  Decrease in sex drive.  Mood swings.  Headaches.  Tiredness.  Irritability.  Memory problems.  Insomnia. Choosing to treat or not to treat these symptoms is a decision that you make with your health care provider. Do I need hormone replacement therapy?  Hormone replacement therapy is effective in treating symptoms that are caused by menopause, such as hot flashes and night sweats.  Hormone replacement carries certain risks, especially as you become older. If you are thinking about using estrogen or estrogen with progestin, discuss the benefits and risks with your health care provider. What is my risk for heart disease and stroke? The risk of heart disease, heart attack, and stroke increases as you age. One of the causes may be a change in the body's hormones during menopause. This can affect how your body uses dietary fats, triglycerides, and cholesterol. Heart attack and stroke are medical emergencies. There are many things that you can do to help prevent heart disease and stroke. Watch your blood pressure  High blood pressure causes heart disease and increases the risk of stroke. This is more likely to develop in people who have high blood pressure readings, are of African descent, or are overweight.  Have your blood pressure checked: ? Every 3-5 years if you are 9-86 years of age. ? Every year if you are 64 years old or older. Eat a healthy diet  Eat a diet that includes plenty of vegetables, fruits, low-fat dairy products,  and lean protein.  Do not eat a lot of foods that are high in solid fats, added sugars, or sodium.   Get regular exercise Get regular exercise. This is one of the most important things you can do for your health. Most adults should:  Try to exercise for at least 150 minutes each week. The exercise should increase your heart rate and make you sweat (moderate-intensity exercise).  Try to do strengthening exercises at least twice each week. Do these in addition to the moderate-intensity exercise.  Spend less time sitting. Even light physical activity can be beneficial. Other tips  Work with your health care provider to achieve or maintain a healthy weight.  Do not use any products that contain nicotine or tobacco, such as cigarettes, e-cigarettes, and chewing tobacco. If you need help quitting, ask your health care provider.  Know your numbers. Ask your health care provider to check your cholesterol and your blood sugar (glucose). Continue to have your blood tested as directed by your health care provider. Do I need screening for cancer? Depending on your health history and family history, you may need to have cancer screening at different stages of your life. This may include screening for:  Breast cancer.  Cervical cancer.  Lung cancer.  Colorectal cancer. What is my risk for osteoporosis? After menopause, you may be at increased risk for osteoporosis. Osteoporosis is a condition in which bone destruction happens more quickly than new bone creation. To help prevent osteoporosis or the bone fractures that can happen because of osteoporosis, you may take the following actions:  If you are 29-37 years old, get at least 1,000 mg of calcium and at least 600 mg of vitamin D per day.  If you are older than age 49 but younger than age 43, get at least 1,200 mg of calcium and at least 600 mg of vitamin D per day.  If you  are older than age 18, get at least 1,200 mg of calcium and at least  800 mg of vitamin D per day. Smoking and drinking excessive alcohol increase the risk of osteoporosis. Eat foods that are rich in calcium and vitamin D, and do weight-bearing exercises several times each week as directed by your health care provider. How does menopause affect my mental health? Depression may occur at any age, but it is more common as you become older. Common symptoms of depression include:  Low or sad mood.  Changes in sleep patterns.  Changes in appetite or eating patterns.  Feeling an overall lack of motivation or enjoyment of activities that you previously enjoyed.  Frequent crying spells. Talk with your health care provider if you think that you are experiencing depression. General instructions See your health care provider for regular wellness exams and vaccines. This may include:  Scheduling regular health, dental, and eye exams.  Getting and maintaining your vaccines. These include: ? Influenza vaccine. Get this vaccine each year before the flu season begins. ? Pneumonia vaccine. ? Shingles vaccine. ? Tetanus, diphtheria, and pertussis (Tdap) booster vaccine. Your health care provider may also recommend other immunizations. Tell your health care provider if you have ever been abused or do not feel safe at home. Summary  Menopause is a normal process in which your ability to get pregnant comes to an end.  This condition causes hot flashes, night sweats, decreased interest in sex, mood swings, headaches, or lack of sleep.  Treatment for this condition may include hormone replacement therapy.  Take actions to keep yourself healthy, including exercising regularly, eating a healthy diet, watching your weight, and checking your blood pressure and blood sugar levels.  Get screened for cancer and depression. Make sure that you are up to date with all your vaccines. This information is not intended to replace advice given to you by your health care provider. Make  sure you discuss any questions you have with your health care provider. Document Revised: 11/01/2018 Document Reviewed: 11/01/2018 Elsevier Patient Education  2021 [employer].

## 2020-12-15 NOTE — Chronic Care Management (AMB) (Signed)
 Chronic Care Management Pharmacy Note  12/15/2020 Name:  Valerie Rollins MRN:  [id number] DOB:  07/29/1936  Subjective: Valerie Rollins is an 85 y.o. year old female who is a primary patient of Burns, Stacy J, MD.  The CCM team was consulted for assistance with disease management and care coordination needs.    Engaged with patient by telephone for initial visit in response to provider referral for pharmacy case management and/or care coordination services.   Consent to Services:  The patient was given the following information about Chronic Care Management services today, agreed to services, and gave verbal consent: 1. CCM service includes personalized support from designated clinical staff supervised by the primary care provider, including individualized plan of care and coordination with other care providers 2. 24/7 contact phone numbers for assistance for urgent and routine care needs. 3. Service will only be billed when office clinical staff spend 20 minutes or more in a month to coordinate care. 4. Only one practitioner may furnish and bill the service in a calendar month. 5.The patient may stop CCM services at any time (effective at the end of the month) by phone call to the office staff. 6. The patient will be responsible for cost sharing (co-pay) of up to 20% of the service fee (after annual deductible is met). Patient agreed to services and consent obtained.  Patient Care Team: Burns, Stacy J, MD as PCP - General (Internal Medicine) Crenshaw, Brian S, MD as PCP - Cardiology (Cardiology) ,  N, RPH as Pharmacist (Pharmacist)  Recent office visits: 08/01/20 Dr Burns OV: chronic f/u. Try holding singulair to see if it is truly benefiting her. Consider maintenance inhaler once out of donut hole  06/26/20 Dr Burns OV: acute visit for rash. rx'd lotrisone BID.  Recent consult visits: 10/11/20 Dr Ramos ([facility]): f/u for lumbar DDD 10/03/20 Dr Hall (dermatology): skin cancer  screening 07/21/20 Dr Crenshaw (cardiology): f/u Afib, CAD. Peripheral edema - continue lasix, may take extra 20 mg PRN  07/18/20 Dr Ellison (endocrine): f/u for hypercalcemia, resolved off of Ca supplement. No med changes. F/u prn  Objective:  Lab Results  Component Value Date   CREATININE 0.93 (H) 08/01/2020   BUN 23 08/01/2020   GFR 51.42 (L) 08/17/2019   GFRNONAA 58 (L) 08/01/2020   GFRAA 67 08/01/2020   NA 142 08/01/2020   K 4.9 08/01/2020   CALCIUM 10.6 (H) 08/01/2020   CO2 31 08/01/2020   Lab Results  Component Value Date/Time   HGBA1C 5.7 (H) 08/01/2020 03:50 PM   HGBA1C 5.9 07/16/2019 03:04 PM   GFR 51.42 (L) 08/17/2019 01:05 PM   GFR 48.18 (L) 07/16/2019 03:04 PM    Last diabetic Eye exam: No results found for: HMDIABEYEEXA  Last diabetic Foot exam: No results found for: HMDIABFOOTEX   Lab Results  Component Value Date   CHOL 167 08/01/2020   HDL 98 08/01/2020   LDLCALC 53 08/01/2020   LDLDIRECT 152.6 07/31/2013   TRIG 80 08/01/2020   CHOLHDL 1.7 08/01/2020   Hepatic Function Latest Ref Rng & Units 08/01/2020 07/16/2019 12/26/2018  Total Protein 6.1 - 8.1 g/dL 6.9 7.4 6.5  Albumin 3.5 - 5.2 g/dL - 5.0 4.5  AST 10 - 35 U/L 28 27 35  ALT 6 - 29 U/L 28 19 29  Alk Phosphatase 39 - 117 U/L - 56 55  Total Bilirubin 0.2 - 1.2 mg/dL 0.8 0.8 0.7  Bilirubin, Direct 0.00 - 0.40 mg/dL - - -       Chronic Care Management Pharmacy Note  12/15/2020 Name:  Valerie Rollins MRN:  [id number] DOB:  07/29/1936  Subjective: Valerie Rollins is an 85 y.o. year old female who is a primary patient of Burns, Stacy J, MD.  The CCM team was consulted for assistance with disease management and care coordination needs.    Engaged with patient by telephone for initial visit in response to provider referral for pharmacy case management and/or care coordination services.   Consent to Services:  The patient was given the following information about Chronic Care Management services today, agreed to services, and gave verbal consent: 1. CCM service includes personalized support from designated clinical staff supervised by the primary care provider, including individualized plan of care and coordination with other care providers 2. 24/7 contact phone numbers for assistance for urgent and routine care needs. 3. Service will only be billed when office clinical staff spend 20 minutes or more in a month to coordinate care. 4. Only one practitioner may furnish and bill the service in a calendar month. 5.The patient may stop CCM services at any time (effective at the end of the month) by phone call to the office staff. 6. The patient will be responsible for cost sharing (co-pay) of up to 20% of the service fee (after annual deductible is met). Patient agreed to services and consent obtained.  Patient Care Team: Burns, Stacy J, MD as PCP - General (Internal Medicine) Crenshaw, Brian S, MD as PCP - Cardiology (Cardiology) Aanya Haynes N, RPH as Pharmacist (Pharmacist)  Recent office visits: 08/01/20 Dr Burns OV: chronic f/u. Try holding singulair to see if it is truly benefiting her. Consider maintenance inhaler once out of donut hole  06/26/20 Dr Burns OV: acute visit for rash. rx'd lotrisone BID.  Recent consult visits: 10/11/20 Dr Ramos ([facility]): f/u for lumbar DDD 10/03/20 Dr Hall (dermatology): skin cancer  screening 07/21/20 Dr Crenshaw (cardiology): f/u Afib, CAD. Peripheral edema - continue lasix, may take extra 20 mg PRN  07/18/20 Dr Ellison (endocrine): f/u for hypercalcemia, resolved off of Ca supplement. No med changes. F/u prn  Objective:  Lab Results  Component Value Date   CREATININE 0.93 (H) 08/01/2020   BUN 23 08/01/2020   GFR 51.42 (L) 08/17/2019   GFRNONAA 58 (L) 08/01/2020   GFRAA 67 08/01/2020   NA 142 08/01/2020   K 4.9 08/01/2020   CALCIUM 10.6 (H) 08/01/2020   CO2 31 08/01/2020   Lab Results  Component Value Date/Time   HGBA1C 5.7 (H) 08/01/2020 03:50 PM   HGBA1C 5.9 07/16/2019 03:04 PM   GFR 51.42 (L) 08/17/2019 01:05 PM   GFR 48.18 (L) 07/16/2019 03:04 PM    Last diabetic Eye exam: No results found for: HMDIABEYEEXA  Last diabetic Foot exam: No results found for: HMDIABFOOTEX   Lab Results  Component Value Date   CHOL 167 08/01/2020   HDL 98 08/01/2020   LDLCALC 53 08/01/2020   LDLDIRECT 152.6 07/31/2013   TRIG 80 08/01/2020   CHOLHDL 1.7 08/01/2020   Hepatic Function Latest Ref Rng & Units 08/01/2020 07/16/2019 12/26/2018  Total Protein 6.1 - 8.1 g/dL 6.9 7.4 6.5  Albumin 3.5 - 5.2 g/dL - 5.0 4.5  AST 10 - 35 U/L 28 27 35  ALT 6 - 29 U/L 28 19 29  Alk Phosphatase 39 - 117 U/L - 56 55  Total Bilirubin 0.2 - 1.2 mg/dL 0.8 0.8 0.7  Bilirubin, Direct 0.00 - 0.40 mg/dL - - -       Chronic Care Management Pharmacy Note  12/15/2020 Name:  Valerie Rollins MRN:  [id number] DOB:  07/29/1936  Subjective: Valerie Rollins is an 85 y.o. year old female who is a primary patient of Burns, Stacy J, MD.  The CCM team was consulted for assistance with disease management and care coordination needs.    Engaged with patient by telephone for initial visit in response to provider referral for pharmacy case management and/or care coordination services.   Consent to Services:  The patient was given the following information about Chronic Care Management services today, agreed to services, and gave verbal consent: 1. CCM service includes personalized support from designated clinical staff supervised by the primary care provider, including individualized plan of care and coordination with other care providers 2. 24/7 contact phone numbers for assistance for urgent and routine care needs. 3. Service will only be billed when office clinical staff spend 20 minutes or more in a month to coordinate care. 4. Only one practitioner may furnish and bill the service in a calendar month. 5.The patient may stop CCM services at any time (effective at the end of the month) by phone call to the office staff. 6. The patient will be responsible for cost sharing (co-pay) of up to 20% of the service fee (after annual deductible is met). Patient agreed to services and consent obtained.  Patient Care Team: Burns, Stacy J, MD as PCP - General (Internal Medicine) Crenshaw, Brian S, MD as PCP - Cardiology (Cardiology) Jadia Capers N, RPH as Pharmacist (Pharmacist)  Recent office visits: 08/01/20 Dr Burns OV: chronic f/u. Try holding singulair to see if it is truly benefiting her. Consider maintenance inhaler once out of donut hole  06/26/20 Dr Burns OV: acute visit for rash. rx'd lotrisone BID.  Recent consult visits: 10/11/20 Dr Ramos ([facility]): f/u for lumbar DDD 10/03/20 Dr Hall (dermatology): skin cancer  screening 07/21/20 Dr Crenshaw (cardiology): f/u Afib, CAD. Peripheral edema - continue lasix, may take extra 20 mg PRN  07/18/20 Dr Ellison (endocrine): f/u for hypercalcemia, resolved off of Ca supplement. No med changes. F/u prn  Objective:  Lab Results  Component Value Date   CREATININE 0.93 (H) 08/01/2020   BUN 23 08/01/2020   GFR 51.42 (L) 08/17/2019   GFRNONAA 58 (L) 08/01/2020   GFRAA 67 08/01/2020   NA 142 08/01/2020   K 4.9 08/01/2020   CALCIUM 10.6 (H) 08/01/2020   CO2 31 08/01/2020   Lab Results  Component Value Date/Time   HGBA1C 5.7 (H) 08/01/2020 03:50 PM   HGBA1C 5.9 07/16/2019 03:04 PM   GFR 51.42 (L) 08/17/2019 01:05 PM   GFR 48.18 (L) 07/16/2019 03:04 PM    Last diabetic Eye exam: No results found for: HMDIABEYEEXA  Last diabetic Foot exam: No results found for: HMDIABFOOTEX   Lab Results  Component Value Date   CHOL 167 08/01/2020   HDL 98 08/01/2020   LDLCALC 53 08/01/2020   LDLDIRECT 152.6 07/31/2013   TRIG 80 08/01/2020   CHOLHDL 1.7 08/01/2020   Hepatic Function Latest Ref Rng & Units 08/01/2020 07/16/2019 12/26/2018  Total Protein 6.1 - 8.1 g/dL 6.9 7.4 6.5  Albumin 3.5 - 5.2 g/dL - 5.0 4.5  AST 10 - 35 U/L 28 27 35  ALT 6 - 29 U/L 28 19 29  Alk Phosphatase 39 - 117 U/L - 56 55  Total Bilirubin 0.2 - 1.2 mg/dL 0.8 0.8 0.7  Bilirubin, Direct 0.00 - 0.40 mg/dL - - -       Chronic Care Management Pharmacy Note  12/15/2020 Name:  Valerie Rollins MRN:  [id number] DOB:  07/29/1936  Subjective: Valerie Rollins is an 85 y.o. year old female who is a primary patient of Burns, Stacy J, MD.  The CCM team was consulted for assistance with disease management and care coordination needs.    Engaged with patient by telephone for initial visit in response to provider referral for pharmacy case management and/or care coordination services.   Consent to Services:  The patient was given the following information about Chronic Care Management services today, agreed to services, and gave verbal consent: 1. CCM service includes personalized support from designated clinical staff supervised by the primary care provider, including individualized plan of care and coordination with other care providers 2. 24/7 contact phone numbers for assistance for urgent and routine care needs. 3. Service will only be billed when office clinical staff spend 20 minutes or more in a month to coordinate care. 4. Only one practitioner may furnish and bill the service in a calendar month. 5.The patient may stop CCM services at any time (effective at the end of the month) by phone call to the office staff. 6. The patient will be responsible for cost sharing (co-pay) of up to 20% of the service fee (after annual deductible is met). Patient agreed to services and consent obtained.  Patient Care Team: Burns, Stacy J, MD as PCP - General (Internal Medicine) Crenshaw, Brian S, MD as PCP - Cardiology (Cardiology) ,  N, RPH as Pharmacist (Pharmacist)  Recent office visits: 08/01/20 Dr Burns OV: chronic f/u. Try holding singulair to see if it is truly benefiting her. Consider maintenance inhaler once out of donut hole  06/26/20 Dr Burns OV: acute visit for rash. rx'd lotrisone BID.  Recent consult visits: 10/11/20 Dr Ramos ([facility]): f/u for lumbar DDD 10/03/20 Dr Hall (dermatology): skin cancer  screening 07/21/20 Dr Crenshaw (cardiology): f/u Afib, CAD. Peripheral edema - continue lasix, may take extra 20 mg PRN  07/18/20 Dr Ellison (endocrine): f/u for hypercalcemia, resolved off of Ca supplement. No med changes. F/u prn  Objective:  Lab Results  Component Value Date   CREATININE 0.93 (H) 08/01/2020   BUN 23 08/01/2020   GFR 51.42 (L) 08/17/2019   GFRNONAA 58 (L) 08/01/2020   GFRAA 67 08/01/2020   NA 142 08/01/2020   K 4.9 08/01/2020   CALCIUM 10.6 (H) 08/01/2020   CO2 31 08/01/2020   Lab Results  Component Value Date/Time   HGBA1C 5.7 (H) 08/01/2020 03:50 PM   HGBA1C 5.9 07/16/2019 03:04 PM   GFR 51.42 (L) 08/17/2019 01:05 PM   GFR 48.18 (L) 07/16/2019 03:04 PM    Last diabetic Eye exam: No results found for: HMDIABEYEEXA  Last diabetic Foot exam: No results found for: HMDIABFOOTEX   Lab Results  Component Value Date   CHOL 167 08/01/2020   HDL 98 08/01/2020   LDLCALC 53 08/01/2020   LDLDIRECT 152.6 07/31/2013   TRIG 80 08/01/2020   CHOLHDL 1.7 08/01/2020   Hepatic Function Latest Ref Rng & Units 08/01/2020 07/16/2019 12/26/2018  Total Protein 6.1 - 8.1 g/dL 6.9 7.4 6.5  Albumin 3.5 - 5.2 g/dL - 5.0 4.5  AST 10 - 35 U/L 28 27 35  ALT 6 - 29 U/L 28 19 29  Alk Phosphatase 39 - 117 U/L - 56 55  Total Bilirubin 0.2 - 1.2 mg/dL 0.8 0.8 0.7  Bilirubin, Direct 0.00 - 0.40 mg/dL - - -       Chronic Care Management Pharmacy Note  12/15/2020 Name:  Valerie Rollins MRN:  [id number] DOB:  07/29/1936  Subjective: Valerie Rollins is an 85 y.o. year old female who is a primary patient of Burns, Stacy J, MD.  The CCM team was consulted for assistance with disease management and care coordination needs.    Engaged with patient by telephone for initial visit in response to provider referral for pharmacy case management and/or care coordination services.   Consent to Services:  The patient was given the following information about Chronic Care Management services today, agreed to services, and gave verbal consent: 1. CCM service includes personalized support from designated clinical staff supervised by the primary care provider, including individualized plan of care and coordination with other care providers 2. 24/7 contact phone numbers for assistance for urgent and routine care needs. 3. Service will only be billed when office clinical staff spend 20 minutes or more in a month to coordinate care. 4. Only one practitioner may furnish and bill the service in a calendar month. 5.The patient may stop CCM services at any time (effective at the end of the month) by phone call to the office staff. 6. The patient will be responsible for cost sharing (co-pay) of up to 20% of the service fee (after annual deductible is met). Patient agreed to services and consent obtained.  Patient Care Team: Burns, Stacy J, MD as PCP - General (Internal Medicine) Crenshaw, Brian S, MD as PCP - Cardiology (Cardiology) ,  N, RPH as Pharmacist (Pharmacist)  Recent office visits: 08/01/20 Dr Burns OV: chronic f/u. Try holding singulair to see if it is truly benefiting her. Consider maintenance inhaler once out of donut hole  06/26/20 Dr Burns OV: acute visit for rash. rx'd lotrisone BID.  Recent consult visits: 10/11/20 Dr Ramos ([facility]): f/u for lumbar DDD 10/03/20 Dr Hall (dermatology): skin cancer  screening 07/21/20 Dr Crenshaw (cardiology): f/u Afib, CAD. Peripheral edema - continue lasix, may take extra 20 mg PRN  07/18/20 Dr Ellison (endocrine): f/u for hypercalcemia, resolved off of Ca supplement. No med changes. F/u prn  Objective:  Lab Results  Component Value Date   CREATININE 0.93 (H) 08/01/2020   BUN 23 08/01/2020   GFR 51.42 (L) 08/17/2019   GFRNONAA 58 (L) 08/01/2020   GFRAA 67 08/01/2020   NA 142 08/01/2020   K 4.9 08/01/2020   CALCIUM 10.6 (H) 08/01/2020   CO2 31 08/01/2020   Lab Results  Component Value Date/Time   HGBA1C 5.7 (H) 08/01/2020 03:50 PM   HGBA1C 5.9 07/16/2019 03:04 PM   GFR 51.42 (L) 08/17/2019 01:05 PM   GFR 48.18 (L) 07/16/2019 03:04 PM    Last diabetic Eye exam: No results found for: HMDIABEYEEXA  Last diabetic Foot exam: No results found for: HMDIABFOOTEX   Lab Results  Component Value Date   CHOL 167 08/01/2020   HDL 98 08/01/2020   LDLCALC 53 08/01/2020   LDLDIRECT 152.6 07/31/2013   TRIG 80 08/01/2020   CHOLHDL 1.7 08/01/2020   Hepatic Function Latest Ref Rng & Units 08/01/2020 07/16/2019 12/26/2018  Total Protein 6.1 - 8.1 g/dL 6.9 7.4 6.5  Albumin 3.5 - 5.2 g/dL - 5.0 4.5  AST 10 - 35 U/L 28 27 35  ALT 6 - 29 U/L 28 19 29  Alk Phosphatase 39 - 117 U/L - 56 55  Total Bilirubin 0.2 - 1.2 mg/dL 0.8 0.8 0.7  Bilirubin, Direct 0.00 - 0.40 mg/dL - - -    

## 2020-12-24 ENCOUNTER — Other Ambulatory Visit: Payer: Self-pay | Admitting: Internal Medicine

## 2021-01-01 ENCOUNTER — Other Ambulatory Visit: Payer: Self-pay

## 2021-01-01 DIAGNOSIS — I10 Essential (primary) hypertension: Secondary | ICD-10-CM

## 2021-01-06 ENCOUNTER — Telehealth: Payer: Self-pay | Admitting: Internal Medicine

## 2021-01-06 NOTE — Telephone Encounter (Signed)
Patients daughter calling to see if we could get the insurance approval for the patient to get the prolia shot

## 2021-01-12 ENCOUNTER — Telehealth: Payer: Medicare HMO

## 2021-01-15 NOTE — Progress Notes (Signed)
HPI: FU CAD and atrial myxoma resection. Echo 4/16 orderded by primary care for cardiomegaly; this revealed normal LV function, left atrial mass; mild MR. Cardiac cath 5/16 showed 80 LAD. Preop carotid dopplers 5/16 with no significant stenosis. Patient had CABG with LIMA to LAD, resection of atrial myxoma and LAA oversewn. Patient had recurrent atrial fibrillation in September 2016. She has been treated with rate controlling medications and anticoagulation. Holter 4/18 showed atrial fibrillation, rate mildly decreased late PM and early AM but otherwise controlled.Echocardiogram January 2020 showed normal LV function, moderate left ventricular hypertrophy, trace aortic insufficiency, mildly dilated aortic root and severe left atrial enlargement. Since last seen,there is no dyspnea, chest pain, palpitations, syncope or bleeding.  Current Outpatient Medications  Medication Sig Dispense Refill  . albuterol (VENTOLIN HFA) 108 (90 Base) MCG/ACT inhaler Inhale 2 puffs into the lungs every 6 (six) hours as needed for wheezing or shortness of breath. 6.7 g 5  . Ascorbic Acid (VITAMIN C) 100 MG tablet Take 100 mg by mouth daily.    Marland Kitchen augmented betamethasone dipropionate (DIPROLENE-AF) 0.05 % cream Apply topically as directed.    . benzoyl peroxide 5 % gel Apply topically 2 (two) times daily.    . calcium citrate (CALCITRATE - DOSED IN MG ELEMENTAL CALCIUM) 950 (200 Ca) MG tablet Take 200 mg of elemental calcium by mouth 2 (two) times daily.    . Cholecalciferol (VITAMIN D3) 1000 UNITS CAPS Take 1,000 Units by mouth daily.     . Coenzyme Q10 (CO Q 10 PO) Take by mouth daily.    Marland Kitchen denosumab (PROLIA) 60 MG/ML SOSY injection Inject 60 mg into the skin every 6 (six) months.    . dicyclomine (BENTYL) 20 MG tablet TAKE 1 TABLET BY MOUTH EVERY 6 HOURS AS NEEDED FOR SPASMS 360 tablet 0  . ELIQUIS 5 MG TABS tablet TAKE 1 TABLET BY MOUTH TWICE A DAY 180 tablet 1  . fexofenadine (ALLEGRA) 180 MG tablet Take 1  tablet (180 mg total) by mouth daily. 30 tablet 11  . fluticasone (FLONASE) 50 MCG/ACT nasal spray Place 2 sprays into both nostrils daily. 16 g 6  . furosemide (LASIX) 40 MG tablet TAKE 1 TABLET BY MOUTH EVERY DAY 90 tablet 0  . hydroxypropyl methylcellulose / hypromellose (ISOPTO TEARS / GONIOVISC) 2.5 % ophthalmic solution 1 drop.    . Lactobacillus (PROBIOTIC ACIDOPHILUS PO) Take 1 capsule by mouth daily.    Marland Kitchen latanoprost (XALATAN) 0.005 % ophthalmic solution Place 1 drop into both eyes daily.    Marland Kitchen losartan (COZAAR) 25 MG tablet TAKE 1 TABLET (25 MG TOTAL) BY MOUTH DAILY. 90 tablet 3  . metoprolol tartrate (LOPRESSOR) 25 MG tablet TAKE 2 TABLETS EVERY MORNING AND 1 TABLET IN THE EVENING 270 tablet 3  . montelukast (SINGULAIR) 10 MG tablet TAKE 1 TABLET BY MOUTH EVERYDAY AT BEDTIME 90 tablet 1  . omeprazole (PRILOSEC) 20 MG capsule TAKE 1 CAPSULE BY MOUTH EVERY DAY 90 capsule 3  . rosuvastatin (CRESTOR) 10 MG tablet TAKE 1 TABLET BY MOUTH EVERY DAY 90 tablet 3  . traMADol (ULTRAM) 50 MG tablet Take 1 tablet (50 mg total) by mouth every 6 (six) hours as needed. Dr.Ramos (Patient taking differently: Take 50 mg by mouth every 6 (six) hours as needed. Dr.Ramos; pt takes 1 tablet every night, maybe 1 during the day as needed) 30 tablet 0   No current facility-administered medications for this visit.     Past Medical History:  Diagnosis Date  . Ankle fracture, left 11/12/2011  . Atrial fibrillation ([place])    Post op after repair of ankle fracture  . 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 CAD (coronary artery disease) 03/2015   LAD 80%, otherwise minimal CAD. s/p LIMA-LAD  . Diverticulitis 2002   based on CT scan  . Diverticulosis    Dr Henrene Pastor  . DVT (deep venous thrombosis) ([place]) 2000   no trigger  . Endometriosis   . Gastritis 2006   @ Endo  . GERD (gastroesophageal reflux disease)   . Hiatal hernia   . Hyperlipidemia   . Hypertension   . IBS  (irritable bowel syndrome)   . Ocular hypertension    glaucoma suspect, Dr. Kathrin Penner  . Skin cancer   . Superficial phlebitis     X 2, 1999, 2000  . Syncope 11/12/11   in context CAP . Carotid Doppler exam was negative. 2D echocardiogram revealed mild dilation of the left atrium and moderate increase in the systolic pressureof  the pulmonary artery  . UTI (lower urinary tract infection) 01/2013   Strep bovis ; PCN sensitive    Past Surgical History:  Procedure Laterality Date  . CARDIAC CATHETERIZATION N/A 03/28/2015   Procedure: Left Heart Cath and Coronary Angiography;  Surgeon: Jolaine Artist, MD;  Location: [place] [facility];  Service: Cardiovascular;  Laterality: N/A;  . CATARACT EXTRACTION Bilateral   . CATARACT EXTRACTION W/ INTRAOCULAR LENS IMPLANT     OS; Dr Kathrin Penner  . COLONOSCOPY  1995, 2002, 2012   diverticulosis; Dr Henrene Pastor  . CORONARY ARTERY BYPASS GRAFT N/A 04/01/2015   Procedure: CORONARY ARTERY BYPASS GRAFTING (CABG), ON PUMP, TIMES ONE, USING LEFT INTERNAL MAMMARY ARTERY;  Surgeon: Ivin Poot, MD;  Location: [place];  Service: Open Heart Surgery;  Laterality: N/A;  . EXCISION OF ATRIAL MYXOMA N/A 04/01/2015   Procedure: EXCISION OF ATRIAL MYXOMA;  Surgeon: Ivin Poot, MD;  Location: [place];  Service: Open Heart Surgery;  Laterality: N/A;  . ORIF ANKLE FRACTURE  11/14/2011   Procedure: OPEN REDUCTION INTERNAL FIXATION (ORIF) ANKLE FRACTURE;  Surgeon: Colin Rhein;  Location: [facility];  Service: Orthopedics;  Laterality: Left;  open reduction internal fixation trimalleolar ankle fracture  . TEE WITHOUT CARDIOVERSION N/A 04/01/2015   Procedure: TRANSESOPHAGEAL ECHOCARDIOGRAM (TEE);  Surgeon: Ivin Poot, MD;  Location: [place];  Service: Open Heart Surgery;  Laterality: N/A;  . TONSILLECTOMY    . TOTAL ABDOMINAL HYSTERECTOMY W/ BILATERAL SALPINGOOPHORECTOMY  1980   dysfunctional menses, endometriosis  . [place], 2009     Social History   Socioeconomic History  . Marital status: Married    Spouse name: Not on file  . Number of children: 2  . Years of education: Not on file  . Highest education level: Not on file  Occupational History  . Occupation: Retired    Fish farm manager: RETIRED  Tobacco Use  . Smoking status: Never Smoker  . Smokeless tobacco: Never Used  Vaping Use  . Vaping Use: Never used  Substance and Sexual Activity  . Alcohol use: No  . Drug use: No  . Sexual activity: Not on file  Other Topics Concern  . Not on file  Social History Narrative   REG EXERCISE   HAD COLONOSCOPY AND ENDOSCOPY DONE DATES UNKNOWN   Social Determinants of Health   Financial Resource Strain: Low  Risk   . Difficulty of Paying Living Expenses: Not very hard  Food Insecurity: Not on file  Transportation Needs: Not on file  Physical Activity: Not on file  Stress: Not on file  Social Connections: Not on file  Intimate Partner Violence: Not on file    Family History  Problem Relation Age of Onset  . Heart attack Father 25  . Alzheimer's disease Mother 68       TIAs  . Colon cancer Maternal Grandmother   . Heart attack Paternal Grandmother 82  . Hyperlipidemia Brother   . Hypertension Brother   . Diverticulitis Daughter        S/P colectomy  . Diabetes Neg Hx   . Stroke Neg Hx   . Hypercalcemia Neg Hx   . Stomach cancer Neg Hx   . Pancreatic cancer Neg Hx   . Esophageal cancer Neg Hx     ROS: no fevers or chills, productive cough, hemoptysis, dysphasia, odynophagia, melena, hematochezia, dysuria, hematuria, rash, seizure activity, orthopnea, PND, pedal edema, claudication. Remaining systems are negative.  Physical Exam: Well-developed well-nourished in no acute distress.  Skin is warm and dry.  HEENT is normal.  Neck is supple.  Chest is clear to auscultation with normal expansion.  Cardiovascular exam is irregular Abdominal exam nontender or distended. No masses palpated. Extremities  show 1+ edema. neuro grossly intact  A/P  1 permanent atrial fibrillation-we will continue beta-blocker and apixaban.  2 history of atrial myxoma-status post resection.  We will plan to repeat echocardiogram to rule out recurrence.  3 hypertension-blood pressure controlled.  Continue present medications and follow.  4 hyperlipidemia-continue statin.  5 coronary artery disease status post coronary artery bypass graft-patient doing well with no chest pain.  Continue statin.  No aspirin given need for apixaban.  6 peripheral edema-continue Lasix.  Check potassium and renal function.  Kirk Ruths, MD

## 2021-01-22 ENCOUNTER — Encounter: Payer: Self-pay | Admitting: Internal Medicine

## 2021-01-22 ENCOUNTER — Other Ambulatory Visit: Payer: Self-pay | Admitting: Cardiology

## 2021-01-22 ENCOUNTER — Ambulatory Visit: Payer: Medicare HMO | Admitting: Internal Medicine

## 2021-01-22 ENCOUNTER — Other Ambulatory Visit: Payer: Self-pay

## 2021-01-22 VITALS — BP 128/80 | HR 116 | Ht 63.0 in | Wt 157.2 lb

## 2021-01-22 DIAGNOSIS — K589 Irritable bowel syndrome without diarrhea: Secondary | ICD-10-CM | POA: Diagnosis not present

## 2021-01-22 DIAGNOSIS — K219 Gastro-esophageal reflux disease without esophagitis: Secondary | ICD-10-CM

## 2021-01-22 DIAGNOSIS — R109 Unspecified abdominal pain: Secondary | ICD-10-CM | POA: Diagnosis not present

## 2021-01-22 MED ORDER — OMEPRAZOLE 20 MG PO CPDR: DELAYED_RELEASE_CAPSULE | ORAL | 3 refills | Status: DC

## 2021-01-22 NOTE — Progress Notes (Signed)
HISTORY OF PRESENT ILLNESS:  Valerie Rollins is a 85 y.o. female with GERD and IBS who presents today for routine follow-up.  She is accompanied by her daughter.  Last evaluated November 02, 2019 regarding the same.  She continues on omeprazole for her chronic reflux symptoms.  As long as she takes her medication she does well.  No appreciable medication side effects.  No dysphagia.  Next, for her irritable bowel with abdominal cramping she takes Bentyl on demand.  This is quite helpful.  She does request a refill of both prescriptions.  She did cut her finger about a week ago and asked me to look at this for her.  REVIEW OF SYSTEMS:  All non-GI ROS negative as otherwise stated in the HPI except for neck swelling, muscle cramps, back pain, arthritis  Past Medical History:  Diagnosis Date  . Ankle fracture, left 11/12/2011  . Atrial fibrillation ([place])    Post op after repair of ankle fracture  . 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 CAD (coronary artery disease) 03/2015   LAD 80%, otherwise minimal CAD. s/p LIMA-LAD  . Diverticulitis 2002   based on CT scan  . Diverticulosis    Dr Henrene Pastor  . DVT (deep venous thrombosis) ([place]) 2000   no trigger  . Endometriosis   . Gastritis 2006   @ Endo  . GERD (gastroesophageal reflux disease)   . Hiatal hernia   . Hyperlipidemia   . Hypertension   . IBS (irritable bowel syndrome)   . Ocular hypertension    glaucoma suspect, Dr. Kathrin Penner  . Skin cancer   . Superficial phlebitis     X 2, 1999, 2000  . Syncope 11/12/11   in context CAP . Carotid Doppler exam was negative. 2D echocardiogram revealed mild dilation of the left atrium and moderate increase in the systolic pressureof  the pulmonary artery  . UTI (lower urinary tract infection) 01/2013   Strep bovis ; PCN sensitive    Past Surgical History:  Procedure Laterality Date  . CARDIAC CATHETERIZATION N/A 03/28/2015   Procedure: Left Heart Cath and  Coronary Angiography;  Surgeon: Jolaine Artist, MD;  Location: [place] [facility] CV [facility];  Service: Cardiovascular;  Laterality: N/A;  . CATARACT EXTRACTION Bilateral   . CATARACT EXTRACTION W/ INTRAOCULAR LENS IMPLANT     OS; Dr Kathrin Penner  . COLONOSCOPY  1995, 2002, 2012   diverticulosis; Dr Henrene Pastor  . CORONARY ARTERY BYPASS GRAFT N/A 04/01/2015   Procedure: CORONARY ARTERY BYPASS GRAFTING (CABG), ON PUMP, TIMES ONE, USING LEFT INTERNAL MAMMARY ARTERY;  Surgeon: Ivin Poot, MD;  Location: [place];  Service: Open Heart Surgery;  Laterality: N/A;  . EXCISION OF ATRIAL MYXOMA N/A 04/01/2015   Procedure: EXCISION OF ATRIAL MYXOMA;  Surgeon: Ivin Poot, MD;  Location: [place];  Service: Open Heart Surgery;  Laterality: N/A;  . ORIF ANKLE FRACTURE  11/14/2011   Procedure: OPEN REDUCTION INTERNAL FIXATION (ORIF) ANKLE FRACTURE;  Surgeon: Colin Rhein;  Location[facility];  Service: Orthopedics;  Laterality: Left;  open reduction internal fixation trimalleolar ankle fracture  . TEE WITHOUT CARDIOVERSION N/A 04/01/2015   Procedure: TRANSESOPHAGEAL ECHOCARDIOGRAM (TEE);  Surgeon: Ivin Poot, MD;  Location: [place];  Service: Open Heart Surgery;  Laterality: N/A;  . TONSILLECTOMY    . TOTAL ABDOMINAL HYSTERECTOMY W/ BILATERAL SALPINGOOPHORECTOMY  1980   dysfunctional menses, endometriosis  . VARICOSE VEIN SURGERY  5784, 1965, 2009    Social History Valerie Rollins  reports that she has never smoked. She has never used smokeless tobacco. She reports that she does not drink alcohol and does not use drugs.  family history includes Alzheimer's disease (age of onset: 52) in her mother; Colon cancer in her maternal grandmother; Diverticulitis in her daughter; Heart attack (age of onset: 57) in her father; Heart attack (age of onset: 7) in her paternal grandmother; Hyperlipidemia in her brother; Hypertension in her brother.  Allergies  Allergen Reactions  . Oxybutynin Chloride     Tongue  swelling; Rx from Dr Hessie Diener  . Propoxyphene N-Acetaminophen     REACTION: made pt blood to thin  . Alendronate Sodium     ? reaction  . Ramipril     REACTION: cough       PHYSICAL EXAMINATION: Vital signs: BP 128/80 (BP Location: Left Arm, Patient Position: Sitting, Cuff Size: Normal)   Pulse (!) 116 Comment: irregular  Ht 5\' 3"  (1.6 m)   Wt 157 lb 4 oz (71.3 kg)   BMI 27.86 kg/m   Constitutional: generally well-appearing, no acute distress Psychiatric: alert and oriented x3, cooperative Eyes: extraocular movements intact, anicteric, conjunctiva pink Mouth: oral pharynx moist, no lesions Neck: supple no lymphadenopathy Cardiovascular: heart regular rate and rhythm, no murmur Lungs: clear to auscultation bilaterally Abdomen: soft, nontender, nondistended, no obvious ascites, no peritoneal signs, normal bowel sounds, no organomegaly Rectal: Omitted Extremities: no lower extremity edema bilaterally.  Finger laceration which is healing.  No obvious infection Skin: no lesions on visible extremities Neuro: No focal deficits.  Cranial nerves intact  ASSESSMENT:  1.  Chronic GERD.  Symptoms controlled with omeprazole 2.  IBS predominance toward constipation.  Managed with on-demand MiraLAX, fiber, and antispasmodics 3.  Finger laceration.  Healing   PLAN:  1.  Reflux precautions 2.  Refill omeprazole 3.  Refill dicyclomine 4.  Instructions on managing her finger wound 5.  Routine office follow-up 1 year.

## 2021-01-22 NOTE — Patient Instructions (Signed)
We have sent the following medications to your pharmacy for you to pick up at your convenience:  Omeprazole  

## 2021-01-26 ENCOUNTER — Other Ambulatory Visit: Payer: Self-pay

## 2021-01-26 ENCOUNTER — Ambulatory Visit: Payer: Medicare HMO | Admitting: Cardiology

## 2021-01-26 ENCOUNTER — Encounter: Payer: Self-pay | Admitting: Cardiology

## 2021-01-26 VITALS — BP 126/64 | HR 86 | Ht 63.0 in | Wt 157.6 lb

## 2021-01-26 DIAGNOSIS — E7800 Pure hypercholesterolemia, unspecified: Secondary | ICD-10-CM | POA: Diagnosis not present

## 2021-01-26 DIAGNOSIS — I2510 Atherosclerotic heart disease of native coronary artery without angina pectoris: Secondary | ICD-10-CM

## 2021-01-26 DIAGNOSIS — I10 Essential (primary) hypertension: Secondary | ICD-10-CM

## 2021-01-26 DIAGNOSIS — I4821 Permanent atrial fibrillation: Secondary | ICD-10-CM | POA: Diagnosis not present

## 2021-01-26 DIAGNOSIS — D151 Benign neoplasm of heart: Secondary | ICD-10-CM

## 2021-01-26 NOTE — Patient Instructions (Signed)
  Lab Work:  Your physician recommends that you HAVE LAB WORK TODAY  If you have labs (blood work) drawn today and your tests are completely normal, you will receive your results only by: . MyChart Message (if you have MyChart) OR . A paper copy in the mail If you have any lab test that is abnormal or we need to change your treatment, we will call you to review the results.   Testing/Procedures:  Your physician has requested that you have an echocardiogram. Echocardiography is a painless test that uses sound waves to create images of your heart. It provides your doctor with information about the size and shape of your heart and how well your heart's chambers and valves are working. This procedure takes approximately one hour. There are no restrictions for this procedure.[address]     Follow-Up: At [facility], you and your health needs are our priority.  As part of our continuing mission to provide you with exceptional heart care, we have created designated Provider Care Teams.  These Care Teams include your primary Cardiologist (physician) and Advanced Practice Providers (APPs -  Physician Assistants and Nurse Practitioners) who all work together to provide you with the care you need, when you need it.  We recommend signing up for the patient portal called "MyChart".  Sign up information is provided on this After Visit Summary.  MyChart is used to connect with patients for Virtual Visits (Telemedicine).  Patients are able to view lab/test results, encounter notes, upcoming appointments, etc.  Non-urgent messages can be sent to your provider as well.   To learn more about what you can do with MyChart, go to [email or web address].    Your next appointment:   6 month(s)  The format for your next appointment:   In Person  Provider:   Brian Crenshaw, MD    

## 2021-01-27 ENCOUNTER — Encounter: Payer: Self-pay | Admitting: *Deleted

## 2021-01-27 LAB — BASIC METABOLIC PANEL
BUN/Creatinine Ratio: 28 (ref 12–28)
BUN: 24 mg/dL (ref 8–27)
CO2: 25 mmol/L (ref 20–29)
Calcium: 9.8 mg/dL (ref 8.7–10.3)
Chloride: 102 mmol/L (ref 96–106)
Creatinine, Ser: 0.87 mg/dL (ref 0.57–1.00)
Glucose: 89 mg/dL (ref 65–99)
Potassium: 3.8 mmol/L (ref 3.5–5.2)
Sodium: 144 mmol/L (ref 134–144)
eGFR: 66 mL/min/{1.73_m2} (ref >59)

## 2021-02-01 ENCOUNTER — Encounter: Payer: Self-pay | Admitting: Internal Medicine

## 2021-02-01 NOTE — Patient Instructions (Addendum)
  Blood work was ordered.     Medications changes include :  none   Your prescription(s) have been submitted to your pharmacy. Please take as directed and contact our office if you believe you are having problem(s) with the medication(s).   A referral was ordered for podiatry.      Someone from their office will call you to schedule an appointment.    Please followup in 6 months

## 2021-02-01 NOTE — Progress Notes (Signed)
Subjective:    Patient ID: Valerie Rollins, female    DOB: [birth year], 85 y.o.   MRN: [id number]  HPI The patient is here for follow up of their chronic medical problems, including Afib, htn, hyperlipidemia, b/l LE edema, CKD,  GERD, allergic rhinitis, OP on prolia.  She is here with her daughter.   She wears her compression socks daily. She uses a wedge pill at night.   She is having leg cramps - not new.  She takes mustard and it helps.  She has cramping during the day or at night.  It usually occurs in her right inner thigh.  It can occur in the right foot -- usually right side.  She thinks it typically occurs when she is sitting.  She some lower back pain if she bends a lot.  Her neck and shoulder hurt more.  She can not extend her neck.  She has an appointment next week with Dr. Nelva Bush.   Medications and allergies reviewed with patient and updated if appropriate.  Patient Active Problem List   Diagnosis Date Noted  . Chronic venous stasis dermatitis of both lower extremities 01/21/2020  . Hypercalcemia 07/19/2019  . CKD (chronic kidney disease) stage 3, GFR 30-59 ml/min 07/19/2019  . Candidal intertrigo 04/20/2018  . Angular stomatitis 04/20/2018  . Seasonal allergic rhinitis due to pollen 03/21/2018  . Bilateral leg edema 12/31/2016  . Prediabetes 01/10/2016  . Osteopenia 12/26/2015  . Mass of neck 07/07/2015  . Long-term (current) use of anticoagulants 04/16/2015  . S/P CABG x 1 04/01/2015  . CAD (coronary artery disease) 03/28/2015  . 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 Cardiomegaly 02/20/2015  . DDD (degenerative disc disease), lumbar 07/20/2012  . Atrial fibrillation ([place]) 11/15/2011  . Syncope 11/12/2011  . RBBB (right bundle branch block) 11/12/2011  . SKIN CANCER, HX OF 11/18/2009  . Diverticulosis of large intestine 10/31/2008  . VARICOSE VEINS, LOWER EXTREMITIES 03/13/2008  . GERD 02/20/2008  . IBS 02/20/2008  . Osteoarthritis 02/20/2008  . Hyperlipidemia 12/01/2006   . Essential hypertension 12/01/2006  . SUPERFICIAL THROMBOPHLEBITIS 12/01/2006  . DVT, HX OF 12/01/2006    Current Outpatient Medications on File Prior to Visit  Medication Sig Dispense Refill  . albuterol (VENTOLIN HFA) 108 (90 Base) MCG/ACT inhaler Inhale 2 puffs into the lungs every 6 (six) hours as needed for wheezing or shortness of breath. 6.7 g 5  . amoxicillin (AMOXIL) 500 MG capsule Take 1,000 mg by mouth 2 (two) times daily.    . Ascorbic Acid (VITAMIN C) 100 MG tablet Take 100 mg by mouth daily.    Marland Kitchen augmented betamethasone dipropionate (DIPROLENE-AF) 0.05 % cream Apply topically as directed.    . benzoyl peroxide 5 % gel Apply topically 2 (two) times daily.    . calcium citrate (CALCITRATE - DOSED IN MG ELEMENTAL CALCIUM) 950 (200 Ca) MG tablet Take 200 mg of elemental calcium by mouth 2 (two) times daily.    . Cholecalciferol (VITAMIN D3) 1000 UNITS CAPS Take 1,000 Units by mouth daily.     . Coenzyme Q10 (CO Q 10 PO) Take by mouth daily.    Marland Kitchen denosumab (PROLIA) 60 MG/ML SOSY injection Inject 60 mg into the skin every 6 (six) months.    . dicyclomine (BENTYL) 20 MG tablet TAKE 1 TABLET BY MOUTH EVERY 6 HOURS AS NEEDED FOR SPASMS 360 tablet 0  . ELIQUIS 5 MG TABS tablet TAKE 1 TABLET BY MOUTH TWICE A DAY 180 tablet 1  .  fexofenadine (ALLEGRA) 180 MG tablet Take 1 tablet (180 mg total) by mouth daily. 30 tablet 11  . fluticasone (FLONASE) 50 MCG/ACT nasal spray Place 2 sprays into both nostrils daily. 16 g 6  . furosemide (LASIX) 40 MG tablet TAKE 1 TABLET BY MOUTH EVERY DAY 90 tablet 0  . hydroxypropyl methylcellulose / hypromellose (ISOPTO TEARS / GONIOVISC) 2.5 % ophthalmic solution 1 drop.    . Lactobacillus (PROBIOTIC ACIDOPHILUS PO) Take 1 capsule by mouth daily.    Marland Kitchen latanoprost (XALATAN) 0.005 % ophthalmic solution Place 1 drop into both eyes daily.    Marland Kitchen losartan (COZAAR) 25 MG tablet TAKE 1 TABLET (25 MG TOTAL) BY MOUTH DAILY. 90 tablet 3  . metoprolol tartrate  (LOPRESSOR) 25 MG tablet TAKE 2 TABLETS EVERY MORNING AND 1 TABLET IN THE EVENING 270 tablet 3  . montelukast (SINGULAIR) 10 MG tablet TAKE 1 TABLET BY MOUTH EVERYDAY AT BEDTIME 90 tablet 1  . omeprazole (PRILOSEC) 20 MG capsule TAKE 1 CAPSULE BY MOUTH EVERY DAY 90 capsule 3  . rosuvastatin (CRESTOR) 10 MG tablet TAKE 1 TABLET BY MOUTH EVERY DAY 90 tablet 3  . traMADol (ULTRAM) 50 MG tablet Take 1 tablet (50 mg total) by mouth every 6 (six) hours as needed. Dr.Ramos (Patient taking differently: Take 50 mg by mouth every 6 (six) hours as needed. Dr.Ramos; pt takes 1 tablet every night, maybe 1 during the day as needed) 30 tablet 0   No current facility-administered medications on file prior to visit.    Past Medical History:  Diagnosis Date  . Ankle fracture, left 11/12/2011  . Atrial fibrillation ([place])    Post op after repair of ankle fracture  . 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 CAD (coronary artery disease) 03/2015   LAD 80%, otherwise minimal CAD. s/p LIMA-LAD  . Diverticulitis 2002   based on CT scan  . Diverticulosis    Dr Henrene Pastor  . DVT (deep venous thrombosis) ([place]) 2000   no trigger  . Endometriosis   . Gastritis 2006   @ Endo  . GERD (gastroesophageal reflux disease)   . Hiatal hernia   . Hyperlipidemia   . Hypertension   . IBS (irritable bowel syndrome)   . Ocular hypertension    glaucoma suspect, Dr. Kathrin Penner  . Skin cancer   . Superficial phlebitis     X 2, 1999, 2000  . Syncope 11/12/11   in context CAP . Carotid Doppler exam was negative. 2D echocardiogram revealed mild dilation of the left atrium and moderate increase in the systolic pressureof  the pulmonary artery  . UTI (lower urinary tract infection) 01/2013   Strep bovis ; PCN sensitive    Past Surgical History:  Procedure Laterality Date  . CARDIAC CATHETERIZATION N/A 03/28/2015   Procedure: Left Heart Cath and Coronary Angiography;  Surgeon: Jolaine Artist,  MD;  Location: [facility];  Service: Cardiovascular;  Laterality: N/A;  . CATARACT EXTRACTION Bilateral   . CATARACT EXTRACTION W/ INTRAOCULAR LENS IMPLANT     OS; Dr Kathrin Penner  . COLONOSCOPY  1995, 2002, 2012   diverticulosis; Dr Henrene Pastor  . CORONARY ARTERY BYPASS GRAFT N/A 04/01/2015   Procedure: CORONARY ARTERY BYPASS GRAFTING (CABG), ON PUMP, TIMES ONE, USING LEFT INTERNAL MAMMARY ARTERY;  Surgeon: Ivin Poot, MD;  Location: [facility];  Service: Open Heart Surgery;  Laterality: N/A;  . EXCISION OF ATRIAL MYXOMA N/A 04/01/2015   Procedure:  EXCISION OF ATRIAL MYXOMA;  Surgeon: Ivin Poot, MD;  Location: [facility];  Service: Open Heart Surgery;  Laterality: N/A;  . ORIF ANKLE FRACTURE  11/14/2011   Procedure: OPEN REDUCTION INTERNAL FIXATION (ORIF) ANKLE FRACTURE;  Surgeon: Valerie Rhein;  Location: [facility];  Service: Orthopedics;  Laterality: Left;  open reduction internal fixation trimalleolar ankle fracture  . TEE WITHOUT CARDIOVERSION N/A 04/01/2015   Procedure: TRANSESOPHAGEAL ECHOCARDIOGRAM (TEE);  Surgeon: Ivin Poot, MD;  Location: [facility];  Service: Open Heart Surgery;  Laterality: N/A;  . TONSILLECTOMY    . TOTAL ABDOMINAL HYSTERECTOMY W/ BILATERAL SALPINGOOPHORECTOMY  1980   dysfunctional menses, endometriosis  . [place], 2009    Social History   Socioeconomic History  . Marital status: Married    Spouse name: Not on file  . Number of children: 2  . Years of education: Not on file  . Highest education level: Not on file  Occupational History  . Occupation: Retired    Fish farm manager: RETIRED  Tobacco Use  . Smoking status: Never Smoker  . Smokeless tobacco: Never Used  Vaping Use  . Vaping Use: Never used  Substance and Sexual Activity  . Alcohol use: No  . Drug use: No  . Sexual activity: Not on file  Other Topics Concern  . Not on file  Social History Narrative   REG EXERCISE   HAD COLONOSCOPY AND ENDOSCOPY DONE DATES  UNKNOWN   Social Determinants of Health   Financial Resource Strain: Low Risk   . Difficulty of Paying Living Expenses: Not very hard  Food Insecurity: Not on file  Transportation Needs: Not on file  Physical Activity: Not on file  Stress: Not on file  Social Connections: Not on file    Family History  Problem Relation Age of Onset  . Heart attack Father 68  . Alzheimer's disease Mother 55       TIAs  . Colon cancer Maternal Grandmother   . Heart attack Paternal Grandmother 82  . Hyperlipidemia Brother   . Hypertension Brother   . Diverticulitis Daughter        S/P colectomy  . Diabetes Neg Hx   . Stroke Neg Hx   . Hypercalcemia Neg Hx   . Stomach cancer Neg Hx   . Pancreatic cancer Neg Hx   . Esophageal cancer Neg Hx     Review of Systems  Constitutional: Negative for chills and fever.  Respiratory: Positive for shortness of breath (occ). Negative for cough and wheezing.   Cardiovascular: Positive for leg swelling. Negative for chest pain and palpitations.  Gastrointestinal:       Jerrye Bushy controlled  Neurological: Negative for light-headedness and headaches.       Objective:   Vitals:   02/02/21 1123  BP: 120/76  Pulse: (!) 58  Temp: 98.3 F (36.8 C)  SpO2: 97%   BP Readings from Last 3 Encounters:  02/02/21 120/76  01/26/21 126/64  01/22/21 128/80   Wt Readings from Last 3 Encounters:  02/02/21 154 lb (69.9 kg)  01/26/21 157 lb 9.6 oz (71.5 kg)  01/22/21 157 lb 4 oz (71.3 kg)   Body mass index is 27.28 kg/m.   Physical Exam    Constitutional: Appears well-developed and well-nourished. No distress.  HENT:  Head: Normocephalic and atraumatic.  Neck: Neck supple. No tracheal deviation present. No thyromegaly present.  No cervical lymphadenopathy Cardiovascular: Normal rate, regular rhythm and normal heart sounds.  2/6 systolic murmur heard. No carotid bruit .  Bilateral lower extremity varicose veins, trace bilateral lower extremity  edema Pulmonary/Chest: Effort normal and breath sounds normal. No respiratory distress. No has no wheezes. No rales.  Skin: Skin is warm and dry. Not diaphoretic.  Psychiatric: Normal mood and affect. Behavior is normal.      Assessment & Plan:    See Problem List for Assessment and Plan of chronic medical problems.    This visit occurred during the SARS-CoV-2 public health emergency.  Safety protocols were in place, including screening questions prior to the visit, additional usage of staff PPE, and extensive cleaning of exam room while observing appropriate contact time as indicated for disinfecting solutions.

## 2021-02-02 ENCOUNTER — Ambulatory Visit (INDEPENDENT_AMBULATORY_CARE_PROVIDER_SITE_OTHER): Payer: Medicare HMO | Admitting: Internal Medicine

## 2021-02-02 ENCOUNTER — Other Ambulatory Visit: Payer: Self-pay

## 2021-02-02 VITALS — BP 120/76 | HR 58 | Temp 98.3°F | Wt 154.0 lb

## 2021-02-02 DIAGNOSIS — M85851 Other specified disorders of bone density and structure, right thigh: Secondary | ICD-10-CM | POA: Diagnosis not present

## 2021-02-02 DIAGNOSIS — R7303 Prediabetes: Secondary | ICD-10-CM | POA: Diagnosis not present

## 2021-02-02 DIAGNOSIS — E782 Mixed hyperlipidemia: Secondary | ICD-10-CM | POA: Diagnosis not present

## 2021-02-02 DIAGNOSIS — L602 Onychogryphosis: Secondary | ICD-10-CM | POA: Diagnosis not present

## 2021-02-02 DIAGNOSIS — K219 Gastro-esophageal reflux disease without esophagitis: Secondary | ICD-10-CM

## 2021-02-02 DIAGNOSIS — M85852 Other specified disorders of bone density and structure, left thigh: Secondary | ICD-10-CM | POA: Diagnosis not present

## 2021-02-02 DIAGNOSIS — N1831 Chronic kidney disease, stage 3a: Secondary | ICD-10-CM | POA: Diagnosis not present

## 2021-02-02 DIAGNOSIS — I4891 Unspecified atrial fibrillation: Secondary | ICD-10-CM

## 2021-02-02 DIAGNOSIS — I10 Essential (primary) hypertension: Secondary | ICD-10-CM | POA: Diagnosis not present

## 2021-02-02 DIAGNOSIS — R600 Localized edema: Secondary | ICD-10-CM | POA: Diagnosis not present

## 2021-02-02 DIAGNOSIS — J301 Allergic rhinitis due to pollen: Secondary | ICD-10-CM

## 2021-02-02 LAB — COMPREHENSIVE METABOLIC PANEL
ALT: 40 U/L — ABNORMAL HIGH (ref 0–35)
AST: 29 U/L (ref 0–37)
Albumin: 4.3 g/dL (ref 3.5–5.2)
Alkaline Phosphatase: 58 U/L (ref 39–117)
BUN: 23 mg/dL (ref 6–23)
CO2: 30 mEq/L (ref 19–32)
Calcium: 10.4 mg/dL (ref 8.4–10.5)
Chloride: 104 mEq/L (ref 96–112)
Creatinine, Ser: 0.87 mg/dL (ref 0.40–1.20)
GFR: 62.01 mL/min (ref >60.00)
Glucose, Bld: 85 mg/dL (ref 70–99)
Potassium: 4.5 mEq/L (ref 3.5–5.1)
Sodium: 142 mEq/L (ref 135–145)
Total Bilirubin: 0.6 mg/dL (ref 0.2–1.2)
Total Protein: 7 g/dL (ref 6.0–8.3)

## 2021-02-02 LAB — CBC WITH DIFFERENTIAL/PLATELET
Basophils Absolute: 0.1 10*3/uL (ref 0.0–0.1)
Basophils Relative: 1 % (ref 0.0–3.0)
Eosinophils Absolute: 0.1 10*3/uL (ref 0.0–0.7)
Eosinophils Relative: 1.9 % (ref 0.0–5.0)
HCT: 41.8 % (ref 36.0–46.0)
Hemoglobin: 14.4 g/dL (ref 12.0–15.0)
Lymphocytes Relative: 21.2 % (ref 12.0–46.0)
Lymphs Abs: 1.3 10*3/uL (ref 0.7–4.0)
MCHC: 34.3 g/dL (ref 30.0–36.0)
MCV: 88.1 fl (ref 78.0–100.0)
Monocytes Absolute: 0.7 10*3/uL (ref 0.1–1.0)
Monocytes Relative: 10.6 % (ref 3.0–12.0)
Neutro Abs: 4 10*3/uL (ref 1.4–7.7)
Neutrophils Relative %: 65.3 % (ref 43.0–77.0)
Platelets: 194 10*3/uL (ref 150.0–400.0)
RBC: 4.74 Mil/uL (ref 3.87–5.11)
RDW: 13.7 % (ref 11.5–15.5)
WBC: 6.1 10*3/uL (ref 4.0–10.5)

## 2021-02-02 LAB — LIPID PANEL
Cholesterol: 152 mg/dL (ref 0–200)
HDL: 83.7 mg/dL (ref >39.00)
LDL Cholesterol: 58 mg/dL (ref 0–99)
NonHDL: 68.43
Total CHOL/HDL Ratio: 2
Triglycerides: 53 mg/dL (ref 0.0–149.0)
VLDL: 10.6 mg/dL (ref 0.0–40.0)

## 2021-02-02 LAB — HEMOGLOBIN A1C: Hgb A1c MFr Bld: 5.9 % (ref 4.6–6.5)

## 2021-02-02 LAB — VITAMIN D 25 HYDROXY (VIT D DEFICIENCY, FRACTURES): VITD: 43.64 ng/mL (ref 30.00–100.00)

## 2021-02-02 MED ORDER — FUROSEMIDE 40 MG PO TABS: 40.0000 mg | ORAL_TABLET | Freq: Every day | ORAL | 1 refills | Status: DC

## 2021-02-02 NOTE — Assessment & Plan Note (Signed)
Chronic GERD controlled Continue omeprazole 20 mg daily  

## 2021-02-02 NOTE — Assessment & Plan Note (Signed)
Chronic BP well controlled Continue losartan 25 mg daily, metoprolol 50 mg in the morning and 25 mg in the evening cmp

## 2021-02-02 NOTE — Assessment & Plan Note (Signed)
Chronic Severe osteopenia with high FRAX On Prolia every 6 months-due now We will check with her insurance and find out her co-pay and schedule soon as possible Continue calcium and vitamin D Check vitamin D level

## 2021-02-02 NOTE — Assessment & Plan Note (Signed)
Chronic Following with cardiology Asymptomatic Continue metoprolol 50 mg every morning and 25 mg in the evening and Eliquis 5 mg twice daily CBC, CMP

## 2021-02-02 NOTE — Assessment & Plan Note (Signed)
Chronic Check lipid panel  Continue Crestor 10 mg daily Regular exercise and healthy diet encouraged

## 2021-02-02 NOTE — Assessment & Plan Note (Signed)
Chronic Has difficulty cutting her nails and since she is on Eliquis would prefer that she did not go to a salon to have her nails done Will refer to podiatry

## 2021-02-02 NOTE — Assessment & Plan Note (Signed)
Chronic Continue Singulair 10 mg in the evening, over-the-counter antihistamine and Flonase as needed She has been getting some mild nosebleeds from the [facility] will use this only as needed, try saline nasal spray and Vaseline in the nose at night

## 2021-02-02 NOTE — Assessment & Plan Note (Signed)
Chronic Related to venous insufficiency Using compression socks daily-we will continue Taking Lasix daily 40 mg and an extra 20 mg daily if needed-continue CMP

## 2021-02-02 NOTE — Assessment & Plan Note (Signed)
Chronic Check a1c Low sugar / carb diet 

## 2021-02-02 NOTE — Assessment & Plan Note (Signed)
Chronic ?Stable ?CMP ?

## 2021-02-03 ENCOUNTER — Telehealth: Payer: Self-pay | Admitting: Internal Medicine

## 2021-02-03 NOTE — Telephone Encounter (Signed)
Patients daughter called to follow up on medications that you were going to look into with her insurance.

## 2021-02-04 NOTE — Telephone Encounter (Signed)
Submitted PA for Dicyclomine to Cover My Meds.

## 2021-02-06 MED ORDER — DICYCLOMINE HCL 20 MG PO TABS: ORAL_TABLET | ORAL | 0 refills | Status: DC

## 2021-02-06 NOTE — Telephone Encounter (Signed)
Bentyl approved through Cover my meds - resent rx with this information to pharmacy

## 2021-02-09 DIAGNOSIS — M542 Cervicalgia: Secondary | ICD-10-CM | POA: Diagnosis not present

## 2021-02-09 DIAGNOSIS — Z79891 Long term (current) use of opiate analgesic: Secondary | ICD-10-CM | POA: Diagnosis not present

## 2021-02-10 ENCOUNTER — Ambulatory Visit: Payer: Medicare HMO | Admitting: Podiatrist

## 2021-02-10 ENCOUNTER — Other Ambulatory Visit: Payer: Self-pay

## 2021-02-10 DIAGNOSIS — L602 Onychogryphosis: Secondary | ICD-10-CM

## 2021-02-10 DIAGNOSIS — I872 Venous insufficiency (chronic) (peripheral): Secondary | ICD-10-CM | POA: Diagnosis not present

## 2021-02-10 DIAGNOSIS — B351 Tinea unguium: Secondary | ICD-10-CM

## 2021-02-10 DIAGNOSIS — L84 Corns and callosities: Secondary | ICD-10-CM | POA: Diagnosis not present

## 2021-02-10 DIAGNOSIS — M79675 Pain in left toe(s): Secondary | ICD-10-CM | POA: Diagnosis not present

## 2021-02-10 DIAGNOSIS — M79674 Pain in right toe(s): Secondary | ICD-10-CM | POA: Diagnosis not present

## 2021-02-10 NOTE — Patient Instructions (Signed)

## 2021-02-12 ENCOUNTER — Encounter: Payer: Self-pay | Admitting: Podiatrist

## 2021-02-12 NOTE — Progress Notes (Signed)
Chief Complaint  Patient presents with   nail trim     nail trim-on blood thinners     HPI: Patient is 85 y.o. female who presents today to have her toenails trimmed.  She is on blood thinners and wears compression stockings.  She had been seeing a pedicurist she liked and who did a good job with her nails until the pandemic and she hasn't been back.  She denies any other pedal complaints.   Patient Active Problem List   Diagnosis Date Noted   Thickened nails 02/02/2021   Chronic venous stasis dermatitis of both lower extremities 01/21/2020   Hypercalcemia 07/19/2019   CKD (chronic kidney disease) stage 3, GFR 30-59 ml/min 07/19/2019   Candidal intertrigo 04/20/2018   Angular stomatitis 04/20/2018   Seasonal allergic rhinitis due to pollen 03/21/2018   Bilateral leg edema 12/31/2016   Prediabetes 01/10/2016   Osteopenia 12/26/2015   Mass of neck 07/07/2015   Long-term (current) use of anticoagulants 04/16/2015   S/P CABG x 1 04/01/2015   CAD (coronary artery disease) 03/28/2015   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   Cardiomegaly 02/20/2015   DDD (degenerative disc disease), lumbar 07/20/2012   Atrial fibrillation ([place]) 11/15/2011   Syncope 11/12/2011   RBBB (right bundle branch block) 11/12/2011   SKIN CANCER, HX OF 11/18/2009   Diverticulosis of large intestine 10/31/2008   VARICOSE VEINS, LOWER EXTREMITIES 03/13/2008   GERD 02/20/2008   IBS 02/20/2008   Osteoarthritis 02/20/2008   Hyperlipidemia 12/01/2006   Essential hypertension 12/01/2006   SUPERFICIAL THROMBOPHLEBITIS 12/01/2006   DVT, HX OF 12/01/2006    Current Outpatient Medications on File Prior to Visit  Medication Sig Dispense Refill   albuterol (VENTOLIN HFA) 108 (90 Base) MCG/ACT inhaler Inhale 2 puffs into the lungs every 6 (six) hours as needed for wheezing or shortness of breath. 6.7 g 5   amoxicillin (AMOXIL) 500 MG capsule Take 1,000 mg by mouth 2 (two) times daily.      Ascorbic Acid (VITAMIN C) 100 MG tablet Take 100 mg by mouth daily.     augmented betamethasone dipropionate (DIPROLENE-AF) 0.05 % cream Apply topically as directed.     benzoyl peroxide 5 % gel Apply topically 2 (two) times daily.     calcium citrate (CALCITRATE - DOSED IN MG ELEMENTAL CALCIUM) 950 (200 Ca) MG tablet Take 200 mg of elemental calcium by mouth 2 (two) times daily.     Cholecalciferol (VITAMIN D3) 1000 UNITS CAPS Take 1,000 Units by mouth daily.      Coenzyme Q10 (CO Q 10 PO) Take by mouth daily.     denosumab (PROLIA) 60 MG/ML SOSY injection Inject 60 mg into the skin every 6 (six) months.     dicyclomine (BENTYL) 20 MG tablet 1 tablet every 6 hours as needed for pain 360 tablet 0   ELIQUIS 5 MG TABS tablet TAKE 1 TABLET BY MOUTH TWICE A DAY 180 tablet 1   fexofenadine (ALLEGRA) 180 MG tablet Take 1 tablet (180 mg total) by mouth daily. 30 tablet 11   fluticasone (FLONASE) 50 MCG/ACT nasal spray Place 2 sprays into both nostrils daily. 16 g 6   furosemide (LASIX) 40 MG tablet Take 1 tablet (40 mg total) by mouth daily. Take an extra 20 mg daily prn 135 tablet 1   hydroxypropyl methylcellulose / hypromellose (ISOPTO TEARS / GONIOVISC) 2.5 % ophthalmic solution 1 drop.     Lactobacillus (PROBIOTIC ACIDOPHILUS PO) Take 1 capsule by mouth daily.     latanoprost (  XALATAN) 0.005 % ophthalmic solution Place 1 drop into both eyes daily.     losartan (COZAAR) 25 MG tablet TAKE 1 TABLET (25 MG TOTAL) BY MOUTH DAILY. 90 tablet 3   metoprolol tartrate (LOPRESSOR) 25 MG tablet TAKE 2 TABLETS EVERY MORNING AND 1 TABLET IN THE EVENING 270 tablet 3   montelukast (SINGULAIR) 10 MG tablet TAKE 1 TABLET BY MOUTH EVERYDAY AT BEDTIME 90 tablet 1   omeprazole (PRILOSEC) 20 MG capsule TAKE 1 CAPSULE BY MOUTH EVERY DAY 90 capsule 3   rosuvastatin (CRESTOR) 10 MG tablet TAKE 1 TABLET BY MOUTH EVERY DAY 90 tablet 3   traMADol (ULTRAM) 50 MG tablet Take 1 tablet (50 mg total) by mouth  every 6 (six) hours as needed. Dr.Ramos (Patient taking differently: Take 50 mg by mouth every 6 (six) hours as needed. Dr.Ramos; pt takes 1 tablet every night, maybe 1 during the day as needed) 30 tablet 0   No current facility-administered medications on file prior to visit.    Allergies  Allergen Reactions   Oxybutynin Chloride     Tongue swelling; Rx from Dr Hessie Diener   Propoxyphene N-Acetaminophen     REACTION: made pt blood to thin   Alendronate Sodium     ? reaction   Ramipril     REACTION: cough    Review of Systems No fevers, chills, nausea, muscle aches, no difficulty breathing, no calf pain, no chest pain or shortness of breath.   Physical Exam  GENERAL APPEARANCE: Alert, conversant. Appropriately groomed. No acute distress.   VASCULAR: Pedal pulses palpable DP and PT bilateral.  Capillary refill time is immediate to all digits,  Proximal to distal cooling it warm to warm.  Digital perfusion adequate.  Generalized edema present.  Multiple varicosities are present.   NEUROLOGIC: sensation is intact to 5.07 monofilament at 5/5 sites bilateral.  Light touch is intact bilateral, vibratory sensation intact bilateral  MUSCULOSKELETAL: acceptable muscle strength, tone and stability bilateral.  No gross boney pedal deformities noted.  No pain, crepitus or limitation noted with foot and ankle range of motion bilateral.   DERMATOLOGIC: skin is warm, supple, and dry.  No open lesions noted.  No rash, small callus present medial aspect of the right first metatarsal head.  Digital nails are thick, discolored, dystrophic, brittle with subungual debris present and clinically mycotic x 10.  Pain with direct pressure noted.   Assessment     ICD-10-CM   1. Thickened nails  L60.2   2. Chronic venous stasis dermatitis of both lower extremities  I87.2   3. Callus of foot  L84   4. Pain due to onychomycosis of toenails of both feet  B35.1    M79.675    M79.674       Plan  Debridement of toenails was recommended.  Onychoreduction of symptomatic toenails was performed via nail nipper and power burr without iatrogenic incident.  Patient was instructed on signs and symptoms of infection and was told to call immediately should any of these arise.

## 2021-02-16 ENCOUNTER — Telehealth: Payer: Self-pay | Admitting: Internal Medicine

## 2021-02-16 NOTE — Telephone Encounter (Signed)
Patient daughter, Eustaquio Maize called and was wondering the status of her Prolia injection. She can be reached at [phone]. Please advise

## 2021-02-17 ENCOUNTER — Other Ambulatory Visit: Payer: Self-pay

## 2021-02-17 ENCOUNTER — Ambulatory Visit (HOSPITAL_COMMUNITY): Payer: Medicare HMO | Attending: Internal Medicine

## 2021-02-17 DIAGNOSIS — D151 Benign neoplasm of heart: Secondary | ICD-10-CM | POA: Diagnosis not present

## 2021-02-17 LAB — ECHOCARDIOGRAM COMPLETE
Area-P 1/2: 2.76 cm2
S' Lateral: 2.2 cm

## 2021-02-23 ENCOUNTER — Other Ambulatory Visit: Payer: Self-pay

## 2021-02-23 ENCOUNTER — Ambulatory Visit (INDEPENDENT_AMBULATORY_CARE_PROVIDER_SITE_OTHER): Payer: Medicare HMO

## 2021-02-23 DIAGNOSIS — M810 Age-related osteoporosis without current pathological fracture: Secondary | ICD-10-CM | POA: Diagnosis not present

## 2021-02-23 MED ORDER — DENOSUMAB 60 MG/ML ~~LOC~~ SOSY
60.0000 mg | PREFILLED_SYRINGE | Freq: Once | SUBCUTANEOUS | Status: AC
  Administered 2021-02-23: 60 mg via SUBCUTANEOUS

## 2021-02-23 NOTE — Progress Notes (Signed)
Pt here for monthly Prolia injection per Dr Quay Burow.  Prolia 60mg  given subcutaneous by A Wright  and pt tolerated injection well.  Next Prolia injection scheduled for 08/26/21.

## 2021-02-24 NOTE — Telephone Encounter (Signed)
Prolia authorized Auth# [id number] Effective dates extended thru 8.19.22 Last Prolia-4.4.22 Next Prolia scheduled: 10.10.22

## 2021-03-04 DIAGNOSIS — M542 Cervicalgia: Secondary | ICD-10-CM | POA: Diagnosis not present

## 2021-03-04 DIAGNOSIS — Z79891 Long term (current) use of opiate analgesic: Secondary | ICD-10-CM | POA: Diagnosis not present

## 2021-03-07 ENCOUNTER — Other Ambulatory Visit: Payer: Self-pay | Admitting: Internal Medicine

## 2021-03-12 DIAGNOSIS — M4802 Spinal stenosis, cervical region: Secondary | ICD-10-CM | POA: Diagnosis not present

## 2021-03-13 ENCOUNTER — Telehealth: Payer: Self-pay | Admitting: Pharmacist

## 2021-03-16 NOTE — Progress Notes (Signed)
Chronic Care Management Pharmacy Assistant   Name: Valerie Rollins  MRN: [id number] DOB: 06/15/1936   Reason for Encounter: Hypertension Disease State Call   Conditions to be addressed/monitored: HTN   Recent office visits:  02/02/21 Dr. Billey Gosling medication change Furosemide extra 20 mg prn  Recent consult visits:  01/22/21 Dr. Leverne Humbles 01/26/21 Dr. [facility] visits:  None in previous 6 months  Medications: Outpatient Encounter Medications as of 03/13/2021  Medication Sig  . albuterol (VENTOLIN HFA) 108 (90 Base) MCG/ACT inhaler Inhale 2 puffs into the lungs every 6 (six) hours as needed for wheezing or shortness of breath.  Marland Kitchen amoxicillin (AMOXIL) 500 MG capsule Take 1,000 mg by mouth 2 (two) times daily.  . Ascorbic Acid (VITAMIN C) 100 MG tablet Take 100 mg by mouth daily.  Marland Kitchen augmented betamethasone dipropionate (DIPROLENE-AF) 0.05 % cream Apply topically as directed.  . benzoyl peroxide 5 % gel Apply topically 2 (two) times daily.  . calcium citrate (CALCITRATE - DOSED IN MG ELEMENTAL CALCIUM) 950 (200 Ca) MG tablet Take 200 mg of elemental calcium by mouth 2 (two) times daily.  . Cholecalciferol (VITAMIN D3) 1000 UNITS CAPS Take 1,000 Units by mouth daily.   . Coenzyme Q10 (CO Q 10 PO) Take by mouth daily.  Marland Kitchen denosumab (PROLIA) 60 MG/ML SOSY injection Inject 60 mg into the skin every 6 (six) months.  . dicyclomine (BENTYL) 20 MG tablet 1 tablet every 6 hours as needed for pain  . ELIQUIS 5 MG TABS tablet TAKE 1 TABLET BY MOUTH TWICE A DAY  . fexofenadine (ALLEGRA) 180 MG tablet Take 1 tablet (180 mg total) by mouth daily.  . fluticasone (FLONASE) 50 MCG/ACT nasal spray Place 2 sprays into both nostrils daily.  . furosemide (LASIX) 40 MG tablet Take 1 tablet (40 mg total) by mouth daily. Take an extra 20 mg daily prn  . hydroxypropyl methylcellulose / hypromellose (ISOPTO TEARS / GONIOVISC) 2.5 % ophthalmic solution 1 drop.  . Lactobacillus  (PROBIOTIC ACIDOPHILUS PO) Take 1 capsule by mouth daily.  Marland Kitchen latanoprost (XALATAN) 0.005 % ophthalmic solution Place 1 drop into both eyes daily.  Marland Kitchen losartan (COZAAR) 25 MG tablet TAKE 1 TABLET (25 MG TOTAL) BY MOUTH DAILY.  . metoprolol tartrate (LOPRESSOR) 25 MG tablet TAKE 2 TABLETS EVERY MORNING AND 1 TABLET IN THE EVENING  . montelukast (SINGULAIR) 10 MG tablet TAKE 1 TABLET BY MOUTH EVERYDAY AT BEDTIME  . omeprazole (PRILOSEC) 20 MG capsule TAKE 1 CAPSULE BY MOUTH EVERY DAY  . rosuvastatin (CRESTOR) 10 MG tablet TAKE 1 TABLET BY MOUTH EVERY DAY  . traMADol (ULTRAM) 50 MG tablet Take 1 tablet (50 mg total) by mouth every 6 (six) hours as needed. Dr.Ramos (Patient taking differently: Take 50 mg by mouth every 6 (six) hours as needed. Dr.Ramos; pt takes 1 tablet every night, maybe 1 during the day as needed)   No facility-administered encounter medications on file as of 03/13/2021.   Reviewed chart prior to disease state call. Spoke with patient regarding BP  Recent Office Vitals: BP Readings from Last 3 Encounters:  02/02/21 120/76  01/26/21 126/64  01/22/21 128/80   Pulse Readings from Last 3 Encounters:  02/02/21 (!) 58  01/26/21 86  01/22/21 (!) 116    Wt Readings from Last 3 Encounters:  02/02/21 154 lb (69.9 kg)  01/26/21 157 lb 9.6 oz (71.5 kg)  01/22/21 157 lb 4 oz (71.3 kg)     Kidney  Function Lab Results  Component Value Date/Time   CREATININE 0.87 02/02/2021 12:27 PM   CREATININE 0.87 01/26/2021 01:54 PM   CREATININE 0.93 (H) 08/01/2020 03:50 PM   CREATININE 0.94 (H) 01/27/2016 11:59 AM   GFR 62.01 02/02/2021 12:27 PM   GFRNONAA 58 (L) 08/01/2020 03:50 PM   GFRAA 67 08/01/2020 03:50 PM    BMP Latest Ref Rng & Units 02/02/2021 01/26/2021 08/01/2020  Glucose 70 - 99 mg/dL 85 89 93  BUN 6 - 23 mg/dL 23 24 23   Creatinine 0.40 - 1.20 mg/dL 0.87 0.87 0.93(H)  BUN/Creat Ratio 12 - 28 - 28 25(H)  Sodium 135 - 145 mEq/L 142 144 142  Potassium 3.5 - 5.1 mEq/L 4.5 3.8  4.9  Chloride 96 - 112 mEq/L 104 102 101  CO2 19 - 32 mEq/L 30 25 31   Calcium 8.4 - 10.5 mg/dL 10.4 9.8 10.6(H)    . Current antihypertensive regimen: Losartan 25 mg daily, metoprolol tartrate 25 mg 2 tab daily and furosemide 40 mg daily extra 20 mg when needed  . How often are you checking your Blood Pressure? Patient states that she usually gets blood pressure checked at the doctor's office   . Current home BP readings: Patient has not home readings but last reading from Dr. Quay Burow was 120/76  . What recent interventions/DTPs have been made by any provider to improve Blood Pressure control since last CPP Visit: Patient is to continue blood pressure medications and cmp per Dr. Quay Burow 02/02/21  . Any recent hospitalizations or ED visits since last visit with CPP? None ID  . What diet changes have been made to improve Blood Pressure Control?  Patient states that she watches her salt intake  . What exercise is being done to improve your Blood Pressure Control?  Patient states that she has not made any changes to exercise  Adherence Review: Is the patient currently on ACE/ARB medication? Losartan Does the patient have >5 day gap between last estimated fill dates? No   Star Rating Drugs: Losartan 12/27/20 90 ds Rosuvastatin 12/19/20 90 ds  Ethelene Hal Clinical Pharmacist Assistant [phone]  Time spent:31

## 2021-04-15 ENCOUNTER — Telehealth: Payer: Self-pay | Admitting: Internal Medicine

## 2021-04-15 DIAGNOSIS — I872 Venous insufficiency (chronic) (peripheral): Secondary | ICD-10-CM | POA: Diagnosis not present

## 2021-04-15 NOTE — Telephone Encounter (Signed)
Patients daughter called and said that the patient was at the dermatologist office today and they are wanting the patient to see a vein and vascular doctor. They referred the patient to [place] but the patient is wanting to know if Dr. Quay Burow recommends somewhere else, she said if so could a referral be placed. Please advise

## 2021-04-15 NOTE — Telephone Encounter (Signed)
That practice is very good so I think she will be in good hands.

## 2021-04-15 NOTE — Telephone Encounter (Signed)
Patients daughter is requesting a call back and she can be reached at [phone]

## 2021-04-16 ENCOUNTER — Other Ambulatory Visit: Payer: Self-pay

## 2021-04-16 DIAGNOSIS — I8390 Asymptomatic varicose veins of unspecified lower extremity: Secondary | ICD-10-CM

## 2021-04-16 NOTE — Telephone Encounter (Signed)
Spoke with [employer] today.

## 2021-04-21 ENCOUNTER — Ambulatory Visit (HOSPITAL_COMMUNITY)
Admission: RE | Admit: 2021-04-21 | Discharge: 2021-04-21 | Disposition: A | Discharge status: Home / Self Care | Payer: Medicare HMO | Source: Ambulatory Visit | Attending: Vascular Surgery | Admitting: Vascular Surgery

## 2021-04-21 ENCOUNTER — Encounter: Payer: Self-pay | Admitting: Vascular Surgery

## 2021-04-21 ENCOUNTER — Ambulatory Visit: Payer: Medicare HMO | Admitting: Vascular Surgery

## 2021-04-21 ENCOUNTER — Other Ambulatory Visit: Payer: Self-pay

## 2021-04-21 VITALS — BP 144/69 | HR 45 | Temp 98.1°F | Resp 16 | Ht 63.0 in | Wt 152.0 lb

## 2021-04-21 DIAGNOSIS — I872 Venous insufficiency (chronic) (peripheral): Secondary | ICD-10-CM | POA: Diagnosis not present

## 2021-04-21 DIAGNOSIS — I8390 Asymptomatic varicose veins of unspecified lower extremity: Secondary | ICD-10-CM | POA: Insufficient documentation

## 2021-04-21 NOTE — Progress Notes (Signed)
[facility] [place]  ASSESSMENT / PLAN: [name] is a 85 y.o. female with venous insufficiency of bilateral lower extremities. C3 - edema, Ep - primary, bilateral femoral and superficial vein reflux. Patient counseled extensively about the natural history of lower extremity venous disease.  Given her mixed deep and superficial disease I do not think an ablative procedure will help her.  I encouraged her to continue compression, elevation as able. Follow up with me as needed.  CHIEF COMPLAINT: Edema  HISTORY OF PRESENT ILLNESS: Valerie Rollins is a 85 y.o. female who presents to clinic for evaluation of bilateral lower extremity chronic venous insufficiency.  She has had a long history of the same.  In 1960s she underwent ligation and stripping of the?  Right greater saphenous vein -patient is unsure of laterality and no scars remain on her legs.  Since that time she has had difficulty with significant swelling in her lower extremities.  She reports good compliance with compression and elevation.  These both seem to give her some relief.  She has never had a venous ulcer..  Past Medical History:  Diagnosis Date  . Ankle fracture, left 11/12/2011  . Atrial fibrillation ([place])    Post op after repair of ankle fracture  . 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 CAD (coronary artery disease) 03/2015   LAD 80%, otherwise minimal CAD. s/p LIMA-LAD  . Diverticulitis 2002   based on CT scan  . Diverticulosis    Dr Henrene Pastor  . DVT (deep venous thrombosis) ([place]) 2000   no trigger  . Endometriosis   . Gastritis 2006   @ Endo  . GERD (gastroesophageal reflux disease)   . Hiatal hernia   . Hyperlipidemia   . Hypertension   . IBS (irritable bowel syndrome)   . Ocular hypertension    glaucoma suspect, Dr. Kathrin Penner  . Skin cancer   . Superficial phlebitis     X 2, 1999, 2000  . Syncope 11/12/11   in context CAP . Carotid Doppler exam was  negative. 2D echocardiogram revealed mild dilation of the left atrium and moderate increase in the systolic pressureof  the pulmonary artery  . UTI (lower urinary tract infection) 01/2013   Strep bovis ; PCN sensitive    Past Surgical History:  Procedure Laterality Date  . CARDIAC CATHETERIZATION N/A 03/28/2015   Procedure: Left Heart Cath and Coronary Angiography;  Surgeon: Jolaine Artist, MD;  Location: [place] INVASIVE CV LAB CUPID;  Service: Cardiovascular;  Laterality: N/A;  . CATARACT EXTRACTION Bilateral   . CATARACT EXTRACTION W/ INTRAOCULAR LENS IMPLANT     OS; Dr Kathrin Penner  . COLONOSCOPY  1995, 2002, 2012   diverticulosis; Dr Henrene Pastor  . CORONARY ARTERY BYPASS GRAFT N/A 04/01/2015   Procedure: CORONARY ARTERY BYPASS GRAFTING (CABG), ON PUMP, TIMES ONE, USING LEFT INTERNAL MAMMARY ARTERY;  Surgeon: Ivin Poot, MD;  Location: [place];  Service: Open Heart Surgery;  Laterality: N/A;  . EXCISION OF ATRIAL MYXOMA N/A 04/01/2015   Procedure: EXCISION OF ATRIAL MYXOMA;  Surgeon: Ivin Poot, MD;  Location: [facility];  Service: Open Heart Surgery;  Laterality: N/A;  . ORIF ANKLE FRACTURE  11/14/2011   Procedure: OPEN REDUCTION INTERNAL FIXATION (ORIF) ANKLE FRACTURE;  Surgeon: Colin Rhein;  Location: [facility];  Service: Orthopedics;  Laterality: Left;  open reduction internal fixation trimalleolar ankle fracture  . TEE WITHOUT CARDIOVERSION N/A 04/01/2015  Procedure: TRANSESOPHAGEAL ECHOCARDIOGRAM (TEE);  Surgeon: Ivin Poot, MD;  Location: [place];  Service: Open Heart Surgery;  Laterality: N/A;  . TONSILLECTOMY    . TOTAL ABDOMINAL HYSTERECTOMY W/ BILATERAL SALPINGOOPHORECTOMY  1980   dysfunctional menses, endometriosis  . [place], 2009    Family History  Problem Relation Age of Onset  . Heart attack Father 53  . Alzheimer's disease Mother 71       TIAs  . Colon cancer Maternal Grandmother   . Heart attack Paternal Grandmother 82  . Hyperlipidemia  Brother   . Hypertension Brother   . Diverticulitis Daughter        S/P colectomy  . Diabetes Neg Hx   . Stroke Neg Hx   . Hypercalcemia Neg Hx   . Stomach cancer Neg Hx   . Pancreatic cancer Neg Hx   . Esophageal cancer Neg Hx     Social History   Socioeconomic History  . Marital status: Married    Spouse name: Not on file  . Number of children: 2  . Years of education: Not on file  . Highest education level: Not on file  Occupational History  . Occupation: Retired    Fish farm manager: RETIRED  Tobacco Use  . Smoking status: Never Smoker  . Smokeless tobacco: Never Used  Vaping Use  . Vaping Use: Never used  Substance and Sexual Activity  . Alcohol use: No  . Drug use: No  . Sexual activity: Not on file  Other Topics Concern  . Not on file  Social History Narrative   REG EXERCISE   HAD COLONOSCOPY AND ENDOSCOPY DONE DATES UNKNOWN   Social Determinants of Health   Financial Resource Strain: Low Risk   . Difficulty of Paying Living Expenses: Not very hard  Food Insecurity: Not on file  Transportation Needs: Not on file  Physical Activity: Not on file  Stress: Not on file  Social Connections: Not on file  Intimate Partner Violence: Not on file    Allergies  Allergen Reactions  . Oxybutynin Chloride     Tongue swelling; Rx from Dr Hessie Diener  . Propoxyphene N-Acetaminophen     REACTION: made pt blood to thin  . Alendronate Sodium     ? reaction  . Ramipril     REACTION: cough    Current Outpatient Medications  Medication Sig Dispense Refill  . albuterol (VENTOLIN HFA) 108 (90 Base) MCG/ACT inhaler Inhale 2 puffs into the lungs every 6 (six) hours as needed for wheezing or shortness of breath. 6.7 g 5  . amoxicillin (AMOXIL) 500 MG capsule Take 1,000 mg by mouth 2 (two) times daily.    . Ascorbic Acid (VITAMIN C) 100 MG tablet Take 100 mg by mouth daily.    Marland Kitchen augmented betamethasone dipropionate (DIPROLENE-AF) 0.05 % cream Apply topically as directed.    . benzoyl  peroxide 5 % gel Apply topically 2 (two) times daily.    . calcium citrate (CALCITRATE - DOSED IN MG ELEMENTAL CALCIUM) 950 (200 Ca) MG tablet Take 200 mg of elemental calcium by mouth 2 (two) times daily.    . Cholecalciferol (VITAMIN D3) 1000 UNITS CAPS Take 1,000 Units by mouth daily.     . Coenzyme Q10 (CO Q 10 PO) Take by mouth daily.    Marland Kitchen denosumab (PROLIA) 60 MG/ML SOSY injection Inject 60 mg into the skin every 6 (six) months.    . dicyclomine (BENTYL) 20 MG tablet 1  tablet every 6 hours as needed for pain 360 tablet 0  . ELIQUIS 5 MG TABS tablet TAKE 1 TABLET BY MOUTH TWICE A DAY 180 tablet 1  . fexofenadine (ALLEGRA) 180 MG tablet Take 1 tablet (180 mg total) by mouth daily. 30 tablet 11  . fluticasone (FLONASE) 50 MCG/ACT nasal spray Place 2 sprays into both nostrils daily. 16 g 6  . furosemide (LASIX) 40 MG tablet Take 1 tablet (40 mg total) by mouth daily. Take an extra 20 mg daily prn 135 tablet 1  . hydroxypropyl methylcellulose / hypromellose (ISOPTO TEARS / GONIOVISC) 2.5 % ophthalmic solution 1 drop.    . Lactobacillus (PROBIOTIC ACIDOPHILUS PO) Take 1 capsule by mouth daily.    Marland Kitchen latanoprost (XALATAN) 0.005 % ophthalmic solution Place 1 drop into both eyes daily.    Marland Kitchen losartan (COZAAR) 25 MG tablet TAKE 1 TABLET (25 MG TOTAL) BY MOUTH DAILY. 90 tablet 3  . metoprolol tartrate (LOPRESSOR) 25 MG tablet TAKE 2 TABLETS EVERY MORNING AND 1 TABLET IN THE EVENING 270 tablet 3  . montelukast (SINGULAIR) 10 MG tablet TAKE 1 TABLET BY MOUTH EVERYDAY AT BEDTIME 90 tablet 1  . omeprazole (PRILOSEC) 20 MG capsule TAKE 1 CAPSULE BY MOUTH EVERY DAY 90 capsule 3  . rosuvastatin (CRESTOR) 10 MG tablet TAKE 1 TABLET BY MOUTH EVERY DAY 90 tablet 3  . traMADol (ULTRAM) 50 MG tablet Take 1 tablet (50 mg total) by mouth every 6 (six) hours as needed. Dr.Ramos (Patient taking differently: Take 50 mg by mouth every 6 (six) hours as needed. Dr.Ramos; pt takes 1 tablet every night, maybe 1 during the day  as needed) 30 tablet 0   No current facility-administered medications for this visit.    REVIEW OF SYSTEMS:  [X]  denotes positive finding, [ ]  denotes negative finding Cardiac  Comments:  Chest pain or chest pressure:    Shortness of breath upon exertion:    Short of breath when lying flat:    Irregular heart rhythm:        Vascular    Pain in calf, thigh, or hip brought on by ambulation:    Pain in feet at night that wakes you up from your sleep:     Blood clot in your veins:    Leg swelling:  x       Pulmonary    Oxygen at home:    Productive cough:     Wheezing:         Neurologic    Sudden weakness in arms or legs:     Sudden numbness in arms or legs:     Sudden onset of difficulty speaking or slurred speech:    Temporary loss of vision in one eye:     Problems with dizziness:         Gastrointestinal    Blood in stool:     Vomited blood:         Genitourinary    Burning when urinating:     Blood in urine:        Psychiatric    Major depression:         Hematologic    Bleeding problems:    Problems with blood clotting too easily:        Skin    Rashes or ulcers:        Constitutional    Fever or chills:      PHYSICAL EXAM  Vitals:   04/21/21 1412  BP: Marland Kitchen)  144/69  Pulse: (!) 45  Resp: 16  Temp: 98.1 F (36.7 C)  SpO2: 95%  Weight: 152 lb (68.9 kg)  Height: 5\' 3"  (1.6 m)    Constitutional: elderly, but well appearing. no distress. Appears well nourished.  Neurologic: CN intact. no focal findings. no sensory loss. Psychiatric: Mood and affect symmetric and appropriate. Eyes: No icterus. No conjunctival pallor. Ears, nose, throat: mucous membranes moist. Midline trachea.  Cardiac: regular rate and rhythm.  Respiratory: unlabored. Abdominal: soft, non-tender, non-distended.  Peripheral vascular:  Profound varicosities about the feet, ankles, calves, and thighs bilaterally  Telangactasias throughout the legs  Corona phlebectatica  bilaterally  Chronic venous stasis dermatitis  Warm well perfused feet. Extremity: No edema. No cyanosis. No pallor.  Skin: No gangrene. No ulceration.  Lymphatic: No Stemmer's sign. No palpable lymphadenopathy.  PERTINENT LABORATORY AND RADIOLOGIC DATA  Most recent CBC CBC Latest Ref Rng & Units 02/02/2021 08/01/2020 07/16/2019  WBC 4.0 - 10.5 K/uL 6.1 5.9 7.1  Hemoglobin 12.0 - 15.0 g/dL 14.4 14.7 15.1(H)  Hematocrit 36.0 - 46.0 % 41.8 43.4 44.4  Platelets 150.0 - 400.0 K/uL 194.0 206 202.0     Most recent CMP CMP Latest Ref Rng & Units 02/02/2021 01/26/2021 08/01/2020  Glucose 70 - 99 mg/dL 85 89 93  BUN 6 - 23 mg/dL 23 24 23   Creatinine 0.40 - 1.20 mg/dL 0.87 0.87 0.93(H)  Sodium 135 - 145 mEq/L 142 144 142  Potassium 3.5 - 5.1 mEq/L 4.5 3.8 4.9  Chloride 96 - 112 mEq/L 104 102 101  CO2 19 - 32 mEq/L 30 25 31   Calcium 8.4 - 10.5 mg/dL 10.4 9.8 10.6(H)  Total Protein 6.0 - 8.3 g/dL 7.0 - 6.9  Total Bilirubin 0.2 - 1.2 mg/dL 0.6 - 0.8  Alkaline Phos 39 - 117 U/L 58 - -  AST 0 - 37 U/L 29 - 28  ALT 0 - 35 U/L 40(H) - 28    Renal function CrCl cannot be calculated (Patient's most recent lab result is older than the maximum 21 days allowed.).  Hgb A1c MFr Bld (%)  Date Value  02/02/2021 5.9    LDL (calc)  Date Value Ref Range Status  02/26/2015 120 (H) 0 - 99 mg/dL Final    Comment:                              Optimal               <  100                           Above optimal     100 -  129                           Borderline        130 -  159                           High              160 -  189                           Very high             >  189 LDL-C is  inaccurate if patient is non-fasting.    LDL Cholesterol (Calc)  Date Value Ref Range Status  08/01/2020 53 mg/dL (calc) Final    Comment:    Reference range: <100 . Desirable range <100 mg/dL for primary prevention;   <70 mg/dL for patients with CHD or diabetic patients  with > or = 2 CHD risk  factors. Marland Kitchen LDL-C is now calculated using the Martin-Hopkins  calculation, which is a validated novel method providing  better accuracy than the Friedewald equation in the  estimation of LDL-C.  Cresenciano Genre et al. Annamaria Helling. 3646;803(21): 2061-2068  (http://education.[employer].com/faq/FAQ164)    LDL Cholesterol  Date Value Ref Range Status  02/02/2021 58 0 - 99 mg/dL Final   Direct LDL  Date Value Ref Range Status  07/31/2013 152.6 mg/dL Final    Comment:    Optimal:  <100 mg/dLNear or Above Optimal:  100-129 mg/dLBorderline High:  130-159 mg/dLHigh:  160-189 mg/dLVery High:  >190 mg/dL    Bilateral lower extremity reflux 04/21/21 Deep / superficial venous reflux bilaterally R GSV in the thigh not visualized (likely previous stripping)   Yevonne Aline. Stanford Breed, MD [facility] Phone Number: [phone] 04/21/2021 4:58 PM

## 2021-04-27 DIAGNOSIS — H401131 Primary open-angle glaucoma, bilateral, mild stage: Secondary | ICD-10-CM | POA: Diagnosis not present

## 2021-04-27 DIAGNOSIS — Z961 Presence of intraocular lens: Secondary | ICD-10-CM | POA: Diagnosis not present

## 2021-05-06 ENCOUNTER — Other Ambulatory Visit: Payer: Self-pay | Admitting: Cardiology

## 2021-05-06 NOTE — Telephone Encounter (Signed)
Prescription refill request for Eliquis received. Indication:atrial fib Last office visit:3/22 Scr:0.8 Age: 85 Weight:68.9 kg  Prescription refilled

## 2021-05-14 ENCOUNTER — Telehealth: Payer: Self-pay | Admitting: Pharmacist

## 2021-05-14 NOTE — Progress Notes (Signed)
Chronic Care Management Pharmacy Assistant   Name: Valerie Rollins MRN: [id number] DOB: [birth year]  Reason for Encounter: Disease State - Hypertension   Recent office visits:  None noted  Recent consult visits:  04/21/21 [facility] (Vascular Surgery) - New Patient Chronic venous insufficiency.  Hospital visits:  None in previous 6 months  Medications: Outpatient Encounter Medications as of 05/14/2021  Medication Sig   albuterol (VENTOLIN HFA) 108 (90 Base) MCG/ACT inhaler Inhale 2 puffs into the lungs every 6 (six) hours as needed for wheezing or shortness of breath.   amoxicillin (AMOXIL) 500 MG capsule Take 1,000 mg by mouth 2 (two) times daily.   Ascorbic Acid (VITAMIN C) 100 MG tablet Take 100 mg by mouth daily.   augmented betamethasone dipropionate (DIPROLENE-AF) 0.05 % cream Apply topically as directed.   benzoyl peroxide 5 % gel Apply topically 2 (two) times daily.   calcium citrate (CALCITRATE - DOSED IN MG ELEMENTAL CALCIUM) 950 (200 Ca) MG tablet Take 200 mg of elemental calcium by mouth 2 (two) times daily.   Cholecalciferol (VITAMIN D3) 1000 UNITS CAPS Take 1,000 Units by mouth daily.    Coenzyme Q10 (CO Q 10 PO) Take by mouth daily.   denosumab (PROLIA) 60 MG/ML SOSY injection Inject 60 mg into the skin every 6 (six) months.   dicyclomine (BENTYL) 20 MG tablet 1 tablet every 6 hours as needed for pain   ELIQUIS 5 MG TABS tablet TAKE 1 TABLET BY MOUTH TWICE A DAY   fexofenadine (ALLEGRA) 180 MG tablet Take 1 tablet (180 mg total) by mouth daily.   fluticasone (FLONASE) 50 MCG/ACT nasal spray Place 2 sprays into both nostrils daily.   furosemide (LASIX) 40 MG tablet Take 1 tablet (40 mg total) by mouth daily. Take an extra 20 mg daily prn   hydroxypropyl methylcellulose / hypromellose (ISOPTO TEARS / GONIOVISC) 2.5 % ophthalmic solution 1 drop.   Lactobacillus (PROBIOTIC ACIDOPHILUS PO) Take 1 capsule by mouth daily.   latanoprost (XALATAN) 0.005 % ophthalmic solution Place  1 drop into both eyes daily.   losartan (COZAAR) 25 MG tablet TAKE 1 TABLET (25 MG TOTAL) BY MOUTH DAILY.   metoprolol tartrate (LOPRESSOR) 25 MG tablet TAKE 2 TABLETS EVERY MORNING AND 1 TABLET IN THE EVENING   montelukast (SINGULAIR) 10 MG tablet TAKE 1 TABLET BY MOUTH EVERYDAY AT BEDTIME   omeprazole (PRILOSEC) 20 MG capsule TAKE 1 CAPSULE BY MOUTH EVERY DAY   rosuvastatin (CRESTOR) 10 MG tablet TAKE 1 TABLET BY MOUTH EVERY DAY   traMADol (ULTRAM) 50 MG tablet Take 1 tablet (50 mg total) by mouth every 6 (six) hours as needed. Dr.Ramos (Patient taking differently: Take 50 mg by mouth every 6 (six) hours as needed. Dr.Ramos; pt takes 1 tablet every night, maybe 1 during the day as needed)   No facility-administered encounter medications on file as of 05/14/2021.   Reviewed chart prior to disease state call. Spoke with patient regarding BP  Recent Office Vitals: BP Readings from Last 3 Encounters:  04/21/21 (!) 144/69  02/02/21 120/76  01/26/21 126/64   Pulse Readings from Last 3 Encounters:  04/21/21 (!) 45  02/02/21 (!) 58  01/26/21 86    Wt Readings from Last 3 Encounters:  04/21/21 152 lb (68.9 kg)  02/02/21 154 lb (69.9 kg)  01/26/21 157 lb 9.6 oz (71.5 kg)     Kidney Function Lab Results  Component Value Date/Time   CREATININE 0.87 02/02/2021 12:27 PM   CREATININE 0.87 01/26/2021  01:54 PM   CREATININE 0.93 (H) 08/01/2020 03:50 PM   CREATININE 0.94 (H) 01/27/2016 11:59 AM   GFR 62.01 02/02/2021 12:27 PM   GFRNONAA 58 (L) 08/01/2020 03:50 PM   GFRAA 67 08/01/2020 03:50 PM    BMP Latest Ref Rng & Units 02/02/2021 01/26/2021 08/01/2020  Glucose 70 - 99 mg/dL 85 89 93  BUN 6 - 23 mg/dL 23 24 23   Creatinine 0.40 - 1.20 mg/dL 0.87 0.87 0.93(H)  BUN/Creat Ratio 12 - 28 - 28 25(H)  Sodium 135 - 145 mEq/L 142 144 142  Potassium 3.5 - 5.1 mEq/L 4.5 3.8 4.9  Chloride 96 - 112 mEq/L 104 102 101  CO2 19 - 32 mEq/L 30 25 31   Calcium 8.4 - 10.5 mg/dL 10.4 9.8 10.6(H)    Current  antihypertensive regimen:  Losartan 25 mg daily AM Metoprolol tartrate 25 mg - 2 tab AM, 1 tab PM Furosemide 40 mg daily  How often are you checking your Blood Pressure?   Current home BP readings:   What recent interventions/DTPs have been made by any provider to improve Blood Pressure control since last CPP Visit:   Any recent hospitalizations or ED visits since last visit with CPP? No  What diet changes have been made to improve Blood Pressure Control?    What exercise is being done to improve your Blood Pressure Control?    Adherence Review: Is the patient currently on ACE/ARB medication? Yes  Does the patient have >5 day gap between last estimated fill dates? No  Star Rating Drugs: Rosuvastatin - last fill 03/15/21 90D   Reviewed chart for medication changes and drug therapy problems ahead of medication adherence call.  Attempted to contact patient x 3 for medication review and health check, unable to reach patient, left voicemails to return call.  Orinda Kenner, [place] Clinical Pharmacists Assistant [phone]  Time Spent: [phone]

## 2021-05-30 ENCOUNTER — Other Ambulatory Visit: Payer: Self-pay | Admitting: Internal Medicine

## 2021-06-10 ENCOUNTER — Telehealth: Payer: Self-pay | Admitting: Internal Medicine

## 2021-06-10 NOTE — Telephone Encounter (Signed)
LVM for pt to rtn my call to schedule AWV with [facility]. Please schedule this appt if pt calls the office.  °

## 2021-06-11 ENCOUNTER — Other Ambulatory Visit: Payer: Self-pay | Admitting: Cardiology

## 2021-06-11 DIAGNOSIS — E7800 Pure hypercholesterolemia, unspecified: Secondary | ICD-10-CM

## 2021-06-29 DIAGNOSIS — Z79891 Long term (current) use of opiate analgesic: Secondary | ICD-10-CM | POA: Diagnosis not present

## 2021-06-29 DIAGNOSIS — M5136 Other intervertebral disc degeneration, lumbar region: Secondary | ICD-10-CM | POA: Diagnosis not present

## 2021-06-29 DIAGNOSIS — M5459 Other low back pain: Secondary | ICD-10-CM | POA: Diagnosis not present

## 2021-07-02 NOTE — Progress Notes (Signed)
HPI: FU CAD and atrial myxoma resection. Echo 4/16 orderded by primary care for cardiomegaly; this revealed normal LV function, left atrial mass; mild MR. Cardiac cath 5/16 showed 80 LAD. Preop carotid dopplers 5/16 with no significant stenosis. Patient had CABG with LIMA to LAD, resection of atrial myxoma and LAA oversewn. Patient had recurrent atrial fibrillation in September 2016. She has been treated with rate controlling medications and anticoagulation. Holter 4/18 showed atrial fibrillation, rate mildly decreased late PM and early AM but otherwise controlled.  Echocardiogram March 2022 showed normal LV function, mild left ventricular hypertrophy, grade 2 diastolic dysfunction, moderate right ventricular enlargement, moderate pulmonary hypertension, severe tricuspid regurgitation, severe left atrial enlargement, moderate right atrial enlargement, mild mitral regurgitation, mildly dilated aortic root.  No evidence of recurrent atrial myxoma.  Since last seen, the patient has dyspnea with more extreme activities but not with routine activities. It is relieved with rest. It is not associated with chest pain. There is no orthopnea, PND or pedal edema. There is no syncope or palpitations. There is no exertional chest pain.   Current Outpatient Medications  Medication Sig Dispense Refill   albuterol (VENTOLIN HFA) 108 (90 Base) MCG/ACT inhaler Inhale 2 puffs into the lungs every 6 (six) hours as needed for wheezing or shortness of breath. 6.7 g 5   Ascorbic Acid (VITAMIN C) 100 MG tablet Take 100 mg by mouth daily.     augmented betamethasone dipropionate (DIPROLENE-AF) 0.05 % cream Apply topically as directed.     benzoyl peroxide 5 % gel Apply topically 2 (two) times daily.     calcium citrate (CALCITRATE - DOSED IN MG ELEMENTAL CALCIUM) 950 (200 Ca) MG tablet Take 200 mg of elemental calcium by mouth 2 (two) times daily.     Cholecalciferol (VITAMIN D3) 1000 UNITS CAPS Take 1,000 Units by mouth  daily.      Coenzyme Q10 (CO Q 10 PO) Take by mouth daily.     denosumab (PROLIA) 60 MG/ML SOSY injection Inject 60 mg into the skin every 6 (six) months.     dicyclomine (BENTYL) 20 MG tablet TAKE 1 TABLET BY MOUTH EVERY 6 HOURS AS NEEDED FOR PAIN 360 tablet 0   ELIQUIS 5 MG TABS tablet TAKE 1 TABLET BY MOUTH TWICE A DAY 60 tablet 5   fexofenadine (ALLEGRA) 180 MG tablet Take 1 tablet (180 mg total) by mouth daily. 30 tablet 11   furosemide (LASIX) 40 MG tablet Take 1 tablet (40 mg total) by mouth daily. Take an extra 20 mg daily prn 135 tablet 1   hydroxypropyl methylcellulose / hypromellose (ISOPTO TEARS / GONIOVISC) 2.5 % ophthalmic solution 1 drop.     Lactobacillus (PROBIOTIC ACIDOPHILUS PO) Take 1 capsule by mouth daily.     latanoprost (XALATAN) 0.005 % ophthalmic solution Place 1 drop into both eyes daily.     losartan (COZAAR) 25 MG tablet TAKE 1 TABLET (25 MG TOTAL) BY MOUTH DAILY. 90 tablet 3   metoprolol tartrate (LOPRESSOR) 25 MG tablet TAKE 2 TABLETS EVERY MORNING AND 1 TABLET IN THE EVENING 270 tablet 3   omeprazole (PRILOSEC) 20 MG capsule TAKE 1 CAPSULE BY MOUTH EVERY DAY 90 capsule 3   rosuvastatin (CRESTOR) 10 MG tablet TAKE 1 TABLET BY MOUTH EVERY DAY 90 tablet 3   traMADol (ULTRAM) 50 MG tablet Take 1 tablet (50 mg total) by mouth every 6 (six) hours as needed. Dr.Ramos (Patient taking differently: Take 50 mg by mouth every 6 (six)  hours as needed. Dr.Ramos; pt takes 1 tablet every night, maybe 1 during the day as needed) 30 tablet 0   fluticasone (FLONASE) 50 MCG/ACT nasal spray Place 2 sprays into both nostrils daily. (Patient not taking: Reported on 07/13/2021) 16 g 6   montelukast (SINGULAIR) 10 MG tablet TAKE 1 TABLET BY MOUTH EVERYDAY AT BEDTIME (Patient not taking: Reported on 07/13/2021) 90 tablet 1   No current facility-administered medications for this visit.     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 s/p LIMA-LAD   Diverticulitis 2002   based on CT scan   Diverticulosis    Dr Henrene Pastor   DVT (deep venous thrombosis) ([place]) 2000   no trigger   Endometriosis    Gastritis 2006   @ End[facility]   GERD (gastroesophageal reflux disease)    Hiatal hernia    Hyperlipidemia    Hypertension    IBS (irritable bowel syndrome)    Ocular hypertension    glaucoma suspect, Dr. Kathrin Penner   Skin cancer    Superficial phlebitis     X 2, 1999, 2000   Syncope 11/12/11   in context CAP . Carotid Doppler exam was negative. 2D echocardiogram revealed mild dilation of the left atrium and moderate increase in the systolic pressureof  the pulmonary artery   UTI (lower urinary tract infection) 01/2013   Strep bovis ; PCN sensitive    Past Surgical History:  Procedure Laterality Date   CARDIAC CATHETERIZATION N/A 03/28/2015   Procedure: Left Heart Cath and Coronary Angiography;  Surgeon: Jolaine Artist, MD;  Location: [facility];  Service: Cardiovascular;  Laterality: N/A;   CATARACT EXTRACTION Bilateral    CATARACT EXTRACTION W/ INTRAOCULAR LENS IMPLANT     OS; Dr Kathrin Penner   COLONOSCOPY  1995, 2002, 2012   diverticulosis; Dr Henrene Pastor   CORONARY ARTERY BYPASS GRAFT N/A 04/01/2015   Procedure: CORONARY ARTERY BYPASS GRAFTING (CABG), ON PUMP, TIMES ONE, USING LEFT INTERNAL MAMMARY ARTERY;  Surgeon: Ivin Poot, MD;  Location: [facility];  Service: Open Heart Surgery;  Laterality: N/A;   EXCISION OF ATRIAL MYXOMA N/A 04/01/2015   Procedure: EXCISION OF ATRIAL MYXOMA;  Surgeon: Ivin Poot, MD;  Location: [facility];  Service: Open Heart Surgery;  Laterality: N/A;   ORIF ANKLE FRACTURE  11/14/2011   Procedure: OPEN REDUCTION INTERNAL FIXATION (ORIF) ANKLE FRACTURE;  Surgeon: Colin Rhein;  Location: [facility];   Service: Orthopedics;  Laterality: Left;  open reduction internal fixation trimalleolar ankle fracture   TEE WITHOUT CARDIOVERSION N/A 04/01/2015   Procedure: TRANSESOPHAGEAL ECHOCARDIOGRAM (TEE);  Surgeon: Ivin Poot, MD;  Location: [facility];  Service: Open Heart Surgery;  Laterality: N/A;   TONSILLECTOMY     TOTAL ABDOMINAL HYSTERECTOMY W/ BILATERAL SALPINGOOPHORECTOMY  1980   dysfunctional menses, endometriosis   VARICOSE VEIN SURGERY     1960, 1965, 2009    Social History   Socioeconomic History   Marital status: Married    Spouse name: Not on file   Number of children: 2   Years of education: Not on file   Highest education level: Not on file  Occupational History   Occupation: Retired    Fish farm manager:  RETIRED  Tobacco Use   Smoking status: Never   Smokeless tobacco: Never  Vaping Use   Vaping Use: Never used  Substance and Sexual Activity   Alcohol use: No   Drug use: No   Sexual activity: Not on file  Other Topics Concern   Not on file  Social History Narrative   REG EXERCISE   HAD COLONOSCOPY AND ENDOSCOPY DONE DATES UNKNOWN   Social Determinants of Health   Financial Resource Strain: Low Risk    Difficulty of Paying Living Expenses: Not very hard  Food Insecurity: Not on file  Transportation Needs: Not on file  Physical Activity: Not on file  Stress: Not on file  Social Connections: Not on file  Intimate Partner Violence: Not on file    Family History  Problem Relation Age of Onset   Heart attack Father 64   Alzheimer's disease Mother 87       TIAs   Colon cancer Maternal Grandmother    Heart attack Paternal Grandmother 57   Hyperlipidemia Brother    Hypertension Brother    Diverticulitis Daughter        S/P colectomy   Diabetes Neg Hx    Stroke Neg Hx    Hypercalcemia Neg Hx    Stomach cancer Neg Hx    Pancreatic cancer Neg Hx    Esophageal cancer Neg Hx     ROS: no fevers or chills, productive cough, hemoptysis, dysphasia, odynophagia,  melena, hematochezia, dysuria, hematuria, rash, seizure activity, orthopnea, PND, pedal edema, claudication. Remaining systems are negative.  Physical Exam: Well-developed well-nourished in no acute distress.  Skin is warm and dry.  HEENT is normal.  Neck is supple.  Chest is clear to auscultation with normal expansion.  Cardiovascular exam is regular rate and rhythm.  Abdominal exam nontender or distended. No masses palpated. Extremities show trace edema. neuro grossly intact  ECG-atrial flutter, right bundle branch block.  Personally reviewed  A/P  1 atrial fibrillation/flutter-continue beta-blocker at present dose for rate control.  Note her heart rate is mildly elevated and she is in atrial flutter today.  However her daughter states that occasionally her heart rate dips into the 40s.  I am therefore hesitant to either increase or decrease metoprolol.  We will follow heart rate and adjust as needed.  Continue apixaban.    2 history of atrial myxoma-status postresection.  No evidence of recurrence on most recent echocardiogram.  3 hypertension-blood pressure elevated; increase losartan to 50 mg daily and follow.  Can further advance to 100 mg daily if needed pending follow-up results.  4 hyperlipidemia-continue statin.  5 coronary artery disease-patient denies chest pain.  Continue statin.  She is not on aspirin given need for apixaban.  6 peripheral edema-continue Lasix at present dose.  7 severe tricuspid regurgitation-she will need follow-up echoes in the future.  However given age would like to be conservative if possible.  Kirk Ruths, MD

## 2021-07-08 ENCOUNTER — Telehealth: Payer: Self-pay | Admitting: Pharmacist

## 2021-07-08 NOTE — Progress Notes (Signed)
Chronic Care Management Pharmacy Assistant   Name: Valerie Rollins  MRN: [id number] DOB: October 12, 1936   Reason for Encounter: Disease State   Conditions to be addressed/monitored: HTN   Recent office visits:  None ID  Recent consult visits:  None ID  Hospital visits:  None in previous 6 months  Medications: Outpatient Encounter Medications as of 07/08/2021  Medication Sig   albuterol (VENTOLIN HFA) 108 (90 Base) MCG/ACT inhaler Inhale 2 puffs into the lungs every 6 (six) hours as needed for wheezing or shortness of breath.   amoxicillin (AMOXIL) 500 MG capsule Take 1,000 mg by mouth 2 (two) times daily.   Ascorbic Acid (VITAMIN C) 100 MG tablet Take 100 mg by mouth daily.   augmented betamethasone dipropionate (DIPROLENE-AF) 0.05 % cream Apply topically as directed.   benzoyl peroxide 5 % gel Apply topically 2 (two) times daily.   calcium citrate (CALCITRATE - DOSED IN MG ELEMENTAL CALCIUM) 950 (200 Ca) MG tablet Take 200 mg of elemental calcium by mouth 2 (two) times daily.   Cholecalciferol (VITAMIN D3) 1000 UNITS CAPS Take 1,000 Units by mouth daily.    Coenzyme Q10 (CO Q 10 PO) Take by mouth daily.   denosumab (PROLIA) 60 MG/ML SOSY injection Inject 60 mg into the skin every 6 (six) months.   dicyclomine (BENTYL) 20 MG tablet TAKE 1 TABLET BY MOUTH EVERY 6 HOURS AS NEEDED FOR PAIN   ELIQUIS 5 MG TABS tablet TAKE 1 TABLET BY MOUTH TWICE A DAY   fexofenadine (ALLEGRA) 180 MG tablet Take 1 tablet (180 mg total) by mouth daily.   fluticasone (FLONASE) 50 MCG/ACT nasal spray Place 2 sprays into both nostrils daily.   furosemide (LASIX) 40 MG tablet Take 1 tablet (40 mg total) by mouth daily. Take an extra 20 mg daily prn   hydroxypropyl methylcellulose / hypromellose (ISOPTO TEARS / GONIOVISC) 2.5 % ophthalmic solution 1 drop.   Lactobacillus (PROBIOTIC ACIDOPHILUS PO) Take 1 capsule by mouth daily.   latanoprost (XALATAN) 0.005 % ophthalmic solution Place 1 drop into both eyes  daily.   losartan (COZAAR) 25 MG tablet TAKE 1 TABLET (25 MG TOTAL) BY MOUTH DAILY.   metoprolol tartrate (LOPRESSOR) 25 MG tablet TAKE 2 TABLETS EVERY MORNING AND 1 TABLET IN THE EVENING   montelukast (SINGULAIR) 10 MG tablet TAKE 1 TABLET BY MOUTH EVERYDAY AT BEDTIME   omeprazole (PRILOSEC) 20 MG capsule TAKE 1 CAPSULE BY MOUTH EVERY DAY   rosuvastatin (CRESTOR) 10 MG tablet TAKE 1 TABLET BY MOUTH EVERY DAY   traMADol (ULTRAM) 50 MG tablet Take 1 tablet (50 mg total) by mouth every 6 (six) hours as needed. Dr.Ramos (Patient taking differently: Take 50 mg by mouth every 6 (six) hours as needed. Dr.Ramos; pt takes 1 tablet every night, maybe 1 during the day as needed)   No facility-administered encounter medications on file as of 07/08/2021.    Recent Office Vitals: BP Readings from Last 3 Encounters:  04/21/21 (!) 144/69  02/02/21 120/76  01/26/21 126/64   Pulse Readings from Last 3 Encounters:  04/21/21 (!) 45  02/02/21 (!) 58  01/26/21 86    Wt Readings from Last 3 Encounters:  04/21/21 152 lb (68.9 kg)  02/02/21 154 lb (69.9 kg)  01/26/21 157 lb 9.6 oz (71.5 kg)     Kidney Function Lab Results  Component Value Date/Time   CREATININE 0.87 02/02/2021 12:27 PM   CREATININE 0.87 01/26/2021 01:54 PM   CREATININE 0.93 (H) 08/01/2020 03:50 PM  CREATININE 0.94 (H) 01/27/2016 11:59 AM   GFR 62.01 02/02/2021 12:27 PM   GFRNONAA 58 (L) 08/01/2020 03:50 PM   GFRAA 67 08/01/2020 03:50 PM    BMP Latest Ref Rng & Units 02/02/2021 01/26/2021 08/01/2020  Glucose 70 - 99 mg/dL 85 89 93  BUN 6 - 23 mg/dL '23 24 23  '$ Creatinine 0.40 - 1.20 mg/dL 0.87 0.87 0.93(H)  BUN/Creat Ratio 12 - 28 - 28 25(H)  Sodium 135 - 145 mEq/L 142 144 142  Potassium 3.5 - 5.1 mEq/L 4.5 3.8 4.9  Chloride 96 - 112 mEq/L 104 102 101  CO2 19 - 32 mEq/L '30 25 31  '$ Calcium 8.4 - 10.5 mg/dL 10.4 9.8 10.6(H)     Contacted patient on 07/08/21 to discuss hypertension disease state  Current antihypertensive regimen:   Metoprolol tartrate 25 mg 2 tab daily Losartan 25 mg 1 tab daily Furosemide 40 mg daily  Patient verbally confirms she is taking the above medications as directed. Yes  How often are you checking your Blood Pressure? daily, but does not check at the same day everyday  she checks her blood pressure in the morning before taking her medication.  Current home BP readings: None this week  DATE:             BP               PULSE  06/30/21  186/79  47  07/01/21 172/81    57    Wrist or arm cuff:Use arm cuff Caffeine intake: No caffeine Salt intake:very little salt on eggs in the morning OTC medications including pseudoephedrine or NSAIDs?  Any readings above 180/120? Yes If yes any symptoms of hypertensive emergency? patient denies any symptoms of high blood pressure   What recent interventions/DTPs have been made by any provider to improve Blood Pressure control since last CPP Visit: None noted  Any recent hospitalizations or ED visits since last visit with CPP? No  What diet changes have been made to improve Blood Pressure Control?  Patient states that she watches what she eats, more vegetables and fruits  What exercise is being done to improve your Blood Pressure Control?  Patient states that she does not exercise  Adherence Review: Is the patient currently on ACE/ARB medication? Yes Does the patient have >5 day gap between last estimated fill dates? No   Star Rating Drugs:  Medication:  Last Fill: Day Supply Losartan 25 mg 06/28/21  90 Rosuvastatin 10 mg 06/11/21 90  Care Gaps: Last annual wellness visit:01/21/20   PCP appointment on 08/03/21    Ethelene Hal Clinical Pharmacist Assistant [phone]   Time spent:40

## 2021-07-13 ENCOUNTER — Encounter: Payer: Self-pay | Admitting: Cardiology

## 2021-07-13 ENCOUNTER — Other Ambulatory Visit: Payer: Self-pay

## 2021-07-13 ENCOUNTER — Ambulatory Visit: Payer: Medicare HMO | Admitting: Cardiology

## 2021-07-13 VITALS — BP 132/78 | HR 109 | Ht 64.0 in | Wt 148.0 lb

## 2021-07-13 DIAGNOSIS — I10 Essential (primary) hypertension: Secondary | ICD-10-CM | POA: Diagnosis not present

## 2021-07-13 DIAGNOSIS — E7800 Pure hypercholesterolemia, unspecified: Secondary | ICD-10-CM | POA: Diagnosis not present

## 2021-07-13 DIAGNOSIS — I2510 Atherosclerotic heart disease of native coronary artery without angina pectoris: Secondary | ICD-10-CM

## 2021-07-13 DIAGNOSIS — D151 Benign neoplasm of heart: Secondary | ICD-10-CM

## 2021-07-13 DIAGNOSIS — I4821 Permanent atrial fibrillation: Secondary | ICD-10-CM

## 2021-07-13 MED ORDER — LOSARTAN POTASSIUM 50 MG PO TABS: 50.0000 mg | ORAL_TABLET | Freq: Every day | ORAL | 3 refills | Status: DC

## 2021-07-13 NOTE — Patient Instructions (Signed)
Medication Instructions:   INCREASE LOSARTAN TO 50 MG ONCE DAILY= 2 OF THE 25 MG TABLETS ONCE DAILY  *If you need a refill on your cardiac medications before your next appointment, please call your pharmacy*   Lab Work:  Your physician recommends that you return for lab work in: [place]  If you have labs (blood work) drawn today and your tests are completely normal, you will receive your results only by: [employer] (if you have MyChart) OR A paper copy in the mail If you have any lab test that is abnormal or we need to change your treatment, we will call you to review the results.  Follow-Up: At [facility], you and your health needs are our priority.  As part of our continuing mission to provide you with exceptional heart care, we have created designated Provider Care Teams.  These Care Teams include your primary Cardiologist (physician) and Advanced Practice Providers (APPs -  Physician Assistants and Nurse Practitioners) who all work together to provide you with the care you need, when you need it.  We recommend signing up for the patient portal called "MyChart".  Sign up information is provided on this After Visit Summary.  MyChart is used to connect with patients for Virtual Visits (Telemedicine).  Patients are able to view lab/test results, encounter notes, upcoming appointments, etc.  Non-urgent messages can be sent to your provider as well.   To learn more about what you can do with MyChart, go to [email or web address].    Your next appointment:   12 month(s)  The format for your next appointment:   In Person  Provider:   Kirk Ruths, MD

## 2021-07-21 DIAGNOSIS — I10 Essential (primary) hypertension: Secondary | ICD-10-CM | POA: Diagnosis not present

## 2021-07-21 LAB — BASIC METABOLIC PANEL WITH GFR
BUN/Creatinine Ratio: 33 — ABNORMAL HIGH (ref 12–28)
BUN: 29 mg/dL — ABNORMAL HIGH (ref 8–27)
CO2: 26 mmol/L (ref 20–29)
Calcium: 10.1 mg/dL (ref 8.7–10.3)
Chloride: 102 mmol/L (ref 96–106)
Creatinine, Ser: 0.87 mg/dL (ref 0.57–1.00)
Glucose: 89 mg/dL (ref 65–99)
Potassium: 4.6 mmol/L (ref 3.5–5.2)
Sodium: 140 mmol/L (ref 134–144)
eGFR: 66 mL/min/1.73

## 2021-07-22 ENCOUNTER — Telehealth: Payer: Self-pay | Admitting: Internal Medicine

## 2021-07-22 NOTE — Telephone Encounter (Signed)
LVM for pt to rtn my call to schedule AWV with [facility]. Please schedule this appt if pt calls the office.  °

## 2021-07-31 ENCOUNTER — Encounter: Payer: Self-pay | Admitting: Internal Medicine

## 2021-08-02 NOTE — Assessment & Plan Note (Addendum)
Chronic Last prolia 02/23/21 Need to continue Q 6 prolia injection due to high risk of fracture Continue calcium and vitamin D Will check vitamin D level Encouraged regular exercise

## 2021-08-02 NOTE — Progress Notes (Signed)
Subjective:    Patient ID: Valerie Rollins, female    DOB: Feb 13, 1936, 85 y.o.   MRN: [id number]   This visit occurred during the SARS-CoV-2 public health emergency.  Safety protocols were in place, including screening questions prior to the visit, additional usage of staff PPE, and extensive cleaning of exam room while observing appropriate contact time as indicated for disinfecting solutions.    HPI She is here for a physical exam.    Last prolia 02/23/21.  She is due again next month and her appointment is scheduled.   Rash under left breast -she has an intermittent rash under her breasts and has always used Lotrisone which has always helped.  She is using one that is just expired and was concerned whether it was working or not.    Medications and allergies reviewed with patient and updated if appropriate.  Patient Active Problem List   Diagnosis Date Noted   Thickened nails 02/02/2021   Chronic venous stasis dermatitis of both lower extremities 01/21/2020   Hypercalcemia 07/19/2019   CKD (chronic kidney disease) stage 3, GFR 30-59 ml/min 07/19/2019   Candidal intertrigo 04/20/2018   Angular stomatitis 04/20/2018   Seasonal allergic rhinitis due to pollen 03/21/2018   Bilateral leg edema 12/31/2016   Prediabetes 01/10/2016   Osteoporosis - prolia Q 6 months 12/26/2015   Mass of neck 07/07/2015   Long-term (current) use of anticoagulants 04/16/2015   S/P CABG x 1 04/01/2015   CAD (coronary artery disease) 03/28/2015   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   Cardiomegaly 02/20/2015   DDD (degenerative disc disease), lumbar 07/20/2012   Atrial fibrillation ([place]) 11/15/2011   Syncope 11/12/2011   RBBB (right bundle branch block) 11/12/2011   SKIN CANCER, HX OF 11/18/2009   Diverticulosis of large intestine 10/31/2008   VARICOSE VEINS, LOWER EXTREMITIES 03/13/2008   GERD 02/20/2008   IBS 02/20/2008   Osteoarthritis 02/20/2008   Hyperlipidemia 12/01/2006   Essential hypertension  12/01/2006   SUPERFICIAL THROMBOPHLEBITIS 12/01/2006   DVT, HX OF 12/01/2006    Current Outpatient Medications on File Prior to Visit  Medication Sig Dispense Refill   albuterol (VENTOLIN HFA) 108 (90 Base) MCG/ACT inhaler Inhale 2 puffs into the lungs every 6 (six) hours as needed for wheezing or shortness of breath. 6.7 g 5   Ascorbic Acid (VITAMIN C) 100 MG tablet Take 100 mg by mouth daily.     augmented betamethasone dipropionate (DIPROLENE-AF) 0.05 % cream Apply topically as directed.     benzoyl peroxide 5 % gel Apply topically 2 (two) times daily.     calcium citrate (CALCITRATE - DOSED IN MG ELEMENTAL CALCIUM) 950 (200 Ca) MG tablet Take 200 mg of elemental calcium by mouth 2 (two) times daily.     Cholecalciferol (VITAMIN D3) 1000 UNITS CAPS Take 1,000 Units by mouth daily.      Coenzyme Q10 (CO Q 10 PO) Take by mouth daily.     denosumab (PROLIA) 60 MG/ML SOSY injection Inject 60 mg into the skin every 6 (six) months.     dicyclomine (BENTYL) 20 MG tablet TAKE 1 TABLET BY MOUTH EVERY 6 HOURS AS NEEDED FOR PAIN 360 tablet 0   ELIQUIS 5 MG TABS tablet TAKE 1 TABLET BY MOUTH TWICE A DAY 60 tablet 5   fluticasone (FLONASE) 50 MCG/ACT nasal spray Place 2 sprays into both nostrils daily. 16 g 6   furosemide (LASIX) 40 MG tablet Take 1 tablet (40 mg total) by mouth daily. Take an extra  20 mg daily prn 135 tablet 1   hydroxypropyl methylcellulose / hypromellose (ISOPTO TEARS / GONIOVISC) 2.5 % ophthalmic solution 1 drop.     Lactobacillus (PROBIOTIC ACIDOPHILUS PO) Take 1 capsule by mouth daily.     latanoprost (XALATAN) 0.005 % ophthalmic solution Place 1 drop into both eyes daily.     losartan (COZAAR) 50 MG tablet Take 1 tablet (50 mg total) by mouth daily. 90 tablet 3   metoprolol tartrate (LOPRESSOR) 25 MG tablet TAKE 2 TABLETS EVERY MORNING AND 1 TABLET IN THE EVENING 270 tablet 3   montelukast (SINGULAIR) 10 MG tablet TAKE 1 TABLET BY MOUTH EVERYDAY AT BEDTIME 90 tablet 1    omeprazole (PRILOSEC) 20 MG capsule TAKE 1 CAPSULE BY MOUTH EVERY DAY 90 capsule 3   rosuvastatin (CRESTOR) 10 MG tablet TAKE 1 TABLET BY MOUTH EVERY DAY 90 tablet 3   traMADol (ULTRAM) 50 MG tablet Take 1 tablet (50 mg total) by mouth every 6 (six) hours as needed. Dr.Ramos (Patient taking differently: Take 50 mg by mouth every 6 (six) hours as needed. Dr.Ramos; pt takes 1 tablet every night, maybe 1 during the day as needed) 30 tablet 0   No current facility-administered medications on file prior to visit.    Past Medical History:  Diagnosis Date   Ankle fracture, left 11/12/2011   Atrial fibrillation ([place])    Post op after repair of ankle fracture   Atrial myxoma 03/2015   Resected at the time of CABG with LAA ligation   CAD (coronary artery disease) 03/2015   LAD 80%, otherwise minimal CAD. s/p LIMA-LAD   Diverticulitis 2002   based on CT scan   Diverticulosis    Dr Henrene Pastor   DVT (deep venous thrombosis) ([place]) 2000   no trigger   Endometriosis    Gastritis 2006   @ End[facility]   GERD (gastroesophageal reflux disease)    Hiatal hernia    Hyperlipidemia    Hypertension    IBS (irritable bowel syndrome)    Ocular hypertension    glaucoma suspect, Dr. Kathrin Penner   Skin cancer    Superficial phlebitis     X 2, 1999, 2000   Syncope 11/12/11   in context CAP . Carotid Doppler exam was negative. 2D echocardiogram revealed mild dilation of the left atrium and moderate increase in the systolic pressureof  the pulmonary artery   UTI (lower urinary tract infection) 01/2013   Strep bovis ; PCN sensitive    Past Surgical History:  Procedure Laterality Date   CARDIAC CATHETERIZATION N/A 03/28/2015   Procedure: Left Heart Cath and Coronary Angiography;  Surgeon: Jolaine Artist, MD;  Location: [facility];  Service: Cardiovascular;  Laterality: N/A;   CATARACT EXTRACTION Bilateral    CATARACT EXTRACTION W/ INTRAOCULAR LENS IMPLANT     OS; Dr Kathrin Penner   COLONOSCOPY  1995,  2002, 2012   diverticulosis; Dr Henrene Pastor   CORONARY ARTERY BYPASS GRAFT N/A 04/01/2015   Procedure: CORONARY ARTERY BYPASS GRAFTING (CABG), ON PUMP, TIMES ONE, USING LEFT INTERNAL MAMMARY ARTERY;  Surgeon: Ivin Poot, MD;  Location: [facility];  Service: Open Heart Surgery;  Laterality: N/A;   EXCISION OF ATRIAL MYXOMA N/A 04/01/2015   Procedure: EXCISION OF ATRIAL MYXOMA;  Surgeon: Ivin Poot, MD;  Location: [facility];  Service: Open Heart Surgery;  Laterality: N/A;   ORIF ANKLE FRACTURE  11/14/2011   Procedure: OPEN REDUCTION INTERNAL FIXATION (ORIF) ANKLE FRACTURE;  Surgeon: Valerie Rhein;  Location: [facility];  Service: Orthopedics;  Laterality: Left;  open reduction internal fixation trimalleolar ankle fracture   TEE WITHOUT CARDIOVERSION N/A 04/01/2015   Procedure: TRANSESOPHAGEAL ECHOCARDIOGRAM (TEE);  Surgeon: Ivin Poot, MD;  Location: [facility];  Service: Open Heart Surgery;  Laterality: N/A;   TONSILLECTOMY     TOTAL ABDOMINAL HYSTERECTOMY W/ BILATERAL SALPINGOOPHORECTOMY  1980   dysfunctional menses, endometriosis   VARICOSE VEIN SURGERY     1960, 1965, 2009    Social History   Socioeconomic History   Marital status: Married    Spouse name: Not on file   Number of children: 2   Years of education: Not on file   Highest education level: Not on file  Occupational History   Occupation: Retired    Fish farm manager: RETIRED  Tobacco Use   Smoking status: Never   Smokeless tobacco: Never  Vaping Use   Vaping Use: Never used  Substance and Sexual Activity   Alcohol use: No   Drug use: No   Sexual activity: Not on file  Other Topics Concern   Not on file  Social History Narrative   REG EXERCISE   HAD COLONOSCOPY AND ENDOSCOPY DONE DATES UNKNOWN   Social Determinants of Health   Financial Resource Strain: Low Risk    Difficulty of Paying Living Expenses: Not very hard  Food Insecurity: Not on file  Transportation Needs: Not on file  Physical Activity: Not on file  Stress: Not  on file  Social Connections: Not on file    Family History  Problem Relation Age of Onset   Heart attack Father 31   Alzheimer's disease Mother 62       TIAs   Colon cancer Maternal Grandmother    Heart attack Paternal Grandmother 26   Hyperlipidemia Brother    Hypertension Brother    Diverticulitis Daughter        S/P colectomy   Diabetes Neg Hx    Stroke Neg Hx    Hypercalcemia Neg Hx    Stomach cancer Neg Hx    Pancreatic cancer Neg Hx    Esophageal cancer Neg Hx     Review of Systems  Constitutional:  Negative for chills and fever.  HENT:  Positive for postnasal drip (occ).   Eyes:  Negative for visual disturbance.  Respiratory:  Positive for shortness of breath (with moderate exertion) and wheezing (occ). Negative for cough.   Cardiovascular:  Positive for leg swelling (chronic). Negative for chest pain and palpitations.  Gastrointestinal:  Positive for constipation. Negative for abdominal pain, blood in stool, diarrhea and nausea.       No gerd  Genitourinary:  Negative for dysuria.  Musculoskeletal:  Positive for back pain (occ) and neck pain.  Skin:  Positive for rash (under left breast).  Neurological:  Negative for dizziness, light-headedness and headaches.  Psychiatric/Behavioral:  Negative for dysphoric mood. The patient is not nervous/anxious.       Objective:   Vitals:   08/03/21 1257  BP: 130/60  Pulse: (!) 51  Temp: 98.3 F (36.8 C)  SpO2: 97%   Filed Weights   08/03/21 1257  Weight: 147 lb (66.7 kg)   Body mass index is 25.23 kg/m.  BP Readings from Last 3 Encounters:  08/03/21 130/60  07/13/21 132/78  04/21/21 (!) 144/69    Wt Readings from Last 3 Encounters:  08/03/21 147 lb (66.7 kg)  07/13/21 148 lb (67.1 kg)  04/21/21 152 lb (68.9 kg)     Physical  Exam Constitutional: She appears well-developed and well-nourished. No distress.  HENT:  Head: Normocephalic and atraumatic.  Right Ear: External ear normal. Normal ear canal and  TM Left Ear: External ear normal.  Normal ear canal and TM Mouth/Throat: Oropharynx is clear and moist.  Eyes: Conjunctivae and EOM are normal.  Neck: Neck supple. No tracheal deviation present. No thyromegaly present.  No carotid bruit  Cardiovascular: Normal rate, regular rhythm and normal heart sounds.   No murmur heard.  No edema. Pulmonary/Chest: Effort normal and breath sounds normal. No respiratory distress. She has no wheezes. She has no rales.  Breast: deferred   Abdominal: Soft. She exhibits no distension. There is no tenderness.  Lymphadenopathy: She has no cervical adenopathy.  Skin: Skin is warm and dry. She is not diaphoretic.  No rash under left breast-resolved Psychiatric: She has a normal mood and affect. Her behavior is normal.     Lab Results  Component Value Date   WBC 6.1 02/02/2021   HGB 14.4 02/02/2021   HCT 41.8 02/02/2021   PLT 194.0 02/02/2021   GLUCOSE 89 07/21/2021   CHOL 152 02/02/2021   TRIG 53.0 02/02/2021   HDL 83.70 02/02/2021   LDLDIRECT 152.6 07/31/2013   LDLCALC 58 02/02/2021   ALT 40 (H) 02/02/2021   AST 29 02/02/2021   NA 140 07/21/2021   K 4.6 07/21/2021   CL 102 07/21/2021   CREATININE 0.87 07/21/2021   BUN 29 (H) 07/21/2021   CO2 26 07/21/2021   TSH 2.34 08/01/2020   INR 1.4 07/31/2015   HGBA1C 5.9 02/02/2021         Assessment & Plan:   Physical exam: Screening blood work  ordered Exercise  none-encouraged regular exercise Weight  normal  Substance abuse  none   Reviewed recommended immunizations.   Health Maintenance  Topic Date Due   COVID-19 Vaccine (3 - Pfizer risk series) 01/31/2020   DEXA SCAN  02/24/2022   TETANUS/TDAP  11/29/2026   INFLUENZA VACCINE  Completed   PNA vac Low Risk Adult  Completed   Zoster Vaccines- Shingrix  Completed   HPV VACCINES  Aged Out          See Problem List for Assessment and Plan of chronic medical problems.

## 2021-08-03 ENCOUNTER — Ambulatory Visit (INDEPENDENT_AMBULATORY_CARE_PROVIDER_SITE_OTHER): Payer: Medicare HMO | Admitting: Internal Medicine

## 2021-08-03 ENCOUNTER — Other Ambulatory Visit: Payer: Self-pay

## 2021-08-03 ENCOUNTER — Encounter: Payer: Self-pay | Admitting: Internal Medicine

## 2021-08-03 VITALS — BP 130/60 | HR 51 | Temp 98.3°F | Ht 64.0 in | Wt 147.0 lb

## 2021-08-03 DIAGNOSIS — B372 Candidiasis of skin and nail: Secondary | ICD-10-CM

## 2021-08-03 DIAGNOSIS — I10 Essential (primary) hypertension: Secondary | ICD-10-CM

## 2021-08-03 DIAGNOSIS — N1831 Chronic kidney disease, stage 3a: Secondary | ICD-10-CM

## 2021-08-03 DIAGNOSIS — I4891 Unspecified atrial fibrillation: Secondary | ICD-10-CM | POA: Diagnosis not present

## 2021-08-03 DIAGNOSIS — R7303 Prediabetes: Secondary | ICD-10-CM

## 2021-08-03 DIAGNOSIS — Z0000 Encounter for general adult medical examination without abnormal findings: Secondary | ICD-10-CM

## 2021-08-03 DIAGNOSIS — Z23 Encounter for immunization: Secondary | ICD-10-CM

## 2021-08-03 DIAGNOSIS — I2510 Atherosclerotic heart disease of native coronary artery without angina pectoris: Secondary | ICD-10-CM | POA: Diagnosis not present

## 2021-08-03 DIAGNOSIS — M810 Age-related osteoporosis without current pathological fracture: Secondary | ICD-10-CM | POA: Diagnosis not present

## 2021-08-03 DIAGNOSIS — K219 Gastro-esophageal reflux disease without esophagitis: Secondary | ICD-10-CM

## 2021-08-03 DIAGNOSIS — I2583 Coronary atherosclerosis due to lipid rich plaque: Secondary | ICD-10-CM

## 2021-08-03 DIAGNOSIS — E782 Mixed hyperlipidemia: Secondary | ICD-10-CM

## 2021-08-03 LAB — COMPREHENSIVE METABOLIC PANEL
ALT: 27 U/L (ref 0–35)
AST: 30 U/L (ref 0–37)
Albumin: 4.5 g/dL (ref 3.5–5.2)
Alkaline Phosphatase: 49 U/L (ref 39–117)
BUN: 24 mg/dL — ABNORMAL HIGH (ref 6–23)
CO2: 29 mEq/L (ref 19–32)
Calcium: 10.4 mg/dL (ref 8.4–10.5)
Chloride: 102 mEq/L (ref 96–112)
Creatinine, Ser: 0.83 mg/dL (ref 0.40–1.20)
GFR: 65.39 mL/min (ref >60.00)
Glucose, Bld: 90 mg/dL (ref 70–99)
Potassium: 3.4 mEq/L — ABNORMAL LOW (ref 3.5–5.1)
Sodium: 142 mEq/L (ref 135–145)
Total Bilirubin: 0.8 mg/dL (ref 0.2–1.2)
Total Protein: 7.3 g/dL (ref 6.0–8.3)

## 2021-08-03 LAB — LIPID PANEL
Cholesterol: 158 mg/dL (ref 0–200)
HDL: 88.9 mg/dL (ref >39.00)
LDL Cholesterol: 59 mg/dL (ref 0–99)
NonHDL: 69.42
Total CHOL/HDL Ratio: 2
Triglycerides: 54 mg/dL (ref 0.0–149.0)
VLDL: 10.8 mg/dL (ref 0.0–40.0)

## 2021-08-03 LAB — CBC WITH DIFFERENTIAL/PLATELET
Basophils Absolute: 0 10*3/uL (ref 0.0–0.1)
Basophils Relative: 0.7 % (ref 0.0–3.0)
Eosinophils Absolute: 0.1 10*3/uL (ref 0.0–0.7)
Eosinophils Relative: 1.8 % (ref 0.0–5.0)
HCT: 43.3 % (ref 36.0–46.0)
Hemoglobin: 14.6 g/dL (ref 12.0–15.0)
Lymphocytes Relative: 21.5 % (ref 12.0–46.0)
Lymphs Abs: 1.3 10*3/uL (ref 0.7–4.0)
MCHC: 33.8 g/dL (ref 30.0–36.0)
MCV: 88.5 fl (ref 78.0–100.0)
Monocytes Absolute: 0.5 10*3/uL (ref 0.1–1.0)
Monocytes Relative: 9.4 % (ref 3.0–12.0)
Neutro Abs: 3.9 10*3/uL (ref 1.4–7.7)
Neutrophils Relative %: 66.6 % (ref 43.0–77.0)
Platelets: 197 10*3/uL (ref 150.0–400.0)
RBC: 4.89 Mil/uL (ref 3.87–5.11)
RDW: 13.3 % (ref 11.5–15.5)
WBC: 5.8 10*3/uL (ref 4.0–10.5)

## 2021-08-03 LAB — VITAMIN D 25 HYDROXY (VIT D DEFICIENCY, FRACTURES): VITD: 43.05 ng/mL (ref 30.00–100.00)

## 2021-08-03 LAB — TSH: TSH: 3.35 u[IU]/mL (ref 0.35–5.50)

## 2021-08-03 LAB — HEMOGLOBIN A1C: Hgb A1c MFr Bld: 5.8 % (ref 4.6–6.5)

## 2021-08-03 MED ORDER — FEXOFENADINE HCL 180 MG PO TABS: 180.0000 mg | ORAL_TABLET | Freq: Every day | ORAL | 11 refills | Status: AC

## 2021-08-03 MED ORDER — CLOTRIMAZOLE-BETAMETHASONE 1-0.05 % EX CREA: 1.0000 "application " | TOPICAL_CREAM | Freq: Every day | CUTANEOUS | 0 refills | Status: DC

## 2021-08-03 NOTE — Assessment & Plan Note (Signed)
Chronic Check lipid panel  Continue Crestor 10 mg daily Regular exercise and healthy diet encouraged

## 2021-08-03 NOTE — Assessment & Plan Note (Signed)
Chronic Check a1c Low sugar / carb diet Stressed regular exercise  

## 2021-08-03 NOTE — Patient Instructions (Addendum)
Blood work was ordered.     Flu immunization administered today.     Medications changes include :   none  Your prescription(s) have been submitted to your pharmacy. Please take as directed and contact our office if you believe you are having problem(s) with the medication(s).    Please followup in 6 months     Health Maintenance, Female Adopting a healthy lifestyle and getting preventive care are important in promoting health and wellness. Ask your health care provider about: The right schedule for you to have regular tests and exams. Things you can do on your own to prevent diseases and keep yourself healthy. What should I know about diet, weight, and exercise? Eat a healthy diet  Eat a diet that includes plenty of vegetables, fruits, low-fat dairy products, and lean protein. Do not eat a lot of foods that are high in solid fats, added sugars, or sodium. Maintain a healthy weight Body mass index (BMI) is used to identify weight problems. It estimates body fat based on height and weight. Your health care provider can help determine your BMI and help you achieve or maintain a healthy weight. Get regular exercise Get regular exercise. This is one of the most important things you can do for your health. Most adults should: Exercise for at least 150 minutes each week. The exercise should increase your heart rate and make you sweat (moderate-intensity exercise). Do strengthening exercises at least twice a week. This is in addition to the moderate-intensity exercise. Spend less time sitting. Even light physical activity can be beneficial. Watch cholesterol and blood lipids Have your blood tested for lipids and cholesterol at 83 years of age, then have this test every 5 years. Have your cholesterol levels checked more often if: Your lipid or cholesterol levels are high. You are older than 83 years of age. You are at high risk for heart disease. What should I know about cancer  screening? Depending on your health history and family history, you may need to have cancer screening at various ages. This may include screening for: Breast cancer. Cervical cancer. Colorectal cancer. Skin cancer. Lung cancer. What should I know about heart disease, diabetes, and high blood pressure? Blood pressure and heart disease High blood pressure causes heart disease and increases the risk of stroke. This is more likely to develop in people who have high blood pressure readings, are of African descent, or are overweight. Have your blood pressure checked: Every 3-5 years if you are 59-43 years of age. Every year if you are 110 years old or older. Diabetes Have regular diabetes screenings. This checks your fasting blood sugar level. Have the screening done: Once every three years after age 60 if you are at a normal weight and have a low risk for diabetes. More often and at a younger age if you are overweight or have a high risk for diabetes. What should I know about preventing infection? Hepatitis B If you have a higher risk for hepatitis B, you should be screened for this virus. Talk with your health care provider to find out if you are at risk for hepatitis B infection. Hepatitis C Testing is recommended for: Everyone born from [birth year] through 1965. Anyone with known risk factors for hepatitis C. Sexually transmitted infections (STIs) Get screened for STIs, including gonorrhea and chlamydia, if: You are sexually active and are younger than 83 years of age. You are older than 83 years of age and your health care provider tells you that  you are at risk for this type of infection. Your sexual activity has changed since you were last screened, and you are at increased risk for chlamydia or gonorrhea. Ask your health care provider if you are at risk. Ask your health care provider about whether you are at high risk for HIV. Your health care provider may recommend a prescription medicine to  help prevent HIV infection. If you choose to take medicine to prevent HIV, you should first get tested for HIV. You should then be tested every 3 months for as long as you are taking the medicine. Pregnancy If you are about to stop having your period (premenopausal) and you may become pregnant, seek counseling before you get pregnant. Take 400 to 800 micrograms (mcg) of folic acid every day if you become pregnant. Ask for birth control (contraception) if you want to prevent pregnancy. Osteoporosis and menopause Osteoporosis is a disease in which the bones lose minerals and strength with aging. This can result in bone fractures. If you are 56 years old or older, or if you are at risk for osteoporosis and fractures, ask your health care provider if you should: Be screened for bone loss. Take a calcium or vitamin D supplement to lower your risk of fractures. Be given hormone replacement therapy (HRT) to treat symptoms of menopause. Follow these instructions at home: Lifestyle Do not use any products that contain nicotine or tobacco, such as cigarettes, e-cigarettes, and chewing tobacco. If you need help quitting, ask your health care provider. Do not use street drugs. Do not share needles. Ask your health care provider for help if you need support or information about quitting drugs. Alcohol use Do not drink alcohol if: Your health care provider tells you not to drink. You are pregnant, may be pregnant, or are planning to become pregnant. If you drink alcohol: Limit how much you use to 0-1 drink a day. Limit intake if you are breastfeeding. Be aware of how much alcohol is in your drink. In the [place] one drink equals one 12 oz bottle of beer (355 mL), one 5 oz glass of wine (148 mL), or one 1 oz glass of hard liquor (44 mL). General instructions Schedule regular health, dental, and eye exams. Stay current with your vaccines. Tell your health care provider if: You often feel depressed. You  have ever been abused or do not feel safe at home. Summary Adopting a healthy lifestyle and getting preventive care are important in promoting health and wellness. Follow your health care provider's instructions about healthy diet, exercising, and getting tested or screened for diseases. Follow your health care provider's instructions on monitoring your cholesterol and blood pressure. This information is not intended to replace advice given to you by your health care provider. Make sure you discuss any questions you have with your health care provider. Document Revised: 01/16/2021 Document Reviewed: 11/01/2018 Elsevier Patient Education  2022 [employer].

## 2021-08-03 NOTE — Assessment & Plan Note (Signed)
Chronic She is very inactive and does get short of breath with exertion, which is likely related to physical deconditioning more than anything She is up-to-date with cardiology If shortness of breath worsens she will see cardiology

## 2021-08-03 NOTE — Assessment & Plan Note (Signed)
Chronic CMP 

## 2021-08-03 NOTE — Assessment & Plan Note (Signed)
Chronic Under breasts-intermittent Lotrisone has been effective-continue as needed

## 2021-08-03 NOTE — Assessment & Plan Note (Signed)
Chronic GERD controlled Continue omeprazole 20 mg daily  

## 2021-08-03 NOTE — Assessment & Plan Note (Signed)
Chronic Following with cardiology On Eliquis 5 mg twice daily and metoprolol CBC, CMP, TSH

## 2021-08-03 NOTE — Assessment & Plan Note (Signed)
Chronic Blood pressure well controlled Continue losartan 50 mg daily, metoprolol 50 mg in the morning, 25 mg in evening CMP

## 2021-08-04 ENCOUNTER — Ambulatory Visit (INDEPENDENT_AMBULATORY_CARE_PROVIDER_SITE_OTHER): Payer: Medicare HMO

## 2021-08-04 DIAGNOSIS — Z0000 Encounter for general adult medical examination without abnormal findings: Secondary | ICD-10-CM | POA: Diagnosis not present

## 2021-08-04 NOTE — Progress Notes (Signed)
I connected with Valerie Rollins today by telephone and verified that I am speaking with the correct person using two identifiers. Location patient: home Location provider: work Persons participating in the virtual visit: patient, provider.   I discussed the limitations, risks, security and privacy concerns of performing an evaluation and management service by telephone and the availability of in person appointments. I also discussed with the patient that there may be a patient responsible charge related to this service. The patient expressed understanding and verbally consented to this telephonic visit.    Interactive audio and video telecommunications were attempted between this provider and patient, however failed, due to patient having technical difficulties OR patient did not have access to video capability.  We continued and completed visit with audio only.  Some vital signs may be absent or patient reported.   Time Spent with patient on telephone encounter: 30 minutes  Subjective:   Valerie Rollins is a 85 y.o. female who presents for Medicare Annual (Subsequent) preventive examination.  Review of Systems     Cardiac Risk Factors include: advanced age (>2mn, >>75women);family history of premature cardiovascular disease;hypertension;dyslipidemia     Objective:    There were no vitals filed for this visit. There is no height or weight on file to calculate BMI.  Advanced Directives 08/04/2021 01/09/2018 05/20/2015 04/01/2015 11/14/2011 11/12/2011  Does Patient Have a Medical Advance Directive? Yes Yes No Yes Patient has advance directive, copy in chart Patient has advance directive, copy not in chart  Type of Advance Directive Living will;Healthcare Power of [place] will [place] will  Does patient want to make changes to medical advance  directive? No - Patient declined - - No - Patient declined - -  Copy of [place] Chart? No - copy requested No - copy requested - No - copy requested Copy requested from family Copy requested from family  Would patient like information on creating a medical advance directive? - - No - patient declined information - - -  Pre-existing out of facility DNR order (yellow form or pink MOST form) - - - - No -    Current Medications (verified) Outpatient Encounter Medications as of 08/04/2021  Medication Sig   albuterol (VENTOLIN HFA) 108 (90 Base) MCG/ACT inhaler Inhale 2 puffs into the lungs every 6 (six) hours as needed for wheezing or shortness of breath.   Ascorbic Acid (VITAMIN C) 100 MG tablet Take 100 mg by mouth daily.   augmented betamethasone dipropionate (DIPROLENE-AF) 0.05 % cream Apply topically as directed.   benzoyl peroxide 5 % gel Apply topically 2 (two) times daily.   calcium citrate (CALCITRATE - DOSED IN MG ELEMENTAL CALCIUM) 950 (200 Ca) MG tablet Take 200 mg of elemental calcium by mouth 2 (two) times daily.   Cholecalciferol (VITAMIN D3) 1000 UNITS CAPS Take 1,000 Units by mouth daily.    clotrimazole-betamethasone (LOTRISONE) cream Apply 1 application topically daily.   Coenzyme Q10 (CO Q 10 PO) Take by mouth daily.   denosumab (PROLIA) 60 MG/ML SOSY injection Inject 60 mg into the skin every 6 (six) months.   dicyclomine (BENTYL) 20 MG tablet TAKE 1 TABLET BY MOUTH EVERY 6 HOURS AS NEEDED FOR PAIN   ELIQUIS 5 MG TABS tablet TAKE 1 TABLET BY MOUTH TWICE A DAY   fexofenadine (ALLEGRA) 180 MG tablet Take 1 tablet (180 mg total) by mouth daily.  fluticasone (FLONASE) 50 MCG/ACT nasal spray Place 2 sprays into both nostrils daily.   furosemide (LASIX) 40 MG tablet Take 1 tablet (40 mg total) by mouth daily. Take an extra 20 mg daily prn   hydroxypropyl methylcellulose / hypromellose (ISOPTO TEARS / GONIOVISC) 2.5 % ophthalmic solution 1 drop.    Lactobacillus (PROBIOTIC ACIDOPHILUS PO) Take 1 capsule by mouth daily.   latanoprost (XALATAN) 0.005 % ophthalmic solution Place 1 drop into both eyes daily.   losartan (COZAAR) 50 MG tablet Take 1 tablet (50 mg total) by mouth daily.   metoprolol tartrate (LOPRESSOR) 25 MG tablet TAKE 2 TABLETS EVERY MORNING AND 1 TABLET IN THE EVENING   montelukast (SINGULAIR) 10 MG tablet TAKE 1 TABLET BY MOUTH EVERYDAY AT BEDTIME   omeprazole (PRILOSEC) 20 MG capsule TAKE 1 CAPSULE BY MOUTH EVERY DAY   rosuvastatin (CRESTOR) 10 MG tablet TAKE 1 TABLET BY MOUTH EVERY DAY   traMADol (ULTRAM) 50 MG tablet Take 1 tablet (50 mg total) by mouth every 6 (six) hours as needed. Dr.Ramos (Patient taking differently: Take 50 mg by mouth every 6 (six) hours as needed. Dr.Ramos; pt takes 1 tablet every night, maybe 1 during the day as needed)   No facility-administered encounter medications on file as of 08/04/2021.    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 s/p LIMA-LAD   Diverticulitis 2002   based on CT scan   Diverticulosis    Dr Henrene Pastor   DVT (deep venous thrombosis) ([place]) 2000   no trigger   Endometriosis    Gastritis 2006   @ Endo   GERD (gastroesophageal reflux disease)    Hiatal hernia    Hyperlipidemia    Hypertension    IBS (irritable bowel syndrome)    Ocular hypertension    glaucoma suspect, Dr. Kathrin Penner   Skin cancer    Superficial phlebitis     X 2, 1999, 2000   Syncope 11/12/11   in context CAP . Carotid Doppler exam was negative. 2D echocardiogram revealed mild dilation of the left atrium and moderate increase in the  systolic pressureof  the pulmonary artery   UTI (lower urinary tract infection) 01/2013   Strep bovis ; PCN sensitive   Past Surgical History:  Procedure Laterality Date   CARDIAC CATHETERIZATION N/A 03/28/2015   Procedure: Left Heart Cath and Coronary Angiography;  Surgeon: Jolaine Artist, MD;  Location: [facility];  Service: Cardiovascular;  Laterality: N/A;   CATARACT EXTRACTION Bilateral    CATARACT EXTRACTION W/ INTRAOCULAR LENS IMPLANT     OS; Dr Kathrin Penner   COLONOSCOPY  1995, 2002, 2012   diverticulosis; Dr Henrene Pastor   CORONARY ARTERY BYPASS GRAFT N/A 04/01/2015   Procedure: CORONARY ARTERY BYPASS GRAFTING (CABG), ON PUMP, TIMES ONE, USING LEFT INTERNAL MAMMARY ARTERY;  Surgeon: Ivin Poot, MD;  Location: [facility];  Service: Open Heart Surgery;  Laterality: N/A;   EXCISION OF ATRIAL MYXOMA N/A 04/01/2015   Procedure: EXCISION OF ATRIAL MYXOMA;  Surgeon: Ivin Poot, MD;  Location: [facility];  Service: Open Heart Surgery;  Laterality: N/A;   ORIF ANKLE FRACTURE  11/14/2011   Procedure: OPEN REDUCTION INTERNAL FIXATION (ORIF) ANKLE FRACTURE;  Surgeon: Colin Rhein;  Location: [facility];  Service: Orthopedics;  Laterality: Left;  open reduction internal fixation trimalleolar ankle fracture   TEE WITHOUT CARDIOVERSION N/A 04/01/2015   Procedure: TRANSESOPHAGEAL ECHOCARDIOGRAM (TEE);  Surgeon: Ivin Poot, MD;  Location: [facility];  Service: Open Heart Surgery;  Laterality: N/A;   TONSILLECTOMY     TOTAL ABDOMINAL HYSTERECTOMY W/ BILATERAL SALPINGOOPHORECTOMY  1980   dysfunctional menses, endometriosis   VARICOSE VEIN SURGERY     1960, 1965, 2009   Family History  Problem Relation Age of Onset   Heart attack Father 34   Alzheimer's disease Mother 74       TIAs   Colon cancer Maternal Grandmother    Heart attack Paternal Grandmother 73   Hyperlipidemia Brother    Hypertension Brother    Diverticulitis Daughter        S/P colectomy   Diabetes Neg Hx    Stroke Neg Hx     Hypercalcemia Neg Hx    Stomach cancer Neg Hx    Pancreatic cancer Neg Hx    Esophageal cancer Neg Hx    Social History   Socioeconomic History   Marital status: Married    Spouse name: Not on file   Number of children: 2   Years of education: Not on file   Highest education level: Not on file  Occupational History   Occupation: Retired    Fish farm manager: RETIRED  Tobacco Use   Smoking status: Never   Smokeless tobacco: Never  Vaping Use   Vaping Use: Never used  Substance and Sexual Activity   Alcohol use: No   Drug use: No   Sexual activity: Not on file  Other Topics Concern   Not on file  Social History Narrative   REG EXERCISE   HAD COLONOSCOPY AND ENDOSCOPY DONE DATES UNKNOWN   Social Determinants of Health   Financial Resource Strain: Low Risk    Difficulty of Paying Living Expenses: Not hard at all  Food Insecurity: No Food Insecurity   Worried About Charity fundraiser in the Last Year: Never true   [place] in the Last Year: Never true  Transportation Needs: No Transportation Needs   Lack of Transportation (Medical): No   Lack of Transportation (Non-Medical): No  Physical Activity: Inactive   Days of Exercise per Week: 0 days   Minutes of Exercise per Session: 0 min  Stress: No Stress Concern Present   Feeling of Stress : Not at all  Social Connections: Moderately Isolated   Frequency of Communication with Friends and Family: More than three times a week   Frequency of Social Gatherings with Friends and Family: More than three times a week   Attends Religious Services: Never   Marine scientist or Organizations: No   Attends Music therapist: Never   Marital Status: Married    Tobacco Counseling Counseling given: Not Answered   Clinical Intake:  Pre-visit preparation completed: Yes  Pain : No/denies pain     BMI - recorded: 25.23 Nutritional Status: BMI 25 -29 Overweight Nutritional Risks: None Diabetes: No  How  often do you need to have someone help you when you read instructions, pamphlets, or other written materials from your doctor or pharmacy?: 1 - Never What is the last grade level you completed in school?: High School Graduate  Diabetic? no  Interpreter Needed?: No  Information entered  by :: Lisette Abu, LPN   Activities of Daily Living In your present state of health, do you have any difficulty performing the following activities: 08/04/2021 08/03/2021  Hearing? N N  Vision? N N  Difficulty concentrating or making decisions? N N  Walking or climbing stairs? N N  Dressing or bathing? N N  Doing errands, shopping? N N  Preparing Food and eating ? N -  Using the Toilet? N -  In the past six months, have you accidently leaked urine? N -  Do you have problems with loss of bowel control? N -  Managing your Medications? N -  Managing your Finances? N -  Housekeeping or managing your Housekeeping? N -  Some recent data might be hidden    Patient Care Team: Binnie Rail, MD as PCP - General (Internal Medicine) Stanford Breed Denice Bors, MD as PCP - Cardiology (Cardiology) Charlton Haws, [facility] as Pharmacist (Pharmacist) Luberta Mutter, MD as Consulting Physician (Ophthalmology)  Indicate any recent Medical Services you may have received from other than [facility] providers in the past year (date may be approximate).     Assessment:   This is a routine wellness examination for Florie.  Hearing/Vision screen Hearing Screening - Comments:: Patient denied any hearing difficulty. Vision Screening - Comments:: Patient has annual eye exam done by Shon Hough, MD (who has recently retired).  Patient will be seeing Dr. Luberta Mutter.  Dietary issues and exercise activities discussed: Current Exercise Habits: The patient does not participate in regular exercise at present, Exercise limited by: cardiac condition(s);orthopedic condition(s)   Goals Addressed               This  Visit's Progress     Patient Stated (pt-stated)        Patient Stated (pt-stated)        My goal is to increase my physically activity by walking on the treadmill for at least 5 minutes and work my way up.  Hopefully this will help my blood pressure.      Depression Screen PHQ 2/9 Scores 08/04/2021 01/21/2020 01/15/2019 01/09/2018 01/09/2018 11/29/2016 10/09/2015  PHQ - 2 Score 0 0 0 0 0 0 0  PHQ- 9 Score - - - 0 - - -    Fall Risk Fall Risk  08/04/2021 02/02/2021 01/21/2020 01/15/2019 01/09/2018  Falls in the past year? 0 0 0 0 No  Number falls in past yr: 0 0 0 - -  Injury with Fall? 0 0 0 - -  Risk for fall due to : No Fall Risks No Fall Risks - - -  Follow up Falls evaluation completed Falls evaluation completed - - -    FALL RISK PREVENTION PERTAINING TO THE HOME:  Any stairs in or around the home? No  If so, are there any without handrails? No  Home free of loose throw rugs in walkways, pet beds, electrical cords, etc? Yes  Adequate lighting in your home to reduce risk of falls? Yes   ASSISTIVE DEVICES UTILIZED TO PREVENT FALLS:  Life alert? No  Use of a cane, walker or w/c? Yes  Grab bars in the bathroom? Yes  Shower chair or bench in shower? Yes  Elevated toilet seat or a handicapped toilet? Yes   TIMED UP AND GO:  Was the test performed? No .  Length of time to ambulate 10 feet: n/a sec.   Cognitive Function: Normal cognitive status assessed by direct observation by this Nurse Health Advisor. No abnormalities  found.          Immunizations Immunization History  Administered Date(s) Administered   Fluad Quad(high Dose 65+) 07/16/2019, 09/06/2020, 08/03/2021   H1N1 12/09/2008   Influenza Split 09/23/2011, 08/31/2012   Influenza Whole 09/28/2007, 09/09/2008, 09/08/2009, 08/14/2010   Influenza, High Dose Seasonal PF 09/20/2013, 09/16/2015, 09/07/2016, 08/22/2017, 09/04/2018   Influenza,inj,Quad PF,6+ Mos 09/13/2014   Influenza,inj,quad, With Preservative 08/22/2017    PFIZER(Purple Top)SARS-COV-2 Vaccination 12/14/2019, 01/03/2020, 09/02/2020, 03/16/2021   Pneumococcal Conjugate-13 09/13/2014   Pneumococcal Polysaccharide-23 09/25/2015   Td 06/02/2005   Tdap 11/29/2016   Zoster Recombinat (Shingrix) 03/10/2018, 06/23/2018   Zoster, Live 09/18/2012    TDAP status: Up to date  Flu Vaccine status: Up to date  Pneumococcal vaccine status: Up to date  Covid-19 vaccine status: Completed vaccines  Qualifies for Shingles Vaccine? Yes   Zostavax completed Yes   Shingrix Completed?: Yes  Screening Tests Health Maintenance  Topic Date Due   COVID-19 Vaccine (5 - Booster for Pfizer series) 07/16/2021   DEXA SCAN  02/24/2022   TETANUS/TDAP  11/29/2026   INFLUENZA VACCINE  Completed   PNA vac Low Risk Adult  Completed   Zoster Vaccines- Shingrix  Completed   HPV VACCINES  Aged Out    Health Maintenance  Health Maintenance Due  Topic Date Due   COVID-19 Vaccine (5 - Booster for [place] series) 07/16/2021    Colorectal cancer screening: No longer required.   Mammogram status: Completed 10/13/2020. Repeat every year  Bone Density status: Completed 02/25/2020. Results reflect: Bone density results: OSTEOPENIA. Repeat every 2 years.  Lung Cancer Screening: (Low Dose CT Chest recommended if Age 15-80 years, 30 pack-year currently smoking OR have quit w/in 15years.) does not qualify.   Lung Cancer Screening Referral: no  Additional Screening:  Hepatitis C Screening: does not qualify; Completed no  Vision Screening: Recommended annual ophthalmology exams for early detection of glaucoma and other disorders of the eye. Is the patient up to date with their annual eye exam?  Yes  Who is the provider or what is the name of the office in which the patient attends annual eye exams? Shon Hough, MD. If pt is not established with a provider, would they like to be referred to a provider to establish care? No .   Dental Screening: Recommended annual  dental exams for proper oral hygiene  Community Resource Referral / Chronic Care Management: CRR required this visit?  No   CCM required this visit?  No      Plan:     I have personally reviewed and noted the following in the patient's chart:   Medical and social history Use of alcohol, tobacco or illicit drugs  Current medications and supplements including opioid prescriptions.  Functional ability and status Nutritional status Physical activity Advanced directives List of other physicians Hospitalizations, surgeries, and ER visits in previous 12 months Vitals Screenings to include cognitive, depression, and falls Referrals and appointments  In addition, I have reviewed and discussed with patient certain preventive protocols, quality metrics, and best practice recommendations. A written personalized care plan for preventive services as well as general preventive health recommendations were provided to patient.     Sheral Flow, LPN   [id number]   Nurse Notes:  Patient is cogitatively intact. There were no vitals filed for this visit. There is no height or weight on file to calculate BMI. Hearing Screening - Comments:: Patient denied any hearing difficulty. Vision Screening - Comments:: Patient has annual eye exam done  by Shon Hough, MD (who has recently retired).  Patient will be seeing Dr. Luberta Mutter.

## 2021-08-04 NOTE — Patient Instructions (Addendum)
Valerie Rollins , Thank you for taking time to come for your Medicare Wellness Visit. I appreciate your ongoing commitment to your health goals. Please review the following plan we discussed and let me know if I can assist you in the future.   Screening recommendations/referrals: Colonoscopy: Not a candidate for screening due to age 85: 10/13/2020; due every year Bone Density: 02/25/2020; due every 2 years Recommended yearly ophthalmology/optometry visit for glaucoma screening and checkup Recommended yearly dental visit for hygiene and checkup  Vaccinations: Influenza vaccine: 08/03/2021 Pneumococcal vaccine: 09/13/2014, 09/25/2015 Tdap vaccine: 11/29/2016; due every 10 years Shingles vaccine: 03/10/2018, 06/23/2018   Covid-19: 12/14/2019, 01/03/2020, 09/02/2020, 03/16/2021  Advanced directives: Please bring a copy of your health care power of attorney and living will to the office at your convenience.  Conditions/risks identified: Yes; Client understands the importance of follow-up with providers by attending scheduled visits and discussed goals to eat healthier, increase physical activity, exercise the brain, socialize more, get enough sleep and make time for laughter.  Next appointment: Please schedule your next Medicare Wellness Visit with your Nurse Health Advisor in 1 year by calling [phone].   Preventive Care 79 Years and Older, Female Preventive care refers to lifestyle choices and visits with your health care provider that can promote health and wellness. What does preventive care include? A yearly physical exam. This is also called an annual well check. Dental exams once or twice a year. Routine eye exams. Ask your health care provider how often you should have your eyes checked. Personal lifestyle choices, including: Daily care of your teeth and gums. Regular physical activity. Eating a healthy diet. Avoiding tobacco and drug use. Limiting alcohol use. Practicing safe  sex. Taking low-dose aspirin every day. Taking vitamin and mineral supplements as recommended by your health care provider. What happens during an annual well check? The services and screenings done by your health care provider during your annual well check will depend on your age, overall health, lifestyle risk factors, and family history of disease. Counseling  Your health care provider may ask you questions about your: Alcohol use. Tobacco use. Drug use. Emotional well-being. Home and relationship well-being. Sexual activity. Eating habits. History of falls. Memory and ability to understand (cognition). Work and work Statistician. Reproductive health. Screening  You may have the following tests or measurements: Height, weight, and BMI. Blood pressure. Lipid and cholesterol levels. These may be checked every 5 years, or more frequently if you are over 54 years old. Skin check. Lung cancer screening. You may have this screening every year starting at age 39 if you have a 30-pack-year history of smoking and currently smoke or have quit within the past 15 years. Fecal occult blood test (FOBT) of the stool. You may have this test every year starting at age 7. Flexible sigmoidoscopy or colonoscopy. You may have a sigmoidoscopy every 5 years or a colonoscopy every 10 years starting at age 47. Hepatitis C blood test. Hepatitis B blood test. Sexually transmitted disease (STD) testing. Diabetes screening. This is done by checking your blood sugar (glucose) after you have not eaten for a while (fasting). You may have this done every 1-3 years. Bone density scan. This is done to screen for osteoporosis. You may have this done starting at age 10. Mammogram. This may be done every 1-2 years. Talk to your health care provider about how often you should have regular mammograms. Talk with your health care provider about your test results, treatment options, and if necessary, the  need for more  tests. Vaccines  Your health care provider may recommend certain vaccines, such as: Influenza vaccine. This is recommended every year. Tetanus, diphtheria, and acellular pertussis (Tdap, Td) vaccine. You may need a Td booster every 10 years. Zoster vaccine. You may need this after age 21. Pneumococcal 13-valent conjugate (PCV13) vaccine. One dose is recommended after age 16. Pneumococcal polysaccharide (PPSV23) vaccine. One dose is recommended after age 26. Talk to your health care provider about which screenings and vaccines you need and how often you need them. This information is not intended to replace advice given to you by your health care provider. Make sure you discuss any questions you have with your health care provider. Document Released: 12/05/2015 Document Revised: 07/28/2016 Document Reviewed: 09/09/2015 Elsevier Interactive Patient Education  2017 [place] Prevention in the Home Falls can cause injuries. They can happen to people of all ages. There are many things you can do to make your home safe and to help prevent falls. What can I do on the outside of my home? Regularly fix the edges of walkways and driveways and fix any cracks. Remove anything that might make you trip as you walk through a door, such as a raised step or threshold. Trim any bushes or trees on the path to your home. Use bright outdoor lighting. Clear any walking paths of anything that might make someone trip, such as rocks or tools. Regularly check to see if handrails are loose or broken. Make sure that both sides of any steps have handrails. Any raised decks and porches should have guardrails on the edges. Have any leaves, snow, or ice cleared regularly. Use sand or salt on walking paths during winter. Clean up any spills in your garage right away. This includes oil or grease spills. What can I do in the bathroom? Use night lights. Install grab bars by the toilet and in the tub and shower.  Do not use towel bars as grab bars. Use non-skid mats or decals in the tub or shower. If you need to sit down in the shower, use a plastic, non-slip stool. Keep the floor dry. Clean up any water that spills on the floor as soon as it happens. Remove soap buildup in the tub or shower regularly. Attach bath mats securely with double-sided non-slip rug tape. Do not have throw rugs and other things on the floor that can make you trip. What can I do in the bedroom? Use night lights. Make sure that you have a light by your bed that is easy to reach. Do not use any sheets or blankets that are too big for your bed. They should not hang down onto the floor. Have a firm chair that has side arms. You can use this for support while you get dressed. Do not have throw rugs and other things on the floor that can make you trip. What can I do in the kitchen? Clean up any spills right away. Avoid walking on wet floors. Keep items that you use a lot in easy-to-reach places. If you need to reach something above you, use a strong step stool that has a grab bar. Keep electrical cords out of the way. Do not use floor polish or wax that makes floors slippery. If you must use wax, use non-skid floor wax. Do not have throw rugs and other things on the floor that can make you trip. What can I do with my stairs? Do not leave any items on the  stairs. Make sure that there are handrails on both sides of the stairs and use them. Fix handrails that are broken or loose. Make sure that handrails are as long as the stairways. Check any carpeting to make sure that it is firmly attached to the stairs. Fix any carpet that is loose or worn. Avoid having throw rugs at the top or bottom of the stairs. If you do have throw rugs, attach them to the floor with carpet tape. Make sure that you have a light switch at the top of the stairs and the bottom of the stairs. If you do not have them, ask someone to add them for you. What else  can I do to help prevent falls? Wear shoes that: Do not have high heels. Have rubber bottoms. Are comfortable and fit you well. Are closed at the toe. Do not wear sandals. If you use a stepladder: Make sure that it is fully opened. Do not climb a closed stepladder. Make sure that both sides of the stepladder are locked into place. Ask someone to hold it for you, if possible. Clearly mark and make sure that you can see: Any grab bars or handrails. First and last steps. Where the edge of each step is. Use tools that help you move around (mobility aids) if they are needed. These include: Canes. Walkers. Scooters. Crutches. Turn on the lights when you go into a dark area. Replace any light bulbs as soon as they burn out. Set up your furniture so you have a clear path. Avoid moving your furniture around. If any of your floors are uneven, fix them. If there are any pets around you, be aware of where they are. Review your medicines with your doctor. Some medicines can make you feel dizzy. This can increase your chance of falling. Ask your doctor what other things that you can do to help prevent falls. This information is not intended to replace advice given to you by your health care provider. Make sure you discuss any questions you have with your health care provider. Document Released: 09/04/2009 Document Revised: 04/15/2016 Document Reviewed: 12/13/2014 Elsevier Interactive Patient Education  2017 [employer].

## 2021-08-18 ENCOUNTER — Telehealth: Payer: Self-pay

## 2021-08-18 NOTE — Telephone Encounter (Signed)
Prolia VOB initiated via [email or web address]  Last OV:  Next OV:  Last Prolia inj: 02/23/21 Next Prolia inj DUE: 08/26/21

## 2021-08-26 ENCOUNTER — Encounter: Payer: Self-pay | Admitting: Internal Medicine

## 2021-08-27 NOTE — Telephone Encounter (Signed)
Prior Auth required for Prolia  PA PROCESS DETAILS: PA is required. Call [phone] or complete the PA form available at [email or web address]

## 2021-08-29 NOTE — Telephone Encounter (Signed)
Prior Auth initiated via [email or web address]  Key: [id number]

## 2021-08-31 ENCOUNTER — Ambulatory Visit: Payer: Medicare HMO

## 2021-09-04 NOTE — Telephone Encounter (Signed)
Pt ready for scheduling on or after 08/26/21  Out-of-pocket cost due at time of visit: $295  Primary: [employer] Prolia co-insurance: 20% (approximately $270) Admin fee co-insurance: 20% (approximately $25)  Secondary: n/a Prolia co-insurance:  Admin fee co-insurance:   Deductible: does not apply  Prior Auth: APPROVED PA# [id number] Valid: 08/29/21-08/29/22  ** This summary of benefits is an estimation of the patient's out-of-pocket cost. Exact cost may vary based on individual plan coverage.

## 2021-09-04 NOTE — Telephone Encounter (Signed)
Called [employer] Spoke to [employer] authorized Dates: 10.8.22 to 10.8.23 2 visits authorized  Auth [id number]  Done in provider office, buy and bill  Patient's daughter notified

## 2021-09-07 ENCOUNTER — Ambulatory Visit (INDEPENDENT_AMBULATORY_CARE_PROVIDER_SITE_OTHER): Payer: Medicare HMO

## 2021-09-07 ENCOUNTER — Other Ambulatory Visit: Payer: Self-pay

## 2021-09-07 DIAGNOSIS — M810 Age-related osteoporosis without current pathological fracture: Secondary | ICD-10-CM | POA: Diagnosis not present

## 2021-09-07 DIAGNOSIS — M159 Polyosteoarthritis, unspecified: Secondary | ICD-10-CM

## 2021-09-07 MED ORDER — DENOSUMAB 60 MG/ML ~~LOC~~ SOSY
60.0000 mg | PREFILLED_SYRINGE | Freq: Once | SUBCUTANEOUS | Status: AC
  Administered 2021-09-07: 60 mg via SUBCUTANEOUS

## 2021-09-07 NOTE — Progress Notes (Signed)
Prolia given  °Please co-sign ° °Javares Kaufhold, CMA °

## 2021-09-08 ENCOUNTER — Other Ambulatory Visit: Payer: Self-pay | Admitting: Internal Medicine

## 2021-10-01 NOTE — Telephone Encounter (Signed)
Last Prolia inj 09/07/21 Next Prolia inj due 03/09/22

## 2021-10-05 ENCOUNTER — Other Ambulatory Visit: Payer: Self-pay | Admitting: Internal Medicine

## 2021-10-20 DIAGNOSIS — Z1231 Encounter for screening mammogram for malignant neoplasm of breast: Secondary | ICD-10-CM | POA: Diagnosis not present

## 2021-10-20 LAB — HM MAMMOGRAPHY

## 2021-10-26 DIAGNOSIS — Z79891 Long term (current) use of opiate analgesic: Secondary | ICD-10-CM | POA: Diagnosis not present

## 2021-10-26 DIAGNOSIS — M4310 Spondylolisthesis, site unspecified: Secondary | ICD-10-CM | POA: Diagnosis not present

## 2021-10-26 DIAGNOSIS — M5136 Other intervertebral disc degeneration, lumbar region: Secondary | ICD-10-CM | POA: Diagnosis not present

## 2021-10-26 DIAGNOSIS — M5459 Other low back pain: Secondary | ICD-10-CM | POA: Diagnosis not present

## 2021-10-29 ENCOUNTER — Other Ambulatory Visit: Payer: Self-pay | Admitting: Internal Medicine

## 2021-10-30 ENCOUNTER — Other Ambulatory Visit: Payer: Self-pay

## 2021-10-30 ENCOUNTER — Telehealth: Payer: Self-pay | Admitting: Internal Medicine

## 2021-10-30 MED ORDER — DICYCLOMINE HCL 20 MG PO TABS: 20.0000 mg | ORAL_TABLET | Freq: Four times a day (QID) | ORAL | 1 refills | Status: DC | PRN

## 2021-10-30 NOTE — Telephone Encounter (Signed)
Patients daughter Eustaquio Maize called states the patient is still having continuing symptoms and is seeking additional advise.

## 2021-10-30 NOTE — Telephone Encounter (Signed)
Spoke with pts daughter and she reports she has scheduled an appt for her mother to see Dr. Henrene Pastor but she needs a refill on her dicyclomine. Refill sent to pharmacy.

## 2021-11-05 ENCOUNTER — Telehealth: Payer: Self-pay

## 2021-11-05 NOTE — Chronic Care Management (AMB) (Signed)
Chronic Care Management Pharmacy Assistant   Name: Valerie Rollins  MRN: [id number] DOB: June 09, 1936   Reason for Encounter: Disease State   Conditions to be addressed/monitored: HTN  Recent office visits:  08/03/21 Binnie Rail, MD-PCP (Annual Exam) Blood work was ordered.  Medications changes include :   none  Recent consult visits:  07/13/21 Lelon Perla, MD-Cardiology ( CAD) Labs, med change: losartan Pot 50 mg  Hospital visits:  None in previous 6 months  Medications: Outpatient Encounter Medications as of 11/05/2021  Medication Sig   albuterol (VENTOLIN HFA) 108 (90 Base) MCG/ACT inhaler INHALE 2 PUFFS BY MOUTH EVERY 6 HOURS AS NEEDED FOR WHEEZE OR SHORTNESS OF BREATH   Ascorbic Acid (VITAMIN C) 100 MG tablet Take 100 mg by mouth daily.   augmented betamethasone dipropionate (DIPROLENE-AF) 0.05 % cream Apply topically as directed.   benzoyl peroxide 5 % gel Apply topically 2 (two) times daily.   calcium citrate (CALCITRATE - DOSED IN MG ELEMENTAL CALCIUM) 950 (200 Ca) MG tablet Take 200 mg of elemental calcium by mouth 2 (two) times daily.   Cholecalciferol (VITAMIN D3) 1000 UNITS CAPS Take 1,000 Units by mouth daily.    clotrimazole-betamethasone (LOTRISONE) cream Apply 1 application topically daily.   Coenzyme Q10 (CO Q 10 PO) Take by mouth daily.   denosumab (PROLIA) 60 MG/ML SOSY injection Inject 60 mg into the skin every 6 (six) months.   dicyclomine (BENTYL) 20 MG tablet Take 1 tablet (20 mg total) by mouth every 6 (six) hours as needed. for pain   ELIQUIS 5 MG TABS tablet TAKE 1 TABLET BY MOUTH TWICE A DAY   fexofenadine (ALLEGRA) 180 MG tablet Take 1 tablet (180 mg total) by mouth daily.   fluticasone (FLONASE) 50 MCG/ACT nasal spray Place 2 sprays into both nostrils daily.   furosemide (LASIX) 40 MG tablet TAKE 1 TABLET (40 MG TOTAL) BY MOUTH DAILY. TAKE AN EXTRA 1/2 TABLET (20 MG) DAILY AS NEEDED   hydroxypropyl methylcellulose / hypromellose (ISOPTO TEARS /  GONIOVISC) 2.5 % ophthalmic solution 1 drop.   Lactobacillus (PROBIOTIC ACIDOPHILUS PO) Take 1 capsule by mouth daily.   latanoprost (XALATAN) 0.005 % ophthalmic solution Place 1 drop into both eyes daily.   losartan (COZAAR) 50 MG tablet Take 1 tablet (50 mg total) by mouth daily.   metoprolol tartrate (LOPRESSOR) 25 MG tablet TAKE 2 TABLETS EVERY MORNING AND 1 TABLET IN THE EVENING   montelukast (SINGULAIR) 10 MG tablet TAKE 1 TABLET BY MOUTH EVERYDAY AT BEDTIME   omeprazole (PRILOSEC) 20 MG capsule TAKE 1 CAPSULE BY MOUTH EVERY DAY   rosuvastatin (CRESTOR) 10 MG tablet TAKE 1 TABLET BY MOUTH EVERY DAY   traMADol (ULTRAM) 50 MG tablet Take 1 tablet (50 mg total) by mouth every 6 (six) hours as needed. Dr.Ramos (Patient taking differently: Take 50 mg by mouth every 6 (six) hours as needed. Dr.Ramos; pt takes 1 tablet every night, maybe 1 during the day as needed)   No facility-administered encounter medications on file as of 11/05/2021.    Recent Office Vitals: BP Readings from Last 3 Encounters:  08/03/21 130/60  07/13/21 132/78  04/21/21 (!) 144/69   Pulse Readings from Last 3 Encounters:  08/03/21 (!) 51  07/13/21 (!) 109  04/21/21 (!) 45    Wt Readings from Last 3 Encounters:  08/03/21 147 lb (66.7 kg)  07/13/21 148 lb (67.1 kg)  04/21/21 152 lb (68.9 kg)     Kidney Function Lab Results  Component Value Date/Time   CREATININE 0.83 08/03/2021 01:59 PM   CREATININE 0.87 07/21/2021 11:27 AM   CREATININE 0.93 (H) 08/01/2020 03:50 PM   CREATININE 0.94 (H) 01/27/2016 11:59 AM   GFR 65.39 08/03/2021 01:59 PM   GFRNONAA 58 (L) 08/01/2020 03:50 PM   GFRAA 67 08/01/2020 03:50 PM    BMP Latest Ref Rng & Units 08/03/2021 07/21/2021 02/02/2021  Glucose 70 - 99 mg/dL 90 89 85  BUN 6 - 23 mg/dL 24(H) 29(H) 23  Creatinine 0.40 - 1.20 mg/dL 0.83 0.87 0.87  BUN/Creat Ratio 12 - 28 - 33(H) -  Sodium 135 - 145 mEq/L 142 140 142  Potassium 3.5 - 5.1 mEq/L 3.4(L) 4.6 4.5  Chloride 96 -  112 mEq/L 102 102 104  CO2 19 - 32 mEq/L 29 26 30   Calcium 8.4 - 10.5 mg/dL 10.4 10.1 10.4   Reviewed chart prior to disease state call. Unable to speak with patient regarding BP   Current antihypertensive regimen:  Losartan 50 mg Metoprolol 25 mg  Any recent hospitalizations or ED visits since last visit with CPP? No  Adherence Review: Is the patient currently on ACE/ARB medication? Yes Does the patient have >5 day gap between last estimated fill dates? No  Care Gaps: Colonoscopy-NA Diabetic Foot Exam-NA Mammogram-NA Ophthalmology-NA Dexa Scan - 02/25/20 Annual Well Visit -  Micro albumin-NA Hemoglobin A1c- 08/03/21  Star Rating Drugs: Losartan Pot 50 mg-last fill 10/29/21 90 ds Rosuvastatin 10 mg-last fill 09/08/21  Ethelene Hal Clinical Pharmacist Assistant [phone]

## 2021-12-07 DIAGNOSIS — H401131 Primary open-angle glaucoma, bilateral, mild stage: Secondary | ICD-10-CM | POA: Diagnosis not present

## 2021-12-08 ENCOUNTER — Encounter: Payer: Self-pay | Admitting: Internal Medicine

## 2021-12-08 ENCOUNTER — Ambulatory Visit: Payer: Medicare HMO | Admitting: Internal Medicine

## 2021-12-08 VITALS — BP 116/60 | HR 82 | Ht 64.0 in | Wt 145.0 lb

## 2021-12-08 DIAGNOSIS — K589 Irritable bowel syndrome without diarrhea: Secondary | ICD-10-CM | POA: Diagnosis not present

## 2021-12-08 DIAGNOSIS — R109 Unspecified abdominal pain: Secondary | ICD-10-CM

## 2021-12-08 DIAGNOSIS — K219 Gastro-esophageal reflux disease without esophagitis: Secondary | ICD-10-CM

## 2021-12-08 MED ORDER — DICYCLOMINE HCL 20 MG PO TABS: 20.0000 mg | ORAL_TABLET | Freq: Four times a day (QID) | ORAL | 6 refills | Status: DC | PRN

## 2021-12-08 MED ORDER — OMEPRAZOLE 20 MG PO CPDR: DELAYED_RELEASE_CAPSULE | ORAL | 3 refills | Status: DC

## 2021-12-08 NOTE — Progress Notes (Signed)
HISTORY OF PRESENT ILLNESS:  Valerie Rollins is a 86 y.o. female with GERD and IBS who presents today for follow-up.  Last evaluated March 2022 regarding the same.  She continues on omeprazole for chronic reflux symptoms.  Been compliant with medication, no symptoms.  No dysphagia.  In terms of irritable bowel with abdominal cramping, she takes Bentyl on demand.  This works.  She does request refill of both prescriptions.  Review of blood work from September 2022 shows unremarkable comprehensive metabolic panel (potassium 3.4).  Normal liver test.  Normal CBC with hemoglobin 14.6.  Last colonoscopy 2012 was negative for neoplasia.  She presents today with her daughter.  She request medication refill.  She has no new complaints.  They do have questions regarding the patient's husband and his change in bowel habits.  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 s/p LIMA-LAD   Diverticulitis 2002   based on CT scan   Diverticulosis    Dr Henrene Pastor   DVT (deep venous thrombosis) ([place]) 2000   no trigger   Endometriosis    Gastritis 2006   @ Endo   GERD (gastroesophageal reflux disease)    Hiatal hernia    Hyperlipidemia    Hypertension    IBS (irritable bowel syndrome)    Ocular hypertension    glaucoma suspect, Dr. Kathrin Penner   Skin cancer    Superficial phlebitis     X 2, 1999, 2000   Syncope 11/12/11   in context CAP . Carotid Doppler exam was negative. 2D echocardiogram revealed mild dilation of the left atrium and moderate increase in the systolic pressureof  the  pulmonary artery   UTI (lower urinary tract infection) 01/2013   Strep bovis ; PCN sensitive    Past Surgical History:  Procedure Laterality Date   CARDIAC CATHETERIZATION N/A 03/28/2015   Procedure: Left Heart Cath and Coronary Angiography;  Surgeon: Jolaine Artist, MD;  Location: [facility];  Service: Cardiovascular;  Laterality: N/A;   CATARACT EXTRACTION Bilateral    CATARACT EXTRACTION W/ INTRAOCULAR LENS IMPLANT     OS; Dr Kathrin Penner   COLONOSCOPY  1995, 2002, 2012   diverticulosis; Dr Henrene Pastor   CORONARY ARTERY BYPASS GRAFT N/A 04/01/2015   Procedure: CORONARY ARTERY BYPASS GRAFTING (CABG), ON PUMP, TIMES ONE, USING LEFT INTERNAL MAMMARY ARTERY;  Surgeon: Ivin Poot, MD;  Location: [facility];  Service: Open Heart Surgery;  Laterality: N/A;   EXCISION OF ATRIAL MYXOMA N/A 04/01/2015   Procedure: EXCISION OF ATRIAL MYXOMA;  Surgeon: Ivin Poot, MD;  Location: [facility];  Service: Open Heart Surgery;  Laterality: N/A;   ORIF ANKLE FRACTURE  11/14/2011   Procedure: OPEN REDUCTION INTERNAL FIXATION (ORIF) ANKLE FRACTURE;  Surgeon: Colin Rhein;  Location: [facility];  Service: Orthopedics;  Laterality: Left;  open reduction internal fixation trimalleolar ankle fracture   TEE WITHOUT CARDIOVERSION N/A 04/01/2015   Procedure: TRANSESOPHAGEAL ECHOCARDIOGRAM (TEE);  Surgeon: Ivin Poot, MD;  Location: [facility];  Service: Open Heart Surgery;  Laterality: N/A;   TONSILLECTOMY     TOTAL ABDOMINAL HYSTERECTOMY W/ BILATERAL SALPINGOOPHORECTOMY  1980   dysfunctional menses, endometriosis   VARICOSE VEIN SURGERY     1960, 1965, 2009    Social History Valerie Rollins  reports that she has never smoked. She has never used smokeless tobacco. She reports that she does not drink alcohol and does not use drugs.  family history includes Alzheimer's disease (age of onset: 76) in her mother; Colon cancer in her maternal grandmother; Diverticulitis in her daughter; Heart attack (age of onset:  83) in her father; Heart attack (age of onset: 51) in her paternal grandmother; Hyperlipidemia in her brother; Hypertension in her brother and brother; Prostate cancer (age of onset: 45) in her brother.  Allergies  Allergen Reactions   Oxybutynin Chloride     Tongue swelling; Rx from Dr Hessie Diener   Propoxyphene N-Acetaminophen     REACTION: made pt blood to thin   Alendronate Sodium     ? reaction   Ramipril     REACTION: cough       PHYSICAL EXAMINATION: Vital signs: BP 116/60    Pulse 82    Ht 5\' 4"  (1.626 m)    Wt 145 lb (65.8 kg)    SpO2 96%    BMI 24.89 kg/m   Constitutional: Somewhat frail and chronically ill-appearing, no acute distress Psychiatric: alert and oriented x3, cooperative Eyes: extraocular movements intact, anicteric, conjunctiva pink Mouth: oral pharynx moist, no lesions Neck: supple no lymphadenopathy Cardiovascular: Irregular rate and rhythm, no murmur Lungs: clear to auscultation bilaterally Abdomen: soft, nontender, nondistended, no obvious ascites, no peritoneal signs, normal bowel sounds, no organomegaly Rectal: Omitted Extremities: no clubbing or cyanosis.  2+ lower extremity edema bilaterally Skin: no lesions on visible extremities Neuro: No focal deficits.  Cranial nerves intact  ASSESSMENT:  1.  Chronic GERD.  Managed with omeprazole.  No alarm features. 2.  IBS.  Managed with on-demand Bentyl   PLAN:  1.  Reflux precautions 2.  Refill omeprazole.  Medication risks reviewed 3.  Refill Bentyl.  Medication risks reviewed 4.  Routine GI follow-up 1 to 2 years

## 2021-12-08 NOTE — Patient Instructions (Signed)
If you are age 84 or older, your body mass index should be between 23-30. Your Body mass index is 24.89 kg/m. If this is out of the aforementioned range listed, please consider follow up with your Primary Care Provider.  If you are age 38 or younger, your body mass index should be between 19-25. Your Body mass index is 24.89 kg/m. If this is out of the aformentioned range listed, please consider follow up with your Primary Care Provider.   ________________________________________________________  The [place] [facility] providers would like to encourage you to use [facility] to communicate with providers for non-urgent requests or questions.  Due to long hold times on the telephone, sending your provider a message by [facility] may be a faster and more efficient way to get a response.  Please allow 48 business hours for a response.  Please remember that this is for non-urgent requests.  _______________________________________________________  We have sent the following medications to your pharmacy for you to pick up at your convenience:  Omeprazole and Dicyclomine  Please follow up in 2 years.

## 2021-12-25 ENCOUNTER — Telehealth: Payer: Self-pay | Admitting: Internal Medicine

## 2021-12-25 NOTE — Telephone Encounter (Signed)
Inbound call from patient Daughter Eustaquio Maize. Would like a call back to discuss the approval of dicyclomine

## 2021-12-28 NOTE — Telephone Encounter (Signed)
Inbound call from patient Daughter Eustaquio Maize. Would like a call back to discuss the approval of dicyclomine. Please advise.

## 2021-12-29 NOTE — Telephone Encounter (Signed)
Spoke to [place] and told her I would submit PA.  Submitted to Cover my Meds - response showed it was covered and PA not necessary.  Called pharmacy and they said it was still going through successfully as covered.  Called Beth back and told her it appeared to be covered but if anything changed with that to let me know.  She agreed.

## 2022-01-04 DIAGNOSIS — H401131 Primary open-angle glaucoma, bilateral, mild stage: Secondary | ICD-10-CM | POA: Diagnosis not present

## 2022-01-12 ENCOUNTER — Ambulatory Visit (INDEPENDENT_AMBULATORY_CARE_PROVIDER_SITE_OTHER): Payer: Medicare HMO

## 2022-01-12 DIAGNOSIS — M159 Polyosteoarthritis, unspecified: Secondary | ICD-10-CM

## 2022-01-12 DIAGNOSIS — M810 Age-related osteoporosis without current pathological fracture: Secondary | ICD-10-CM

## 2022-01-12 DIAGNOSIS — K219 Gastro-esophageal reflux disease without esophagitis: Secondary | ICD-10-CM

## 2022-01-12 DIAGNOSIS — I4891 Unspecified atrial fibrillation: Secondary | ICD-10-CM

## 2022-01-12 DIAGNOSIS — E782 Mixed hyperlipidemia: Secondary | ICD-10-CM

## 2022-01-12 DIAGNOSIS — I2583 Coronary atherosclerosis due to lipid rich plaque: Secondary | ICD-10-CM

## 2022-01-12 DIAGNOSIS — I10 Essential (primary) hypertension: Secondary | ICD-10-CM

## 2022-01-12 DIAGNOSIS — I2510 Atherosclerotic heart disease of native coronary artery without angina pectoris: Secondary | ICD-10-CM

## 2022-01-12 NOTE — Progress Notes (Signed)
Chronic Care Management Pharmacy Note  01/12/2022 Name:  Valerie Rollins MRN:  [id number] DOB:  [birth year]  Summary: -Patient reports compliance to current medications  -Notes that she has a BP cuff, has ben monitoring on occasion, could not recall recent BP values  -Denies any issues or concerns with current medications  Recommendations/Changes made from today's visit: -Recommending no changes to medications, patient agreeable to start checking BP QOD and to record in a log, patient to reach out should BP average >140/90  Plan: -F/u in 6 months   Subjective: Valerie Rollins is an 86 y.o. year old female who is a primary patient of Burns, Claudina Lick, MD.  The CCM team was consulted for assistance with disease management and care coordination needs.    Engaged with patient by telephone for follow up visit in response to provider referral for pharmacy case management and/or care coordination services.   Consent to Services:  The patient was given the following information about Chronic Care Management services today, agreed to services, and gave verbal consent: 1. CCM service includes personalized support from designated clinical staff supervised by the primary care provider, including individualized plan of care and coordination with other care providers 2. 24/7 contact phone numbers for assistance for urgent and routine care needs. 3. Service will only be billed when office clinical staff spend 20 minutes or more in a month to coordinate care. 4. Only one practitioner may furnish and bill the service in a calendar month. 5.The patient may stop CCM services at any time (effective at the end of the month) by phone call to the office staff. 6. The patient will be responsible for cost sharing (co-pay) of up to 20% of the service fee (after annual deductible is met). Patient agreed to services and consent obtained.  Patient Care Team: Binnie Rail, MD as PCP - General (Internal Medicine) Stanford Breed  Denice Bors, MD as PCP - Cardiology (Cardiology) Luberta Mutter, MD as Consulting Physician (Ophthalmology) Tomasa Blase, [facility] (Pharmacist)  Recent office visits: 08/03/2021 - Dr. Quay Burow - flu shot given, no changes, f/u in 6 months   Recent consult visits: 12/08/2021 - Dr. Henrene Pastor Gertie Fey - f/u for GERD and IBS - no changes to medications - f/u in 2 years  07/13/2021 - Dr. Stanford Breed - Cardiology - increase losartan to 72m daily - f/u in 12 months   Hospital visits: None in previous 6 months  Objective:  Lab Results  Component Value Date   CREATININE 0.83 08/03/2021   BUN 24 (H) 08/03/2021   GFR 65.39 08/03/2021   GFRNONAA 58 (L) 08/01/2020   GFRAA 67 08/01/2020   NA 142 08/03/2021   K 3.4 (L) 08/03/2021   CALCIUM 10.4 08/03/2021   CO2 29 08/03/2021   GLUCOSE 90 08/03/2021    Lab Results  Component Value Date/Time   HGBA1C 5.8 08/03/2021 01:59 PM   HGBA1C 5.9 02/02/2021 12:27 PM   GFR 65.39 08/03/2021 01:59 PM   GFR 62.01 02/02/2021 12:27 PM    Last diabetic Eye exam:  No results found for: HMDIABEYEEXA  Last diabetic Foot exam:  No results found for: HMDIABFOOTEX   Lab Results  Component Value Date   CHOL 158 08/03/2021   HDL 88.90 08/03/2021   LDLCALC 59 08/03/2021   LDLDIRECT 152.6 07/31/2013   TRIG 54.0 08/03/2021   CHOLHDL 2 08/03/2021    Hepatic Function Latest Ref Rng & Units 08/03/2021 02/02/2021 08/01/2020  Total Protein 6.0 - 8.3 g/dL  7.3 7.0 6.9  Albumin 3.5 - 5.2 g/dL 4.5 4.3 -  AST 0 - 37 U/L _0 ALT 0 - 35 U/L 27 40(H) 28  Alk Phosphatase 39 - 117 U/L 49 58 -  Total Bilirubin 0.2 - 1.2 mg/dL 0.8 0.6 0.8  Bilirubin, Direct 0.00 - 0.40 mg/dL - - -    Lab Results  Component Value Date/Time   TSH 3.35 08/03/2021 01:59 PM   TSH 2.34 08/01/2020 03:50 PM    CBC Latest Ref Rng & Units 08/03/2021 02/02/2021 08/01/2020  WBC 4.0 - 10.5 K/uL 5.8 6.1 5.9  Hemoglobin 12.0 - 15.0 g/dL 14.6 14.4 14.7  Hematocrit 36.0 - 46.0 % 43.3 41.8 43.4   Platelets 150.0 - 400.0 K/uL 197.0 194.0 206    Lab Results  Component Value Date/Time   VD25OH 43.05 08/03/2021 01:59 PM   VD25OH 43.64 02/02/2021 12:27 PM    Clinical ASCVD: No  The ASCVD Risk score (Arnett DK, et al., 2019) failed to calculate for the following reasons:   The 2019 ASCVD risk score is only valid for ages 57 to 84    Depression screen PHQ 2/9 08/04/2021 01/21/2020 01/15/2019  Decreased Interest 0 0 0  Down, Depressed, Hopeless 0 0 0  PHQ - 2 Score 0 0 0  Altered sleeping - - -  Tired, decreased energy - - -  Change in appetite - - -  Feeling bad or failure about yourself  - - -  Trouble concentrating - - -  Moving slowly or fidgety/restless - - -  Suicidal thoughts - - -  PHQ-9 Score - - -  Difficult doing work/chores - - -  Some recent data might be hidden    Social History   Tobacco Use  Smoking Status Never  Smokeless Tobacco Never   BP Readings from Last 3 Encounters:  12/08/21 116/60  08/03/21 130/60  07/13/21 132/78   Pulse Readings from Last 3 Encounters:  12/08/21 82  08/03/21 (!) 51  07/13/21 (!) 109   Wt Readings from Last 3 Encounters:  12/08/21 145 lb (65.8 kg)  08/03/21 147 lb (66.7 kg)  07/13/21 148 lb (67.1 kg)   BMI Readings from Last 3 Encounters:  12/08/21 24.89 kg/m  08/03/21 25.23 kg/m  07/13/21 25.40 kg/m    Assessment/Interventions: Review of patient past medical history, allergies, medications, health status, including review of consultants reports, laboratory and other test data, was performed as part of comprehensive evaluation and provision of chronic care management services.   SDOH:  (Social Determinants of Health) assessments and interventions performed: Yes  SDOH Screenings   Alcohol Screen: Low Risk    Last Alcohol Screening Score (AUDIT): 0  Depression (PHQ2-9): Low Risk    PHQ-2 Score: 0  Financial Resource Strain: Low Risk    Difficulty of Paying Living Expenses: Not hard at all  Food Insecurity:  No Food Insecurity   Worried About Charity fundraiser in the Last Year: Never true   Ran Out of Food in the Last Year: Never true  Housing: Low Risk    Last Housing Risk Score: 0  Physical Activity: Inactive   Days of Exercise per Week: 0 days   Minutes of Exercise per Session: 0 min  Social Connections: Moderately Isolated   Frequency of Communication with Friends and Family: More than three times a week   Frequency of Social Gatherings with Friends and Family: More than three times a week   Attends Religious Services:  Never   Active Member of Clubs or Organizations: No   Attends Archivist Meetings: Never   Marital Status: Married  Stress: No Stress Concern Present   Feeling of Stress : Not at all  Tobacco Use: Low Risk    Smoking Tobacco Use: Never   Smokeless Tobacco Use: Never   Passive Exposure: Not on file  Transportation Needs: No Transportation Needs   Lack of Transportation (Medical): No   Lack of Transportation (Non-Medical): No    CCM Care Plan  Allergies  Allergen Reactions   Oxybutynin Chloride     Tongue swelling; Rx from Dr Hessie Diener   Propoxyphene N-Acetaminophen     REACTION: made pt blood to thin   Alendronate Sodium     ? reaction   Ramipril     REACTION: cough    Medications Reviewed Today     Reviewed by Tomasa Blase, [facility] (Pharmacist) on 01/12/22 at 0907  Med List Status: <None>   Medication Order Taking? Sig Documenting Provider Last Dose Status Informant  albuterol (VENTOLIN HFA) 108 (90 Base) MCG/ACT inhaler [id number] Yes INHALE 2 PUFFS BY MOUTH EVERY 6 HOURS AS NEEDED FOR WHEEZE OR SHORTNESS OF BREATH Burns, Claudina Lick, MD Taking Active   Ascorbic Acid (VITAMIN C) 100 MG tablet [id number] Yes Take 100 mg by mouth daily. [provider] Taking Active   augmented betamethasone dipropionate (DIPROLENE-AF) 0.05 % cream [id number] Yes Apply topically as directed. [provider] Taking Active   benzoyl peroxide 5 % gel  [id number] No Apply topically 2 (two) times daily.  Patient not taking: Reported on 01/12/2022   [provider] Not Taking Consider Medication Status and Discontinue   calcium citrate (CALCITRATE - DOSED IN MG ELEMENTAL CALCIUM) 950 (200 Ca) MG tablet [id number] Yes Take 200 mg of elemental calcium by mouth 2 (two) times daily. [provider] Taking Active   Cholecalciferol (VITAMIN D3) 1000 UNITS CAPS [id number] Yes Take 1,000 Units by mouth daily.  [provider] Taking Active Self  clotrimazole-betamethasone (LOTRISONE) cream [id number] Yes Apply 1 application topically daily. Binnie Rail, MD Taking Active   Coenzyme Q10 (CO Q 10 PO) [id number] Yes Take by mouth daily. [provider] Taking Active   denosumab (PROLIA) 60 MG/ML SOSY injection [id number] Yes Inject 60 mg into the skin every 6 (six) months. [provider] Taking Active   dicyclomine (BENTYL) 20 MG tablet [id number] Yes Take 1 tablet (20 mg total) by mouth every 6 (six) hours as needed. for pain Irene Shipper, MD Taking Active   ELIQUIS 5 MG TABS tablet [id number] Yes TAKE 1 TABLET BY MOUTH TWICE A DAY Crenshaw, Denice Bors, MD Taking Active   fexofenadine (ALLEGRA) 180 MG tablet [id number] Yes Take 1 tablet (180 mg total) by mouth daily. Binnie Rail, MD Taking Active   furosemide (LASIX) 40 MG tablet [id number] Yes TAKE 1 TABLET (40 MG TOTAL) BY MOUTH DAILY. TAKE AN EXTRA 1/2 TABLET (20 MG) DAILY AS NEEDED Burns, Claudina Lick, MD Taking Active   hydroxypropyl methylcellulose / hypromellose (ISOPTO TEARS / GONIOVISC) 2.5 % ophthalmic solution [id number]  1 drop. [provider]  Active   Lactobacillus (PROBIOTIC ACIDOPHILUS PO) [id number] Yes Take 1 capsule by mouth daily. [provider] Taking Active   latanoprost (XALATAN) 0.005 % ophthalmic solution [id number] No Place 1 drop into both eyes daily.  Patient not taking: Reported on 01/12/2022   [provider] Not Taking  Active Self  losartan (COZAAR) 50  MG tablet [id number] Yes Take 1 tablet (50 mg total) by mouth daily. Lelon Perla, MD Taking Active   metoprolol tartrate (LOPRESSOR) 25 MG tablet [id number] Yes TAKE 2 TABLETS EVERY MORNING AND 1 TABLET IN THE Anne Ng, Denice Bors, MD Taking Active   omeprazole (PRILOSEC) 20 MG capsule [id number] Yes TAKE 1 CAPSULE BY MOUTH EVERY DAY Irene Shipper, MD Taking Active   rosuvastatin (CRESTOR) 10 MG tablet [id number] Yes TAKE 1 TABLET BY MOUTH EVERY DAY Stanford Breed, Denice Bors, MD Taking Active   traMADol (ULTRAM) 50 MG tablet [id number] Yes Take 1 tablet (50 mg total) by mouth every 6 (six) hours as needed. Dr.Ramos  Patient taking differently: Take 50 mg by mouth every 6 (six) hours as needed. Dr.Ramos; pt takes 1 tablet every night, maybe 1 during the day as needed   Nani Skillern, [place] Taking Active             Patient Active Problem List   Diagnosis Date Noted   Thickened nails 02/02/2021   Chronic venous stasis dermatitis of both lower extremities 01/21/2020   Hypercalcemia 07/19/2019   CKD (chronic kidney disease) stage 3, GFR 30-59 ml/min 07/19/2019   Candidal intertrigo 04/20/2018   Angular stomatitis 04/20/2018   Seasonal allergic rhinitis due to pollen 03/21/2018   Bilateral leg edema 12/31/2016   Prediabetes 01/10/2016   Osteoporosis - prolia Q 6 months 12/26/2015   Mass of neck 07/07/2015   Long-term (current) use of anticoagulants 04/16/2015   S/P CABG x 1 04/01/2015   CAD (coronary artery disease) 03/28/2015   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 Crenshaw 11/15/2011   Syncope 11/12/2011   RBBB (right bundle branch block) 11/12/2011   SKIN CANCER, HX OF 11/18/2009   Diverticulosis of large intestine 10/31/2008   VARICOSE VEINS, LOWER EXTREMITIES 03/13/2008   GERD 02/20/2008   IBS 02/20/2008   Osteoarthritis 02/20/2008    Hyperlipidemia 12/01/2006   Essential hypertension 12/01/2006   SUPERFICIAL THROMBOPHLEBITIS 12/01/2006   DVT, HX OF 12/01/2006    Immunization History  Administered Date(s) Administered   Fluad Quad(high Dose 65+) 07/16/2019, 09/06/2020, 08/03/2021   H1N1 12/09/2008   Influenza Split 09/23/2011, 08/31/2012   Influenza Whole 09/28/2007, 09/09/2008, 09/08/2009, 08/14/2010   Influenza, High Dose Seasonal PF 09/20/2013, 09/16/2015, 09/07/2016, 08/22/2017, 09/04/2018   Influenza,inj,Quad PF,6+ Mos 09/13/2014   Influenza,inj,quad, With Preservative 08/22/2017   PFIZER(Purple Top)SARS-COV-2 Vaccination 12/14/2019, 01/03/2020, 09/02/2020, 03/16/2021   Pneumococcal Conjugate-13 09/13/2014   Pneumococcal Polysaccharide-23 09/25/2015   Td 06/02/2005   Tdap 11/29/2016   Zoster Recombinat (Shingrix) 03/10/2018, 06/23/2018   Zoster, Live 09/18/2012    Conditions to be addressed/monitored:  Hypertension, Hyperlipidemia, Atrial Fibrillation, Coronary Artery Disease, GERD, and Osteoporosis  Care Plan : CCM Pharmacy Care Plan  Updates made by Tomasa Blase, RPH since 01/12/2022 12:00 AM     Problem: Afib, CAD, HTN, HLD, Osteopenia, Osteoarthritis   Priority: High     Goal: Patient-Specific Goal   Start Date: 12/15/2020  Expected End Date: 01/12/2023  This Visit's Progress: On track  Recent Progress: On track  Priority: High  Note:   Current Barriers:  Unable to independently monitor therapeutic efficacy  Pharmacist Clinical Goal(s):  Patient will achieve adherence to monitoring guidelines and medication adherence to achieve therapeutic efficacy adhere to plan to optimize therapeutic regimen for allergies and GERD as evidenced by report of adherence to recommended medication management changes through collaboration with  PharmD and provider.   Interventions: 1:1 collaboration with Binnie Rail, MD regarding development and update of comprehensive plan of care as evidenced by provider  attestation and co-signature Inter-disciplinary care team collaboration (see longitudinal plan of care) Comprehensive medication review performed; medication list updated in electronic medical record  Hypertension (BP goal <130/80) -controlled -Current treatment: Losartan 50 mg daily AM Metoprolol tartrate 25 mg - 2 tab AM, 1 tab PM Furosemide 40 mg daily, and 1/2 tablet prn  -Current home readings: n/a BP Readings from Last 3 Encounters:  12/08/21 116/60  08/03/21 130/60  07/13/21 132/78  -Current dietary habits: tries to avoid salt -Current exercise habits: limited -Denies hypotensive/hypertensive symptoms -wearing compression socks most days -Educated on BP goals and benefits of medications for prevention of heart attack, stroke and kidney damage; Daily salt intake goal < 2300 mg; Importance of home blood pressure monitoring; Symptoms of hypotension and importance of maintaining adequate hydration; -Counseled to monitor BP at home weekly, document, and provide log at future appointments -Recommended to continue current medication  Hyperlipidemia / CAD: (LDL goal < 70) -controlled Lab Results  Component Value Date   LDLCALC 59 08/03/2021  -Current treatment: Rosuvastatin 10 mg daily PM Coenzyme Q10 -Educated on Cholesterol goals;  Benefits of statin for ASCVD risk reduction; benefits of CoQ10 are limited to treating side effects of statins - if pt does not have side effects, there is no need to take this -Recommended to continue current medication  Atrial Fibrillation (Goal: prevent stroke and major bleeding) -controlled  -CHADSVASC = 5 -Current treatment: Rate control: metoprolol tartrate 25 mg - 2 tab AM, 1 tab PM Anticoagulation: Eliquis 5 mg BID -Home BP and HR readings: n/a -occasional nosebleed  -Counseled on increased risk of stroke due to Afib and benefits of anticoagulation for stroke prevention; importance of adherence to anticoagulant exactly as  prescribed; bleeding risk associated with Eliquis and importance of self-monitoring for signs/symptoms of bleeding; avoidance of NSAIDs due to increased bleeding risk with anticoagulants; -Recommended to continue current medication   Osteopenia (Goal: prevent fractures) -controlled -Last DEXA Scan: 02/25/2020  T-Score femoral neck: -1.7  T-Score lumbar spine: +2.1  10-year probability of major osteoporotic fracture: 20%  10-year probability of hip fracture: 5% -Patient is a candidate for pharmacologic treatment due to T-Score -1.0 to -2.5 and 10-year risk of hip fracture > 3% -Current treatment  Prolia 60 mg q6 months  Vitamin D 1000 IU daily Calcium 400 mg - 1 tab bid -Counseled on history of hypercalcemia; pt has seen endocrine for this previously and has been advised to take less than the recommended amount of calcium due to this -Recommend [phone] units of vitamin D daily.   -Recommended to reduce calcium supplement to 1 tablet twice a day and follow up with PCP to recheck calcium levels  Osteoarthritis / DDD (Goal: mange symptoms) -controlled - pt does not use any OTC pain relievers -Current treatment  Tramadol 50 mg q6h PRN (Dr Nelva Bush) -Medications previously tried: n/a  -Counseled on using as little pain medication as possible -Recommended to continue current medication  GERD / IBS (Goal: manage symptoms) -controlled -Current treatment  Omeprazole 20 mg daily Dicyclomine 20 mg q6h PRN - typically 3 times a day Probiotic ([employer] brand) -Educated on benefits and potential side effects of dicyclomine; pt reports occasional dry moth but otherwise tolerates well -Recommended to continue current medication  Allergic rhinitis (Goal: manage symptoms) -controlled -Current treatment  Fexofenadine 180 mg daily  Albuterol HFA prn -  Counseled on polypharmacy; it is unclear if she is getting benefit from all medications  Health Maintenance -Vaccine gaps: Covid booster -Current  therapy:   Vitamin C 100 mg daily Benzoyl peroxide gel daily Betamethasone 0.05% cream Latanoprost 0.005% eye drops - on hold at this time per ophthalmology  -Patient is satisfied with current therapy and denies issues -Recommended to continue current medication  Patient Goals/Self-Care Activities Patient will:  - take medications as prescribed -Record blood pressure every other day   Follow Up Plan: Telephone follow up appointment with care management team member scheduled for: 6 months       Medication Assistance: None required.  Patient affirms current coverage meets needs.  Care Gaps: COVID booster   Patient's preferred pharmacy is:  [id number]- [place], [place] - [address] [id number]: [phone]: [phone]  Uses pill box? Yes Pt endorses 100% compliance  Care Plan and Follow Up Patient Decision:  Patient agrees to Care Plan and Follow-up.  Plan: Telephone follow up appointment with care management team member scheduled for:  6 months The patient has been provided with contact information for the care management team and has been advised to call with any health related questions or concerns.   DTomasa Blase PharmD Clinical Pharmacist, [place]

## 2022-01-12 NOTE — Patient Instructions (Signed)
Visit Information  Following are the goals we discussed today:   Manage My Medicine   Timeframe:  Long-Range Goal Priority:  Medium Start Date:    12/15/20                         Expected End Date:     01/15/2023                - call for medicine refill 2 or 3 days before it runs out - call if I am sick and can't take my medicine - keep a list of all the medicines I take; vitamins and herbals too - use a pillbox to sort medicine    Why is this important?   These steps will help you keep on track with your medicines.  Plan: Telephone follow up appointment with care management team member scheduled for:  6 months  The patient has been provided with contact information for the care management team and has been advised to call with any health related questions or concerns.   Tomasa Blase, PharmD Clinical Pharmacist, [facility]   Please call the care guide team at [phone] if you need to cancel or reschedule your appointment.   Patient verbalizes understanding of instructions and care plan provided today and agrees to view in [place]. Active MyChart status confirmed with patient.

## 2022-01-19 DIAGNOSIS — I2510 Atherosclerotic heart disease of native coronary artery without angina pectoris: Secondary | ICD-10-CM

## 2022-01-19 DIAGNOSIS — E782 Mixed hyperlipidemia: Secondary | ICD-10-CM

## 2022-01-19 DIAGNOSIS — I10 Essential (primary) hypertension: Secondary | ICD-10-CM

## 2022-01-19 DIAGNOSIS — I4891 Unspecified atrial fibrillation: Secondary | ICD-10-CM

## 2022-01-25 ENCOUNTER — Other Ambulatory Visit: Payer: Self-pay | Admitting: Cardiology

## 2022-01-25 NOTE — Telephone Encounter (Signed)
Prescription refill request for Eliquis received. ?Indication:Afib ?Last office visit:8/22 ?Scr:0.8 ?Age: 86 ?Weight:65.8 kg ? ?Prescription refilled ? ?

## 2022-01-26 NOTE — Telephone Encounter (Signed)
Prior auth required for PROLIA  PA PROCESS DETAILS: Precertification is required. Call [phone] or complete the Precertification form available at [email or web address] 

## 2022-01-31 NOTE — Patient Instructions (Addendum)
? ? ? ?  Blood work was ordered.   ? ? ?Medications changes include :   none ? ? ?Your prescription(s) have been sent to your pharmacy.  ? ? ? ?Return in about 6 months (around 08/04/2022) for follow up. ? ?

## 2022-01-31 NOTE — Progress Notes (Signed)
? ? ? ? ?Subjective:  ? ? Patient ID: Valerie Rollins, female    DOB: Aug 30, 1936, 86 y.o.   MRN: [id number] ? ?This visit occurred during the SARS-CoV-2 public health emergency.  Safety protocols were in place, including screening questions prior to the visit, additional usage of staff PPE, and extensive cleaning of exam room while observing appropriate contact time as indicated for disinfecting solutions.   ? ? ?HPI ?Valerie Rollins is here for follow up of her chronic medical problems, including A-fib, HTN, HLD, bilateral leg edema, CKD, GERD, osteoporosis on Prolia.  She is here with her daughter. ? ?She is eating well.  Not too active.  Has chronic back pain - intermittent depending on activity - takes tramadol as needed ? ?Leg and feet swelling.  Elevates legs when sitting.  Compression socks some days.  Taking lasix 40 mg daily -- takes extra 20 mg daily prn - does not do often.  ? ? ?Denies falls.  ? ? ?Medications and allergies reviewed with patient and updated if appropriate. ? ?Current Outpatient Medications on File Prior to Visit  ?Medication Sig Dispense Refill  ? albuterol (VENTOLIN HFA) 108 (90 Base) MCG/ACT inhaler INHALE 2 PUFFS BY MOUTH EVERY 6 HOURS AS NEEDED FOR WHEEZE OR SHORTNESS OF BREATH 6.7 each 5  ? Ascorbic Acid (VITAMIN C) 100 MG tablet Take 100 mg by mouth daily.    ? augmented betamethasone dipropionate (DIPROLENE-AF) 0.05 % cream Apply topically as directed.    ? calcium citrate (CALCITRATE - DOSED IN MG ELEMENTAL CALCIUM) 950 (200 Ca) MG tablet Take 200 mg of elemental calcium by mouth 2 (two) times daily.    ? Cholecalciferol (VITAMIN D3) 1000 UNITS CAPS Take 1,000 Units by mouth daily.     ? clotrimazole-betamethasone (LOTRISONE) cream Apply 1 application topically daily. 30 g 0  ? Coenzyme Q10 (CO Q 10 PO) Take by mouth daily.    ? denosumab (PROLIA) 60 MG/ML SOSY injection Inject 60 mg into the skin every 6 (six) months.    ? dicyclomine (BENTYL) 20 MG tablet Take 1 tablet (20 mg total) by mouth  every 6 (six) hours as needed. for pain 360 tablet 6  ? ELIQUIS 5 MG TABS tablet TAKE 1 TABLET BY MOUTH TWICE A DAY 60 tablet 5  ? fexofenadine (ALLEGRA) 180 MG tablet Take 1 tablet (180 mg total) by mouth daily. 30 tablet 11  ? furosemide (LASIX) 40 MG tablet TAKE 1 TABLET (40 MG TOTAL) BY MOUTH DAILY. TAKE AN EXTRA 1/2 TABLET (20 MG) DAILY AS NEEDED 135 tablet 1  ? hydroxypropyl methylcellulose / hypromellose (ISOPTO TEARS / GONIOVISC) 2.5 % ophthalmic solution 1 drop.    ? Lactobacillus (PROBIOTIC ACIDOPHILUS PO) Take 1 capsule by mouth daily.    ? losartan (COZAAR) 50 MG tablet Take 1 tablet (50 mg total) by mouth daily. 90 tablet 3  ? metoprolol tartrate (LOPRESSOR) 25 MG tablet TAKE 2 TABLETS EVERY MORNING AND 1 TABLET IN THE EVENING 270 tablet 3  ? omeprazole (PRILOSEC) 20 MG capsule TAKE 1 CAPSULE BY MOUTH EVERY DAY 90 capsule 3  ? rosuvastatin (CRESTOR) 10 MG tablet TAKE 1 TABLET BY MOUTH EVERY DAY 90 tablet 3  ? traMADol (ULTRAM) 50 MG tablet Take 1 tablet (50 mg total) by mouth every 6 (six) hours as needed. Dr.Ramos (Patient taking differently: Take 50 mg by mouth every 6 (six) hours as needed. Dr.Ramos; pt takes 1 tablet every night, maybe 1 during the day as needed) 30 tablet  0  ? ?No current facility-administered medications on file prior to visit.  ? ? ? ?Review of Systems  ?Constitutional:  Negative for chills and fever.  ?Respiratory:  Negative for cough, shortness of breath and wheezing.   ?Cardiovascular:  Negative for chest pain, palpitations and leg swelling.  ?Neurological:  Negative for light-headedness and headaches.  ? ?   ?Objective:  ? ?Vitals:  ? 02/01/22 1259  ?BP: 120/72  ?Pulse: 88  ?Temp: 98.6 ?F (37 ?C)  ?SpO2: 97%  ? ?BP Readings from Last 3 Encounters:  ?02/01/22 120/72  ?12/08/21 116/60  ?08/03/21 130/60  ? ?Wt Readings from Last 3 Encounters:  ?02/01/22 144 lb (65.3 kg)  ?12/08/21 145 lb (65.8 kg)  ?08/03/21 147 lb (66.7 kg)  ? ?Body mass index is 24.72 kg/m?. ? ?  ?Physical  Exam ?Constitutional:   ?   General: She is not in acute distress. ?   Appearance: Normal appearance.  ?HENT:  ?   Head: Normocephalic and atraumatic.  ?Eyes:  ?   Conjunctiva/sclera: Conjunctivae normal.  ?Cardiovascular:  ?   Rate and Rhythm: Normal rate and regular rhythm.  ?   Heart sounds: Normal heart sounds. No murmur heard. ?Pulmonary:  ?   Effort: Pulmonary effort is normal. No respiratory distress.  ?   Breath sounds: Normal breath sounds. No wheezing.  ?Musculoskeletal:  ?   Cervical back: Neck supple.  ?   Right lower leg: No edema.  ?   Left lower leg: No edema.  ?Lymphadenopathy:  ?   Cervical: No cervical adenopathy.  ?Skin: ?   Findings: No rash.  ?Neurological:  ?   Mental Status: She is alert. Mental status is at baseline.  ?Psychiatric:     ?   Mood and Affect: Mood normal.     ?   Behavior: Behavior normal.  ? ?   ? ?Lab Results  ?Component Value Date  ? WBC 5.8 08/03/2021  ? HGB 14.6 08/03/2021  ? HCT 43.3 08/03/2021  ? PLT 197.0 08/03/2021  ? GLUCOSE 90 08/03/2021  ? CHOL 158 08/03/2021  ? TRIG 54.0 08/03/2021  ? HDL 88.90 08/03/2021  ? LDLDIRECT 152.6 07/31/2013  ? [place] 59 08/03/2021  ? ALT 27 08/03/2021  ? AST 30 08/03/2021  ? NA 142 08/03/2021  ? K 3.4 (L) 08/03/2021  ? CL 102 08/03/2021  ? CREATININE 0.83 08/03/2021  ? BUN 24 (H) 08/03/2021  ? CO2 29 08/03/2021  ? TSH 3.35 08/03/2021  ? INR 1.4 07/31/2015  ? HGBA1C 5.8 08/03/2021  ? ? ? ?Assessment & Plan:  ? ? ?See Problem List for Assessment and Plan of chronic medical problems.  ? ? ?

## 2022-02-01 ENCOUNTER — Ambulatory Visit (INDEPENDENT_AMBULATORY_CARE_PROVIDER_SITE_OTHER): Payer: Medicare HMO | Admitting: Internal Medicine

## 2022-02-01 ENCOUNTER — Other Ambulatory Visit: Payer: Self-pay

## 2022-02-01 ENCOUNTER — Encounter: Payer: Self-pay | Admitting: Internal Medicine

## 2022-02-01 VITALS — BP 120/72 | HR 88 | Temp 98.6°F | Ht 64.0 in | Wt 144.0 lb

## 2022-02-01 DIAGNOSIS — R600 Localized edema: Secondary | ICD-10-CM

## 2022-02-01 DIAGNOSIS — I4891 Unspecified atrial fibrillation: Secondary | ICD-10-CM | POA: Diagnosis not present

## 2022-02-01 DIAGNOSIS — I10 Essential (primary) hypertension: Secondary | ICD-10-CM

## 2022-02-01 DIAGNOSIS — N1831 Chronic kidney disease, stage 3a: Secondary | ICD-10-CM

## 2022-02-01 DIAGNOSIS — E782 Mixed hyperlipidemia: Secondary | ICD-10-CM | POA: Diagnosis not present

## 2022-02-01 DIAGNOSIS — M810 Age-related osteoporosis without current pathological fracture: Secondary | ICD-10-CM

## 2022-02-01 DIAGNOSIS — M5136 Other intervertebral disc degeneration, lumbar region: Secondary | ICD-10-CM

## 2022-02-01 DIAGNOSIS — R7303 Prediabetes: Secondary | ICD-10-CM

## 2022-02-01 DIAGNOSIS — K219 Gastro-esophageal reflux disease without esophagitis: Secondary | ICD-10-CM

## 2022-02-01 LAB — CBC WITH DIFFERENTIAL/PLATELET
Basophils Absolute: 0 10*3/uL (ref 0.0–0.1)
Basophils Relative: 0.6 % (ref 0.0–3.0)
Eosinophils Absolute: 0.1 10*3/uL (ref 0.0–0.7)
Eosinophils Relative: 2 % (ref 0.0–5.0)
HCT: 42.5 % (ref 36.0–46.0)
Hemoglobin: 14.6 g/dL (ref 12.0–15.0)
Lymphocytes Relative: 26 % (ref 12.0–46.0)
Lymphs Abs: 1.6 10*3/uL (ref 0.7–4.0)
MCHC: 34.4 g/dL (ref 30.0–36.0)
MCV: 88.3 fl (ref 78.0–100.0)
Monocytes Absolute: 0.6 10*3/uL (ref 0.1–1.0)
Monocytes Relative: 9.4 % (ref 3.0–12.0)
Neutro Abs: 3.8 10*3/uL (ref 1.4–7.7)
Neutrophils Relative %: 62 % (ref 43.0–77.0)
Platelets: 199 10*3/uL (ref 150.0–400.0)
RBC: 4.81 Mil/uL (ref 3.87–5.11)
RDW: 12.9 % (ref 11.5–15.5)
WBC: 6.1 10*3/uL (ref 4.0–10.5)

## 2022-02-01 LAB — VITAMIN D 25 HYDROXY (VIT D DEFICIENCY, FRACTURES): VITD: 36.69 ng/mL (ref 30.00–100.00)

## 2022-02-01 LAB — LIPID PANEL
Cholesterol: 163 mg/dL (ref 0–200)
HDL: 95.5 mg/dL (ref >39.00)
LDL Cholesterol: 59 mg/dL (ref 0–99)
NonHDL: 67.97
Total CHOL/HDL Ratio: 2
Triglycerides: 46 mg/dL (ref 0.0–149.0)
VLDL: 9.2 mg/dL (ref 0.0–40.0)

## 2022-02-01 LAB — COMPREHENSIVE METABOLIC PANEL
ALT: 26 U/L (ref 0–35)
AST: 32 U/L (ref 0–37)
Albumin: 4.7 g/dL (ref 3.5–5.2)
Alkaline Phosphatase: 50 U/L (ref 39–117)
BUN: 32 mg/dL — ABNORMAL HIGH (ref 6–23)
CO2: 31 mEq/L (ref 19–32)
Calcium: 10.3 mg/dL (ref 8.4–10.5)
Chloride: 100 mEq/L (ref 96–112)
Creatinine, Ser: 0.87 mg/dL (ref 0.40–1.20)
GFR: 61.58 mL/min (ref >60.00)
Glucose, Bld: 81 mg/dL (ref 70–99)
Potassium: 3.7 mEq/L (ref 3.5–5.1)
Sodium: 141 mEq/L (ref 135–145)
Total Bilirubin: 0.7 mg/dL (ref 0.2–1.2)
Total Protein: 7.1 g/dL (ref 6.0–8.3)

## 2022-02-01 LAB — HEMOGLOBIN A1C: Hgb A1c MFr Bld: 5.9 % (ref 4.6–6.5)

## 2022-02-01 NOTE — Assessment & Plan Note (Signed)
Chronic back pain ?Sees Dr Nelva Bush ?Takes tramadol 50 mg daily prn ?

## 2022-02-01 NOTE — Assessment & Plan Note (Signed)
Chronic GERD controlled Continue omeprazole 20 mg daily  

## 2022-02-01 NOTE — Assessment & Plan Note (Signed)
Chronic Check a1c Low sugar / carb diet Stressed regular exercise  

## 2022-02-01 NOTE — Assessment & Plan Note (Signed)
Chronic ?Management per Dr. Stanford Breed ?On metoprolol 50 mg in a.m. and 25 mg in evening, Eliquis 5 mg twice daily ?CBC, CMP ?

## 2022-02-01 NOTE — Assessment & Plan Note (Signed)
Chronic cmp 

## 2022-02-01 NOTE — Assessment & Plan Note (Signed)
Chronic ?Overall controlled ?Taking furosemide 40 mg daily and an extra 20 mg daily as needed for increased swelling-she does notice on occasion ?She does weigh herself daily ?She wears her compression socks most daily-stressed doing this daily ?She does elevate her legs when sitting and uses a wedge pillow which really helps-encouraged to continue this consistently ?Encouraged increased activity-walking around inside the house during the day to help with the swelling ?CMP ?

## 2022-02-01 NOTE — Assessment & Plan Note (Addendum)
Chronic Regular exercise and healthy diet encouraged Check lipid panel  Continue Crestor 10 mg daily 

## 2022-02-01 NOTE — Assessment & Plan Note (Signed)
Chronic ?Blood pressure well controlled ?CMP ?Continue losartan 50 mg daily, metoprolol 50 mg in the morning and 25 mg in the evening ?

## 2022-02-01 NOTE — Assessment & Plan Note (Addendum)
Chronic ?Continue Prolia every 6 months-due next month ?DEXA due-ordered ?Not exercising regularly-encouraged increased activity ?Continue calcium and vitamin D daily ?Check vitamin D level ? ?

## 2022-02-02 ENCOUNTER — Encounter: Payer: Self-pay | Admitting: Internal Medicine

## 2022-02-08 ENCOUNTER — Encounter: Payer: Self-pay | Admitting: Internal Medicine

## 2022-02-09 ENCOUNTER — Telehealth: Payer: Self-pay | Admitting: Internal Medicine

## 2022-02-09 NOTE — Telephone Encounter (Signed)
Pts daughter Butch Penny requesting pt to receive prolia injection ? ?Caller requesting a cb when it is time to schedule injection  ? ?I could not locate caller on dpr, no pt information was released  ?

## 2022-02-09 NOTE — Telephone Encounter (Signed)
Marcina Millard, CMA  You 4 hours ago (11:20 AM)  ? ?Hi Reshawn Ostlund,  ? ?She is due for Prolia next month, 03/08/22. Can you check her benefits to make sure she is good to go for 2023? I can get her scheduled as long as she can still receive it.   ? ?

## 2022-02-11 ENCOUNTER — Other Ambulatory Visit: Payer: Self-pay

## 2022-02-11 MED ORDER — CLOTRIMAZOLE-BETAMETHASONE 1-0.05 % EX CREA: 1.0000 "application " | TOPICAL_CREAM | Freq: Every day | CUTANEOUS | 0 refills | Status: DC

## 2022-02-11 NOTE — Telephone Encounter (Signed)
Pt ready for scheduling on or after 03/09/22 ? ?Out-of-pocket cost due at time of visit: $301 ? ?Primary: Medicare ?Prolia co-insurance: 20% (approximately $276) ?Admin fee co-insurance: 20% (approximately $25) ? ?Secondary: n/a ?Prolia co-insurance:  ?Admin fee co-insurance:  ? ?Deductible: does not apply ? ?Prior Auth: APPROVED ?PA# [id number] ?Valid: 02/11/22-02/11/23 ? ?** This summary of benefits is an estimation of the patient's out-of-pocket cost. Exact cost may vary based on individual plan coverage.  ? ?

## 2022-02-11 NOTE — Telephone Encounter (Signed)
Prior auth approved via [employer] ?PA# [id number] ? ? ?

## 2022-02-12 NOTE — Telephone Encounter (Signed)
Message left for patient and my-chart sent as well regarding appointment. ? ?If she calls back please set up Prolia on nurse visit. ?

## 2022-02-21 ENCOUNTER — Other Ambulatory Visit: Payer: Self-pay | Admitting: Internal Medicine

## 2022-03-01 ENCOUNTER — Ambulatory Visit (INDEPENDENT_AMBULATORY_CARE_PROVIDER_SITE_OTHER)
Admission: RE | Admit: 2022-03-01 | Discharge: 2022-03-01 | Disposition: A | Discharge status: Home / Self Care | Payer: Medicare HMO | Source: Ambulatory Visit

## 2022-03-01 DIAGNOSIS — R600 Localized edema: Secondary | ICD-10-CM

## 2022-03-01 DIAGNOSIS — N1831 Chronic kidney disease, stage 3a: Secondary | ICD-10-CM

## 2022-03-01 DIAGNOSIS — R7303 Prediabetes: Secondary | ICD-10-CM

## 2022-03-01 DIAGNOSIS — E782 Mixed hyperlipidemia: Secondary | ICD-10-CM

## 2022-03-01 DIAGNOSIS — I4891 Unspecified atrial fibrillation: Secondary | ICD-10-CM

## 2022-03-01 DIAGNOSIS — M810 Age-related osteoporosis without current pathological fracture: Secondary | ICD-10-CM

## 2022-03-01 DIAGNOSIS — I10 Essential (primary) hypertension: Secondary | ICD-10-CM

## 2022-03-15 ENCOUNTER — Ambulatory Visit (INDEPENDENT_AMBULATORY_CARE_PROVIDER_SITE_OTHER): Payer: Medicare HMO | Admitting: *Deleted

## 2022-03-15 ENCOUNTER — Ambulatory Visit: Payer: Medicare HMO

## 2022-03-15 DIAGNOSIS — M810 Age-related osteoporosis without current pathological fracture: Secondary | ICD-10-CM

## 2022-03-15 DIAGNOSIS — M159 Polyosteoarthritis, unspecified: Secondary | ICD-10-CM

## 2022-03-15 MED ORDER — DENOSUMAB 60 MG/ML ~~LOC~~ SOSY
60.0000 mg | PREFILLED_SYRINGE | Freq: Once | SUBCUTANEOUS | Status: AC
  Administered 2022-03-15: 60 mg via SUBCUTANEOUS

## 2022-03-15 NOTE — Progress Notes (Signed)
Patient here in office to get a prolia injection Given in left arm subq. Patient tolerated well  ? ?Please co sign  ?

## 2022-03-17 NOTE — Telephone Encounter (Signed)
Last Prolia inj 03/15/22 ?Next Prolia inj 09/15/22 ?

## 2022-04-26 DIAGNOSIS — M5459 Other low back pain: Secondary | ICD-10-CM | POA: Diagnosis not present

## 2022-04-26 DIAGNOSIS — Z79891 Long term (current) use of opiate analgesic: Secondary | ICD-10-CM | POA: Diagnosis not present

## 2022-04-26 DIAGNOSIS — M5136 Other intervertebral disc degeneration, lumbar region: Secondary | ICD-10-CM | POA: Diagnosis not present

## 2022-05-14 ENCOUNTER — Encounter: Payer: Self-pay | Admitting: Internal Medicine

## 2022-05-19 ENCOUNTER — Other Ambulatory Visit: Payer: Self-pay | Admitting: Cardiology

## 2022-05-19 DIAGNOSIS — E7800 Pure hypercholesterolemia, unspecified: Secondary | ICD-10-CM

## 2022-07-12 ENCOUNTER — Telehealth: Payer: Medicare HMO

## 2022-07-23 ENCOUNTER — Encounter: Payer: Self-pay | Admitting: Internal Medicine

## 2022-07-30 ENCOUNTER — Telehealth: Payer: Self-pay | Admitting: Internal Medicine

## 2022-07-30 NOTE — Progress Notes (Signed)
HPI: FU CAD and atrial myxoma resection. Echo 4/16 orderded by primary care for cardiomegaly; this revealed normal LV function, left atrial mass; mild MR. Cardiac cath 5/16 showed 80 LAD. Preop carotid dopplers 5/16 with no significant stenosis. Patient had CABG with LIMA to LAD, resection of atrial myxoma and LAA oversewn. Patient had recurrent atrial fibrillation in September 2016. She has been treated with rate controlling medications and anticoagulation. Holter 4/18 showed atrial fibrillation, rate mildly decreased late PM and early AM but otherwise controlled. Echocardiogram March 2022 showed normal LV function, mild left ventricular hypertrophy, grade 2 diastolic dysfunction, moderate right ventricular enlargement, moderate pulmonary hypertension, severe tricuspid regurgitation, severe left atrial enlargement, moderate right atrial enlargement, mild mitral regurgitation, mildly dilated aortic root.  No evidence of recurrent atrial myxoma.  Since last seen, patient denies dyspnea with routine activities but does note some dyspnea with more vigorous activities.  No orthopnea or PND.  Chronic pedal edema.  No chest pain, palpitations, syncope or bleeding.  Current Outpatient Medications  Medication Sig Dispense Refill   albuterol (VENTOLIN HFA) 108 (90 Base) MCG/ACT inhaler INHALE 2 PUFFS BY MOUTH EVERY 6 HOURS AS NEEDED FOR WHEEZE OR SHORTNESS OF BREATH 6.7 each 5   Ascorbic Acid (VITAMIN C) 100 MG tablet Take 100 mg by mouth daily.     augmented betamethasone dipropionate (DIPROLENE-AF) 0.05 % cream Apply topically as directed.     calcium citrate (CALCITRATE - DOSED IN MG ELEMENTAL CALCIUM) 950 (200 Ca) MG tablet Take 200 mg of elemental calcium by mouth 2 (two) times daily.     Cholecalciferol (VITAMIN D3) 1000 UNITS CAPS Take 1,000 Units by mouth daily.      clotrimazole-betamethasone (LOTRISONE) cream Apply 1 application. topically daily. 30 g 0   Coenzyme Q10 (CO Q 10 PO) Take by mouth  daily.     denosumab (PROLIA) 60 MG/ML SOSY injection Inject 60 mg into the skin every 6 (six) months.     dicyclomine (BENTYL) 20 MG tablet Take 1 tablet (20 mg total) by mouth every 6 (six) hours as needed. for pain 360 tablet 6   ELIQUIS 5 MG TABS tablet TAKE 1 TABLET BY MOUTH TWICE A DAY 60 tablet 5   fexofenadine (ALLEGRA) 180 MG tablet Take 1 tablet (180 mg total) by mouth daily. 30 tablet 11   furosemide (LASIX) 40 MG tablet TAKE 1 TABLET BY MOUTH DAILY. TAKE AN EXTRA 1/2 TABLET DAILY AS NEEDED 135 tablet 1   hydroxypropyl methylcellulose / hypromellose (ISOPTO TEARS / GONIOVISC) 2.5 % ophthalmic solution 1 drop.     Lactobacillus (PROBIOTIC ACIDOPHILUS PO) Take 1 capsule by mouth daily.     losartan (COZAAR) 50 MG tablet Take 1 tablet (50 mg total) by mouth daily. 90 tablet 3   metoprolol tartrate (LOPRESSOR) 25 MG tablet TAKE 2 TABLETS EVERY MORNING AND 1 TABLET IN THE EVENING 270 tablet 3   omeprazole (PRILOSEC) 20 MG capsule TAKE 1 CAPSULE BY MOUTH EVERY DAY 90 capsule 3   rosuvastatin (CRESTOR) 10 MG tablet TAKE 1 TABLET BY MOUTH EVERY DAY 90 tablet 3   traMADol (ULTRAM) 50 MG tablet Take 1 tablet (50 mg total) by mouth every 6 (six) hours as needed. Dr.Ramos (Patient taking differently: Take 50 mg by mouth every 6 (six) hours as needed. Dr.Ramos; pt takes 1 tablet every night, maybe 1 during the day as needed) 30 tablet 0   No current facility-administered medications for this visit.     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 s/p LIMA-LAD   Diverticulitis 2002   based on CT scan   Diverticulosis    Dr Henrene Pastor   DVT (deep venous thrombosis) ([place]) 2000   no trigger   Endometriosis    Gastritis 2006   @ End[facility]   GERD  (gastroesophageal reflux disease)    Hiatal hernia    Hyperlipidemia    Hypertension    IBS (irritable bowel syndrome)    Ocular hypertension    glaucoma suspect, Dr. Kathrin Penner   Skin cancer    Superficial phlebitis     X 2, 1999, 2000   Syncope 11/12/11   in context CAP . Carotid Doppler exam was negative. 2D echocardiogram revealed mild dilation of the left atrium and moderate increase in the systolic pressureof  the pulmonary artery   UTI (lower urinary tract infection) 01/2013   Strep bovis ; PCN sensitive    Past Surgical History:  Procedure Laterality Date   CARDIAC CATHETERIZATION N/A 03/28/2015   Procedure: Left Heart Cath and Coronary Angiography;  Surgeon: Jolaine Artist, MD;  Location: [facility];  Service: Cardiovascular;  Laterality: N/A;   CATARACT EXTRACTION Bilateral    CATARACT EXTRACTION W/ INTRAOCULAR LENS IMPLANT     OS; Dr Kathrin Penner   COLONOSCOPY  1995, 2002, 2012   diverticulosis; Dr Henrene Pastor   CORONARY ARTERY BYPASS GRAFT N/A 04/01/2015   Procedure: CORONARY ARTERY BYPASS GRAFTING (CABG), ON PUMP, TIMES ONE, USING LEFT INTERNAL MAMMARY ARTERY;  Surgeon: Ivin Poot, MD;  Location: [facility];  Service: Open Heart Surgery;  Laterality: N/A;   EXCISION OF ATRIAL MYXOMA N/A 04/01/2015   Procedure: EXCISION OF ATRIAL MYXOMA;  Surgeon: Ivin Poot, MD;  Location: [facility];  Service: Open Heart Surgery;  Laterality: N/A;   ORIF ANKLE FRACTURE  11/14/2011   Procedure: OPEN REDUCTION INTERNAL FIXATION (ORIF) ANKLE FRACTURE;  Surgeon: Colin Rhein;  Location: [facility];  Service: Orthopedics;  Laterality: Left;  open reduction internal fixation trimalleolar ankle fracture   TEE WITHOUT CARDIOVERSION N/A 04/01/2015   Procedure: TRANSESOPHAGEAL ECHOCARDIOGRAM (TEE);  Surgeon: Ivin Poot, MD;  Location: [facility];  Service: Open Heart Surgery;  Laterality: N/A;   TONSILLECTOMY     TOTAL ABDOMINAL HYSTERECTOMY W/ BILATERAL SALPINGOOPHORECTOMY  1980    dysfunctional menses, endometriosis   VARICOSE VEIN SURGERY     1960, 1965, 2009    Social History   Socioeconomic History   Marital status: Married    Spouse name: Not on file   Number of children: 2   Years of education: Not on file   Highest education level: Not on file  Occupational History   Occupation: Retired    Fish farm manager: RETIRED  Tobacco Use   Smoking status: Never   Smokeless tobacco: Never  Vaping Use   Vaping Use: Never used  Substance and Sexual Activity   Alcohol use: No   Drug use: No   Sexual activity: Not on file  Other Topics Concern   Not on file  Social History Narrative   REG EXERCISE   HAD COLONOSCOPY AND ENDOSCOPY DONE DATES UNKNOWN   Social Determinants of Health   Financial Resource Strain: Low  Risk  (08/04/2021)   Overall Financial Resource Strain (CARDIA)    Difficulty of Paying Living Expenses: Not hard at all  Food Insecurity: No Food Insecurity (08/04/2021)   Hunger Vital Sign    Worried About Running Out of Food in the Last Year: Never true    Ran Out of Food in the Last Year: Never true  Transportation Needs: No Transportation Needs (08/04/2021)   PRAPARE - Hydrologist (Medical): No    Lack of Transportation (Non-Medical): No  Physical Activity: Inactive (08/04/2021)   Exercise Vital Sign    Days of Exercise per Week: 0 days    Minutes of Exercise per Session: 0 min  Stress: No Stress Concern Present (08/04/2021)   [place]    Feeling of Stress : Not at all  Social Connections: Moderately Isolated (08/04/2021)   Social Connection and Isolation Panel [NHANES]    Frequency of Communication with Friends and Family: More than three times a week    Frequency of Social Gatherings with Friends and Family: More than three times a week    Attends Religious Services: Never    Marine scientist or Organizations: No    Attends Archivist  Meetings: Never    Marital Status: Married  Human resources officer Violence: Not At Risk (08/04/2021)   Humiliation, Afraid, Rape, and Kick questionnaire    Fear of Current or Ex-Partner: No    Emotionally Abused: No    Physically Abused: No    Sexually Abused: No    Family History  Problem Relation Age of Onset   Alzheimer's disease Mother 61       TIAs   Heart attack Father 38   Hyperlipidemia Brother    Hypertension Brother    Hypertension Brother    Prostate cancer Brother 96   Colon cancer Maternal Grandmother    Heart attack Paternal Grandmother 82   Diverticulitis Daughter        S/P colectomy   Diabetes Neg Hx    Stroke Neg Hx    Hypercalcemia Neg Hx    Stomach cancer Neg Hx    Pancreatic cancer Neg Hx    Esophageal cancer Neg Hx     ROS: no fevers or chills, productive cough, hemoptysis, dysphasia, odynophagia, melena, hematochezia, dysuria, hematuria, rash, seizure activity, orthopnea, PND, claudication. Remaining systems are negative.  Physical Exam: Well-developed well-nourished in no acute distress.  Skin is warm and dry.  HEENT is normal.  Neck is supple.  Chest is clear to auscultation with normal expansion.  Cardiovascular exam is irregular Abdominal exam nontender or distended. No masses palpated. Extremities show trace edema. neuro grossly intact  ECG-atrial fibrillation at a rate of 86, right bundle branch block.  Personally reviewed  A/P  1 atrial fibrillation/flutter-plan to continue beta-blocker for rate control.  Note she has noticed that her heart rate is occasionally in the 40s and 50s.  I will decrease metoprolol to 25 mg twice daily.  Continue apixaban.  2 prior atrial myxoma resection-she has not had recurrence on most recent echocardiogram.  3 hypertension-patient's blood pressure is controlled.  Continue present medications and follow.  4 hyperlipidemia-continue statin.  5 coronary artery disease-continue statin.  No aspirin given need  for anticoagulation.  6 peripheral edema-controlled.  Continue diuretic at present dose.  7 tricuspid regurgitation-severe on most recent echocardiogram.  We will plan repeat study.  Would like to be conservative given patient's  age.  Kirk Ruths, MD

## 2022-07-30 NOTE — Telephone Encounter (Signed)
Left message for patient to call back to schedule Medicare Annual Wellness Visit   Last AWV  08/04/22  Please schedule at anytime with [facility] if patient calls the office back.    Any questions, please call me at [phone]

## 2022-07-31 NOTE — Telephone Encounter (Signed)
Prolia VOB initiated via [email or web address] 

## 2022-08-02 ENCOUNTER — Ambulatory Visit: Payer: Medicare HMO | Admitting: Cardiology

## 2022-08-02 DIAGNOSIS — Z961 Presence of intraocular lens: Secondary | ICD-10-CM | POA: Diagnosis not present

## 2022-08-02 DIAGNOSIS — H52203 Unspecified astigmatism, bilateral: Secondary | ICD-10-CM | POA: Diagnosis not present

## 2022-08-02 DIAGNOSIS — H40023 Open angle with borderline findings, high risk, bilateral: Secondary | ICD-10-CM | POA: Diagnosis not present

## 2022-08-09 ENCOUNTER — Ambulatory Visit: Payer: Medicare HMO | Admitting: Internal Medicine

## 2022-08-09 ENCOUNTER — Ambulatory Visit: Payer: Medicare HMO | Attending: Cardiology | Admitting: Cardiology

## 2022-08-09 ENCOUNTER — Encounter: Payer: Self-pay | Admitting: Cardiology

## 2022-08-09 VITALS — BP 130/80 | HR 86 | Ht 64.0 in | Wt 141.4 lb

## 2022-08-09 DIAGNOSIS — I2510 Atherosclerotic heart disease of native coronary artery without angina pectoris: Secondary | ICD-10-CM | POA: Diagnosis not present

## 2022-08-09 DIAGNOSIS — I10 Essential (primary) hypertension: Secondary | ICD-10-CM

## 2022-08-09 DIAGNOSIS — I079 Rheumatic tricuspid valve disease, unspecified: Secondary | ICD-10-CM | POA: Diagnosis not present

## 2022-08-09 DIAGNOSIS — I071 Rheumatic tricuspid insufficiency: Secondary | ICD-10-CM | POA: Diagnosis not present

## 2022-08-09 DIAGNOSIS — I4821 Permanent atrial fibrillation: Secondary | ICD-10-CM

## 2022-08-09 DIAGNOSIS — E7800 Pure hypercholesterolemia, unspecified: Secondary | ICD-10-CM | POA: Diagnosis not present

## 2022-08-09 MED ORDER — METOPROLOL TARTRATE 25 MG PO TABS: 25.0000 mg | ORAL_TABLET | Freq: Two times a day (BID) | ORAL | 3 refills | Status: DC

## 2022-08-09 NOTE — Patient Instructions (Signed)
Medication Instructions:   REDUCE METOPROLOL TO 25 MG TWICE DAILY  *If you need a refill on your cardiac medications before your next appointment, please call your pharmacy*   Testing/Procedures:  Your physician has requested that you have an echocardiogram. Echocardiography is a painless test that uses sound waves to create images of your heart. It provides your doctor with information about the size and shape of your heart and how well your heart's chambers and valves are working. This procedure takes approximately one hour. There are no restrictions for this procedure. [place], you and your health needs are our priority.  As part of our continuing mission to provide you with exceptional heart care, we have created designated Provider Care Teams.  These Care Teams include your primary Cardiologist (physician) and Advanced Practice Providers (APPs -  Physician Assistants and Nurse Practitioners) who all work together to provide you with the care you need, when you need it.  We recommend signing up for the patient portal called "MyChart".  Sign up information is provided on this After Visit Summary.  MyChart is used to connect with patients for Virtual Visits (Telemedicine).  Patients are able to view lab/test results, encounter notes, upcoming appointments, etc.  Non-urgent messages can be sent to your provider as well.   To learn more about what you can do with MyChart, go to [email or web address].    Your next appointment:   12 month(s)  The format for your next appointment:   In Person  Provider:   Kirk Ruths, MD

## 2022-08-11 ENCOUNTER — Other Ambulatory Visit: Payer: Self-pay | Admitting: Cardiology

## 2022-08-11 DIAGNOSIS — I10 Essential (primary) hypertension: Secondary | ICD-10-CM

## 2022-08-11 NOTE — Telephone Encounter (Signed)
Pt ready for scheduling on or after 09/15/22   Out-of-pocket cost due at time of visit: $301   Primary: [employer] Medicare Adv Prolia co-insurance: 20% (approximately $276) Admin fee co-insurance: 20% (approximately $25)   Secondary: n/a Prolia co-insurance:  Admin fee co-insurance:    Deductible: does not apply   Prior Auth: APPROVED PA# [id number] Valid: 02/11/22-02/11/23   ** This summary of benefits is an estimation of the patient's out-of-pocket cost. Exact cost may vary based on individual plan coverage.

## 2022-08-15 ENCOUNTER — Other Ambulatory Visit: Payer: Self-pay | Admitting: Internal Medicine

## 2022-08-15 NOTE — Patient Instructions (Addendum)
    Flu vaccine today   Blood work was ordered.      Medications changes include :   none      Return for 6-7 months for follow up .

## 2022-08-15 NOTE — Progress Notes (Signed)
Subjective:    Patient ID: Valerie Rollins, female    DOB: 11/04/1936, 86 y.o.   MRN: [id number]     HPI Valerie Rollins is here for follow up of her chronic medical problems, including Afib, htn, hld, b/l leg edema, prediabetes, ckd, GERD, OP on prolia.  Her w/ her daughter.   Has chronic back pain and is sedentary.  Takes tramadol as needed.  She follows with orthopedics.  Having more neck pain - has seen ortho - taking tramadol as needed.   Medications and allergies reviewed with patient and updated if appropriate.  Current Outpatient Medications on File Prior to Visit  Medication Sig Dispense Refill   albuterol (VENTOLIN HFA) 108 (90 Base) MCG/ACT inhaler INHALE 2 PUFFS BY MOUTH EVERY 6 HOURS AS NEEDED FOR WHEEZE OR SHORTNESS OF BREATH 6.7 each 5   Ascorbic Acid (VITAMIN C) 100 MG tablet Take 100 mg by mouth daily.     augmented betamethasone dipropionate (DIPROLENE-AF) 0.05 % cream Apply topically as directed.     calcium citrate (CALCITRATE - DOSED IN MG ELEMENTAL CALCIUM) 950 (200 Ca) MG tablet Take 200 mg of elemental calcium by mouth 2 (two) times daily.     Cholecalciferol (VITAMIN D3) 1000 UNITS CAPS Take 1,000 Units by mouth daily.      Coenzyme Q10 (CO Q 10 PO) Take by mouth daily.     denosumab (PROLIA) 60 MG/ML SOSY injection Inject 60 mg into the skin every 6 (six) months.     dicyclomine (BENTYL) 20 MG tablet Take 1 tablet (20 mg total) by mouth every 6 (six) hours as needed. for pain 360 tablet 6   ELIQUIS 5 MG TABS tablet TAKE 1 TABLET BY MOUTH TWICE A DAY 60 tablet 5   fexofenadine (ALLEGRA) 180 MG tablet Take 1 tablet (180 mg total) by mouth daily. 30 tablet 11   furosemide (LASIX) 40 MG tablet TAKE 1 TABLET BY MOUTH DAILY. TAKE AN EXTRA 1/2 TABLET DAILY AS NEEDED 135 tablet 1   hydroxypropyl methylcellulose / hypromellose (ISOPTO TEARS / GONIOVISC) 2.5 % ophthalmic solution 1 drop.     Lactobacillus (PROBIOTIC ACIDOPHILUS PO) Take 1 capsule by mouth daily.     losartan  (COZAAR) 50 MG tablet TAKE 1 TABLET BY MOUTH EVERY DAY 90 tablet 3   metoprolol tartrate (LOPRESSOR) 25 MG tablet Take 1 tablet (25 mg total) by mouth 2 (two) times daily. 270 tablet 3   omeprazole (PRILOSEC) 20 MG capsule TAKE 1 CAPSULE BY MOUTH EVERY DAY 90 capsule 3   rosuvastatin (CRESTOR) 10 MG tablet TAKE 1 TABLET BY MOUTH EVERY DAY 90 tablet 3   traMADol (ULTRAM) 50 MG tablet Take 1 tablet (50 mg total) by mouth every 6 (six) hours as needed. Dr.Ramos (Patient taking differently: Take 50 mg by mouth every 6 (six) hours as needed. Dr.Ramos; pt takes 1 tablet every night, maybe 1 during the day as needed) 30 tablet 0   No current facility-administered medications on file prior to visit.     Review of Systems  Constitutional:  Negative for fever.  HENT:  Positive for postnasal drip and voice change.   Respiratory:  Positive for shortness of breath (DOE). Negative for cough and wheezing.   Cardiovascular:  Positive for leg swelling. Negative for chest pain and palpitations.  Gastrointestinal:        Rare GERD  Musculoskeletal:  Positive for back pain and neck pain.  Neurological:  Negative for light-headedness and headaches.  Objective:   Vitals:   08/16/22 1314  BP: 134/72  Pulse: (!) 110  Temp: (!) 97.4 F (36.3 C)  SpO2: 98%   BP Readings from Last 3 Encounters:  08/16/22 134/72  08/09/22 130/80  02/01/22 120/72   Wt Readings from Last 3 Encounters:  08/16/22 139 lb (63 kg)  08/09/22 141 lb 6.4 oz (64.1 kg)  02/01/22 144 lb (65.3 kg)   Body mass index is 23.86 kg/m.    Physical Exam Constitutional:      General: She is not in acute distress.    Appearance: Normal appearance.  HENT:     Head: Normocephalic and atraumatic.  Eyes:     Conjunctiva/sclera: Conjunctivae normal.  Cardiovascular:     Rate and Rhythm: Regular rhythm. Tachycardia present.     Heart sounds: Normal heart sounds. No murmur heard. Pulmonary:     Effort: Pulmonary effort is  normal. No respiratory distress.     Breath sounds: Normal breath sounds. No wheezing.  Musculoskeletal:     Cervical back: Neck supple.     Right lower leg: Edema (mild) present.     Left lower leg: Edema (mild) present.  Lymphadenopathy:     Cervical: No cervical adenopathy.  Skin:    General: Skin is warm and dry.     Findings: No rash.  Neurological:     Mental Status: She is alert. Mental status is at baseline.  Psychiatric:        Mood and Affect: Mood normal.        Behavior: Behavior normal.        Lab Results  Component Value Date   WBC 6.1 02/01/2022   HGB 14.6 02/01/2022   HCT 42.5 02/01/2022   PLT 199.0 02/01/2022   GLUCOSE 81 02/01/2022   CHOL 163 02/01/2022   TRIG 46.0 02/01/2022   HDL 95.50 02/01/2022   LDLDIRECT 152.6 07/31/2013   LDLCALC 59 02/01/2022   ALT 26 02/01/2022   AST 32 02/01/2022   NA 141 02/01/2022   K 3.7 02/01/2022   CL 100 02/01/2022   CREATININE 0.87 02/01/2022   BUN 32 (H) 02/01/2022   CO2 31 02/01/2022   TSH 3.35 08/03/2021   INR 1.4 07/31/2015   HGBA1C 5.9 02/01/2022     Assessment & Plan:    Monitor weight - has lost 5 lbs since last visit  Flu vaccine today.  See Problem List for Assessment and Plan of chronic medical problems.

## 2022-08-16 ENCOUNTER — Encounter: Payer: Self-pay | Admitting: Internal Medicine

## 2022-08-16 ENCOUNTER — Ambulatory Visit (INDEPENDENT_AMBULATORY_CARE_PROVIDER_SITE_OTHER): Payer: Medicare HMO | Admitting: Internal Medicine

## 2022-08-16 VITALS — BP 134/72 | HR 110 | Temp 97.4°F | Ht 64.0 in | Wt 139.0 lb

## 2022-08-16 DIAGNOSIS — E782 Mixed hyperlipidemia: Secondary | ICD-10-CM

## 2022-08-16 DIAGNOSIS — R600 Localized edema: Secondary | ICD-10-CM

## 2022-08-16 DIAGNOSIS — M810 Age-related osteoporosis without current pathological fracture: Secondary | ICD-10-CM

## 2022-08-16 DIAGNOSIS — I10 Essential (primary) hypertension: Secondary | ICD-10-CM | POA: Diagnosis not present

## 2022-08-16 DIAGNOSIS — Z23 Encounter for immunization: Secondary | ICD-10-CM | POA: Diagnosis not present

## 2022-08-16 DIAGNOSIS — I4891 Unspecified atrial fibrillation: Secondary | ICD-10-CM | POA: Diagnosis not present

## 2022-08-16 DIAGNOSIS — K219 Gastro-esophageal reflux disease without esophagitis: Secondary | ICD-10-CM

## 2022-08-16 DIAGNOSIS — R7303 Prediabetes: Secondary | ICD-10-CM | POA: Diagnosis not present

## 2022-08-16 DIAGNOSIS — N1831 Chronic kidney disease, stage 3a: Secondary | ICD-10-CM | POA: Diagnosis not present

## 2022-08-16 LAB — COMPREHENSIVE METABOLIC PANEL
ALT: 25 U/L (ref 0–35)
AST: 29 U/L (ref 0–37)
Albumin: 4.7 g/dL (ref 3.5–5.2)
Alkaline Phosphatase: 50 U/L (ref 39–117)
BUN: 25 mg/dL — ABNORMAL HIGH (ref 6–23)
CO2: 32 mEq/L (ref 19–32)
Calcium: 10.7 mg/dL — ABNORMAL HIGH (ref 8.4–10.5)
Chloride: 101 mEq/L (ref 96–112)
Creatinine, Ser: 0.88 mg/dL (ref 0.40–1.20)
GFR: 60.51 mL/min (ref >60.00)
Glucose, Bld: 95 mg/dL (ref 70–99)
Potassium: 4 mEq/L (ref 3.5–5.1)
Sodium: 143 mEq/L (ref 135–145)
Total Bilirubin: 0.8 mg/dL (ref 0.2–1.2)
Total Protein: 7.5 g/dL (ref 6.0–8.3)

## 2022-08-16 LAB — LIPID PANEL
Cholesterol: 156 mg/dL (ref 0–200)
HDL: 93.5 mg/dL (ref >39.00)
LDL Cholesterol: 52 mg/dL (ref 0–99)
NonHDL: 62.23
Total CHOL/HDL Ratio: 2
Triglycerides: 49 mg/dL (ref 0.0–149.0)
VLDL: 9.8 mg/dL (ref 0.0–40.0)

## 2022-08-16 LAB — CBC WITH DIFFERENTIAL/PLATELET
Basophils Absolute: 0.1 10*3/uL (ref 0.0–0.1)
Basophils Relative: 1 % (ref 0.0–3.0)
Eosinophils Absolute: 0.1 10*3/uL (ref 0.0–0.7)
Eosinophils Relative: 1.5 % (ref 0.0–5.0)
HCT: 43.1 % (ref 36.0–46.0)
Hemoglobin: 14.7 g/dL (ref 12.0–15.0)
Lymphocytes Relative: 22.2 % (ref 12.0–46.0)
Lymphs Abs: 1.3 10*3/uL (ref 0.7–4.0)
MCHC: 34.1 g/dL (ref 30.0–36.0)
MCV: 89.1 fl (ref 78.0–100.0)
Monocytes Absolute: 0.5 10*3/uL (ref 0.1–1.0)
Monocytes Relative: 8.1 % (ref 3.0–12.0)
Neutro Abs: 3.8 10*3/uL (ref 1.4–7.7)
Neutrophils Relative %: 67.2 % (ref 43.0–77.0)
Platelets: 193 10*3/uL (ref 150.0–400.0)
RBC: 4.84 Mil/uL (ref 3.87–5.11)
RDW: 12.9 % (ref 11.5–15.5)
WBC: 5.7 10*3/uL (ref 4.0–10.5)

## 2022-08-16 LAB — VITAMIN D 25 HYDROXY (VIT D DEFICIENCY, FRACTURES): VITD: 41.01 ng/mL (ref 30.00–100.00)

## 2022-08-16 NOTE — Addendum Note (Signed)
Addended by: Marcina Millard on: 08/16/2022 02:03 PM   Modules accepted: Orders

## 2022-08-16 NOTE — Assessment & Plan Note (Signed)
Chronic Follows with cardiology On Eliquis 5 mg twice daily, metoprolol 25 mg twice daily CBC, CMP

## 2022-08-16 NOTE — Assessment & Plan Note (Signed)
Chronic GERD controlled Continue omeprazole 20 mg daily  

## 2022-08-16 NOTE — Assessment & Plan Note (Signed)
Chronic Blood pressure well controlled CMP Continue losartan 50 mg daily, metoprolol 25 mg twice daily

## 2022-08-16 NOTE — Assessment & Plan Note (Signed)
Chronic Regular exercise and healthy diet encouraged Check lipid panel  Continue Crestor 10 mg daily 

## 2022-08-16 NOTE — Assessment & Plan Note (Signed)
Chronic CMP, CBC 

## 2022-08-16 NOTE — Assessment & Plan Note (Addendum)
Chronic Continue Prolia every 6 months -due next month Continue calcium and vitamin D supplementation CBC, CMP, vitamin D level today Unfortunately she is sedentary due to chronic back pain

## 2022-08-16 NOTE — Assessment & Plan Note (Signed)
Chronic Continue furosemide 40 mg daily-can take 20 mg daily as needed in addition if increased swelling She elevates her legs, wears compression socks and weighs herself daily CMP

## 2022-08-16 NOTE — Assessment & Plan Note (Signed)
Chronic Check a1c Low sugar / carb diet Encouraged as much activity as possible

## 2022-08-17 LAB — HEMOGLOBIN A1C: Hgb A1c MFr Bld: 6 % (ref 4.6–6.5)

## 2022-08-30 ENCOUNTER — Ambulatory Visit (HOSPITAL_COMMUNITY): Payer: Medicare HMO | Attending: Cardiology

## 2022-08-30 DIAGNOSIS — I340 Nonrheumatic mitral (valve) insufficiency: Secondary | ICD-10-CM | POA: Diagnosis not present

## 2022-08-30 DIAGNOSIS — I079 Rheumatic tricuspid valve disease, unspecified: Secondary | ICD-10-CM | POA: Diagnosis not present

## 2022-08-30 LAB — ECHOCARDIOGRAM COMPLETE
Area-P 1/2: 6.48 cm2
S' Lateral: 2.2 cm

## 2022-09-07 ENCOUNTER — Telehealth: Payer: Self-pay | Admitting: Internal Medicine

## 2022-09-07 NOTE — Telephone Encounter (Signed)
Left message for patient to call back and schedule Medicare Annual Wellness Visit (AWV).   Please offer to do virtually or by telephone.  Left office number and my jabber [phone].  Last AWV:08/04/2021  Please schedule at anytime with Nurse Health Advisor.

## 2022-09-20 ENCOUNTER — Ambulatory Visit: Payer: Medicare HMO

## 2022-09-20 ENCOUNTER — Encounter: Payer: Self-pay | Admitting: Internal Medicine

## 2022-09-20 DIAGNOSIS — M810 Age-related osteoporosis without current pathological fracture: Secondary | ICD-10-CM | POA: Diagnosis not present

## 2022-09-20 MED ORDER — DENOSUMAB 60 MG/ML ~~LOC~~ SOSY
60.0000 mg | PREFILLED_SYRINGE | Freq: Once | SUBCUTANEOUS | Status: AC
  Administered 2022-09-20: 60 mg via SUBCUTANEOUS

## 2022-09-20 NOTE — Progress Notes (Signed)
After obtaining consent, and per orders of Dr. Quay Burow, injection of Prolia given the right arm subcut by Marrian Salvage. Patient instructed to report any adverse reaction to me immediately.

## 2022-10-07 ENCOUNTER — Encounter: Payer: Self-pay | Admitting: Cardiology

## 2022-10-08 ENCOUNTER — Telehealth: Payer: Medicare HMO

## 2022-10-11 ENCOUNTER — Telehealth: Payer: Medicare HMO

## 2022-10-11 NOTE — Telephone Encounter (Signed)
Forwarding to Rx prior auth team

## 2022-10-11 NOTE — Telephone Encounter (Signed)
Last Prolia injection 09/20/22 Next Prolia injection due 03/20/23

## 2022-10-25 DIAGNOSIS — Z1231 Encounter for screening mammogram for malignant neoplasm of breast: Secondary | ICD-10-CM | POA: Diagnosis not present

## 2022-10-25 LAB — HM MAMMOGRAPHY

## 2022-10-27 ENCOUNTER — Encounter: Payer: Self-pay | Admitting: Internal Medicine

## 2022-10-27 NOTE — Progress Notes (Signed)
Outside notes received. Information abstracted. Notes sent to scan.  

## 2022-10-28 DIAGNOSIS — T148XXA Other injury of unspecified body region, initial encounter: Secondary | ICD-10-CM | POA: Diagnosis not present

## 2022-10-28 DIAGNOSIS — I872 Venous insufficiency (chronic) (peripheral): Secondary | ICD-10-CM | POA: Diagnosis not present

## 2022-10-28 DIAGNOSIS — D225 Melanocytic nevi of trunk: Secondary | ICD-10-CM | POA: Diagnosis not present

## 2022-10-28 DIAGNOSIS — Z1283 Encounter for screening for malignant neoplasm of skin: Secondary | ICD-10-CM | POA: Diagnosis not present

## 2022-11-08 DIAGNOSIS — Z79891 Long term (current) use of opiate analgesic: Secondary | ICD-10-CM | POA: Diagnosis not present

## 2022-11-08 DIAGNOSIS — M4310 Spondylolisthesis, site unspecified: Secondary | ICD-10-CM | POA: Diagnosis not present

## 2022-11-08 DIAGNOSIS — Z79899 Other long term (current) drug therapy: Secondary | ICD-10-CM | POA: Diagnosis not present

## 2022-11-08 DIAGNOSIS — M5136 Other intervertebral disc degeneration, lumbar region: Secondary | ICD-10-CM | POA: Diagnosis not present

## 2022-11-08 DIAGNOSIS — Z5181 Encounter for therapeutic drug level monitoring: Secondary | ICD-10-CM | POA: Diagnosis not present

## 2022-11-15 ENCOUNTER — Emergency Department (HOSPITAL_BASED_OUTPATIENT_CLINIC_OR_DEPARTMENT_OTHER): Payer: Medicare HMO | Admitting: Radiology

## 2022-11-15 ENCOUNTER — Other Ambulatory Visit: Payer: Self-pay

## 2022-11-15 ENCOUNTER — Emergency Department (HOSPITAL_BASED_OUTPATIENT_CLINIC_OR_DEPARTMENT_OTHER)
Admission: EM | Admit: 2022-11-15 | Discharge: 2022-11-15 | Disposition: A | Discharge status: Home / Self Care | Payer: Medicare HMO | Attending: Emergency Medicine | Admitting: Emergency Medicine

## 2022-11-15 ENCOUNTER — Emergency Department (HOSPITAL_BASED_OUTPATIENT_CLINIC_OR_DEPARTMENT_OTHER): Payer: Medicare HMO

## 2022-11-15 DIAGNOSIS — S81812A Laceration without foreign body, left lower leg, initial encounter: Secondary | ICD-10-CM | POA: Diagnosis not present

## 2022-11-15 DIAGNOSIS — S0990XA Unspecified injury of head, initial encounter: Secondary | ICD-10-CM | POA: Insufficient documentation

## 2022-11-15 DIAGNOSIS — R000 Tachycardia, unspecified: Secondary | ICD-10-CM | POA: Diagnosis not present

## 2022-11-15 DIAGNOSIS — Z7901 Long term (current) use of anticoagulants: Secondary | ICD-10-CM | POA: Insufficient documentation

## 2022-11-15 DIAGNOSIS — R102 Pelvic and perineal pain: Secondary | ICD-10-CM | POA: Diagnosis not present

## 2022-11-15 DIAGNOSIS — S40811A Abrasion of right upper arm, initial encounter: Secondary | ICD-10-CM | POA: Insufficient documentation

## 2022-11-15 DIAGNOSIS — W19XXXA Unspecified fall, initial encounter: Secondary | ICD-10-CM

## 2022-11-15 DIAGNOSIS — M542 Cervicalgia: Secondary | ICD-10-CM

## 2022-11-15 DIAGNOSIS — M19041 Primary osteoarthritis, right hand: Secondary | ICD-10-CM | POA: Diagnosis not present

## 2022-11-15 DIAGNOSIS — Z043 Encounter for examination and observation following other accident: Secondary | ICD-10-CM | POA: Diagnosis not present

## 2022-11-15 DIAGNOSIS — R079 Chest pain, unspecified: Secondary | ICD-10-CM | POA: Diagnosis not present

## 2022-11-15 DIAGNOSIS — W109XXA Fall (on) (from) unspecified stairs and steps, initial encounter: Secondary | ICD-10-CM | POA: Diagnosis not present

## 2022-11-15 DIAGNOSIS — M533 Sacrococcygeal disorders, not elsewhere classified: Secondary | ICD-10-CM | POA: Diagnosis not present

## 2022-11-15 MED ORDER — ACETAMINOPHEN 500 MG PO TABS
1000.0000 mg | ORAL_TABLET | Freq: Once | ORAL | Status: AC
  Administered 2022-11-15: 1000 mg via ORAL
  Filled 2022-11-15: qty 2

## 2022-11-15 MED ORDER — DICLOFENAC SODIUM 1 % EX GEL: 4.0000 g | Freq: Four times a day (QID) | CUTANEOUS | 0 refills | Status: DC

## 2022-11-15 NOTE — ED Provider Notes (Addendum)
[facility] EMERGENCY DEPT Provider Note   CSN: [id number] Arrival date & time: 11/15/22  1633     History  Chief Complaint  Patient presents with   Fall   Head Injury    JHADA RISK is a 86 y.o. female.  86 yo F with a chief complaint of a fall.  The patient states that she was at the top of the stairs and she lost her balance and fell down backwards a couple steps.  She struck the back of her head and bumped her right elbow and has a skin tear to her left shin.  She denies confusion denies vomiting denies chest pain denies abdominal pain.  She does have some pain to the left posterior shoulder.   Fall  Head Injury      Home Medications Prior to Admission medications   Medication Sig Start Date End Date Taking? Authorizing Provider  albuterol (VENTOLIN HFA) 108 (90 Base) MCG/ACT inhaler INHALE 2 PUFFS BY MOUTH EVERY 6 HOURS AS NEEDED FOR WHEEZE OR SHORTNESS OF BREATH 10/05/21   Binnie Rail, MD  Ascorbic Acid (VITAMIN C) 100 MG tablet Take 100 mg by mouth daily.    [provider]  augmented betamethasone dipropionate (DIPROLENE-AF) 0.05 % cream Apply topically as directed. 11/10/20   [provider]  calcium citrate (CALCITRATE - DOSED IN MG ELEMENTAL CALCIUM) 950 (200 Ca) MG tablet Take 200 mg of elemental calcium by mouth 2 (two) times daily.    [provider]  Cholecalciferol (VITAMIN D3) 1000 UNITS CAPS Take 1,000 Units by mouth daily.     [provider]  Coenzyme Q10 (CO Q 10 PO) Take by mouth daily.    [provider]  denosumab (PROLIA) 60 MG/ML SOSY injection Inject 60 mg into the skin every 6 (six) months.    [provider]  dicyclomine (BENTYL) 20 MG tablet Take 1 tablet (20 mg total) by mouth every 6 (six) hours as needed. for pain 12/08/21   Irene Shipper, MD  ELIQUIS 5 MG TABS tablet TAKE 1 TABLET BY MOUTH TWICE A DAY 01/25/22   Lelon Perla, MD  fexofenadine (ALLEGRA) 180 MG tablet  Take 1 tablet (180 mg total) by mouth daily. 08/03/21   Binnie Rail, MD  furosemide (LASIX) 40 MG tablet TAKE 1 TABLET BY MOUTH DAILY. TAKE AN EXTRA 1/2 TABLET DAILY AS NEEDED 08/16/22   Binnie Rail, MD  hydroxypropyl methylcellulose / hypromellose (ISOPTO TEARS / GONIOVISC) 2.5 % ophthalmic solution 1 drop.    [provider]  Lactobacillus (PROBIOTIC ACIDOPHILUS PO) Take 1 capsule by mouth daily.    [provider]  losartan (COZAAR) 50 MG tablet TAKE 1 TABLET BY MOUTH EVERY DAY 08/12/22   Lelon Perla, MD  metoprolol tartrate (LOPRESSOR) 25 MG tablet Take 1 tablet (25 mg total) by mouth 2 (two) times daily. 08/09/22   Lelon Perla, MD  omeprazole (PRILOSEC) 20 MG capsule TAKE 1 CAPSULE BY MOUTH EVERY DAY 12/08/21   Irene Shipper, MD  rosuvastatin (CRESTOR) 10 MG tablet TAKE 1 TABLET BY MOUTH EVERY DAY 05/19/22   Lelon Perla, MD  traMADol (ULTRAM) 50 MG tablet Take 1 tablet (50 mg total) by mouth every 6 (six) hours as needed. Dr.Ramos Patient taking differently: Take 50 mg by mouth every 6 (six) hours as needed. Dr.Ramos; pt takes 1 tablet every night, maybe 1 during the day as needed 04/10/15   Nani Skillern, PA-C  Allergies    Oxybutynin chloride, Propoxyphene n-acetaminophen, Alendronate sodium, and Ramipril    Review of Systems   Review of Systems  Physical Exam Updated Vital Signs BP 134/75   Pulse (!) 111   Temp 98 F (36.7 C) (Temporal)   Resp 17   Ht '5\' 4"'$  (1.626 m)   Wt 63 kg   SpO2 96%   BMI 23.84 kg/m  Physical Exam Vitals and nursing note reviewed.  Constitutional:      General: She is not in acute distress.    Appearance: She is well-developed. She is not diaphoretic.  HENT:     Head: Normocephalic and atraumatic.  Eyes:     Pupils: Pupils are equal, round, and reactive to light.  Cardiovascular:     Rate and Rhythm: Tachycardia present. Rhythm irregular.     Heart sounds: No murmur heard.    No friction rub. No  gallop.  Pulmonary:     Effort: Pulmonary effort is normal.     Breath sounds: No wheezing or rales.  Abdominal:     General: There is no distension.     Palpations: Abdomen is soft.     Tenderness: There is no abdominal tenderness.  Musculoskeletal:        General: No tenderness.     Cervical back: Normal range of motion and neck supple.     Comments: Multiple abrasions over the right arm.  Full range of motion of the elbow able to supinate and pronate without discomfort no obvious pain at the wrist.  She has some bruising to the palmar aspect of the right second digit distal to the DIP.  Full range of motion.  She has some pain overlying the left scapula.  No significant discomfort along the midline spine.  No step-offs or deformities.  Able to rotate her head 45 degrees in either direction without pain.  She has a skin tear to the left lower leg.  She has a chronic deformity to the left ankle.  No obvious pain with palpation of the ankle.  Able to range without issue.  Skin:    General: Skin is warm and dry.  Neurological:     Mental Status: She is alert and oriented to person, place, and time.  Psychiatric:        Behavior: Behavior normal.     ED Results / Procedures / Treatments   Labs (all labs ordered are listed, but only abnormal results are displayed) Labs Reviewed - No data to display  EKG None  Radiology DG Hand Complete Right  Result Date: 11/15/2022 CLINICAL DATA:  chest pain post fall EXAM: RIGHT HAND - COMPLETE 3+ VIEW COMPARISON:  None Available. FINDINGS: No fracture or dislocation. Moderate DJD at the STT and first [facility] articulations. Spurring from the head of the second metacarpal. IMPRESSION: Degenerative changes as above. No acute findings. Electronically Signed   By: Lucrezia Europe M.D.   On: 11/15/2022 17:54   DG Shoulder Left  Result Date: 11/15/2022 CLINICAL DATA:  chest pain post fall EXAM: LEFT SHOULDER - 2+ VIEW COMPARISON:  None Available. FINDINGS:  There is no evidence of fracture or dislocation. Early marginal spur from the humeral head. Sternotomy wires. Anterior vertebral endplate spurring at multiple levels in the mid thoracic spine. IMPRESSION: No acute findings. Mild glenohumeral DJD. Electronically Signed   By: Lucrezia Europe M.D.   On: 11/15/2022 17:53   DG Ribs Unilateral W/Chest Left  Result Date: 11/15/2022 CLINICAL DATA:  chest pain  post fall EXAM: LEFT RIBS AND CHEST - 3+ VIEW COMPARISON:  06/04/2015 FINDINGS: no pneumothorax or effusion. No displaced rib fracture. Lungs are clear. Heart size normal. Sternotomy wires. Aortic Atherosclerosis (ICD10-170.0). Anterior vertebral endplate spurring at multiple levels in the lower thoracic spine. IMPRESSION: Negative. Electronically Signed   By: Lucrezia Europe M.D.   On: 11/15/2022 17:52   CT Head Wo Contrast  Result Date: 11/15/2022 CLINICAL DATA:  Head trauma, minor (Age >= 65y). Fall down stairs, head injury, chronic anticoagulation EXAM: CT HEAD WITHOUT CONTRAST CT CERVICAL SPINE WITHOUT CONTRAST TECHNIQUE: Multidetector CT imaging of the head and cervical spine was performed following the standard protocol without intravenous contrast. Multiplanar CT image reconstructions of the cervical spine were also generated. RADIATION DOSE REDUCTION: This exam was performed according to the departmental dose-optimization program which includes automated exposure control, adjustment of the mA and/or kV according to patient size and/or use of iterative reconstruction technique. COMPARISON:  None Available. FINDINGS: CT HEAD FINDINGS Brain: Normal anatomic configuration. Parenchymal volume loss is commensurate with the patient's age. Mild periventricular white matter changes are present likely reflecting the sequela of small vessel ischemia. No abnormal intra or extra-axial mass lesion or fluid collection. No abnormal mass effect or midline shift. No evidence of acute intracranial hemorrhage or infarct.  Ventricular size is normal. Cerebellum unremarkable. Vascular: No asymmetric hyperdense vasculature at the skull base. Skull: Intact Sinuses/Orbits: Paranasal sinuses are clear. Orbits are unremarkable. Other: Mastoid air cells and middle ear cavities are clear. Small left parietal scalp hematoma at the vertex. CT CERVICAL SPINE FINDINGS Alignment: 3 mm anterolisthesis C4-5 is likely degenerative in nature. Similarly 2 mm retrolisthesis C6-7 is likely degenerative . Skull base and vertebrae: Craniocervical alignment is normal. The atlantodental interval is not widened. No acute fracture of the cervical spine. Vertebral body height is preserved. Soft tissues and spinal canal: No prevertebral fluid or swelling. No visible canal hematoma. Disc levels: There is intervertebral disc space narrowing and endplate remodeling of C9-O7 in keeping with changes of mild to moderate degenerative disc disease, most severe at C5-C7. Prevertebral soft tissues are not thickened on sagittal reformats. The spinal canal is widely patent. Multilevel uncovertebral and facet arthrosis results in multilevel mild-to-moderate neuroforaminal narrowing, most severe on the left at C6-7 and on the right at C3-4 and C4-5 Upper chest: Negative. Other: None IMPRESSION: 1. No acute intracranial abnormality. No calvarial fracture. Small left parietal scalp hematoma at the vertex. 2. No acute fracture or listhesis of the cervical spine. Electronically Signed   By: Fidela Salisbury M.D.   On: 11/15/2022 17:44   CT CERVICAL SPINE WO CONTRAST  Result Date: 11/15/2022 CLINICAL DATA:  Head trauma, minor (Age >= 65y). Fall down stairs, head injury, chronic anticoagulation EXAM: CT HEAD WITHOUT CONTRAST CT CERVICAL SPINE WITHOUT CONTRAST TECHNIQUE: Multidetector CT imaging of the head and cervical spine was performed following the standard protocol without intravenous contrast. Multiplanar CT image reconstructions of the cervical spine were also generated.  RADIATION DOSE REDUCTION: This exam was performed according to the departmental dose-optimization program which includes automated exposure control, adjustment of the mA and/or kV according to patient size and/or use of iterative reconstruction technique. COMPARISON:  None Available. FINDINGS: CT HEAD FINDINGS Brain: Normal anatomic configuration. Parenchymal volume loss is commensurate with the patient's age. Mild periventricular white matter changes are present likely reflecting the sequela of small vessel ischemia. No abnormal intra or extra-axial mass lesion or fluid collection. No abnormal mass effect or midline shift. No  evidence of acute intracranial hemorrhage or infarct. Ventricular size is normal. Cerebellum unremarkable. Vascular: No asymmetric hyperdense vasculature at the skull base. Skull: Intact Sinuses/Orbits: Paranasal sinuses are clear. Orbits are unremarkable. Other: Mastoid air cells and middle ear cavities are clear. Small left parietal scalp hematoma at the vertex. CT CERVICAL SPINE FINDINGS Alignment: 3 mm anterolisthesis C4-5 is likely degenerative in nature. Similarly 2 mm retrolisthesis C6-7 is likely degenerative . Skull base and vertebrae: Craniocervical alignment is normal. The atlantodental interval is not widened. No acute fracture of the cervical spine. Vertebral body height is preserved. Soft tissues and spinal canal: No prevertebral fluid or swelling. No visible canal hematoma. Disc levels: There is intervertebral disc space narrowing and endplate remodeling of K1-S0 in keeping with changes of mild to moderate degenerative disc disease, most severe at C5-C7. Prevertebral soft tissues are not thickened on sagittal reformats. The spinal canal is widely patent. Multilevel uncovertebral and facet arthrosis results in multilevel mild-to-moderate neuroforaminal narrowing, most severe on the left at C6-7 and on the right at C3-4 and C4-5 Upper chest: Negative. Other: None IMPRESSION: 1. No  acute intracranial abnormality. No calvarial fracture. Small left parietal scalp hematoma at the vertex. 2. No acute fracture or listhesis of the cervical spine. Electronically Signed   By: Fidela Salisbury M.D.   On: 11/15/2022 17:44    Procedures Procedures    Medications Ordered in ED Medications  acetaminophen (TYLENOL) tablet 1,000 mg (1,000 mg Oral Given 11/15/22 1748)    ED Course/ Medical Decision Making/ A&P                           Medical Decision Making Amount and/or Complexity of Data Reviewed Radiology: ordered.  Risk OTC drugs.   86 yo F with a chief complaints of a fall.  The patient had lost her balance.  The patient complaining mostly of striking her head.  Has multiple other areas of discomfort.  Will obtain a CT of the head C-spine plain film of the right hand left shoulder chest.  She had a skin tear to the left shin, unfortunately had just a very small amounts of attachment to healthy skin.  Was removed.  Tdap last updated in 2018 on my record review.  Family is here to provide further history.  CT of the head and C-spine are negative for acute intercranial or intraspinal pathology.  Chest x-ray independently interpreted by me without focal infiltrates or obvious rib fracture or pneumothorax.  Plain film of the left shoulder without scapular fracture.  Will discharge home.  PCP follow-up.  6:50 PM I was notified by the patient that she started having some pain about the left pelvis.  No obvious hip pain on my repeat exam.  Plain film of the pelvis was obtained without obvious fracture on my independent interpretation.  5:59 PM:  I have discussed the diagnosis/risks/treatment options with the patient and family.  Evaluation and diagnostic testing in the emergency department does not suggest an emergent condition requiring admission or immediate intervention beyond what has been performed at this time.  They will follow up with PCP. We also discussed returning to  the ED immediately if new or worsening sx occur. We discussed the sx which are most concerning (e.g., sudden worsening pain, fever, inability to tolerate by mouth) that necessitate immediate return. Medications administered to the patient during their visit and any new prescriptions provided to the patient are listed below.  Medications given  during this visit Medications  acetaminophen (TYLENOL) tablet 1,000 mg (1,000 mg Oral Given 11/15/22 1748)     The patient appears reasonably screen and/or stabilized for discharge and I doubt any other medical condition or other [facility] requiring further screening, evaluation, or treatment in the ED at this time prior to discharge.          Final Clinical Impression(s) / ED Diagnoses Final diagnoses:  Fall, initial encounter  Skin tear of left lower leg without complication, initial encounter    Rx / DC Orders ED Discharge Orders     None         Deno Etienne, DO 11/15/22 [place], [place], DO 11/15/22 1850

## 2022-11-15 NOTE — ED Notes (Signed)
Reviewed AVS/discharge instruction with patient. Time allotted for and all questions answered. Patient is agreeable for d/c and escorted to ed exit by staff.  

## 2022-11-15 NOTE — Discharge Instructions (Signed)
I would apply a nonadherent dressing to the wound.  Please apply an ointment to it twice a day.  Rinse it out thoroughly under the sink or shower when you get home for at least 5 minutes.  Please follow-up with your family doctor in the office.

## 2022-11-15 NOTE — ED Triage Notes (Signed)
Patient arrives with complaints of having a mechanical fall down some steps. Patient fell backwards, missing a few steps and hitting her head. Patient does take Eliquis..  Abrasions to her head and laceration to lower left leg.   Reports no pain. Alert and oriented x4 in triage

## 2022-11-16 ENCOUNTER — Encounter: Payer: Self-pay | Admitting: Internal Medicine

## 2022-11-17 ENCOUNTER — Encounter: Payer: Self-pay | Admitting: Cardiology

## 2022-11-19 ENCOUNTER — Encounter: Payer: Self-pay | Admitting: Internal Medicine

## 2022-11-22 ENCOUNTER — Other Ambulatory Visit: Payer: Self-pay | Admitting: Cardiology

## 2022-11-23 NOTE — Telephone Encounter (Signed)
Prescription refill request for Eliquis received. Indication:afib Last office visit:9/23 Scr:0.8 Age: 87 Weight:63 kg  Prescription refilled

## 2022-11-24 ENCOUNTER — Telehealth: Payer: Self-pay | Admitting: *Deleted

## 2022-11-24 NOTE — Telephone Encounter (Signed)
        Patient  visited [facility] ED on 11/15/2022  for neck pain    Telephone encounter attempt :  1ST  A HIPAA compliant voice message was left requesting a return call.  Instructed patient to call back at [phone].  [place] [phone] [address] , [place] [address] Email : [email or web address]

## 2022-11-30 ENCOUNTER — Ambulatory Visit: Payer: Medicare HMO | Admitting: Internal Medicine

## 2022-11-30 NOTE — Patient Instructions (Addendum)
        Medications changes include :   Augmentin twice daily x 10 days.       Return if symptoms worsen or fail to improve.

## 2022-11-30 NOTE — Progress Notes (Unsigned)
Subjective:    Patient ID: Valerie Rollins, female    DOB: 09/07/1936, 87 y.o.   MRN: [id number]      HPI [name] is here for No chief complaint on file.    Follow up from fall - 12/25 - went to ED after fall  - she was at the top of the stairs and she lost her balance and fell down backwards a couple of steps.  She struck the back of her head and bumped her right elbow and has a skin tear to her left shin. Some pain to left posterior shoulder.  No confusion, N/V, chest pain, abd pain.    Ct head/cervical spine - no acute bleed.  Left parietal hematoma. Xray ribs, chest, left shoulder, right hand, pelvis - no acute injury.  Left shin skin tear   Head pain  Right elbow pain  Pelvic pain  Left shoulder pain   Medications and allergies reviewed with patient and updated if appropriate.  Current Outpatient Medications on File Prior to Visit  Medication Sig Dispense Refill   albuterol (VENTOLIN HFA) 108 (90 Base) MCG/ACT inhaler INHALE 2 PUFFS BY MOUTH EVERY 6 HOURS AS NEEDED FOR WHEEZE OR SHORTNESS OF BREATH 6.7 each 5   Ascorbic Acid (VITAMIN C) 100 MG tablet Take 100 mg by mouth daily.     augmented betamethasone dipropionate (DIPROLENE-AF) 0.05 % cream Apply topically as directed.     calcium citrate (CALCITRATE - DOSED IN MG ELEMENTAL CALCIUM) 950 (200 Ca) MG tablet Take 200 mg of elemental calcium by mouth 2 (two) times daily.     Cholecalciferol (VITAMIN D3) 1000 UNITS CAPS Take 1,000 Units by mouth daily.      Coenzyme Q10 (CO Q 10 PO) Take by mouth daily.     denosumab (PROLIA) 60 MG/ML SOSY injection Inject 60 mg into the skin every 6 (six) months.     diclofenac Sodium (VOLTAREN) 1 % GEL Apply 4 g topically 4 (four) times daily. 100 g 0   dicyclomine (BENTYL) 20 MG tablet Take 1 tablet (20 mg total) by mouth every 6 (six) hours as needed. for pain 360 tablet 6   ELIQUIS 5 MG TABS tablet TAKE 1 TABLET BY MOUTH TWICE A DAY 60 tablet 5   fexofenadine (ALLEGRA) 180 MG tablet  Take 1 tablet (180 mg total) by mouth daily. 30 tablet 11   furosemide (LASIX) 40 MG tablet TAKE 1 TABLET BY MOUTH DAILY. TAKE AN EXTRA 1/2 TABLET DAILY AS NEEDED 135 tablet 1   hydroxypropyl methylcellulose / hypromellose (ISOPTO TEARS / GONIOVISC) 2.5 % ophthalmic solution 1 drop.     Lactobacillus (PROBIOTIC ACIDOPHILUS PO) Take 1 capsule by mouth daily.     losartan (COZAAR) 50 MG tablet TAKE 1 TABLET BY MOUTH EVERY DAY 90 tablet 3   metoprolol tartrate (LOPRESSOR) 25 MG tablet Take 1 tablet (25 mg total) by mouth 2 (two) times daily. 270 tablet 3   omeprazole (PRILOSEC) 20 MG capsule TAKE 1 CAPSULE BY MOUTH EVERY DAY 90 capsule 3   rosuvastatin (CRESTOR) 10 MG tablet TAKE 1 TABLET BY MOUTH EVERY DAY 90 tablet 3   traMADol (ULTRAM) 50 MG tablet Take 1 tablet (50 mg total) by mouth every 6 (six) hours as needed. Dr.Ramos (Patient taking differently: Take 50 mg by mouth every 6 (six) hours as needed. Dr.Ramos; pt takes 1 tablet every night, maybe 1 during the day as needed) 30 tablet 0   No current facility-administered medications on file  prior to visit.    Review of Systems     Objective:  There were no vitals filed for this visit. BP Readings from Last 3 Encounters:  11/15/22 130/81  08/16/22 134/72  08/09/22 130/80   Wt Readings from Last 3 Encounters:  11/15/22 138 lb 14.2 oz (63 kg)  08/16/22 139 lb (63 kg)  08/09/22 141 lb 6.4 oz (64.1 kg)   There is no height or weight on file to calculate BMI.    Physical Exam       DG Pelvis Portable CLINICAL DATA:  Fall.  Tailbone pain.  EXAM: PORTABLE PELVIS 1-2 VIEWS  COMPARISON:  None Available.  FINDINGS: No fracture.  No bone lesion.  Hip joints, SI joints and pubic symphysis are normally spaced and aligned.  Lower aspect of the sacrum and coccyx mostly obscured by overlying bowel gas and stool.  Skeletal structures are demineralized.  IMPRESSION: 1. No fracture or dislocation. 2. Mid to lower sacrum and  coccyx not well assessed on the study.  Electronically Signed   By: Lajean Manes M.D.   On: 11/15/2022 18:34 DG Hand Complete Right CLINICAL DATA:  chest pain post fall  EXAM: RIGHT HAND - COMPLETE 3+ VIEW  COMPARISON:  None Available.  FINDINGS: No fracture or dislocation. Moderate DJD at the STT and first [facility] articulations. Spurring from the head of the second metacarpal.  IMPRESSION: Degenerative changes as above. No acute findings.  Electronically Signed   By: Lucrezia Europe M.D.   On: 11/15/2022 17:54 DG Shoulder Left CLINICAL DATA:  chest pain post fall  EXAM: LEFT SHOULDER - 2+ VIEW  COMPARISON:  None Available.  FINDINGS: There is no evidence of fracture or dislocation. Early marginal spur from the humeral head. Sternotomy wires. Anterior vertebral endplate spurring at multiple levels in the mid thoracic spine.  IMPRESSION: No acute findings. Mild glenohumeral DJD.  Electronically Signed   By: Lucrezia Europe M.D.   On: 11/15/2022 17:53 DG Ribs Unilateral W/Chest Left CLINICAL DATA:  chest pain post fall  EXAM: LEFT RIBS AND CHEST - 3+ VIEW  COMPARISON:  06/04/2015  FINDINGS: no pneumothorax or effusion. No displaced rib fracture. Lungs are clear. Heart size normal. Sternotomy wires. Aortic Atherosclerosis (ICD10-170.0). Anterior vertebral endplate spurring at multiple levels in the lower thoracic spine.  IMPRESSION: Negative.  Electronically Signed   By: Lucrezia Europe M.D.   On: 11/15/2022 17:52 CT CERVICAL SPINE WO CONTRAST CLINICAL DATA:  Head trauma, minor (Age >= 65y). Fall down stairs, head injury, chronic anticoagulation  EXAM: CT HEAD WITHOUT CONTRAST  CT CERVICAL SPINE WITHOUT CONTRAST  TECHNIQUE: Multidetector CT imaging of the head and cervical spine was performed following the standard protocol without intravenous contrast. Multiplanar CT image reconstructions of the cervical spine were also generated.  RADIATION DOSE REDUCTION:  This exam was performed according to the departmental dose-optimization program which includes automated exposure control, adjustment of the mA and/or kV according to patient size and/or use of iterative reconstruction technique.  COMPARISON:  None Available.  FINDINGS: CT HEAD FINDINGS  Brain: Normal anatomic configuration. Parenchymal volume loss is commensurate with the patient's age. Mild periventricular white matter changes are present likely reflecting the sequela of small vessel ischemia. No abnormal intra or extra-axial mass lesion or fluid collection. No abnormal mass effect or midline shift. No evidence of acute intracranial hemorrhage or infarct. Ventricular size is normal. Cerebellum unremarkable.  Vascular: No asymmetric hyperdense vasculature at the skull base.  Skull: Intact  Sinuses/Orbits: Paranasal  sinuses are clear. Orbits are unremarkable.  Other: Mastoid air cells and middle ear cavities are clear. Small left parietal scalp hematoma at the vertex.  CT CERVICAL SPINE FINDINGS  Alignment: 3 mm anterolisthesis C4-5 is likely degenerative in nature. Similarly 2 mm retrolisthesis C6-7 is likely degenerative .  Skull base and vertebrae: Craniocervical alignment is normal. The atlantodental interval is not widened. No acute fracture of the cervical spine. Vertebral body height is preserved.  Soft tissues and spinal canal: No prevertebral fluid or swelling. No visible canal hematoma.  Disc levels: There is intervertebral disc space narrowing and endplate remodeling of W4-O9 in keeping with changes of mild to moderate degenerative disc disease, most severe at C5-C7. Prevertebral soft tissues are not thickened on sagittal reformats. The spinal canal is widely patent. Multilevel uncovertebral and facet arthrosis results in multilevel mild-to-moderate neuroforaminal narrowing, most severe on the left at C6-7 and on the right at C3-4 and C4-5  Upper chest:  Negative.  Other: None  IMPRESSION: 1. No acute intracranial abnormality. No calvarial fracture. Small left parietal scalp hematoma at the vertex. 2. No acute fracture or listhesis of the cervical spine.  Electronically Signed   By: Fidela Salisbury M.D.   On: 11/15/2022 17:44 CT Head Wo Contrast CLINICAL DATA:  Head trauma, minor (Age >= 65y). Fall down stairs, head injury, chronic anticoagulation  EXAM: CT HEAD WITHOUT CONTRAST  CT CERVICAL SPINE WITHOUT CONTRAST  TECHNIQUE: Multidetector CT imaging of the head and cervical spine was performed following the standard protocol without intravenous contrast. Multiplanar CT image reconstructions of the cervical spine were also generated.  RADIATION DOSE REDUCTION: This exam was performed according to the departmental dose-optimization program which includes automated exposure control, adjustment of the mA and/or kV according to patient size and/or use of iterative reconstruction technique.  COMPARISON:  None Available.  FINDINGS: CT HEAD FINDINGS  Brain: Normal anatomic configuration. Parenchymal volume loss is commensurate with the patient's age. Mild periventricular white matter changes are present likely reflecting the sequela of small vessel ischemia. No abnormal intra or extra-axial mass lesion or fluid collection. No abnormal mass effect or midline shift. No evidence of acute intracranial hemorrhage or infarct. Ventricular size is normal. Cerebellum unremarkable.  Vascular: No asymmetric hyperdense vasculature at the skull base.  Skull: Intact  Sinuses/Orbits: Paranasal sinuses are clear. Orbits are unremarkable.  Other: Mastoid air cells and middle ear cavities are clear. Small left parietal scalp hematoma at the vertex.  CT CERVICAL SPINE FINDINGS  Alignment: 3 mm anterolisthesis C4-5 is likely degenerative in nature. Similarly 2 mm retrolisthesis C6-7 is likely degenerative .  Skull base and vertebrae:  Craniocervical alignment is normal. The atlantodental interval is not widened. No acute fracture of the cervical spine. Vertebral body height is preserved.  Soft tissues and spinal canal: No prevertebral fluid or swelling. No visible canal hematoma.  Disc levels: There is intervertebral disc space narrowing and endplate remodeling of B3-Z3 in keeping with changes of mild to moderate degenerative disc disease, most severe at C5-C7. Prevertebral soft tissues are not thickened on sagittal reformats. The spinal canal is widely patent. Multilevel uncovertebral and facet arthrosis results in multilevel mild-to-moderate neuroforaminal narrowing, most severe on the left at C6-7 and on the right at C3-4 and C4-5  Upper chest: Negative.  Other: None  IMPRESSION: 1. No acute intracranial abnormality. No calvarial fracture. Small left parietal scalp hematoma at the vertex. 2. No acute fracture or listhesis of the cervical spine.  Electronically Signed  By: Fidela Salisbury M.D.   On: 11/15/2022 17:44    Assessment & Plan:    See Problem List for Assessment and Plan of chronic medical problems.

## 2022-12-01 ENCOUNTER — Encounter: Payer: Self-pay | Admitting: Internal Medicine

## 2022-12-01 ENCOUNTER — Ambulatory Visit (INDEPENDENT_AMBULATORY_CARE_PROVIDER_SITE_OTHER): Payer: Medicare HMO | Admitting: Internal Medicine

## 2022-12-01 VITALS — BP 136/82 | HR 114 | Temp 98.6°F | Ht 64.0 in | Wt 144.0 lb

## 2022-12-01 DIAGNOSIS — L089 Local infection of the skin and subcutaneous tissue, unspecified: Secondary | ICD-10-CM

## 2022-12-01 DIAGNOSIS — W19XXXA Unspecified fall, initial encounter: Secondary | ICD-10-CM

## 2022-12-01 DIAGNOSIS — R58 Hemorrhage, not elsewhere classified: Secondary | ICD-10-CM | POA: Diagnosis not present

## 2022-12-01 DIAGNOSIS — L98491 Non-pressure chronic ulcer of skin of other sites limited to breakdown of skin: Secondary | ICD-10-CM | POA: Diagnosis not present

## 2022-12-01 MED ORDER — AMOXICILLIN-POT CLAVULANATE 875-125 MG PO TABS: 1.0000 | ORAL_TABLET | Freq: Two times a day (BID) | ORAL | 0 refills | Status: DC

## 2022-12-01 MED ORDER — ALBUTEROL SULFATE HFA 108 (90 BASE) MCG/ACT IN AERS: INHALATION_SPRAY | RESPIRATORY_TRACT | 5 refills | Status: DC

## 2022-12-01 NOTE — Assessment & Plan Note (Signed)
[place] 12/25 trying to go up a few steps and lost her balance-she does have a ramp that she should be using and will no longer use those steps Discussed fall prevention

## 2022-12-01 NOTE — Assessment & Plan Note (Signed)
Acute Secondary to fall on 12/25 resulting in skin tear The size of the ulcer has decreased per daughter Concerning erythema, warmth and tenderness around the ulcer-concern for cellulitis, infected ulcer Start Augmentin 875-125 mg twice daily x 10 days Continue wound care-her daughters are doing a great job keeping the area clean and moist Continue to elevate leg, taking Lasix daily Her daughters will monitor closely and if they do not see continued improvement which they know will be slow they will let me know-could consider home nursing or referral to wound center

## 2022-12-01 NOTE — Assessment & Plan Note (Signed)
Acute On left buttock/hip, right buttock, hip and left orbit Related to recent fall Improving Minimal tenderness Can apply heat Tylenol prn

## 2022-12-04 ENCOUNTER — Other Ambulatory Visit: Payer: Self-pay | Admitting: Internal Medicine

## 2022-12-10 ENCOUNTER — Other Ambulatory Visit: Payer: Self-pay | Admitting: Internal Medicine

## 2022-12-10 ENCOUNTER — Encounter: Payer: Self-pay | Admitting: Internal Medicine

## 2022-12-14 MED ORDER — DOXYCYCLINE HYCLATE 100 MG PO TABS: 100.0000 mg | ORAL_TABLET | Freq: Two times a day (BID) | ORAL | 0 refills | Status: AC

## 2022-12-19 ENCOUNTER — Encounter: Payer: Self-pay | Admitting: Internal Medicine

## 2022-12-19 DIAGNOSIS — L97929 Non-pressure chronic ulcer of unspecified part of left lower leg with unspecified severity: Secondary | ICD-10-CM

## 2022-12-23 ENCOUNTER — Ambulatory Visit (HOSPITAL_COMMUNITY)
Admission: RE | Admit: 2022-12-23 | Discharge: 2022-12-23 | Disposition: A | Discharge status: Home / Self Care | Payer: Medicare HMO | Source: Ambulatory Visit | Attending: Internal Medicine | Admitting: Internal Medicine

## 2022-12-23 ENCOUNTER — Other Ambulatory Visit: Payer: Self-pay | Admitting: Internal Medicine

## 2022-12-23 ENCOUNTER — Encounter: Payer: Self-pay | Admitting: Internal Medicine

## 2022-12-23 DIAGNOSIS — M7989 Other specified soft tissue disorders: Secondary | ICD-10-CM

## 2022-12-23 DIAGNOSIS — L97929 Non-pressure chronic ulcer of unspecified part of left lower leg with unspecified severity: Secondary | ICD-10-CM

## 2022-12-28 ENCOUNTER — Encounter: Payer: Self-pay | Admitting: Orthopedic Surgery

## 2022-12-28 ENCOUNTER — Ambulatory Visit (INDEPENDENT_AMBULATORY_CARE_PROVIDER_SITE_OTHER): Payer: Medicare HMO | Admitting: Orthopedic Surgery

## 2022-12-28 ENCOUNTER — Ambulatory Visit (INDEPENDENT_AMBULATORY_CARE_PROVIDER_SITE_OTHER): Payer: Medicare HMO

## 2022-12-28 DIAGNOSIS — M25572 Pain in left ankle and joints of left foot: Secondary | ICD-10-CM

## 2022-12-28 DIAGNOSIS — L97221 Non-pressure chronic ulcer of left calf limited to breakdown of skin: Secondary | ICD-10-CM

## 2022-12-28 DIAGNOSIS — I83022 Varicose veins of left lower extremity with ulcer of calf: Secondary | ICD-10-CM

## 2022-12-28 NOTE — Progress Notes (Signed)
Office Visit Note   Patient: Valerie Rollins           Date of Birth: [birth year]           MRN: [id number] Visit Date: 12/28/2022              Requested by: Binnie Rail, MD [place],  [place] [address] PCP: Binnie Rail, MD  Chief Complaint  Patient presents with   Left Ankle - Wound Check      HPI: Patient is an 87 year old woman who is seen for initial evaluation for chronic nonhealing venous ulcer lateral malleolus left ankle.  Patient states that she fell around Christmas sustaining the wound has been on Augmentin twice a day for 10 days has completed a course of doxycycline as well.  Patient states that she cannot get into the wound center for 3 weeks.  Assessment & Plan: Visit Diagnoses:  1. Pain in left ankle and joints of left foot   2. Venous stasis ulcer of left calf limited to breakdown of skin with varicose veins ([place])     Plan: With the venous ulcer with a healthy granulating bed after debridement patient would be a good candidate for enrollment in our venous stasis ulcer study.  Ulcer was debrided Kerecis donated micro graft was applied covered with Adaptic 4 x 4's and a 3 layer compression wrap plan to follow-up weekly for compression wraps and we will apply for authorization for additional tissue graft.  Follow-Up Instructions: No follow-ups on file.   Ortho Exam  Patient is alert, oriented, no adenopathy, well-dressed, normal affect, normal respiratory effort. Examination patient has a palpable pulse she does have A-fib she is on Eliquis.  There is induration and redness and swelling around the calf secondary to her venous insufficiency.  The ulcer measures 3 cm in diameter with a necrotic wound bed.  After informed consent a 10 blade knife was used debride the skin and soft tissue back to healthy viable granulation tissue.  This did not probe down to bone or hardware.  After debridement the wound is 35 mm x 35 mm and 3 mm deep.  There is good  bleeding granulation tissue no tunneling no signs of abscess.  Donated Kerecis micro graft was applied with Adaptic 4 x 4 and a 3 layer compression wrap.  Imaging: XR Ankle Complete Left  Result Date: 12/28/2022 Three-view radiographs of the left ankle shows retained hardware from open reduction internal fixation bimalleolar ankle fracture the joint space shows degenerative traumatic arthritis no hardware complications.     Labs: Lab Results  Component Value Date   HGBA1C 6.0 08/16/2022   HGBA1C 5.9 02/01/2022   HGBA1C 5.8 08/03/2021   LABURIC 5.6 09/02/2010   REPTSTATUS 11/16/2011 FINAL 11/15/2011   CULT NO GROWTH 11/15/2011   LABORGA STREPTOCOCCUS GROUP D;high probability for S.bovis 02/15/2013     Lab Results  Component Value Date   ALBUMIN 4.7 08/16/2022   ALBUMIN 4.7 02/01/2022   ALBUMIN 4.5 08/03/2021    Lab Results  Component Value Date   MG 2.0 04/02/2015   MG 2.2 04/02/2015   MG 2.8 (H) 04/01/2015   Lab Results  Component Value Date   VD25OH 41.01 08/16/2022   VD25OH 36.69 02/01/2022   VD25OH 43.05 08/03/2021    No results found for: "PREALBUMIN"    Latest Ref Rng & Units 08/16/2022    1:56 PM 02/01/2022    1:45 PM 08/03/2021    1:59  PM  CBC EXTENDED  WBC 4.0 - 10.5 K/uL 5.7  6.1  5.8   RBC 3.87 - 5.11 Mil/uL 4.84  4.81  4.89   Hemoglobin 12.0 - 15.0 g/dL 14.7  14.6  14.6   HCT 36.0 - 46.0 % 43.1  42.5  43.3   Platelets 150.0 - 400.0 K/uL 193.0  199.0  197.0   NEUT# 1.4 - 7.7 K/uL 3.8  3.8  3.9   Lymph# 0.7 - 4.0 K/uL 1.3  1.6  1.3      There is no height or weight on file to calculate BMI.  Orders:  Orders Placed This Encounter  Procedures   XR Ankle Complete Left   No orders of the defined types were placed in this encounter.    Procedures: No procedures performed  Clinical Data: No additional findings.  ROS:  All other systems negative, except as noted in the HPI. Review of Systems  Objective: Vital Signs: There were no vitals  taken for this visit.  Specialty Comments:  No specialty comments available.  PMFS History: Patient Active Problem List   Diagnosis Date Noted   Infected ulcer of skin ([place]) 12/01/2022   Fall 12/01/2022   Ecchymosis 12/01/2022   Thickened nails 02/02/2021   Long-term current use of opiate analgesic 10/11/2020   Degenerative spondylolisthesis 10/11/2020   Chronic venous stasis dermatitis of both lower extremities 01/21/2020   On long term drug therapy 11/05/2019   Hypercalcemia 07/19/2019   CKD (chronic kidney disease) stage 3, GFR 30-59 ml/min 07/19/2019   Chronic low back pain 05/07/2019   Candidal intertrigo 04/20/2018   Angular stomatitis 04/20/2018   Seasonal allergic rhinitis due to pollen 03/21/2018   Bilateral leg edema 12/31/2016   Prediabetes 01/10/2016   Osteoporosis - prolia Q 6 months 12/26/2015   Mass of neck 07/07/2015   Long-term (current) use of anticoagulants 04/16/2015   S/P CABG x 1 04/01/2015   CAD (coronary artery disease) 03/28/2015   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 s/p LIMA-LAD   Diverticulitis 2002   based on CT scan   Diverticulosis    Dr Henrene Pastor    DVT (deep venous thrombosis) ([place]) 2000   no trigger   Endometriosis    Gastritis 2006   @ Endo   GERD (gastroesophageal reflux disease)    Hiatal hernia    Hyperlipidemia    Hypertension    IBS (irritable bowel syndrome)    Ocular hypertension    glaucoma suspect, Dr. Kathrin Penner   Skin cancer    Superficial phlebitis     X 2, 1999, 2000   Syncope 11/12/11   in context CAP . Carotid Doppler exam was negative. 2D  echocardiogram revealed mild dilation of the left atrium and moderate increase in the systolic pressureof  the pulmonary artery   UTI (lower urinary tract infection) 01/2013   Strep bovis ; PCN sensitive    Family History  Problem Relation Age of Onset   Alzheimer's disease Mother 70       TIAs   Heart attack Father 66   Hyperlipidemia Brother    Hypertension Brother    Hypertension Brother    Prostate cancer Brother 62   Colon cancer Maternal Grandmother    Heart attack Paternal Grandmother 82   Diverticulitis Daughter        S/P colectomy   Diabetes Neg Hx    Stroke Neg Hx    Hypercalcemia Neg Hx    Stomach cancer Neg Hx    Pancreatic cancer Neg Hx    Esophageal cancer Neg Hx     Past Surgical History:  Procedure Laterality Date   CARDIAC CATHETERIZATION N/A 03/28/2015   Procedure: Left Heart Cath and Coronary Angiography;  Surgeon: Jolaine Artist, MD;  Location: [facility];  Service: Cardiovascular;  Laterality: N/A;   CATARACT EXTRACTION Bilateral    CATARACT EXTRACTION W/ INTRAOCULAR LENS IMPLANT     OS; Dr Kathrin Penner   COLONOSCOPY  1995, 2002, 2012   diverticulosis; Dr Henrene Pastor   CORONARY ARTERY BYPASS GRAFT N/A 04/01/2015   Procedure: CORONARY ARTERY BYPASS GRAFTING (CABG), ON PUMP, TIMES ONE, USING LEFT INTERNAL MAMMARY ARTERY;  Surgeon: Ivin Poot, MD;  Location: [facility];  Service: Open Heart Surgery;  Laterality: N/A;   EXCISION OF ATRIAL MYXOMA N/A 04/01/2015   Procedure: EXCISION OF ATRIAL MYXOMA;  Surgeon: Ivin Poot, MD;   Location: [facility];  Service: Open Heart Surgery;  Laterality: N/A;   ORIF ANKLE FRACTURE  11/14/2011   Procedure: OPEN REDUCTION INTERNAL FIXATION (ORIF) ANKLE FRACTURE;  Surgeon: Colin Rhein;  Location: [facility];  Service: Orthopedics;  Laterality: Left;  open reduction internal fixation trimalleolar ankle fracture   TEE WITHOUT CARDIOVERSION N/A 04/01/2015   Procedure: TRANSESOPHAGEAL ECHOCARDIOGRAM (TEE);  Surgeon: Ivin Poot, MD;  Location: [facility];  Service: Open Heart Surgery;  Laterality: N/A;   TONSILLECTOMY     TOTAL ABDOMINAL HYSTERECTOMY W/ BILATERAL SALPINGOOPHORECTOMY  1980   dysfunctional menses, endometriosis   VARICOSE VEIN SURGERY     1960, 1965, 2009   Social History   Occupational History   Occupation: Retired    Fish farm manager: RETIRED  Tobacco Use   Smoking status: Never   Smokeless tobacco: Never  Vaping Use   Vaping Use: Never used  Substance and Sexual Activity   Alcohol use: No   Drug use: No   Sexual activity: Not on file

## 2023-01-04 ENCOUNTER — Ambulatory Visit: Payer: Medicare HMO | Admitting: Orthopedic Surgery

## 2023-01-04 ENCOUNTER — Encounter: Payer: Self-pay | Admitting: Orthopedic Surgery

## 2023-01-04 DIAGNOSIS — L97221 Non-pressure chronic ulcer of left calf limited to breakdown of skin: Secondary | ICD-10-CM

## 2023-01-04 DIAGNOSIS — I83022 Varicose veins of left lower extremity with ulcer of calf: Secondary | ICD-10-CM

## 2023-01-04 NOTE — Progress Notes (Signed)
Office Visit Note   Patient: Valerie Rollins           Date of Birth: 03/25/1936           MRN: [id number] Visit Date: 01/04/2023              Requested by: Binnie Rail, MD [place],  [place] [address] PCP: Binnie Rail, MD  Chief Complaint  Patient presents with   Left Ankle - Wound Check    Registry pt #9 visit #2      HPI: Patient is an 87 year old woman who presents in follow-up status post Kerecis tissue graft for venous insufficiency ulcer.  Assessment & Plan: Visit Diagnoses:  1. Venous stasis ulcer of left calf limited to breakdown of skin with varicose veins (HCC)     Plan: The ulcer has improved significantly.  Will reapply a Dynaflex compression wrap and follow-up in 1 week.  Follow-Up Instructions: Return in about 1 week (around 01/11/2023).   Ortho Exam  Patient is alert, oriented, no adenopathy, well-dressed, normal affect, normal respiratory effort. Examination the swelling is resolving there is no maceration or dermatitis.  The wound measures 15 x 15 mm and is 1 mm deep.  There is healthy granulation tissue.  No odor or drainage no pain.  Imaging: No results found.    Labs: Lab Results  Component Value Date   HGBA1C 6.0 08/16/2022   HGBA1C 5.9 02/01/2022   HGBA1C 5.8 08/03/2021   LABURIC 5.6 09/02/2010   REPTSTATUS 11/16/2011 FINAL 11/15/2011   CULT NO GROWTH 11/15/2011   LABORGA STREPTOCOCCUS GROUP D;high probability for S.bovis 02/15/2013     Lab Results  Component Value Date   ALBUMIN 4.7 08/16/2022   ALBUMIN 4.7 02/01/2022   ALBUMIN 4.5 08/03/2021    Lab Results  Component Value Date   MG 2.0 04/02/2015   MG 2.2 04/02/2015   MG 2.8 (H) 04/01/2015   Lab Results  Component Value Date   VD25OH 41.01 08/16/2022   VD25OH 36.69 02/01/2022   VD25OH 43.05 08/03/2021    No results found for: "PREALBUMIN"    Latest Ref Rng & Units 08/16/2022    1:56 PM 02/01/2022    1:45 PM 08/03/2021    1:59 PM  CBC EXTENDED   WBC 4.0 - 10.5 K/uL 5.7  6.1  5.8   RBC 3.87 - 5.11 Mil/uL 4.84  4.81  4.89   Hemoglobin 12.0 - 15.0 g/dL 14.7  14.6  14.6   HCT 36.0 - 46.0 % 43.1  42.5  43.3   Platelets 150.0 - 400.0 K/uL 193.0  199.0  197.0   NEUT# 1.4 - 7.7 K/uL 3.8  3.8  3.9   Lymph# 0.7 - 4.0 K/uL 1.3  1.6  1.3      There is no height or weight on file to calculate BMI.  Orders:  No orders of the defined types were placed in this encounter.  No orders of the defined types were placed in this encounter.    Procedures: No procedures performed  Clinical Data: No additional findings.  ROS:  All other systems negative, except as noted in the HPI. Review of Systems  Objective: Vital Signs: There were no vitals taken for this visit.  Specialty Comments:  No specialty comments available.  PMFS History: Patient Active Problem List   Diagnosis Date Noted   Infected ulcer of skin ([place]) 12/01/2022   Fall 12/01/2022   Ecchymosis 12/01/2022   Thickened nails  02/02/2021   Long-term current use of opiate analgesic 10/11/2020   Degenerative spondylolisthesis 10/11/2020   Chronic venous stasis dermatitis of both lower extremities 01/21/2020   On long term drug therapy 11/05/2019   Hypercalcemia 07/19/2019   CKD (chronic kidney disease) stage 3, GFR 30-59 ml/min 07/19/2019   Chronic low back pain 05/07/2019   Candidal intertrigo 04/20/2018   Angular stomatitis 04/20/2018   Seasonal allergic rhinitis due to pollen 03/21/2018   Bilateral leg edema 12/31/2016   Prediabetes 01/10/2016   Osteoporosis - prolia Q 6 months 12/26/2015   Mass of neck 07/07/2015   Long-term (current) use of anticoagulants 04/16/2015   S/P CABG x 1 04/01/2015   CAD (coronary artery disease) 03/28/2015   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 Crenshaw 11/15/2011   Syncope 11/12/2011   RBBB (right bundle branch block) 11/12/2011    SKIN CANCER, HX OF 11/18/2009   Diverticulosis of large intestine 10/31/2008   VARICOSE VEINS, LOWER EXTREMITIES 03/13/2008   GERD 02/20/2008   IBS 02/20/2008   Osteoarthritis 02/20/2008   Hyperlipidemia 12/01/2006   Essential hypertension 12/01/2006   SUPERFICIAL THROMBOPHLEBITIS 12/01/2006   DVT, HX OF 12/01/2006   Past Medical History:  Diagnosis Date   Ankle fracture, left 11/12/2011   Atrial fibrillation ([place])    Post op after repair of ankle fracture   Atrial myxoma 03/2015   Resected at the time of CABG with LAA ligation   CAD (coronary artery disease) 03/2015   LAD 80%, otherwise minimal CAD. s/p LIMA-LAD   Diverticulitis 2002   based on CT scan   Diverticulosis    Dr Henrene Pastor   DVT (deep venous thrombosis) ([place]) 2000   no trigger   Endometriosis    Gastritis 2006   @ Endo   GERD (gastroesophageal reflux disease)    Hiatal hernia    Hyperlipidemia    Hypertension    IBS (irritable bowel syndrome)    Ocular hypertension    glaucoma suspect, Dr. Kathrin Penner   Skin cancer    Superficial phlebitis     X 2, 1999, 2000   Syncope 11/12/11   in context CAP . Carotid Doppler exam was negative. 2D echocardiogram revealed mild dilation of the left atrium and moderate increase in the systolic pressureof  the pulmonary artery   UTI (lower urinary tract infection) 01/2013   Strep bovis ; PCN sensitive    Family History  Problem Relation Age of Onset   Alzheimer's disease Mother 60       TIAs   Heart attack Father 25   Hyperlipidemia Brother    Hypertension Brother    Hypertension Brother    Prostate cancer Brother 88   Colon cancer Maternal Grandmother    Heart attack Paternal Grandmother 82   Diverticulitis Daughter        S/P colectomy   Diabetes Neg Hx    Stroke Neg Hx    Hypercalcemia Neg Hx    Stomach cancer Neg Hx    Pancreatic cancer Neg Hx    Esophageal cancer Neg Hx     Past Surgical History:  Procedure Laterality Date   CARDIAC CATHETERIZATION N/A  03/28/2015   Procedure: Left Heart Cath and Coronary Angiography;  Surgeon: Jolaine Artist, MD;  Location: [facility]AS[facility];  Service: Cardiovascular;  Laterality: N/A;   CATARACT EXTRACTION Bilateral    CATARACT EXTRACTION W/ INTRAOCULAR LENS IMPLANT  OS; Dr Kathrin Penner   COLONOSCOPY  1995, 2002, 2012   diverticulosis; Dr Henrene Pastor   CORONARY ARTERY BYPASS GRAFT N/A 04/01/2015   Procedure: CORONARY ARTERY BYPASS GRAFTING (CABG), ON PUMP, TIMES ONE, USING LEFT INTERNAL MAMMARY ARTERY;  Surgeon: Ivin Poot, MD;  Location: [facility];  Service: Open Heart Surgery;  Laterality: N/A;   EXCISION OF ATRIAL MYXOMA N/A 04/01/2015   Procedure: EXCISION OF ATRIAL MYXOMA;  Surgeon: Ivin Poot, MD;  Location: [facility];  Service: Open Heart Surgery;  Laterality: N/A;   ORIF ANKLE FRACTURE  11/14/2011   Procedure: OPEN REDUCTION INTERNAL FIXATION (ORIF) ANKLE FRACTURE;  Surgeon: Colin Rhein;  [facility];  Service: Orthopedics;  Laterality: Left;  open reduction internal fixation trimalleolar ankle fracture   TEE WITHOUT CARDIOVERSION N/A 04/01/2015   Procedure: TRANSESOPHAGEAL ECHOCARDIOGRAM (TEE);  Surgeon: Ivin Poot, MD;  Location: [facility];  Service: Open Heart Surgery;  Laterality: N/A;   TONSILLECTOMY     TOTAL ABDOMINAL HYSTERECTOMY W/ BILATERAL SALPINGOOPHORECTOMY  1980   dysfunctional menses, endometriosis   VARICOSE VEIN SURGERY     1960, 1965, 2009   Social History   Occupational History   Occupation: Retired    Fish farm manager: RETIRED  Tobacco Use   Smoking status: Never   Smokeless tobacco: Never  Vaping Use   Vaping Use: Never used  Substance and Sexual Activity   Alcohol use: No   Drug use: No   Sexual activity: Not on file

## 2023-01-06 ENCOUNTER — Telehealth: Payer: Self-pay

## 2023-01-06 NOTE — Telephone Encounter (Signed)
Triage call from the patient's daughter, Kem Kays. She says the patient is complaining of a lot of pain in the LLE - the one being treated for a wound. It has kept her from sleeping well the last 2 night. The leg was rewrapped at the ov 2 days ago at her appt with Dr. Sharol Given. Ms. Rayann Heman is asking for advice on what to do. Her number is [phone].

## 2023-01-06 NOTE — Telephone Encounter (Signed)
SW dtr, she will bring pt in tomorrow morning at 9 am for me to rewrap her leg with dynaflex. I will add her to nurse schedule.

## 2023-01-07 ENCOUNTER — Ambulatory Visit: Payer: Medicare HMO

## 2023-01-10 ENCOUNTER — Telehealth: Payer: Self-pay | Admitting: Orthopedic Surgery

## 2023-01-10 NOTE — Telephone Encounter (Signed)
Natalie from [facility] called to advise that coverage for the Kerecis skin graft has been denied (denial letter will be coming soon from [place]).  Reason for denial is that treatment is considered "experimental or investigational" under their guidelines.  Dr. Sharol Given has the option to do an expedited appeal.  If we would like to appeal, call [phone] ref# [id number].

## 2023-01-10 NOTE — Telephone Encounter (Signed)
This was for in office kerecis graft application and there is an option to do an appeal. Thi sis a duplicate message and I will sign off on this an address message that has appeal process information in the message.

## 2023-01-10 NOTE — Telephone Encounter (Signed)
[facility] states that procedure requested has been dined.Marland Kitchen

## 2023-01-10 NOTE — Telephone Encounter (Signed)
Call transferred to [facility].Marland Kitchen

## 2023-01-11 ENCOUNTER — Ambulatory Visit (HOSPITAL_BASED_OUTPATIENT_CLINIC_OR_DEPARTMENT_OTHER): Payer: Medicare HMO | Admitting: General Surgery

## 2023-01-11 ENCOUNTER — Encounter: Payer: Self-pay | Admitting: Orthopedic Surgery

## 2023-01-11 ENCOUNTER — Ambulatory Visit (INDEPENDENT_AMBULATORY_CARE_PROVIDER_SITE_OTHER): Payer: Medicare HMO | Admitting: Orthopedic Surgery

## 2023-01-11 DIAGNOSIS — L97221 Non-pressure chronic ulcer of left calf limited to breakdown of skin: Secondary | ICD-10-CM

## 2023-01-11 DIAGNOSIS — I83022 Varicose veins of left lower extremity with ulcer of calf: Secondary | ICD-10-CM | POA: Diagnosis not present

## 2023-01-11 NOTE — Telephone Encounter (Signed)
I called [employer]. The clinical information, LOMN and the denial letter from [facility] will need to be faxed to the appeals office. Fax [phone] the ref # for this call with representative Venus [id number]. Letter to be dictated by Dr. Sharol Given

## 2023-01-12 NOTE — Telephone Encounter (Signed)
LOMN uploaded to [employer]. Will continue to monitor for approval

## 2023-01-13 ENCOUNTER — Other Ambulatory Visit: Payer: Self-pay | Admitting: Cardiology

## 2023-01-13 DIAGNOSIS — I079 Rheumatic tricuspid valve disease, unspecified: Secondary | ICD-10-CM

## 2023-01-18 ENCOUNTER — Ambulatory Visit (INDEPENDENT_AMBULATORY_CARE_PROVIDER_SITE_OTHER): Payer: Medicare HMO | Admitting: Orthopedic Surgery

## 2023-01-18 DIAGNOSIS — I83022 Varicose veins of left lower extremity with ulcer of calf: Secondary | ICD-10-CM | POA: Diagnosis not present

## 2023-01-18 DIAGNOSIS — L97221 Non-pressure chronic ulcer of left calf limited to breakdown of skin: Secondary | ICD-10-CM | POA: Diagnosis not present

## 2023-01-19 ENCOUNTER — Telehealth: Payer: Self-pay

## 2023-01-19 NOTE — Telephone Encounter (Signed)
I called and sw the pt's daughter and advised that we have submitted for a benefits investigation for this pt for in office graft and it was denied by [place]. We have submitted for an appeal and we are pending that decision. The graft that was applied in the office was sample piece and she will not be billed for this. Beth was pleased with this information and advised that she will replay this information to the pt and call with any other questions.

## 2023-01-19 NOTE — Telephone Encounter (Signed)
Patient's daughter Eustaquio Maize called concerning a denial for a service through [employer].  Would like a call back.  Cb# [phone].  Please advise.  Thank you.

## 2023-01-21 ENCOUNTER — Telehealth: Payer: Self-pay | Admitting: Orthopedic Surgery

## 2023-01-21 NOTE — Telephone Encounter (Signed)
I called Beth, patient's daughter, and advised expedited appeal received by [facility] and to allow 72 hours from 01/20/2023 for decision. Holding for Autumn/Brittany in case you need message to check on benefits.

## 2023-01-21 NOTE — Telephone Encounter (Signed)
[employer] states this is an received exopodited appeal it is being reviewed and allow 72 hours from 01/20/23, if someone could please notify patient.Judson Roch [phone], sending message to Abigail Butts

## 2023-01-24 ENCOUNTER — Telehealth: Payer: Self-pay

## 2023-01-24 NOTE — Telephone Encounter (Signed)
Called patient to schedule Medicare Annual Wellness Visit (AWV). Unable to reach patient.  Last date of AWV: 08/04/21  Please schedule an appointment at any time with [facility].   Norton Blizzard, [place] (AAMA)  [place] Program [phone]

## 2023-01-25 ENCOUNTER — Encounter: Payer: Self-pay | Admitting: Orthopedic Surgery

## 2023-01-25 ENCOUNTER — Ambulatory Visit (INDEPENDENT_AMBULATORY_CARE_PROVIDER_SITE_OTHER): Payer: Medicare HMO | Admitting: Orthopedic Surgery

## 2023-01-25 DIAGNOSIS — L97221 Non-pressure chronic ulcer of left calf limited to breakdown of skin: Secondary | ICD-10-CM | POA: Diagnosis not present

## 2023-01-25 DIAGNOSIS — I83022 Varicose veins of left lower extremity with ulcer of calf: Secondary | ICD-10-CM

## 2023-01-25 NOTE — Telephone Encounter (Signed)
Received a fax from [facility] today to advise that the graft is covered. The appeal was approved and I have unloaded this fax to the [facility] portal as well as emailed a copy to [place] the in office kerecis rep.

## 2023-01-25 NOTE — Progress Notes (Signed)
Office Visit Note   Patient: Valerie Rollins           Date of Birth: Feb 21, 1936           MRN: [id number] Visit Date: 01/11/2023              Requested by: Binnie Rail, MD [place],  [place] [address] PCP: Binnie Rail, MD  Chief Complaint  Patient presents with   Left Ankle - Wound Check    Registry pt #9 visit #3      HPI: Patient is an 87 year old woman who is seen in follow-up for venous ulceration.  She is Registry patient #9/3 visit.  In office application of Kerecis February 6.  She has been using silver cell and Profore compression wrap.  Assessment & Plan: Visit Diagnoses:  1. Venous stasis ulcer of left calf limited to breakdown of skin with varicose veins (HCC)     Plan: Patient was placed in knee-high compression sock.  Recommended protein supplement such as boost and omega-3 supplement.  Follow-Up Instructions: Return in about 1 week (around 01/18/2023).   Ortho Exam  Patient is alert, oriented, no adenopathy, well-dressed, normal affect, normal respiratory effort. Examination patient has good wrinkling of the skin.  The ulcer is smaller with healthy granulation tissue.  The ulcer is 15 mm in diameter and 1 mm deep.  Her compression sock was applied.  Imaging: No results found.   Labs: Lab Results  Component Value Date   HGBA1C 6.0 08/16/2022   HGBA1C 5.9 02/01/2022   HGBA1C 5.8 08/03/2021   LABURIC 5.6 09/02/2010   REPTSTATUS 11/16/2011 FINAL 11/15/2011   CULT NO GROWTH 11/15/2011   LABORGA STREPTOCOCCUS GROUP D;high probability for S.bovis 02/15/2013     Lab Results  Component Value Date   ALBUMIN 4.7 08/16/2022   ALBUMIN 4.7 02/01/2022   ALBUMIN 4.5 08/03/2021    Lab Results  Component Value Date   MG 2.0 04/02/2015   MG 2.2 04/02/2015   MG 2.8 (H) 04/01/2015   Lab Results  Component Value Date   VD25OH 41.01 08/16/2022   VD25OH 36.69 02/01/2022   VD25OH 43.05 08/03/2021    No results found for:  "PREALBUMIN"    Latest Ref Rng & Units 08/16/2022    1:56 PM 02/01/2022    1:45 PM 08/03/2021    1:59 PM  CBC EXTENDED  WBC 4.0 - 10.5 K/uL 5.7  6.1  5.8   RBC 3.87 - 5.11 Mil/uL 4.84  4.81  4.89   Hemoglobin 12.0 - 15.0 g/dL 14.7  14.6  14.6   HCT 36.0 - 46.0 % 43.1  42.5  43.3   Platelets 150.0 - 400.0 K/uL 193.0  199.0  197.0   NEUT# 1.4 - 7.7 K/uL 3.8  3.8  3.9   Lymph# 0.7 - 4.0 K/uL 1.3  1.6  1.3      There is no height or weight on file to calculate BMI.  Orders:  No orders of the defined types were placed in this encounter.  No orders of the defined types were placed in this encounter.    Procedures: No procedures performed  Clinical Data: No additional findings.  ROS:  All other systems negative, except as noted in the HPI. Review of Systems  Objective: Vital Signs: There were no vitals taken for this visit.  Specialty Comments:  No specialty comments available.  PMFS History: Patient Active Problem List   Diagnosis Date Noted  Infected ulcer of skin ([place]) 12/01/2022   Fall 12/01/2022   Ecchymosis 12/01/2022   Thickened nails 02/02/2021   Long-term current use of opiate analgesic 10/11/2020   Degenerative spondylolisthesis 10/11/2020   Chronic venous stasis dermatitis of both lower extremities 01/21/2020   On long term drug therapy 11/05/2019   Hypercalcemia 07/19/2019   CKD (chronic kidney disease) stage 3, GFR 30-59 ml/min 07/19/2019   Chronic low back pain 05/07/2019   Candidal intertrigo 04/20/2018   Angular stomatitis 04/20/2018   Seasonal allergic rhinitis due to pollen 03/21/2018   Bilateral leg edema 12/31/2016   Prediabetes 01/10/2016   Osteoporosis - prolia Q 6 months 12/26/2015   Mass of neck 07/07/2015   Long-term (current) use of anticoagulants 04/16/2015   S/P CABG x 1 04/01/2015   CAD (coronary artery disease) 03/28/2015   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 s/p LIMA-LAD   Diverticulitis 2002   based on CT scan   Diverticulosis    Dr Henrene Pastor   DVT (deep venous thrombosis) ([place]) 2000   no trigger   Endometriosis    Gastritis 2006   @ Endo   GERD (gastroesophageal reflux disease)    Hiatal hernia    Hyperlipidemia    Hypertension    IBS (irritable bowel syndrome)    Ocular hypertension    glaucoma suspect, Dr. Kathrin Penner   Skin cancer    Superficial phlebitis     X 2, 1999, 2000   Syncope 11/12/11   in context CAP . Carotid Doppler exam was negative. 2D echocardiogram revealed mild dilation of the left atrium and moderate increase in the systolic pressureof  the pulmonary artery   UTI (lower urinary tract infection) 01/2013   Strep bovis ; PCN sensitive    Family History  Problem Relation Age of Onset   Alzheimer's disease Mother 79       TIAs   Heart attack Father 51   Hyperlipidemia Brother    Hypertension Brother    Hypertension Brother    Prostate cancer Brother 53   Colon cancer Maternal Grandmother    Heart attack Paternal Grandmother 82   Diverticulitis Daughter        S/P colectomy   Diabetes Neg Hx    Stroke Neg Hx    Hypercalcemia Neg Hx    Stomach cancer Neg Hx    Pancreatic cancer Neg  Hx    Esophageal cancer Neg Hx     Past Surgical History:  Procedure Laterality Date   CARDIAC CATHETERIZATION N/A 03/28/2015   Procedure: Left Heart Cath and Coronary Angiography;  Surgeon: Jolaine Artist, MD;  Location: [facility] INVASIVE CV LAB CUP[facility];  Service: Cardiovascular;  Laterality:  N/A;   CATARACT EXTRACTION Bilateral    CATARACT EXTRACTION W/ INTRAOCULAR LENS IMPLANT     OS; Dr Kathrin Penner   COLONOSCOPY  1995, 2002, 2012   diverticulosis; Dr Henrene Pastor   CORONARY ARTERY BYPASS GRAFT N/A 04/01/2015   Procedure: CORONARY ARTERY BYPASS GRAFTING (CABG), ON PUMP, TIMES ONE, USING LEFT INTERNAL MAMMARY ARTERY;  Surgeon: Ivin Poot, MD;  Location: [facility];  Service: Open Heart Surgery;  Laterality: N/A;   EXCISION OF ATRIAL MYXOMA N/A 04/01/2015   Procedure: EXCISION OF ATRIAL MYXOMA;  Surgeon: Ivin Poot, MD;  Location: [facility] [place] [place];  Service: Open Heart Surgery;  Laterality: N/A;   ORIF ANKLE FRACTURE  11/14/2011   Procedure: OPEN REDUCTION INTERNAL FIXATION (ORIF) ANKLE FRACTURE;  Surgeon: Colin Rhein;  [facility];  Service: Orthopedics;  Laterality: Left;  open reduction internal fixation trimalleolar ankle fracture   TEE WITHOUT CARDIOVERSION N/A 04/01/2015   Procedure: TRANSESOPHAGEAL ECHOCARDIOGRAM (TEE);  Surgeon: Ivin Poot, MD;  Location: [facility];  Service: Open Heart Surgery;  Laterality: N/A;   TONSILLECTOMY     TOTAL ABDOMINAL HYSTERECTOMY W/ BILATERAL SALPINGOOPHORECTOMY  1980   dysfunctional menses, endometriosis   VARICOSE VEIN SURGERY     1960, 1965, 2009   Social History   Occupational History   Occupation: Retired    Fish farm manager: RETIRED  Tobacco Use   Smoking status: Never   Smokeless tobacco: Never  Vaping Use   Vaping Use: Never used  Substance and Sexual Activity   Alcohol use: No   Drug use: No   Sexual activity: Not on file

## 2023-01-28 ENCOUNTER — Encounter: Payer: Self-pay | Admitting: Internal Medicine

## 2023-02-01 ENCOUNTER — Encounter: Payer: Self-pay | Admitting: Orthopedic Surgery

## 2023-02-01 ENCOUNTER — Ambulatory Visit (INDEPENDENT_AMBULATORY_CARE_PROVIDER_SITE_OTHER): Payer: Medicare HMO | Admitting: Orthopedic Surgery

## 2023-02-01 DIAGNOSIS — L97221 Non-pressure chronic ulcer of left calf limited to breakdown of skin: Secondary | ICD-10-CM

## 2023-02-01 DIAGNOSIS — I83022 Varicose veins of left lower extremity with ulcer of calf: Secondary | ICD-10-CM

## 2023-02-01 NOTE — Progress Notes (Signed)
Office Visit Note   Patient: Valerie Rollins           Date of Birth: Feb 23, 1936           MRN: [id number] Visit Date: 01/18/2023              Requested by: Binnie Rail, MD [place],  [place] [address] PCP: Binnie Rail, MD  Chief Complaint  Patient presents with   Left Leg - Follow-up    [facility] registry pt #9 visit #4      HPI: Patient is an 87 year old woman who is seen in follow-up for venous ulceration left leg.  Patient is currently wearing the Vive compression sock.  She is Registry patient #9 visit #4.  Assessment & Plan: Visit Diagnoses:  1. Venous stasis ulcer of left calf limited to breakdown of skin with varicose veins (HCC)     Plan: Continue with the compression sock.  Follow-Up Instructions: Return in about 1 week (around 01/25/2023).   Ortho Exam  Patient is alert, oriented, no adenopathy, well-dressed, normal affect, normal respiratory effort. Examination patient has edema in the left lower extremity.  There is dry callused skin.  After debridement the wound measures 7 x 15 mm and is 1 mm deep with healthy granulation tissue.  Imaging: No results found.   Labs: Lab Results  Component Value Date   HGBA1C 6.0 08/16/2022   HGBA1C 5.9 02/01/2022   HGBA1C 5.8 08/03/2021   LABURIC 5.6 09/02/2010   REPTSTATUS 11/16/2011 FINAL 11/15/2011   CULT NO GROWTH 11/15/2011   LABORGA STREPTOCOCCUS GROUP D;high probability for S.bovis 02/15/2013     Lab Results  Component Value Date   ALBUMIN 4.7 08/16/2022   ALBUMIN 4.7 02/01/2022   ALBUMIN 4.5 08/03/2021    Lab Results  Component Value Date   MG 2.0 04/02/2015   MG 2.2 04/02/2015   MG 2.8 (H) 04/01/2015   Lab Results  Component Value Date   VD25OH 41.01 08/16/2022   VD25OH 36.69 02/01/2022   VD25OH 43.05 08/03/2021    No results found for: "PREALBUMIN"    Latest Ref Rng & Units 08/16/2022    1:56 PM 02/01/2022    1:45 PM 08/03/2021    1:59 PM  CBC EXTENDED  WBC 4.0 - 10.5  K/uL 5.7  6.1  5.8   RBC 3.87 - 5.11 Mil/uL 4.84  4.81  4.89   Hemoglobin 12.0 - 15.0 g/dL 14.7  14.6  14.6   HCT 36.0 - 46.0 % 43.1  42.5  43.3   Platelets 150.0 - 400.0 K/uL 193.0  199.0  197.0   NEUT# 1.4 - 7.7 K/uL 3.8  3.8  3.9   Lymph# 0.7 - 4.0 K/uL 1.3  1.6  1.3      There is no height or weight on file to calculate BMI.  Orders:  No orders of the defined types were placed in this encounter.  No orders of the defined types were placed in this encounter.    Procedures: No procedures performed  Clinical Data: No additional findings.  ROS:  All other systems negative, except as noted in the HPI. Review of Systems  Objective: Vital Signs: There were no vitals taken for this visit.  Specialty Comments:  No specialty comments available.  PMFS History: Patient Active Problem List   Diagnosis Date Noted   Infected ulcer of skin ([place]) 12/01/2022   Fall 12/01/2022   Ecchymosis 12/01/2022   Thickened nails 02/02/2021  Long-term current use of opiate analgesic 10/11/2020   Degenerative spondylolisthesis 10/11/2020   Chronic venous stasis dermatitis of both lower extremities 01/21/2020   On long term drug therapy 11/05/2019   Hypercalcemia 07/19/2019   CKD (chronic kidney disease) stage 3, GFR 30-59 ml/min 07/19/2019   Chronic low back pain 05/07/2019   Candidal intertrigo 04/20/2018   Angular stomatitis 04/20/2018   Seasonal allergic rhinitis due to pollen 03/21/2018   Bilateral leg edema 12/31/2016   Prediabetes 01/10/2016   Osteoporosis - prolia Q 6 months 12/26/2015   Mass of neck 07/07/2015   Long-term (current) use of anticoagulants 04/16/2015   S/P CABG x 1 04/01/2015   CAD (coronary artery disease) 03/28/2015   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 Crenshaw 11/15/2011   Syncope 11/12/2011   RBBB (right bundle branch block) 11/12/2011   SKIN CANCER, HX  OF 11/18/2009   Diverticulosis of large intestine 10/31/2008   VARICOSE VEINS, LOWER EXTREMITIES 03/13/2008   GERD 02/20/2008   IBS 02/20/2008   Osteoarthritis 02/20/2008   Hyperlipidemia 12/01/2006   Essential hypertension 12/01/2006   SUPERFICIAL THROMBOPHLEBITIS 12/01/2006   DVT, HX OF 12/01/2006   Past Medical History:  Diagnosis Date   Ankle fracture, left 11/12/2011   Atrial fibrillation ([place])    Post op after repair of ankle fracture   Atrial myxoma 03/2015   Resected at the time of CABG with LAA ligation   CAD (coronary artery disease) 03/2015   LAD 80%, otherwise minimal CAD. s/p LIMA-LAD   Diverticulitis 2002   based on CT scan   Diverticulosis    Dr Henrene Pastor   DVT (deep venous thrombosis) ([place]) 2000   no trigger   Endometriosis    Gastritis 2006   @ Endo   GERD (gastroesophageal reflux disease)    Hiatal hernia    Hyperlipidemia    Hypertension    IBS (irritable bowel syndrome)    Ocular hypertension    glaucoma suspect, Dr. Kathrin Penner   Skin cancer    Superficial phlebitis     X 2, 1999, 2000   Syncope 11/12/11   in context CAP . Carotid Doppler exam was negative. 2D echocardiogram revealed mild dilation of the left atrium and moderate increase in the systolic pressureof  the pulmonary artery   UTI (lower urinary tract infection) 01/2013   Strep bovis ; PCN sensitive    Family History  Problem Relation Age of Onset   Alzheimer's disease Mother 66       TIAs   Heart attack Father 62   Hyperlipidemia Brother    Hypertension Brother    Hypertension Brother    Prostate cancer Brother 6   Colon cancer Maternal Grandmother    Heart attack Paternal Grandmother 82   Diverticulitis Daughter        S/P colectomy   Diabetes Neg Hx    Stroke Neg Hx    Hypercalcemia Neg Hx    Stomach cancer Neg Hx    Pancreatic cancer Neg Hx    Esophageal cancer Neg Hx     Past Surgical History:  Procedure Laterality Date   CARDIAC CATHETERIZATION N/A 03/28/2015    Procedure: Left Heart Cath and Coronary Angiography;  Surgeon: Jolaine Artist, MD;  Location: [facility];  Service: Cardiovascular;  Laterality: N/A;   CATARACT EXTRACTION Bilateral    CATARACT EXTRACTION W/ INTRAOCULAR LENS IMPLANT     OS; Dr  [place], 2002, 2012   diverticulosis; Dr Henrene Pastor   CORONARY ARTERY BYPASS GRAFT N/A 04/01/2015   Procedure: CORONARY ARTERY BYPASS GRAFTING (CABG), ON PUMP, TIMES ONE, USING LEFT INTERNAL MAMMARY ARTERY;  Surgeon: Ivin Poot, MD;  Location: [facility];  Service: Open Heart Surgery;  Laterality: N/A;   EXCISION OF ATRIAL MYXOMA N/A 04/01/2015   Procedure: EXCISION OF ATRIAL MYXOMA;  Surgeon: Ivin Poot, MD;  Location: [facility] [place];  Service: Open Heart Surgery;  Laterality: N/A;   ORIF ANKLE FRACTURE  11/14/2011   Procedure: OPEN REDUCTION INTERNAL FIXATION (ORIF) ANKLE FRACTURE;  Surgeon: Colin Rhein;  [facility];  Service: Orthopedics;  Laterality: Left;  open reduction internal fixation trimalleolar ankle fracture   TEE WITHOUT CARDIOVERSION N/A 04/01/2015   Procedure: TRANSESOPHAGEAL ECHOCARDIOGRAM (TEE);  Surgeon: Ivin Poot, MD;  Location: [facility];  Service: Open Heart Surgery;  Laterality: N/A;   TONSILLECTOMY     TOTAL ABDOMINAL HYSTERECTOMY W/ BILATERAL SALPINGOOPHORECTOMY  1980   dysfunctional menses, endometriosis   VARICOSE VEIN SURGERY     1960, 1965, 2009   Social History   Occupational History   Occupation: Retired    Fish farm manager: RETIRED  Tobacco Use   Smoking status: Never   Smokeless tobacco: Never  Vaping Use   Vaping Use: Never used  Substance and Sexual Activity   Alcohol use: No   Drug use: No   Sexual activity: Not on file

## 2023-02-04 ENCOUNTER — Other Ambulatory Visit (HOSPITAL_COMMUNITY): Payer: Self-pay

## 2023-02-04 ENCOUNTER — Telehealth: Payer: Self-pay

## 2023-02-04 NOTE — Telephone Encounter (Signed)
Prolia VOB initiated via [email or web address] 

## 2023-02-04 NOTE — Telephone Encounter (Signed)
Pt ready for scheduling on or after 02/04/23   Out-of-pocket cost due at time of visit: $327   Primary: [employer] Adv Prolia co-insurance: 20% (approximately $302) Admin fee co-insurance: 20% (approximately $25)   Secondary: n/a Prolia co-insurance:  Admin fee co-insurance:    Deductible: does not apply   Prior Auth: APPROVED PA# [id number] Valid: 02/11/22-02/11/23   ** This summary of benefits is an estimation of the patient's out-of-pocket cost. Exact cost may vary based on individual plan coverage.

## 2023-02-04 NOTE — Telephone Encounter (Signed)
Pt ready for scheduling for Prolia on or after : 02/04/23  Out-of-pocket cost due at time of visit: $327  Primary: [employer] Prolia co-insurance: 20% Admin fee co-insurance: 20%  Secondary: --- Prolia co-insurance:  Admin fee co-insurance:   Medical Benefit Details: Date Benefits were checked: 02/04/23 Deductible: NO/ Coinsurance: 20%/ Admin Fee: 20%  Prior Auth: APPROVED PA# [id number]  Expiration Date: 02/11/22-02/11/23   Pharmacy benefit: Copay $100 If patient wants fill through the pharmacy benefit please send prescription to: [employer], and include estimated need by date in rx notes. Pharmacy will ship medication directly to the office.  Patient NOT eligible for Prolia Copay Card. Copay Card can make patient's cost as little as $25. Link to apply: [email or web address]://[email or web address]  ** This summary of benefits is an estimation of the patient's out-of-pocket cost. Exact cost may very based on individual plan coverage.

## 2023-02-07 ENCOUNTER — Ambulatory Visit: Payer: Medicare HMO | Admitting: Orthopedic Surgery

## 2023-02-07 NOTE — Telephone Encounter (Signed)
Patient Advocate Encounter  Prior Authorization for Prolia 60mg  has been approved.    PA# [id number] Effective dates: 02/04/23 through 02/04/24

## 2023-02-08 ENCOUNTER — Encounter: Payer: Self-pay | Admitting: Orthopedic Surgery

## 2023-02-08 ENCOUNTER — Ambulatory Visit (INDEPENDENT_AMBULATORY_CARE_PROVIDER_SITE_OTHER): Payer: Medicare HMO | Admitting: Orthopedic Surgery

## 2023-02-08 DIAGNOSIS — I83022 Varicose veins of left lower extremity with ulcer of calf: Secondary | ICD-10-CM

## 2023-02-08 DIAGNOSIS — L97221 Non-pressure chronic ulcer of left calf limited to breakdown of skin: Secondary | ICD-10-CM | POA: Diagnosis not present

## 2023-02-08 NOTE — Telephone Encounter (Signed)
Patient notified via my-chart of approval. 

## 2023-02-08 NOTE — Progress Notes (Signed)
Office Visit Note   Patient: Valerie Rollins           Date of Birth: 03-Aug-1936           MRN: [id number] Visit Date: 02/08/2023              Requested by: Binnie Rail, MD [place],  [place] [address] PCP: Binnie Rail, MD  Chief Complaint  Patient presents with   Left Leg - Follow-up    Registry pt #9 visit #7      HPI: Patient is an 87 year old woman who presents in follow-up status post Kerecis tissue graft for venous ulceration left leg.  Patient is Registry patient #9 visit #7.  Assessment & Plan: Visit Diagnoses:  1. Venous stasis ulcer of left calf limited to breakdown of skin with varicose veins (HCC)     Plan: Patient will continue with her knee-high compression sock continue with exercise.  Follow-Up Instructions: Return if symptoms worsen or fail to improve.   Ortho Exam  Patient is alert, oriented, no adenopathy, well-dressed, normal affect, normal respiratory effort. Examination the scab was removed there is complete epithelialization of the wound.  Imaging: No results found.    Labs: Lab Results  Component Value Date   HGBA1C 6.0 08/16/2022   HGBA1C 5.9 02/01/2022   HGBA1C 5.8 08/03/2021   LABURIC 5.6 09/02/2010   REPTSTATUS 11/16/2011 FINAL 11/15/2011   CULT NO GROWTH 11/15/2011   LABORGA STREPTOCOCCUS GROUP D;high probability for S.bovis 02/15/2013     Lab Results  Component Value Date   ALBUMIN 4.7 08/16/2022   ALBUMIN 4.7 02/01/2022   ALBUMIN 4.5 08/03/2021    Lab Results  Component Value Date   MG 2.0 04/02/2015   MG 2.2 04/02/2015   MG 2.8 (H) 04/01/2015   Lab Results  Component Value Date   VD25OH 41.01 08/16/2022   VD25OH 36.69 02/01/2022   VD25OH 43.05 08/03/2021    No results found for: "PREALBUMIN"    Latest Ref Rng & Units 08/16/2022    1:56 PM 02/01/2022    1:45 PM 08/03/2021    1:59 PM  CBC EXTENDED  WBC 4.0 - 10.5 K/uL 5.7  6.1  5.8   RBC 3.87 - 5.11 Mil/uL 4.84  4.81  4.89   Hemoglobin  12.0 - 15.0 g/dL 14.7  14.6  14.6   HCT 36.0 - 46.0 % 43.1  42.5  43.3   Platelets 150.0 - 400.0 K/uL 193.0  199.0  197.0   NEUT# 1.4 - 7.7 K/uL 3.8  3.8  3.9   Lymph# 0.7 - 4.0 K/uL 1.3  1.6  1.3      There is no height or weight on file to calculate BMI.  Orders:  No orders of the defined types were placed in this encounter.  No orders of the defined types were placed in this encounter.    Procedures: No procedures performed  Clinical Data: No additional findings.  ROS:  All other systems negative, except as noted in the HPI. Review of Systems  Objective: Vital Signs: There were no vitals taken for this visit.  Specialty Comments:  No specialty comments available.  PMFS History: Patient Active Problem List   Diagnosis Date Noted   Infected ulcer of skin ([place]) 12/01/2022   Fall 12/01/2022   Ecchymosis 12/01/2022   Thickened nails 02/02/2021   Long-term current use of opiate analgesic 10/11/2020   Degenerative spondylolisthesis 10/11/2020   Chronic venous stasis dermatitis of  both lower extremities 01/21/2020   On long term drug therapy 11/05/2019   Hypercalcemia 07/19/2019   CKD (chronic kidney disease) stage 3, GFR 30-59 ml/min 07/19/2019   Chronic low back pain 05/07/2019   Candidal intertrigo 04/20/2018   Angular stomatitis 04/20/2018   Seasonal allergic rhinitis due to pollen 03/21/2018   Bilateral leg edema 12/31/2016   Prediabetes 01/10/2016   Osteoporosis - prolia Q 6 months 12/26/2015   Mass of neck 07/07/2015   Long-term (current) use of anticoagulants 04/16/2015   S/P CABG x 1 04/01/2015   CAD (coronary artery disease) 03/28/2015   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 s/p LIMA-LAD   Diverticulitis 2002   based on CT scan   Diverticulosis    Dr Henrene Pastor   DVT (deep venous thrombosis) ([place]) 2000   no trigger   Endometriosis    Gastritis 2006   @ Endo   GERD (gastroesophageal reflux disease)    Hiatal hernia    Hyperlipidemia    Hypertension    IBS (irritable bowel syndrome)    Ocular hypertension    glaucoma suspect, Dr. Kathrin Penner   Skin cancer    Superficial phlebitis     X 2, 1999, 2000   Syncope 11/12/11   in context CAP . Carotid Doppler exam was negative. 2D echocardiogram revealed mild dilation of the left atrium and moderate increase in the systolic pressureof  the pulmonary artery   UTI (lower urinary tract infection) 01/2013   Strep bovis ; PCN sensitive    Family History  Problem Relation Age of Onset   Alzheimer's disease Mother 46       TIAs   Heart attack Father 54   Hyperlipidemia Brother    Hypertension Brother    Hypertension Brother    Prostate cancer Brother 65   Colon cancer Maternal Grandmother    Heart attack Paternal Grandmother 82   Diverticulitis Daughter        S/P colectomy   Diabetes Neg Hx    Stroke Neg Hx    Hypercalcemia Neg Hx    Stomach cancer Neg Hx    Pancreatic cancer Neg Hx    Esophageal cancer Neg Hx     Past Surgical History:  Procedure Laterality Date   CARDIAC CATHETERIZATION N/A 03/28/2015   Procedure: Left Heart Cath and Coronary Angiography;  Surgeon: Jolaine Artist, MD;  Location: [facility];  Service: Cardiovascular;  Laterality: N/A;   CATARACT EXTRACTION Bilateral    CATARACT EXTRACTION W/ INTRAOCULAR LENS IMPLANT     OS; Dr Kathrin Penner   COLONOSCOPY  1995, 2002, 2012   diverticulosis; Dr Henrene Pastor   CORONARY ARTERY BYPASS GRAFT  N/A 04/01/2015   Procedure: CORONARY ARTERY BYPASS GRAFTING (CABG), ON PUMP, TIMES ONE, USING LEFT INTERNAL MAMMARY ARTERY;  Surgeon: Ivin Poot, MD;  Location: [facility];  Service: Open Heart Surgery;  Laterality: N/A;   EXCISION OF ATRIAL MYXOMA N/A 04/01/2015   Procedure: EXCISION OF ATRIAL MYXOMA;  Surgeon: Ivin Poot, MD;  Location: [facility];  Service: Open Heart Surgery;  Laterality: N/A;   ORIF ANKLE FRACTURE  11/14/2011   Procedure: OPEN REDUCTION INTERNAL FIXATION (ORIF) ANKLE FRACTURE;  Surgeon: Colin Rhein;  [facility];  Service: Orthopedics;  Laterality: Left;  open reduction internal fixation trimalleolar ankle fracture   TEE WITHOUT CARDIOVERSION N/A 04/01/2015   Procedure: TRANSESOPHAGEAL ECHOCARDIOGRAM (TEE);  Surgeon: Ivin Poot, MD;  Location: [facility];  Service: Open Heart Surgery;  Laterality: N/A;   TONSILLECTOMY     TOTAL ABDOMINAL HYSTERECTOMY W/ BILATERAL SALPINGOOPHORECTOMY  1980   dysfunctional menses, endometriosis   VARICOSE VEIN SURGERY     1960, 1965, 2009   Social History   Occupational History   Occupation: Retired    Fish farm manager: RETIRED  Tobacco Use   Smoking status: Never   Smokeless tobacco: Never  Vaping Use   Vaping Use: Never used  Substance and Sexual Activity   Alcohol use: No   Drug use: No   Sexual activity: Not on file

## 2023-02-09 ENCOUNTER — Telehealth: Payer: Self-pay

## 2023-02-09 NOTE — Telephone Encounter (Signed)
I called and advised that if the pt wanted to remove the socks at night and apply first thing in the morning this would be ok. Will call with any other questions.

## 2023-02-09 NOTE — Telephone Encounter (Signed)
Patient's daughter Eustaquio Maize would like to know if patient needs to continue to wear the socks around the clock or take them off at night.  CB# [phone].  Please advise.  Thank you.

## 2023-02-13 ENCOUNTER — Other Ambulatory Visit: Payer: Self-pay | Admitting: Internal Medicine

## 2023-02-13 ENCOUNTER — Encounter: Payer: Self-pay | Admitting: Internal Medicine

## 2023-02-13 NOTE — Progress Notes (Unsigned)
Subjective:    Patient ID: Valerie Rollins, female    DOB: 23-Jun-1936, 87 y.o.   MRN: [id number]     HPI Valerie Rollins is here for follow up of her chronic medical problems.  Venous stasis ulcer - healing - following with Dr Sharol Given.   Overall doing well-no concerns  Medications and allergies reviewed with patient and updated if appropriate.  Current Outpatient Medications on File Prior to Visit  Medication Sig Dispense Refill   albuterol (VENTOLIN HFA) 108 (90 Base) MCG/ACT inhaler INHALE 2 PUFFS BY MOUTH EVERY 6 HOURS AS NEEDED FOR WHEEZE OR SHORTNESS OF BREATH 6.7 each 5   amoxicillin (AMOXIL) 500 MG capsule SMARTSIG:4 Capsule(s) By Mouth     Ascorbic Acid (VITAMIN C) 100 MG tablet Take 100 mg by mouth daily.     augmented betamethasone dipropionate (DIPROLENE-AF) 0.05 % cream Apply topically as directed.     calcium citrate (CALCITRATE - DOSED IN MG ELEMENTAL CALCIUM) 950 (200 Ca) MG tablet Take 200 mg of elemental calcium by mouth 2 (two) times daily.     Cholecalciferol (VITAMIN D3) 1000 UNITS CAPS Take 1,000 Units by mouth daily.      Coenzyme Q10 (CO Q 10 PO) Take by mouth daily.     COMIRNATY syringe      denosumab (PROLIA) 60 MG/ML SOSY injection Inject 60 mg into the skin every 6 (six) months.     dicyclomine (BENTYL) 20 MG tablet TAKE 1 TABLET (20 MG TOTAL) BY MOUTH EVERY 6 (SIX) HOURS AS NEEDED FOR PAIN 360 tablet 1   ELIQUIS 5 MG TABS tablet TAKE 1 TABLET BY MOUTH TWICE A DAY 60 tablet 5   fexofenadine (ALLEGRA) 180 MG tablet Take 1 tablet (180 mg total) by mouth daily. 30 tablet 11   furosemide (LASIX) 40 MG tablet TAKE 1 TABLET BY MOUTH DAILY. TAKE AN EXTRA 1/2 TABLET DAILY AS NEEDED 135 tablet 1   hydroxypropyl methylcellulose / hypromellose (ISOPTO TEARS / GONIOVISC) 2.5 % ophthalmic solution 1 drop.     Lactobacillus (PROBIOTIC ACIDOPHILUS PO) Take 1 capsule by mouth daily.     losartan (COZAAR) 50 MG tablet TAKE 1 TABLET BY MOUTH EVERY DAY 90 tablet 3   omeprazole  (PRILOSEC) 20 MG capsule TAKE 1 CAPSULE BY MOUTH EVERY DAY 90 capsule 3   rosuvastatin (CRESTOR) 10 MG tablet TAKE 1 TABLET BY MOUTH EVERY DAY 90 tablet 3   traMADol (ULTRAM) 50 MG tablet Take 1 tablet (50 mg total) by mouth every 6 (six) hours as needed. Dr.Ramos (Patient taking differently: Take 50 mg by mouth every 6 (six) hours as needed. Dr.Ramos; pt takes 1 tablet every night, maybe 1 during the day as needed) 30 tablet 0   No current facility-administered medications on file prior to visit.     Review of Systems  Constitutional:  Negative for fever.  Respiratory:  Positive for wheezing (occ). Negative for cough and shortness of breath.   Cardiovascular:  Positive for leg swelling. Negative for chest pain and palpitations.  Neurological:  Positive for headaches (occ, mlld). Negative for light-headedness.       Objective:   Vitals:   02/14/23 1313  BP: 132/78  Pulse: 70  Temp: 98 F (36.7 C)  SpO2: 97%   BP Readings from Last 3 Encounters:  02/14/23 132/78  12/01/22 136/82  11/15/22 130/81   Wt Readings from Last 3 Encounters:  02/14/23 141 lb (64 kg)  12/01/22 144 lb (65.3 kg)  11/15/22  138 lb 14.2 oz (63 kg)   Body mass index is 24.2 kg/m.    Physical Exam Constitutional:      General: She is not in acute distress.    Appearance: Normal appearance.  HENT:     Head: Normocephalic and atraumatic.  Eyes:     Conjunctiva/sclera: Conjunctivae normal.  Cardiovascular:     Rate and Rhythm: Normal rate. Rhythm irregular.     Heart sounds: Murmur (2/6 systolic) heard.  Pulmonary:     Effort: Pulmonary effort is normal. No respiratory distress.     Breath sounds: Normal breath sounds. No wheezing.  Musculoskeletal:     Cervical back: Neck supple.     Right lower leg: Edema (Wearing compression socks) present.     Left lower leg: Edema (Wearing compression socks) present.  Lymphadenopathy:     Cervical: No cervical adenopathy.  Skin:    General: Skin is warm  and dry.     Findings: No rash.  Neurological:     Mental Status: She is alert. Mental status is at baseline.  Psychiatric:        Mood and Affect: Mood normal.        Behavior: Behavior normal.        Lab Results  Component Value Date   WBC 5.7 08/16/2022   HGB 14.7 08/16/2022   HCT 43.1 08/16/2022   PLT 193.0 08/16/2022   GLUCOSE 95 08/16/2022   CHOL 156 08/16/2022   TRIG 49.0 08/16/2022   HDL 93.50 08/16/2022   LDLDIRECT 152.6 07/31/2013   LDLCALC 52 08/16/2022   ALT 25 08/16/2022   AST 29 08/16/2022   NA 143 08/16/2022   K 4.0 08/16/2022   CL 101 08/16/2022   CREATININE 0.88 08/16/2022   BUN 25 (H) 08/16/2022   CO2 32 08/16/2022   TSH 3.35 08/03/2021   INR 1.4 07/31/2015   HGBA1C 6.0 08/16/2022     Assessment & Plan:    See Problem List for Assessment and Plan of chronic medical problems.

## 2023-02-13 NOTE — Patient Instructions (Addendum)
      Blood work was ordered.   The lab is on the first floor.    Medications changes include :   none     Return in about 6 months (around 08/17/2023) for Physical Exam.

## 2023-02-14 ENCOUNTER — Ambulatory Visit (INDEPENDENT_AMBULATORY_CARE_PROVIDER_SITE_OTHER): Payer: Medicare HMO | Admitting: Internal Medicine

## 2023-02-14 ENCOUNTER — Encounter: Payer: Self-pay | Admitting: Orthopedic Surgery

## 2023-02-14 ENCOUNTER — Encounter: Payer: Self-pay | Admitting: Internal Medicine

## 2023-02-14 VITALS — BP 132/78 | HR 70 | Temp 98.0°F | Ht 64.0 in | Wt 141.0 lb

## 2023-02-14 DIAGNOSIS — M810 Age-related osteoporosis without current pathological fracture: Secondary | ICD-10-CM | POA: Diagnosis not present

## 2023-02-14 DIAGNOSIS — R600 Localized edema: Secondary | ICD-10-CM | POA: Diagnosis not present

## 2023-02-14 DIAGNOSIS — R7303 Prediabetes: Secondary | ICD-10-CM

## 2023-02-14 DIAGNOSIS — N1831 Chronic kidney disease, stage 3a: Secondary | ICD-10-CM

## 2023-02-14 DIAGNOSIS — I10 Essential (primary) hypertension: Secondary | ICD-10-CM

## 2023-02-14 DIAGNOSIS — E782 Mixed hyperlipidemia: Secondary | ICD-10-CM

## 2023-02-14 DIAGNOSIS — I079 Rheumatic tricuspid valve disease, unspecified: Secondary | ICD-10-CM

## 2023-02-14 DIAGNOSIS — Z0000 Encounter for general adult medical examination without abnormal findings: Secondary | ICD-10-CM

## 2023-02-14 DIAGNOSIS — I4891 Unspecified atrial fibrillation: Secondary | ICD-10-CM

## 2023-02-14 LAB — CBC WITH DIFFERENTIAL/PLATELET
Basophils Absolute: 0 10*3/uL (ref 0.0–0.1)
Basophils Relative: 0.6 % (ref 0.0–3.0)
Eosinophils Absolute: 0.1 10*3/uL (ref 0.0–0.7)
Eosinophils Relative: 1 % (ref 0.0–5.0)
HCT: 43.4 % (ref 36.0–46.0)
Hemoglobin: 14.7 g/dL (ref 12.0–15.0)
Lymphocytes Relative: 20.4 % (ref 12.0–46.0)
Lymphs Abs: 1.2 10*3/uL (ref 0.7–4.0)
MCHC: 34 g/dL (ref 30.0–36.0)
MCV: 88.5 fl (ref 78.0–100.0)
Monocytes Absolute: 0.5 10*3/uL (ref 0.1–1.0)
Monocytes Relative: 7.5 % (ref 3.0–12.0)
Neutro Abs: 4.3 10*3/uL (ref 1.4–7.7)
Neutrophils Relative %: 70.5 % (ref 43.0–77.0)
Platelets: 203 10*3/uL (ref 150.0–400.0)
RBC: 4.9 Mil/uL (ref 3.87–5.11)
RDW: 12.9 % (ref 11.5–15.5)
WBC: 6.1 10*3/uL (ref 4.0–10.5)

## 2023-02-14 LAB — COMPREHENSIVE METABOLIC PANEL
ALT: 42 U/L — ABNORMAL HIGH (ref 0–35)
AST: 37 U/L (ref 0–37)
Albumin: 4.5 g/dL (ref 3.5–5.2)
Alkaline Phosphatase: 56 U/L (ref 39–117)
BUN: 36 mg/dL — ABNORMAL HIGH (ref 6–23)
CO2: 31 mEq/L (ref 19–32)
Calcium: 10.5 mg/dL (ref 8.4–10.5)
Chloride: 102 mEq/L (ref 96–112)
Creatinine, Ser: 0.91 mg/dL (ref 0.40–1.20)
GFR: 57.92 mL/min — ABNORMAL LOW (ref >60.00)
Glucose, Bld: 85 mg/dL (ref 70–99)
Potassium: 4.9 mEq/L (ref 3.5–5.1)
Sodium: 141 mEq/L (ref 135–145)
Total Bilirubin: 0.7 mg/dL (ref 0.2–1.2)
Total Protein: 7.3 g/dL (ref 6.0–8.3)

## 2023-02-14 LAB — LIPID PANEL
Cholesterol: 161 mg/dL (ref 0–200)
HDL: 90 mg/dL (ref >39.00)
LDL Cholesterol: 61 mg/dL (ref 0–99)
NonHDL: 70.76
Total CHOL/HDL Ratio: 2
Triglycerides: 47 mg/dL (ref 0.0–149.0)
VLDL: 9.4 mg/dL (ref 0.0–40.0)

## 2023-02-14 LAB — HEMOGLOBIN A1C: Hgb A1c MFr Bld: 5.9 % (ref 4.6–6.5)

## 2023-02-14 MED ORDER — METOPROLOL TARTRATE 25 MG PO TABS: 25.0000 mg | ORAL_TABLET | Freq: Two times a day (BID) | ORAL | 3 refills | Status: DC

## 2023-02-14 NOTE — Progress Notes (Signed)
Office Visit Note   Patient: Valerie Rollins           Date of Birth: 1936-11-12           MRN: [id number] Visit Date: 02/01/2023              Requested by: Binnie Rail, MD [place],  [place] [address] PCP: Binnie Rail, MD  Chief Complaint  Patient presents with   Left Ankle - Wound Check    registry pt # 9 visit #6      HPI: Patient is an 87 year old woman who presents in follow-up for venous insufficiency ulcer left ankle.  She is Registry patient #9 visit #6.  She is using compression stocking and protein supplement.  Assessment & Plan: Visit Diagnoses:  1. Venous stasis ulcer of left calf limited to breakdown of skin with varicose veins (HCC)     Plan: Follow-up in 1 week.  Continue with the Vive compression sock.  Follow-Up Instructions: Return in about 1 week (around 02/08/2023).   Ortho Exam  Patient is alert, oriented, no adenopathy, well-dressed, normal affect, normal respiratory effort. There is venous stasis swelling the ulcer has healed with a small scab but measures 5 x 10 mm.  There is no cellulitis or drainage.  Imaging: No results found.    Labs: Lab Results  Component Value Date   HGBA1C 6.0 08/16/2022   HGBA1C 5.9 02/01/2022   HGBA1C 5.8 08/03/2021   LABURIC 5.6 09/02/2010   REPTSTATUS 11/16/2011 FINAL 11/15/2011   CULT NO GROWTH 11/15/2011   LABORGA STREPTOCOCCUS GROUP D;high probability for S.bovis 02/15/2013     Lab Results  Component Value Date   ALBUMIN 4.7 08/16/2022   ALBUMIN 4.7 02/01/2022   ALBUMIN 4.5 08/03/2021    Lab Results  Component Value Date   MG 2.0 04/02/2015   MG 2.2 04/02/2015   MG 2.8 (H) 04/01/2015   Lab Results  Component Value Date   VD25OH 41.01 08/16/2022   VD25OH 36.69 02/01/2022   VD25OH 43.05 08/03/2021    No results found for: "PREALBUMIN"    Latest Ref Rng & Units 08/16/2022    1:56 PM 02/01/2022    1:45 PM 08/03/2021    1:59 PM  CBC EXTENDED  WBC 4.0 - 10.5 K/uL 5.7  6.1   5.8   RBC 3.87 - 5.11 Mil/uL 4.84  4.81  4.89   Hemoglobin 12.0 - 15.0 g/dL 14.7  14.6  14.6   HCT 36.0 - 46.0 % 43.1  42.5  43.3   Platelets 150.0 - 400.0 K/uL 193.0  199.0  197.0   NEUT# 1.4 - 7.7 K/uL 3.8  3.8  3.9   Lymph# 0.7 - 4.0 K/uL 1.3  1.6  1.3      There is no height or weight on file to calculate BMI.  Orders:  No orders of the defined types were placed in this encounter.  No orders of the defined types were placed in this encounter.    Procedures: No procedures performed  Clinical Data: No additional findings.  ROS:  All other systems negative, except as noted in the HPI. Review of Systems  Objective: Vital Signs: There were no vitals taken for this visit.  Specialty Comments:  No specialty comments available.  PMFS History: Patient Active Problem List   Diagnosis Date Noted   Infected ulcer of skin ([place]) 12/01/2022   Fall 12/01/2022   Ecchymosis 12/01/2022   Thickened nails 02/02/2021  Long-term current use of opiate analgesic 10/11/2020   Degenerative spondylolisthesis 10/11/2020   Chronic venous stasis dermatitis of both lower extremities 01/21/2020   Hypercalcemia 07/19/2019   CKD (chronic kidney disease) stage 3, GFR 30-59 ml/min 07/19/2019   Chronic low back pain 05/07/2019   Candidal intertrigo 04/20/2018   Angular stomatitis 04/20/2018   Seasonal allergic rhinitis due to pollen 03/21/2018   Bilateral leg edema 12/31/2016   Prediabetes 01/10/2016   Osteoporosis - prolia Q 6 months 12/26/2015   Mass of neck 07/07/2015   Long-term (current) use of anticoagulants 04/16/2015   S/P CABG x 1 04/01/2015   CAD (coronary artery disease) 03/28/2015   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 s/p LIMA-LAD   Diverticulitis 2002   based on CT scan   Diverticulosis    Dr Henrene Pastor   DVT (deep venous thrombosis) ([place]) 2000   no trigger   Endometriosis    Gastritis 2006   @ Endo   GERD (gastroesophageal reflux disease)    Hiatal hernia    Hyperlipidemia    Hypertension    IBS (irritable bowel syndrome)    Ocular hypertension    glaucoma suspect, Dr. Kathrin Penner   Skin cancer    Superficial phlebitis     X 2, 1999, 2000   Syncope 11/12/11   in context CAP . Carotid Doppler exam was negative. 2D echocardiogram revealed mild dilation of the left atrium and moderate increase in the systolic pressureof  the pulmonary artery   UTI (lower urinary tract infection) 01/2013   Strep bovis ; PCN sensitive    Family History  Problem Relation Age of Onset   Alzheimer's disease Mother 43       TIAs   Heart attack Father 80   Hyperlipidemia Brother    Hypertension Brother    Hypertension Brother    Prostate cancer Brother 37   Colon cancer Maternal Grandmother    Heart attack Paternal Grandmother 82   Diverticulitis Daughter        S/P colectomy   Diabetes Neg Hx    Stroke Neg Hx    Hypercalcemia Neg Hx    Stomach cancer Neg Hx    Pancreatic cancer Neg Hx    Esophageal cancer Neg Hx     Past Surgical History:  Procedure Laterality Date   CARDIAC CATHETERIZATION N/A 03/28/2015   Procedure: Left Heart Cath and Coronary Angiography;   Surgeon: Jolaine Artist, MD;  Location: [facility]AS[facility];  Service: Cardiovascular;  Laterality: N/A;   CATARACT EXTRACTION Bilateral    CATARACT EXTRACTION W/ INTRAOCULAR LENS IMPLANT     OS; Dr Kathrin Penner   COLONOSCOPY  1995, 2002, 2012  diverticulosis; Dr Henrene Pastor   CORONARY ARTERY BYPASS GRAFT N/A 04/01/2015   Procedure: CORONARY ARTERY BYPASS GRAFTING (CABG), ON PUMP, TIMES ONE, USING LEFT INTERNAL MAMMARY ARTERY;  Surgeon: Ivin Poot, MD;  Location: [facility];  Service: Open Heart Surgery;  Laterality: N/A;   EXCISION OF ATRIAL MYXOMA N/A 04/01/2015   Procedure: EXCISION OF ATRIAL MYXOMA;  Surgeon: Ivin Poot, MD;  Location: [facility];  Service: Open Heart Surgery;  Laterality: N/A;   ORIF ANKLE FRACTURE  11/14/2011   Procedure: OPEN REDUCTION INTERNAL FIXATION (ORIF) ANKLE FRACTURE;  Surgeon: Colin Rhein;  Location: [facility];  Service: Orthopedics;  Laterality: Left;  open reduction internal fixation trimalleolar ankle fracture   TEE WITHOUT CARDIOVERSION N/A 04/01/2015   Procedure: TRANSESOPHAGEAL ECHOCARDIOGRAM (TEE);  Surgeon: Ivin Poot, MD;  Location: [facility];  Service: Open Heart Surgery;  Laterality: N/A;   TONSILLECTOMY     TOTAL ABDOMINAL HYSTERECTOMY W/ BILATERAL SALPINGOOPHORECTOMY  1980   dysfunctional menses, endometriosis   VARICOSE VEIN SURGERY     1960, 1965, 2009   Social History   Occupational History   Occupation: Retired    Fish farm manager: RETIRED  Tobacco Use   Smoking status: Never   Smokeless tobacco: Never  Vaping Use   Vaping Use: Never used  Substance and Sexual Activity   Alcohol use: No   Drug use: No   Sexual activity: Not on file

## 2023-02-14 NOTE — Assessment & Plan Note (Signed)
Chronic Continue Prolia every 6 months Last Prolia injection in October 2023-next due April 2024 Continue calcium and vitamin D supplementation Check vitamin D level

## 2023-02-14 NOTE — Assessment & Plan Note (Signed)
Chronic Check a1c Low sugar / carb diet Encouraged as much activity as possible  

## 2023-02-14 NOTE — Progress Notes (Signed)
Office Visit Note   Patient: Valerie Rollins           Date of Birth: 24-May-1936           MRN: [id number] Visit Date: 01/25/2023              Requested by: Binnie Rail, MD [place],  [place] [id number] PCP: Binnie Rail, MD  Chief Complaint  Patient presents with   Left Ankle - Wound Check    registry pt #9 visit #5      HPI: Patient is an 87 year old woman who was seen in follow-up for venous insufficiency ulcer left ankle.  Patient is Registry patient 9 visit #5.  Assessment & Plan: Visit Diagnoses:  1. Venous stasis ulcer of left calf limited to breakdown of skin with varicose veins ([place])     Plan: Will continue with conservative management anticipate that we will remove the scab in several weeks.  Follow-Up Instructions: Return in about 1 week (around 02/01/2023).   Ortho Exam  Patient is alert, oriented, no adenopathy, well-dressed, normal affect, normal respiratory effort. Examination patient has healing of her venous ulcer status post Kerecis tissue graft.  She has a wound measurement of 10 x 15 mm that is covered with a dry scab.  There is no cellulitis no drainage no odor.  Imaging: No results found.   Labs: Lab Results  Component Value Date   HGBA1C 6.0 08/16/2022   HGBA1C 5.9 02/01/2022   HGBA1C 5.8 08/03/2021   LABURIC 5.6 09/02/2010   REPTSTATUS 11/16/2011 FINAL 11/15/2011   CULT NO GROWTH 11/15/2011   LABORGA STREPTOCOCCUS GROUP D;high probability for S.bovis 02/15/2013     Lab Results  Component Value Date   ALBUMIN 4.7 08/16/2022   ALBUMIN 4.7 02/01/2022   ALBUMIN 4.5 08/03/2021    Lab Results  Component Value Date   MG 2.0 04/02/2015   MG 2.2 04/02/2015   MG 2.8 (H) 04/01/2015   Lab Results  Component Value Date   VD25OH 41.01 08/16/2022   VD25OH 36.69 02/01/2022   VD25OH 43.05 08/03/2021    No results found for: "PREALBUMIN"    Latest Ref Rng & Units 08/16/2022    1:56 PM 02/01/2022    1:45 PM 08/03/2021     1:59 PM  CBC EXTENDED  WBC 4.0 - 10.5 K/uL 5.7  6.1  5.8   RBC 3.87 - 5.11 Mil/uL 4.84  4.81  4.89   Hemoglobin 12.0 - 15.0 g/dL 14.7  14.6  14.6   HCT 36.0 - 46.0 % 43.1  42.5  43.3   Platelets 150.0 - 400.0 K/uL 193.0  199.0  197.0   NEUT# 1.4 - 7.7 K/uL 3.8  3.8  3.9   Lymph# 0.7 - 4.0 K/uL 1.3  1.6  1.3      There is no height or weight on file to calculate BMI.  Orders:  No orders of the defined types were placed in this encounter.  No orders of the defined types were placed in this encounter.    Procedures: No procedures performed  Clinical Data: No additional findings.  ROS:  All other systems negative, except as noted in the HPI. Review of Systems  Objective: Vital Signs: There were no vitals taken for this visit.  Specialty Comments:  No specialty comments available.  PMFS History: Patient Active Problem List   Diagnosis Date Noted   Infected ulcer of skin ([place]) 12/01/2022   Fall 12/01/2022  Ecchymosis 12/01/2022   Thickened nails 02/02/2021   Long-term current use of opiate analgesic 10/11/2020   Degenerative spondylolisthesis 10/11/2020   Chronic venous stasis dermatitis of both lower extremities 01/21/2020   Hypercalcemia 07/19/2019   CKD (chronic kidney disease) stage 3, GFR 30-59 ml/min 07/19/2019   Chronic low back pain 05/07/2019   Candidal intertrigo 04/20/2018   Angular stomatitis 04/20/2018   Seasonal allergic rhinitis due to pollen 03/21/2018   Bilateral leg edema 12/31/2016   Prediabetes 01/10/2016   Osteoporosis - prolia Q 6 months 12/26/2015   Mass of neck 07/07/2015   Long-term (current) use of anticoagulants 04/16/2015   S/P CABG x 1 04/01/2015   CAD (coronary artery disease) 03/28/2015   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 s/p LIMA-LAD   Diverticulitis 2002   based on CT scan   Diverticulosis    Dr Henrene Pastor   DVT (deep venous thrombosis) ([place]) 2000   no trigger   Endometriosis    Gastritis 2006   @ Endo   GERD (gastroesophageal reflux disease)    Hiatal hernia    Hyperlipidemia    Hypertension    IBS (irritable bowel syndrome)    Ocular hypertension    glaucoma suspect, Dr. Kathrin Penner   Skin cancer    Superficial phlebitis     X 2, 1999, 2000   Syncope 11/12/11   in context CAP . Carotid Doppler exam was negative. 2D echocardiogram revealed mild dilation of the left atrium and moderate increase in the systolic pressureof  the pulmonary artery   UTI (lower urinary tract infection) 01/2013   Strep bovis ; PCN sensitive    Family History  Problem Relation Age of Onset   Alzheimer's disease Mother 55       TIAs   Heart attack Father 69   Hyperlipidemia Brother    Hypertension Brother    Hypertension Brother    Prostate cancer Brother 24   Colon cancer Maternal Grandmother    Heart attack Paternal Grandmother 82   Diverticulitis Daughter        S/P colectomy   Diabetes Neg Hx    Stroke Neg Hx    Hypercalcemia Neg Hx    Stomach cancer Neg Hx    Pancreatic cancer Neg Hx    Esophageal cancer Neg Hx     Past Surgical History:  Procedure Laterality Date   CARDIAC CATHETERIZATION N/A 03/28/2015    Procedure: Left Heart Cath and Coronary Angiography;  Surgeon: Jolaine Artist, MD;  Location: [facility];  Service: Cardiovascular;  Laterality: N/A;   CATARACT EXTRACTION Bilateral    CATARACT EXTRACTION W/ INTRAOCULAR LENS IMPLANT     OS;  Dr Kathrin Penner   COLONOSCOPY  1995, 2002, 2012   diverticulosis; Dr Henrene Pastor   CORONARY ARTERY BYPASS GRAFT N/A 04/01/2015   Procedure: CORONARY ARTERY BYPASS GRAFTING (CABG), ON PUMP, TIMES ONE, USING LEFT INTERNAL MAMMARY ARTERY;  Surgeon: Ivin Poot, MD;  Location: [facility];  Service: Open Heart Surgery;  Laterality: N/A;   EXCISION OF ATRIAL MYXOMA N/A 04/01/2015   Procedure: EXCISION OF ATRIAL MYXOMA;  Surgeon: Ivin Poot, MD;  Location: [facility];  Service: Open Heart Surgery;  Laterality: N/A;   ORIF ANKLE FRACTURE  11/14/2011   Procedure: OPEN REDUCTION INTERNAL FIXATION (ORIF) ANKLE FRACTURE;  Surgeon: Colin Rhein;  [facility];  Service: Orthopedics;  Laterality: Left;  open reduction internal fixation trimalleolar ankle fracture   TEE WITHOUT CARDIOVERSION N/A 04/01/2015   Procedure: TRANSESOPHAGEAL ECHOCARDIOGRAM (TEE);  Surgeon: Ivin Poot, MD;  Location: [facility];  Service: Open Heart Surgery;  Laterality: N/A;   TONSILLECTOMY     TOTAL ABDOMINAL HYSTERECTOMY W/ BILATERAL SALPINGOOPHORECTOMY  1980   dysfunctional menses, endometriosis   VARICOSE VEIN SURGERY     1960, 1965, 2009   Social History   Occupational History   Occupation: Retired    Fish farm manager: RETIRED  Tobacco Use   Smoking status: Never   Smokeless tobacco: Never  Vaping Use   Vaping Use: Never used  Substance and Sexual Activity   Alcohol use: No   Drug use: No   Sexual activity: Not on file

## 2023-02-14 NOTE — Assessment & Plan Note (Addendum)
Chronic Follows with cardiology On Eliquis 5 mg twice daily, metoprolol 25 mg in bed cmp,cbc

## 2023-02-14 NOTE — Assessment & Plan Note (Signed)
Chronic CMP, CBC 

## 2023-02-14 NOTE — Assessment & Plan Note (Addendum)
Chronic Blood pressure well controlled CMP Continue losartan 50 mg daily, metoprolol 25 mg bid

## 2023-02-14 NOTE — Assessment & Plan Note (Signed)
Chronic Secondary to venous insufficiency from varicose veins Continue furosemide 40 mg daily Continue wearing compression socks, elevating when able

## 2023-02-14 NOTE — Assessment & Plan Note (Signed)
Chronic Regular exercise and healthy diet encouraged Check lipid panel  Continue Crestor 10 mg daily 

## 2023-02-15 LAB — TSH: TSH: 2.33 u[IU]/mL (ref 0.35–5.50)

## 2023-02-15 LAB — VITAMIN D 25 HYDROXY (VIT D DEFICIENCY, FRACTURES): VITD: 39.35 ng/mL (ref 30.00–100.00)

## 2023-02-17 ENCOUNTER — Encounter: Payer: Self-pay | Admitting: Internal Medicine

## 2023-02-21 ENCOUNTER — Ambulatory Visit: Payer: Medicare HMO | Admitting: Podiatry

## 2023-02-21 ENCOUNTER — Encounter: Payer: Self-pay | Admitting: Podiatry

## 2023-02-21 DIAGNOSIS — M79675 Pain in left toe(s): Secondary | ICD-10-CM

## 2023-02-21 DIAGNOSIS — L84 Corns and callosities: Secondary | ICD-10-CM | POA: Diagnosis not present

## 2023-02-21 DIAGNOSIS — D689 Coagulation defect, unspecified: Secondary | ICD-10-CM

## 2023-02-21 DIAGNOSIS — M79674 Pain in right toe(s): Secondary | ICD-10-CM

## 2023-02-21 DIAGNOSIS — B351 Tinea unguium: Secondary | ICD-10-CM | POA: Diagnosis not present

## 2023-02-21 NOTE — Progress Notes (Signed)
  Subjective:  Patient ID: Valerie Rollins, female    DOB: [birth year],   MRN: [id number]  Chief Complaint  Patient presents with   Follow-up    Patient is here today for her routine foot care.    87 y.o. female presents for concern of thickened elongated and painful nails that are difficult to trim. Also relates some callus area on the ball of her right foot. Requesting to have them trimmed today. Patient is on eliquis  PCP:  Binnie Rail, MD    . Denies any other pedal complaints. Denies n/v/f/c.   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 s/p LIMA-LAD   Diverticulitis 2002   based on CT scan   Diverticulosis    Dr Henrene Pastor   DVT (deep venous thrombosis) 2000   no trigger   Endometriosis    Gastritis 2006   @ Endo   GERD (gastroesophageal reflux disease)    Hiatal hernia    Hyperlipidemia    Hypertension    IBS (irritable bowel syndrome)    Ocular hypertension    glaucoma suspect, Dr. Kathrin Penner   Skin cancer    Superficial phlebitis     X 2, 1999, 2000   Syncope 11/12/11   in context CAP . Carotid Doppler exam was negative. 2D echocardiogram revealed mild dilation of the left atrium and moderate increase in the systolic pressureof  the pulmonary artery   UTI (lower urinary tract infection) 01/2013   Strep bovis ; PCN sensitive    Objective:  Physical Exam: Vascular: DP/PT pulses 2/4 bilateral. CFT <3 seconds. Absent hair growth on digits. Edema noted to bilateral lower extremities. Xerosis noted bilaterally.  Skin. No lacerations or abrasions bilateral feet. Nails 1-5 bilateral  are thickened discolored and elongated with subungual debris. Hyperkeratotic lesion noted sub first metatarsal on the right foot.   Musculoskeletal: MMT  5/5 bilateral lower extremities in DF, PF, Inversion and Eversion. Deceased ROM in DF of ankle joint.  Neurological: Sensation intact to light touch. Protective intact diminished bilateral.    Assessment:   1. Pain due to onychomycosis of toenails of both feet   2. Callus   3. Coagulation defect      Plan:  Patient was evaluated and treated and all questions answered. -Mechanically debrided all nails 1-5 bilateral using sterile nail nipper and filed with dremel without incident  -Hyperkeartotic lesion debrided without incident with chisel.  -Answered all patient questions -Patient to return  in 3 months for at risk foot care -Patient advised to call the office if any problems or questions arise in the meantime.   Lorenda Peck, DPM

## 2023-03-07 ENCOUNTER — Ambulatory Visit: Payer: Medicare HMO

## 2023-03-07 DIAGNOSIS — M810 Age-related osteoporosis without current pathological fracture: Secondary | ICD-10-CM | POA: Diagnosis not present

## 2023-03-07 MED ORDER — DENOSUMAB 60 MG/ML ~~LOC~~ SOSY
60.0000 mg | PREFILLED_SYRINGE | Freq: Once | SUBCUTANEOUS | Status: AC
  Administered 2023-03-07: 60 mg via SUBCUTANEOUS

## 2023-03-07 NOTE — Progress Notes (Signed)
After obtaining consent, and per orders of Dr. Burns, injection of Prolia given by Kimmy Totten P Emmalin Jaquess. Patient instructed to report any adverse reaction to me immediately.   

## 2023-03-28 DIAGNOSIS — H40013 Open angle with borderline findings, low risk, bilateral: Secondary | ICD-10-CM | POA: Diagnosis not present

## 2023-05-04 ENCOUNTER — Other Ambulatory Visit: Payer: Self-pay | Admitting: Cardiology

## 2023-05-04 NOTE — Telephone Encounter (Signed)
Prescription refill request for Eliquis received. Indication: AF Last office visit: 08/09/22  Velta Addison MD Scr: 0.91 on 02/14/23 Age: 87 Weight: 64.1kg  Based on above findings Eliquis 5mg  twice daily is the appropriate dose.  Refill approved.

## 2023-05-10 ENCOUNTER — Other Ambulatory Visit: Payer: Self-pay | Admitting: Internal Medicine

## 2023-05-10 ENCOUNTER — Other Ambulatory Visit: Payer: Self-pay | Admitting: Cardiology

## 2023-05-10 DIAGNOSIS — I10 Essential (primary) hypertension: Secondary | ICD-10-CM

## 2023-05-10 DIAGNOSIS — E7800 Pure hypercholesterolemia, unspecified: Secondary | ICD-10-CM

## 2023-05-10 DIAGNOSIS — Z79891 Long term (current) use of opiate analgesic: Secondary | ICD-10-CM | POA: Diagnosis not present

## 2023-05-23 ENCOUNTER — Encounter: Payer: Self-pay | Admitting: Podiatry

## 2023-05-23 ENCOUNTER — Ambulatory Visit: Payer: Medicare HMO | Admitting: Podiatry

## 2023-05-23 DIAGNOSIS — M79675 Pain in left toe(s): Secondary | ICD-10-CM | POA: Diagnosis not present

## 2023-05-23 DIAGNOSIS — M79674 Pain in right toe(s): Secondary | ICD-10-CM

## 2023-05-23 DIAGNOSIS — B351 Tinea unguium: Secondary | ICD-10-CM

## 2023-05-23 DIAGNOSIS — D689 Coagulation defect, unspecified: Secondary | ICD-10-CM

## 2023-05-23 DIAGNOSIS — L84 Corns and callosities: Secondary | ICD-10-CM

## 2023-05-23 NOTE — Progress Notes (Signed)
  Subjective:  Patient ID: Valerie Rollins, female    DOB: [birth year],   MRN: [id number]  No chief complaint on file.   87 y.o. female presents for concern of thickened elongated and painful nails that are difficult to trim. Also relates some callus area on the ball of her right foot. Requesting to have them trimmed today. Patient is on eliquis  PCP:  Pincus Sanes, MD    . Denies any other pedal complaints. Denies n/v/f/c.   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 s/p LIMA-LAD   Diverticulitis 2002   based on CT scan   Diverticulosis    Dr Marina Goodell   DVT (deep venous thrombosis) (HCC) 2000   no trigger   Endometriosis    Gastritis 2006   @ Endo   GERD (gastroesophageal reflux disease)    Hiatal hernia    Hyperlipidemia    Hypertension    IBS (irritable bowel syndrome)    Ocular hypertension    glaucoma suspect, Dr. Dagoberto Ligas   Skin cancer    Superficial phlebitis     X 2, 1999, 2000   Syncope 11/12/11   in context CAP . Carotid Doppler exam was negative. 2D echocardiogram revealed mild dilation of the left atrium and moderate increase in the systolic pressureof  the pulmonary artery   UTI (lower urinary tract infection) 01/2013   Strep bovis ; PCN sensitive    Objective:  Physical Exam: Vascular: DP/PT pulses 2/4 bilateral. CFT <3 seconds. Absent hair growth on digits. Edema noted to bilateral lower extremities. Xerosis noted bilaterally.  Skin. No lacerations or abrasions bilateral feet. Nails 1-5 bilateral  are thickened discolored and elongated with subungual debris. Hyperkeratotic lesion noted sub first metatarsal on the right foot.   Musculoskeletal: MMT 5/5 bilateral lower extremities in DF, PF, Inversion and Eversion.  Deceased ROM in DF of ankle joint.  Neurological: Sensation intact to light touch. Protective intact diminished bilateral.    Assessment:   1. Pain due to onychomycosis of toenails of both feet   2. Coagulation defect (HCC)   3. Callus      Plan:  Patient was evaluated and treated and all questions answered. -Mechanically debrided all nails 1-5 bilateral using sterile nail nipper and filed with dremel without incident  -Hyperkeartotic lesion debrided without incident with chisel.  -Answered all patient questions -Patient to return  in 3 months for at risk foot care -Patient advised to call the office if any problems or questions arise in the meantime.   Louann Sjogren, DPM

## 2023-06-27 ENCOUNTER — Ambulatory Visit (INDEPENDENT_AMBULATORY_CARE_PROVIDER_SITE_OTHER): Payer: Medicare HMO

## 2023-06-27 VITALS — Ht 64.0 in | Wt 139.0 lb

## 2023-06-27 DIAGNOSIS — Z0000 Encounter for general adult medical examination without abnormal findings: Secondary | ICD-10-CM | POA: Diagnosis not present

## 2023-06-27 NOTE — Progress Notes (Signed)
Subjective:   Valerie Rollins is a 87 y.o. female who presents for Medicare Annual (Subsequent) preventive examination.  Visit Complete: Virtual  I connected with  Valerie Rollins on 06/27/23 by a audio enabled telemedicine application and verified that I am speaking with the correct person using two identifiers.  Patient Location: Home  Provider Location: Office/Clinic  I discussed the limitations of evaluation and management by telemedicine. The patient expressed understanding and agreed to proceed.  Vital Signs: Per patient no change in vitals since last visit.  Review of Systems    Cardiac Risk Factors include: advanced age (>3men, >73 women);hypertension;dyslipidemia;Other (see comment), Risk factor comments: CKD, A-Fib     Objective:    Today's Vitals   06/27/23 1521  Weight: 139 lb (63 kg)  Height: 5\' 4"  (1.626 m)   Body mass index is 23.86 kg/m.     06/27/2023    3:37 PM 08/04/2021    2:21 PM 01/09/2018    2:36 PM 05/20/2015    2:54 PM 04/01/2015    5:57 AM 11/14/2011    6:00 AM 11/12/2011   12:25 PM  Advanced Directives  Does Patient Have a Medical Advance Directive? Yes Yes Yes No Yes Patient has advance directive, copy in chart Patient has advance directive, copy not in chart  Type of Advance Directive Healthcare Power of [place];Living will Living will;Healthcare Power of [employer] Power of [place];Living will  Healthcare Power of [place];Living will Healthcare Power of [place];Living will Healthcare Power of [place];Living will  Does patient want to make changes to medical advance directive?  No - Patient declined   No - Patient declined    Copy of Healthcare Power of Attorney in Chart? No - copy requested No - copy requested No - copy requested  No - copy requested Copy requested from family Copy requested from family  Would patient like information on creating a medical advance directive?    No - patient declined information     Pre-existing out of  facility DNR order (yellow form or pink MOST form)      No     Current Medications (verified) Outpatient Encounter Medications as of 06/27/2023  Medication Sig   albuterol (VENTOLIN HFA) 108 (90 Base) MCG/ACT inhaler INHALE 2 PUFFS BY MOUTH EVERY 6 HOURS AS NEEDED FOR WHEEZE OR SHORTNESS OF BREATH   Ascorbic Acid (VITAMIN C) 100 MG tablet Take 100 mg by mouth daily.   calcium citrate (CALCITRATE - DOSED IN MG ELEMENTAL CALCIUM) 950 (200 Ca) MG tablet Take 200 mg of elemental calcium by mouth 2 (two) times daily.   Cholecalciferol (VITAMIN D3) 1000 UNITS CAPS Take 1,000 Units by mouth daily.    Coenzyme Q10 (CO Q 10 PO) Take by mouth daily.   denosumab (PROLIA) 60 MG/ML SOSY injection Inject 60 mg into the skin every 6 (six) months.   dicyclomine (BENTYL) 20 MG tablet TAKE 1 TABLET (20 MG TOTAL) BY MOUTH EVERY 6 (SIX) HOURS AS NEEDED FOR PAIN   ELIQUIS 5 MG TABS tablet TAKE 1 TABLET BY MOUTH TWICE A DAY   fexofenadine (ALLEGRA) 180 MG tablet Take 1 tablet (180 mg total) by mouth daily.   furosemide (LASIX) 40 MG tablet TAKE 1 TABLET BY MOUTH DAILY. TAKE AN EXTRA 1/2 TABLET DAILY AS NEEDED   Lactobacillus (PROBIOTIC ACIDOPHILUS PO) Take 1 capsule by mouth daily.   losartan (COZAAR) 50 MG tablet TAKE 1 TABLET BY MOUTH EVERY DAY   metoprolol tartrate (LOPRESSOR) 25 MG tablet  Take 1 tablet (25 mg total) by mouth 2 (two) times daily. (Patient taking differently: Take 25 mg by mouth once.)   omeprazole (PRILOSEC) 20 MG capsule TAKE 1 CAPSULE BY MOUTH EVERY DAY   rosuvastatin (CRESTOR) 10 MG tablet TAKE 1 TABLET BY MOUTH EVERY DAY   traMADol (ULTRAM) 50 MG tablet Take 1 tablet (50 mg total) by mouth every 6 (six) hours as needed. Dr.Ramos (Patient taking differently: Take 50 mg by mouth every 6 (six) hours as needed. Dr.Ramos; pt takes 1 tablet every night, maybe 1 during the day as needed)   amoxicillin (AMOXIL) 500 MG capsule SMARTSIG:4 Capsule(s) By Mouth (Patient not taking: Reported on 06/27/2023)    augmented betamethasone dipropionate (DIPROLENE-AF) 0.05 % cream Apply topically as directed. (Patient not taking: Reported on 06/27/2023)   COMIRNATY syringe    hydroxypropyl methylcellulose / hypromellose (ISOPTO TEARS / GONIOVISC) 2.5 % ophthalmic solution 1 drop. (Patient not taking: Reported on 06/27/2023)   No facility-administered encounter medications on file as of 06/27/2023.    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 s/p LIMA-LAD   Diverticulitis 2002   based on CT scan   Diverticulosis    Dr Marina Goodell   DVT (deep venous thrombosis) (HCC) 2000   no trigger   Endometriosis    Gastritis 2006   @ Endo   GERD (gastroesophageal reflux disease)    Hiatal hernia    Hyperlipidemia    Hypertension    IBS (irritable bowel syndrome)    Ocular hypertension    glaucoma suspect, Dr. Dagoberto Ligas   Skin cancer    Superficial phlebitis     X 2, 1999, 2000   Syncope 11/12/11   in context CAP . Carotid Doppler exam was negative. 2D echocardiogram revealed mild dilation of the left atrium and moderate increase in the systolic pressureof  the pulmonary artery   UTI (lower urinary tract infection) 01/2013   Strep bovis ; PCN sensitive   Past Surgical History:  Procedure Laterality Date   CARDIAC CATHETERIZATION N/A 03/28/2015   Procedure: Left Heart Cath and Coronary Angiography;  Surgeon: Dolores Patty, MD;  Location: [facility];  Service: Cardiovascular;  Laterality: N/A;   CATARACT EXTRACTION Bilateral    CATARACT EXTRACTION W/ INTRAOCULAR LENS IMPLANT     OS; Dr Dagoberto Ligas   COLONOSCOPY  1995, 2002, 2012    diverticulosis; Dr Marina Goodell   CORONARY ARTERY BYPASS GRAFT N/A 04/01/2015   Procedure: CORONARY ARTERY BYPASS GRAFTING (CABG), ON PUMP, TIMES ONE, USING LEFT INTERNAL MAMMARY ARTERY;  Surgeon: Kerin Perna, MD;  Location: [facility] OR;  Service: Open Heart Surgery;  Laterality: N/A;   EXCISION OF ATRIAL MYXOMA N/A 04/01/2015   Procedure: EXCISION OF ATRIAL MYXOMA;  Surgeon: Kerin Perna, MD;  Location: [facility] OR;  Service: Open Heart Surgery;  Laterality: N/A;   ORIF ANKLE FRACTURE  11/14/2011   Procedure: OPEN REDUCTION INTERNAL FIXATION (ORIF) ANKLE FRACTURE;  Surgeon: Sherri Rad;  Location: [facility] ORS;  Service: Orthopedics;  Laterality: Left;  open reduction internal fixation trimalleolar ankle fracture   TEE WITHOUT CARDIOVERSION N/A 04/01/2015   Procedure: TRANSESOPHAGEAL  ECHOCARDIOGRAM (TEE);  Surgeon: Kerin Perna, MD;  Location: [facility] OR;  Service: Open Heart Surgery;  Laterality: N/A;   TONSILLECTOMY     TOTAL ABDOMINAL HYSTERECTOMY W/ BILATERAL SALPINGOOPHORECTOMY  1980   dysfunctional menses, endometriosis   VARICOSE VEIN SURGERY     1960, 1965, 2009   Family History  Problem Relation Age of Onset   Alzheimer's disease Mother 48       TIAs   Heart attack Father 37   Hyperlipidemia Brother    Hypertension Brother    Hypertension Brother    Prostate cancer Brother 27   Colon cancer Maternal Grandmother    Heart attack Paternal Grandmother 57   Diverticulitis Daughter        S/P colectomy   Diabetes Neg Hx    Stroke Neg Hx    Hypercalcemia Neg Hx    Stomach cancer Neg Hx    Pancreatic cancer Neg Hx    Esophageal cancer Neg Hx    Social History   Socioeconomic History   Marital status: Married    Spouse name: Chrissie Noa Biomedical scientist)   Number of children: 2   Years of education: Not on file   Highest education level: Not on file  Occupational History   Occupation: Retired    Associate Professor: RETIRED  Tobacco Use   Smoking status: Never   Smokeless tobacco: Never  Vaping Use   Vaping  status: Never Used  Substance and Sexual Activity   Alcohol use: No   Drug use: No   Sexual activity: Not on file  Other Topics Concern   Not on file  Social History Narrative   REG EXERCISE   HAD COLONOSCOPY AND ENDOSCOPY DONE DATES UNKNOWN   Social Determinants of Health   Financial Resource Strain: Low Risk  (06/27/2023)   Overall Financial Resource Strain (CARDIA)    Difficulty of Paying Living Expenses: Not hard at all  Food Insecurity: No Food Insecurity (06/27/2023)   Hunger Vital Sign    Worried About Running Out of Food in the Last Year: Never true    Ran Out of Food in the Last Year: Never true  Transportation Needs: No Transportation Needs (06/27/2023)   PRAPARE - Administrator, Civil Service (Medical): No    Lack of Transportation (Non-Medical): No  Physical Activity: Inactive (06/27/2023)   Exercise Vital Sign    Days of Exercise per Week: 0 days    Minutes of Exercise per Session: 0 min  Stress: No Stress Concern Present (06/27/2023)   [employer] of Occupational Health - Occupational Stress Questionnaire    Feeling of Stress : Only a little  Social Connections: Unknown (06/27/2023)   Social Connection and Isolation Panel [NHANES]    Frequency of Communication with Friends and Family: More than three times a week    Frequency of Social Gatherings with Friends and Family: Once a week    Attends Religious Services: Patient declined    Database administrator or Organizations: No    Attends Engineer, structural: Never    Marital Status: Married    Tobacco Counseling Counseling given: Not Answered   Clinical Intake:  Pre-visit preparation completed: Yes  Pain : No/denies pain     BMI - recorded: 23.86 Nutritional Status: BMI of 19-24  Normal Nutritional Risks: None Diabetes: No  How often do you need to have someone help you when you read instructions, pamphlets, or other written materials from your doctor or pharmacy?: 1 -  Never  Interpreter Needed?: No  Information entered by ::  , RMA   Activities of Daily Living    06/27/2023    3:25 PM  In your present state of health, do you have any difficulty performing the following activities:  Hearing? 0  Vision? 0  Difficulty concentrating or making decisions? 0  Walking or climbing stairs? 0  Dressing or bathing? 0  Doing errands, shopping? 1  Comment Husband and children drive  Preparing Food and eating ? N  Using the Toilet? N  Managing your Medications? N  Managing your Finances? N  Housekeeping or managing your Housekeeping? N    Patient Care Team: Pincus Sanes, MD as PCP - General (Internal Medicine) Jens Som Madolyn Frieze, MD as PCP - Cardiology (Cardiology) Maris Berger, MD as Consulting Physician (Ophthalmology) Szabat, Vinnie Level, [facility] (Inactive) (Pharmacist)  Indicate any recent Medical Services you may have received from other than [facility] providers in the past year (date may be approximate).     Assessment:   This is a routine wellness examination for Braylyn.  Hearing/Vision screen Hearing Screening - Comments:: Denies hearing difficulties   Vision Screening - Comments:: Wears eyeglasses  Dietary issues and exercise activities discussed:     Goals Addressed   None   Depression Screen    06/27/2023    3:43 PM 02/14/2023    1:13 PM 12/01/2022    3:26 PM 02/01/2022    2:50 PM 08/04/2021    2:28 PM 01/21/2020    2:56 PM 01/15/2019    1:32 PM  PHQ 2/9 Scores  PHQ - 2 Score 0 0 0 0 0 0 0  PHQ- 9 Score 0 0 0        Fall Risk    06/27/2023    3:37 PM 02/14/2023    1:12 PM 12/01/2022    3:26 PM 02/01/2022    2:49 PM 08/04/2021    2:48 PM  Fall Risk   Falls in the past year? 1 1 1  0 0  Number falls in past yr: 0 0 0 0 0  Injury with Fall? 1 1 1  0 0  Risk for fall due to : Medication side effect History of fall(s) History of fall(s) No Fall Risks No Fall Risks  Follow up Falls evaluation completed;Falls prevention discussed  Falls evaluation completed Falls evaluation completed Falls evaluation completed Falls evaluation completed    MEDICARE RISK AT HOME:  Medicare Risk at Home - 06/27/23 1538     Any stairs in or around the home? Yes    If so, are there any without handrails? Yes    Home free of loose throw rugs in walkways, pet beds, electrical cords, etc? Yes    Adequate lighting in your home to reduce risk of falls? Yes    Life alert? No    Use of a cane, walker or w/c? Yes    Grab bars in the bathroom? Yes    Shower chair or bench in shower? Yes    Elevated toilet seat or a handicapped toilet? Yes             TIMED UP AND GO:  Was the test performed?  No    Cognitive Function:        06/27/2023    3:39 PM  6CIT Screen  What Year? 0 points  What month? 0 points  What time? 0 points  Count back from 20 0 points    Immunizations Immunization History  Administered  Date(s) Administered   Fluad Quad(high Dose 65+) 07/16/2019, 09/06/2020, 08/03/2021, 08/16/2022   H1N1 12/09/2008   Influenza Split 09/23/2011, 08/31/2012   Influenza Whole 09/28/2007, 09/09/2008, 09/08/2009, 08/14/2010   Influenza, High Dose Seasonal PF 09/20/2013, 09/16/2015, 09/07/2016, 08/22/2017, 09/04/2018   Influenza,inj,Quad PF,6+ Mos 09/13/2014   Influenza,inj,quad, With Preservative 08/22/2017   PFIZER(Purple Top)SARS-COV-2 Vaccination 12/14/2019, 01/03/2020, 09/02/2020, 03/16/2021   Pfizer Covid-19 Vaccine Bivalent Booster 35yrs & up 09/13/2022   Pneumococcal Conjugate-13 09/13/2014   Pneumococcal Polysaccharide-23 09/25/2015   Td 06/02/2005   Tdap 11/29/2016   Zoster Recombinant(Shingrix) 03/10/2018, 06/23/2018   Zoster, Live 09/18/2012    TDAP status: Up to date  Flu Vaccine status: Up to date  Pneumococcal vaccine status: Up to date  Covid-19 vaccine status: Completed vaccines  Qualifies for Shingles Vaccine? Yes   Zostavax completed Yes   Shingrix Completed?: Yes  Screening Tests Health  Maintenance  Topic Date Due   COVID-19 Vaccine (6 - 2023-24 season) 11/08/2022   INFLUENZA VACCINE  06/23/2023   DEXA SCAN  03/01/2024   Medicare Annual Wellness (AWV)  06/26/2024   DTaP/Tdap/Td (3 - Td or Tdap) 11/29/2026   Pneumonia Vaccine 28+ Years old  Completed   Zoster Vaccines- Shingrix  Completed   HPV VACCINES  Aged Out    Health Maintenance  Health Maintenance Due  Topic Date Due   COVID-19 Vaccine (6 - 2023-24 season) 11/08/2022   INFLUENZA VACCINE  06/23/2023    Colorectal cancer screening: No longer required.   Mammogram status: Completed 10/25/2022. Repeat every year  Bone Density status: Completed 03/01/2022. Results reflect: Bone density results: OSTEOPOROSIS. Repeat every 2 years.  Lung Cancer Screening: (Low Dose CT Chest recommended if Age 1-80 years, 20 pack-year currently smoking OR have quit w/in 15years.) does not qualify.   Lung Cancer Screening Referral: N/a  Additional Screening:  Hepatitis C Screening: does not qualify;   Vision Screening: Recommended annual ophthalmology exams for early detection of glaucoma and other disorders of the eye. Is the patient up to date with their annual eye exam?  Yes  Who is the provider or what is the name of the office in which the patient attends annual eye exams? Dr. Charlotte Sanes If pt is not established with a provider, would they like to be referred to a provider to establish care? No .   Dental Screening: Recommended annual dental exams for proper oral hygiene   Community Resource Referral / Chronic Care Management: CRR required this visit?  No   CCM required this visit?  No     Plan:     I have personally reviewed and noted the following in the patient's chart:   Medical and social history Use of alcohol, tobacco or illicit drugs  Current medications and supplements including opioid prescriptions. Patient is not currently taking opioid prescriptions. Functional ability and status Nutritional  status Physical activity Advanced directives List of other physicians Hospitalizations, surgeries, and ER visits in previous 12 months Vitals Screenings to include cognitive, depression, and falls Referrals and appointments  In addition, I have reviewed and discussed with patient certain preventive protocols, quality metrics, and best practice recommendations. A written personalized care plan for preventive services as well as general preventive health recommendations were provided to patient.      L , CMA   06/27/2023   After Visit Summary: (MyChart) Due to this being a telephonic visit, the after visit summary with patients personalized plan was offered to patient via MyChart   Nurse Notes: Patient is  scheduled for her yearly mammogram in December.  Patient had no concerns today.

## 2023-06-27 NOTE — Patient Instructions (Signed)
Valerie Rollins , Thank you for taking time to come for your Medicare Wellness Visit. I appreciate your ongoing commitment to your health goals. Please review the following plan we discussed and let me know if I can assist you in the future.   Referrals/Orders/Follow-Ups/Clinician Recommendations: Keep up the good work.  This is a list of the screening recommended for you and due dates:  Health Maintenance  Topic Date Due   COVID-19 Vaccine (6 - 2023-24 season) 11/08/2022   Flu Shot  06/23/2023   DEXA scan (bone density measurement)  03/01/2024   Medicare Annual Wellness Visit  06/26/2024   DTaP/Tdap/Td vaccine (3 - Td or Tdap) 11/29/2026   Pneumonia Vaccine  Completed   Zoster (Shingles) Vaccine  Completed   HPV Vaccine  Aged Out    Advanced directives: (Copy Requested) Please bring a copy of your health care power of attorney and living will to the office to be added to your chart at your convenience.  Next Medicare Annual Wellness Visit scheduled for next year: Yes  Preventive Care 12 Years and Older, Female Preventive care refers to lifestyle choices and visits with your health care provider that can promote health and wellness. What does preventive care include? A yearly physical exam. This is also called an annual well check. Dental exams once or twice a year. Routine eye exams. Ask your health care provider how often you should have your eyes checked. Personal lifestyle choices, including: Daily care of your teeth and gums. Regular physical activity. Eating a healthy diet. Avoiding tobacco and drug use. Limiting alcohol use. Practicing safe sex. Taking low-dose aspirin every day. Taking vitamin and mineral supplements as recommended by your health care provider. What happens during an annual well check? The services and screenings done by your health care provider during your annual well check will depend on your age, overall health, lifestyle risk factors, and family history  of disease. Counseling  Your health care provider may ask you questions about your: Alcohol use. Tobacco use. Drug use. Emotional well-being. Home and relationship well-being. Sexual activity. Eating habits. History of falls. Memory and ability to understand (cognition). Work and work Astronomer. Reproductive health. Screening  You may have the following tests or measurements: Height, weight, and BMI. Blood pressure. Lipid and cholesterol levels. These may be checked every 5 years, or more frequently if you are over 51 years old. Skin check. Lung cancer screening. You may have this screening every year starting at age 12 if you have a 30-pack-year history of smoking and currently smoke or have quit within the past 15 years. Fecal occult blood test (FOBT) of the stool. You may have this test every year starting at age 85. Flexible sigmoidoscopy or colonoscopy. You may have a sigmoidoscopy every 5 years or a colonoscopy every 10 years starting at age 2. Hepatitis C blood test. Hepatitis B blood test. Sexually transmitted disease (STD) testing. Diabetes screening. This is done by checking your blood sugar (glucose) after you have not eaten for a while (fasting). You may have this done every 1-3 years. Bone density scan. This is done to screen for osteoporosis. You may have this done starting at age 19. Mammogram. This may be done every 1-2 years. Talk to your health care provider about how often you should have regular mammograms. Talk with your health care provider about your test results, treatment options, and if necessary, the need for more tests. Vaccines  Your health care provider may recommend certain vaccines, such as:  Influenza vaccine. This is recommended every year. Tetanus, diphtheria, and acellular pertussis (Tdap, Td) vaccine. You may need a Td booster every 10 years. Zoster vaccine. You may need this after age 26. Pneumococcal 13-valent conjugate (PCV13) vaccine. One  dose is recommended after age 21. Pneumococcal polysaccharide (PPSV23) vaccine. One dose is recommended after age 47. Talk to your health care provider about which screenings and vaccines you need and how often you need them. This information is not intended to replace advice given to you by your health care provider. Make sure you discuss any questions you have with your health care provider. Document Released: 12/05/2015 Document Revised: 07/28/2016 Document Reviewed: 09/09/2015 Elsevier Interactive Patient Education  2017 [employer].  Fall Prevention in the Home Falls can cause injuries. They can happen to people of all ages. There are many things you can do to make your home safe and to help prevent falls. What can I do on the outside of my home? Regularly fix the edges of walkways and driveways and fix any cracks. Remove anything that might make you trip as you walk through a door, such as a raised step or threshold. Trim any bushes or trees on the path to your home. Use bright outdoor lighting. Clear any walking paths of anything that might make someone trip, such as rocks or tools. Regularly check to see if handrails are loose or broken. Make sure that both sides of any steps have handrails. Any raised decks and porches should have guardrails on the edges. Have any leaves, snow, or ice cleared regularly. Use sand or salt on walking paths during winter. Clean up any spills in your garage right away. This includes oil or grease spills. What can I do in the bathroom? Use night lights. Install grab bars by the toilet and in the tub and shower. Do not use towel bars as grab bars. Use non-skid mats or decals in the tub or shower. If you need to sit down in the shower, use a plastic, non-slip stool. Keep the floor dry. Clean up any water that spills on the floor as soon as it happens. Remove soap buildup in the tub or shower regularly. Attach bath mats securely with double-sided  non-slip rug tape. Do not have throw rugs and other things on the floor that can make you trip. What can I do in the bedroom? Use night lights. Make sure that you have a light by your bed that is easy to reach. Do not use any sheets or blankets that are too big for your bed. They should not hang down onto the floor. Have a firm chair that has side arms. You can use this for support while you get dressed. Do not have throw rugs and other things on the floor that can make you trip. What can I do in the kitchen? Clean up any spills right away. Avoid walking on wet floors. Keep items that you use a lot in easy-to-reach places. If you need to reach something above you, use a strong step stool that has a grab bar. Keep electrical cords out of the way. Do not use floor polish or wax that makes floors slippery. If you must use wax, use non-skid floor wax. Do not have throw rugs and other things on the floor that can make you trip. What can I do with my stairs? Do not leave any items on the stairs. Make sure that there are handrails on both sides of the stairs and use them.  Fix handrails that are broken or loose. Make sure that handrails are as long as the stairways. Check any carpeting to make sure that it is firmly attached to the stairs. Fix any carpet that is loose or worn. Avoid having throw rugs at the top or bottom of the stairs. If you do have throw rugs, attach them to the floor with carpet tape. Make sure that you have a light switch at the top of the stairs and the bottom of the stairs. If you do not have them, ask someone to add them for you. What else can I do to help prevent falls? Wear shoes that: Do not have high heels. Have rubber bottoms. Are comfortable and fit you well. Are closed at the toe. Do not wear sandals. If you use a stepladder: Make sure that it is fully opened. Do not climb a closed stepladder. Make sure that both sides of the stepladder are locked into place. Ask  someone to hold it for you, if possible. Clearly mark and make sure that you can see: Any grab bars or handrails. First and last steps. Where the edge of each step is. Use tools that help you move around (mobility aids) if they are needed. These include: Canes. Walkers. Scooters. Crutches. Turn on the lights when you go into a dark area. Replace any light bulbs as soon as they burn out. Set up your furniture so you have a clear path. Avoid moving your furniture around. If any of your floors are uneven, fix them. If there are any pets around you, be aware of where they are. Review your medicines with your doctor. Some medicines can make you feel dizzy. This can increase your chance of falling. Ask your doctor what other things that you can do to help prevent falls. This information is not intended to replace advice given to you by your health care provider. Make sure you discuss any questions you have with your health care provider. Document Released: 09/04/2009 Document Revised: 04/15/2016 Document Reviewed: 12/13/2014 Elsevier Interactive Patient Education  2017 [employer].

## 2023-07-07 ENCOUNTER — Encounter (INDEPENDENT_AMBULATORY_CARE_PROVIDER_SITE_OTHER): Payer: Self-pay

## 2023-07-08 ENCOUNTER — Encounter: Payer: Self-pay | Admitting: Internal Medicine

## 2023-07-27 ENCOUNTER — Encounter: Payer: Self-pay | Admitting: Internal Medicine

## 2023-07-31 NOTE — Patient Instructions (Addendum)
      Blood work was ordered.   The lab is on the first floor.    Medications changes include :   none     Return in about 6 months (around 01/29/2024) for Physical Exam.

## 2023-07-31 NOTE — Progress Notes (Unsigned)
Subjective:    Patient ID: Valerie Rollins, female    DOB: 1936-05-02, 87 y.o.   MRN: [id number]     HPI Valerie Rollins is here for follow up of her chronic medical problems.  Right knee about one week ago started to be more swollen than usual.  She has chronic pain in the knee and has not really had it evaluated.  She ices it on occasion and elevates it.  She is not really interested in having it further evaluated.  She does wear compression socks daily and that looks like it may be pushing some of the fluid up to her knees.  Pain radiates from the right knee up to her hip  No other concerns   Medications and allergies reviewed with patient and updated if appropriate.  Current Outpatient Medications on File Prior to Visit  Medication Sig Dispense Refill   albuterol (VENTOLIN HFA) 108 (90 Base) MCG/ACT inhaler INHALE 2 PUFFS BY MOUTH EVERY 6 HOURS AS NEEDED FOR WHEEZE OR SHORTNESS OF BREATH 6.7 each 5   amoxicillin (AMOXIL) 500 MG capsule Take 500 mg by mouth daily. 4 tablets prior to dental appointments.     Ascorbic Acid (VITAMIN C) 100 MG tablet Take 100 mg by mouth daily.     calcium citrate (CALCITRATE - DOSED IN MG ELEMENTAL CALCIUM) 950 (200 Ca) MG tablet Take 200 mg of elemental calcium by mouth 2 (two) times daily.     Cholecalciferol (VITAMIN D3) 1000 UNITS CAPS Take 1,000 Units by mouth daily.      Coenzyme Q10 (CO Q 10 PO) Take by mouth daily.     COMIRNATY syringe      denosumab (PROLIA) 60 MG/ML SOSY injection Inject 60 mg into the skin every 6 (six) months.     dicyclomine (BENTYL) 20 MG tablet TAKE 1 TABLET (20 MG TOTAL) BY MOUTH EVERY 6 (SIX) HOURS AS NEEDED FOR PAIN 360 tablet 1   ELIQUIS 5 MG TABS tablet TAKE 1 TABLET BY MOUTH TWICE A DAY 60 tablet 5   fexofenadine (ALLEGRA) 180 MG tablet Take 1 tablet (180 mg total) by mouth daily. 30 tablet 11   furosemide (LASIX) 40 MG tablet TAKE 1 TABLET BY MOUTH DAILY. TAKE AN EXTRA 1/2 TABLET DAILY AS NEEDED 135 tablet 1    Lactobacillus (PROBIOTIC ACIDOPHILUS PO) Take 1 capsule by mouth daily.     losartan (COZAAR) 50 MG tablet TAKE 1 TABLET BY MOUTH EVERY DAY 90 tablet 3   metoprolol tartrate (LOPRESSOR) 25 MG tablet Take 1 tablet (25 mg total) by mouth 2 (two) times daily. 180 tablet 3   omeprazole (PRILOSEC) 20 MG capsule TAKE 1 CAPSULE BY MOUTH EVERY DAY 90 capsule 3   Polyethyl Glycol-Propyl Glycol (SYSTANE OP) Apply 1 drop to eye in the morning and at bedtime. 1 drop per eye twice daily.     rosuvastatin (CRESTOR) 10 MG tablet TAKE 1 TABLET BY MOUTH EVERY DAY 90 tablet 3   traMADol (ULTRAM) 50 MG tablet Take 1 tablet (50 mg total) by mouth every 6 (six) hours as needed. Dr.Ramos (Patient taking differently: Take 50 mg by mouth every 6 (six) hours as needed. Dr.Ramos; pt takes 1 tablet every night, maybe 1 during the day as needed) 30 tablet 0   augmented betamethasone dipropionate (DIPROLENE-AF) 0.05 % cream Apply topically as directed. (Patient not taking: Reported on 06/27/2023)     No current facility-administered medications on file prior to visit.  Review of Systems  Constitutional:  Negative for fever.  Respiratory:  Positive for cough (occ dry cough - tickle in throat). Negative for shortness of breath and wheezing.   Cardiovascular:  Positive for leg swelling. Negative for chest pain and palpitations.  Neurological:  Positive for dizziness (with quick movements). Negative for light-headedness and headaches.       Objective:   Vitals:   08/01/23 1354  BP: 128/72  Pulse: 68  Temp: 98.1 F (36.7 C)  SpO2: 98%   BP Readings from Last 3 Encounters:  08/01/23 128/72  02/14/23 132/78  12/01/22 136/82   Wt Readings from Last 3 Encounters:  08/01/23 140 lb 9.6 oz (63.8 kg)  06/27/23 139 lb (63 kg)  02/14/23 141 lb (64 kg)   Body mass index is 24.13 kg/m.    Physical Exam Constitutional:      General: She is not in acute distress.    Appearance: Normal appearance.  HENT:     Head:  Normocephalic and atraumatic.  Eyes:     Conjunctiva/sclera: Conjunctivae normal.  Cardiovascular:     Rate and Rhythm: Normal rate and regular rhythm.     Heart sounds: Murmur (3/6 systolic) heard.  Pulmonary:     Effort: Pulmonary effort is normal. No respiratory distress.     Breath sounds: Normal breath sounds. No wheezing.  Musculoskeletal:     Cervical back: Neck supple.     Right lower leg: Edema (1 +) present.     Left lower leg: Edema (1+) present.  Lymphadenopathy:     Cervical: No cervical adenopathy.  Skin:    General: Skin is warm and dry.     Findings: No rash.  Neurological:     Mental Status: She is alert. Mental status is at baseline.  Psychiatric:        Mood and Affect: Mood normal.        Behavior: Behavior normal.        Lab Results  Component Value Date   WBC 6.1 02/14/2023   HGB 14.7 02/14/2023   HCT 43.4 02/14/2023   PLT 203.0 02/14/2023   GLUCOSE 85 02/14/2023   CHOL 161 02/14/2023   TRIG 47.0 02/14/2023   HDL 90.00 02/14/2023   LDLDIRECT 152.6 07/31/2013   LDLCALC 61 02/14/2023   ALT 42 (H) 02/14/2023   AST 37 02/14/2023   NA 141 02/14/2023   K 4.9 02/14/2023   CL 102 02/14/2023   CREATININE 0.91 02/14/2023   BUN 36 (H) 02/14/2023   CO2 31 02/14/2023   TSH 2.33 02/14/2023   INR 1.4 07/31/2015   HGBA1C 5.9 02/14/2023     Assessment & Plan:    See Problem List for Assessment and Plan of chronic medical problems.

## 2023-08-01 ENCOUNTER — Encounter: Payer: Self-pay | Admitting: Internal Medicine

## 2023-08-01 ENCOUNTER — Ambulatory Visit (INDEPENDENT_AMBULATORY_CARE_PROVIDER_SITE_OTHER): Payer: Medicare HMO | Admitting: Internal Medicine

## 2023-08-01 VITALS — BP 128/72 | HR 68 | Temp 98.1°F | Ht 64.0 in | Wt 140.6 lb

## 2023-08-01 DIAGNOSIS — N1831 Chronic kidney disease, stage 3a: Secondary | ICD-10-CM | POA: Diagnosis not present

## 2023-08-01 DIAGNOSIS — R7303 Prediabetes: Secondary | ICD-10-CM

## 2023-08-01 DIAGNOSIS — I4891 Unspecified atrial fibrillation: Secondary | ICD-10-CM

## 2023-08-01 DIAGNOSIS — I10 Essential (primary) hypertension: Secondary | ICD-10-CM

## 2023-08-01 DIAGNOSIS — E782 Mixed hyperlipidemia: Secondary | ICD-10-CM | POA: Diagnosis not present

## 2023-08-01 DIAGNOSIS — R600 Localized edema: Secondary | ICD-10-CM | POA: Diagnosis not present

## 2023-08-01 DIAGNOSIS — K219 Gastro-esophageal reflux disease without esophagitis: Secondary | ICD-10-CM | POA: Diagnosis not present

## 2023-08-01 DIAGNOSIS — M810 Age-related osteoporosis without current pathological fracture: Secondary | ICD-10-CM | POA: Diagnosis not present

## 2023-08-01 LAB — COMPREHENSIVE METABOLIC PANEL
ALT: 26 U/L (ref 0–35)
AST: 29 U/L (ref 0–37)
Albumin: 4.2 g/dL (ref 3.5–5.2)
Alkaline Phosphatase: 55 U/L (ref 39–117)
BUN: 38 mg/dL — ABNORMAL HIGH (ref 6–23)
CO2: 32 meq/L (ref 19–32)
Calcium: 10.3 mg/dL (ref 8.4–10.5)
Chloride: 103 meq/L (ref 96–112)
Creatinine, Ser: 1.02 mg/dL (ref 0.40–1.20)
GFR: 50.35 mL/min — ABNORMAL LOW (ref >60.00)
Glucose, Bld: 87 mg/dL (ref 70–99)
Potassium: 4 meq/L (ref 3.5–5.1)
Sodium: 142 meq/L (ref 135–145)
Total Bilirubin: 0.7 mg/dL (ref 0.2–1.2)
Total Protein: 6.9 g/dL (ref 6.0–8.3)

## 2023-08-01 LAB — LIPID PANEL
Cholesterol: 144 mg/dL (ref 0–200)
HDL: 84.8 mg/dL (ref >39.00)
LDL Cholesterol: 43 mg/dL (ref 0–99)
NonHDL: 59.04
Total CHOL/HDL Ratio: 2
Triglycerides: 80 mg/dL (ref 0.0–149.0)
VLDL: 16 mg/dL (ref 0.0–40.0)

## 2023-08-01 LAB — HEMOGLOBIN A1C: Hgb A1c MFr Bld: 5.8 % (ref 4.6–6.5)

## 2023-08-01 LAB — CBC WITH DIFFERENTIAL/PLATELET
Basophils Absolute: 0 10*3/uL (ref 0.0–0.1)
Basophils Relative: 0.7 % (ref 0.0–3.0)
Eosinophils Absolute: 0.1 10*3/uL (ref 0.0–0.7)
Eosinophils Relative: 1.1 % (ref 0.0–5.0)
HCT: 43.1 % (ref 36.0–46.0)
Hemoglobin: 14.3 g/dL (ref 12.0–15.0)
Lymphocytes Relative: 18.9 % (ref 12.0–46.0)
Lymphs Abs: 1.1 10*3/uL (ref 0.7–4.0)
MCHC: 33.1 g/dL (ref 30.0–36.0)
MCV: 91.7 fl (ref 78.0–100.0)
Monocytes Absolute: 0.6 10*3/uL (ref 0.1–1.0)
Monocytes Relative: 9.4 % (ref 3.0–12.0)
Neutro Abs: 4.1 10*3/uL (ref 1.4–7.7)
Neutrophils Relative %: 69.9 % (ref 43.0–77.0)
Platelets: 202 10*3/uL (ref 150.0–400.0)
RBC: 4.7 Mil/uL (ref 3.87–5.11)
RDW: 13.1 % (ref 11.5–15.5)
WBC: 5.9 10*3/uL (ref 4.0–10.5)

## 2023-08-01 LAB — VITAMIN D 25 HYDROXY (VIT D DEFICIENCY, FRACTURES): VITD: 44.42 ng/mL (ref 30.00–100.00)

## 2023-08-01 NOTE — Assessment & Plan Note (Signed)
Chronic Secondary to venous insufficiency from varicose veins Continue furosemide 40 mg daily Continue wearing compression socks, elevating when able She is active during the day we will continue to walk around inside the house

## 2023-08-01 NOTE — Assessment & Plan Note (Signed)
Chronic GERD controlled Continue omeprazole 20 mg daily  

## 2023-08-01 NOTE — Assessment & Plan Note (Signed)
Chronic CMP, CBC 

## 2023-08-01 NOTE — Assessment & Plan Note (Signed)
Chronic Follows with cardiology On Eliquis 5 mg twice daily, metoprolol 25 mg in bed cmp,cbc

## 2023-08-01 NOTE — Assessment & Plan Note (Signed)
Chronic Check a1c Low sugar / carb diet Encouraged as much activity as possible  

## 2023-08-01 NOTE — Assessment & Plan Note (Signed)
Chronic Blood pressure well controlled CMP Continue losartan 50 mg daily, metoprolol 25 mg bid

## 2023-08-01 NOTE — Assessment & Plan Note (Signed)
Chronic Continue Prolia every 6 months here in the office Last Prolia injection in April 2024-next injection due next month and this is scheduled Continue calcium and vitamin D supplementation Check vitamin D level

## 2023-08-01 NOTE — Assessment & Plan Note (Signed)
Chronic Regular exercise and healthy diet encouraged Check lipid panel  Continue Crestor 10 mg daily 

## 2023-08-24 ENCOUNTER — Ambulatory Visit: Payer: Medicare HMO | Admitting: Podiatry

## 2023-08-24 ENCOUNTER — Encounter: Payer: Self-pay | Admitting: Podiatry

## 2023-08-24 DIAGNOSIS — M79674 Pain in right toe(s): Secondary | ICD-10-CM

## 2023-08-24 DIAGNOSIS — M79675 Pain in left toe(s): Secondary | ICD-10-CM | POA: Diagnosis not present

## 2023-08-24 DIAGNOSIS — B351 Tinea unguium: Secondary | ICD-10-CM | POA: Diagnosis not present

## 2023-08-24 DIAGNOSIS — D689 Coagulation defect, unspecified: Secondary | ICD-10-CM | POA: Diagnosis not present

## 2023-08-24 DIAGNOSIS — L84 Corns and callosities: Secondary | ICD-10-CM

## 2023-08-24 NOTE — Progress Notes (Signed)
Subjective:  Patient ID: Valerie Rollins, female    DOB: 03-22-1936,   MRN: [id number]  Chief Complaint  Patient presents with   Nail Problem    RFC.    87 y.o. female presents for concern of thickened elongated and painful nails that are difficult to trim. Also relates some callus area on the ball of her right foot. Requesting to have them trimmed today. Patient is on eliquis  PCP:  Pincus Sanes, MD    . Denies any other pedal complaints. Denies n/v/f/c.   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 s/p LIMA-LAD   Diverticulitis 2002   based on CT scan   Diverticulosis    Dr Marina Goodell   DVT (deep venous thrombosis) (HCC) 2000   no trigger   Endometriosis    Gastritis 2006   @ Endo   GERD (gastroesophageal reflux disease)    Hiatal hernia    Hyperlipidemia    Hypertension    IBS (irritable bowel syndrome)    Ocular hypertension    glaucoma suspect, Dr. Dagoberto Ligas   Skin cancer    Superficial phlebitis     X 2, 1999, 2000   Syncope 11/12/11   in context CAP . Carotid Doppler exam was negative. 2D echocardiogram revealed mild dilation of the left atrium and moderate increase in the systolic pressureof  the pulmonary artery   UTI (lower urinary tract infection) 01/2013   Strep bovis ; PCN sensitive    Objective:  Physical Exam: Vascular: DP/PT pulses 2/4 bilateral. CFT <3 seconds. Absent hair growth on digits. Edema noted to bilateral lower extremities. Xerosis noted bilaterally.  Skin. No lacerations or abrasions bilateral feet. Nails 1-5 bilateral  are thickened discolored and elongated with subungual debris. Hyperkeratotic lesion noted sub first metatarsal on the right foot.   Musculoskeletal: MMT 5/5 bilateral lower  extremities in DF, PF, Inversion and Eversion. Deceased ROM in DF of ankle joint.  Neurological: Sensation intact to light touch. Protective intact diminished bilateral.    Assessment:   1. Pain due to onychomycosis of toenails of both feet   2. Coagulation defect (HCC)   3. Callus      Plan:  Patient was evaluated and treated and all questions answered. -Mechanically debrided all nails 1-5 bilateral using sterile nail nipper and filed with dremel without incident  -Hyperkeartotic lesion debrided without incident with chisel.  -Answered all patient questions -Patient to return  in 3 months for at risk foot care -Patient advised to call the office if any problems or questions arise in the meantime.   Louann Sjogren, DPM

## 2023-08-25 ENCOUNTER — Encounter: Payer: Self-pay | Admitting: Internal Medicine

## 2023-09-01 ENCOUNTER — Telehealth: Payer: Self-pay

## 2023-09-01 NOTE — Telephone Encounter (Signed)
Patient is cleared for PROLIA injection on 09/05/23 per PA information on 02/04/2023. Mentioned in provider note last on 08/01/2023 Medication is [facility] supplied        02/04/23  2:03 PM Note Pt ready for scheduling for Prolia on or after : 02/04/23   Out-of-pocket cost due at time of visit: $327   Primary: [employer] Prolia co-insurance: 20% Admin fee co-insurance: 20%   Secondary: --- Prolia co-insurance:  Admin fee co-insurance:    Medical Benefit Details: Date Benefits were checked: 02/04/23 Deductible: NO/ Coinsurance: 20%/ Admin Fee: 20%   Prior Auth: APPROVED PA# [id number]  Expiration Date: 02/11/22-02/11/23   Pharmacy benefit: Copay $100 If patient wants fill through the pharmacy benefit please send prescription to: [employer], and include estimated need by date in rx notes. Pharmacy will ship medication directly to the office.   Patient NOT eligible for Prolia Copay Card. Copay Card can make patient's cost as little as $25. Link to apply: [email or web address]   ** This summary of benefits is an estimation of the patient's out-of-pocket cost. Exact cost may very based on individual plan coverage.      Joline Salt T, CPhT      02/04/23  2:00 PM Note Pt ready for scheduling on or after 02/04/23   Out-of-pocket cost due at time of visit: $327   Primary: [employer] Medicare Adv Prolia co-insurance: 20% (approximately $302) Admin fee co-insurance: 20% (approximately $25)   Secondary: n/a Prolia co-insurance:  Admin fee co-insurance:    Deductible: does not apply   Prior Auth: APPROVED PA# [id number] Valid: 02/11/22-02/11/23   ** This summary of benefits is an estimation of the patient's out-of-pocket cost. Exact cost may vary based on individual plan coverage.       Joline Salt T, CPhT      02/04/23  1:43 PM Note

## 2023-09-05 ENCOUNTER — Ambulatory Visit: Payer: Medicare HMO

## 2023-09-05 DIAGNOSIS — M810 Age-related osteoporosis without current pathological fracture: Secondary | ICD-10-CM | POA: Diagnosis not present

## 2023-09-05 MED ORDER — DENOSUMAB 60 MG/ML ~~LOC~~ SOSY
60.0000 mg | PREFILLED_SYRINGE | Freq: Once | SUBCUTANEOUS | Status: AC
Start: 2023-09-05 — End: 2023-09-05
  Administered 2023-09-05: 60 mg via SUBCUTANEOUS

## 2023-09-05 NOTE — Progress Notes (Signed)
After obtaining consent, and per orders of Dr. Lawerance Bach, injection of Prolia given by Ferdie Ping. Patient instructed to report any adverse reaction to me immediately.

## 2023-09-19 ENCOUNTER — Other Ambulatory Visit (HOSPITAL_BASED_OUTPATIENT_CLINIC_OR_DEPARTMENT_OTHER): Payer: Self-pay

## 2023-09-19 MED ORDER — FLUAD 0.5 ML IM SUSY
PREFILLED_SYRINGE | INTRAMUSCULAR | 0 refills | Status: DC
  Filled 2023-09-19: qty 0.5, 1d supply, fill #0

## 2023-09-19 MED ORDER — COMIRNATY 30 MCG/0.3ML IM SUSY
PREFILLED_SYRINGE | INTRAMUSCULAR | 0 refills | Status: DC
  Filled 2023-09-19: qty 0.3, 1d supply, fill #0

## 2023-09-22 ENCOUNTER — Encounter: Payer: Self-pay | Admitting: Cardiology

## 2023-09-27 MED ORDER — APIXABAN 5 MG PO TABS: 5.0000 mg | ORAL_TABLET | Freq: Two times a day (BID) | ORAL | Status: DC

## 2023-10-04 DIAGNOSIS — H52203 Unspecified astigmatism, bilateral: Secondary | ICD-10-CM | POA: Diagnosis not present

## 2023-10-04 DIAGNOSIS — H40013 Open angle with borderline findings, low risk, bilateral: Secondary | ICD-10-CM | POA: Diagnosis not present

## 2023-10-04 DIAGNOSIS — Z961 Presence of intraocular lens: Secondary | ICD-10-CM | POA: Diagnosis not present

## 2023-10-27 ENCOUNTER — Telehealth: Payer: Self-pay | Admitting: Internal Medicine

## 2023-10-27 NOTE — Telephone Encounter (Signed)
Inbound call from patient's daughter requesting a call to discuss an rescheduling 01/10/24 appointment with Dr. Marina Goodell. Stated patient does not wish to be seen with a APP and requesting a call to discuss further. Please advise, thank you.

## 2023-10-28 NOTE — Telephone Encounter (Signed)
Spoke with pts daughter and pt had an appt with Dr. Marina Goodell in Feb that was cancelled. She is trying to reschedule appt for her mother. Pt scheduled to see Dr. Marina Goodell 12/14/23 at 11:40am. Pts daughter aware of appt.

## 2023-11-02 ENCOUNTER — Encounter: Payer: Self-pay | Admitting: Internal Medicine

## 2023-11-04 NOTE — Telephone Encounter (Signed)
Meds were picked up.

## 2023-11-07 DIAGNOSIS — M51362 Other intervertebral disc degeneration, lumbar region with discogenic back pain and lower extremity pain: Secondary | ICD-10-CM | POA: Diagnosis not present

## 2023-11-07 DIAGNOSIS — Z79891 Long term (current) use of opiate analgesic: Secondary | ICD-10-CM | POA: Diagnosis not present

## 2023-11-07 DIAGNOSIS — M5030 Other cervical disc degeneration, unspecified cervical region: Secondary | ICD-10-CM | POA: Diagnosis not present

## 2023-11-10 ENCOUNTER — Other Ambulatory Visit: Payer: Self-pay | Admitting: Internal Medicine

## 2023-11-14 DIAGNOSIS — Z1231 Encounter for screening mammogram for malignant neoplasm of breast: Secondary | ICD-10-CM | POA: Diagnosis not present

## 2023-11-14 LAB — HM MAMMOGRAPHY

## 2023-11-14 NOTE — Progress Notes (Signed)
HPI: FU CAD and atrial myxoma resection. Echo 4/16 orderded by primary care for cardiomegaly; this revealed normal LV function, left atrial mass; mild MR. Cardiac cath 5/16 showed 80 LAD. Preop carotid dopplers 5/16 with no significant stenosis. Patient had CABG with LIMA to LAD, resection of atrial myxoma and LAA oversewn. Patient had recurrent atrial fibrillation in September 2016. She has been treated with rate controlling medications and anticoagulation. Holter 4/18 showed atrial fibrillation, rate mildly decreased late PM and early AM but otherwise controlled. Echocardiogram October 2023 showed normal LV function, mild left ventricular hypertrophy, mild biatrial enlargement, mild mitral regurgitation, moderate tricuspid regurgitation, dilated ascending aorta at 40 mm.  Since last seen, she has mild dyspnea on exertion.  No orthopnea, PND chest pain, palpitations or syncope.  No bleeding.  Current Outpatient Medications  Medication Sig Dispense Refill   albuterol (VENTOLIN HFA) 108 (90 Base) MCG/ACT inhaler INHALE 2 PUFFS BY MOUTH EVERY 6 HOURS AS NEEDED FOR WHEEZE OR SHORTNESS OF BREATH 6.7 each 5   apixaban (ELIQUIS) 5 MG TABS tablet Take 1 tablet (5 mg total) by mouth 2 (two) times daily.     Ascorbic Acid (VITAMIN C) 100 MG tablet Take 100 mg by mouth daily.     augmented betamethasone dipropionate (DIPROLENE-AF) 0.05 % cream Apply topically as directed.     calcium citrate (CALCITRATE - DOSED IN MG ELEMENTAL CALCIUM) 950 (200 Ca) MG tablet Take 200 mg of elemental calcium by mouth 2 (two) times daily.     Cholecalciferol (VITAMIN D3) 1000 UNITS CAPS Take 1,000 Units by mouth daily.      Coenzyme Q10 (CO Q 10 PO) Take by mouth daily.     COMIRNATY syringe      COVID-19 mRNA vaccine, Pfizer, (COMIRNATY) syringe Inject into the muscle. 0.3 mL 0   denosumab (PROLIA) 60 MG/ML SOSY injection Inject 60 mg into the skin every 6 (six) months.     dicyclomine (BENTYL) 20 MG tablet TAKE 1 TABLET  (20 MG TOTAL) BY MOUTH EVERY 6 (SIX) HOURS AS NEEDED FOR PAIN 360 tablet 1   fexofenadine (ALLEGRA) 180 MG tablet Take 1 tablet (180 mg total) by mouth daily. 30 tablet 11   furosemide (LASIX) 40 MG tablet TAKE 1 TABLET BY MOUTH DAILY. TAKE AN EXTRA 1/2 TABLET DAILY AS NEEDED 135 tablet 1   influenza vaccine adjuvanted (FLUAD) 0.5 ML injection Inject into the muscle. 0.5 mL 0   Lactobacillus (PROBIOTIC ACIDOPHILUS PO) Take 1 capsule by mouth daily.     losartan (COZAAR) 50 MG tablet TAKE 1 TABLET BY MOUTH EVERY DAY 90 tablet 3   metoprolol tartrate (LOPRESSOR) 25 MG tablet Take 1 tablet (25 mg total) by mouth 2 (two) times daily. 180 tablet 3   omeprazole (PRILOSEC) 20 MG capsule TAKE 1 CAPSULE BY MOUTH EVERY DAY 90 capsule 3   Polyethyl Glycol-Propyl Glycol (SYSTANE OP) Apply 1 drop to eye in the morning and at bedtime. 1 drop per eye twice daily.     rosuvastatin (CRESTOR) 10 MG tablet TAKE 1 TABLET BY MOUTH EVERY DAY 90 tablet 3   traMADol (ULTRAM) 50 MG tablet Take 1 tablet (50 mg total) by mouth every 6 (six) hours as needed. Dr.Ramos (Patient taking differently: Take 50 mg by mouth every 6 (six) hours as needed. Dr.Ramos; pt takes 1 tablet every night, maybe 1 during the day as needed) 30 tablet 0   amoxicillin (AMOXIL) 500 MG capsule Take 500 mg by mouth daily. 4  tablets prior to dental appointments.     No current facility-administered medications for this visit.     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 s/p LIMA-LAD   Diverticulitis 2002   based on CT scan   Diverticulosis    Dr Marina Goodell   DVT (deep venous thrombosis) (HCC) 2000   no trigger   Endometriosis    Gastritis 2006   @ Endo   GERD (gastroesophageal reflux disease)     Hiatal hernia    Hyperlipidemia    Hypertension    IBS (irritable bowel syndrome)    Ocular hypertension    glaucoma suspect, Dr. Dagoberto Ligas   Skin cancer    Superficial phlebitis     X 2, 1999, 2000   Syncope 11/12/11   in context CAP . Carotid Doppler exam was negative. 2D echocardiogram revealed mild dilation of the left atrium and moderate increase in the systolic pressureof  the pulmonary artery   UTI (lower urinary tract infection) 01/2013   Strep bovis ; PCN sensitive    Past Surgical History:  Procedure Laterality Date   CARDIAC CATHETERIZATION N/A 03/28/2015   Procedure: Left Heart Cath and Coronary Angiography;  Surgeon: Dolores Patty, MD;  [facility];  Service: Cardiovascular;  Laterality: N/A;   CATARACT EXTRACTION Bilateral    CATARACT EXTRACTION W/ INTRAOCULAR LENS IMPLANT     OS; Dr Dagoberto Ligas   COLONOSCOPY  1995, 2002, 2012   diverticulosis; Dr Marina Goodell   CORONARY ARTERY BYPASS GRAFT N/A 04/01/2015   Procedure: CORONARY ARTERY BYPASS GRAFTING (CABG), ON PUMP, TIMES ONE, USING LEFT INTERNAL MAMMARY ARTERY;  Surgeon: Kerin Perna, MD;  Location: [facility] OR;  Service: Open Heart Surgery;  Laterality: N/A;   EXCISION OF ATRIAL MYXOMA N/A 04/01/2015   Procedure: EXCISION OF ATRIAL MYXOMA;  Surgeon: Kerin Perna, MD;  [facility];  Service: Open Heart Surgery;  Laterality: N/A;   ORIF ANKLE FRACTURE  11/14/2011   Procedure: OPEN REDUCTION INTERNAL FIXATION (ORIF) ANKLE FRACTURE;  Surgeon: Sherri Rad;  Location: [facility];  Service: Orthopedics;  Laterality: Left;  open reduction internal fixation trimalleolar ankle fracture   TEE WITHOUT CARDIOVERSION N/A 04/01/2015   Procedure: TRANSESOPHAGEAL ECHOCARDIOGRAM (TEE);  Surgeon: Kerin Perna, MD;  Location: [facility] OR;  Service: Open Heart Surgery;  Laterality: N/A;   TONSILLECTOMY     TOTAL ABDOMINAL HYSTERECTOMY W/ BILATERAL SALPINGOOPHORECTOMY  1980   dysfunctional menses, endometriosis    VARICOSE VEIN SURGERY     1960, 1965, 2009    Social History   Socioeconomic History   Marital status: Married    Spouse name: Chrissie Noa Biomedical scientist)   Number of children: 2   Years of education: Not on file   Highest education level: Not on file  Occupational History   Occupation: Retired    Associate Professor: RETIRED  Tobacco Use   Smoking status: Never   Smokeless tobacco: Never  Vaping Use   Vaping status: Never Used  Substance and Sexual Activity   Alcohol use: No   Drug use: No   Sexual activity: Not on file  Other Topics Concern   Not on file  Social History Narrative   REG EXERCISE  HAD COLONOSCOPY AND ENDOSCOPY DONE DATES UNKNOWN   Social Drivers of Health   Financial Resource Strain: Low Risk  (06/27/2023)   Overall Financial Resource Strain (CARDIA)    Difficulty of Paying Living Expenses: Not hard at all  Food Insecurity: No Food Insecurity (06/27/2023)   Hunger Vital Sign    Worried About Running Out of Food in the Last Year: Never true    Ran Out of Food in the Last Year: Never true  Transportation Needs: No Transportation Needs (06/27/2023)   PRAPARE - Administrator, Civil Service (Medical): No    Lack of Transportation (Non-Medical): No  Physical Activity: Inactive (06/27/2023)   Exercise Vital Sign    Days of Exercise per Week: 0 days    Minutes of Exercise per Session: 0 min  Stress: No Stress Concern Present (06/27/2023)   [employer] of Occupational Health - Occupational Stress Questionnaire    Feeling of Stress : Only a little  Social Connections: Unknown (06/27/2023)   Social Connection and Isolation Panel [NHANES]    Frequency of Communication with Friends and Family: More than three times a week    Frequency of Social Gatherings with Friends and Family: Once a week    Attends Religious Services: Patient declined    Database administrator or Organizations: No    Attends Banker Meetings: Never    Marital Status: Married  Careers information officer Violence: Not At Risk (06/27/2023)   Humiliation, Afraid, Rape, and Kick questionnaire    Fear of Current or Ex-Partner: No    Emotionally Abused: No    Physically Abused: No    Sexually Abused: No    Family History  Problem Relation Age of Onset   Alzheimer's disease Mother 20       TIAs   Heart attack Father 67   Hyperlipidemia Brother    Hypertension Brother    Hypertension Brother    Prostate cancer Brother 67   Colon cancer Maternal Grandmother    Heart attack Paternal Grandmother 29   Diverticulitis Daughter        S/P colectomy   Diabetes Neg Hx    Stroke Neg Hx    Hypercalcemia Neg Hx    Stomach cancer Neg Hx    Pancreatic cancer Neg Hx    Esophageal cancer Neg Hx     ROS: no fevers or chills, productive cough, hemoptysis, dysphasia, odynophagia, melena, hematochezia, dysuria, hematuria, rash, seizure activity, orthopnea, PND, claudication. Remaining systems are negative.  Physical Exam: Well-developed well-nourished in no acute distress.  Skin is warm and dry.  HEENT is normal.  Neck is supple.  Chest is clear to auscultation with normal expansion.  Cardiovascular exam is irregular, 2/6 systolic murmur left sternal border.  No diastolic murmur noted. Abdominal exam nontender or distended. No masses palpated. Extremities show trace edema. neuro grossly intact  EKG Interpretation Date/Time:  Friday November 18 2023 09:48:15 EST Ventricular Rate:  48 PR Interval:    QRS Duration:  144 QT Interval:  492 QTC Calculation: 439 R Axis:   50  Text Interpretation: Sinus with PACs Right bundle branch block When compared with ECG of 30-Jul-2015 15:19, Wide QRS rhythm has replaced Atrial fibrillation Vent. rate has decreased BY  50 BPM Confirmed by Olga Millers ([id number]) on 11/18/2023 9:49:26 AM     A/P  1 paroxysmal atrial fibrillation/flutter-patient is in sinus rhythm today.  Continue beta-blocker for rate control.  Continue apixaban.  2 history of  atrial myxoma resection-no evidence of recurrence on most recent echocardiogram.  3 coronary artery disease-patient denies chest pain.  Continue statin.  4 hypertension-blood pressure controlled.  Continue present medical regimen.  5 hyperlipidemia-continue statin.  6 history of tricuspid regurgitation-moderate on most recent echocardiogram.  Will need follow-up studies in the future.  7 peripheral edema-continue diuretic.  Olga Millers, MD

## 2023-11-15 ENCOUNTER — Encounter: Payer: Self-pay | Admitting: Internal Medicine

## 2023-11-18 ENCOUNTER — Encounter: Payer: Self-pay | Admitting: Cardiology

## 2023-11-18 ENCOUNTER — Ambulatory Visit: Payer: Medicare HMO | Admitting: Cardiology

## 2023-11-18 ENCOUNTER — Ambulatory Visit: Payer: Medicare HMO | Attending: Cardiology | Admitting: Cardiology

## 2023-11-18 VITALS — BP 150/70 | HR 53 | Ht 64.0 in | Wt 144.0 lb

## 2023-11-18 DIAGNOSIS — I10 Essential (primary) hypertension: Secondary | ICD-10-CM | POA: Diagnosis not present

## 2023-11-18 DIAGNOSIS — E7800 Pure hypercholesterolemia, unspecified: Secondary | ICD-10-CM

## 2023-11-18 DIAGNOSIS — I2510 Atherosclerotic heart disease of native coronary artery without angina pectoris: Secondary | ICD-10-CM

## 2023-11-18 DIAGNOSIS — I4821 Permanent atrial fibrillation: Secondary | ICD-10-CM

## 2023-11-18 DIAGNOSIS — I079 Rheumatic tricuspid valve disease, unspecified: Secondary | ICD-10-CM | POA: Diagnosis not present

## 2023-11-18 NOTE — Patient Instructions (Signed)

## 2023-11-28 ENCOUNTER — Encounter: Payer: Self-pay | Admitting: Podiatry

## 2023-11-28 ENCOUNTER — Ambulatory Visit: Payer: PPO | Admitting: Podiatry

## 2023-11-28 DIAGNOSIS — B351 Tinea unguium: Secondary | ICD-10-CM | POA: Diagnosis not present

## 2023-11-28 DIAGNOSIS — D689 Coagulation defect, unspecified: Secondary | ICD-10-CM

## 2023-11-28 DIAGNOSIS — L84 Corns and callosities: Secondary | ICD-10-CM

## 2023-11-28 DIAGNOSIS — M79674 Pain in right toe(s): Secondary | ICD-10-CM | POA: Diagnosis not present

## 2023-11-28 DIAGNOSIS — M79675 Pain in left toe(s): Secondary | ICD-10-CM

## 2023-11-28 NOTE — Progress Notes (Signed)
  Subjective:  Patient ID: Valerie Rollins, female    DOB: 1936-04-15,   MRN: [id number]  No chief complaint on file.   88 y.o. female presents for concern of thickened elongated and painful nails that are difficult to trim. Also relates some callus area on the ball of her right foot. Requesting to have them trimmed today. Patient is on eliquis   PCP:  Geofm Glade PARAS, MD    . Denies any other pedal complaints. Denies n/v/f/c.   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 s/p LIMA-LAD   Diverticulitis 2002   based on CT scan   Diverticulosis    Dr Abran   DVT (deep venous thrombosis) (HCC) 2000   no trigger   Endometriosis    Gastritis 2006   @ Endo   GERD (gastroesophageal reflux disease)    Hiatal hernia    Hyperlipidemia    Hypertension    IBS (irritable bowel syndrome)    Ocular hypertension    glaucoma suspect, Dr. Rosan   Skin cancer    Superficial phlebitis     X 2, 1999, 2000   Syncope 11/12/11   in context CAP . Carotid Doppler exam was negative. 2D echocardiogram revealed mild dilation of the left atrium and moderate increase in the systolic pressureof  the pulmonary artery   UTI (lower urinary tract infection) 01/2013   Strep bovis ; PCN sensitive    Objective:  Physical Exam: Vascular: DP/PT pulses 2/4 bilateral. CFT <3 seconds. Absent hair growth on digits. Edema noted to bilateral lower extremities. Xerosis noted bilaterally.  Skin. No lacerations or abrasions bilateral feet. Nails 1-5 bilateral  are thickened discolored and elongated with subungual debris. Hyperkeratotic lesion noted sub first metatarsal on the right foot.  Musculoskeletal: MMT 5/5 bilateral lower extremities in DF, PF, Inversion and Eversion.  Deceased ROM in DF of ankle joint.  Neurological: Sensation intact to light touch. Protective intact diminished bilateral.    Assessment:   1. Pain due to onychomycosis of toenails of both feet   2. Coagulation defect [facility])      Plan:  Patient was evaluated and treated and all questions answered. -Mechanically debrided all nails 1-5 bilateral using sterile nail nipper and filed with dremel without incident  -Hyperkeartotic lesion debrided without incident with chisel.  -Answered all patient questions -Patient to return  in 3 months for at risk foot care -Patient advised to call the office if any problems or questions arise in the meantime.   Asberry Failing, DPM

## 2023-12-01 ENCOUNTER — Encounter: Payer: Self-pay | Admitting: Orthopedic Surgery

## 2023-12-05 ENCOUNTER — Other Ambulatory Visit: Payer: Self-pay | Admitting: Internal Medicine

## 2023-12-06 ENCOUNTER — Encounter: Payer: Self-pay | Admitting: Orthopedic Surgery

## 2023-12-06 ENCOUNTER — Ambulatory Visit: Payer: PPO | Admitting: Orthopedic Surgery

## 2023-12-06 DIAGNOSIS — I87331 Chronic venous hypertension (idiopathic) with ulcer and inflammation of right lower extremity: Secondary | ICD-10-CM

## 2023-12-06 NOTE — Progress Notes (Signed)
 Office Visit Note   Patient: Valerie Rollins           Date of Birth: 18-May-1936           MRN: [id number] Visit Date: 12/06/2023              Requested by: Geofm Glade PARAS, MD [address] [place],  [place] [address] PCP: Geofm Glade PARAS, MD  Chief Complaint  Patient presents with   Right Leg - Wound Check      HPI: Patient is an 88 year old woman who is seen for initial evaluation for new venous ulcer right lower extremity.  Patient states she has been having increasing swelling in both legs despite using compression socks.  Patient states she sleeps in a reclining chair with her legs dependent.  Assessment & Plan: Visit Diagnoses:  1. Chronic venous hypertension (idiopathic) with ulcer and inflammation of right lower extremity (HCC)     Plan: Dynaflex wrap applied to the right lower extremity recommended improved elevation with her foot above her heart.  Patient will need to advance to extra-large compression socks once we have some swelling under control.  Follow-Up Instructions: Return in about 1 week (around 12/13/2023).   Ortho Exam  Patient is alert, oriented, no adenopathy, well-dressed, normal affect, normal respiratory effort. Examination there is no redness no cellulitis no drainage.  Patient has significant venous swelling of the right lower extremity.  The calf measures 42 cm in circumference.  There is a new venous ulcer with 100% granulation tissue that measures 10 x 15 mm.  Imaging: No results found. No images are attached to the encounter.  Labs: Lab Results  Component Value Date   HGBA1C 5.8 08/01/2023   HGBA1C 5.9 02/14/2023   HGBA1C 6.0 08/16/2022   LABURIC 5.6 09/02/2010   REPTSTATUS 11/16/2011 FINAL 11/15/2011   CULT NO GROWTH 11/15/2011   LABORGA STREPTOCOCCUS GROUP D;high probability for S.bovis 02/15/2013     Lab Results  Component Value Date   ALBUMIN  4.2 08/01/2023   ALBUMIN  4.5 02/14/2023   ALBUMIN  4.7 08/16/2022    Lab Results   Component Value Date   MG 2.0 04/02/2015   MG 2.2 04/02/2015   MG 2.8 (H) 04/01/2015   Lab Results  Component Value Date   VD25OH 44.42 08/01/2023   VD25OH 39.35 02/14/2023   VD25OH 41.01 08/16/2022    No results found for: PREALBUMIN    Latest Ref Rng & Units 08/01/2023    2:54 PM 02/14/2023    2:13 PM 08/16/2022    1:56 PM  CBC EXTENDED  WBC 4.0 - 10.5 K/uL 5.9  6.1  5.7   RBC 3.87 - 5.11 Mil/uL 4.70  4.90  4.84   Hemoglobin 12.0 - 15.0 g/dL 85.6  85.2  85.2   HCT 36.0 - 46.0 % 43.1  43.4  43.1   Platelets 150.0 - 400.0 K/uL 202.0  203.0  193.0   NEUT# 1.4 - 7.7 K/uL 4.1  4.3  3.8   Lymph# 0.7 - 4.0 K/uL 1.1  1.2  1.3      There is no height or weight on file to calculate BMI.  Orders:  No orders of the defined types were placed in this encounter.  No orders of the defined types were placed in this encounter.    Procedures: No procedures performed  Clinical Data: No additional findings.  ROS:  All other systems negative, except as noted in the HPI. Review of Systems  Objective: Vital  Signs: There were no vitals taken for this visit.  Specialty Comments:  No specialty comments available.  PMFS History: Patient Active Problem List   Diagnosis Date Noted   Infected ulcer of skin (HCC) 12/01/2022   Fall 12/01/2022   Ecchymosis 12/01/2022   Thickened nails 02/02/2021   Long-term current use of opiate analgesic 10/11/2020   Degenerative spondylolisthesis 10/11/2020   Chronic venous stasis dermatitis of both lower extremities 01/21/2020   Hypercalcemia 07/19/2019   CKD (chronic kidney disease) stage 3, GFR 30-59 ml/min 07/19/2019   Chronic low back pain 05/07/2019   Candidal intertrigo 04/20/2018   Angular stomatitis 04/20/2018   Seasonal allergic rhinitis due to pollen 03/21/2018   Bilateral leg edema 12/31/2016   Prediabetes 01/10/2016   Osteoporosis - prolia  Q 6 months 12/26/2015   Mass of neck 07/07/2015   Long term current use of anticoagulant  therapy 04/16/2015   S/P CABG x 1 04/01/2015   CAD (coronary artery disease) 03/28/2015   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 s/p LIMA-LAD   Diverticulitis 2002   based on CT scan   Diverticulosis    Dr Abran   DVT (deep venous thrombosis) (HCC) 2000   no trigger   Endometriosis    Gastritis 2006   @ Endo   GERD (gastroesophageal reflux disease)    Hiatal hernia    Hyperlipidemia    Hypertension    IBS (irritable bowel syndrome)    Ocular hypertension    glaucoma suspect, Dr. Rosan   Skin cancer    Superficial phlebitis     X 2, 1999, 2000   Syncope 11/12/11   in context CAP . Carotid Doppler exam was negative. 2D echocardiogram revealed mild dilation of the left atrium and moderate increase in the systolic pressureof  the pulmonary artery   UTI (lower urinary tract infection) 01/2013   Strep bovis ; PCN sensitive    Family History  Problem Relation Age of Onset   Alzheimer's disease Mother 57       TIAs   Heart attack Father 50   Hyperlipidemia Brother    Hypertension Brother    Hypertension Brother    Prostate cancer Brother 3   Colon cancer Maternal Grandmother     Heart attack Paternal Grandmother 79   Diverticulitis Daughter        S/P colectomy   Diabetes Neg Hx    Stroke Neg Hx    Hypercalcemia Neg Hx    Stomach cancer Neg Hx    Pancreatic cancer Neg Hx    Esophageal cancer Neg Hx     Past Surgical History:  Procedure Laterality Date   CARDIAC CATHETERIZATION N/A 03/28/2015  Procedure: Left Heart Cath and Coronary Angiography;  Surgeon: Toribio JONELLE Fuel, MD;  [facility]: [facility];  Service: Cardiovascular;  Laterality: N/A;   CATARACT EXTRACTION Bilateral    CATARACT EXTRACTION W/ INTRAOCULAR LENS IMPLANT     OS; Dr Rosan   COLONOSCOPY  1995, 2002, 2012   diverticulosis; Dr Abran   CORONARY ARTERY BYPASS GRAFT N/A 04/01/2015   Procedure: CORONARY ARTERY BYPASS GRAFTING (CABG), ON PUMP, TIMES ONE, USING LEFT INTERNAL MAMMARY ARTERY;  Surgeon: Maude Fleeta Ochoa, MD;  Location: [facility] OR;  Service: Open Heart Surgery;  Laterality: N/A;   EXCISION OF ATRIAL MYXOMA N/A 04/01/2015   Procedure: EXCISION OF ATRIAL MYXOMA;  Surgeon: Maude Fleeta Ochoa, MD;  Location: [facility] OR;  Service: Open Heart Surgery;  Laterality: N/A;   ORIF ANKLE FRACTURE  11/14/2011   Procedure: OPEN REDUCTION INTERNAL FIXATION (ORIF) ANKLE FRACTURE;  Surgeon: Deward DELENA Schwartz;  Location: [facility];  Service: Orthopedics;  Laterality: Left;  open reduction internal fixation trimalleolar ankle fracture   TEE WITHOUT CARDIOVERSION N/A 04/01/2015   Procedure: TRANSESOPHAGEAL ECHOCARDIOGRAM (TEE);  Surgeon: Maude Fleeta Ochoa, MD;  Location: [facility] OR;  Service: Open Heart Surgery;  Laterality: N/A;   TONSILLECTOMY     TOTAL ABDOMINAL HYSTERECTOMY W/ BILATERAL SALPINGOOPHORECTOMY  1980   dysfunctional menses, endometriosis   VARICOSE VEIN SURGERY     1960, 1965, 2009   Social History   Occupational History   Occupation: Retired    Associate Professor: RETIRED  Tobacco Use   Smoking status: Never   Smokeless tobacco: Never  Vaping Use   Vaping status: Never Used  Substance and Sexual  Activity   Alcohol use: No   Drug use: No   Sexual activity: Not on file

## 2023-12-07 ENCOUNTER — Encounter: Payer: Self-pay | Admitting: Orthopedic Surgery

## 2023-12-13 ENCOUNTER — Ambulatory Visit (INDEPENDENT_AMBULATORY_CARE_PROVIDER_SITE_OTHER): Payer: PPO | Admitting: Orthopedic Surgery

## 2023-12-13 DIAGNOSIS — L97221 Non-pressure chronic ulcer of left calf limited to breakdown of skin: Secondary | ICD-10-CM

## 2023-12-13 DIAGNOSIS — I83022 Varicose veins of left lower extremity with ulcer of calf: Secondary | ICD-10-CM

## 2023-12-13 DIAGNOSIS — I87331 Chronic venous hypertension (idiopathic) with ulcer and inflammation of right lower extremity: Secondary | ICD-10-CM

## 2023-12-14 ENCOUNTER — Encounter: Payer: Self-pay | Admitting: Orthopedic Surgery

## 2023-12-14 ENCOUNTER — Encounter: Payer: Self-pay | Admitting: Internal Medicine

## 2023-12-14 ENCOUNTER — Ambulatory Visit: Payer: PPO | Admitting: Internal Medicine

## 2023-12-14 VITALS — BP 134/70 | HR 53 | Ht 64.0 in | Wt 144.0 lb

## 2023-12-14 DIAGNOSIS — K219 Gastro-esophageal reflux disease without esophagitis: Secondary | ICD-10-CM

## 2023-12-14 DIAGNOSIS — K589 Irritable bowel syndrome without diarrhea: Secondary | ICD-10-CM

## 2023-12-14 MED ORDER — OMEPRAZOLE 20 MG PO CPDR: 20.0000 mg | DELAYED_RELEASE_CAPSULE | Freq: Every day | ORAL | 3 refills | Status: AC

## 2023-12-14 MED ORDER — DICYCLOMINE HCL 20 MG PO TABS: 20.0000 mg | ORAL_TABLET | Freq: Four times a day (QID) | ORAL | 3 refills | Status: DC | PRN

## 2023-12-14 NOTE — Patient Instructions (Signed)
We have sent the following medications to your pharmacy for you to pick up at your convenience:  Omeprazole, Dicyclomine.  Please follow up in 2 years.  _______________________________________________________  If your blood pressure at your visit was 140/90 or greater, please contact your primary care physician to follow up on this.  _______________________________________________________  If you are age 86 or older, your body mass index should be between 23-30. Your Body mass index is 24.72 kg/m. If this is out of the aforementioned range listed, please consider follow up with your Primary Care Provider.  If you are age 64 or younger, your body mass index should be between 19-25. Your Body mass index is 24.72 kg/m. If this is out of the aformentioned range listed, please consider follow up with your Primary Care Provider.   ________________________________________________________  The [facility] providers would like to encourage you to use [facility] to communicate with providers for non-urgent requests or questions.  Due to long hold times on the telephone, sending your provider a message by [facility] may be a faster and more efficient way to get a response.  Please allow 48 business hours for a response.  Please remember that this is for non-urgent requests.  _______________________________________________________

## 2023-12-14 NOTE — Progress Notes (Signed)
HISTORY OF PRESENT ILLNESS:  Valerie Rollins is a 88 y.o. female with GERD and IBS who presents today for follow-up regarding the same.  She is accompanied by her daughter.  She was last evaluated in the office December 08, 2021.  See education.  For her chronic GERD she continues on omeprazole 20 mg daily.  On medication good control of reflux symptoms.  No dysphagia.  Last upper endoscopy 2006.  Next, she continues to manage her IBS with on-demand Bentyl.  This helps.  She would like her prescription refilled.  Review of blood work from August 01, 2023 shows unremarkable comprehensive metabolic panel with normal liver test.  Normal CBC with hemoglobin 14.3.  Last colonoscopy 2012 was negative for neoplasia.  She does have pandiverticulosis.  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 s/p LIMA-LAD   Diverticulitis 2002   based on CT scan   Diverticulosis    Dr Marina Goodell   DVT (deep venous thrombosis) (HCC) 2000   no trigger   Endometriosis    Gastritis 2006   @ Endo   GERD (gastroesophageal reflux disease)    Hiatal hernia    Hyperlipidemia    Hypertension    IBS (irritable bowel syndrome)    Ocular hypertension    glaucoma suspect, Dr. Dagoberto Ligas   Skin cancer    Superficial phlebitis     X 2, 1999, 2000   Syncope 11/12/11   in context CAP . Carotid Doppler exam was negative. 2D echocardiogram revealed mild dilation of the left atrium and moderate increase in the systolic pressureof  the pulmonary artery   UTI (lower  urinary tract infection) 01/2013   Strep bovis ; PCN sensitive    Past Surgical History:  Procedure Laterality Date   CARDIAC CATHETERIZATION N/A 03/28/2015   Procedure: Left Heart Cath and Coronary Angiography;  Surgeon: Dolores Patty, MD;  [facility];  Service: Cardiovascular;  Laterality: N/A;   CATARACT EXTRACTION Bilateral    CATARACT EXTRACTION W/ INTRAOCULAR LENS IMPLANT     OS; Dr Dagoberto Ligas   COLONOSCOPY  1995, 2002, 2012   diverticulosis; Dr Marina Goodell   CORONARY ARTERY BYPASS GRAFT N/A 04/01/2015   Procedure: CORONARY ARTERY BYPASS GRAFTING (CABG), ON PUMP, TIMES ONE, USING LEFT INTERNAL MAMMARY ARTERY;  Surgeon: Kerin Perna, MD;  Location: [facility] OR;  Service: Open Heart Surgery;  Laterality: N/A;   EXCISION OF ATRIAL MYXOMA N/A 04/01/2015   Procedure: EXCISION OF ATRIAL MYXOMA;  Surgeon: Kerin Perna, MD;  Location: [facility] OR;  Service: Open Heart Surgery;  Laterality: N/A;   ORIF ANKLE FRACTURE  11/14/2011   Procedure: OPEN REDUCTION INTERNAL FIXATION (ORIF) ANKLE FRACTURE;  Surgeon: Sherri Rad;  Location: [facility];  Service: Orthopedics;  Laterality: Left;  open reduction internal fixation trimalleolar ankle fracture   TEE WITHOUT CARDIOVERSION N/A 04/01/2015   Procedure: TRANSESOPHAGEAL ECHOCARDIOGRAM (TEE);  Surgeon: Kerin Perna, MD;  Location[facility] OR;  Service: Open Heart Surgery;  Laterality: N/A;  TONSILLECTOMY     TOTAL ABDOMINAL HYSTERECTOMY W/ BILATERAL SALPINGOOPHORECTOMY  1980   dysfunctional menses, endometriosis   VARICOSE VEIN SURGERY     1960, 1965, 2009    Social History VENNESSA CHREST  reports that she has never smoked. She has never used smokeless tobacco. She reports that she does not drink alcohol and does not use drugs.  family history includes Alzheimer's disease (age of onset: 60) in her mother; Colon cancer in her maternal grandmother; Diverticulitis in her daughter; Heart attack (age of onset: 56) in her father; Heart attack  (age of onset: 20) in her paternal grandmother; Hyperlipidemia in her brother; Hypertension in her brother and brother; Prostate cancer (age of onset: 44) in her brother.  Allergies  Allergen Reactions   Oxybutynin Chloride     Tongue swelling; Rx from Dr Dannette Barbara   Propoxyphene N-Acetaminophen     REACTION: made pt blood to thin   Alendronate Sodium     ? reaction   Ramipril     REACTION: cough       PHYSICAL EXAMINATION: Vital signs: BP 134/70   Pulse (!) 53   Ht 5\' 4"  (1.626 m)   Wt 144 lb (65.3 kg)   BMI 24.72 kg/m   Constitutional: generally well-appearing, no acute distress Psychiatric: alert and oriented x3, cooperative Eyes: extraocular movements intact, anicteric, conjunctiva pink Mouth: oral pharynx moist, no lesions Neck: supple no lymphadenopathy Cardiovascular: heart regular rate and rhythm, no murmur Lungs: clear to auscultation bilaterally Abdomen: soft, nontender, nondistended, no obvious ascites, no peritoneal signs, normal bowel sounds, no organomegaly Rectal: Omitted Extremities: no clubbing, cyanosis, or lower extremity edema bilaterally.  Arthritic changes in hands Skin: no lesions on visible extremities Neuro: No focal deficits.   ASSESSMENT:  1.  GERD.  Symptoms controlled with daily omeprazole. 2.  IBS.  Symptoms controlled with Bentyl   PLAN:  1.  Reflux precautions 2.  Refill omeprazole.  Medication list reviewed 3.  Refill Bentyl.  Medication list reviewed 4.  Ongoing general medical care PCP 5.  Routine GI follow-up 2 years.  Contact the office in the interim for any questions or problems

## 2023-12-14 NOTE — Progress Notes (Signed)
Office Visit Note   Patient: Valerie Rollins           Date of Birth: 1936/06/19           MRN: [id number] Visit Date: 12/13/2023              Requested by: Pincus Sanes, MD [address] [place],  [place] [address] PCP: Pincus Sanes, MD  Chief Complaint  Patient presents with   Right Leg - Wound Check      HPI: Patient is an 88 year old woman who presents in follow-up for venous insufficiency ulcer right lower extremity.  Assessment & Plan: Visit Diagnoses:  1. Chronic venous hypertension (idiopathic) with ulcer and inflammation of right lower extremity (HCC)   2. Venous stasis ulcer of left calf limited to breakdown of skin with varicose veins (HCC)     Plan: Continue with the Dynaflex compression wrap.  Reevaluate in 1 week.  Will transition to compression socks when the wound is stable.  Follow-Up Instructions: No follow-ups on file.   Ortho Exam  Patient is alert, oriented, no adenopathy, well-dressed, normal affect, normal respiratory effort. Examination patient has decreased swelling the skin is wrinkling.  The wound is flat with healthy granulation tissue.  It measures 10 x 25 mm.  There is no drainage no cellulitis.  Imaging: No results found.   Labs: Lab Results  Component Value Date   HGBA1C 5.8 08/01/2023   HGBA1C 5.9 02/14/2023   HGBA1C 6.0 08/16/2022   LABURIC 5.6 09/02/2010   REPTSTATUS 11/16/2011 FINAL 11/15/2011   CULT NO GROWTH 11/15/2011   LABORGA STREPTOCOCCUS GROUP D;high probability for S.bovis 02/15/2013     Lab Results  Component Value Date   ALBUMIN 4.2 08/01/2023   ALBUMIN 4.5 02/14/2023   ALBUMIN 4.7 08/16/2022    Lab Results  Component Value Date   MG 2.0 04/02/2015   MG 2.2 04/02/2015   MG 2.8 (H) 04/01/2015   Lab Results  Component Value Date   VD25OH 44.42 08/01/2023   VD25OH 39.35 02/14/2023   VD25OH 41.01 08/16/2022    No results found for: "PREALBUMIN"    Latest Ref Rng & Units 08/01/2023    2:54 PM  02/14/2023    2:13 PM 08/16/2022    1:56 PM  CBC EXTENDED  WBC 4.0 - 10.5 K/uL 5.9  6.1  5.7   RBC 3.87 - 5.11 Mil/uL 4.70  4.90  4.84   Hemoglobin 12.0 - 15.0 g/dL 19.1  47.8  29.5   HCT 36.0 - 46.0 % 43.1  43.4  43.1   Platelets 150.0 - 400.0 K/uL 202.0  203.0  193.0   NEUT# 1.4 - 7.7 K/uL 4.1  4.3  3.8   Lymph# 0.7 - 4.0 K/uL 1.1  1.2  1.3      There is no height or weight on file to calculate BMI.  Orders:  No orders of the defined types were placed in this encounter.  No orders of the defined types were placed in this encounter.    Procedures: No procedures performed  Clinical Data: No additional findings.  ROS:  All other systems negative, except as noted in the HPI. Review of Systems  Objective: Vital Signs: There were no vitals taken for this visit.  Specialty Comments:  No specialty comments available.  PMFS History: Patient Active Problem List   Diagnosis Date Noted   Infected ulcer of skin (HCC) 12/01/2022   Fall 12/01/2022   Ecchymosis 12/01/2022  Thickened nails 02/02/2021   Long-term current use of opiate analgesic 10/11/2020   Degenerative spondylolisthesis 10/11/2020   Chronic venous stasis dermatitis of both lower extremities 01/21/2020   Hypercalcemia 07/19/2019   CKD (chronic kidney disease) stage 3, GFR 30-59 ml/min 07/19/2019   Chronic low back pain 05/07/2019   Candidal intertrigo 04/20/2018   Angular stomatitis 04/20/2018   Seasonal allergic rhinitis due to pollen 03/21/2018   Bilateral leg edema 12/31/2016   Prediabetes 01/10/2016   Osteoporosis - prolia Q 6 months 12/26/2015   Mass of neck 07/07/2015   Long term current use of anticoagulant therapy 04/16/2015   S/P CABG x 1 04/01/2015   CAD (coronary artery disease) 03/28/2015   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 s/p LIMA-LAD   Diverticulitis 2002   based on CT scan   Diverticulosis    Dr Marina Goodell   DVT (deep venous thrombosis) (HCC) 2000   no trigger   Endometriosis    Gastritis 2006   @ Endo   GERD (gastroesophageal reflux disease)    Hiatal hernia    Hyperlipidemia    Hypertension    IBS (irritable bowel syndrome)    Ocular hypertension    glaucoma suspect, Dr. Dagoberto Ligas   Skin cancer    Superficial phlebitis     X 2, 1999, 2000   Syncope 11/12/11   in context CAP . Carotid Doppler exam was negative. 2D echocardiogram revealed mild dilation of the left atrium and moderate increase in the systolic pressureof  the pulmonary artery   UTI (lower urinary tract infection) 01/2013   Strep bovis ; PCN sensitive    Family History  Problem Relation Age of Onset   Alzheimer's disease Mother 42       TIAs   Heart attack Father 30   Hyperlipidemia Brother    Hypertension Brother    Hypertension Brother    Prostate cancer Brother 23   Colon cancer Maternal Grandmother    Heart attack Paternal Grandmother 32   Diverticulitis Daughter        S/P colectomy   Diabetes Neg Hx    Stroke Neg Hx    Hypercalcemia Neg Hx    Stomach cancer Neg Hx    Pancreatic cancer Neg Hx    Esophageal cancer Neg Hx     Past Surgical History:  Procedure Laterality Date    CARDIAC CATHETERIZATION N/A 03/28/2015   Procedure: Left Heart Cath and Coronary Angiography;  Surgeon: Dolores Patty, MD;  Location: [facility];  Service: Cardiovascular;  Laterality: N/A;   CATARACT EXTRACTION Bilateral    CATARACT EXTRACTION W/ INTRAOCULAR LENS IMPLANT     OS; Dr Dagoberto Ligas  COLONOSCOPY  1995, 2002, 2012   diverticulosis; Dr Marina Goodell   CORONARY ARTERY BYPASS GRAFT N/A 04/01/2015   Procedure: CORONARY ARTERY BYPASS GRAFTING (CABG), ON PUMP, TIMES ONE, USING LEFT INTERNAL MAMMARY ARTERY;  Surgeon: Kerin Perna, MD;  Location: [facility] OR;  Service: Open Heart Surgery;  Laterality: N/A;   EXCISION OF ATRIAL MYXOMA N/A 04/01/2015   Procedure: EXCISION OF ATRIAL MYXOMA;  Surgeon: Kerin Perna, MD;  Location: [facility] OR;  Service: Open Heart Surgery;  Laterality: N/A;   ORIF ANKLE FRACTURE  11/14/2011   Procedure: OPEN REDUCTION INTERNAL FIXATION (ORIF) ANKLE FRACTURE;  Surgeon: Sherri Rad;  Location: [facility];  Service: Orthopedics;  Laterality: Left;  open reduction internal fixation trimalleolar ankle fracture   TEE WITHOUT CARDIOVERSION N/A 04/01/2015   Procedure: TRANSESOPHAGEAL ECHOCARDIOGRAM (TEE);  Surgeon: Kerin Perna, MD;  Location: [facility] OR;  Service: Open Heart Surgery;  Laterality: N/A;   TONSILLECTOMY     TOTAL ABDOMINAL HYSTERECTOMY W/ BILATERAL SALPINGOOPHORECTOMY  1980   dysfunctional menses, endometriosis   VARICOSE VEIN SURGERY     1960, 1965, 2009   Social History   Occupational History   Occupation: Retired    Associate Professor: RETIRED  Tobacco Use   Smoking status: Never   Smokeless tobacco: Never  Vaping Use   Vaping status: Never Used  Substance and Sexual Activity   Alcohol use: No   Drug use: No   Sexual activity: Not on file

## 2023-12-19 ENCOUNTER — Ambulatory Visit: Payer: PPO | Admitting: Orthopedic Surgery

## 2023-12-19 ENCOUNTER — Encounter: Payer: Self-pay | Admitting: Orthopedic Surgery

## 2023-12-19 DIAGNOSIS — I87331 Chronic venous hypertension (idiopathic) with ulcer and inflammation of right lower extremity: Secondary | ICD-10-CM | POA: Diagnosis not present

## 2023-12-19 NOTE — Progress Notes (Signed)
Office Visit Note   Patient: Valerie Rollins           Date of Birth: 1936-01-07           MRN: [id number] Visit Date: 12/19/2023              Requested by: Pincus Sanes, MD [address] [place],  [place] [address] PCP: Pincus Sanes, MD  Chief Complaint  Patient presents with   Right Leg - Wound Check      HPI: Patient is an 88 year old woman who presents in follow-up for venous ulcer right lower extremity.  Patient states she has decreased swelling she feels well has no concerns.  Patient states she is walking about 30 minutes a day.  Assessment & Plan: Visit Diagnoses:  1. Chronic venous hypertension (idiopathic) with ulcer and inflammation of right lower extremity (HCC)     Plan: Recommended that she resume using her knee-high compression socks.  She may resume the extra-large and advance to large socks as she feels comfortable.  Continue to increase her walking and continue with elevation.  Follow-Up Instructions: Return if symptoms worsen or fail to improve.   Ortho Exam  Patient is alert, oriented, no adenopathy, well-dressed, normal affect, normal respiratory effort. Examination of the right calf ulcer is well-healed there is wrinkling of the skin the dermatitis has improved.  There is no cellulitis.  The calf is 37 cm in circumference.  Imaging: No results found. No images are attached to the encounter.  Labs: Lab Results  Component Value Date   HGBA1C 5.8 08/01/2023   HGBA1C 5.9 02/14/2023   HGBA1C 6.0 08/16/2022   LABURIC 5.6 09/02/2010   REPTSTATUS 11/16/2011 FINAL 11/15/2011   CULT NO GROWTH 11/15/2011   LABORGA STREPTOCOCCUS GROUP D;high probability for S.bovis 02/15/2013     Lab Results  Component Value Date   ALBUMIN 4.2 08/01/2023   ALBUMIN 4.5 02/14/2023   ALBUMIN 4.7 08/16/2022    Lab Results  Component Value Date   MG 2.0 04/02/2015   MG 2.2 04/02/2015   MG 2.8 (H) 04/01/2015   Lab Results  Component Value Date   VD25OH 44.42  08/01/2023   VD25OH 39.35 02/14/2023   VD25OH 41.01 08/16/2022    No results found for: "PREALBUMIN"    Latest Ref Rng & Units 08/01/2023    2:54 PM 02/14/2023    2:13 PM 08/16/2022    1:56 PM  CBC EXTENDED  WBC 4.0 - 10.5 K/uL 5.9  6.1  5.7   RBC 3.87 - 5.11 Mil/uL 4.70  4.90  4.84   Hemoglobin 12.0 - 15.0 g/dL 19.1  47.8  29.5   HCT 36.0 - 46.0 % 43.1  43.4  43.1   Platelets 150.0 - 400.0 K/uL 202.0  203.0  193.0   NEUT# 1.4 - 7.7 K/uL 4.1  4.3  3.8   Lymph# 0.7 - 4.0 K/uL 1.1  1.2  1.3      There is no height or weight on file to calculate BMI.  Orders:  No orders of the defined types were placed in this encounter.  No orders of the defined types were placed in this encounter.    Procedures: No procedures performed  Clinical Data: No additional findings.  ROS:  All other systems negative, except as noted in the HPI. Review of Systems  Objective: Vital Signs: There were no vitals taken for this visit.  Specialty Comments:  No specialty comments available.  PMFS History: Patient  Active Problem List   Diagnosis Date Noted   Infected ulcer of skin (HCC) 12/01/2022   Fall 12/01/2022   Ecchymosis 12/01/2022   Thickened nails 02/02/2021   Long-term current use of opiate analgesic 10/11/2020   Degenerative spondylolisthesis 10/11/2020   Chronic venous stasis dermatitis of both lower extremities 01/21/2020   Hypercalcemia 07/19/2019   CKD (chronic kidney disease) stage 3, GFR 30-59 ml/min 07/19/2019   Chronic low back pain 05/07/2019   Candidal intertrigo 04/20/2018   Angular stomatitis 04/20/2018   Seasonal allergic rhinitis due to pollen 03/21/2018   Bilateral leg edema 12/31/2016   Prediabetes 01/10/2016   Osteoporosis - prolia Q 6 months 12/26/2015   Mass of neck 07/07/2015   Long term current use of anticoagulant therapy 04/16/2015   S/P CABG x 1 04/01/2015   CAD (coronary artery disease) 03/28/2015   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   Cardiomegaly 02/20/2015    DDD (degenerative disc disease), lumbar 07/20/2012   Atrial fibrillation (HCC)-Dr. 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 s/p LIMA-LAD   Diverticulitis 2002   based on CT scan   Diverticulosis    Dr Marina Goodell   DVT (deep venous thrombosis) (HCC) 2000   no trigger   Endometriosis    Gastritis 2006   @ Endo   GERD (gastroesophageal reflux disease)    Hiatal hernia    Hyperlipidemia    Hypertension    IBS (irritable bowel syndrome)    Ocular hypertension    glaucoma suspect, Dr. Dagoberto Ligas   Skin cancer    Superficial phlebitis     X 2, 1999, 2000   Syncope 11/12/11   in context CAP . Carotid Doppler exam was negative. 2D echocardiogram revealed mild dilation of the left atrium and moderate increase in the systolic pressureof  the pulmonary artery   UTI (lower urinary tract infection) 01/2013   Strep bovis ; PCN sensitive    Family History  Problem Relation Age of Onset   Alzheimer's disease Mother 36       TIAs   Heart attack Father 42   Hyperlipidemia Brother    Hypertension Brother    Hypertension Brother    Prostate cancer Brother 51   Colon cancer Maternal Grandmother    Heart attack Paternal Grandmother 70   Diverticulitis Daughter        S/P colectomy   Diabetes Neg Hx    Stroke Neg Hx    Hypercalcemia Neg  Hx    Stomach cancer Neg Hx    Pancreatic cancer Neg Hx    Esophageal cancer Neg Hx     Past Surgical History:  Procedure Laterality Date   CARDIAC CATHETERIZATION N/A 03/28/2015   Procedure: Left Heart Cath and Coronary Angiography;  Surgeon: Dolores Patty, MD;  [facility];  Service: Cardiovascular;  Laterality: N/A;   CATARACT EXTRACTION Bilateral    CATARACT EXTRACTION W/ INTRAOCULAR LENS IMPLANT     OS; Dr Dagoberto Ligas   COLONOSCOPY  1995, 2002, 2012   diverticulosis; Dr Marina Goodell   CORONARY ARTERY BYPASS GRAFT N/A 04/01/2015   Procedure: CORONARY ARTERY BYPASS GRAFTING (CABG), ON PUMP, TIMES ONE, USING LEFT INTERNAL MAMMARY ARTERY;  Surgeon: Kerin Perna, MD;  Location: [facility] OR;  Service: Open Heart Surgery;  Laterality: N/A;   EXCISION OF ATRIAL MYXOMA N/A 04/01/2015   Procedure: EXCISION OF ATRIAL MYXOMA;  Surgeon: Kerin Perna, MD;  Location: [facility] OR;  Service: Open Heart Surgery;  Laterality: N/A;   ORIF ANKLE FRACTURE  11/14/2011   Procedure: OPEN REDUCTION INTERNAL FIXATION (ORIF) ANKLE FRACTURE;  Surgeon: Sherri Rad;  Location: [facility];  Service: Orthopedics;  Laterality: Left;  open reduction internal fixation trimalleolar ankle fracture   TEE WITHOUT CARDIOVERSION N/A 04/01/2015   Procedure: TRANSESOPHAGEAL ECHOCARDIOGRAM (TEE);  Surgeon: Kerin Perna, MD;  Location: [facility] OR;  Service: Open Heart Surgery;  Laterality: N/A;   TONSILLECTOMY     TOTAL ABDOMINAL HYSTERECTOMY W/ BILATERAL SALPINGOOPHORECTOMY  1980   dysfunctional menses, endometriosis   VARICOSE VEIN SURGERY     1960, 1965, 2009   Social History   Occupational History   Occupation: Retired    Associate Professor: RETIRED  Tobacco Use   Smoking status: Never   Smokeless tobacco: Never  Vaping Use   Vaping status: Never Used  Substance and Sexual Activity   Alcohol use: No   Drug use: No   Sexual activity: Not on file

## 2023-12-30 ENCOUNTER — Encounter: Payer: Self-pay | Admitting: Orthopedic Surgery

## 2024-01-05 ENCOUNTER — Other Ambulatory Visit: Payer: Self-pay | Admitting: Cardiology

## 2024-01-05 DIAGNOSIS — I079 Rheumatic tricuspid valve disease, unspecified: Secondary | ICD-10-CM

## 2024-01-10 ENCOUNTER — Ambulatory Visit: Payer: Medicare HMO | Admitting: Internal Medicine

## 2024-01-13 ENCOUNTER — Other Ambulatory Visit: Payer: Self-pay

## 2024-01-13 DIAGNOSIS — M810 Age-related osteoporosis without current pathological fracture: Secondary | ICD-10-CM

## 2024-01-13 MED ORDER — DENOSUMAB 60 MG/ML ~~LOC~~ SOSY
60.0000 mg | PREFILLED_SYRINGE | Freq: Once | SUBCUTANEOUS | Status: AC
  Administered 2024-03-12: 60 mg via SUBCUTANEOUS

## 2024-01-16 ENCOUNTER — Telehealth: Payer: Self-pay

## 2024-01-16 NOTE — Telephone Encounter (Signed)
 Prolia VOB initiated via [email or web address]  Next Prolia inj DUE: 03/05/24

## 2024-01-23 ENCOUNTER — Other Ambulatory Visit (HOSPITAL_COMMUNITY): Payer: Self-pay

## 2024-01-23 NOTE — Telephone Encounter (Signed)
 Pt ready for scheduling for PROLIA on or after : 03/05/24  Option# 1: Buy/Bill (Office supplied medication)  Out-of-pocket cost due at time of clinic visit: $332  Number of injection/visits approved: ---  Primary: [employer] Prolia co-insurance: 20% Admin fee co-insurance: 0%  Secondary: --- Prolia co-insurance:  Admin fee co-insurance:   Medical Benefit Details: Date Benefits were checked: 01/23/24 Deductible: NO/ Coinsurance: 20%/ Admin Fee: 0%  Prior Auth: N/A PA# Expiration Date:   # of doses approved: ----------------------------------------------------------------------- Option# 2- Med Obtained from pharmacy:  Pharmacy benefit: Copay $250 (Paid to pharmacy) Admin Fee: 0% (Pay at clinic)  Prior Auth: N/A PA# Expiration Date:   # of doses approved:   If patient wants fill through the pharmacy benefit please send prescription to: [employer] [facility], and include estimated need by date in rx notes. Pharmacy will ship medication directly to the office.  Patient NOT eligible for Prolia Copay Card. Copay Card can make patient's cost as little as $25. Link to apply: [email or web address]://[email or web address]  ** This summary of benefits is an estimation of the patient's out-of-pocket cost. Exact cost may very based on individual plan coverage.

## 2024-01-24 ENCOUNTER — Encounter: Payer: Self-pay | Admitting: Internal Medicine

## 2024-01-29 NOTE — Progress Notes (Unsigned)
 Subjective:    Patient ID: Valerie Rollins, female    DOB: [birth year], 88 y.o.   MRN: [id number]     HPI Valerie Rollins is here for follow up of her chronic medical problems.  She is here today with her daughter.  We not have any concerns. No falls in the last year.    Medications and allergies reviewed with patient and updated if appropriate.  Current Outpatient Medications on File Prior to Visit  Medication Sig Dispense Refill   albuterol (VENTOLIN HFA) 108 (90 Base) MCG/ACT inhaler INHALE 2 PUFFS BY MOUTH EVERY 6 HOURS AS NEEDED FOR WHEEZE OR SHORTNESS OF BREATH 18 each 5   apixaban (ELIQUIS) 5 MG TABS tablet Take 1 tablet (5 mg total) by mouth 2 (two) times daily.     Ascorbic Acid (VITAMIN C) 100 MG tablet Take 100 mg by mouth daily.     augmented betamethasone dipropionate (DIPROLENE-AF) 0.05 % cream Apply topically as directed.     calcium citrate (CALCITRATE - DOSED IN MG ELEMENTAL CALCIUM) 950 (200 Ca) MG tablet Take 200 mg of elemental calcium by mouth 2 (two) times daily.     Cholecalciferol (VITAMIN D3) 1000 UNITS CAPS Take 1,000 Units by mouth daily.      Coenzyme Q10 (CO Q 10 PO) Take by mouth daily.     denosumab (PROLIA) 60 MG/ML SOSY injection Inject 60 mg into the skin every 6 (six) months.     dicyclomine (BENTYL) 20 MG tablet Take 1 tablet (20 mg total) by mouth every 6 (six) hours as needed. for pain 270 tablet 3   fexofenadine (ALLEGRA) 180 MG tablet Take 1 tablet (180 mg total) by mouth daily. 30 tablet 11   furosemide (LASIX) 40 MG tablet TAKE 1 TABLET BY MOUTH DAILY. TAKE AN EXTRA 1/2 TABLET DAILY AS NEEDED 135 tablet 1   Lactobacillus (PROBIOTIC ACIDOPHILUS PO) Take 1 capsule by mouth daily.     losartan (COZAAR) 50 MG tablet TAKE 1 TABLET BY MOUTH EVERY DAY 90 tablet 3   metoprolol tartrate (LOPRESSOR) 25 MG tablet TAKE 2 TABLETS EVERY MORNING AND 1 TABLET IN THE EVENING 270 tablet 3   omeprazole (PRILOSEC) 20 MG capsule Take 1 capsule (20 mg total) by mouth  daily. 90 capsule 3   Polyethyl Glycol-Propyl Glycol (SYSTANE OP) Apply 1 drop to eye in the morning and at bedtime. 1 drop per eye twice daily.     rosuvastatin (CRESTOR) 10 MG tablet TAKE 1 TABLET BY MOUTH EVERY DAY 90 tablet 3   traMADol (ULTRAM) 50 MG tablet Take 1 tablet (50 mg total) by mouth every 6 (six) hours as needed. Dr.Ramos (Patient taking differently: Take 50 mg by mouth every 6 (six) hours as needed. Dr.Ramos; pt takes 1 tablet every night, maybe 1 during the day as needed) 30 tablet 0   amoxicillin (AMOXIL) 500 MG capsule Take 500 mg by mouth daily. 4 tablets prior to dental appointments. (Patient not taking: Reported on 01/30/2024)     Current Facility-Administered Medications on File Prior to Visit  Medication Dose Route Frequency Provider Last Rate Last Admin   [START ON 03/05/2024] denosumab (PROLIA) injection 60 mg  60 mg Subcutaneous Once Pincus Sanes, MD         Review of Systems  Constitutional:  Negative for appetite change and fever.  Respiratory:  Negative for cough, shortness of breath and wheezing.   Cardiovascular:  Positive for leg swelling (chronic - ? stable). Negative  for chest pain and palpitations.  Musculoskeletal:  Positive for arthralgias.  Neurological:  Positive for light-headedness (occ). Negative for headaches.  Psychiatric/Behavioral:  Negative for sleep disturbance.        Objective:   Vitals:   01/30/24 1401 01/30/24 1451  BP: (!) 148/76 132/70  Pulse: (!) 50   Temp: 97.9 F (36.6 C)   SpO2: 98%    BP Readings from Last 3 Encounters:  01/30/24 132/70  12/14/23 134/70  11/18/23 (!) 150/70   Wt Readings from Last 3 Encounters:  01/30/24 149 lb (67.6 kg)  12/14/23 144 lb (65.3 kg)  11/18/23 144 lb (65.3 kg)   Body mass index is 25.58 kg/m.    Physical Exam Constitutional:      General: She is not in acute distress.    Appearance: Normal appearance.  HENT:     Head: Normocephalic and atraumatic.  Eyes:      Conjunctiva/sclera: Conjunctivae normal.  Cardiovascular:     Rate and Rhythm: Normal rate and regular rhythm.     Heart sounds: Murmur (3/6 sys) heard.  Pulmonary:     Effort: Pulmonary effort is normal. No respiratory distress.     Breath sounds: Normal breath sounds. No wheezing.  Musculoskeletal:     Cervical back: Neck supple.     Right lower leg: Edema present.     Left lower leg: Edema present.  Lymphadenopathy:     Cervical: No cervical adenopathy.  Skin:    General: Skin is warm and dry.     Findings: No rash.  Neurological:     Mental Status: She is alert. Mental status is at baseline.  Psychiatric:        Mood and Affect: Mood normal.        Behavior: Behavior normal.        Lab Results  Component Value Date   WBC 5.9 08/01/2023   HGB 14.3 08/01/2023   HCT 43.1 08/01/2023   PLT 202.0 08/01/2023   GLUCOSE 87 08/01/2023   CHOL 144 08/01/2023   TRIG 80.0 08/01/2023   HDL 84.80 08/01/2023   LDLDIRECT 152.6 07/31/2013   LDLCALC 43 08/01/2023   ALT 26 08/01/2023   AST 29 08/01/2023   NA 142 08/01/2023   K 4.0 08/01/2023   CL 103 08/01/2023   CREATININE 1.02 08/01/2023   BUN 38 (H) 08/01/2023   CO2 32 08/01/2023   TSH 2.33 02/14/2023   INR 1.4 07/31/2015   HGBA1C 5.8 08/01/2023     Assessment & Plan:    See Problem List for Assessment and Plan of chronic medical problems.

## 2024-01-29 NOTE — Patient Instructions (Addendum)
      Blood work was ordered.       Medications changes include :   None    A referral was ordered and someone will call you to schedule an appointment.     Return in about 6 months (around 08/01/2024) for follow up.

## 2024-01-30 ENCOUNTER — Encounter: Payer: Self-pay | Admitting: Internal Medicine

## 2024-01-30 ENCOUNTER — Ambulatory Visit (INDEPENDENT_AMBULATORY_CARE_PROVIDER_SITE_OTHER): Payer: Medicare HMO | Admitting: Internal Medicine

## 2024-01-30 VITALS — BP 132/70 | HR 50 | Temp 97.9°F | Ht 64.0 in | Wt 149.0 lb

## 2024-01-30 DIAGNOSIS — R7303 Prediabetes: Secondary | ICD-10-CM | POA: Diagnosis not present

## 2024-01-30 DIAGNOSIS — N1831 Chronic kidney disease, stage 3a: Secondary | ICD-10-CM

## 2024-01-30 DIAGNOSIS — E782 Mixed hyperlipidemia: Secondary | ICD-10-CM | POA: Diagnosis not present

## 2024-01-30 DIAGNOSIS — I10 Essential (primary) hypertension: Secondary | ICD-10-CM | POA: Diagnosis not present

## 2024-01-30 DIAGNOSIS — R600 Localized edema: Secondary | ICD-10-CM | POA: Diagnosis not present

## 2024-01-30 DIAGNOSIS — I4891 Unspecified atrial fibrillation: Secondary | ICD-10-CM

## 2024-01-30 DIAGNOSIS — K219 Gastro-esophageal reflux disease without esophagitis: Secondary | ICD-10-CM

## 2024-01-30 DIAGNOSIS — M810 Age-related osteoporosis without current pathological fracture: Secondary | ICD-10-CM

## 2024-01-30 LAB — COMPREHENSIVE METABOLIC PANEL
ALT: 40 U/L — ABNORMAL HIGH (ref 0–35)
AST: 40 U/L — ABNORMAL HIGH (ref 0–37)
Albumin: 4.4 g/dL (ref 3.5–5.2)
Alkaline Phosphatase: 74 U/L (ref 39–117)
BUN: 37 mg/dL — ABNORMAL HIGH (ref 6–23)
CO2: 30 meq/L (ref 19–32)
Calcium: 10.6 mg/dL — ABNORMAL HIGH (ref 8.4–10.5)
Chloride: 104 meq/L (ref 96–112)
Creatinine, Ser: 0.95 mg/dL (ref 0.40–1.20)
GFR: 54.64 mL/min — ABNORMAL LOW (ref >60.00)
Glucose, Bld: 101 mg/dL — ABNORMAL HIGH (ref 70–99)
Potassium: 4.6 meq/L (ref 3.5–5.1)
Sodium: 143 meq/L (ref 135–145)
Total Bilirubin: 0.8 mg/dL (ref 0.2–1.2)
Total Protein: 6.9 g/dL (ref 6.0–8.3)

## 2024-01-30 LAB — CBC WITH DIFFERENTIAL/PLATELET
Basophils Absolute: 0 10*3/uL (ref 0.0–0.1)
Basophils Relative: 0.6 % (ref 0.0–3.0)
Eosinophils Absolute: 0.1 10*3/uL (ref 0.0–0.7)
Eosinophils Relative: 1.9 % (ref 0.0–5.0)
HCT: 41.8 % (ref 36.0–46.0)
Hemoglobin: 14 g/dL (ref 12.0–15.0)
Lymphocytes Relative: 15.1 % (ref 12.0–46.0)
Lymphs Abs: 0.9 10*3/uL (ref 0.7–4.0)
MCHC: 33.6 g/dL (ref 30.0–36.0)
MCV: 92.5 fl (ref 78.0–100.0)
Monocytes Absolute: 0.6 10*3/uL (ref 0.1–1.0)
Monocytes Relative: 9.6 % (ref 3.0–12.0)
Neutro Abs: 4.5 10*3/uL (ref 1.4–7.7)
Neutrophils Relative %: 72.8 % (ref 43.0–77.0)
Platelets: 187 10*3/uL (ref 150.0–400.0)
RBC: 4.53 Mil/uL (ref 3.87–5.11)
RDW: 13.4 % (ref 11.5–15.5)
WBC: 6.2 10*3/uL (ref 4.0–10.5)

## 2024-01-30 LAB — LIPID PANEL
Cholesterol: 146 mg/dL (ref 0–200)
HDL: 85.7 mg/dL (ref >39.00)
LDL Cholesterol: 48 mg/dL (ref 0–99)
NonHDL: 60.41
Total CHOL/HDL Ratio: 2
Triglycerides: 60 mg/dL (ref 0.0–149.0)
VLDL: 12 mg/dL (ref 0.0–40.0)

## 2024-01-30 LAB — VITAMIN D 25 HYDROXY (VIT D DEFICIENCY, FRACTURES): VITD: 37.52 ng/mL (ref 30.00–100.00)

## 2024-01-30 LAB — HEMOGLOBIN A1C: Hgb A1c MFr Bld: 5.9 % (ref 4.6–6.5)

## 2024-01-30 NOTE — Assessment & Plan Note (Signed)
 Chronic Paroxysmal-in sinus rhythm here today Follows with cardiology On Eliquis 5 mg twice daily, metoprolol 50 mg in the morning and 25 mg in evening  cmp,cbc

## 2024-01-30 NOTE — Assessment & Plan Note (Signed)
Chronic CMP, CBC 

## 2024-01-30 NOTE — Assessment & Plan Note (Signed)
Chronic Regular exercise and healthy diet encouraged Check lipid panel  Continue Crestor 10 mg daily 

## 2024-01-30 NOTE — Assessment & Plan Note (Signed)
 Chronic Continue Prolia every 6 months here in the office next injection due next month and this is scheduled Continue calcium and vitamin D supplementation Check vitamin D level

## 2024-01-30 NOTE — Assessment & Plan Note (Signed)
Chronic Check a1c Low sugar / carb diet Encouraged as much activity as possible  

## 2024-01-30 NOTE — Assessment & Plan Note (Signed)
Chronic GERD controlled Continue omeprazole 20 mg daily  

## 2024-01-30 NOTE — Assessment & Plan Note (Signed)
 Chronic Blood pressure well controlled CMP, CBC Continue losartan 50 mg daily, metoprolol 50 mg in the morning, 25 mg in the evening

## 2024-01-30 NOTE — Assessment & Plan Note (Addendum)
 Chronic Secondary to venous insufficiency from varicose veins Stable Continue furosemide 40 mg daily Continue wearing compression socks, elevating when able She is active during the day we will continue to walk around inside the house

## 2024-01-31 ENCOUNTER — Encounter: Payer: Self-pay | Admitting: Internal Medicine

## 2024-02-01 ENCOUNTER — Encounter: Payer: Self-pay | Admitting: Internal Medicine

## 2024-02-27 ENCOUNTER — Ambulatory Visit: Payer: Medicare HMO | Admitting: Podiatry

## 2024-02-27 ENCOUNTER — Encounter: Payer: Self-pay | Admitting: Podiatry

## 2024-02-27 DIAGNOSIS — B351 Tinea unguium: Secondary | ICD-10-CM

## 2024-02-27 DIAGNOSIS — M79675 Pain in left toe(s): Secondary | ICD-10-CM | POA: Diagnosis not present

## 2024-02-27 DIAGNOSIS — L84 Corns and callosities: Secondary | ICD-10-CM

## 2024-02-27 DIAGNOSIS — D689 Coagulation defect, unspecified: Secondary | ICD-10-CM

## 2024-02-27 DIAGNOSIS — M79674 Pain in right toe(s): Secondary | ICD-10-CM

## 2024-02-27 NOTE — Progress Notes (Signed)
  Subjective:  Patient ID: Valerie Rollins, female    DOB: [birth year],   MRN: [id number]  No chief complaint on file.   88 y.o. female presents for concern of thickened elongated and painful nails that are difficult to trim. Also relates some callus area on the ball of her right foot. Requesting to have them trimmed today. Patient is on eliquis  PCP:  Pincus Sanes, MD    . Denies any other pedal complaints. Denies n/v/f/c.   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 s/p LIMA-LAD   Diverticulitis 2002   based on CT scan   Diverticulosis    Dr Marina Goodell   DVT (deep venous thrombosis) (HCC) 2000   no trigger   Endometriosis    Gastritis 2006   @ Endo   GERD (gastroesophageal reflux disease)    Hiatal hernia    Hyperlipidemia    Hypertension    IBS (irritable bowel syndrome)    Ocular hypertension    glaucoma suspect, Dr. Dagoberto Ligas   Skin cancer    Superficial phlebitis     X 2, 1999, 2000   Syncope 11/12/11   in context CAP . Carotid Doppler exam was negative. 2D echocardiogram revealed mild dilation of the left atrium and moderate increase in the systolic pressureof  the pulmonary artery   UTI (lower urinary tract infection) 01/2013   Strep bovis ; PCN sensitive    Objective:  Physical Exam: Vascular: DP/PT pulses 2/4 bilateral. CFT <3 seconds. Absent hair growth on digits. Edema noted to bilateral lower extremities. Xerosis noted bilaterally.  Skin. No lacerations or abrasions bilateral feet. Nails 1-5 bilateral  are thickened discolored and elongated with subungual debris. Hyperkeratotic lesion noted sub first metatarsal on the right foot.  Musculoskeletal: MMT 5/5 bilateral lower extremities in DF, PF, Inversion and Eversion.  Deceased ROM in DF of ankle joint.  Neurological: Sensation intact to light touch. Protective intact diminished bilateral.    Assessment:   1. Pain due to onychomycosis of toenails of both feet   2. Coagulation defect (HCC)   3. Callus       Plan:  Patient was evaluated and treated and all questions answered. -Mechanically debrided all nails 1-5 bilateral using sterile nail nipper and filed with dremel without incident  -Hyperkeartotic lesion debrided without incident with chisel.  -Answered all patient questions -Patient to return  in 3 months for at risk foot care -Patient advised to call the office if any problems or questions arise in the meantime.   Louann Sjogren, DPM

## 2024-03-08 ENCOUNTER — Other Ambulatory Visit: Payer: Self-pay | Admitting: Cardiology

## 2024-03-08 DIAGNOSIS — E7800 Pure hypercholesterolemia, unspecified: Secondary | ICD-10-CM

## 2024-03-08 DIAGNOSIS — I10 Essential (primary) hypertension: Secondary | ICD-10-CM

## 2024-03-08 NOTE — Telephone Encounter (Signed)
 Prescription refill request for Eliquis received. Indication: AF Last office visit: 11/18/23  Isobel Marie MD Scr: 0.95 on 01/30/24  Epic Age: 88 Weight: 65.3kg  Based on above findings Eliquis 5mg  twice daily is the appropriate dose.  Refill approved.

## 2024-03-12 ENCOUNTER — Ambulatory Visit

## 2024-03-12 DIAGNOSIS — M810 Age-related osteoporosis without current pathological fracture: Secondary | ICD-10-CM

## 2024-03-12 MED ORDER — DENOSUMAB 60 MG/ML ~~LOC~~ SOSY: 60.0000 mg | PREFILLED_SYRINGE | Freq: Once | SUBCUTANEOUS | Status: DC

## 2024-03-12 NOTE — Progress Notes (Signed)
 Patient visits today to receive her PROLIA  injection/vaccine. Patient was informed and tolerated well. Patient was notified to reach out to us  if needed.   Patient is cleared for PROLIA  injection on 02/2024 per PA information on 2.25/2025.  Mentioned in provider note last on 01/30/2024. Medication is CLINIC supplied. ZOXWR:$[id number]

## 2024-03-25 ENCOUNTER — Other Ambulatory Visit: Payer: Self-pay | Admitting: Internal Medicine

## 2024-03-28 ENCOUNTER — Other Ambulatory Visit (HOSPITAL_BASED_OUTPATIENT_CLINIC_OR_DEPARTMENT_OTHER): Payer: Self-pay

## 2024-04-10 ENCOUNTER — Encounter: Payer: Self-pay | Admitting: Podiatry

## 2024-04-10 ENCOUNTER — Ambulatory Visit: Admitting: Podiatry

## 2024-04-10 ENCOUNTER — Ambulatory Visit (INDEPENDENT_AMBULATORY_CARE_PROVIDER_SITE_OTHER)

## 2024-04-10 VITALS — Ht 64.0 in | Wt 149.0 lb

## 2024-04-10 DIAGNOSIS — B351 Tinea unguium: Secondary | ICD-10-CM

## 2024-04-10 DIAGNOSIS — M79675 Pain in left toe(s): Secondary | ICD-10-CM | POA: Diagnosis not present

## 2024-04-10 DIAGNOSIS — L03031 Cellulitis of right toe: Secondary | ICD-10-CM

## 2024-04-10 DIAGNOSIS — M79674 Pain in right toe(s): Secondary | ICD-10-CM | POA: Diagnosis not present

## 2024-04-10 MED ORDER — CEPHALEXIN 500 MG PO CAPS: 500.0000 mg | ORAL_CAPSULE | Freq: Three times a day (TID) | ORAL | 0 refills | Status: DC

## 2024-04-10 NOTE — Patient Instructions (Signed)

## 2024-04-11 NOTE — Progress Notes (Signed)
  Subjective:  Patient ID: Valerie Rollins, female    DOB: 04-11-1936,  MRN: [id number]  Chief Complaint  Patient presents with   Toe Pain    Pt is here due to right 2nd, 3rd and 4th toe pain, that started a week ago, states toes were sore and painful, cold barely walk, states now the toes feels a lot better.    Discussed the use of AI scribe software for clinical note transcription with the patient, who gave verbal consent to proceed.  History of Present Illness Valerie Rollins is an 88 year old female who presents with pain and swelling in the toes of her right foot.  She experiences significant pain in the second toe of her right foot, with severe discomfort when removing her sock. The pain is localized around the nail, which is red and swollen. There is no known injury to the toe, and this is a new issue for her. She usually wears compression socks due to foot swelling, which she removed for the visit. Her toenails are trimmed every three months, with the last trim in April. Her toenails grow quickly, and some are becoming loose.  She sees Dr. Sikora for routine nail trims.      Objective:    Physical Exam General: AAO x3, NAD  Dermatological: On the right second toenail there is localized edema erythema on the proximal nail fold.  The nail is long and appears to be loose distally but there is no drainage or pus or ascending cellulitis.  There is some slight edema to the toe but there is no other areas of erythema.  No increase in warmth.  Vascular: Dorsalis Pedis artery and Posterior Tibial artery pedal pulses are 2/4 bilateral with immedate capillary fill time. There is no pain with calf compression, swelling, warmth, erythema.  Varicose veins present.  Neruologic: Grossly intact via light touch bilateral.   Musculoskeletal: Tenderness along the area the erythema proximal nail fold.  Gait: Unassisted, Nonantalgic.     No images are attached to the encounter.     Results  RADIOLOGY Foot X-ray: No evidence of acute fracture noted.  Digital contractures present.  (04/10/2024)   Assessment:   1. Cellulitis of toe, right      Plan:  Patient was evaluated and treated and all questions answered.  Assessment and Plan Assessment & Plan Infection of right second toe Acute infection likely due to irritation from a long toenail. X-rays negative for fracture. Gout unlikely. - Trim toenail to reduce irritation.  This was completely any complications or bleeding. - Prescribed Keflex  for one week. - Advised antibiotic ointment application. - Provided toe cap for protection, avoid compression socks. - Recommended open-toed or extra depth shoes. - Instructed on Epsom salt soaks. - Follow-up in three weeks for nail trim and reassessment.  Varicose veins Chronic varicose veins with swelling. - Continue wearing compression socks.   Return in about 3 weeks (around 05/01/2024) for nail trim with Dr. Alvah Auerbach, toe pain right 2nd.   Charity Conch DPM

## 2024-04-24 DIAGNOSIS — Z79899 Other long term (current) drug therapy: Secondary | ICD-10-CM | POA: Diagnosis not present

## 2024-05-03 ENCOUNTER — Ambulatory Visit: Admitting: Podiatry

## 2024-05-07 ENCOUNTER — Ambulatory Visit: Admitting: Podiatry

## 2024-05-07 ENCOUNTER — Encounter: Payer: Self-pay | Admitting: Podiatry

## 2024-05-07 DIAGNOSIS — M79675 Pain in left toe(s): Secondary | ICD-10-CM

## 2024-05-07 DIAGNOSIS — D689 Coagulation defect, unspecified: Secondary | ICD-10-CM | POA: Diagnosis not present

## 2024-05-07 DIAGNOSIS — B351 Tinea unguium: Secondary | ICD-10-CM | POA: Diagnosis not present

## 2024-05-07 DIAGNOSIS — M79674 Pain in right toe(s): Secondary | ICD-10-CM | POA: Diagnosis not present

## 2024-05-07 DIAGNOSIS — L84 Corns and callosities: Secondary | ICD-10-CM

## 2024-05-07 NOTE — Progress Notes (Signed)
  Subjective:  Patient ID: Valerie Rollins, female    DOB: September 08, 1936,   MRN: [id number]  Chief Complaint  Patient presents with   Wound Check    Rm9/ Follow up infection right second toe and patient says she is doing well/ dfc    88 y.o. female presents for concern of thickened elongated and painful nails that are difficult to trim. Also relates some callus area on the ball of her right foot. Requesting to have them trimmed today. Patient is on eliquis   Was also seen by Dr. Clydia Dart recently for concern of infection in right second toe. She was on ant biotics and relates doing well.   PCP:  Colene Dauphin, MD    . Denies any other pedal complaints. Denies n/v/f/c.   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 s/p LIMA-LAD   Diverticulitis 2002   based on CT scan   Diverticulosis    Dr Elvin Hammer   DVT (deep venous thrombosis) (HCC) 2000   no trigger   Endometriosis    Gastritis 2006   @ Endo   GERD (gastroesophageal reflux disease)    Hiatal hernia    Hyperlipidemia    Hypertension    IBS (irritable bowel syndrome)    Ocular hypertension    glaucoma suspect, Dr. Matthew Songster   Skin cancer    Superficial phlebitis     X 2, 1999, 2000   Syncope 11/12/11   in context CAP . Carotid Doppler exam was negative. 2D echocardiogram revealed mild dilation of the left atrium and moderate increase in the systolic pressureof  the pulmonary artery   UTI (lower urinary tract infection) 01/2013   Strep bovis ; PCN sensitive    Objective:  Physical Exam: Vascular: DP/PT pulses 2/4 bilateral. CFT <3 seconds. Absent hair growth on digits. Edema noted to bilateral lower extremities. Xerosis noted bilaterally.  Skin. No lacerations or abrasions bilateral  feet. Nails 1-5 bilateral  are thickened discolored and elongated with subungual debris. Hyperkeratotic lesion noted sub first metatarsal on the right foot.  Musculoskeletal: MMT 5/5 bilateral lower extremities in DF, PF, Inversion and Eversion. Deceased ROM in DF of ankle joint.  Neurological: Sensation intact to light touch. Protective intact diminished bilateral.    Assessment:   1. Pain due to onychomycosis of toenails of both feet   2. Coagulation defect (HCC)   3. Callus        Plan:  Patient was evaluated and treated and all questions answered. -Mechanically debrided all nails 1-5 bilateral using sterile nail nipper and filed with dremel without incident  -Right second digit well healed.  -Answered all patient questions -Patient to return  in 3 months for at risk foot care -Patient advised to call the office if any problems or questions arise in the meantime.   Jennefer Moats, DPM

## 2024-05-08 DIAGNOSIS — M5030 Other cervical disc degeneration, unspecified cervical region: Secondary | ICD-10-CM | POA: Diagnosis not present

## 2024-05-08 DIAGNOSIS — M51362 Other intervertebral disc degeneration, lumbar region with discogenic back pain and lower extremity pain: Secondary | ICD-10-CM | POA: Diagnosis not present

## 2024-05-08 DIAGNOSIS — G8929 Other chronic pain: Secondary | ICD-10-CM | POA: Diagnosis not present

## 2024-05-28 ENCOUNTER — Ambulatory Visit: Admitting: Podiatry

## 2024-06-02 ENCOUNTER — Encounter: Payer: Self-pay | Admitting: Cardiology

## 2024-06-05 ENCOUNTER — Encounter: Payer: Self-pay | Admitting: Orthopedic Surgery

## 2024-06-11 ENCOUNTER — Encounter: Payer: Self-pay | Admitting: Cardiology

## 2024-06-25 ENCOUNTER — Encounter: Payer: Self-pay | Admitting: Podiatry

## 2024-06-25 ENCOUNTER — Ambulatory Visit: Admitting: Podiatry

## 2024-06-25 DIAGNOSIS — M79675 Pain in left toe(s): Secondary | ICD-10-CM | POA: Diagnosis not present

## 2024-06-25 DIAGNOSIS — M79674 Pain in right toe(s): Secondary | ICD-10-CM

## 2024-06-25 DIAGNOSIS — B351 Tinea unguium: Secondary | ICD-10-CM

## 2024-06-25 DIAGNOSIS — D689 Coagulation defect, unspecified: Secondary | ICD-10-CM | POA: Diagnosis not present

## 2024-06-25 NOTE — Progress Notes (Signed)
  Subjective:  Patient ID: Valerie Rollins, female    DOB: 25-Jul-1936,   MRN: [id number]  Chief Complaint  Patient presents with   Nail Problem    Trim my toenails.      88 y.o. female presents for concern of thickened elongated and painful nails that are difficult to trim. Also relates some callus area on the ball of her right foot. Requesting to have them trimmed today. Patient is on eliquis     PCP:  Geofm Glade PARAS, MD    . Denies any other pedal complaints. Denies n/v/f/c.   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 s/p LIMA-LAD   Diverticulitis 2002   based on CT scan   Diverticulosis    Dr Abran   DVT (deep venous thrombosis) (HCC) 2000   no trigger   Endometriosis    Gastritis 2006   @ Endo   GERD (gastroesophageal reflux disease)    Hiatal hernia    Hyperlipidemia    Hypertension    IBS (irritable bowel syndrome)    Ocular hypertension    glaucoma suspect, Dr. Rosan   Skin cancer    Superficial phlebitis     X 2, 1999, 2000   Syncope 11/12/11   in context CAP . Carotid Doppler exam was negative. 2D echocardiogram revealed mild dilation of the left atrium and moderate increase in the systolic pressureof  the pulmonary artery   UTI (lower urinary tract infection) 01/2013   Strep bovis ; PCN sensitive    Objective:  Physical Exam: Vascular: DP/PT pulses 2/4 bilateral. CFT <3 seconds. Absent hair growth on digits. Edema noted to bilateral lower extremities. Xerosis noted bilaterally.  Skin. No lacerations or abrasions bilateral feet. Nails 1-5 bilateral  are thickened discolored and elongated with subungual debris. Hyperkeratotic lesion noted sub first metatarsal on the right foot.  Musculoskeletal: MMT 5/5  bilateral lower extremities in DF, PF, Inversion and Eversion. Deceased ROM in DF of ankle joint.  Neurological: Sensation intact to light touch. Protective intact diminished bilateral.    Assessment:   1. Pain due to onychomycosis of toenails of both feet   2. Coagulation defect [facility])        Plan:  Patient was evaluated and treated and all questions answered. -Mechanically debrided all nails 1-5 bilateral using sterile nail nipper and filed with dremel without incident  -Right second digit well healed.  -Answered all patient questions -Patient to return  in 3 months for at risk foot care -Patient advised to call the office if any problems or questions arise in the meantime.   Asberry Failing, DPM

## 2024-07-03 ENCOUNTER — Encounter: Payer: Self-pay | Admitting: Internal Medicine

## 2024-07-03 ENCOUNTER — Ambulatory Visit: Payer: Medicare HMO

## 2024-07-03 VITALS — Ht 64.0 in | Wt 149.0 lb

## 2024-07-03 DIAGNOSIS — Z0000 Encounter for general adult medical examination without abnormal findings: Secondary | ICD-10-CM

## 2024-07-03 DIAGNOSIS — M810 Age-related osteoporosis without current pathological fracture: Secondary | ICD-10-CM

## 2024-07-03 NOTE — Patient Instructions (Signed)
 Valerie Rollins , Thank you for taking time out of your busy schedule to complete your Annual Wellness Visit with me. I enjoyed our conversation and look forward to speaking with you again next year. I, as well as your care team,  appreciate your ongoing commitment to your health goals. Please review the following plan we discussed and let me know if I can assist you in the future. Your Game plan/ To Do List    Referrals: If you haven't heard from the office you've been referred to, please reach out to them at the phone provided.  You have an order for:  [x]   Bone Density     Please call for appointment: [facility] [place] [facility] [address] [place], [place] [address] [phone]    Make sure to wear two-piece clothing.  No lotions, powders, or deodorants the day of the appointment. Make sure to bring picture ID and insurance card.  Bring list of medications you are currently taking including any supplements.    Follow up Visits: We will see or speak with you next year for your Next Medicare AWV with our clinical staff Have you seen your provider in the last 6 months (3 months if uncontrolled diabetes)? Yes.  Next OV on 09/24/2024.  Clinician Recommendations:  Aim for 30 minutes of exercise or brisk walking, 6-8 glasses of water , and 5 servings of fruits and vegetables each day.       This is a list of the screenings recommended for you:  Health Maintenance  Topic Date Due   DEXA scan (bone density measurement)  03/01/2024   COVID-19 Vaccine (7 - 2024-25 season) 03/19/2024   Flu Shot  06/22/2024   Medicare Annual Wellness Visit  07/03/2025   DTaP/Tdap/Td vaccine (3 - Td or Tdap) 11/29/2026   Pneumococcal Vaccine for age over 43  Completed   Zoster (Shingles) Vaccine  Completed   Hepatitis B Vaccine  Aged Out   HPV Vaccine  Aged Out   Meningitis B Vaccine  Aged Out    Advanced directives: (Copy Requested) Please bring a copy of your health care power of attorney  and living will to the office to be added to your chart at your convenience. You can mail to [facility] [address] [place], [place] [address] or email to [email or web address] Advance Care Planning is important because it:  [x]  Makes sure you receive the medical care that is consistent with your values, goals, and preferences  [x]  It provides guidance to your family and loved ones and reduces their decisional burden about whether or not they are making the right decisions based on your wishes.  Follow the link provided in your after visit summary or read over the paperwork we have mailed to you to help you started getting your Advance Directives in place. If you need assistance in completing these, please reach out to us  so that we can help you!  See attachments for Preventive Care and Fall Prevention Tips.

## 2024-07-03 NOTE — Progress Notes (Signed)
 Subjective:   Valerie Rollins is a 88 y.o. who presents for a Medicare Wellness preventive visit.  As a reminder, Annual Wellness Visits don't include a physical exam, and some assessments may be limited, especially if this visit is performed virtually. We may recommend an in-person follow-up visit with your provider if needed.  Visit Complete: Virtual I connected with  Valerie Rollins on 07/03/24 by a audio enabled telemedicine application and verified that I am speaking with the correct person using two identifiers.  Patient Location: Home  Provider Location: Home Office  I discussed the limitations of evaluation and management by telemedicine. The patient expressed understanding and agreed to proceed.  Vital Signs: Because this visit was a virtual/telehealth visit, some criteria may be missing or patient reported. Any vitals not documented were not able to be obtained and vitals that have been documented are patient reported.  VideoDeclined- This patient declined Librarian, academic. Therefore the visit was completed with audio only.  Persons Participating in Visit: Patient.  AWV Questionnaire: No: Patient Medicare AWV questionnaire was not completed prior to this visit.  Cardiac Risk Factors include: advanced age (>44men, >32 women);hypertension;Other (see comment);dyslipidemia, Risk factor comments: CAD, A-Fib, CKD stage 3     Objective:    Today's Vitals   07/03/24 1446  Weight: 149 lb (67.6 kg)  Height: 5' 4 (1.626 m)   Body mass index is 25.58 kg/m.     07/03/2024    2:59 PM 06/27/2023    3:37 PM 08/04/2021    2:21 PM 01/09/2018    2:36 PM 05/20/2015    2:54 PM 04/01/2015    5:57 AM 11/14/2011    6:00 AM  Advanced Directives  Does Patient Have a Medical Advance Directive? Yes Yes Yes Yes  No  Yes  Patient has advance directive, copy in chart   Type of Advance Directive Healthcare Power of [place];Living will Healthcare Power of [place];Living  will Living will;Healthcare Power of [employer] Power of [place];Living will  Healthcare Power of [place];Living will  Healthcare Power of [place];Living will   Does patient want to make changes to medical advance directive?   No - Patient declined   No - Patient declined    Copy of Healthcare Power of Attorney in Chart? No - copy requested No - copy requested No - copy requested No - copy requested   No - copy requested  Copy requested from family   Would patient like information on creating a medical advance directive?     No - patient declined information     Pre-existing out of facility DNR order (yellow form or pink MOST form)       No      Data saved with a previous flowsheet row definition    Current Medications (verified) Outpatient Encounter Medications as of 07/03/2024  Medication Sig   albuterol  (VENTOLIN  HFA) 108 (90 Base) MCG/ACT inhaler INHALE 2 PUFFS BY MOUTH EVERY 6 HOURS AS NEEDED FOR WHEEZE OR SHORTNESS OF BREATH   amoxicillin  (AMOXIL ) 500 MG capsule Take 500 mg by mouth daily. 4 tablets prior to dental appointments.   Ascorbic Acid (VITAMIN C) 100 MG tablet Take 100 mg by mouth daily.   augmented betamethasone  dipropionate (DIPROLENE -AF) 0.05 % cream Apply topically as directed.   calcium  citrate (CALCITRATE - DOSED IN MG ELEMENTAL CALCIUM ) 950 (200 Ca) MG tablet Take 200 mg of elemental calcium  by mouth 2 (two) times daily.   cephALEXin  (KEFLEX ) 500 MG capsule  Take 1 capsule (500 mg total) by mouth 3 (three) times daily.   Cholecalciferol  (VITAMIN D3) 1000 UNITS CAPS Take 1,000 Units by mouth daily.    Coenzyme Q10 (CO Q 10 PO) Take by mouth daily.   denosumab  (PROLIA ) 60 MG/ML SOSY injection Inject 60 mg into the skin every 6 (six) months.   dicyclomine  (BENTYL ) 20 MG tablet Take 1 tablet (20 mg total) by mouth every 6 (six) hours as needed. for pain   ELIQUIS  5 MG TABS tablet TAKE 1 TABLET BY MOUTH TWICE A DAY   fexofenadine  (ALLEGRA ) 180 MG tablet Take 1  tablet (180 mg total) by mouth daily.   furosemide  (LASIX ) 40 MG tablet TAKE 1 TABLET BY MOUTH DAILY. TAKE AN EXTRA 1/2 TABLET DAILY AS NEEDED   Lactobacillus (PROBIOTIC ACIDOPHILUS PO) Take 1 capsule by mouth daily.   losartan  (COZAAR ) 50 MG tablet TAKE 1 TABLET BY MOUTH EVERY DAY   metoprolol  tartrate (LOPRESSOR ) 25 MG tablet TAKE 2 TABLETS EVERY MORNING AND 1 TABLET IN THE EVENING   omeprazole  (PRILOSEC) 20 MG capsule Take 1 capsule (20 mg total) by mouth daily.   Polyethyl Glycol-Propyl Glycol (SYSTANE OP) Apply 1 drop to eye in the morning and at bedtime. 1 drop per eye twice daily.   rosuvastatin  (CRESTOR ) 10 MG tablet TAKE 1 TABLET BY MOUTH EVERY DAY   traMADol  (ULTRAM ) 50 MG tablet Take 1 tablet (50 mg total) by mouth every 6 (six) hours as needed. Dr.Ramos (Patient taking differently: Take 50 mg by mouth every 6 (six) hours as needed. 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 s/p LIMA-LAD   Diverticulitis 2002   based on CT scan   Diverticulosis    Dr Abran   DVT (deep venous thrombosis) (HCC) 2000   no trigger   Endometriosis    Gastritis 2006   @ Endo   GERD (gastroesophageal reflux disease)    Hiatal hernia    Hyperlipidemia    Hypertension    IBS (irritable bowel syndrome)    Ocular hypertension    glaucoma suspect, Dr. Rosan   Skin cancer    Superficial phlebitis     X 2, 1999, 2000    Syncope 11/12/11   in context CAP . Carotid Doppler exam was negative. 2D echocardiogram revealed mild dilation of the left atrium and moderate increase in the systolic pressureof  the pulmonary artery   UTI (lower urinary tract infection) 01/2013   Strep bovis ; PCN sensitive   Past Surgical History:  Procedure Laterality Date   CARDIAC CATHETERIZATION N/A 03/28/2015   Procedure: Left Heart Cath and Coronary Angiography;  Surgeon: Toribio JONELLE Fuel, MD;  Location: [facility];  Service: Cardiovascular;  Laterality: N/A;   CATARACT EXTRACTION Bilateral    CATARACT EXTRACTION W/ INTRAOCULAR LENS IMPLANT     OS; Dr Rosan   COLONOSCOPY  1995, 2002, 2012   diverticulosis; Dr  Perry   CORONARY ARTERY BYPASS GRAFT N/A 04/01/2015   Procedure: CORONARY ARTERY BYPASS GRAFTING (CABG), ON PUMP, TIMES ONE, USING LEFT INTERNAL MAMMARY ARTERY;  Surgeon: Maude Fleeta Ochoa, MD;  Location: [facility] OR;  Service: Open Heart Surgery;  Laterality: N/A;   EXCISION OF ATRIAL MYXOMA N/A 04/01/2015   Procedure: EXCISION OF ATRIAL MYXOMA;  Surgeon: Maude Fleeta Ochoa, MD;  Location: [facility] OR;  Service: Open Heart Surgery;  Laterality: N/A;   ORIF ANKLE FRACTURE  11/14/2011   Procedure: OPEN REDUCTION INTERNAL FIXATION (ORIF) ANKLE FRACTURE;  Surgeon: Deward DELENA Schwartz;  Location: [facility] ORS;  Service: Orthopedics;  Laterality: Left;  open reduction internal fixation trimalleolar ankle fracture   TEE WITHOUT CARDIOVERSION N/A 04/01/2015   Procedure: TRANSESOPHAGEAL ECHOCARDIOGRAM (TEE);  Surgeon: Maude Fleeta Ochoa, MD;  Location: [facility] OR;  Service: Open Heart Surgery;  Laterality: N/A;   TONSILLECTOMY     TOTAL ABDOMINAL HYSTERECTOMY W/ BILATERAL SALPINGOOPHORECTOMY  1980   dysfunctional menses, endometriosis   VARICOSE VEIN SURGERY     1960, 1965, 2009   Family History  Problem Relation Age of Onset   Alzheimer's disease Mother 42       TIAs   Heart attack Father 63   Hyperlipidemia Brother    Hypertension Brother     Hypertension Brother    Prostate cancer Brother 71   Colon cancer Maternal Grandmother    Heart attack Paternal Grandmother 64   Diverticulitis Daughter        S/P colectomy   Diabetes Neg Hx    Stroke Neg Hx    Hypercalcemia Neg Hx    Stomach cancer Neg Hx    Pancreatic cancer Neg Hx    Esophageal cancer Neg Hx    Social History   Socioeconomic History   Marital status: Married    Spouse name: Valerie Biomedical scientist)   Number of children: 2   Years of education: Not on file   Highest education level: Not on file  Occupational History   Occupation: Retired    Associate Professor: RETIRED  Tobacco Use   Smoking status: Never   Smokeless tobacco: Never  Vaping Use   Vaping status: Never Used  Substance and Sexual Activity   Alcohol use: No   Drug use: No   Sexual activity: Not on file  Other Topics Concern   Not on file  Social History Narrative   REG EXERCISEHAD COLONOSCOPY AND ENDOSCOPY DONE DATES UNKNOWN   Lives with husband/2025   Social Drivers of Health   Financial Resource Strain: Low Risk  (07/03/2024)   Overall Financial Resource Strain (CARDIA)    Difficulty of Paying Living Expenses: Not hard at all  Food Insecurity: No Food Insecurity (07/03/2024)   Hunger Vital Sign    Worried About Running Out of Food in the Last Year: Never true    Ran Out of Food in the Last Year: Never true  Transportation Needs: No Transportation Needs (07/03/2024)   PRAPARE - Administrator, Civil Service (Medical): No    Lack of Transportation (Non-Medical): No  Physical Activity: Inactive (07/03/2024)   Exercise Vital Sign    Days of Exercise per Week: 0 days    Minutes of Exercise per Session: 0 min  Stress: No Stress Concern Present (07/03/2024)   [employer] of Occupational Health - Occupational Stress Questionnaire    Feeling of Stress: Not at all  Social Connections: Moderately Isolated (07/03/2024)   Social Connection and Isolation Panel    Frequency of  Communication with  Friends and Family: Not on file    Frequency of Social Gatherings with Friends and Family: Three times a week    Attends Religious Services: Never    Active Member of Clubs or Organizations: No    Attends Banker Meetings: Never    Marital Status: Married    Tobacco Counseling Counseling given: Not Answered    Clinical Intake:  Pre-visit preparation completed: Yes  Pain : No/denies pain     BMI - recorded: 25.58 Nutritional Status: BMI 25 -29 Overweight Nutritional Risks: None Diabetes: No  Lab Results  Component Value Date   HGBA1C 5.9 01/30/2024   HGBA1C 5.8 08/01/2023   HGBA1C 5.9 02/14/2023     How often do you need to have someone help you when you read instructions, pamphlets, or other written materials from your doctor or pharmacy?: 1 - Never  Interpreter Needed?: No  Information entered by :: Trenton Verne, RMA   Activities of Daily Living     07/03/2024    2:47 PM  In your present state of health, do you have any difficulty performing the following activities:  Hearing? 0  Vision? 0  Difficulty concentrating or making decisions? 0  Walking or climbing stairs? 0  Dressing or bathing? 0  Doing errands, shopping? 0  Preparing Food and eating ? N  Using the Toilet? N  In the past six months, have you accidently leaked urine? N  Do you have problems with loss of bowel control? N  Managing your Medications? N  Managing your Finances? N  Housekeeping or managing your Housekeeping? N  Comment famiy drives her around    Patient Care Team: Geofm Glade PARAS, MD as PCP - General (Internal Medicine) Pietro Redell RAMAN, MD as PCP - Cardiology (Cardiology) Leslee Reusing, MD as Consulting Physician (Ophthalmology) Szabat, Toribio BROCKS, [facility] (Inactive) (Pharmacist)  I have updated your Care Teams any recent Medical Services you may have received from other providers in the past year.     Assessment:   This is a routine wellness examination for  Valerie Rollins.  Hearing/Vision screen Hearing Screening - Comments:: Denies hearing difficulties   Vision Screening - Comments:: Wears eyeglasses/Valerie Rollins   Goals Addressed               This Visit's Progress     Patient Stated (pt-stated)        Not at this time./2025       Depression Screen     07/03/2024    3:03 PM 06/27/2023    3:43 PM 02/14/2023    1:13 PM 12/01/2022    3:26 PM 02/01/2022    2:50 PM 08/04/2021    2:28 PM 01/21/2020    2:56 PM  PHQ 2/9 Scores  PHQ - 2 Score 0 0 0 0 0 0 0  PHQ- 9 Score 1 0 0 0       Fall Risk     07/03/2024    2:59 PM 06/27/2023    3:37 PM 02/14/2023    1:12 PM 12/01/2022    3:26 PM 02/01/2022    2:49 PM  Fall Risk   Falls in the past year? 0 1 1 1  0  Number falls in past yr: 0 0 0 0 0  Injury with Fall? 0 1 1 1  0  Risk for fall due to :  Medication side effect History of fall(s) History of fall(s) No Fall Risks  Follow up Falls evaluation completed;Falls prevention discussed Falls  evaluation completed;Falls prevention discussed Falls evaluation completed Falls evaluation completed  Falls evaluation completed      Data saved with a previous flowsheet row definition    MEDICARE RISK AT HOME:  Medicare Risk at Home Any stairs in or around the home?: Yes (5 steps in front of house/has a ramp) If so, are there any without handrails?: No Home free of loose throw rugs in walkways, pet beds, electrical cords, etc?: Yes Adequate lighting in your home to reduce risk of falls?: Yes Life alert?: No Use of a cane, walker or w/c?: No Grab bars in the bathroom?: Yes Shower chair or bench in shower?: Yes Elevated toilet seat or a handicapped toilet?: Yes  TIMED UP AND GO:  Was the test performed?  No  Cognitive Function: 6CIT completed        07/03/2024    3:05 PM 06/27/2023    3:39 PM  6CIT Screen  What Year? 0 points 0 points  What month? 0 points 0 points  What time? 0 points 0 points  Count back from 20 0 points 0 points  Months in reverse  2 points 0 points  Repeat phrase 0 points 10 points  Total Score 2 points 10 points    Immunizations Immunization History  Administered Date(s) Administered   Fluad  Quad(high Dose 65+) 07/16/2019, 09/06/2020, 08/03/2021, 08/16/2022   Fluad  Trivalent(High Dose 65+) 09/19/2023   H1N1 12/09/2008   Influenza Split 09/23/2011, 08/31/2012   Influenza Whole 09/28/2007, 09/09/2008, 09/08/2009, 08/14/2010   Influenza, High Dose Seasonal PF 09/20/2013, 09/16/2015, 09/07/2016, 08/22/2017, 09/04/2018   Influenza,inj,Quad PF,6+ Mos 09/13/2014   Influenza,inj,quad, With Preservative 08/22/2017   PFIZER(Purple Top)SARS-COV-2 Vaccination 12/14/2019, 01/03/2020, 09/02/2020, 03/16/2021   Pfizer Covid-19 Vaccine Bivalent Booster 52yrs & up 09/13/2022   Pfizer(Comirnaty )Fall Seasonal Vaccine 12 years and older 09/19/2023   Pneumococcal Conjugate-13 09/13/2014   Pneumococcal Polysaccharide-23 09/25/2015   Td 06/02/2005   Tdap 11/29/2016   Zoster Recombinant(Shingrix) 03/10/2018, 06/23/2018   Zoster, Live 09/18/2012    Screening Tests Health Maintenance  Topic Date Due   DEXA SCAN  03/01/2024   COVID-19 Vaccine (7 - 2024-25 season) 03/19/2024   INFLUENZA VACCINE  06/22/2024   Medicare Annual Wellness (AWV)  07/03/2025   DTaP/Tdap/Td (3 - Td or Tdap) 11/29/2026   Pneumococcal Vaccine: 50+ Years  Completed   Zoster Vaccines- Shingrix  Completed   Hepatitis B Vaccines  Aged Out   HPV VACCINES  Aged Out   Meningococcal B Vaccine  Aged Out    Health Maintenance  Health Maintenance Due  Topic Date Due   DEXA SCAN  03/01/2024   COVID-19 Vaccine (7 - 2024-25 season) 03/19/2024   INFLUENZA VACCINE  06/22/2024   Health Maintenance Items Addressed: DEXA ordered, See Nurse Notes at the end of this note  Additional Screening:  Vision Screening: Recommended annual ophthalmology exams for early detection of glaucoma and other disorders of the eye. Would you like a referral to an eye doctor? No     Dental Screening: Recommended annual dental exams for proper oral hygiene  Community Resource Referral / Chronic Care Management: CRR required this visit?  No   CCM required this visit?  No   Plan:    I have personally reviewed and noted the following in the patient's chart:   Medical and social history Use of alcohol, tobacco or illicit drugs  Current medications and supplements including opioid prescriptions. Patient is currently taking opioid prescriptions. Information provided to patient regarding non-opioid alternatives. Patient advised to  discuss non-opioid treatment plan with their provider. Functional ability and status Nutritional status Physical activity Advanced directives List of other physicians Hospitalizations, surgeries, and ER visits in previous 12 months Vitals Screenings to include cognitive, depression, and falls Referrals and appointments  In addition, I have reviewed and discussed with patient certain preventive protocols, quality metrics, and best practice recommendations. A written personalized care plan for preventive services as well as general preventive health recommendations were provided to patient.   Donnisha Besecker L Yaniv Rollins, CMA   07/03/2024   After Visit Summary: (MyChart) Due to this being a telephonic visit, the after visit summary with patients personalized plan was offered to patient via MyChart   Notes: Patient due for a DEXA and order has been placed today.  Patient is up to date on all other health maintenance with no concerns to address today.

## 2024-07-10 ENCOUNTER — Other Ambulatory Visit: Payer: Self-pay

## 2024-07-10 DIAGNOSIS — M810 Age-related osteoporosis without current pathological fracture: Secondary | ICD-10-CM

## 2024-07-10 MED ORDER — DENOSUMAB 60 MG/ML ~~LOC~~ SOSY: 60.0000 mg | PREFILLED_SYRINGE | Freq: Once | SUBCUTANEOUS | Status: DC

## 2024-07-24 ENCOUNTER — Encounter: Payer: Self-pay | Admitting: Cardiology

## 2024-08-06 ENCOUNTER — Ambulatory Visit: Admitting: Podiatry

## 2024-08-06 ENCOUNTER — Encounter: Payer: Self-pay | Admitting: Podiatry

## 2024-08-06 DIAGNOSIS — M79675 Pain in left toe(s): Secondary | ICD-10-CM | POA: Diagnosis not present

## 2024-08-06 DIAGNOSIS — M79674 Pain in right toe(s): Secondary | ICD-10-CM

## 2024-08-06 DIAGNOSIS — B351 Tinea unguium: Secondary | ICD-10-CM

## 2024-08-06 DIAGNOSIS — D689 Coagulation defect, unspecified: Secondary | ICD-10-CM | POA: Diagnosis not present

## 2024-08-06 NOTE — Progress Notes (Signed)
  Subjective:  Patient ID: Valerie Rollins, female    DOB: February 02, 1936,   MRN: [id number]  Chief Complaint  Patient presents with   Nail Problem    Trim my toenails.    88 y.o. female presents for concern of thickened elongated and painful nails that are difficult to trim. Also relates some callus area on the ball of her right foot. Requesting to have them trimmed today. Patient is on eliquis     PCP:  Geofm Glade PARAS, MD    . Denies any other pedal complaints. Denies n/v/f/c.   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 s/p LIMA-LAD   Diverticulitis 2002   based on CT scan   Diverticulosis    Dr Abran   DVT (deep venous thrombosis) (HCC) 2000   no trigger   Endometriosis    Gastritis 2006   @ Endo   GERD (gastroesophageal reflux disease)    Hiatal hernia    Hyperlipidemia    Hypertension    IBS (irritable bowel syndrome)    Ocular hypertension    glaucoma suspect, Dr. Rosan   Skin cancer    Superficial phlebitis     X 2, 1999, 2000   Syncope 11/12/11   in context CAP . Carotid Doppler exam was negative. 2D echocardiogram revealed mild dilation of the left atrium and moderate increase in the systolic pressureof  the pulmonary artery   UTI (lower urinary tract infection) 01/2013   Strep bovis ; PCN sensitive    Objective:  Physical Exam: Vascular: DP/PT pulses 2/4 bilateral. CFT <3 seconds. Absent hair growth on digits. Edema noted to bilateral lower extremities. Xerosis noted bilaterally.  Skin. No lacerations or abrasions bilateral feet. Nails 1-5 bilateral  are thickened discolored and elongated with subungual debris. Hyperkeratotic lesion noted sub first metatarsal on the right foot.  Musculoskeletal: MMT 5/5 bilateral  lower extremities in DF, PF, Inversion and Eversion. Deceased ROM in DF of ankle joint.  Neurological: Sensation intact to light touch. Protective intact diminished bilateral.    Assessment:   1. Pain due to onychomycosis of toenails of both feet   2. Coagulation defect (HCC)   3. Callus        Plan:  Patient was evaluated and treated and all questions answered. -Mechanically debrided all nails 1-5 bilateral using sterile nail nipper and filed with dremel without incident  -Right second digit well healed.  -Answered all patient questions -Patient to return  in 3 months for at risk foot care -Patient advised to call the office if any problems or questions arise in the meantime.   Asberry Failing, DPM

## 2024-08-10 ENCOUNTER — Telehealth: Payer: Self-pay

## 2024-08-10 NOTE — Telephone Encounter (Signed)
 Prolia  VOB initiated via [email or web address]  Next Prolia  inj DUE: 09/11/24

## 2024-08-13 ENCOUNTER — Other Ambulatory Visit (HOSPITAL_COMMUNITY): Payer: Self-pay

## 2024-08-13 NOTE — Telephone Encounter (Signed)
 [employer] still covers Prolia  through Part B (buy and bill). Coverage is the same. The only thing changed is Part D (pharmacy coverage). Part D prefers Jubbonti now.   Pt ready for scheduling for PROLIA  on or after : 09/11/24  Option# 1: Buy/Bill (Office supplied medication)  Out-of-pocket cost due at time of clinic visit: $352  Number of injection/visits approved: ---  Primary: [employer] Prolia  co-insurance: 20% Admin fee co-insurance: $20  Secondary: --- Prolia  co-insurance:  Admin fee co-insurance:   Medical Benefit Details: Date Benefits were checked: 08/13/24 Deductible: NO/ Coinsurance: 20%/ Admin Fee: $20  Prior Auth: N/A PA# Expiration Date:   # of doses approved: ----------------------------------------------------------------------- Option# 2- Med Obtained from pharmacy: Prolia  is no longer preferred for pharmacy benefit. Jubbonti is now preferred. PRICING IS FOR JUBBONTI  Pharmacy benefit: Copay $0 (Paid to pharmacy) Admin Fee: $20 (Pay at clinic)  Prior Auth: N/A PA# Expiration Date:   # of doses approved:   If patient wants fill through the pharmacy benefit please send prescription to: [facility], and include estimated need by date in rx notes. Pharmacy will ship medication directly to the office.  Patient NOT eligible for Prolia  Copay Card. Copay Card can make patient's cost as little as $25. Link to apply: [email or web address]://www.[email or web address]/copay  ** This summary of benefits is an estimation of the patient's out-of-pocket cost. Exact cost may very based on individual plan coverage.

## 2024-08-13 NOTE — Telephone Encounter (Signed)
 Valerie Rollins

## 2024-08-16 ENCOUNTER — Encounter: Payer: Self-pay | Admitting: Internal Medicine

## 2024-08-30 ENCOUNTER — Other Ambulatory Visit: Payer: Self-pay | Admitting: Cardiology

## 2024-08-30 NOTE — Telephone Encounter (Signed)
 Prescription refill request for Eliquis  received. Indication:afib Last office visit:12/24 Scr:0.95  3/25 Age: 88 Weight:67.6  kg  Prescription refilled

## 2024-09-03 ENCOUNTER — Ambulatory Visit (INDEPENDENT_AMBULATORY_CARE_PROVIDER_SITE_OTHER): Admitting: Podiatry

## 2024-09-03 DIAGNOSIS — D689 Coagulation defect, unspecified: Secondary | ICD-10-CM

## 2024-09-03 DIAGNOSIS — M79674 Pain in right toe(s): Secondary | ICD-10-CM

## 2024-09-03 DIAGNOSIS — B351 Tinea unguium: Secondary | ICD-10-CM

## 2024-09-03 DIAGNOSIS — M79675 Pain in left toe(s): Secondary | ICD-10-CM

## 2024-09-03 NOTE — Progress Notes (Signed)
 Patient presented to the office for nail care about 4 weeks after seeing Dr.  Sikora.  She decided to not be seen today for her nails since the time frame was short.  She will be rescheduled in two months.  Cordella Bold DPM

## 2024-09-04 ENCOUNTER — Encounter: Payer: Self-pay | Admitting: Cardiology

## 2024-09-04 ENCOUNTER — Other Ambulatory Visit: Payer: Self-pay | Admitting: Internal Medicine

## 2024-09-04 ENCOUNTER — Encounter: Payer: Self-pay | Admitting: Internal Medicine

## 2024-09-05 ENCOUNTER — Other Ambulatory Visit (HOSPITAL_BASED_OUTPATIENT_CLINIC_OR_DEPARTMENT_OTHER): Payer: Self-pay

## 2024-09-05 MED ORDER — COMIRNATY 30 MCG/0.3ML IM SUSY
0.3000 mL | PREFILLED_SYRINGE | Freq: Once | INTRAMUSCULAR | 0 refills | Status: AC
  Filled 2024-09-05: qty 0.3, 1d supply, fill #0

## 2024-09-05 MED ORDER — FLUZONE HIGH-DOSE 0.5 ML IM SUSY
0.5000 mL | PREFILLED_SYRINGE | Freq: Once | INTRAMUSCULAR | 0 refills | Status: AC
  Filled 2024-09-05: qty 0.5, 1d supply, fill #0

## 2024-09-11 ENCOUNTER — Other Ambulatory Visit: Payer: Self-pay

## 2024-09-11 ENCOUNTER — Other Ambulatory Visit (HOSPITAL_COMMUNITY): Payer: Self-pay

## 2024-09-11 DIAGNOSIS — M810 Age-related osteoporosis without current pathological fracture: Secondary | ICD-10-CM

## 2024-09-11 MED ORDER — JUBBONTI 60 MG/ML ~~LOC~~ SOSY
60.0000 mg | PREFILLED_SYRINGE | SUBCUTANEOUS | 0 refills | Status: AC
Start: 2024-09-11 — End: ?
  Filled 2024-09-11 – 2024-09-12 (×2): qty 1, 180d supply, fill #0

## 2024-09-12 ENCOUNTER — Other Ambulatory Visit (HOSPITAL_COMMUNITY): Payer: Self-pay

## 2024-09-12 ENCOUNTER — Other Ambulatory Visit: Payer: Self-pay

## 2024-09-12 NOTE — Progress Notes (Signed)
 Pharmacy Patient Advocate Encounter  Insurance verification completed.   The patient is insured through [facility] ADVANTAGE/RX ADVANCE   Ran test claim for Jubbonti . Co-pay is $0.  This test claim was processed through [facility]- copay amounts may vary at other pharmacies due to pharmacy/plan contracts, or as the patient moves through the different stages of their insurance plan.

## 2024-09-12 NOTE — Progress Notes (Signed)
 Specialty Pharmacy Initial Fill Coordination Note  Valerie Rollins is a 88 y.o. female contacted today regarding initial fill of specialty medication(s) Denosumab -bernett (Jubbonti)   Patient requested Courier to Provider Office   Delivery date: 09/19/24   Verified address: [place] [address]   Medication will be filled on 10/28.   Patient is aware of $0 copayment.

## 2024-09-17 ENCOUNTER — Ambulatory Visit (INDEPENDENT_AMBULATORY_CARE_PROVIDER_SITE_OTHER)
Admission: RE | Admit: 2024-09-17 | Discharge: 2024-09-17 | Disposition: A | Discharge status: Home / Self Care | Source: Ambulatory Visit | Attending: Internal Medicine | Admitting: Internal Medicine

## 2024-09-17 DIAGNOSIS — M810 Age-related osteoporosis without current pathological fracture: Secondary | ICD-10-CM

## 2024-09-18 ENCOUNTER — Other Ambulatory Visit: Payer: Self-pay

## 2024-09-23 ENCOUNTER — Encounter: Payer: Self-pay | Admitting: Internal Medicine

## 2024-09-23 ENCOUNTER — Ambulatory Visit: Payer: Self-pay | Admitting: Internal Medicine

## 2024-09-23 DIAGNOSIS — M810 Age-related osteoporosis without current pathological fracture: Secondary | ICD-10-CM

## 2024-09-23 NOTE — Patient Instructions (Addendum)
   Jubbonti injection given.    Blood work was ordered.       Medications changes include : none  For the runny nose - anti-histamine otc plus saline nasal spray or prescription nasal spray that I can prescribe    Return in about 6 months (around 03/24/2025) for Physical Exam.

## 2024-09-23 NOTE — Progress Notes (Unsigned)
 Subjective:    Patient ID: Valerie Rollins, female    DOB: 03/18/1936, 88 y.o.   MRN: [id number]     HPI Shruthi is here for follow up of her chronic medical problems.    Medications and allergies reviewed with patient and updated if appropriate.  Current Outpatient Medications on File Prior to Visit  Medication Sig Dispense Refill   albuterol  (VENTOLIN  HFA) 108 (90 Base) MCG/ACT inhaler INHALE 2 PUFFS BY MOUTH EVERY 6 HOURS AS NEEDED FOR WHEEZE OR SHORTNESS OF BREATH 18 each 5   amoxicillin  (AMOXIL ) 500 MG capsule Take 500 mg by mouth daily. 4 tablets prior to dental appointments.     Ascorbic Acid (VITAMIN C) 100 MG tablet Take 100 mg by mouth daily.     augmented betamethasone  dipropionate (DIPROLENE -AF) 0.05 % cream Apply topically as directed.     calcium  citrate (CALCITRATE - DOSED IN MG ELEMENTAL CALCIUM ) 950 (200 Ca) MG tablet Take 200 mg of elemental calcium  by mouth 2 (two) times daily.     cephALEXin  (KEFLEX ) 500 MG capsule Take 1 capsule (500 mg total) by mouth 3 (three) times daily. 21 capsule 0   Cholecalciferol  (VITAMIN D3) 1000 UNITS CAPS Take 1,000 Units by mouth daily.      Coenzyme Q10 (CO Q 10 PO) Take by mouth daily.     denosumab  (PROLIA ) 60 MG/ML SOSY injection Inject 60 mg into the skin every 6 (six) months.     denosumab -bbdz (JUBBONTI) 60 MG/ML SOSY injection Inject 60 mg into the skin every 6 (six) months. 1 mL 0   dicyclomine  (BENTYL ) 20 MG tablet TAKE 1 TABLET BY MOUTH EVERY 6 HOURS AS NEEDED FOR PAIN 270 tablet 3   ELIQUIS  5 MG TABS tablet TAKE 1 TABLET BY MOUTH TWICE A DAY 60 tablet 5   fexofenadine  (ALLEGRA ) 180 MG tablet Take 1 tablet (180 mg total) by mouth daily. 30 tablet 11   furosemide  (LASIX ) 40 MG tablet TAKE 1 TABLET BY MOUTH DAILY. TAKE AN EXTRA 1/2 TABLET DAILY AS NEEDED 135 tablet 1   Lactobacillus (PROBIOTIC ACIDOPHILUS PO) Take 1 capsule by mouth daily.     losartan  (COZAAR ) 50 MG tablet TAKE 1 TABLET BY MOUTH EVERY DAY 90 tablet 2    metoprolol  tartrate (LOPRESSOR ) 25 MG tablet TAKE 2 TABLETS EVERY MORNING AND 1 TABLET IN THE EVENING 270 tablet 3   omeprazole  (PRILOSEC) 20 MG capsule Take 1 capsule (20 mg total) by mouth daily. 90 capsule 3   Polyethyl Glycol-Propyl Glycol (SYSTANE OP) Apply 1 drop to eye in the morning and at bedtime. 1 drop per eye twice daily.     rosuvastatin  (CRESTOR ) 10 MG tablet TAKE 1 TABLET BY MOUTH EVERY DAY 90 tablet 3   traMADol  (ULTRAM ) 50 MG tablet Take 1 tablet (50 mg total) by mouth every 6 (six) hours as needed. Dr.Ramos (Patient taking differently: Take 50 mg by mouth every 6 (six) hours as needed. Dr.Ramos; pt takes 1 tablet every night, maybe 1 during the day as needed) 30 tablet 0   Current Facility-Administered Medications on File Prior to Visit  Medication Dose Route Frequency Provider Last Rate Last Admin   denosumab  (PROLIA ) injection 60 mg  60 mg Subcutaneous Once Geofm Glade PARAS, MD       denosumab  (PROLIA ) injection 60 mg  60 mg Subcutaneous Once Geofm Glade PARAS, MD         Review of Systems     Objective:  There  were no vitals filed for this visit. BP Readings from Last 3 Encounters:  01/30/24 132/70  12/14/23 134/70  11/18/23 (!) 150/70   Wt Readings from Last 3 Encounters:  07/03/24 149 lb (67.6 kg)  04/10/24 149 lb (67.6 kg)  01/30/24 149 lb (67.6 kg)   There is no height or weight on file to calculate BMI.    Physical Exam     Lab Results  Component Value Date   WBC 6.2 01/30/2024   HGB 14.0 01/30/2024   HCT 41.8 01/30/2024   PLT 187.0 01/30/2024   GLUCOSE 101 (H) 01/30/2024   CHOL 146 01/30/2024   TRIG 60.0 01/30/2024   HDL 85.70 01/30/2024   LDLDIRECT 152.6 07/31/2013   LDLCALC 48 01/30/2024   ALT 40 (H) 01/30/2024   AST 40 (H) 01/30/2024   NA 143 01/30/2024   K 4.6 01/30/2024   CL 104 01/30/2024   CREATININE 0.95 01/30/2024   BUN 37 (H) 01/30/2024   CO2 30 01/30/2024   TSH 2.33 02/14/2023   INR 1.4 07/31/2015   HGBA1C 5.9 01/30/2024      Assessment & Plan:    See Problem List for Assessment and Plan of chronic medical problems.

## 2024-09-24 ENCOUNTER — Encounter: Payer: Self-pay | Admitting: Internal Medicine

## 2024-09-24 ENCOUNTER — Encounter: Payer: Self-pay | Admitting: Radiology

## 2024-09-24 ENCOUNTER — Ambulatory Visit: Admitting: Internal Medicine

## 2024-09-24 VITALS — BP 130/70 | HR 87 | Temp 98.0°F | Ht 64.0 in | Wt 147.0 lb

## 2024-09-24 DIAGNOSIS — I4891 Unspecified atrial fibrillation: Secondary | ICD-10-CM | POA: Diagnosis not present

## 2024-09-24 DIAGNOSIS — E7800 Pure hypercholesterolemia, unspecified: Secondary | ICD-10-CM | POA: Diagnosis not present

## 2024-09-24 DIAGNOSIS — N1831 Chronic kidney disease, stage 3a: Secondary | ICD-10-CM | POA: Diagnosis not present

## 2024-09-24 DIAGNOSIS — K219 Gastro-esophageal reflux disease without esophagitis: Secondary | ICD-10-CM | POA: Diagnosis not present

## 2024-09-24 DIAGNOSIS — I2583 Coronary atherosclerosis due to lipid rich plaque: Secondary | ICD-10-CM

## 2024-09-24 DIAGNOSIS — M810 Age-related osteoporosis without current pathological fracture: Secondary | ICD-10-CM

## 2024-09-24 DIAGNOSIS — J310 Chronic rhinitis: Secondary | ICD-10-CM

## 2024-09-24 DIAGNOSIS — R600 Localized edema: Secondary | ICD-10-CM | POA: Diagnosis not present

## 2024-09-24 DIAGNOSIS — I2510 Atherosclerotic heart disease of native coronary artery without angina pectoris: Secondary | ICD-10-CM | POA: Diagnosis not present

## 2024-09-24 DIAGNOSIS — R7303 Prediabetes: Secondary | ICD-10-CM

## 2024-09-24 DIAGNOSIS — I10 Essential (primary) hypertension: Secondary | ICD-10-CM | POA: Diagnosis not present

## 2024-09-24 LAB — CBC WITH DIFFERENTIAL/PLATELET
Basophils Absolute: 0 K/uL (ref 0.0–0.1)
Basophils Relative: 0.8 % (ref 0.0–3.0)
Eosinophils Absolute: 0.1 K/uL (ref 0.0–0.7)
Eosinophils Relative: 1.2 % (ref 0.0–5.0)
HCT: 38.6 % (ref 36.0–46.0)
Hemoglobin: 13.3 g/dL (ref 12.0–15.0)
Lymphocytes Relative: 16.8 % (ref 12.0–46.0)
Lymphs Abs: 1 K/uL (ref 0.7–4.0)
MCHC: 34.4 g/dL (ref 30.0–36.0)
MCV: 90.7 fl (ref 78.0–100.0)
Monocytes Absolute: 0.5 K/uL (ref 0.1–1.0)
Monocytes Relative: 8.5 % (ref 3.0–12.0)
Neutro Abs: 4.4 K/uL (ref 1.4–7.7)
Neutrophils Relative %: 72.7 % (ref 43.0–77.0)
Platelets: 171 K/uL (ref 150.0–400.0)
RBC: 4.25 Mil/uL (ref 3.87–5.11)
RDW: 13.3 % (ref 11.5–15.5)
WBC: 6 K/uL (ref 4.0–10.5)

## 2024-09-24 LAB — LIPID PANEL
Cholesterol: 138 mg/dL (ref 0–200)
HDL: 75.3 mg/dL (ref >39.00)
LDL Cholesterol: 44 mg/dL (ref 0–99)
NonHDL: 62.88
Total CHOL/HDL Ratio: 2
Triglycerides: 96 mg/dL (ref 0.0–149.0)
VLDL: 19.2 mg/dL (ref 0.0–40.0)

## 2024-09-24 LAB — COMPREHENSIVE METABOLIC PANEL WITH GFR
ALT: 33 U/L (ref 0–35)
AST: 39 U/L — ABNORMAL HIGH (ref 0–37)
Albumin: 4.3 g/dL (ref 3.5–5.2)
Alkaline Phosphatase: 57 U/L (ref 39–117)
BUN: 39 mg/dL — ABNORMAL HIGH (ref 6–23)
CO2: 28 meq/L (ref 19–32)
Calcium: 9.8 mg/dL (ref 8.4–10.5)
Chloride: 106 meq/L (ref 96–112)
Creatinine, Ser: 0.92 mg/dL (ref 0.40–1.20)
GFR: 56.53 mL/min — ABNORMAL LOW (ref >60.00)
Glucose, Bld: 98 mg/dL (ref 70–99)
Potassium: 4.2 meq/L (ref 3.5–5.1)
Sodium: 142 meq/L (ref 135–145)
Total Bilirubin: 0.7 mg/dL (ref 0.2–1.2)
Total Protein: 6.7 g/dL (ref 6.0–8.3)

## 2024-09-24 LAB — TSH: TSH: 3.01 u[IU]/mL (ref 0.35–5.50)

## 2024-09-24 LAB — VITAMIN D 25 HYDROXY (VIT D DEFICIENCY, FRACTURES): VITD: 32.12 ng/mL (ref 30.00–100.00)

## 2024-09-24 LAB — HEMOGLOBIN A1C: Hgb A1c MFr Bld: 5.9 % (ref 4.6–6.5)

## 2024-09-24 MED ORDER — CLOTRIMAZOLE-BETAMETHASONE 1-0.05 % EX CREA: 1.0000 | TOPICAL_CREAM | Freq: Every day | CUTANEOUS | 2 refills | Status: AC

## 2024-09-24 MED ORDER — DENOSUMAB-BBDZ 60 MG/ML ~~LOC~~ SOSY: 60.0000 mg | PREFILLED_SYRINGE | Freq: Once | SUBCUTANEOUS | Status: AC

## 2024-09-24 MED ORDER — DENOSUMAB-BBDZ 60 MG/ML ~~LOC~~ SOSY
60.0000 mg | PREFILLED_SYRINGE | Freq: Once | SUBCUTANEOUS | Status: AC
  Administered 2024-09-24: 60 mg via SUBCUTANEOUS

## 2024-09-24 NOTE — Assessment & Plan Note (Signed)
 Chronic Blood pressure well controlled CMP, CBC Continue losartan 50 mg daily, metoprolol 50 mg in the morning, 25 mg in the evening

## 2024-09-24 NOTE — Assessment & Plan Note (Signed)
Chronic GERD controlled Continue omeprazole 20 mg daily  

## 2024-09-24 NOTE — Assessment & Plan Note (Signed)
 Chronic Lab Results  Component Value Date   HGBA1C 5.9 09/24/2024   7 6 check a1c Low sugar / carb diet Encouraged as much activity as possible

## 2024-09-24 NOTE — Assessment & Plan Note (Signed)
 Chronic Continue Jubbonti every 6 months here in the office Jubbonti given today Continue calcium  and vitamin D  supplementation Check vitamin D  level

## 2024-09-24 NOTE — Assessment & Plan Note (Signed)
 Chronic Secondary to venous insufficiency from varicose veins Stable Continue furosemide 40 mg daily Continue wearing compression socks, elevating when able She is active during the day we will continue to walk around inside the house

## 2024-09-24 NOTE — Assessment & Plan Note (Signed)
 Chronic Healthy diet encouraged Check lipid panel  Continue Crestor  10 mg daily

## 2024-09-24 NOTE — Assessment & Plan Note (Signed)
 Chronic Paroxysmal-in sinus rhythm here today Follows with cardiology On Eliquis 5 mg twice daily, metoprolol 50 mg in the morning and 25 mg in evening  cmp,cbc

## 2024-09-24 NOTE — Assessment & Plan Note (Signed)
 New Element of gustatory rhinitis and addition to frequent runny nose outside of eating Experiencing frequent runny nose after eating and outside of eating Not related to any specific foods-spicy foods Can try taking an Alligood histamine if she is not already taking 1-can use saline nasal spray on top of that to avoid dryness He can consider ipratropium nasal spray if the above is not effective-she will let me know

## 2024-09-24 NOTE — Assessment & Plan Note (Addendum)
 Chronic She is very inactive- denies short of breath with exertion She is up-to-date with cardiology

## 2024-09-24 NOTE — Assessment & Plan Note (Signed)
 Chronic Stage 3a CMP, CBC

## 2024-09-25 ENCOUNTER — Ambulatory Visit: Payer: Self-pay | Admitting: Internal Medicine

## 2024-09-26 MED ORDER — IPRATROPIUM BROMIDE 0.03 % NA SOLN: 2.0000 | Freq: Two times a day (BID) | NASAL | 12 refills | Status: AC

## 2024-10-01 ENCOUNTER — Ambulatory Visit: Admitting: Podiatry

## 2024-10-08 DIAGNOSIS — H5201 Hypermetropia, right eye: Secondary | ICD-10-CM | POA: Diagnosis not present

## 2024-10-08 DIAGNOSIS — H40013 Open angle with borderline findings, low risk, bilateral: Secondary | ICD-10-CM | POA: Diagnosis not present

## 2024-10-08 DIAGNOSIS — Z961 Presence of intraocular lens: Secondary | ICD-10-CM | POA: Diagnosis not present

## 2024-10-29 ENCOUNTER — Ambulatory Visit: Admitting: Podiatry

## 2024-11-05 ENCOUNTER — Encounter: Payer: Self-pay | Admitting: Podiatry

## 2024-11-05 ENCOUNTER — Ambulatory Visit: Admitting: Podiatry

## 2024-11-05 DIAGNOSIS — M79675 Pain in left toe(s): Secondary | ICD-10-CM

## 2024-11-05 DIAGNOSIS — B351 Tinea unguium: Secondary | ICD-10-CM | POA: Diagnosis not present

## 2024-11-05 DIAGNOSIS — M79674 Pain in right toe(s): Secondary | ICD-10-CM | POA: Diagnosis not present

## 2024-11-05 DIAGNOSIS — D689 Coagulation defect, unspecified: Secondary | ICD-10-CM

## 2024-11-05 NOTE — Progress Notes (Signed)
°  Subjective:  Patient ID: Valerie Rollins, female    DOB: 10/23/1936,   MRN: [id number]  Chief Complaint  Patient presents with   Nail Problem    Toenails    88 y.o. female presents for concern of thickened elongated and painful nails that are difficult to trim. Also relates some callus area on the ball of her right foot. Requesting to have them trimmed today. Patient is on eliquis     PCP:  Geofm Glade PARAS, MD    . Denies any other pedal complaints. Denies n/v/f/c.   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 s/p LIMA-LAD   Diverticulitis 2002   based on CT scan   Diverticulosis    Dr Abran   DVT (deep venous thrombosis) (HCC) 2000   no trigger   Endometriosis    Gastritis 2006   @ [facility]   GERD (gastroesophageal reflux disease)    Hiatal hernia    Hyperlipidemia    Hypertension    IBS (irritable bowel syndrome)    Ocular hypertension    glaucoma suspect, Dr. Rosan   Skin cancer    Superficial phlebitis     X 2, 1999, 2000   Syncope 11/12/11   in context CAP . Carotid Doppler exam was negative. 2D echocardiogram revealed mild dilation of the left atrium and moderate increase in the systolic pressureof  the pulmonary artery   UTI (lower urinary tract infection) 01/2013   Strep bovis ; PCN sensitive    Objective:  Physical Exam: Vascular: DP/PT pulses 2/4 bilateral. CFT <3 seconds. Absent hair growth on digits. Edema noted to bilateral lower extremities. Xerosis noted bilaterally.  Skin. No lacerations or abrasions bilateral feet. Nails 1-5 bilateral  are thickened discolored and elongated with subungual debris. Hyperkeratotic lesion noted sub first metatarsal on the right foot.  Musculoskeletal: MMT 5/5 bilateral lower  extremities in DF, PF, Inversion and Eversion. Deceased ROM in DF of ankle joint.  Neurological: Sensation intact to light touch. Protective intact diminished bilateral.    Assessment:   1. Pain due to onychomycosis of toenails of both feet   2. Coagulation defect        Plan:  Patient was evaluated and treated and all questions answered. -Mechanically debrided all nails 1-5 bilateral using sterile nail nipper and filed with dremel without incident  -Right second digit well healed.  -Answered all patient questions -Patient to return  in 3 months for at risk foot care -Patient advised to call the office if any problems or questions arise in the meantime.   Asberry Failing, DPM

## 2024-11-26 NOTE — Progress Notes (Signed)
 "   Subjective:    Patient ID: Valerie Rollins, female    DOB: 1936/08/22, 89 y.o.   MRN: [id number]      HPI Valerie Rollins is here for  Chief Complaint  Patient presents with   Cough    Dry cough for the past few days until today. Orange color mucus.    Discussed the use of AI scribe software for clinical note transcription with the patient, who gave verbal consent to proceed.  History of Present Illness Valerie Rollins is an 89 year old female  who presents with a persistent cough.  She is here with her daughter who helps provide some history.  Her symptoms began on Saturday with a persistent dry and raspy cough, occasionally producing a small amount of orange mucus, noted twice. No fever, chills, or significant changes in appetite. She uses a nasal spray, which she feels helps, but is concerned about its correct usage and potential interactions with her other medications.  She experiences severe cramps that wake her up, relieved by consuming mustard. No sinus pain, pressure, headaches, or dizziness, but she feels lightheaded if standing up too quickly. She reports shortness of breath, attributed to irregular use of her albuterol  inhaler, which she has started using again as needed for wheezing and shortness of breath. She uses the inhaler every six hours as needed, primarily when she hears 'rattling' or experiences wheezing.  She has a history of atrial fibrillation and reports that she was told in the hospital that she has it all the time, but she does not feel any symptoms related to it. She also reports leg swelling, managed with compression socks and medication. She is concerned about dehydration due to her diuretic use, specifically furosemide , and its potential interaction with losartan .  Her family history includes gastrointestinal issues, specifically irritable bowel syndrome (IBS). She manages her symptoms by adjusting her fiber intake and occasionally using anti-diarrheal medications. No  recent acute gastrointestinal symptoms.  Her cough does not keep her up at night, but she may cough after waking up to use the bathroom.       Medications and allergies reviewed with patient and updated if appropriate.  Medications Ordered Prior to Encounter[1]  Review of Systems  Constitutional:  Negative for appetite change, chills and fever.  HENT:  Negative for congestion, sinus pressure, sinus pain and sore throat.   Respiratory:  Positive for cough (mostly dry  - a little mucus last night and this morning - slightly orange), shortness of breath (chronic  - ? worse) and wheezing.   Gastrointestinal:  Negative for diarrhea and nausea.  Neurological:  Negative for dizziness, light-headedness and headaches.       Objective:   Vitals:   11/27/24 1536  BP: 122/80  Pulse: 65  Temp: 98 F (36.7 C)  SpO2: 95%   BP Readings from Last 3 Encounters:  11/27/24 122/80  09/24/24 130/70  01/30/24 132/70   Wt Readings from Last 3 Encounters:  11/27/24 155 lb (70.3 kg)  09/24/24 147 lb (66.7 kg)  07/03/24 149 lb (67.6 kg)   Body mass index is 26.61 kg/m.    Physical Exam Constitutional:      General: She is not in acute distress.    Appearance: Normal appearance. She is not ill-appearing.  HENT:     Head: Normocephalic and atraumatic.     Right Ear: Tympanic membrane, ear canal and external ear normal.     Left Ear: Tympanic membrane, ear canal and  external ear normal.     Mouth/Throat:     Mouth: Mucous membranes are moist.     Pharynx: No oropharyngeal exudate or posterior oropharyngeal erythema.  Eyes:     Conjunctiva/sclera: Conjunctivae normal.  Cardiovascular:     Rate and Rhythm: Normal rate and regular rhythm.     Heart sounds: Murmur (3/6 sys) heard.  Pulmonary:     Effort: Pulmonary effort is normal. No respiratory distress.     Breath sounds: Wheezing (a couple of wheeze with expiration on right side) present. No rales.  Musculoskeletal:     Cervical  back: Neck supple. No tenderness.     Right lower leg: Edema (1+) present.     Left lower leg: Edema (1+) present.  Lymphadenopathy:     Cervical: No cervical adenopathy.  Skin:    General: Skin is warm and dry.  Neurological:     Mental Status: She is alert.        DG Chest 2 View EXAM: 2 VIEW(S) XRAY OF THE CHEST 11/27/2024 04:29:31 PM  COMPARISON: None available.  CLINICAL HISTORY: cough, wheeze, r/o PNA, pulm edema  FINDINGS:  LUNGS AND PLEURA: Mild pulmonary edema. Interstitial prominence. No focal pulmonary opacity. No pleural effusion. No pneumothorax.  HEART AND MEDIASTINUM: Cardiomegaly. Aortic atherosclerosis.  BONES AND SOFT TISSUES: Median sternotomy noted. Degenerative changes. No acute osseous abnormality.  IMPRESSION: 1. Mild pulmonary edema and interstitial prominence. 2. Cardiomegaly and aortic atherosclerosis.  Electronically signed by: Elsie Gravely MD 11/27/2024 05:03 PM EST RP Workstation: [id number]      Assessment & Plan:    See Problem List for Assessment and Plan of chronic medical problems.         [1]  Current Outpatient Medications on File Prior to Visit  Medication Sig Dispense Refill   albuterol  (VENTOLIN  HFA) 108 (90 Base) MCG/ACT inhaler INHALE 2 PUFFS BY MOUTH EVERY 6 HOURS AS NEEDED FOR WHEEZE OR SHORTNESS OF BREATH 18 each 5   amoxicillin  (AMOXIL ) 500 MG capsule Take 500 mg by mouth daily. 4 tablets prior to dental appointments.     Ascorbic Acid (VITAMIN C) 100 MG tablet Take 100 mg by mouth daily.     augmented betamethasone  dipropionate (DIPROLENE -AF) 0.05 % cream Apply topically as directed.     calcium  citrate (CALCITRATE - DOSED IN MG ELEMENTAL CALCIUM ) 950 (200 Ca) MG tablet Take 200 mg of elemental calcium  by mouth 2 (two) times daily.     Cholecalciferol  (VITAMIN D3) 1000 UNITS CAPS Take 1,000 Units by mouth daily.      clotrimazole -betamethasone  (LOTRISONE ) cream Apply 1 Application topically daily. 30 g 2    Coenzyme Q10 (CO Q 10 PO) Take by mouth daily.     denosumab -bbdz (JUBBONTI ) 60 MG/ML SOSY injection Inject 60 mg into the skin every 6 (six) months. 1 mL 0   dicyclomine  (BENTYL ) 20 MG tablet TAKE 1 TABLET BY MOUTH EVERY 6 HOURS AS NEEDED FOR PAIN 270 tablet 3   ELIQUIS  5 MG TABS tablet TAKE 1 TABLET BY MOUTH TWICE A DAY 60 tablet 5   fexofenadine  (ALLEGRA ) 180 MG tablet Take 1 tablet (180 mg total) by mouth daily. 30 tablet 11   furosemide  (LASIX ) 40 MG tablet TAKE 1 TABLET BY MOUTH DAILY. TAKE AN EXTRA 1/2 TABLET DAILY AS NEEDED 135 tablet 1   ipratropium (ATROVENT ) 0.03 % nasal spray Place 2 sprays into both nostrils every 12 (twelve) hours. 30 mL 12   Lactobacillus (PROBIOTIC ACIDOPHILUS PO) Take 1 capsule by  mouth daily.     losartan  (COZAAR ) 50 MG tablet TAKE 1 TABLET BY MOUTH EVERY DAY 90 tablet 2   metoprolol  tartrate (LOPRESSOR ) 25 MG tablet TAKE 2 TABLETS EVERY MORNING AND 1 TABLET IN THE EVENING 270 tablet 3   omeprazole  (PRILOSEC) 20 MG capsule Take 1 capsule (20 mg total) by mouth daily. 90 capsule 3   Polyethyl Glycol-Propyl Glycol (SYSTANE OP) Apply 1 drop to eye in the morning and at bedtime. 1 drop per eye twice daily.     rosuvastatin  (CRESTOR ) 10 MG tablet TAKE 1 TABLET BY MOUTH EVERY DAY 90 tablet 3   traMADol  (ULTRAM ) 50 MG tablet Take 1 tablet (50 mg total) by mouth every 6 (six) hours as needed. Dr.Ramos (Patient taking differently: Take 50 mg by mouth every 6 (six) hours as needed. Dr.Ramos; pt takes 1 tablet every night, maybe 1 during the day as needed) 30 tablet 0   Current Facility-Administered Medications on File Prior to Visit  Medication Dose Route Frequency Provider Last Rate Last Admin   [START ON 02/22/2025] denosumab -bbdz (JUBBONTI ) injection 60 mg  60 mg Subcutaneous Once        "

## 2024-11-27 ENCOUNTER — Encounter: Payer: Self-pay | Admitting: Internal Medicine

## 2024-11-27 ENCOUNTER — Ambulatory Visit: Payer: Self-pay | Admitting: Internal Medicine

## 2024-11-27 ENCOUNTER — Ambulatory Visit (INDEPENDENT_AMBULATORY_CARE_PROVIDER_SITE_OTHER)

## 2024-11-27 ENCOUNTER — Ambulatory Visit (INDEPENDENT_AMBULATORY_CARE_PROVIDER_SITE_OTHER): Admitting: Internal Medicine

## 2024-11-27 VITALS — BP 122/80 | HR 65 | Temp 98.0°F | Ht 64.0 in | Wt 155.0 lb

## 2024-11-27 DIAGNOSIS — R051 Acute cough: Secondary | ICD-10-CM

## 2024-11-27 DIAGNOSIS — R062 Wheezing: Secondary | ICD-10-CM

## 2024-11-27 DIAGNOSIS — I5033 Acute on chronic diastolic (congestive) heart failure: Secondary | ICD-10-CM | POA: Diagnosis not present

## 2024-11-27 MED ORDER — ALBUTEROL SULFATE (2.5 MG/3ML) 0.083% IN NEBU: 2.5000 mg | INHALATION_SOLUTION | Freq: Four times a day (QID) | RESPIRATORY_TRACT | 2 refills | Status: AC | PRN

## 2024-11-27 NOTE — Patient Instructions (Addendum)
" ° ° ° °  Have a chest xray downstairs    Medications changes include :   albuterol  neb treatment      Return if symptoms worsen or fail to improve.  "

## 2024-11-27 NOTE — Assessment & Plan Note (Signed)
 Acute cough, wheeze ?  Worsened shortness of breath than baseline Leg swelling at baseline No other cold symptoms Viral URI versus heart failure exacerbation, pneumonia less likely Chest x-ray today I do not think she has a bacterial infection-no need for antibiotics, wheeze is minimal so no need for steroids Has difficulty using albuterol  inhaler so we will have her purchase a small neb handheld device and albuterol  solution sent to pharmacy Further treatment based on chest x-ray Monitor symptoms closely and if symptoms worsen may need further treatment

## 2024-11-27 NOTE — Assessment & Plan Note (Signed)
 Acute-new Likely has an element of acute on chronic diastolic heart failure in the setting of persistent atrial fibrillation Has cough, mild wheeze-?  Worsening chronic shortness of breath and edema Symptoms not consistent with bacterial infection-?  Viral URI versus pulmonary edema/heart failure exacerbation Chest x-ray today Above consistent with mild pulmonary edema-take extra half of furosemide  tomorrow Depending on symptoms may need to repeat in the next week or so Monitor symptoms closely

## 2024-11-27 NOTE — Progress Notes (Signed)
 "    HPI: FU CAD and atrial myxoma resection. Echo 4/16 orderded by primary care for cardiomegaly; this revealed normal LV function, left atrial mass; mild MR. Cardiac cath 5/16 showed 80 LAD. Preop carotid dopplers 5/16 with no significant stenosis. Patient had CABG with LIMA to LAD, resection of atrial myxoma and LAA oversewn. Patient had recurrent atrial fibrillation in September 2016. She has been treated with rate controlling medications and anticoagulation. Holter 4/18 showed atrial fibrillation, rate mildly decreased late PM and early AM but otherwise controlled. Echocardiogram October 2023 showed normal LV function, mild left ventricular hypertrophy, mild biatrial enlargement, mild mitral regurgitation, moderate tricuspid regurgitation, dilated ascending aorta at 40 mm.  Since last seen, patient has some dyspnea on exertion but no orthopnea or PND.  Occasional minimal pedal edema.  She denies chest pain or syncope.  Current Outpatient Medications  Medication Sig Dispense Refill   albuterol  (VENTOLIN  HFA) 108 (90 Base) MCG/ACT inhaler INHALE 2 PUFFS BY MOUTH EVERY 6 HOURS AS NEEDED FOR WHEEZE OR SHORTNESS OF BREATH 18 each 5   Ascorbic Acid (VITAMIN C) 100 MG tablet Take 100 mg by mouth daily.     augmented betamethasone  dipropionate (DIPROLENE -AF) 0.05 % cream Apply topically as directed.     calcium  citrate (CALCITRATE - DOSED IN MG ELEMENTAL CALCIUM ) 950 (200 Ca) MG tablet Take 200 mg of elemental calcium  by mouth 2 (two) times daily.     Cholecalciferol  (VITAMIN D3) 1000 UNITS CAPS Take 1,000 Units by mouth daily.      clotrimazole -betamethasone  (LOTRISONE ) cream Apply 1 Application topically daily. 30 g 2   Coenzyme Q10 (CO Q 10 PO) Take by mouth daily.     denosumab -bbdz (JUBBONTI ) 60 MG/ML SOSY injection Inject 60 mg into the skin every 6 (six) months. 1 mL 0   dicyclomine  (BENTYL ) 20 MG tablet TAKE 1 TABLET BY MOUTH EVERY 6 HOURS AS NEEDED FOR PAIN 270 tablet 3   ELIQUIS  5 MG TABS  tablet TAKE 1 TABLET BY MOUTH TWICE A DAY 60 tablet 5   fexofenadine  (ALLEGRA ) 180 MG tablet Take 1 tablet (180 mg total) by mouth daily. 30 tablet 11   furosemide  (LASIX ) 40 MG tablet TAKE 1 TABLET BY MOUTH DAILY. TAKE AN EXTRA 1/2 TABLET DAILY AS NEEDED 135 tablet 1   ipratropium (ATROVENT ) 0.03 % nasal spray Place 2 sprays into both nostrils every 12 (twelve) hours. 30 mL 12   Lactobacillus (PROBIOTIC ACIDOPHILUS PO) Take 1 capsule by mouth daily.     losartan  (COZAAR ) 50 MG tablet TAKE 1 TABLET BY MOUTH EVERY DAY 90 tablet 2   metoprolol  tartrate (LOPRESSOR ) 25 MG tablet TAKE 2 TABLETS EVERY MORNING AND 1 TABLET IN THE EVENING 270 tablet 3   omeprazole  (PRILOSEC) 20 MG capsule Take 1 capsule (20 mg total) by mouth daily. 90 capsule 3   Polyethyl Glycol-Propyl Glycol (SYSTANE OP) Apply 1 drop to eye in the morning and at bedtime. 1 drop per eye twice daily.     rosuvastatin  (CRESTOR ) 10 MG tablet TAKE 1 TABLET BY MOUTH EVERY DAY 90 tablet 3   albuterol  (PROVENTIL ) (2.5 MG/3ML) 0.083% nebulizer solution Take 3 mLs (2.5 mg total) by nebulization every 6 (six) hours as needed for wheezing or shortness of breath. (Patient not taking: Reported on 12/04/2024) 50 mL 2   amoxicillin  (AMOXIL ) 500 MG capsule Take 500 mg by mouth daily. 4 tablets prior to dental appointments. (Patient not taking: Reported on 12/04/2024)     traMADol  (ULTRAM ) 50 MG  tablet Take 1 tablet (50 mg total) by mouth every 6 (six) hours as needed. 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 s/p LIMA-LAD   Diverticulitis 2002   based on CT scan   Diverticulosis    Dr Abran   DVT (deep venous thrombosis) (HCC) 2000   no trigger   Endometriosis    Gastritis 2006   @ End[facility]   GERD (gastroesophageal reflux disease)    Hiatal hernia    Hyperlipidemia    Hypertension    IBS (irritable bowel syndrome)    Ocular hypertension    glaucoma suspect, Dr. Rosan   Skin cancer    Superficial phlebitis     X 2, 1999, 2000   Syncope 11/12/11   in context CAP . Carotid Doppler exam was negative. 2D echocardiogram revealed mild dilation of the left atrium and moderate increase in the systolic pressureof  the pulmonary artery   UTI (lower urinary tract infection) 01/2013   Strep bovis ; PCN sensitive    Past Surgical History:  Procedure Laterality Date   CARDIAC CATHETERIZATION N/A 03/28/2015   Procedure: Left Heart Cath and Coronary Angiography;  Surgeon: Toribio JONELLE Fuel, MD;  Location: [facility];  Service: Cardiovascular;  Laterality: N/A;   CATARACT EXTRACTION Bilateral    CATARACT EXTRACTION W/ INTRAOCULAR LENS IMPLANT     OS; Dr Rosan   COLONOSCOPY  1995, 2002, 2012   diverticulosis; Dr Abran   CORONARY ARTERY BYPASS GRAFT N/A 04/01/2015   Procedure: CORONARY ARTERY BYPASS GRAFTING (CABG), ON PUMP, TIMES ONE, USING LEFT INTERNAL MAMMARY ARTERY;  Surgeon: Maude Fleeta Ochoa, MD;  Location: [facility] OR;  Service: Open Heart Surgery;  Laterality: N/A;   EXCISION OF ATRIAL MYXOMA N/A 04/01/2015   Procedure: EXCISION OF ATRIAL MYXOMA;  Surgeon: Maude Fleeta Ochoa, MD;  Location: [facility] OR;  Service: Open Heart Surgery;  Laterality: N/A;   ORIF ANKLE FRACTURE  11/14/2011   Procedure: OPEN REDUCTION INTERNAL FIXATION (ORIF) ANKLE FRACTURE;  Surgeon: Deward DELENA Schwartz;  Location: [facility] ORS;  Service: Orthopedics;  Laterality: Left;  open reduction internal fixation trimalleolar ankle fracture   TEE WITHOUT  CARDIOVERSION N/A 04/01/2015   Procedure: TRANSESOPHAGEAL ECHOCARDIOGRAM (TEE);  Surgeon: Maude Fleeta Ochoa, MD;  [facility];  Service: Open Heart Surgery;  Laterality: N/A;   TONSILLECTOMY     TOTAL ABDOMINAL HYSTERECTOMY W/ BILATERAL SALPINGOOPHORECTOMY  1980   dysfunctional menses, endometriosis   VARICOSE VEIN SURGERY     1960, 1965, 2009    Social History   Socioeconomic History   Marital status: Married    Spouse name: Elsie Biomedical Scientist)   Number of children: 2   Years of education: Not on file   Highest education level: 12th grade  Occupational History   Occupation: Retired    Associate Professor: RETIRED  Tobacco Use   Smoking status: Never   Smokeless tobacco: Never  Vaping Use  Vaping status: Never Used  Substance and Sexual Activity   Alcohol use: No   Drug use: No   Sexual activity: Not on file  Other Topics Concern   Not on file  Social History Narrative   REG EXERCISEHAD COLONOSCOPY AND ENDOSCOPY DONE DATES UNKNOWN   Lives with husband/2025   Social Drivers of Health   Tobacco Use: Low Risk (12/04/2024)   Patient History    Smoking Tobacco Use: Never    Smokeless Tobacco Use: Never    Passive Exposure: Not on file  Financial Resource Strain: Low Risk (09/24/2024)   Overall Financial Resource Strain (CARDIA)    Difficulty of Paying Living Expenses: Not hard at all  Food Insecurity: No Food Insecurity (09/24/2024)   Epic    Worried About Programme Researcher, Broadcasting/film/video in the Last Year: Never true    Ran Out of Food in the Last Year: Never true  Transportation Needs: No Transportation Needs (09/24/2024)   Epic    Lack of Transportation (Medical): No    Lack of Transportation (Non-Medical): No  Physical Activity: Inactive (09/24/2024)   Exercise Vital Sign    Days of Exercise per Week: 0 days    Minutes of Exercise per Session: Not on file  Stress: No Stress Concern Present (09/24/2024)   [employer] of Occupational Health - Occupational Stress Questionnaire    Feeling  of Stress: Not at all  Social Connections: Moderately Isolated (09/24/2024)   Social Connection and Isolation Panel    Frequency of Communication with Friends and Family: Twice a week    Frequency of Social Gatherings with Friends and Family: More than three times a week    Attends Religious Services: Never    Database Administrator or Organizations: No    Attends Engineer, Structural: Not on file    Marital Status: Married  Catering Manager Violence: Not At Risk (07/03/2024)   Epic    Fear of Current or Ex-Partner: No    Emotionally Abused: No    Physically Abused: No    Sexually Abused: No  Depression (PHQ2-9): Low Risk (09/24/2024)   Depression (PHQ2-9)    PHQ-2 Score: 0  Alcohol Screen: Low Risk (07/03/2024)   Alcohol Screen    Last Alcohol Screening Score (AUDIT): 0  Housing: Low Risk (09/24/2024)   Epic    Unable to Pay for Housing in the Last Year: No    Number of Times Moved in the Last Year: 0    Homeless in the Last Year: No  Utilities: Not At Risk (07/03/2024)   Epic    Threatened with loss of utilities: No  Health Literacy: Adequate Health Literacy (07/03/2024)   B1300 Health Literacy    Frequency of need for help with medical instructions: Never    Family History  Problem Relation Age of Onset   Alzheimer's disease Mother 18       TIAs   Heart attack Father 41   Hyperlipidemia Brother    Hypertension Brother    Hypertension Brother    Prostate cancer Brother 64   Colon cancer Maternal Grandmother    Heart attack Paternal Grandmother 97   Diverticulitis Daughter        S/P colectomy   Diabetes Neg Hx    Stroke Neg Hx    Hypercalcemia Neg Hx    Stomach cancer Neg Hx    Pancreatic cancer Neg Hx    Esophageal cancer Neg Hx     ROS: no fevers or  chills, productive cough, hemoptysis, dysphasia, odynophagia, melena, hematochezia, dysuria, hematuria, rash, seizure activity, orthopnea, PND, claudication. Remaining systems are negative.  Physical  Exam: Well-developed well-nourished in no acute distress.  Skin is warm and dry.  HEENT is normal.  Neck is supple.  Chest is clear to auscultation with normal expansion.  Cardiovascular exam is regular rate and rhythm.  Abdominal exam nontender or distended. No masses palpated. Extremities show no edema. neuro grossly intact  EKG Interpretation Date/Time:  Tuesday December 04 2024 15:49:29 EST Ventricular Rate:  52 PR Interval:    QRS Duration:  128 QT Interval:  494 QTC Calculation: 459 R Axis:   20  Text Interpretation: Marked sinus bradycardia with 1st degree A-V block Right bundle branch block Confirmed by Pietro Rogue ([id number]) on 12/04/2024 3:58:31 PM    A/P  1 paroxysmal atrial fibrillation/flutter-patient remains in sinus rhythm today.  Continue beta-blocker for rate control.  Continue apixaban .   2 history of atrial myxoma resection-no evidence of recurrence on most recent echocardiogram.   3 coronary artery disease-Continue statin.   4 hypertension-blood pressure controlled.  Continue present medications.   5 hyperlipidemia-continue statin.   6 history of tricuspid regurgitation-moderate on most recent echocardiogram.  Will schedule fu study.    7 peripheral edema-continue diuretic.  Rogue Pietro, MD    "

## 2024-12-04 ENCOUNTER — Ambulatory Visit: Attending: Cardiology | Admitting: Cardiology

## 2024-12-04 ENCOUNTER — Encounter: Payer: Self-pay | Admitting: Cardiology

## 2024-12-04 VITALS — BP 132/74 | HR 52 | Ht 64.0 in | Wt 149.4 lb

## 2024-12-04 DIAGNOSIS — E7800 Pure hypercholesterolemia, unspecified: Secondary | ICD-10-CM | POA: Diagnosis not present

## 2024-12-04 DIAGNOSIS — I4821 Permanent atrial fibrillation: Secondary | ICD-10-CM

## 2024-12-04 DIAGNOSIS — I2510 Atherosclerotic heart disease of native coronary artery without angina pectoris: Secondary | ICD-10-CM

## 2024-12-04 DIAGNOSIS — I079 Rheumatic tricuspid valve disease, unspecified: Secondary | ICD-10-CM

## 2024-12-04 DIAGNOSIS — I360 Nonrheumatic tricuspid (valve) stenosis: Secondary | ICD-10-CM

## 2024-12-04 DIAGNOSIS — I10 Essential (primary) hypertension: Secondary | ICD-10-CM | POA: Diagnosis not present

## 2024-12-04 DIAGNOSIS — M7989 Other specified soft tissue disorders: Secondary | ICD-10-CM | POA: Diagnosis not present

## 2024-12-04 NOTE — Patient Instructions (Addendum)
 Medication Instructions:  Continue same medications *If you need a refill on your cardiac medications before your next appointment, please call your pharmacy*  Lab Work: None ordered  Testing/Procedures: Echo   first available   Follow-Up: At [facility], you and your health needs are our priority.  As part of our continuing mission to provide you with exceptional heart care, our providers are all part of one team.  This team includes your primary Cardiologist (physician) and Advanced Practice Providers or APPs (Physician Assistants and Nurse Practitioners) who all work together to provide you with the care you need, when you need it.  Your next appointment:  1 year    Call in Oct to schedule Jan appointment     Provider:  Unice   We recommend signing up for the patient portal called MyChart.  Sign up information is provided on this After Visit Summary.  MyChart is used to connect with patients for Virtual Visits (Telemedicine).  Patients are able to view lab/test results, encounter notes, upcoming appointments, etc.  Non-urgent messages can be sent to your provider as well.   To learn more about what you can do with MyChart, go to [email or web address].

## 2024-12-12 ENCOUNTER — Encounter: Payer: Self-pay | Admitting: Internal Medicine

## 2024-12-17 DIAGNOSIS — R410 Disorientation, unspecified: Secondary | ICD-10-CM | POA: Insufficient documentation

## 2024-12-17 NOTE — Progress Notes (Unsigned)
 "   Subjective:    Patient ID: Valerie Rollins, female    DOB: 1936-06-26, 89 y.o.   MRN: [id number]      HPI Valerie Rollins is here for No chief complaint on file.   For a little while she has been having difficulty with her words and expressing herself.  Over the past week or so been a decline.  She is more confusion and aware that something is wrong.   No medication changes.       Medications and allergies reviewed with patient and updated if appropriate.  Medications Ordered Prior to Encounter[1]  Review of Systems     Objective:  There were no vitals filed for this visit. BP Readings from Last 3 Encounters:  12/04/24 132/74  11/27/24 122/80  09/24/24 130/70   Wt Readings from Last 3 Encounters:  12/04/24 149 lb 6.4 oz (67.8 kg)  11/27/24 155 lb (70.3 kg)  09/24/24 147 lb (66.7 kg)   There is no height or weight on file to calculate BMI.    Physical Exam         Assessment & Plan:    See Problem List for Assessment and Plan of chronic medical problems.         [1]  Current Outpatient Medications on File Prior to Visit  Medication Sig Dispense Refill   albuterol  (PROVENTIL ) (2.5 MG/3ML) 0.083% nebulizer solution Take 3 mLs (2.5 mg total) by nebulization every 6 (six) hours as needed for wheezing or shortness of breath. (Patient not taking: Reported on 12/04/2024) 50 mL 2   albuterol  (VENTOLIN  HFA) 108 (90 Base) MCG/ACT inhaler INHALE 2 PUFFS BY MOUTH EVERY 6 HOURS AS NEEDED FOR WHEEZE OR SHORTNESS OF BREATH 18 each 5   amoxicillin  (AMOXIL ) 500 MG capsule Take 500 mg by mouth daily. 4 tablets prior to dental appointments. (Patient not taking: Reported on 12/04/2024)     Ascorbic Acid (VITAMIN C) 100 MG tablet Take 100 mg by mouth daily.     augmented betamethasone  dipropionate (DIPROLENE -AF) 0.05 % cream Apply topically as directed.     calcium  citrate (CALCITRATE - DOSED IN MG ELEMENTAL CALCIUM ) 950 (200 Ca) MG tablet Take 200 mg of elemental calcium  by mouth 2  (two) times daily.     Cholecalciferol  (VITAMIN D3) 1000 UNITS CAPS Take 1,000 Units by mouth daily.      clotrimazole -betamethasone  (LOTRISONE ) cream Apply 1 Application topically daily. 30 g 2   Coenzyme Q10 (CO Q 10 PO) Take by mouth daily.     denosumab -bbdz (JUBBONTI ) 60 MG/ML SOSY injection Inject 60 mg into the skin every 6 (six) months. 1 mL 0   dicyclomine  (BENTYL ) 20 MG tablet TAKE 1 TABLET BY MOUTH EVERY 6 HOURS AS NEEDED FOR PAIN 270 tablet 3   ELIQUIS  5 MG TABS tablet TAKE 1 TABLET BY MOUTH TWICE A DAY 60 tablet 5   fexofenadine  (ALLEGRA ) 180 MG tablet Take 1 tablet (180 mg total) by mouth daily. 30 tablet 11   furosemide  (LASIX ) 40 MG tablet TAKE 1 TABLET BY MOUTH DAILY. TAKE AN EXTRA 1/2 TABLET DAILY AS NEEDED 135 tablet 1   ipratropium (ATROVENT ) 0.03 % nasal spray Place 2 sprays into both nostrils every 12 (twelve) hours. 30 mL 12   Lactobacillus (PROBIOTIC ACIDOPHILUS PO) Take 1 capsule by mouth daily.     losartan  (COZAAR ) 50 MG tablet TAKE 1 TABLET BY MOUTH EVERY DAY 90 tablet 2   metoprolol  tartrate (LOPRESSOR ) 25 MG tablet TAKE 2 TABLETS EVERY  MORNING AND 1 TABLET IN THE EVENING 270 tablet 3   omeprazole  (PRILOSEC) 20 MG capsule Take 1 capsule (20 mg total) by mouth daily. 90 capsule 3   Polyethyl Glycol-Propyl Glycol (SYSTANE OP) Apply 1 drop to eye in the morning and at bedtime. 1 drop per eye twice daily.     rosuvastatin  (CRESTOR ) 10 MG tablet TAKE 1 TABLET BY MOUTH EVERY DAY 90 tablet 3   traMADol  (ULTRAM ) 50 MG tablet Take 1 tablet (50 mg total) by mouth every 6 (six) hours as needed. Dr.Ramos (Patient not taking: Reported on 12/04/2024) 30 tablet 0   Current Facility-Administered Medications on File Prior to Visit  Medication Dose Route Frequency Provider Last Rate Last Admin   [START ON 02/22/2025] denosumab -bbdz (JUBBONTI ) injection 60 mg  60 mg Subcutaneous Once        "

## 2024-12-19 ENCOUNTER — Ambulatory Visit

## 2024-12-19 ENCOUNTER — Encounter: Payer: Self-pay | Admitting: Internal Medicine

## 2024-12-19 ENCOUNTER — Ambulatory Visit: Admitting: Internal Medicine

## 2024-12-19 VITALS — BP 128/76 | HR 55 | Temp 98.6°F | Wt 142.4 lb

## 2024-12-19 DIAGNOSIS — R8290 Unspecified abnormal findings in urine: Secondary | ICD-10-CM

## 2024-12-19 DIAGNOSIS — R300 Dysuria: Secondary | ICD-10-CM | POA: Diagnosis not present

## 2024-12-19 DIAGNOSIS — I5033 Acute on chronic diastolic (congestive) heart failure: Secondary | ICD-10-CM | POA: Diagnosis not present

## 2024-12-19 DIAGNOSIS — I10 Essential (primary) hypertension: Secondary | ICD-10-CM | POA: Diagnosis not present

## 2024-12-19 DIAGNOSIS — R051 Acute cough: Secondary | ICD-10-CM | POA: Diagnosis not present

## 2024-12-19 DIAGNOSIS — I501 Left ventricular failure: Secondary | ICD-10-CM | POA: Diagnosis not present

## 2024-12-19 DIAGNOSIS — R410 Disorientation, unspecified: Secondary | ICD-10-CM | POA: Diagnosis not present

## 2024-12-19 LAB — COMPREHENSIVE METABOLIC PANEL WITH GFR
ALT: 25 U/L (ref 3–35)
AST: 29 U/L (ref 5–37)
Albumin: 4.5 g/dL (ref 3.5–5.2)
Alkaline Phosphatase: 60 U/L (ref 39–117)
BUN: 29 mg/dL — ABNORMAL HIGH (ref 6–23)
CO2: 33 meq/L — ABNORMAL HIGH (ref 19–32)
Calcium: 10.4 mg/dL (ref 8.4–10.5)
Chloride: 102 meq/L (ref 96–112)
Creatinine, Ser: 0.77 mg/dL (ref 0.40–1.20)
GFR: 69.87 mL/min
Glucose, Bld: 92 mg/dL (ref 70–99)
Potassium: 3.6 meq/L (ref 3.5–5.1)
Sodium: 142 meq/L (ref 135–145)
Total Bilirubin: 0.8 mg/dL (ref 0.2–1.2)
Total Protein: 7.3 g/dL (ref 6.0–8.3)

## 2024-12-19 LAB — CBC WITH DIFFERENTIAL/PLATELET
Basophils Absolute: 0 10*3/uL (ref 0.0–0.1)
Basophils Relative: 0.6 % (ref 0.0–3.0)
Eosinophils Absolute: 0 10*3/uL (ref 0.0–0.7)
Eosinophils Relative: 0.7 % (ref 0.0–5.0)
HCT: 41.8 % (ref 36.0–46.0)
Hemoglobin: 14.3 g/dL (ref 12.0–15.0)
Lymphocytes Relative: 22.3 % (ref 12.0–46.0)
Lymphs Abs: 1.3 10*3/uL (ref 0.7–4.0)
MCHC: 34.2 g/dL (ref 30.0–36.0)
MCV: 90.6 fl (ref 78.0–100.0)
Monocytes Absolute: 0.5 10*3/uL (ref 0.1–1.0)
Monocytes Relative: 8.2 % (ref 3.0–12.0)
Neutro Abs: 4.1 10*3/uL (ref 1.4–7.7)
Neutrophils Relative %: 68.2 % (ref 43.0–77.0)
Platelets: 174 10*3/uL (ref 150.0–400.0)
RBC: 4.62 Mil/uL (ref 3.87–5.11)
RDW: 13 % (ref 11.5–15.5)
WBC: 6 10*3/uL (ref 4.0–10.5)

## 2024-12-19 LAB — POCT URINALYSIS DIPSTICK
Bilirubin, UA: NEGATIVE
Blood, UA: 2
Glucose, UA: NEGATIVE
Ketones, UA: NEGATIVE
Leukocytes, UA: NEGATIVE
Nitrite, UA: NEGATIVE
Protein, UA: NEGATIVE
Spec Grav, UA: 1.01
Urobilinogen, UA: 0.2 U/dL
pH, UA: 7

## 2024-12-19 NOTE — Assessment & Plan Note (Signed)
 Last visit was here with acute cough thought to be fluid overload Improved with extra dose of furosemide  At that time had some pulmonary edema on chest x-ray Does have some bibasilar crackles Chest x-ray today reevaluate

## 2024-12-19 NOTE — Assessment & Plan Note (Signed)
 New Acute-onset of confusion that last for little while that seem to correlate with when she was not taking the furosemide  regularly She is taking the medication regularly now and there has been some improvement ?  Related to fluid overload, infection, noncompliance with other medications, dementia Taking medications regularly Rule out infection-UA, urine culture, chest x-ray Check CBC, CMP, TSH, B12 Monitor She does have an appointment with neurology in the spring-could always request memory evaluation at that time as well

## 2024-12-19 NOTE — Assessment & Plan Note (Signed)
 Resolved Extra furosemide  did help this when she was here couple of weeks ago Weight is down Denies cough, increased leg edema or increased shortness of breath, but he is not a good historian She does have some bibasilar crackles on exam Has been taking furosemide  daily Chest x-ray to reevaluate pulmonary edema that was present 2 weeks ago Continue furosemide  40 mg daily

## 2024-12-19 NOTE — Assessment & Plan Note (Signed)
 Chronic Blood pressure well controlled CMP, CBC Continue losartan 50 mg daily, metoprolol 50 mg in the morning, 25 mg in the evening

## 2024-12-19 NOTE — Patient Instructions (Addendum)
" ° ° °  We will send your urine for a culture.     Blood work was ordered.   Have a chest xray done downstairs.      Medications changes include :   None     Return for follow up as scheduled.  "

## 2024-12-20 ENCOUNTER — Ambulatory Visit: Payer: Self-pay | Admitting: Internal Medicine

## 2024-12-20 LAB — URINE CULTURE

## 2024-12-20 LAB — TSH: TSH: 2.4 u[IU]/mL (ref 0.35–5.50)

## 2024-12-20 LAB — VITAMIN B12: Vitamin B-12: 388 pg/mL (ref 211–911)

## 2024-12-25 ENCOUNTER — Encounter: Payer: Self-pay | Admitting: Podiatry

## 2024-12-26 ENCOUNTER — Encounter: Payer: Self-pay | Admitting: Podiatry

## 2024-12-26 ENCOUNTER — Ambulatory Visit: Admitting: Podiatry

## 2024-12-26 DIAGNOSIS — B351 Tinea unguium: Secondary | ICD-10-CM

## 2024-12-26 DIAGNOSIS — D689 Coagulation defect, unspecified: Secondary | ICD-10-CM

## 2024-12-26 NOTE — Progress Notes (Signed)
"  °  Subjective:  Patient ID: Valerie Rollins, female    DOB: January 17, 1936,   MRN: [id number]  Chief Complaint  Patient presents with   Nail Problem    Trim her nails.  I called her insurance and they said since she's on Eliquis , she can have her nails trimmed as often as she wanted as long as she pays the co-pay.  Can you code it stated she's on Eliquis ?  I explained to her that there is not a separate code stating it.    89 y.o. female presents for concern of thickened elongated and painful nails that are difficult to trim. Also relates some callus area on the ball of her right foot. Requesting to have them trimmed today. Patient is on eliquis     PCP:  Geofm Glade PARAS, MD    . Denies any other pedal complaints. Denies n/v/f/c.   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 s/p LIMA-LAD   Diverticulitis 2002   based on CT scan   Diverticulosis    Dr Abran   DVT (deep venous thrombosis) (HCC) 2000   no trigger   Endometriosis    Gastritis 2006   @ Endo   GERD (gastroesophageal reflux disease)    Hiatal hernia    Hyperlipidemia    Hypertension    IBS (irritable bowel syndrome)    Ocular hypertension    glaucoma suspect, Dr. Rosan   Skin cancer    Superficial phlebitis     X 2, 1999, 2000   Syncope 11/12/11   in context CAP . Carotid Doppler exam was negative. 2D echocardiogram revealed mild dilation of the left atrium and moderate increase in the systolic pressureof  the pulmonary artery   UTI (lower urinary tract infection) 01/2013   Strep bovis ; PCN sensitive    Objective:  Physical Exam: Vascular: DP/PT pulses 2/4 bilateral. CFT <3 seconds. Absent hair growth on digits. Edema noted to bilateral lower extremities. Xerosis  noted bilaterally.  Skin. No lacerations or abrasions bilateral feet. Nails 1-5 bilateral  are thickened discolored and elongated with subungual debris. Hyperkeratotic lesion noted sub first metatarsal on the right foot.  Musculoskeletal: MMT 5/5 bilateral lower extremities in DF, PF, Inversion and Eversion. Deceased ROM in DF of ankle joint.  Neurological: Sensation intact to light touch. Protective intact diminished bilateral.    Assessment:   1. Pain due to onychomycosis of toenails of both feet   2. Coagulation defect        Plan:  Patient was evaluated and treated and all questions answered. -ABN signed 12/26/24. Here early for trimming.  -Mechanically debrided all nails 1-5 bilateral using sterile nail nipper and filed with dremel without incident  -Right second digit well healed.  -Answered all patient questions -Patient to return  in 3 months for at risk foot care -Patient advised to call the office if any problems or questions arise in the meantime.   Asberry Failing, DPM   "

## 2025-01-07 ENCOUNTER — Ambulatory Visit (HOSPITAL_COMMUNITY)

## 2025-02-20 ENCOUNTER — Ambulatory Visit: Admitting: Podiatry

## 2025-04-01 ENCOUNTER — Ambulatory Visit: Admitting: Internal Medicine

## 2025-04-02 ENCOUNTER — Ambulatory Visit: Admitting: Neurology

## 2025-07-04 ENCOUNTER — Ambulatory Visit
